# Patient Record
Sex: Female | Born: 1940 | Race: White | Hispanic: No | Smoking: Never smoker
Health system: Southern US, Community
[De-identification: ages and names within clinical notes are randomized; demographics above are authoritative.]

## PROBLEM LIST (undated history)

## (undated) ENCOUNTER — Ambulatory Visit (HOSPITAL_COMMUNITY): Admission: EM | Payer: Medicaid Other

## (undated) DIAGNOSIS — C50919 Malignant neoplasm of unspecified site of unspecified female breast: Secondary | ICD-10-CM

## (undated) DIAGNOSIS — M109 Gout, unspecified: Secondary | ICD-10-CM

## (undated) DIAGNOSIS — I6529 Occlusion and stenosis of unspecified carotid artery: Secondary | ICD-10-CM

## (undated) DIAGNOSIS — I10 Essential (primary) hypertension: Secondary | ICD-10-CM

## (undated) DIAGNOSIS — E119 Type 2 diabetes mellitus without complications: Secondary | ICD-10-CM

## (undated) DIAGNOSIS — M8580 Other specified disorders of bone density and structure, unspecified site: Secondary | ICD-10-CM

## (undated) DIAGNOSIS — E7800 Pure hypercholesterolemia, unspecified: Secondary | ICD-10-CM

## (undated) DIAGNOSIS — E538 Deficiency of other specified B group vitamins: Secondary | ICD-10-CM

## (undated) DIAGNOSIS — M4800 Spinal stenosis, site unspecified: Secondary | ICD-10-CM

## (undated) DIAGNOSIS — I5031 Acute diastolic (congestive) heart failure: Secondary | ICD-10-CM

## (undated) DIAGNOSIS — H3320 Serous retinal detachment, unspecified eye: Secondary | ICD-10-CM

## (undated) DIAGNOSIS — G459 Transient cerebral ischemic attack, unspecified: Secondary | ICD-10-CM

## (undated) DIAGNOSIS — G709 Myoneural disorder, unspecified: Secondary | ICD-10-CM

## (undated) DIAGNOSIS — N189 Chronic kidney disease, unspecified: Secondary | ICD-10-CM

## (undated) HISTORY — DX: Spinal stenosis, site unspecified: M48.00

## (undated) HISTORY — PX: BREAST LUMPECTOMY: SHX2

## (undated) HISTORY — PX: BREAST EXCISIONAL BIOPSY: SUR124

## (undated) HISTORY — DX: Malignant neoplasm of unspecified site of unspecified female breast: C50.919

## (undated) HISTORY — PX: EYE SURGERY: SHX253

## (undated) HISTORY — PX: ANKLE SURGERY: SHX546

## (undated) HISTORY — DX: Gout, unspecified: M10.9

## (undated) HISTORY — PX: SHOULDER ARTHROSCOPY: SHX128

## (undated) HISTORY — DX: Occlusion and stenosis of unspecified carotid artery: I65.29

## (undated) HISTORY — PX: WRIST ARTHROPLASTY: SHX1088

---

## 1997-10-14 ENCOUNTER — Ambulatory Visit (HOSPITAL_COMMUNITY): Admission: RE | Admit: 1997-10-14 | Discharge: 1997-10-14 | Payer: Self-pay | Admitting: *Deleted

## 2000-03-13 ENCOUNTER — Encounter: Payer: Self-pay | Admitting: *Deleted

## 2000-03-13 ENCOUNTER — Ambulatory Visit (HOSPITAL_COMMUNITY): Admission: RE | Admit: 2000-03-13 | Discharge: 2000-03-13 | Payer: Self-pay | Admitting: *Deleted

## 2000-04-02 ENCOUNTER — Encounter: Payer: Self-pay | Admitting: *Deleted

## 2000-04-02 ENCOUNTER — Encounter: Admission: RE | Admit: 2000-04-02 | Discharge: 2000-04-02 | Payer: Self-pay | Admitting: *Deleted

## 2001-04-30 ENCOUNTER — Encounter: Payer: Self-pay | Admitting: *Deleted

## 2001-04-30 ENCOUNTER — Ambulatory Visit (HOSPITAL_COMMUNITY): Admission: RE | Admit: 2001-04-30 | Discharge: 2001-04-30 | Payer: Self-pay | Admitting: *Deleted

## 2001-08-27 ENCOUNTER — Encounter: Payer: Self-pay | Admitting: *Deleted

## 2001-08-27 ENCOUNTER — Emergency Department (HOSPITAL_COMMUNITY): Admission: EM | Admit: 2001-08-27 | Discharge: 2001-08-27 | Payer: Self-pay | Admitting: *Deleted

## 2002-05-01 ENCOUNTER — Ambulatory Visit (HOSPITAL_COMMUNITY): Admission: RE | Admit: 2002-05-01 | Discharge: 2002-05-01 | Payer: Self-pay | Admitting: *Deleted

## 2002-05-01 ENCOUNTER — Encounter: Payer: Self-pay | Admitting: *Deleted

## 2003-06-23 ENCOUNTER — Ambulatory Visit (HOSPITAL_COMMUNITY): Admission: RE | Admit: 2003-06-23 | Discharge: 2003-06-23 | Payer: Self-pay | Admitting: Family Medicine

## 2003-06-23 ENCOUNTER — Ambulatory Visit: Admission: RE | Admit: 2003-06-23 | Discharge: 2003-06-23 | Payer: Self-pay | Admitting: Family Medicine

## 2003-06-24 ENCOUNTER — Ambulatory Visit (HOSPITAL_COMMUNITY): Admission: RE | Admit: 2003-06-24 | Discharge: 2003-06-24 | Payer: Self-pay | Admitting: Family Medicine

## 2004-06-27 ENCOUNTER — Ambulatory Visit (HOSPITAL_COMMUNITY): Admission: RE | Admit: 2004-06-27 | Discharge: 2004-06-27 | Payer: Self-pay | Admitting: Family Medicine

## 2004-07-19 ENCOUNTER — Encounter: Admission: RE | Admit: 2004-07-19 | Discharge: 2004-07-19 | Payer: Self-pay | Admitting: Family Medicine

## 2004-08-26 ENCOUNTER — Inpatient Hospital Stay (HOSPITAL_COMMUNITY): Admission: EM | Admit: 2004-08-26 | Discharge: 2004-09-01 | Payer: Self-pay | Admitting: *Deleted

## 2004-09-01 ENCOUNTER — Inpatient Hospital Stay
Admission: RE | Admit: 2004-09-01 | Discharge: 2004-09-12 | Payer: Self-pay | Admitting: Physical Medicine & Rehabilitation

## 2004-09-01 ENCOUNTER — Ambulatory Visit: Payer: Self-pay | Admitting: Physical Medicine & Rehabilitation

## 2004-11-28 ENCOUNTER — Encounter: Admission: RE | Admit: 2004-11-28 | Discharge: 2004-11-28 | Payer: Self-pay | Admitting: Endocrinology

## 2004-12-01 ENCOUNTER — Encounter (INDEPENDENT_AMBULATORY_CARE_PROVIDER_SITE_OTHER): Payer: Self-pay | Admitting: Specialist

## 2004-12-01 ENCOUNTER — Ambulatory Visit (HOSPITAL_COMMUNITY): Admission: RE | Admit: 2004-12-01 | Discharge: 2004-12-01 | Payer: Self-pay | Admitting: Gastroenterology

## 2005-03-14 ENCOUNTER — Encounter: Admission: RE | Admit: 2005-03-14 | Discharge: 2005-05-03 | Payer: Self-pay | Admitting: Specialist

## 2005-07-10 ENCOUNTER — Encounter: Admission: RE | Admit: 2005-07-10 | Discharge: 2005-07-10 | Payer: Self-pay | Admitting: Family Medicine

## 2005-08-28 ENCOUNTER — Ambulatory Visit (HOSPITAL_COMMUNITY): Admission: RE | Admit: 2005-08-28 | Discharge: 2005-08-28 | Payer: Self-pay | Admitting: Specialist

## 2006-07-11 ENCOUNTER — Encounter: Admission: RE | Admit: 2006-07-11 | Discharge: 2006-07-11 | Payer: Self-pay | Admitting: Family Medicine

## 2006-10-11 ENCOUNTER — Other Ambulatory Visit: Admission: RE | Admit: 2006-10-11 | Discharge: 2006-10-11 | Payer: Self-pay | Admitting: Family Medicine

## 2007-07-17 ENCOUNTER — Encounter: Admission: RE | Admit: 2007-07-17 | Discharge: 2007-07-17 | Payer: Self-pay | Admitting: Endocrinology

## 2008-03-03 ENCOUNTER — Encounter: Admission: RE | Admit: 2008-03-03 | Discharge: 2008-03-03 | Payer: Self-pay | Admitting: Family Medicine

## 2008-03-03 ENCOUNTER — Ambulatory Visit: Payer: Self-pay | Admitting: Vascular Surgery

## 2008-03-03 ENCOUNTER — Encounter (INDEPENDENT_AMBULATORY_CARE_PROVIDER_SITE_OTHER): Payer: Self-pay | Admitting: Family Medicine

## 2008-03-03 ENCOUNTER — Ambulatory Visit (HOSPITAL_COMMUNITY): Admission: RE | Admit: 2008-03-03 | Discharge: 2008-03-03 | Payer: Self-pay | Admitting: Family Medicine

## 2008-08-17 ENCOUNTER — Encounter: Admission: RE | Admit: 2008-08-17 | Discharge: 2008-08-17 | Payer: Self-pay | Admitting: Family Medicine

## 2009-09-01 ENCOUNTER — Encounter: Admission: RE | Admit: 2009-09-01 | Discharge: 2009-09-01 | Payer: Self-pay | Admitting: Family Medicine

## 2010-05-10 ENCOUNTER — Other Ambulatory Visit: Admission: RE | Admit: 2010-05-10 | Discharge: 2010-05-10 | Payer: Self-pay | Admitting: Family Medicine

## 2010-06-12 HISTORY — PX: HEEL SPUR SURGERY: SHX665

## 2010-07-02 ENCOUNTER — Other Ambulatory Visit: Payer: Self-pay | Admitting: Family Medicine

## 2010-07-02 DIAGNOSIS — Z1231 Encounter for screening mammogram for malignant neoplasm of breast: Secondary | ICD-10-CM

## 2010-09-09 ENCOUNTER — Ambulatory Visit
Admission: RE | Admit: 2010-09-09 | Discharge: 2010-09-09 | Disposition: A | Discharge status: Home / Self Care | Payer: Medicare Other | Source: Ambulatory Visit | Attending: Family Medicine | Admitting: Family Medicine

## 2010-09-09 DIAGNOSIS — Z1231 Encounter for screening mammogram for malignant neoplasm of breast: Secondary | ICD-10-CM

## 2010-09-13 ENCOUNTER — Other Ambulatory Visit: Payer: Self-pay | Admitting: Family Medicine

## 2010-09-13 DIAGNOSIS — R928 Other abnormal and inconclusive findings on diagnostic imaging of breast: Secondary | ICD-10-CM

## 2010-09-19 ENCOUNTER — Ambulatory Visit
Admission: RE | Admit: 2010-09-19 | Discharge: 2010-09-19 | Disposition: A | Discharge status: Home / Self Care | Payer: Medicare Other | Source: Ambulatory Visit | Attending: Family Medicine | Admitting: Family Medicine

## 2010-09-19 DIAGNOSIS — R928 Other abnormal and inconclusive findings on diagnostic imaging of breast: Secondary | ICD-10-CM

## 2010-10-28 NOTE — Op Note (Signed)
NAME:  Sabrina Baker, Sabrina Baker NO.:  [id number]   MEDICAL RECORD NO.:  [id number]          PATIENT TYPE:  AMB   LOCATION:  ENDO                         FACILITY:  [facility]   PHYSICIAN:  Bernette Redbird, M.D.   DATE OF BIRTH:  August 28, 1940   DATE OF PROCEDURE:  12/01/2004  DATE OF DISCHARGE:                                 OPERATIVE REPORT   PROCEDURE:  Colonoscopy.   INDICATION:  A 70 year old diabetic female with iron-deficiency anemia  despite heme-negative stool on at least two occasions. Endoscopy basically  negative.   FINDINGS:  Normal exam to the terminal ileum.   PROCEDURE:  The nature, purpose and risks of the procedure had been  discussed with the patient who provided written consent. Sedation was  fentanyl 60 mcg and Versed 7.5 mg IV without arrhythmias or desaturation for  this procedure and the upper endoscopy which preceded it. The Olympus  standard pediatric video colonoscope was easily advanced around the colon to  the cecum and the tip was nubbed into the orifice of the terminal ileum. The  mucosa looked normal. Pullback was then performed. The quality of prep was  excellent and it was felt that all areas were well seen. The appendiceal  orifice was definitely identified.   This was a normal examination. No polyps, cancer, colitis, vascular  malformations or diverticulosis were noted. Retroflexion in the rectum and  pullout through the anal canal demonstrated moderate internal hemorrhoids.  Reinspection of the rectum was unremarkable. No biopsies were obtained. The  patient tolerated the procedure well and there no apparent complications.   IMPRESSION:  Normal exam. No source of iron deficiency anemia endoscopically  evident (280.1).   PLAN:  Consider sigmoidoscopic evaluation in five years for ongoing colon  cancer screening. Concerning the iron deficiency anemia, if the hemoglobin  does not respond appropriately to iron supplementation, consider  possibly  doing a capsule endoscopy of the small bowel.       RB/MEDQ  D:  12/01/2004  T:  12/01/2004  Job:  [id number]   cc:   Stacie Acres. White, M.D.  [address]  [place]  [place] [address]  Fax: [phone]

## 2010-10-28 NOTE — Op Note (Signed)
NAME:  Sabrina Baker, Sabrina Baker NO.:  [id number]   MEDICAL RECORD NO.:  [id number]          PATIENT TYPE:  INP   LOCATION:  0480                         FACILITY:  [facility]   PHYSICIAN:  Kerrin Champagne, M.D.   DATE OF BIRTH:  1940/07/09   DATE OF PROCEDURE:  08/27/2004  DATE OF DISCHARGE:                                 OPERATIVE REPORT   PREOPERATIVE DIAGNOSES:  1.  Right comminuted humeral neck fracture with nondisplaced comminuted      greater tuberosity fracture and a displaced humeral neck fracture.  2.  Left comminuted intra-articular distal radius fracture with associated      ulnar styloid fracture.   POSTOPERATIVE DIAGNOSES:  1.  Right comminuted humeral neck fracture with nondisplaced comminuted      greater tuberosity fracture and a displaced humeral neck fracture.  2.  Left comminuted intra-articular distal radius fracture with associated      ulnar styloid fracture.   PROCEDURE:  1.  Open reduction internal fixation of right humeral neck fracture with a      Synthes short humeral locking plate with four proximal cancellus screws      and three distal cortical screws.  2.  Open reduction internal fixation left distal radius fracture with a      Synthes 8-hole volar locking plate with allograft bone graft and three      distal locking screws.   SURGEON:  Dr. Vira Browns.   ASSISTANT:  Maud Deed, PAC.   ANESTHESIA:  GOT, Dr. Shireen Quan.   ESTIMATED BLOOD LOSS:  100 mL.   DRAINS:  Foley to straight drain, TLS drain left forearm, 7-French   COMPLICATIONS:  None.   BRIEF CLINICAL HISTORY:  This is a 70 year old female with a history of  diabetes, newly recognized anemia, with a hemoglobin of 8.0.  She was seen  in the emergency room after having fallen while walking down her front four  steps, missing the last step, falling and landing on her right shoulder and  left wrist.  Deformity of the left wrist, pain, swelling in the right  shoulder, and the  inability to use her right arm.  Plain radiographs  demonstrate a comminuted right humeral neck fracture with displacement of  the humeral neck and a nondisplaced greater tuberosity fracture.  Radiographs of the left wrist demonstrate a comminuted intra-articular  distal radius fracture.  Following reduction, she had wide displacement of  the intra-articular fracture fragments greater than 3-4 mm.  She is brought  to the operating room to undergo an ORIF of the right humeral neck fracture  with the use of plates and screws to fix this area and ORIF of the left  distal radius fracture with bone grafting if necessary.   INTRAOPERATIVE FINDINGS:  As above.   DESCRIPTION OF PROCEDURE:  After adequate general anesthesia, the patient in  a Schlein shoulder frame elevated, a Foley catheter already placed.  She had  standard preoperative antibiotics of Ancef.  She was draped in the usual  manner for the right shoulder.  Iodine and Vi-Drape was used.  The  incision  for the right shoulder was a standard deltopectoral approach to the right  anterior shoulder, incision in line with the coronoid process, estimated  coracoid process proximally, then aligned with the deltoid tuberosity over  the proximal lateral arm.  Through the skin and subcutaneous layers with a  total length of about 17 cm.  The incision carried to the superficial  fascial layer overlying the deltopectoral groove.  This was then spread to  the clavipectoralis fascia.  This was then incised in line with the skin  incision and the fracture fragments of any identified.  Subperiosteal  dissection then used the patient carefully identify the fracture fragment  pattern and a periosteal elevator used to carefully free up the muscles off  of the proximal humerus to allow for retraction on the fracture fragments in  order to reduce and debride the fracture fragments of interposed soft  tissue.  Curette was used for this purpose.  The greater  tuberosity fracture  fragment was felt to require some stabilization, so then a #5 fiber wire was  passed in a horizontal mattress-type fashion through the anterior portion of  the greater tuberosity fracture fragment, fixing it using the cerclage-type  technique anteriorly.  With this then reduced, the major humeral neck  fracture region was able to be reduced by grasping the distal fracture  fragment of the humeral shaft and manipulating it laterally, reducing it  into place.  A short humeral locking plate was then carefully aligned  against the lateral anterior aspect of the humeral neck in line with the  greater tuberosity. Using the superior pin, this was pinned into place and  distally was held in place with a verbuge bone-holding forceps.   Drill holes were then placed distally, locking the plate to the distal  fracture fragments.  Drill holes were then placed in each of the four  proximal holes of the plate using the centering guide provided.  And then  the cancellus screws, 4 in total were then placed into the proximal portion  of the plate, fixing the plate to the humeral head.  C-arm fluoro was used  during the case to ascertain correct position alignment of each of the  screws placed and to ensure that now the screws were allowed to be intra-  articular in their position. When this was completed, permanent images were  obtained in the AP and lateral planes for documentation purposes.  Irrigation was then performed at the fracture site.   Following irrigation, the soft tissues allowed to fall back into place.  There was no active bleeding present at this point, so the subcu layers were  then carefully approximated after approximating the superficial fascial  layer of the deltopectoral groove.  The subcu layers approximated with  interrupted 2-0 Vicryl sutures.  The deltopectoral groove approximated without interrupted 0 Vicryl sutures, and the skin was then closed with   stainless steel staples.  The right shoulder then dressed with Adaptic 4x4s,  ABD pad, ABD pad placed into the right axillary area, the incision dressing  then affixed to the skin with Hypafix tape.  The patient then placed into a  right shoulder immobilizer, the head returned to a supine position.  After  careful maintenance of the airway, the operating room table was then rotated  to allow for the left arm to be brought out to the side using radiopaque  hand table.  Left upper extremity then had a tourniquet placed about left  upper arm  and was then prepped with DuraPrep solution from the fingertips to  the left elbow and above the elbow to the tourniquet level.  Draped in the  usual manner.   The left upper extremity was then marked out for a volar approach to the  left distal forearm, and this would be along the patient's flexor carpi  radialis longus tendon.  The incision made along this line after first  elevation, the arm exsanguination with Esmarch bandage.  Tourniquet inflated  to 120 mmHg, the incision through skin and subcu layers overlying the  patient's extensor carpi radialis longus, down through skin and to the  peritenon enveloping this tendon.  The peritenon was then incised.  Tendon  then subluxed ulnarward.  The patient's pronator quadratus then incised over  the distal portions of the radius and a cuff of tissue preserved over the  radial aspect of the incision for reapproximation later.  Pronator quadratus  then carefully elevated off the volar surface of the distal radius,  including distal radial fracture fragments and the distal radius fragment  proximal to the fracture site.  When this was completed, then retraction was  able to be carried out.  The fingers were placed into sterile Chinese finger  traps with 5 pound weight off the end of the table using a sterile  technique.  Using the mini C-arm, then fracture was reduced.   Bone graft was then applied to the  fracture site by using a freer elevator  within the fracture site to open it up.  Then portions of cortical cancellus  chips of bone graft were placed within the fracture site in order to bone  graft the area of impacted fracture dorsally.  A significant amount of bone  graft was used; almost a full teaspoon and half was placed into the fracture  site.  With this, then the fracture was manipulated, and a Steinmann pin  0.625 was then driven through the radial styloid, fixing the radial styloid  and reducing the intra-articular fracture fragment.  With the fracture  reduced, then an 8-hole volar buttress plate was then carefully placed over  the distal portion of the radius and aligned appropriately.  Drill hole then  placed into the adjustment slot, into the radius proximal to the fracture  site after adjusting the plate distally.  Screw was then used to anchor the proximal portion of the plate.  Drill holes were then introduced, using the  second from the most radial aspect of the distal locking screw holes.  Using  the appropriate drill sleeve, drilling with the 2.0 drill and then using the  appropriate size locking the screws, a screw was placed into the radial  styloid, obtaining excellent purchase here.  Two additional screws were then  placed into the mid and lateral portions of the distal radius, grasping the  lunate fossa fracture fragments and reducing them quite nicely.  This  completed, permanent images were obtained using the mini C-arm.  The two  additional cortical screws were placed in the proximal portion of the plate,  fixing it to the proximal radius fracture fragments.  This completed,  permanent images again obtained in both AP and lateral planes, demonstrating  good alignment and position here.  Bending the plate over the radial aspect,  a pair of specially designed pliers by [employer] was used to carefully bend  the plate to contour it to the distal radius, so it was not  prominent.  Following this, then tourniquet was  released and bleeders controlled using  bipolar electrocautery.  The pronator quadratus muscle was then approximated  as best as possible over the plate from ulnar to radius, radial, and  approximated with 2-0 Vicryl sutures.  A 7-French TLS drain exiting out the  volar aspect of the forearm proximally was then placed in the depth of the  incision.  Subcu layers were then approximated with interrupted 3-0 Vicryl  sutures and the skin closed with stainless steel staples.  Adaptic 4x4s held  to the volar forearm using sterile Webril.  Then a well-padded sugar-tong  splint was applied to the left forearm.  Tourniquet was released with a  total tourniquet time of 57 minutes.  The patient had release of the Chinese  finger traps before closing the incision.  The pin used for temporary  fixation of the distal radius fracture site was removed at the end of the  case.  The patient was then reactivated, extubated, and returned to the  recovery room in satisfactory condition.  All instrument and sponge counts  were correct.      JEN/MEDQ  D:  08/27/2004  T:  08/27/2004  Job:  [id number]

## 2010-10-28 NOTE — Consult Note (Signed)
NAME:  Sabrina Baker, Sabrina Baker NO.:  [id number]   MEDICAL RECORD NO.:  [id number]          PATIENT TYPE:  INP   LOCATION:  0101                         FACILITY:  [facility]   PHYSICIAN:  Deirdre Peer. Polite, M.D. DATE OF BIRTH:  1940-12-20   DATE OF CONSULTATION:  DATE OF DISCHARGE:                                   CONSULTATION   CHIEF COMPLAINT:  Fall.   HISTORY OF PRESENT ILLNESS:  A 70 year old female with a known history of  diabetes who presents to the ED after sustaining a fall on an outpatient  basis.  Patient has been seen by an orthopedist secondary to multiple  fractures, which include right proximal humerus fracture and a left  comminuted wrist fracture.  On further evaluation, the patient was found to  have significant anemia and a hemoglobin of 8.  Therefore, [facility] were called for further evaluation.  At the time of my  evaluation, the patient is alert and oriented x3.  Patient does not recall  any history of anemia.  Denies any hematemesis, hemoptysis.  No blood per  rectum.  No black stools.  Patient does admit to having a history of  menorrhagia in the past; however, has had a hysterectomy in 1973 secondary  to that problem.  Patient does remember taking any iron supplements.  Again,  patient denies any history of blood per rectum and does not know of having a  history of anemia.  Patient does admit to having some NSAIDs use, quantity  twice daily, over the last week and since then probably once a week for  ankle discomfort.  Patient is being admitted by orthopedist for management  and treatment of fractures.   PAST MEDICAL HISTORY:  As stated above.  Significant for diabetes, chronic  lower extremity edema by history; however, on exam, there is no edema.  Hypertension.   MEDICATIONS ON ADMISSION:  1.  Glucotrol XL 10 mg b.i.d.  2.  Glucophage 1 gm b.i.d.  3.  Avandia 4 mg q.d.  4.  Diovan 80 mg q.d.  5.  Lasix 80 mg q.d.  6.  Cardura 10/20  mg q.d.  7.  Aspirin 81 mg q.d.   SOCIAL HISTORY:  Negative for tobacco, alcohol, or drugs.   PAST SURGICAL HISTORY:  Significant for hysterectomy in 1973.   FAMILY HISTORY:  Noncontributory.   ALLERGIES:  No known drug allergies.   REVIEW OF SYSTEMS:  As stated in the HPI.  Patient denies any chest pain,  shortness of breath.  No nausea or vomiting.  No diarrhea.  No constipation.  No palpitations.  No weight loss.  No weight gain.   PHYSICAL EXAMINATION:  VITALS:  Temp 97.7, BP 147/64, pulse 79, respiratory  rate 18, sat of 96%.  HEENT:  Significant for pale sclerae.  No oral lesions.  NECK:  No nodes.  No JVD.  CHEST:  Clear to auscultation bilaterally.  CARDIOVASCULAR:  Regular S2.  No S3.  There is a systolic murmur in the left  sternal border, probably flow murmur.  ABDOMEN:  Soft and nontender.  No hepatosplenomegaly.  RECTAL:  Heme negative.  EXTREMITIES:  No edema.  Pale nail beds.  NEUROLOGIC:  Nonfocal.   DATA:  CBC:  White count 13.5, hemoglobin 8, hematocrit 24, MCV 74.4, RDW  17.7, platelets 551, neutrophil count 93%, INR 0.8.  Sodium 131, potassium  3.8, chloride 100, carbon dioxide 26.  BUN 16, creatinine 1.1, calcium 7.8,  total protein 5.8, albumin 2.7.  AST and ALT 24 and 14, respectively.  UA  specific gravity 1.007, LE and nitrite negative.   X-RAY:  Imaging shows mildly displaced comminuted fracture of the proximal  right humerus.  Also angulated Colles fracture, nondisplaced fracture of the  ulnar styloid of the left wrist.   Chest x-ray:  Mild basilar atelectasis.   ASSESSMENT:  1.  Proximal humeral fracture and left wrist fracture.  Treatment per ortho.  2.  Anemia with low MCV.  Patient is status post hysterectomy in 1973      secondary to menorrhagia.  Patient denies any known history of anemia.  3.  Diabetes.  4.  Lower extremity edema by history; however, none on exam.   Recommend patient be typed and crossed 2 units packed red blood cells  for  transfusion.  Check TSH. Check iron studies, B12 and folate.  Heme-check  stools.  Obtain chest x-ray, which has been done.  PAS hose and PPI.  Will  make further recommendations as deemed necessary.  Patient may also need  iron supplementation.      RDP/MEDQ  D:  08/26/2004  T:  08/26/2004  Job:  [id number]

## 2010-10-28 NOTE — Op Note (Signed)
NAME:  Sabrina, Baker NO.:  [id number]   MEDICAL RECORD NO.:  [id number]          PATIENT TYPE:  AMB   LOCATION:  ENDO                         FACILITY:  [facility]   PHYSICIAN:  Bernette Redbird, M.D.   DATE OF BIRTH:  [birth year]   DATE OF PROCEDURE:  12/01/2004  DATE OF DISCHARGE:                                 OPERATIVE REPORT   PROCEDURE:  Upper endoscopy with biopsies.   INDICATIONS:  A 70 year old diabetic patient with iron deficiency anemia,  but heme-negative stool on at least two occasions.   FINDINGS:  Minimal erythematous change in the antrum of the stomach,  possibly related to aspirin therapy. No erosions or ulcers seen.   PROCEDURE:  The nature, purpose, and risks of the procedure been discussed  with the patient, who provided written consent. Sedation was fentanyl 40 mcg  and Versed 4 milligrams IV without arrhythmias or desaturation. The Olympus  video endoscope was passed under direct vision. The vocal cords looked  completely normal. The esophagus was readily entered and had normal mucosa  without evidence of reflux esophagitis, Barrett's esophagus, varices,  infection or neoplasia and no ring stricture or hiatal hernia was  appreciated. The stomach contained no significant residual. The antrum of  the stomach had a few mucosal hemorrhages but no erosions, just a little bit  of faint erythema but no ulcers or significant gastritis, no polyps or  masses. Retroflexed viewing of the cardia was normal and the pylorus,  duodenal bulb and second duodenum looked normal. The mucosal biopsies of the  second duodenum were obtained to rule out celiac disease prior to removal of  the scope. The patient tolerated the procedure well and there no apparent  complications.   IMPRESSION:  Essentially normal endoscopy with minimal antral erythema  consistent with history of aspirin exposure, but nothing to account for the  patient's iron-deficiency anemia  (280.1).   PLAN:  Proceed to colonoscopic evaluation.       RB/MEDQ  D:  12/01/2004  T:  12/01/2004  Job:  [id number]   cc:   Stacie Acres. White, M.D.  [address]  [place]  [place] [address]  Fax: [phone]

## 2010-10-28 NOTE — Op Note (Signed)
NAME:  Sabrina Baker, Sabrina Baker NO.:  [id number]   MEDICAL RECORD NO.:  [id number]          PATIENT TYPE:  AMB   LOCATION:  SDS                          FACILITY:  [facility]   PHYSICIAN:  Kerrin Champagne, M.D.   DATE OF BIRTH:  1941/02/16   DATE OF PROCEDURE:  08/28/2005  DATE OF DISCHARGE:  08/28/2005                                 OPERATIVE REPORT   PREOPERATIVE DIAGNOSIS:  Right humeral neck fracture, comminuted, status  post open reduction and internal fixation with a Synthes locking plate with  two proximal locking screws penetrating the anterior joint surface.   POSTOPERATIVE DIAGNOSIS:  Right humeral neck fracture, comminuted, status  post open reduction and internal fixation with a Synthes locking place with  two proximal locking screws penetrating the anterior joint surface.   PROCEDURE:  Removal of two proximal locking screws, right humeral neck ORIF.   SURGEON:  Kerrin Champagne, MD.   ASSISTANT:  Wende Neighbors, PA-C.   ANESTHESIA:  GOT, Dr. Sheldon Silvan, supplemented with local infiltration with  Marcaine, half-percent, with 1:200,000 epinephrine, 10 ml.   BRIEF CLINICAL HISTORY:  This patient is a 70 year old, right hand dominant  female.  She fell nearly a year ago sustaining injuries to her left distal  radius and right proximal humerus, a three-part humeral neck fracture able  to treated with an open reduction and internal fixation with a Synthes  locking plate, and a left distal radius fracture treated with volar buttress  plate and bone grafting.  The patient did well.  She has a history of  diabetes, bilateral Charcot foot deformities, eventually placed on insulin.  She went on to heal both her right humeral neck fracture and left distal  radius fracture.  Followup radiographs, however, demonstrate that there has  been settling in the fracture site of the proximal humerus and the two  proximal locking screws used for internal fixation of the right  proximal  humeral neck are now protruding through the anterior surface of the humeral  head in a position that could impinge on the glenohumeral joint.  She was  brought to the operating room to undergo removal of these two locking screws  percutaneously.   INTRAOPERATIVE FINDINGS:  Unable to remove the screws proximally as, even  with engaging the screw heads with the screwdriver, the screws would not  allow for backing out.  Instead, they continued to spin.  Attempts at  grasping the screws through a separate stab incision were unsuccessful so  then an open approach was made using a mini incision approximately 2.5 cm  overlying the proximal portion of the plate.   DESCRIPTION OF PROCEDURE:  After adequate general anesthesia, the patient in  a supine position, a bump under the right shoulder, the right shoulder off  the side of the table, a standard prep with DuraPrep solution, the standard  preoperative antibiotics, draped in the usual manner.   The mini C-arm brought into the field.  Under the mini C-arm, a small stab  incision was made over the proximal aspect of the plate and a  hemostat used  to spread the subcu tissues down to the plate.  A screwdriver was then used  to approximate the plate and to engage the anterior screw on the plate  proximally, the locking screw felt to be prominent, and with attempts at  backing this out, the screw was felt to be clicking as it had backed out to  a point where it would no longer back out due to the threads of the screw no  longer engaging the plate and also not able to engage bone in order to allow  for the screw to be backed out.  A separate stab incision was then made  anteriorly in the old incision scar in the deltopectoral groove, and a  tonsil clamp passed over to the plate in an attempt to grasp the screw and  to back it out with pressure on the base of the screw at the plate.  This  was unsuccessful in being able to manipulate the  screw and to allow for its  removal percutaneously so that this was abandoned, a one-inch incision then  made over the previous stab incision over the proximal aspect of the plate  over the lateral shoulder.  An incision through skin and subcutaneous layers  carried directly down to the patient's deltoid fascia.  This was incised in  line with the skin incision and then spread bluntly and incised down to the  plate.  The plate had a bursa-like covering, which was incised with a #15  blade scalpel and then spread using a wooden handled periosteal elevator,  exposing the heads of the screws.  The anterior screw showing loosening was  able to be grasped with a hemostat and removed.  The posterior of the two  proximal screws was then able to be engaged with a screwdriver and removed  without difficulty.  Irrigation was then performed.  Careful inspection  demonstrated no bleeding present.  The C-arm fluoro was used to perform a  permanent image demonstrating the removed proximal screws.  After further  irrigation, then the incision was closed by approximating the deltoid fascia  with interrupted 2-0 Vicryl sutures, the deep subcu layers with interrupted  2-0 Vicryl sutures, the skin closed with a running subcu stitch of 4-0  Vicryl.  The anterior stab incision was closed with a single subcu suture of  4-0 Vicryl.  Tincture of Benzoin and Steri-Strips applied, and 4x4s affixed  to the skin with Hypafix tape.  The patient then reactivated, extubated, and  returned to the recovery room in satisfactory condition.  All instrument and  sponge counts were correct.   POSTOPERATIVE CARE:  The patient will be discharged to the Short Stay Unit,  Vicodin for discomfort, and Robaxin for spasm.  Return appointment for  followup in two weeks.      Kerrin Champagne, M.D.  Electronically Signed     JEN/MEDQ  D:  08/28/2005  T:  08/28/2005  Job:  [id number]

## 2010-10-28 NOTE — Discharge Summary (Signed)
NAME:  Sabrina Baker, CARTE NO.:  [id number]   MEDICAL RECORD NO.:  [id number]          PATIENT TYPE:  ORB   LOCATION:  4527                         FACILITY:  [facility]   PHYSICIAN:  Mariam Dollar, P.A.  DATE OF BIRTH:  1940/10/22   DATE OF ADMISSION:  09/01/2004  DATE OF DISCHARGE:  09/12/2004                                 DISCHARGE SUMMARY   DISCHARGE DIAGNOSES:  1.  Right comminuted humeral neck fracture status post open reduction      internal fixation August 27, 2004.  2.  Left comminuted intraarticular distal radius fracture status post open      reduction internal fixation August 27, 2004, pain management.  3.  Non-insulin-dependent diabetes mellitus.  4.  Anemia.  5.  Hypertension.  6.  Multiple mid foot fractures, left foot and probable Charcot-type changes      right foot.   HISTORY OF PRESENT ILLNESS:  This is a 70 year old white female with history  of diabetes mellitus admitted [facility] August 26, 2004, after  a fall with right shoulder and left wrist pain.  Sustained a right  comminuted humeral neck fracture with a nondisplaced comminuted greater  tuberosity fracture and a displaced humeral neck fracture.  Also, with a  left comminuted intraarticular distal radius fracture with associated ulnar  styloid fracture.  Underwent open reduction internal fixation of right  humeral neck fracture and open reduction internal fixation of left distal  radius fracture March 18 per Dr. Otelia Sergeant.  Advised nonweightbearing right  upper extremity, nonweightbearing left wrist.  Noted anemia 8.0 on  admission, transfused two units of packed red blood cells preoperatively  March 17, with hemoglobin improved to 9.6.  Patient monitored well on iron  supplement.  She did have some left foot swelling monitored per Orthopedic  Services, questionable early cellulitis, placed on empiric Cipro.  She was  admitted to Subacute Care Services.   PAST MEDICAL HISTORY:  See  discharge diagnoses.   ALLERGIES:  None.   SOCIAL HISTORY:  Denies alcohol or tobacco.  Lives alone, two level home,  stays on the first floor.  Daughter to assist on discharge.  Son plans to  add a ramp to the home if needed.   MEDICATIONS PRIOR TO ADMISSION:  1.  Glucotrol XL 10 mg b.i.d.  2.  Glucophage 1000 mg b.i.d.  3.  Avandia 4 mg daily.  4.  Diovan 80 mg daily.  5.  Lasix 80 mg daily.  6.  Cardura 10/20 daily.  7.  Aspirin 81 daily.   HOSPITAL COURSE:  Patient with progressive gains while on Rehab Services  with therapies initiated daily.  The following issues were followed during  the patient's rehab course.  Pertaining to Ms. Vanderloop's right comminuted  humeral neck fracture, left comminuted intraarticular distal radius  fracture, she had undergone open reduction internal fixation March 18 per  Dr. Otelia Sergeant.  Staples had been removed.  No signs of infection.  Nonweightbearing right upper extremity, as well as, left wrist.  Light  pendulum exercises were ordered for right upper extremity.  Ongoing  therapies had been advised.  Pain control ongoing with the use of Oxycodone.  Noted patient with some increased left foot pain, felt to be secondary to  early cellulitis early on.  This was afebrile.  She had been placed on  empiric Cipro.  This had been later discontinued.  Left foot three view  films per Dr. Otelia Sergeant April 1, showed multiple mid foot fractures across the  bases of the second, third, fourth and fifth mid tarsals.  She was fitted  with a 3-D foot brace to be on when out of bed.  Also, right foot films had  shown some early probable Charcot changes.  This would be addressed for  Orthopedic Services.  Her blood sugars remained acceptable, 91, 161, 110, as  she continued on her Glucophage 1000 mg b.i.d. and Glucotrol 10 mg daily.  Blood sugars seemed to normalize as her appetite improved.  Blood pressures  controlled with Tenormin, Lasix and Avapro.  Overall, for her  functional  mobility, she was contact guard assist 175 feet with a rolling walker,  minimal assistance to contact guard for bed mobility and transfers needing  some assistance for lower body dressing.  Home Health therapies had been  advised.   LABORATORY DATA:  Latest labs showed a sodium 135, potassium 4.7, BUN 27,  creatinine 1.2, hemoglobin 8.7, hematocrit 26.9.   DISCHARGE MEDICATIONS:  1.  Tenormin 50 mg daily.  2.  Ferrous sulfate 325 t.i.d.  3.  Lasix 80 mg daily.  4.  Avapro 300 mg daily.  5.  Glucophage 1000 mg b.i.d.  6.  Protonix 40 mg daily.  7.  Glucotrol 10 mg b.i.d.  8.  Multivitamin daily.  9.  Oxycodone as needed for pain.   DISCHARGE INSTRUCTIONS:  1.  Activity:  Nonweightbearing right upper extremity, nonweightbearing left      wrist, light pendulum exercises      right shoulder, 3D boot to left lower extremity when ambulating.  2.  Special Instructions:  Home Health therapies.   FOLLOWUP:  The patient should follow up with Dr. Otelia Sergeant as advised, Dr  Laurann Montana for medical management.      DA/MEDQ  D:  09/12/2004  T:  09/12/2004  Job:  [id number]   cc:   Stacie Acres. White, M.D.  [address]  [place]  [place] [address]  Fax: [phone]   Kerrin Champagne, M.D.  [address]  [place]  [place] [address]  Fax: [phone]

## 2010-10-28 NOTE — Discharge Summary (Signed)
NAME:  Sabrina, Baker NO.:  [id number]   MEDICAL RECORD NO.:  [id number]          PATIENT TYPE:  INP   LOCATION:  0480                         FACILITY:  [facility]   PHYSICIAN:  Kerrin Champagne, M.D.   DATE OF BIRTH:  1941/04/02   DATE OF ADMISSION:  08/26/2004  DATE OF DISCHARGE:  09/01/2004                                 DISCHARGE SUMMARY   ADMISSION DIAGNOSES:  1.  Left impacted interarticular fracture of the distal radius.  2.  Right humeral neck fracture.  3.  Type 2 diabetes mellitus.  4.  Hypertension.  5.  Anemia on admission, questionable new onset.  6.  A history of peripheral edema.  7.  Dyslipidemia.  8.  Gastroesophageal reflux disease.  9.  Status post abdominal hysterectomy.   DISCHARGE DIAGNOSES:  1.  Right comminuted humeral neck fracture with nondisplaced comminuted      greater tuberosity fracture and a displaced humeral neck fracture.  2.  Left comminuted interarticular distal radius fracture with associated      ulnar styloid fracture.  3.  Type 2 diabetes mellitus.  4.  Hypertension.  5.  Anemia on admission, questionable new onset.  6.  A history of peripheral edema.  7.  Dyslipidemia.  8.  Gastroesophageal reflux disease.  9.  Status post abdominal hysterectomy.  10. Iron deficiency anemia.  11. B12 deficiency anemia.  12. Urinary tract infection.   PROCEDURES:  1.  On August 27, 2004, the patient underwent open reduction internal      fixation of right humeral neck fracture.  2.  Open reduction internal fixation of left distal radius fracture.  This      was performed by Dr. Otelia Sergeant, assisted by Wende Neighbors, P.A., under      general anesthesia.   CONSULTATIONS:  Dr. Nehemiah Settle and associates for [facility] hospitalist.   BRIEF HISTORY:  The patient is a 70 year old white female with a history of  diabetes and new onset of anemia.  The patient presented to the emergency  room after falling, walking down her steps at home.  She had  immediate onset  of pain and deformity of the left wrist and the right shoulder.  She had  inability to use the right arm.  In the emergency room, radiographs  demonstrated a comminuted right humeral neck fracture with displacement of  the humeral neck and a displaced greater tuberosity fracture.  Also x-rays  demonstrated left wrist comminuted intraarticular distal radius fracture.  Reductio of the left wrist was done in the emergency room, and postreduction  films showed wide displacement of the interarticular fracture fragments  greater than 3-4 mm of the distal radius.  It was felt that she would  require surgical intervention and was admitted for the procedures as stated  above.   BRIEF HOSPITAL COURSE:  Upon admission, the patient was seen by Dr. Nehemiah Settle.  She underwent transfusion of 2 units of packed red blood cells.  Anemia  evaluation was started.  The patient was started on a proton pump inhibitor  also for her gastroesophageal reflux disease.  Following the procedure, the  patient had neurovascular and motor function intact to both upper  extremities.  The patient was placed in a long arm splint to the left upper  extremity.  She was placed in a shoulder immobilizer to the right upper  extremity.  Occupational therapy was initiated on the first postoperative  day.  The patient had significant difficulty with bed to chair transfers, as  she complained of being off balance secondary to edema in both of her feet.  She required maximum assistance for bed to chair transfers.  She had no  weightbearing restrictions on her lower extremities, but on the upper  extremities she was not allowed weightbearing.  The patient was felt to most  likely require nursing home placement versus subacute care unit prior to  returning home, as she was having significant difficulty with activity and  would be alone 100% of the time at home.  Arrangements were started in  finding the patient a suitable  facility early on in the hospital stay.  Her  anemia was evaluated by the consulting medical physicians.  She was noted to  have a B12 deficiency as well as iron deficiency and was treated with oral  iron supplementation and vitamin B12 1000 mg daily.  Dressing changes were  done daily to the right shoulder.  The wound was found to be healing well.  The left wrist splint remained intact throughout the hospital stay, and the  patient was moving her fingers well in both upper extremities.  For her  peripheral edema, Lasix was restarted.  She did have a couple of episodes of  hypertension.  Adjustments in medication were made to assist with this.  The  patient was noted to have a urinary tract infection on a urinalysis of August 31, 2004.  She was started on Cipro 250 mg p.o. b.i.d. for 3 days.  Eventually a bed was made available at subacute care unit at [facility].  She and her family agreed this would be a suitable place for her to rehab.  She was transferred on September 01, 2004 in stable condition.  It was  recommended by the medical physicians consulting that she undergo outpatient  colonoscopy in regards to her anemia.  The patient was advised of such.   PERTINENT LABORATORY VALUES:  The patient received 2 units of packed red  blood cells during the hospital stay.  On admission, CBC with WBC 13.5,  hemoglobin 8, hematocrit 24.8.  Prior to discharge on March 23, WBC 10,  hemoglobin 9.6, hematocrit 29.8.  Stools were negative for occult blood.  Coagulation studies on admission were within normal limits.  Chemistry  studies on admission were sodium 131, glucose 247, calcium 7.8, total  protein 5.8, albumin 2.7, ALP 132 with remaining values normal.  Glucose was  noted to be elevated on Bmets throughout the hospital stay, ranging from 247  on admission to 147 at discharge.  TSH was normal.  Anemia studies show iron  of 13, TIBC 373, percent sat 3, B12 137, ferritin 10, folate 12.1.   The urinalysis on admission was negative.  Repeat on August 30, 2004 had small  leukocyte esterase, few epithelial cells, 3-6 WBCs and few bacteria.   EKG on admission with normal sinus rhythm.  Chest x-ray on 08/26/2004 showed  mild basilar atelectasis.  No other acute chest findings.  The patient also  underwent a CT scan of the right shoulder, showing mildly displaced  comminuted fracture of  the proximal right humerus.  No evidence of  associated dislocation or scapular injury.  A CT of the left wrist with  angulated Colles fracture with nondisplaced fracture of the ulnar styloid.   PLAN:  The patient was transferred to subacute care unit.  There she will  continue to receive physical therapy for ambulation and gait training.  She  is allowed weightbearing as tolerated on the lower extremities and  nonweightbearing on both upper extremities.  She will receive occupational  therapy for ADLs.  The patient will continue on medications as taken during  this admission, and a list has been sent with her for continuation of her  medications.  Dressing change should be done daily to the right shoulder.  The left wrist will be changed into a short  arm cast 2 weeks from the date of her surgery.  The patient will be seen in  Dr. Barbaraann Faster office following her stay at the rehab unit.  If there are  questions or concerns regarding her care, please notify his office at [phone].   CONDITION ON DISCHARGE:  Stable.      SMV/MEDQ  D:  10/17/2004  T:  10/17/2004  Job:  [id number]

## 2010-12-29 ENCOUNTER — Other Ambulatory Visit (HOSPITAL_COMMUNITY): Payer: Medicare Other

## 2010-12-29 ENCOUNTER — Encounter (HOSPITAL_COMMUNITY)
Admission: RE | Admit: 2010-12-29 | Discharge: 2010-12-29 | Disposition: A | Discharge status: Home / Self Care | Payer: Medicare Other | Source: Ambulatory Visit | Attending: Orthopedic Surgery | Admitting: Orthopedic Surgery

## 2010-12-29 ENCOUNTER — Other Ambulatory Visit (HOSPITAL_COMMUNITY): Payer: Self-pay | Admitting: Orthopedic Surgery

## 2010-12-29 ENCOUNTER — Ambulatory Visit (HOSPITAL_COMMUNITY)
Admission: RE | Admit: 2010-12-29 | Discharge: 2010-12-29 | Disposition: A | Discharge status: Home / Self Care | Payer: Medicare Other | Source: Ambulatory Visit | Attending: Orthopedic Surgery | Admitting: Orthopedic Surgery

## 2010-12-29 DIAGNOSIS — Z01812 Encounter for preprocedural laboratory examination: Secondary | ICD-10-CM | POA: Insufficient documentation

## 2010-12-29 DIAGNOSIS — Q6689 Other  specified congenital deformities of feet: Secondary | ICD-10-CM

## 2010-12-29 DIAGNOSIS — Z01810 Encounter for preprocedural cardiovascular examination: Secondary | ICD-10-CM | POA: Insufficient documentation

## 2010-12-29 DIAGNOSIS — Z01818 Encounter for other preprocedural examination: Secondary | ICD-10-CM | POA: Insufficient documentation

## 2010-12-29 DIAGNOSIS — I10 Essential (primary) hypertension: Secondary | ICD-10-CM | POA: Insufficient documentation

## 2010-12-29 LAB — COMPREHENSIVE METABOLIC PANEL
AST: 12 U/L (ref 0–37)
CO2: 26 mEq/L (ref 19–32)
Chloride: 101 mEq/L (ref 96–112)
GFR calc non Af Amer: 29 mL/min — ABNORMAL LOW (ref >60)
Glucose, Bld: 139 mg/dL — ABNORMAL HIGH (ref 70–99)
Sodium: 140 mEq/L (ref 135–145)

## 2010-12-29 LAB — CBC
HCT: 32.4 % — ABNORMAL LOW (ref 36.0–46.0)
MCV: 94.5 fL (ref 78.0–100.0)
Platelets: 530 10*3/uL — ABNORMAL HIGH (ref 150–400)
RBC: 3.43 MIL/uL — ABNORMAL LOW (ref 3.87–5.11)

## 2010-12-29 LAB — APTT: aPTT: 28 seconds (ref 24–37)

## 2010-12-29 LAB — PROTIME-INR: INR: 0.91 (ref 0.00–1.49)

## 2010-12-29 LAB — SURGICAL PCR SCREEN: MRSA, PCR: NEGATIVE

## 2010-12-30 ENCOUNTER — Ambulatory Visit (HOSPITAL_COMMUNITY)
Admission: RE | Admit: 2010-12-30 | Discharge: 2011-01-03 | Disposition: A | Discharge status: Home / Self Care | Payer: Medicare Other | Source: Ambulatory Visit | Attending: Orthopedic Surgery | Admitting: Orthopedic Surgery

## 2010-12-30 DIAGNOSIS — I10 Essential (primary) hypertension: Secondary | ICD-10-CM | POA: Insufficient documentation

## 2010-12-30 DIAGNOSIS — K219 Gastro-esophageal reflux disease without esophagitis: Secondary | ICD-10-CM | POA: Insufficient documentation

## 2010-12-30 DIAGNOSIS — E1149 Type 2 diabetes mellitus with other diabetic neurological complication: Secondary | ICD-10-CM | POA: Insufficient documentation

## 2010-12-30 DIAGNOSIS — S92009A Unspecified fracture of unspecified calcaneus, initial encounter for closed fracture: Secondary | ICD-10-CM | POA: Insufficient documentation

## 2010-12-30 DIAGNOSIS — Z01812 Encounter for preprocedural laboratory examination: Secondary | ICD-10-CM | POA: Insufficient documentation

## 2010-12-30 LAB — GLUCOSE, CAPILLARY
Glucose-Capillary: 183 mg/dL — ABNORMAL HIGH (ref 70–99)
Glucose-Capillary: 215 mg/dL — ABNORMAL HIGH (ref 70–99)

## 2010-12-31 LAB — GLUCOSE, CAPILLARY: Glucose-Capillary: 170 mg/dL — ABNORMAL HIGH (ref 70–99)

## 2011-01-01 LAB — GLUCOSE, CAPILLARY
Glucose-Capillary: 192 mg/dL — ABNORMAL HIGH (ref 70–99)
Glucose-Capillary: 251 mg/dL — ABNORMAL HIGH (ref 70–99)
Glucose-Capillary: 185 mg/dL — ABNORMAL HIGH (ref 70–99)

## 2011-01-02 LAB — GLUCOSE, CAPILLARY
Glucose-Capillary: 186 mg/dL — ABNORMAL HIGH (ref 70–99)
Glucose-Capillary: 239 mg/dL — ABNORMAL HIGH (ref 70–99)

## 2011-01-03 LAB — POCT I-STAT 4, (NA,K, GLUC, HGB,HCT)
Glucose, Bld: 147 mg/dL — ABNORMAL HIGH (ref 70–99)
HCT: 30 % — ABNORMAL LOW (ref 36.0–46.0)
Hemoglobin: 10.2 g/dL — ABNORMAL LOW (ref 12.0–15.0)
Potassium: 5 mEq/L (ref 3.5–5.1)
Sodium: 139 mEq/L (ref 135–145)

## 2011-01-03 LAB — GLUCOSE, CAPILLARY: Glucose-Capillary: 174 mg/dL — ABNORMAL HIGH (ref 70–99)

## 2011-01-11 NOTE — Op Note (Signed)
  NAME:  Sabrina Baker, Sabrina Baker NO.:  [id number]  MEDICAL RECORD NO.:  [id number]  LOCATION:  5033                         FACILITY:  [facility]  PHYSICIAN:  Nadara Mustard, MD     DATE OF BIRTH:  01/07/1941  DATE OF PROCEDURE:  12/30/2010 DATE OF DISCHARGE:                              OPERATIVE REPORT   PREOPERATIVE DIAGNOSIS:  Charcot left calcaneus fracture.  POSTOPERATIVE DIAGNOSIS:  Charcot left calcaneus fracture.  PROCEDURE:  Open reduction internal fixation of left calcaneus.  SURGEON:  Nadara Mustard, MD  ANESTHESIA:  General.  ESTIMATED BLOOD LOSS:  Minimal.  ANTIBIOTICS:  Vancomycin 1 g.  DRAINS:  None.  COMPLICATIONS:  None.  TOURNIQUET TIME:  None.  DISPOSITION:  To PACU in stable condition.  INDICATIONS FOR PROCEDURE:  The patient is a 70 year old woman with diabetes who sustained a nondisplaced calcaneus fracture.  She underwent good initial conservative treatment, was placed in a cast, returned in 1 week and had displacement of the tongue type calcaneus fracture despite cast immobilization.  She presents at this time after displacement of the calcaneus fracture for open reduction internal fixation due to the risk of skin breakdown, due to the elevation of the os calcis.  Risks and benefits of surgery were discussed including infection, neurovascular injury, nonhealing of the skin, nonhealing of the bone, failure of the hardware, need for additional surgery.  The patient states he understands and wished to proceed at this time.  DESCRIPTION OF PROCEDURE:  The patient was brought to OR room 5 and underwent a general anesthetic, after adequate level of anesthesia was obtained, the patient's was placed in the right lateral position with the left side up and the left lower extremity was prepped using DuraPrep and draped in a sterile field.  A posterior and plantar incision was made.  The Darrick Penna was used to reduce the fracture site.  Two  K-wires were then advanced across the fracture site perpendicular to the fracture site and a 7.3 partially threaded cannulated screws were used to stabilize the fracture.  C-arm fluoroscopy verified reduction in both AP and lateral planes.  The stab incisions were irrigated with normal saline.  The skin was closed using 2-0 nylon.  The wound was covered with Adaptic orthopedic sponges, ABD dressing, Kerlix and Coban.  The patient was extubated and taken to PACU in stable condition.  Plan for overnight observation.  Prescription for Vicodin for pain.  Kneeling walker from [facility] home [facility].  Follow up in the office in 1 week     Nadara Mustard, MD     MVD/MEDQ  D:  12/30/2010  T:  12/31/2010  Job:  [id number]  Electronically Signed by Aldean Baker MD on 01/11/2011 06:27:53 AM

## 2011-01-11 NOTE — Discharge Summary (Signed)
  NAME:  DESHIA, VANDERHOOF NO.:  [id number]  MEDICAL RECORD NO.:  [id number]  LOCATION:  5033                         FACILITY:  [facility]  PHYSICIAN:  Nadara Mustard, MD     DATE OF BIRTH:  [birth year]  DATE OF ADMISSION:  12/30/2010 DATE OF DISCHARGE:  01/03/2011                              DISCHARGE SUMMARY   FINAL DIAGNOSIS:  Charcot fracture, left calcaneus.  SURGICAL PROCEDURES:  Open reduction internal fixation of left calcaneus.  Discharged to skilled nursing in stable condition.  Medications include her admission medications which are unchanged plus additional medications which include Percocet 1 p.o. q.4 h p.r.n. for pain.  The patient will continue with her aspirin daily for DVT prophylaxis.  Physical therapy, progressive ambulation, kneeling walker for ambulation with nonweightbearing on the left lower extremity for 4 weeks.  The patient is aware of the fracture boot on the left lower extremity at alltimes.  WOUND CARE:  Mepilex dressing to the heel incisions on the left, p.r.n. wound care to the right foot, DuoDERM to the pressure areas on her right foot change as needed.  Follow up with Dr. Lajoyce Corners in 2 weeks.  Discharge to skilled nursing in stable condition     Nadara Mustard, MD     MVD/MEDQ  D:  01/03/2011  T:  01/03/2011  Job:  [id number]  Electronically Signed by Aldean Baker MD on 01/11/2011 06:27:48 AM

## 2011-01-18 ENCOUNTER — Encounter (INDEPENDENT_AMBULATORY_CARE_PROVIDER_SITE_OTHER): Payer: Medicare Other | Admitting: Ophthalmology

## 2011-01-18 DIAGNOSIS — H43819 Vitreous degeneration, unspecified eye: Secondary | ICD-10-CM

## 2011-01-18 DIAGNOSIS — H33009 Unspecified retinal detachment with retinal break, unspecified eye: Secondary | ICD-10-CM

## 2011-01-24 ENCOUNTER — Ambulatory Visit (HOSPITAL_COMMUNITY): Payer: Medicare Other

## 2011-01-24 ENCOUNTER — Ambulatory Visit (HOSPITAL_COMMUNITY)
Admission: RE | Admit: 2011-01-24 | Discharge: 2011-01-25 | Disposition: A | Discharge status: Home / Self Care | Payer: Medicare Other | Source: Ambulatory Visit | Attending: Ophthalmology | Admitting: Ophthalmology

## 2011-01-24 DIAGNOSIS — H33009 Unspecified retinal detachment with retinal break, unspecified eye: Secondary | ICD-10-CM | POA: Insufficient documentation

## 2011-01-24 DIAGNOSIS — E119 Type 2 diabetes mellitus without complications: Secondary | ICD-10-CM | POA: Insufficient documentation

## 2011-01-24 DIAGNOSIS — I10 Essential (primary) hypertension: Secondary | ICD-10-CM | POA: Insufficient documentation

## 2011-01-24 DIAGNOSIS — K219 Gastro-esophageal reflux disease without esophagitis: Secondary | ICD-10-CM | POA: Insufficient documentation

## 2011-01-24 LAB — BASIC METABOLIC PANEL
BUN: 29 mg/dL — ABNORMAL HIGH (ref 6–23)
CO2: 30 mEq/L (ref 19–32)
Calcium: 10.5 mg/dL (ref 8.4–10.5)
Chloride: 98 mEq/L (ref 96–112)
Creatinine, Ser: 1.26 mg/dL — ABNORMAL HIGH (ref 0.50–1.10)
GFR calc Af Amer: 51 mL/min — ABNORMAL LOW (ref >60)
GFR calc non Af Amer: 42 mL/min — ABNORMAL LOW (ref >60)
Glucose, Bld: 158 mg/dL — ABNORMAL HIGH (ref 70–99)
Potassium: 5.7 mEq/L — ABNORMAL HIGH (ref 3.5–5.1)
Sodium: 135 mEq/L (ref 135–145)

## 2011-01-24 LAB — GLUCOSE, CAPILLARY
Glucose-Capillary: 176 mg/dL — ABNORMAL HIGH (ref 70–99)
Glucose-Capillary: 236 mg/dL — ABNORMAL HIGH (ref 70–99)
Glucose-Capillary: 293 mg/dL — ABNORMAL HIGH (ref 70–99)

## 2011-01-24 LAB — CBC
HCT: 36.5 % (ref 36.0–46.0)
Hemoglobin: 11.7 g/dL — ABNORMAL LOW (ref 12.0–15.0)
MCH: 30.2 pg (ref 26.0–34.0)
MCHC: 32.1 g/dL (ref 30.0–36.0)
MCV: 94.3 fL (ref 78.0–100.0)
Platelets: 388 10*3/uL (ref 150–400)
RBC: 3.87 MIL/uL (ref 3.87–5.11)
RDW: 13.9 % (ref 11.5–15.5)
WBC: 11.3 10*3/uL — ABNORMAL HIGH (ref 4.0–10.5)

## 2011-01-25 LAB — GLUCOSE, CAPILLARY
Glucose-Capillary: 143 mg/dL — ABNORMAL HIGH (ref 70–99)
Glucose-Capillary: 146 mg/dL — ABNORMAL HIGH (ref 70–99)

## 2011-01-26 NOTE — Op Note (Signed)
  NAME:  Sabrina Baker, Sabrina Baker NO.:  [id number]  MEDICAL RECORD NO.:  [id number]  LOCATION:  5151                         FACILITY:  [facility]  PHYSICIAN:  Beulah Gandy. Ashley Royalty, M.D. DATE OF BIRTH:  Mar 18, 1941  DATE OF PROCEDURE:  01/24/2011 DATE OF DISCHARGE:                              OPERATIVE REPORT   ADMISSION DIAGNOSIS:  Rhegmatogenous retinal detachment, right eye.  PROCEDURE:  Scleral buckle, right eye; retinal photocoagulation, right eye; gas-fluid exchange, right eye.  SURGEON:  Beulah Gandy. Ashley Royalty, MD  ASSISTANT:  Rosalie Doctor, SA  ANESTHESIA:  General.  DETAILS:  Usual prep and drape, 360-degree limbal peritomy, isolation of 4 rectus muscles on 2-0 silk.  Scleral dissection for 360 degrees.  Mini flaps were required between from 8 o'clock to 12 o'clock.  The scleral was black in this area and thought to be very thin.  Diathermy was placed in the bed.  A 279 implant was placed around the globe with the joint at 1 o'clock.  A 240 band was placed around the globe with 270 sleeve at 1 o'clock.  Perforation site was chosen at 10 o'clock.  A large amount of clear, colorless subretinal fluid came forth.  C3F8 in 25% concentration was injected into the vitreous cavity to reinflate the globe.  An additional perforation site was chosen at 5 o'clock in the posterior aspect of the of the bed.  A moderate amount of clear, colorless subretinal fluid came through this perforation as well. Additional gas was injected at this point.  The buckle was adjusted and trimmed.  The band was adjusted and trimmed.  Indirect ophthalmoscopy showed the retina to be lying nicely in place on the scleral buckle. The indirect ophthalmoscope laser was moved into place, 1119 burns were placed around the retinal periphery.  The power was between 6 and 900 milliwatts, 1000 microns each, and 0.1 seconds each.  The conjunctiva was reposited with 7-0 chromic suture.  The scleral sutures were  drawn securely and a proper indentation of the globe was created.  The sutures were knotted and the free ends removed.  Then, the conjunctiva was reposited with 7-0 chromic suture.  Polymyxin and gentamicin were irrigated into [place] space.  Atropine solution was applied.  Decadron 10 mg was injected into the lower subconjunctival space.  Closing pressure was less than 10 with Barraquer tonometer.  Complications none. Duration 2 hours.  Polysporin, a patch, and shield were placed.  The patient was awakened and taken to recovery in satisfactory condition.     Beulah Gandy. Ashley Royalty, M.D.     JDM/MEDQ  D:  01/24/2011  T:  01/25/2011  Job:  [id number]  Electronically Signed by Alan Mulder M.D. on 01/26/2011 08:52:38 PM

## 2011-01-31 ENCOUNTER — Inpatient Hospital Stay (INDEPENDENT_AMBULATORY_CARE_PROVIDER_SITE_OTHER): Payer: Medicare Other | Admitting: Ophthalmology

## 2011-01-31 DIAGNOSIS — H33009 Unspecified retinal detachment with retinal break, unspecified eye: Secondary | ICD-10-CM

## 2011-02-22 ENCOUNTER — Encounter (INDEPENDENT_AMBULATORY_CARE_PROVIDER_SITE_OTHER): Payer: Medicare Other | Admitting: Ophthalmology

## 2011-02-22 DIAGNOSIS — H33009 Unspecified retinal detachment with retinal break, unspecified eye: Secondary | ICD-10-CM

## 2011-03-07 ENCOUNTER — Encounter (INDEPENDENT_AMBULATORY_CARE_PROVIDER_SITE_OTHER): Payer: Medicare Other | Admitting: Ophthalmology

## 2011-03-07 DIAGNOSIS — H33009 Unspecified retinal detachment with retinal break, unspecified eye: Secondary | ICD-10-CM

## 2011-03-14 ENCOUNTER — Ambulatory Visit (HOSPITAL_COMMUNITY)
Admission: RE | Admit: 2011-03-14 | Discharge: 2011-03-15 | Disposition: A | Discharge status: Home / Self Care | Payer: Medicare Other | Source: Ambulatory Visit | Attending: Ophthalmology | Admitting: Ophthalmology

## 2011-03-14 DIAGNOSIS — H33009 Unspecified retinal detachment with retinal break, unspecified eye: Secondary | ICD-10-CM

## 2011-03-14 DIAGNOSIS — H3520 Other non-diabetic proliferative retinopathy, unspecified eye: Secondary | ICD-10-CM

## 2011-03-14 LAB — BASIC METABOLIC PANEL
BUN: 34 mg/dL — ABNORMAL HIGH (ref 6–23)
Chloride: 100 mEq/L (ref 96–112)
GFR calc non Af Amer: 34 mL/min — ABNORMAL LOW (ref >90)
Glucose, Bld: 292 mg/dL — ABNORMAL HIGH (ref 70–99)
Potassium: 5.2 mEq/L — ABNORMAL HIGH (ref 3.5–5.1)

## 2011-03-14 LAB — CBC
MCH: 30.4 pg (ref 26.0–34.0)
MCV: 94.6 fL (ref 78.0–100.0)
Platelets: 385 10*3/uL (ref 150–400)
RBC: 3.91 MIL/uL (ref 3.87–5.11)
RDW: 14.1 % (ref 11.5–15.5)
WBC: 13.4 10*3/uL — ABNORMAL HIGH (ref 4.0–10.5)

## 2011-03-14 LAB — GLUCOSE, CAPILLARY
Glucose-Capillary: 235 mg/dL — ABNORMAL HIGH (ref 70–99)
Glucose-Capillary: 242 mg/dL — ABNORMAL HIGH (ref 70–99)
Glucose-Capillary: 439 mg/dL — ABNORMAL HIGH (ref 70–99)
Glucose-Capillary: 461 mg/dL — ABNORMAL HIGH (ref 70–99)

## 2011-03-14 LAB — GLUCOSE, RANDOM: Glucose, Bld: 513 mg/dL — ABNORMAL HIGH (ref 70–99)

## 2011-03-15 LAB — GLUCOSE, CAPILLARY: Glucose-Capillary: 103 mg/dL — ABNORMAL HIGH (ref 70–99)

## 2011-03-21 ENCOUNTER — Inpatient Hospital Stay (INDEPENDENT_AMBULATORY_CARE_PROVIDER_SITE_OTHER): Payer: Medicare Other | Admitting: Ophthalmology

## 2011-03-21 DIAGNOSIS — H33009 Unspecified retinal detachment with retinal break, unspecified eye: Secondary | ICD-10-CM

## 2011-03-21 NOTE — Op Note (Signed)
NAMEMarland Kitchen  SHAKYRA, MATTERA NO.:  [id number]  MEDICAL RECORD NO.:  [id number]  LOCATION:  5127                         FACILITY:  [facility]  PHYSICIAN:  Beulah Gandy. Ashley Royalty, M.D. 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  PROCEDURES:  Repair of complex retinal detachment with pars plana vitrectomy, retinal photocoagulation, Perfluoron injection, Perfluoron removal, gas fluid exchange, iridectomy, membrane peel, silicone oral injection, all in the right eye.  SURGEON:  Beulah Gandy. Ashley Royalty, MD  ASSISTANT:  Rosalie Doctor, SA  ANESTHESIA:  General.  DETAILS:  Usual prep and drape, 25 gauge trocars placed at 8 and 10 o'clock with MVR incision at 2 o'clock.  Provisc placed on the corneal surface and the Biome viewing system moved into place.  Pars plana vitrectomy was begun just behind the crystalline lens where fibrous membranes were encountered, these were carefully removed under low suction and rapid cutting.  The vitrectomy was performed in a core fashion down to the macular region where macular detachment was seen. The retina was thrown into folds and the vitrectomy was carried out in the mid periphery where vitreous and scar tissue were stripped from the retinal surface.  The vitrectomy was then moved out into the far periphery with the wide field Biome viewing lens and vitrectomy was carried out to the vitreous base with all vitreous stripped from the surface of the retina.  Once this was accomplished, the Perfluoron 5 mL was injected into the vitreous cavity in a slow controlled manner to reattach the retina.  The endolaser was positioned in the eye, 1103 burns placed around the retinal periphery.  The power was 2000 milliwatts, 1000 microns each, and 0.1  seconds each.  The Perfluoron was removed and intravitreal gas was used in place of the Perfluoron liquid. A full reattachment of the retina remained.  The Perfluoron was rinsed with BSS and additional fluid was removed with a [place] ophthalmics brush.  The silicone oil was then injected into the vitreous cavity in a slow controlled manner.  A peripheral iridectomy was created at 6 o'clock in the peripheral aspect of the iris.  The vitreous was filled with silicone oil and the silicone oil device was removed, some areas of gas were relieved to obtain a closing pressure of 10 with a Baer keratometer of the trocars, 25 gauge trocars were removed.  The 2 o'clock wound was closed with interrupted 9-0 nylon suture.  The conjunctiva was cauterized with wet-field cautery.  Polymyxin and gentamicin were irrigated into tenon space.  Atropine solution was applied, Decadron 10 mg was injected to the lower subconjunctival space.  Marcaine was injected around the globe for postop pain.  Closing pressure was 10 with a Risk manager. Complications none.  Duration one hour.  The patient was awakened and taken to recovery in satisfactory condition.     Beulah Gandy. Ashley Royalty, M.D.     JDM/MEDQ  D:  03/14/2011  T:  03/15/2011  Job:  [id number]  Electronically Signed by  Alan Mulder M.D. on 03/21/2011 05:31:39 PM

## 2011-04-11 ENCOUNTER — Encounter (INDEPENDENT_AMBULATORY_CARE_PROVIDER_SITE_OTHER): Payer: Medicare Other | Admitting: Ophthalmology

## 2011-04-11 DIAGNOSIS — H33009 Unspecified retinal detachment with retinal break, unspecified eye: Secondary | ICD-10-CM

## 2011-05-03 ENCOUNTER — Encounter (INDEPENDENT_AMBULATORY_CARE_PROVIDER_SITE_OTHER): Payer: Medicare Other | Admitting: Ophthalmology

## 2011-05-15 ENCOUNTER — Encounter (INDEPENDENT_AMBULATORY_CARE_PROVIDER_SITE_OTHER): Payer: Medicare Other | Admitting: Ophthalmology

## 2011-05-15 DIAGNOSIS — H33009 Unspecified retinal detachment with retinal break, unspecified eye: Secondary | ICD-10-CM

## 2011-06-26 ENCOUNTER — Encounter (INDEPENDENT_AMBULATORY_CARE_PROVIDER_SITE_OTHER): Payer: Medicare Other | Admitting: Ophthalmology

## 2011-06-26 DIAGNOSIS — H35379 Puckering of macula, unspecified eye: Secondary | ICD-10-CM

## 2011-06-26 DIAGNOSIS — H43819 Vitreous degeneration, unspecified eye: Secondary | ICD-10-CM

## 2011-06-26 DIAGNOSIS — H33009 Unspecified retinal detachment with retinal break, unspecified eye: Secondary | ICD-10-CM

## 2011-08-15 ENCOUNTER — Other Ambulatory Visit: Payer: Self-pay | Admitting: Family Medicine

## 2011-08-15 DIAGNOSIS — Z1231 Encounter for screening mammogram for malignant neoplasm of breast: Secondary | ICD-10-CM

## 2011-09-12 ENCOUNTER — Ambulatory Visit
Admission: RE | Admit: 2011-09-12 | Discharge: 2011-09-12 | Disposition: A | Discharge status: Home / Self Care | Payer: Medicare Other | Source: Ambulatory Visit | Attending: Family Medicine | Admitting: Family Medicine

## 2011-09-12 DIAGNOSIS — Z1231 Encounter for screening mammogram for malignant neoplasm of breast: Secondary | ICD-10-CM

## 2011-09-18 ENCOUNTER — Ambulatory Visit: Payer: Medicare Other

## 2011-09-25 ENCOUNTER — Encounter (INDEPENDENT_AMBULATORY_CARE_PROVIDER_SITE_OTHER): Payer: Medicare Other | Admitting: Ophthalmology

## 2011-09-25 DIAGNOSIS — H43819 Vitreous degeneration, unspecified eye: Secondary | ICD-10-CM

## 2011-09-25 DIAGNOSIS — H33009 Unspecified retinal detachment with retinal break, unspecified eye: Secondary | ICD-10-CM

## 2011-09-25 DIAGNOSIS — H26499 Other secondary cataract, unspecified eye: Secondary | ICD-10-CM

## 2011-10-09 ENCOUNTER — Ambulatory Visit (INDEPENDENT_AMBULATORY_CARE_PROVIDER_SITE_OTHER): Payer: Medicare Other | Admitting: Ophthalmology

## 2011-10-09 DIAGNOSIS — H2700 Aphakia, unspecified eye: Secondary | ICD-10-CM

## 2011-10-31 ENCOUNTER — Other Ambulatory Visit: Payer: Self-pay | Admitting: Family Medicine

## 2011-10-31 DIAGNOSIS — N289 Disorder of kidney and ureter, unspecified: Secondary | ICD-10-CM

## 2011-10-31 DIAGNOSIS — Z780 Asymptomatic menopausal state: Secondary | ICD-10-CM

## 2011-11-03 ENCOUNTER — Ambulatory Visit
Admission: RE | Admit: 2011-11-03 | Discharge: 2011-11-03 | Disposition: A | Discharge status: Home / Self Care | Payer: Medicare Other | Source: Ambulatory Visit | Attending: Family Medicine | Admitting: Family Medicine

## 2011-11-03 DIAGNOSIS — N289 Disorder of kidney and ureter, unspecified: Secondary | ICD-10-CM

## 2011-11-03 DIAGNOSIS — Z780 Asymptomatic menopausal state: Secondary | ICD-10-CM

## 2011-11-23 ENCOUNTER — Other Ambulatory Visit: Payer: Medicare Other

## 2012-01-08 ENCOUNTER — Encounter (INDEPENDENT_AMBULATORY_CARE_PROVIDER_SITE_OTHER): Payer: Medicare Other | Admitting: Ophthalmology

## 2012-01-08 DIAGNOSIS — H33009 Unspecified retinal detachment with retinal break, unspecified eye: Secondary | ICD-10-CM

## 2012-01-08 DIAGNOSIS — H43819 Vitreous degeneration, unspecified eye: Secondary | ICD-10-CM

## 2012-04-17 ENCOUNTER — Encounter (HOSPITAL_COMMUNITY): Payer: Self-pay | Admitting: *Deleted

## 2012-04-17 ENCOUNTER — Emergency Department (HOSPITAL_COMMUNITY): Payer: Medicare Other

## 2012-04-17 ENCOUNTER — Inpatient Hospital Stay (HOSPITAL_COMMUNITY): Payer: Medicare Other

## 2012-04-17 ENCOUNTER — Inpatient Hospital Stay (HOSPITAL_COMMUNITY)
Admission: EM | Admit: 2012-04-17 | Discharge: 2012-04-21 | DRG: 178 | Disposition: A | Discharge status: Home / Self Care | Payer: Medicare Other | Attending: Internal Medicine | Admitting: Internal Medicine

## 2012-04-17 DIAGNOSIS — E1142 Type 2 diabetes mellitus with diabetic polyneuropathy: Secondary | ICD-10-CM | POA: Diagnosis present

## 2012-04-17 DIAGNOSIS — E860 Dehydration: Secondary | ICD-10-CM | POA: Diagnosis present

## 2012-04-17 DIAGNOSIS — E785 Hyperlipidemia, unspecified: Secondary | ICD-10-CM | POA: Diagnosis present

## 2012-04-17 DIAGNOSIS — Z79899 Other long term (current) drug therapy: Secondary | ICD-10-CM

## 2012-04-17 DIAGNOSIS — E1149 Type 2 diabetes mellitus with other diabetic neurological complication: Secondary | ICD-10-CM | POA: Diagnosis present

## 2012-04-17 DIAGNOSIS — J189 Pneumonia, unspecified organism: Secondary | ICD-10-CM

## 2012-04-17 DIAGNOSIS — Z823 Family history of stroke: Secondary | ICD-10-CM

## 2012-04-17 DIAGNOSIS — E875 Hyperkalemia: Secondary | ICD-10-CM | POA: Diagnosis present

## 2012-04-17 DIAGNOSIS — E119 Type 2 diabetes mellitus without complications: Secondary | ICD-10-CM | POA: Diagnosis present

## 2012-04-17 DIAGNOSIS — G458 Other transient cerebral ischemic attacks and related syndromes: Secondary | ICD-10-CM | POA: Diagnosis present

## 2012-04-17 DIAGNOSIS — K529 Noninfective gastroenteritis and colitis, unspecified: Secondary | ICD-10-CM | POA: Diagnosis present

## 2012-04-17 DIAGNOSIS — J690 Pneumonitis due to inhalation of food and vomit: Principal | ICD-10-CM | POA: Diagnosis present

## 2012-04-17 DIAGNOSIS — R739 Hyperglycemia, unspecified: Secondary | ICD-10-CM

## 2012-04-17 DIAGNOSIS — I10 Essential (primary) hypertension: Secondary | ICD-10-CM | POA: Diagnosis present

## 2012-04-17 DIAGNOSIS — G459 Transient cerebral ischemic attack, unspecified: Secondary | ICD-10-CM

## 2012-04-17 DIAGNOSIS — Z8249 Family history of ischemic heart disease and other diseases of the circulatory system: Secondary | ICD-10-CM

## 2012-04-17 DIAGNOSIS — Z833 Family history of diabetes mellitus: Secondary | ICD-10-CM

## 2012-04-17 DIAGNOSIS — Z7982 Long term (current) use of aspirin: Secondary | ICD-10-CM

## 2012-04-17 DIAGNOSIS — R112 Nausea with vomiting, unspecified: Secondary | ICD-10-CM

## 2012-04-17 DIAGNOSIS — E7800 Pure hypercholesterolemia, unspecified: Secondary | ICD-10-CM | POA: Diagnosis present

## 2012-04-17 DIAGNOSIS — K5289 Other specified noninfective gastroenteritis and colitis: Secondary | ICD-10-CM

## 2012-04-17 HISTORY — DX: Essential (primary) hypertension: I10

## 2012-04-17 HISTORY — DX: Type 2 diabetes mellitus without complications: E11.9

## 2012-04-17 HISTORY — DX: Pure hypercholesterolemia, unspecified: E78.00

## 2012-04-17 HISTORY — DX: Myoneural disorder, unspecified: G70.9

## 2012-04-17 LAB — COMPREHENSIVE METABOLIC PANEL
AST: 15 U/L (ref 0–37)
Alkaline Phosphatase: 84 U/L (ref 39–117)
BUN: 22 mg/dL (ref 6–23)
CO2: 24 mEq/L (ref 19–32)
Chloride: 99 mEq/L (ref 96–112)
Creatinine, Ser: 1.02 mg/dL (ref 0.50–1.10)
GFR calc non Af Amer: 54 mL/min — ABNORMAL LOW (ref >90)
Potassium: 4.8 mEq/L (ref 3.5–5.1)
Total Bilirubin: 0.3 mg/dL (ref 0.3–1.2)

## 2012-04-17 LAB — CBC WITH DIFFERENTIAL/PLATELET
HCT: 36.2 % (ref 36.0–46.0)
Hemoglobin: 11.3 g/dL — ABNORMAL LOW (ref 12.0–15.0)
Lymphocytes Relative: 3 % — ABNORMAL LOW (ref 12–46)
Monocytes Absolute: 0.5 10*3/uL (ref 0.1–1.0)
Monocytes Relative: 2 % — ABNORMAL LOW (ref 3–12)
Neutro Abs: 20.6 10*3/uL — ABNORMAL HIGH (ref 1.7–7.7)
RBC: 3.69 MIL/uL — ABNORMAL LOW (ref 3.87–5.11)
WBC: 21.8 10*3/uL — ABNORMAL HIGH (ref 4.0–10.5)

## 2012-04-17 LAB — LIPASE, BLOOD: Lipase: 30 U/L (ref 11–59)

## 2012-04-17 LAB — CBC
Hemoglobin: 10.4 g/dL — ABNORMAL LOW (ref 12.0–15.0)
MCH: 30.2 pg (ref 26.0–34.0)
MCHC: 31.9 g/dL (ref 30.0–36.0)
MCV: 94.8 fL (ref 78.0–100.0)
Platelets: 317 10*3/uL (ref 150–400)
RBC: 3.44 MIL/uL — ABNORMAL LOW (ref 3.87–5.11)

## 2012-04-17 LAB — CREATININE, SERUM: Creatinine, Ser: 1.11 mg/dL — ABNORMAL HIGH (ref 0.50–1.10)

## 2012-04-17 LAB — URINALYSIS, ROUTINE W REFLEX MICROSCOPIC
Hgb urine dipstick: NEGATIVE
Ketones, ur: NEGATIVE mg/dL
Protein, ur: NEGATIVE mg/dL
Urobilinogen, UA: 0.2 mg/dL (ref 0.0–1.0)

## 2012-04-17 LAB — GLUCOSE, CAPILLARY
Glucose-Capillary: 258 mg/dL — ABNORMAL HIGH (ref 70–99)
Glucose-Capillary: 152 mg/dL — ABNORMAL HIGH (ref 70–99)
Glucose-Capillary: 141 mg/dL — ABNORMAL HIGH (ref 70–99)

## 2012-04-17 LAB — URINE MICROSCOPIC-ADD ON

## 2012-04-17 MED ORDER — SODIUM CHLORIDE 0.9 % IV BOLUS (SEPSIS)
500.0000 mL | Freq: Once | INTRAVENOUS | Status: AC
  Administered 2012-04-17: 500 mL via INTRAVENOUS

## 2012-04-17 MED ORDER — IPRATROPIUM BROMIDE 0.02 % IN SOLN: 0.5000 mg | RESPIRATORY_TRACT | Status: DC | PRN

## 2012-04-17 MED ORDER — ALBUTEROL SULFATE (5 MG/ML) 0.5% IN NEBU: 2.5000 mg | INHALATION_SOLUTION | RESPIRATORY_TRACT | Status: DC | PRN

## 2012-04-17 MED ORDER — ONDANSETRON HCL 4 MG/2ML IJ SOLN
4.0000 mg | Freq: Once | INTRAMUSCULAR | Status: AC
  Administered 2012-04-17: 4 mg via INTRAVENOUS
  Filled 2012-04-17: qty 2

## 2012-04-17 MED ORDER — INSULIN ASPART 100 UNIT/ML ~~LOC~~ SOLN
0.0000 [IU] | SUBCUTANEOUS | Status: DC
  Administered 2012-04-17: 1 [IU] via SUBCUTANEOUS
  Administered 2012-04-17 – 2012-04-18 (×2): 2 [IU] via SUBCUTANEOUS
  Administered 2012-04-18: 1 [IU] via SUBCUTANEOUS

## 2012-04-17 MED ORDER — ACETAMINOPHEN 325 MG PO TABS
650.0000 mg | ORAL_TABLET | Freq: Four times a day (QID) | ORAL | Status: DC | PRN
  Administered 2012-04-17 – 2012-04-19 (×3): 650 mg via ORAL
  Filled 2012-04-17 (×3): qty 2

## 2012-04-17 MED ORDER — SODIUM CHLORIDE 0.9 % IV SOLN
INTRAVENOUS | Status: DC
  Administered 2012-04-17 – 2012-04-18 (×2): via INTRAVENOUS

## 2012-04-17 MED ORDER — PIPERACILLIN-TAZOBACTAM 3.375 G IVPB
3.3750 g | Freq: Three times a day (TID) | INTRAVENOUS | Status: DC
  Administered 2012-04-17 – 2012-04-18 (×2): 3.375 g via INTRAVENOUS
  Filled 2012-04-17 (×4): qty 50

## 2012-04-17 MED ORDER — AZITHROMYCIN 500 MG IV SOLR
500.0000 mg | Freq: Once | INTRAVENOUS | Status: AC
  Administered 2012-04-17: 500 mg via INTRAVENOUS
  Filled 2012-04-17: qty 500

## 2012-04-17 MED ORDER — HEPARIN SODIUM (PORCINE) 5000 UNIT/ML IJ SOLN
5000.0000 [IU] | Freq: Three times a day (TID) | INTRAMUSCULAR | Status: DC
  Filled 2012-04-17 (×2): qty 1

## 2012-04-17 MED ORDER — FAMOTIDINE IN NACL 20-0.9 MG/50ML-% IV SOLN
20.0000 mg | Freq: Two times a day (BID) | INTRAVENOUS | Status: DC
  Administered 2012-04-17 – 2012-04-19 (×4): 20 mg via INTRAVENOUS
  Filled 2012-04-17 (×5): qty 50

## 2012-04-17 MED ORDER — SODIUM CHLORIDE 0.9 % IV BOLUS (SEPSIS): 500.0000 mL | Freq: Once | INTRAVENOUS | Status: DC

## 2012-04-17 MED ORDER — DEXTROSE 5 % IV SOLN
1.0000 g | Freq: Once | INTRAVENOUS | Status: AC
  Administered 2012-04-17: 1 g via INTRAVENOUS
  Filled 2012-04-17: qty 10

## 2012-04-17 NOTE — ED Provider Notes (Signed)
History     CSN: [id number]  Arrival date & time 04/17/12  1478   First MD Initiated Contact with Patient 04/17/12 [phone]      Chief Complaint  Patient presents with  . Abdominal Pain    rlq pain  . Emesis  . Diarrhea    (Consider location/radiation/quality/duration/timing/severity/associated sxs/prior treatment) Patient is a 71 y.o. female presenting with abdominal pain, vomiting, and diarrhea. The history is provided by the patient.  Abdominal Pain The primary symptoms of the illness include abdominal pain, fatigue, nausea, vomiting and diarrhea. The primary symptoms of the illness do not include shortness of breath or dysuria.  Additional symptoms associated with the illness include back pain. Symptoms associated with the illness do not include chills, diaphoresis or constipation.  Emesis  Associated symptoms include abdominal pain, diarrhea and headaches. Pertinent negatives include no chills.  Diarrhea The primary symptoms include fatigue, abdominal pain, nausea, vomiting and diarrhea. Primary symptoms do not include dysuria.  The illness is also significant for back pain. The illness does not include chills or constipation.   patient presents with nausea vomiting diarrhea. It began 1 in the morning but increased at around 2 AM. She also has some lower abdominal pain it goes to her back. No fevers. No cough. She states that she was unable to roll over last night. She states she also had some chest pain in her right chest which were arm. She states this is not unusual for her. She also had neck pain. She also had a headache. No clear sick contacts. The pain is dull.  Past Medical History  Diagnosis Date  . Diabetes mellitus without complication   . Hypercholesteremia   . Hypertension     Past Surgical History  Procedure Date  . Eye surgery     detached retna  . Ankle surgery     No family history on file.  History  Substance Use Topics  . Smoking status: Never Smoker    . Smokeless tobacco: Not on file  . Alcohol Use: No    OB History    Grav Para Term Preterm Abortions TAB SAB Ect Mult Living                  Review of Systems  Constitutional: Positive for appetite change and fatigue. Negative for chills and diaphoresis.  HENT: Negative for tinnitus.   Respiratory: Negative for choking and shortness of breath.   Cardiovascular: Positive for chest pain.  Gastrointestinal: Positive for nausea, vomiting, abdominal pain and diarrhea. Negative for constipation.  Genitourinary: Negative for dysuria and dyspareunia.  Musculoskeletal: Positive for back pain.  Neurological: Positive for headaches.    Allergies  Review of patient's allergies indicates no known allergies.  Home Medications   Current Outpatient Rx  Name  Route  Sig  Dispense  Refill  . AMLODIPINE-ATORVASTATIN 10-20 MG PO TABS   Oral   Take 1 tablet by mouth at bedtime.         . DULOXETINE HCL 30 MG PO CPEP   Oral   Take 30 mg by mouth 2 (two) times daily.         Marland Kitchen EXENATIDE 10 MCG/0.04ML [place] SOLN   Subcutaneous   Inject 10 mcg into the skin 2 (two) times daily with a meal.         . IRON 325 (65 FE) MG PO TABS   Oral   Take 1 tablet by mouth 3 (three) times daily.         Marland Kitchen  FUROSEMIDE 80 MG PO TABS   Oral   Take 80 mg by mouth every morning.         Marland Kitchen HYDROCODONE-ACETAMINOPHEN PO   Oral   Take 1 tablet by mouth every 6 (six) hours as needed. For pain         . METFORMIN HCL 1000 MG PO TABS   Oral   Take 1,000 mg by mouth 2 (two) times daily with a meal.         . METHOCARBAMOL 500 MG PO TABS   Oral   Take 500 mg by mouth once as needed. For muscle relaxant         . PIOGLITAZONE HCL 30 MG PO TABS   Oral   Take 30 mg by mouth every evening.         Marland Kitchen VALSARTAN 160 MG PO TABS   Oral   Take 160 mg by mouth daily.           BP 130/64  Pulse 97  Temp 100.5 F (38.1 C) (Oral)  Resp 24  SpO2 89%  Physical Exam  Nursing note and  vitals reviewed. Constitutional: She is oriented to person, place, and time. She appears well-developed and well-nourished.  HENT:  Head: Normocephalic and atraumatic.  Eyes: EOM are normal. Pupils are equal, round, and reactive to light.  Neck: Normal range of motion. Neck supple.  Cardiovascular: Normal rate, regular rhythm and normal heart sounds.   No murmur heard. Pulmonary/Chest: Effort normal and breath sounds normal. No respiratory distress. She has no wheezes. She has no rales.  Abdominal: Soft. Bowel sounds are normal. She exhibits no distension. There is no tenderness. There is no rebound and no guarding.  Musculoskeletal: Normal range of motion.  Neurological: She is alert and oriented to person, place, and time. No cranial nerve deficit.  Skin: Skin is warm and dry.  Psychiatric: She has a normal mood and affect. Her speech is normal.    ED Course  Procedures (including critical care time)  Labs Reviewed  CBC WITH DIFFERENTIAL - Abnormal; Notable for the following:    WBC 21.8 (*)     RBC 3.69 (*)     Hemoglobin 11.3 (*)     Neutrophils Relative 94 (*)     Neutro Abs 20.6 (*)     Lymphocytes Relative 3 (*)     Lymphs Abs 0.6 (*)     Monocytes Relative 2 (*)     All other components within normal limits  COMPREHENSIVE METABOLIC PANEL - Abnormal; Notable for the following:    Glucose, Bld 399 (*)     Albumin 3.2 (*)     GFR calc non Af Amer 54 (*)     GFR calc Af Amer 63 (*)     All other components within normal limits  URINALYSIS, ROUTINE W REFLEX MICROSCOPIC - Abnormal; Notable for the following:    Glucose, UA >1000 (*)     All other components within normal limits  GLUCOSE, CAPILLARY - Abnormal; Notable for the following:    Glucose-Capillary 258 (*)     All other components within normal limits  URINE MICROSCOPIC-ADD ON   Dg Abd Acute W/chest  04/17/2012  *RADIOLOGY REPORT*  Clinical Data: Low back pain.  Abdominal pain.  Vomiting and diarrhea.  ACUTE  ABDOMEN SERIES (ABDOMEN 2 VIEW & CHEST 1 VIEW)  Comparison: 01/24/2011.  Findings: Left perihilar airspace disease is present, most compatible with pneumonia.  No effusion.  Cardiopericardial  silhouette appears within normal limits.  Partially visualized right proximal humeral fixation hardware.  No intra-abdominal free air.  Scattered loops of mildly dilated small bowel present centrally, with thickening of the valvulae.  Findings could be associated with enteritis or partial small bowel obstruction.  Gas is present in the proximal sigmoid.  IMPRESSION: 1.  Left perihilar airspace disease most compatible with pneumonia. Follow-up to ensure clearing recommended. 2.  A few loops of mildly dilated small bowel with air-fluid levels and mural thickening can be associated with small bowel obstruction or enteritis.   Original Report Authenticated By: Andreas Newport, M.D.      1. CAP (community acquired pneumonia)   2. Nausea vomiting and diarrhea     Date: 04/17/2012  Rate: 96  Rhythm: normal sinus rhythm  QRS Axis: normal  Intervals: normal  ST/T Wave abnormalities: normal  Conduction Disutrbances:none  Narrative Interpretation:   Old EKG Reviewed: unchanged     MDM  Patient with nausea vomiting and diarrhea. Also low-grade for cough. White count is elevated. She has an apparent pneumonia on x-ray. Her abdomen is nontender. Her sugar is elevated, however she does not appear to be in DKA. His comorbidities and age patient be admitted to the hospital.        Juliet Rude. Rubin Payor, MD 04/17/12 1022

## 2012-04-17 NOTE — ED Notes (Signed)
Pt to ED c/o RLQ pain, diarrhea and emesis (about every hour) since 1 am last night.

## 2012-04-17 NOTE — Progress Notes (Signed)
ANTIBIOTIC CONSULT NOTE - INITIAL  Pharmacy Consult for Zosyn Indication: Empiric aspiration PNA coverage  No Known Allergies  Patient Measurements:    Vital Signs: Temp: 100.5 F (38.1 C) (11/06 0839) Temp src: Oral (11/06 0839) BP: 130/64 mmHg (11/06 0943) Pulse Rate: 97  (11/06 0943) Intake/Output from previous day:   Intake/Output from this shift:    Labs:  [facility] 04/17/12 0652  WBC 21.8*  HGB 11.3*  PLT 338  LABCREA --  CREATININE 1.02   CrCl is unknown because there is no height on file for the current visit. No results found for this basename: VANCOTROUGH:2,VANCOPEAK:2,VANCORANDOM:2,GENTTROUGH:2,GENTPEAK:2,GENTRANDOM:2,TOBRATROUGH:2,TOBRAPEAK:2,TOBRARND:2,AMIKACINPEAK:2,AMIKACINTROU:2,AMIKACIN:2, in the last 72 hours   Microbiology: No results found for this or any previous visit (from the past 720 hour(s)).  Medical History: Past Medical History  Diagnosis Date  . Diabetes mellitus without complication   . Hypercholesteremia   . Hypertension     Assessment: 71 y.o. F who presented to the [facility] on 11/6 with vomiting, diarrhea, cough, and fever. CXR revealed left perihilar airspace disease -- possible PNA. Pharmacy was consulted to start Zosyn for empiric aspiration PNA. SCr 1.02, estimated CrCl~60 ml/min.   Goal of Therapy:  Proper antibiotics for infection/cultures adjusted for renal/hepatic function   Plan:  1. Zosyn 3.375g IV every 8 hours 2. Will continue to follow renal function, culture results, LOT, and antibiotic de-escalation plans   Georgina Pillion, PharmD, BCPS Clinical Pharmacist Pager: [phone] 04/17/2012 12:27 PM

## 2012-04-17 NOTE — ED Notes (Signed)
IV Zithromax infusing.  No distress noted with pt.  Pt is able to speak in complete sentences.  Resp symmetrical and unlabored.  Skin warm and dry.  Pt was noted to have intermittent episodes of SpO2 desaturation to 89% on RA.  Pt showed no distress and there was a good pleth wave noted.  O2 Applied at 2 L/min

## 2012-04-17 NOTE — ED Notes (Signed)
Pt is being transported to Vascular from MRI.  Sarah RN on 3W notified.

## 2012-04-17 NOTE — Progress Notes (Signed)
Utilization review completed.  

## 2012-04-17 NOTE — Progress Notes (Signed)
Bilateral:  No evidence of hemodynamically significant internal carotid artery stenosis.   Vertebral artery flow is antegrade.     

## 2012-04-17 NOTE — ED Notes (Signed)
Patient transported to X-ray 

## 2012-04-17 NOTE — ED Notes (Signed)
Pt is off the floor at the present.  Report called to Sarah RN on 3w

## 2012-04-17 NOTE — H&P (Signed)
PCP:   Cala Bradford, MD   Chief Complaint:  Vomiting, diarrhea, cough since yeasterday. Fever today.   HPI: This is a 71 year old female, with known history of HTN, diabetes mellitus with neuropathy, dyslipidemia, previous eye surgery for detached right retina 01/2011 and 03/1011, surgery for left calcaneal fracture 12/2010. Fractured right shoulder with  hardware in situ, left wrist and both feet, all in 2006. Presenting with above symptoms. Patient is a good historian, and according to her, she was quite well until 04/16/12, in the evening, she ate some cooked cabbage, corn bread, and onion rings, then later, some oatmeal. She went to bed at about midnight, and developed a headache, with discomfort down the right side of her neck and followed by weakness of her right leg, which she found very difficult to move.She denies dizziness. At about 1:00 AM on 04/17/12, she felt like going to the bathroom, but could'nt make it, and soiled her bed. Since then, she has had diarrhea, almost every hour. At some point, she started belching, coughed a a lot, then started vomiting all in all, she has vomited over 6 times. She has had persistent lower abdominal pain radiating to her back, and chills, but no fever. She called her daughter, who came over, and brought her to the ED at about 5:29m AM on 04/17/12. Right leg weakness has since resolved, and patient has no history of previous similar episodes. She denies chest pain.   Allergies:  No Known Allergies    Past Medical History  Diagnosis Date  . Diabetes mellitus without complication   . Hypercholesteremia   . Hypertension     Past Surgical History  Procedure Date  . Eye surgery     detached retna  . Ankle surgery     Prior to Admission medications   Medication Sig Start Date End Date Taking? Authorizing Provider  amlodipine-atorvastatin (CADUET) 10-20 MG per tablet Take 1 tablet by mouth at bedtime.   Yes Historical Provider, MD  DULoxetine  (CYMBALTA) 30 MG capsule Take 30 mg by mouth 2 (two) times daily.   Yes Historical Provider, MD  exenatide (BYETTA) 10 MCG/0.04ML SOLN Inject 10 mcg into the skin 2 (two) times daily with a meal.   Yes Historical Provider, MD  Ferrous Sulfate (IRON) 325 (65 FE) MG TABS Take 1 tablet by mouth 3 (three) times daily.   Yes Historical Provider, MD  furosemide (LASIX) 80 MG tablet Take 80 mg by mouth every morning.   Yes Historical Provider, MD  HYDROCODONE-ACETAMINOPHEN PO Take 1 tablet by mouth every 6 (six) hours as needed. For pain   Yes Historical Provider, MD  metFORMIN (GLUCOPHAGE) 1000 MG tablet Take 1,000 mg by mouth 2 (two) times daily with a meal.   Yes Historical Provider, MD  methocarbamol (ROBAXIN) 500 MG tablet Take 500 mg by mouth once as needed. For muscle relaxant   Yes Historical Provider, MD  pioglitazone (ACTOS) 30 MG tablet Take 30 mg by mouth every evening.   Yes Historical Provider, MD  valsartan (DIOVAN) 160 MG tablet Take 160 mg by mouth daily.   Yes Historical Provider, MD    Social History: Patient reports that she has never smoked. She does not have any smokeless tobacco history on file. She reports that she does not drink alcohol or use illicit drugs.  Family History:  Mother died at age 80 years from CHF. She was hypertensive and diabetic. Father died at age 29 years. He had a history of  recurrent TIAs.   Review of Systems:  As per HPI and chief complaint. Patent denies fatigue, diminished appetite, weight loss, blurred vision, difficulty in speaking, dysphagia, chest pain, cough, shortness of breath, orthopnea, paroxysmal nocturnal dyspnea, nausea, diaphoresis, hematemesis, melena, dysuria, nocturia, urinary frequency, hematochezia, lower extremity swelling, pain, or redness. The rest of the systems review is negative.  Physical Exam:  General:  Patient does not appear to be in obvious acute distress. Alert, communicative, fully oriented, talking in complete  sentences, not short of breath at rest.  HEENT:  Mild clinical pallor, no jaundice, no conjunctival injection or discharge. Buccal mucosa appears "dry".  NECK:  Supple, JVP not seen, no carotid bruits, no palpable lymphadenopathy, no palpable goiter. CHEST:  Clinically clear to auscultation, no wheezes, no crackles. HEART:  Sounds 1 and 2 heard, normal, regular, mildly tachycardic, no murmurs. ABDOMEN:  Full, soft, non-tender, generalized discomfort, no palpable organomegaly, no palpable masses, briskbowel sounds. GENITALIA:  Not examined. LOWER EXTREMITIES:  No pitting edema, palpable peripheral pulses. MUSCULOSKELETAL SYSTEM:  Generalized osteoarthritic changes, otherwise, normal. CENTRAL NERVOUS SYSTEM:  No focal neurologic deficit on gross examination.  Labs on Admission:  Results for orders placed during the hospital encounter of 04/17/12 (from the past 48 hour(s))  CBC WITH DIFFERENTIAL     Status: Abnormal   Collection Time   04/17/12  6:52 AM      Component Value Range Comment   WBC 21.8 (*) 4.0 - 10.5 K/uL    RBC 3.69 (*) 3.87 - 5.11 MIL/uL    Hemoglobin 11.3 (*) 12.0 - 15.0 g/dL    HCT 78.2  95.6 - 21.3 %    MCV 98.1  78.0 - 100.0 fL    MCH 30.6  26.0 - 34.0 pg    MCHC 31.2  30.0 - 36.0 g/dL    RDW 08.6  57.8 - 46.9 %    Platelets 338  150 - 400 K/uL    Neutrophils Relative 94 (*) 43 - 77 %    Neutro Abs 20.6 (*) 1.7 - 7.7 K/uL    Lymphocytes Relative 3 (*) 12 - 46 %    Lymphs Abs 0.6 (*) 0.7 - 4.0 K/uL    Monocytes Relative 2 (*) 3 - 12 %    Monocytes Absolute 0.5  0.1 - 1.0 K/uL    Eosinophils Relative 0  0 - 5 %    Eosinophils Absolute 0.1  0.0 - 0.7 K/uL    Basophils Relative 0  0 - 1 %    Basophils Absolute 0.0  0.0 - 0.1 K/uL   COMPREHENSIVE METABOLIC PANEL     Status: Abnormal   Collection Time   04/17/12  6:52 AM      Component Value Range Comment   Sodium 135  135 - 145 mEq/L    Potassium 4.8  3.5 - 5.1 mEq/L    Chloride 99  96 - 112 mEq/L    CO2 24  19 -  32 mEq/L    Glucose, Bld 399 (*) 70 - 99 mg/dL    BUN 22  6 - 23 mg/dL    Creatinine, Ser 6.29  0.50 - 1.10 mg/dL    Calcium 9.2  8.4 - 52.8 mg/dL    Total Protein 6.5  6.0 - 8.3 g/dL    Albumin 3.2 (*) 3.5 - 5.2 g/dL    AST 15  0 - 37 U/L    ALT 9  0 - 35 U/L    Alkaline Phosphatase  84  39 - 117 U/L    Total Bilirubin 0.3  0.3 - 1.2 mg/dL    GFR calc non Af Amer 54 (*) >90 mL/min    GFR calc Af Amer 63 (*) >90 mL/min   URINALYSIS, ROUTINE W REFLEX MICROSCOPIC     Status: Abnormal   Collection Time   04/17/12  8:35 AM      Component Value Range Comment   Color, Urine YELLOW  YELLOW    APPearance CLEAR  CLEAR    Specific Gravity, Urine 1.016  1.005 - 1.030    pH 5.5  5.0 - 8.0    Glucose, UA >1000 (*) NEGATIVE mg/dL    Hgb urine dipstick NEGATIVE  NEGATIVE    Bilirubin Urine NEGATIVE  NEGATIVE    Ketones, ur NEGATIVE  NEGATIVE mg/dL    Protein, ur NEGATIVE  NEGATIVE mg/dL    Urobilinogen, UA 0.2  0.0 - 1.0 mg/dL    Nitrite NEGATIVE  NEGATIVE    Leukocytes, UA NEGATIVE  NEGATIVE   URINE MICROSCOPIC-ADD ON     Status: Normal   Collection Time   04/17/12  8:35 AM      Component Value Range Comment   Squamous Epithelial / LPF RARE  RARE    WBC, UA 0-2  <3 WBC/hpf    RBC / HPF 0-2  <3 RBC/hpf    Bacteria, UA RARE  RARE   GLUCOSE, CAPILLARY     Status: Abnormal   Collection Time   04/17/12  9:02 AM      Component Value Range Comment   Glucose-Capillary 258 (*) 70 - 99 mg/dL    Comment 1 Notify RN       Radiological Exams on Admission: *RADIOLOGY REPORT*  Clinical Data: Low back pain. Abdominal pain. Vomiting and  diarrhea.  ACUTE ABDOMEN SERIES (ABDOMEN 2 VIEW & CHEST 1 VIEW)  Comparison: 01/24/2011.  Findings: Left perihilar airspace disease is present, most  compatible with pneumonia. No effusion. Cardiopericardial  silhouette appears within normal limits. Partially visualized  right proximal humeral fixation hardware. No intra-abdominal free  air. Scattered loops of  mildly dilated small bowel present  centrally, with thickening of the valvulae. Findings could be  associated with enteritis or partial small bowel obstruction. Gas  is present in the proximal sigmoid.  IMPRESSION:  1. Left perihilar airspace disease most compatible with pneumonia.  Follow-up to ensure clearing recommended.  2. A few loops of mildly dilated small bowel with air-fluid levels  and mural thickening can be associated with small bowel obstruction  or enteritis.  Original Report Authenticated By: Andreas Newport, M.D.   Assessment/Plan Active Problems:   1. Aspiration pneumonia: Patient presented with vomiting and diarrhea, from about 1:00 AM on 04/17/12, without antecedent sick contacts or cough. CXR shows left perihilar airspace disease most compatible with pneumonia. Wcc is elevated, and patient has a low grade pyrexia. The likely etiology is aspiration, for which we shall treat patient with iv Zosyn, however, will add Azithromycin, to cover atypical pathogens. Symptomatic/supportive treatment will be instituted.  2. Gastroenteritis: Diarrhea and vomiting, as well as abdominal X-Ray findings of few loops of mildly dilated small bowel with air-fluid levels  and mural thickening, are consistent with an acute gastroenteritis. Etiology is unclear, and may be viral or due to food poisoning, as there is no history of sick contacts or exotic travel. Will manage with iv fluids, H2RA, antiemetics, and bowel rest. Stool studies will be sent off, and Lipase requested for  completeness.  3. Dehydration: This is evidenced by an elevated BUN/Creatinine ratio, and is secondary to GI fluid loss. Managing with iv fluids. Lasix is on hold.  4. 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 CBGs are elevated at this time. Will mange with SSI for now.  5. HTN (hypertension): Controlled. Monitor and address as indicated.  6. Query TIA: Patient had a transient right ,lower monoparesis, overnight, but is now  asymptomatic. She does have risk factors for cerebrovascular disease. We shall place on telemetry, carry out TIA work up. Antiplatelet medication will be commenced, when able.   Further management will depend on clinical course.   Comment: Patient is FULL CODE.   Note: Main contacts: Son Analei Whinery [phone] and daughter Kari Baars [phone].    Time Spent on Admission: 1 hour.   Saul Fabiano,CHRISTOPHER 04/17/2012, 11:33 AM

## 2012-04-17 NOTE — Progress Notes (Signed)
  Echocardiogram 2D Echocardiogram has been performed.  Sabrina Baker 04/17/2012, 3:18 PM

## 2012-04-18 ENCOUNTER — Inpatient Hospital Stay (HOSPITAL_COMMUNITY): Payer: Medicare Other

## 2012-04-18 ENCOUNTER — Encounter (HOSPITAL_COMMUNITY): Payer: Self-pay | Admitting: General Practice

## 2012-04-18 DIAGNOSIS — I10 Essential (primary) hypertension: Secondary | ICD-10-CM

## 2012-04-18 DIAGNOSIS — R112 Nausea with vomiting, unspecified: Secondary | ICD-10-CM

## 2012-04-18 DIAGNOSIS — R197 Diarrhea, unspecified: Secondary | ICD-10-CM

## 2012-04-18 DIAGNOSIS — R7309 Other abnormal glucose: Secondary | ICD-10-CM

## 2012-04-18 LAB — RAPID URINE DRUG SCREEN, HOSP PERFORMED
Amphetamines: NOT DETECTED
Cocaine: NOT DETECTED
Opiates: NOT DETECTED
Tetrahydrocannabinol: NOT DETECTED

## 2012-04-18 LAB — CBC
MCV: 95.8 fL (ref 78.0–100.0)
Platelets: 296 10*3/uL (ref 150–400)
RBC: 3.55 MIL/uL — ABNORMAL LOW (ref 3.87–5.11)
RDW: 13.5 % (ref 11.5–15.5)
WBC: 25.9 10*3/uL — ABNORMAL HIGH (ref 4.0–10.5)

## 2012-04-18 LAB — LIPID PANEL
Cholesterol: 134 mg/dL (ref 0–200)
Triglycerides: 72 mg/dL (ref <150)
VLDL: 14 mg/dL (ref 0–40)

## 2012-04-18 LAB — BASIC METABOLIC PANEL
CO2: 28 mEq/L (ref 19–32)
Chloride: 104 mEq/L (ref 96–112)
GFR calc Af Amer: 54 mL/min — ABNORMAL LOW (ref >90)
Potassium: 4.7 mEq/L (ref 3.5–5.1)
Sodium: 140 mEq/L (ref 135–145)

## 2012-04-18 LAB — GLUCOSE, CAPILLARY
Glucose-Capillary: 156 mg/dL — ABNORMAL HIGH (ref 70–99)
Glucose-Capillary: 176 mg/dL — ABNORMAL HIGH (ref 70–99)

## 2012-04-18 LAB — HEMOGLOBIN A1C: Mean Plasma Glucose: 166 mg/dL — ABNORMAL HIGH (ref <117)

## 2012-04-18 LAB — LIPASE, BLOOD: Lipase: 31 U/L (ref 11–59)

## 2012-04-18 LAB — CLOSTRIDIUM DIFFICILE BY PCR: Toxigenic C. Difficile by PCR: NEGATIVE

## 2012-04-18 MED ORDER — ATORVASTATIN CALCIUM 20 MG PO TABS
20.0000 mg | ORAL_TABLET | Freq: Every day | ORAL | Status: DC
  Administered 2012-04-18 – 2012-04-20 (×3): 20 mg via ORAL
  Filled 2012-04-18 (×4): qty 1

## 2012-04-18 MED ORDER — PIOGLITAZONE HCL 30 MG PO TABS
30.0000 mg | ORAL_TABLET | Freq: Every evening | ORAL | Status: DC
  Administered 2012-04-18 – 2012-04-20 (×3): 30 mg via ORAL
  Filled 2012-04-18 (×4): qty 1

## 2012-04-18 MED ORDER — PIPERACILLIN-TAZOBACTAM 3.375 G IVPB
3.3750 g | Freq: Three times a day (TID) | INTRAVENOUS | Status: DC
  Administered 2012-04-18 – 2012-04-20 (×6): 3.375 g via INTRAVENOUS
  Filled 2012-04-18 (×8): qty 50

## 2012-04-18 MED ORDER — DULOXETINE HCL 30 MG PO CPEP
30.0000 mg | ORAL_CAPSULE | Freq: Two times a day (BID) | ORAL | Status: DC
  Administered 2012-04-18 – 2012-04-21 (×7): 30 mg via ORAL
  Filled 2012-04-18 (×8): qty 1

## 2012-04-18 MED ORDER — AMLODIPINE-ATORVASTATIN 10-20 MG PO TABS: 1.0000 | ORAL_TABLET | Freq: Every day | ORAL | Status: DC

## 2012-04-18 MED ORDER — INSULIN ASPART 100 UNIT/ML ~~LOC~~ SOLN
0.0000 [IU] | Freq: Every day | SUBCUTANEOUS | Status: DC
  Administered 2012-04-18: 2 [IU] via SUBCUTANEOUS
  Administered 2012-04-19: 5 [IU] via SUBCUTANEOUS

## 2012-04-18 MED ORDER — INSULIN ASPART 100 UNIT/ML ~~LOC~~ SOLN
0.0000 [IU] | Freq: Three times a day (TID) | SUBCUTANEOUS | Status: DC
  Administered 2012-04-18 – 2012-04-19 (×4): 2 [IU] via SUBCUTANEOUS

## 2012-04-18 MED ORDER — IRBESARTAN 150 MG PO TABS
150.0000 mg | ORAL_TABLET | Freq: Every day | ORAL | Status: DC
  Administered 2012-04-18 – 2012-04-20 (×3): 150 mg via ORAL
  Filled 2012-04-18 (×4): qty 1

## 2012-04-18 MED ORDER — FERROUS SULFATE 325 (65 FE) MG PO TABS
325.0000 mg | ORAL_TABLET | Freq: Three times a day (TID) | ORAL | Status: DC
  Administered 2012-04-18 – 2012-04-21 (×10): 325 mg via ORAL
  Filled 2012-04-18 (×12): qty 1

## 2012-04-18 MED ORDER — ASPIRIN 81 MG PO CHEW
81.0000 mg | CHEWABLE_TABLET | Freq: Every day | ORAL | Status: DC
  Administered 2012-04-18 – 2012-04-21 (×4): 81 mg via ORAL
  Filled 2012-04-18 (×3): qty 1

## 2012-04-18 MED ORDER — IRON 325 (65 FE) MG PO TABS
1.0000 | ORAL_TABLET | Freq: Three times a day (TID) | ORAL | Status: DC
  Filled 2012-04-18 (×3): qty 1

## 2012-04-18 MED ORDER — AMLODIPINE BESYLATE 10 MG PO TABS
10.0000 mg | ORAL_TABLET | Freq: Every day | ORAL | Status: DC
  Administered 2012-04-18 – 2012-04-21 (×4): 10 mg via ORAL
  Filled 2012-04-18 (×4): qty 1

## 2012-04-18 MED ORDER — PREDNISOLONE ACETATE 1 % OP SUSP
1.0000 [drp] | Freq: Two times a day (BID) | OPHTHALMIC | Status: DC
  Administered 2012-04-18 – 2012-04-21 (×6): 1 [drp] via OPHTHALMIC
  Filled 2012-04-18: qty 1

## 2012-04-18 NOTE — Progress Notes (Signed)
Patient complaining of a headache and exhibited a temperature around 8pm of 101.5 650 mg of Tylenol ordered for pain, which also lowered her temperature to 98.3. Will continue to monitor patient.

## 2012-04-18 NOTE — Progress Notes (Signed)
Patient ID: Sabrina Baker  female  AVW:[id number]    DOB: 1940/06/25    DOA: 04/17/2012  PCP: Cala Bradford, MD  Assessment/Plan: Active Problems:  Aspiration pneumonia with leukocytosis: - obtain blood cultures - Continue IV Zosyn per pharmacy   Gastroenteritis: Symptoms significantly improved, C. difficile negative - Advance diet, abdominal x-ray done today shows nonobstructive bowel gas pattern   Dehydration: - Continue gentle hydration   Diabetes mellitus - Continue Actos, sliding scale insulin   HTN (hypertension) - Restart home medications   TIA (transient ischemic attack): Symptoms resolved - Continue aspirin, statin - MRI of the brain negative for any acute CVA - 2-D echo with EF of 60-65%, no wall motion abnormalities - Carotid Dopplers with no ICA stenosis  DVT Prophylaxis:  Code Status:  Disposition: hopefully tomorrow    Subjective: Feeling better, tolerating clears. Right leg numbness resolved  Objective: Weight change:   Intake/Output Summary (Last 24 hours) at 04/18/12 1132 Last data filed at 04/18/12 0200  Gross per 24 hour  Intake 173.75 ml  Output   1000 ml  Net -826.25 ml   Blood pressure 150/78, pulse 90, temperature 99.1 F (37.3 C), temperature source Oral, resp. rate 18, height 5\' 4"  (1.626 m), weight 71.033 kg (156 lb 9.6 oz), SpO2 97.00%.  Physical Exam: General: Alert and awake, oriented x3, not in any acute distress. HEENT: anicteric sclera, pupils reactive to light and accommodation, EOMI CVS: S1-S2 clear, no murmur rubs or gallops Chest: clear to auscultation bilaterally, no wheezing, rales or rhonchi Abdomen: soft nontender, nondistended, normal bowel sounds, no organomegaly Extremities: no cyanosis, clubbing or edema noted bilaterally Neuro: Cranial nerves II-XII intact, no focal neurological deficits  Lab Results: Basic Metabolic Panel:  Lab 04/18/12 8295 04/17/12 1240 04/17/12 0652  NA 140 -- 135  K 4.7 -- 4.8  CL 104  -- 99  CO2 28 -- 24  GLUCOSE 121* -- 399*  BUN 22 -- 22  CREATININE 1.16* 1.11* --  CALCIUM 8.8 -- 9.2  MG -- -- --  PHOS -- -- --   Liver Function Tests:  Lab 04/17/12 0652  AST 15  ALT 9  ALKPHOS 84  BILITOT 0.3  PROT 6.5  ALBUMIN 3.2*    Lab 04/18/12 0540 04/17/12 1240  LIPASE 31 30  AMYLASE -- --   No results found for this basename: AMMONIA:2 in the last 168 hours CBC:  Lab 04/18/12 0540 04/17/12 1240 04/17/12 0652  WBC 25.9* 26.2* --  NEUTROABS -- -- 20.6*  HGB 10.8* 10.4* --  HCT 34.0* 32.6* --  MCV 95.8 94.8 --  PLT 296 317 --   Cardiac Enzymes: No results found for this basename: CKTOTAL:3,CKMB:3,CKMBINDEX:3,TROPONINI:3 in the last 168 hours BNP: No components found with this basename: POCBNP:2 CBG:  Lab 04/18/12 0739 04/18/12 0417 04/18/12 0016 04/17/12 2056 04/17/12 1700  GLUCAP 144* 89 156* 141* 152*     Micro Results: Recent Results (from the past 240 hour(s))  CLOSTRIDIUM DIFFICILE BY PCR     Status: Normal   Collection Time   04/18/12  6:44 AM      Component Value Range Status Comment   C difficile by pcr NEGATIVE  NEGATIVE Final     Studies/Results: Dg Abd 1 View  04/18/2012  *RADIOLOGY REPORT*  Clinical Data: Right-sided abdominal pain, evaluate for ileus  ABDOMEN - 1 VIEW  Comparison: CT abdomen pelvis - 06/24/2003  Findings:  Nonobstructive bowel gas pattern.  There is nonspecific mild gaseous distension of a  solitary short segment loop of small bowel with the left mid hemiabdomen measuring approximately 3 cm in diameter of doubtful clinical significance.  Evaluation for pneumoperitoneum is degraded secondary supine patient positioning and exclusion of the lower thorax.  No definite pneumatosis or portal venous gas.  No definite abnormal intra- abdominal calcifications.  Multilevel lumbar spine degenerative change.  IMPRESSION: Nonobstructive bowel gas pattern.   Original Report Authenticated By: Tacey Ruiz, MD    Ct Head Without  Contrast  04/17/2012  *RADIOLOGY REPORT*  Clinical Data: Headache.  CT HEAD WITHOUT CONTRAST  Technique:  Contiguous axial images were obtained from the base of the skull through the vertex without contrast.  Comparison: None.  Findings: Bony calvarium appears intact.  No mass effect or midline shift is noted.  Ventricular size is within normal limits.  There is no evidence of mass lesion, hemorrhage or acute infarction.  IMPRESSION: No gross intracranial abnormality seen.   Original Report Authenticated By: Lupita Raider.,  M.D.    Mri Brain Without Contrast  04/17/2012  *RADIOLOGY REPORT*  Clinical Data:  71 year old female with dizziness and left side numbness.  TIA.  Headache.  Comparison: Head CT without contrast 04/17/2012 at 1329 hours.  MRI HEAD WITHOUT CONTRAST  Technique: Multiplanar, multiecho pulse sequences of the brain and surrounding structures were obtained according to standard protocol without intravenous contrast.  Findings: Degenerative ligamentous hypertrophy about the odontoid narrows the ventral CSF space at the cervicomedullary junction, but there is no cervicomedullary stenosis. No restricted diffusion to suggest acute infarction.  No midline shift, mass effect, evidence of mass lesion, ventriculomegaly, extra-axial collection or acute intracranial hemorrhage.  Pituitary is within normal limits.  Major intracranial vascular flow voids are preserved.   Wallace Cullens and white matter signal is within normal limits throughout the brain.  Postoperative changes to the orbits. Visualized paranasal sinuses and mastoids are clear.  Visualized bone marrow signal is within normal limits.  Negative for age visualized cervical spine. Negative scalp soft tissues.  IMPRESSION: 1.Normal for age noncontrast MRI appearance of the brain. 2.  MRA findings below.  MRA HEAD WITHOUT CONTRAST  Technique: Angiographic images of the Circle of Willis were obtained using MRA technique without  intravenous contrast.   Findings: Study intermittently affected by artifact which may have been related to scanner malfunction at the time of imaging.  This results in a ghosting or duplication type artifact on some images.  Antegrade flow in the posterior circulation.  Mildly dominant distal left vertebral artery.  Normal right PICA.  Patent vertebrobasilar junction.  No basilar stenosis.  PCA origins are within normal limits.  Posterior communicating arteries are diminutive or absent.  Bilateral PCA branches are within normal limits.  Antegrade flow in both ICA siphons.  Cavernous and proximal supraclinoid segment irregularity greater on the right.  Mild right ICA siphon stenosis.  Carotid termini remain patent.  MCA and ACA origins are within normal limits.  There appears to be a median artery of the corpus callosum.  Visualized ACA branches are within normal limits.  MCA M1 segments are patent.  Visualized bilateral MCA branches are within normal limits.  IMPRESSION: Intracranial atherosclerosis most pronounced in the ICA siphons. Mild right ICA siphon stenosis.  Otherwise negative intracranial MRA; study intermittently affected by artifact related to scanner malfunction at the time of imaging.   Original Report Authenticated By: Erskine Speed, M.D.    Dg Abd Acute W/chest  04/17/2012  *RADIOLOGY REPORT*  Clinical Data: Low back pain.  Abdominal pain.  Vomiting and diarrhea.  ACUTE ABDOMEN SERIES (ABDOMEN 2 VIEW & CHEST 1 VIEW)  Comparison: 01/24/2011.  Findings: Left perihilar airspace disease is present, most compatible with pneumonia.  No effusion.  Cardiopericardial silhouette appears within normal limits.  Partially visualized right proximal humeral fixation hardware.  No intra-abdominal free air.  Scattered loops of mildly dilated small bowel present centrally, with thickening of the valvulae.  Findings could be associated with enteritis or partial small bowel obstruction.  Gas is present in the proximal sigmoid.  IMPRESSION: 1.   Left perihilar airspace disease most compatible with pneumonia. Follow-up to ensure clearing recommended. 2.  A few loops of mildly dilated small bowel with air-fluid levels and mural thickening can be associated with small bowel obstruction or enteritis.   Original Report Authenticated By: Andreas Newport, M.D.    Mr Mra Head/brain Wo Cm  04/17/2012  *RADIOLOGY REPORT*  Clinical Data:  71 year old female with dizziness and left side numbness.  TIA.  Headache.  Comparison: Head CT without contrast 04/17/2012 at 1329 hours.  MRI HEAD WITHOUT CONTRAST  Technique: Multiplanar, multiecho pulse sequences of the brain and surrounding structures were obtained according to standard protocol without intravenous contrast.  Findings: Degenerative ligamentous hypertrophy about the odontoid narrows the ventral CSF space at the cervicomedullary junction, but there is no cervicomedullary stenosis. No restricted diffusion to suggest acute infarction.  No midline shift, mass effect, evidence of mass lesion, ventriculomegaly, extra-axial collection or acute intracranial hemorrhage.  Pituitary is within normal limits.  Major intracranial vascular flow voids are preserved.   Wallace Cullens and white matter signal is within normal limits throughout the brain.  Postoperative changes to the orbits. Visualized paranasal sinuses and mastoids are clear.  Visualized bone marrow signal is within normal limits.  Negative for age visualized cervical spine. Negative scalp soft tissues.  IMPRESSION: 1.Normal for age noncontrast MRI appearance of the brain. 2.  MRA findings below.  MRA HEAD WITHOUT CONTRAST  Technique: Angiographic images of the Circle of Willis were obtained using MRA technique without  intravenous contrast.  Findings: Study intermittently affected by artifact which may have been related to scanner malfunction at the time of imaging.  This results in a ghosting or duplication type artifact on some images.  Antegrade flow in the posterior  circulation.  Mildly dominant distal left vertebral artery.  Normal right PICA.  Patent vertebrobasilar junction.  No basilar stenosis.  PCA origins are within normal limits.  Posterior communicating arteries are diminutive or absent.  Bilateral PCA branches are within normal limits.  Antegrade flow in both ICA siphons.  Cavernous and proximal supraclinoid segment irregularity greater on the right.  Mild right ICA siphon stenosis.  Carotid termini remain patent.  MCA and ACA origins are within normal limits.  There appears to be a median artery of the corpus callosum.  Visualized ACA branches are within normal limits.  MCA M1 segments are patent.  Visualized bilateral MCA branches are within normal limits.  IMPRESSION: Intracranial atherosclerosis most pronounced in the ICA siphons. Mild right ICA siphon stenosis.  Otherwise negative intracranial MRA; study intermittently affected by artifact related to scanner malfunction at the time of imaging.   Original Report Authenticated By: Erskine Speed, M.D.     Medications: Scheduled Meds:   . amLODipine  10 mg Oral Daily   And  . atorvastatin  20 mg Oral q1800  . aspirin  81 mg Oral Daily  . [COMPLETED] cefTRIAXone (ROCEPHIN)  IV  1 g Intravenous  Once  . DULoxetine  30 mg Oral BID  . famotidine (PEPCID) IV  20 mg Intravenous Q12H  . ferrous sulfate  325 mg Oral TID WC  . insulin aspart  0-9 Units Subcutaneous Q4H  . pioglitazone  30 mg Oral QPM  . piperacillin-tazobactam (ZOSYN)  IV  3.375 g Intravenous Q8H  . sodium chloride  500 mL Intravenous Once  . [DISCONTINUED] amlodipine-atorvastatin  1 tablet Oral QHS  . [DISCONTINUED] heparin  5,000 Units Subcutaneous Q8H  . [DISCONTINUED] Iron  1 tablet Oral TID      LOS: 1 day   Shaterica Mcclatchy M.D. [facility] 04/18/2012, 11:32 AM Pager: [phone]  If 7PM-7AM, please contact night-coverage [email or web address] Password [email or web address]

## 2012-04-18 NOTE — Progress Notes (Signed)
Occupational Therapy Discharge Patient Details Name: Sabrina Baker MRN: [id number] DOB: [birth year] Today's Date: 04/18/2012   Pt at baseline level of functioning and able to perform dressing and grooming with Mod I per PT. No OT eval indicated. Will sign off. Thank you. Glendale Chard, OTR/L Pager: [phone] 04/18/2012   [name] 04/18/2012, 2:47 PM

## 2012-04-18 NOTE — Evaluation (Signed)
Physical Therapy Evaluation Patient Details Name: Sabrina Baker MRN: [id number] DOB: [birth year] Today's Date: 04/18/2012 Time: 8295-6213 PT Time Calculation (min): 23 min  PT Assessment / Plan / Recommendation Clinical Impression  Pt admitted with vomiting, diarrhea, PNA and left sided weakness. Left weakness completely resolved without deficit. Pt very active with local school, water aerobics and calls bingo at SNF nearby. Pt able to verbalize safety precautions for preventing falls and has all necessary equipment at home. Pt maintained 95% on RA with long hall ambulation and 96% on RA at rest. Pt at baseline without further therapy needs and agreeable to no needs. Recommend daily hall ambulation. Signing off.     PT Assessment  Patent does not need any further PT services    Follow Up Recommendations  No PT follow up    Does the patient have the potential to tolerate intense rehabilitation      Barriers to Discharge        Equipment Recommendations  None recommended by PT    Recommendations for Other Services     Frequency      Precautions / Restrictions Precautions Precautions: Fall   Pertinent Vitals/Pain No pain RA 95%      Mobility  Bed Mobility Bed Mobility: Supine to Sit Supine to Sit: 6: Modified independent (Device/Increase time);HOB flat Transfers Transfers: Sit to Stand;Stand to Sit Sit to Stand: 6: Modified independent (Device/Increase time) Stand to Sit: 6: Modified independent (Device/Increase time) Ambulation/Gait Ambulation/Gait Assistance: 6: Modified independent (Device/Increase time) Ambulation Distance (Feet): 500 Feet Assistive device: None;Straight cane Ambulation/Gait Assistance Details: Pt using cane at times and other times just picks it up to carry it. No significant need for cane but always has it as a precaution Gait Pattern: Within Functional Limits Gait velocity: WFL Stairs: No Modified Rankin (Stroke Patients Only) Pre-Morbid  Rankin Score: No symptoms Modified Rankin: No symptoms    Shoulder Instructions     Exercises     PT Diagnosis:    PT Problem List:   PT Treatment Interventions:     PT Goals    Visit Information  Last PT Received On: 04/18/12 Assistance Needed: +1    Subjective Data  Subjective: I just carry my cane and don't really use it unless I'm going to a ball game Patient Stated Goal: "get back to watching all the kids play" - pt attends all sporting events for local school   Prior Functioning  Home Living Lives With: Alone Type of Home: House Home Access: Ramped entrance Home Layout: Two level;Able to live on main level with bedroom/bathroom Bathroom Shower/Tub: Engineer, manufacturing systems: Standard Home Adaptive Equipment: Bedside commode/3-in-1;Shower chair with back;Walker - standard;Straight cane Prior Function Level of Independence: Independent Able to Take Stairs?: Yes (only with a  rail) Driving: Yes Vocation: Retired Musician: No difficulties Dominant Hand: Right    Cognition  Overall Cognitive Status: Appears within functional limits for tasks assessed/performed Arousal/Alertness: Awake/alert Orientation Level: Appears intact for tasks assessed Behavior During Session: [facility] for tasks performed    Extremity/Trunk Assessment Right Upper Extremity Assessment RUE ROM/Strength/Tone: Within functional levels Left Upper Extremity Assessment LUE ROM/Strength/Tone: Within functional levels Right Lower Extremity Assessment RLE ROM/Strength/Tone: Within functional levels (pt with 5/5 strength bilaterally, sensation equal, bil charcot foot deformity and wore orthopedic shoes for gait) Left Lower Extremity Assessment LLE ROM/Strength/Tone: Within functional levels Trunk Assessment Trunk Assessment: Normal   Balance    End of Session PT - End of Session Activity Tolerance: Patient tolerated  treatment well Patient left: in chair;with call bell/phone  within reach  GP     Toney Sang [facility] 04/18/2012, 11:57 AM  [name], PT [phone]

## 2012-04-19 ENCOUNTER — Encounter (HOSPITAL_COMMUNITY): Payer: Self-pay | Admitting: Radiology

## 2012-04-19 ENCOUNTER — Inpatient Hospital Stay (HOSPITAL_COMMUNITY): Payer: Medicare Other

## 2012-04-19 LAB — CBC
HCT: 31.4 % — ABNORMAL LOW (ref 36.0–46.0)
Hemoglobin: 9.9 g/dL — ABNORMAL LOW (ref 12.0–15.0)
MCH: 29.9 pg (ref 26.0–34.0)
MCHC: 31.5 g/dL (ref 30.0–36.0)
MCV: 94.9 fL (ref 78.0–100.0)
RBC: 3.31 MIL/uL — ABNORMAL LOW (ref 3.87–5.11)

## 2012-04-19 LAB — HEMOGLOBIN A1C
Hgb A1c MFr Bld: 7.3 % — ABNORMAL HIGH (ref <5.7)
Mean Plasma Glucose: 163 mg/dL — ABNORMAL HIGH (ref <117)

## 2012-04-19 LAB — BASIC METABOLIC PANEL
BUN: 18 mg/dL (ref 6–23)
CO2: 24 mEq/L (ref 19–32)
Calcium: 8.5 mg/dL (ref 8.4–10.5)
GFR calc non Af Amer: 49 mL/min — ABNORMAL LOW (ref >90)
Glucose, Bld: 196 mg/dL — ABNORMAL HIGH (ref 70–99)

## 2012-04-19 LAB — GLUCOSE, CAPILLARY
Glucose-Capillary: 168 mg/dL — ABNORMAL HIGH (ref 70–99)
Glucose-Capillary: 385 mg/dL — ABNORMAL HIGH (ref 70–99)

## 2012-04-19 MED ORDER — INSULIN ASPART 100 UNIT/ML ~~LOC~~ SOLN
0.0000 [IU] | Freq: Three times a day (TID) | SUBCUTANEOUS | Status: DC
  Administered 2012-04-20: 3 [IU] via SUBCUTANEOUS
  Administered 2012-04-20: 5 [IU] via SUBCUTANEOUS
  Administered 2012-04-21 (×2): 3 [IU] via SUBCUTANEOUS

## 2012-04-19 MED ORDER — INSULIN ASPART 100 UNIT/ML ~~LOC~~ SOLN: 0.0000 [IU] | Freq: Every day | SUBCUTANEOUS | Status: DC

## 2012-04-19 MED ORDER — FAMOTIDINE 20 MG PO TABS
20.0000 mg | ORAL_TABLET | Freq: Two times a day (BID) | ORAL | Status: DC
  Administered 2012-04-19 – 2012-04-20 (×4): 20 mg via ORAL
  Filled 2012-04-19 (×6): qty 1

## 2012-04-19 MED ORDER — IOHEXOL 300 MG/ML  SOLN
100.0000 mL | Freq: Once | INTRAMUSCULAR | Status: AC | PRN
  Administered 2012-04-19: 100 mL via INTRAVENOUS

## 2012-04-19 NOTE — Progress Notes (Signed)
Patient ID: DEBORA STOCKDALE  female  ZOX:[id number]    DOB: [birth year]    DOA: 04/17/2012  PCP: Cala Bradford, MD  Assessment/Plan: Active Problems:  Aspiration pneumonia with leukocytosis: - blood cultures so far negative - Continue IV Zosyn per pharmacy   Gastroenteritis: Symptoms significantly improved, C. difficile negative - tolerating solids, wbc trending down, but c/o right lower quadrant abd pain persisting - CT abd and pelvis ordered, likely colitis   Dehydration:resolved   Diabetes mellitus - Continue Actos, sliding scale insulin, inc to moderate   HTN (hypertension) - Restart home medications   TIA (transient ischemic attack): Symptoms resolved - Continue aspirin, statin - MRI of the brain negative for any acute CVA - 2-D echo with EF of 60-65%, no wall motion abnormalities - Carotid Dopplers with no ICA stenosis  DVT Prophylaxis: SCD's  Code Status: Full  Disposition: hopefully tomorrow    Subjective: Feeling better, Right leg numbness resolved, still c/o right lower quadrant abd pain  Objective: Weight change:   Intake/Output Summary (Last 24 hours) at 04/19/12 1413 Last data filed at 04/19/12 0838  Gross per 24 hour  Intake    520 ml  Output      0 ml  Net    520 ml   Blood pressure 151/78, pulse 84, temperature 98.8 F (37.1 C), temperature source Oral, resp. rate 18, height 5\' 4"  (1.626 m), weight 71.033 kg (156 lb 9.6 oz), SpO2 96.00%.  Physical Exam: General: Alert and awake, oriented x3, not in any acute distress. HEENT: anicteric sclera, pupils reactive to light and accommodation, EOMI CVS: S1-S2 clear, no murmur rubs or gallops Chest: clear to auscultation bilaterally, no wheezing, rales or rhonchi Abdomen: soft mildly tender in RLQ, nondistended, normal bowel sounds, no organomegaly Extremities: no cyanosis, clubbing or edema noted bilaterally   Lab Results: Basic Metabolic Panel:  Lab 04/19/12 8119 04/18/12 0540  NA 140 140  K 4.4  4.7  CL 107 104  CO2 24 28  GLUCOSE 196* 121*  BUN 18 22  CREATININE 1.11* 1.16*  CALCIUM 8.5 8.8  MG -- --  PHOS -- --   Liver Function Tests:  Lab 04/17/12 0652  AST 15  ALT 9  ALKPHOS 84  BILITOT 0.3  PROT 6.5  ALBUMIN 3.2*    Lab 04/18/12 0540 04/17/12 1240  LIPASE 31 30  AMYLASE -- --   No results found for this basename: AMMONIA:2 in the last 168 hours CBC:  Lab 04/19/12 0536 04/18/12 0540 04/17/12 0652  WBC 17.0* 25.9* --  NEUTROABS -- -- 20.6*  HGB 9.9* 10.8* --  HCT 31.4* 34.0* --  MCV 94.9 95.8 --  PLT 278 296 --   Cardiac Enzymes: No results found for this basename: CKTOTAL:3,CKMB:3,CKMBINDEX:3,TROPONINI:3 in the last 168 hours BNP: No components found with this basename: POCBNP:2 CBG:  Lab 04/19/12 1140 04/19/12 0738 04/18/12 2238 04/18/12 1643 04/18/12 1125  GLUCAP 160* 168* 208* 176* 171*     Micro Results: Recent Results (from the past 240 hour(s))  CLOSTRIDIUM DIFFICILE BY PCR     Status: Normal   Collection Time   04/18/12  6:44 AM      Component Value Range Status Comment   C difficile by pcr NEGATIVE  NEGATIVE Final   CULTURE, BLOOD (ROUTINE X 2)     Status: Normal (Preliminary result)   Collection Time   04/18/12  9:20 AM      Component Value Range Status Comment   Specimen Description BLOOD LEFT  ARM   Final    Special Requests BOTTLES DRAWN AEROBIC AND ANAEROBIC 10CC   Final    Culture  Setup Time 04/18/2012 16:52   Final    Culture     Final    Value:        BLOOD CULTURE RECEIVED NO GROWTH TO DATE CULTURE WILL BE HELD FOR 5 DAYS BEFORE ISSUING A FINAL NEGATIVE REPORT   Report Status PENDING   Incomplete   CULTURE, BLOOD (ROUTINE X 2)     Status: Normal (Preliminary result)   Collection Time   04/18/12  9:22 AM      Component Value Range Status Comment   Specimen Description BLOOD LEFT HAND   Final    Special Requests BOTTLES DRAWN AEROBIC AND ANAEROBIC 10CC   Final    Culture  Setup Time 04/18/2012 16:52   Final    Culture      Final    Value:        BLOOD CULTURE RECEIVED NO GROWTH TO DATE CULTURE WILL BE HELD FOR 5 DAYS BEFORE ISSUING A FINAL NEGATIVE REPORT   Report Status PENDING   Incomplete     Studies/Results: Dg Abd 1 View  04/18/2012  *RADIOLOGY REPORT*  Clinical Data: Right-sided abdominal pain, evaluate for ileus  ABDOMEN - 1 VIEW  Comparison: CT abdomen pelvis - 06/24/2003  Findings:  Nonobstructive bowel gas pattern.  There is nonspecific mild gaseous distension of a solitary short segment loop of small bowel with the left mid hemiabdomen measuring approximately 3 cm in diameter of doubtful clinical significance.  Evaluation for pneumoperitoneum is degraded secondary supine patient positioning and exclusion of the lower thorax.  No definite pneumatosis or portal venous gas.  No definite abnormal intra- abdominal calcifications.  Multilevel lumbar spine degenerative change.  IMPRESSION: Nonobstructive bowel gas pattern.   Original Report Authenticated By: Tacey Ruiz, MD    Ct Head Without Contrast  04/17/2012  *RADIOLOGY REPORT*  Clinical Data: Headache.  CT HEAD WITHOUT CONTRAST  Technique:  Contiguous axial images were obtained from the base of the skull through the vertex without contrast.  Comparison: None.  Findings: Bony calvarium appears intact.  No mass effect or midline shift is noted.  Ventricular size is within normal limits.  There is no evidence of mass lesion, hemorrhage or acute infarction.  IMPRESSION: No gross intracranial abnormality seen.   Original Report Authenticated By: Lupita Raider.,  M.D.    Mri Brain Without Contrast  04/17/2012  *RADIOLOGY REPORT*  Clinical Data:  71 year old female with dizziness and left side numbness.  TIA.  Headache.  Comparison: Head CT without contrast 04/17/2012 at 1329 hours.  MRI HEAD WITHOUT CONTRAST  Technique: Multiplanar, multiecho pulse sequences of the brain and surrounding structures were obtained according to standard protocol without intravenous  contrast.  Findings: Degenerative ligamentous hypertrophy about the odontoid narrows the ventral CSF space at the cervicomedullary junction, but there is no cervicomedullary stenosis. No restricted diffusion to suggest acute infarction.  No midline shift, mass effect, evidence of mass lesion, ventriculomegaly, extra-axial collection or acute intracranial hemorrhage.  Pituitary is within normal limits.  Major intracranial vascular flow voids are preserved.   Wallace Cullens and white matter signal is within normal limits throughout the brain.  Postoperative changes to the orbits. Visualized paranasal sinuses and mastoids are clear.  Visualized bone marrow signal is within normal limits.  Negative for age visualized cervical spine. Negative scalp soft tissues.  IMPRESSION: 1.Normal for age noncontrast MRI appearance  of the brain. 2.  MRA findings below.  MRA HEAD WITHOUT CONTRAST  Technique: Angiographic images of the Circle of Willis were obtained using MRA technique without  intravenous contrast.  Findings: Study intermittently affected by artifact which may have been related to scanner malfunction at the time of imaging.  This results in a ghosting or duplication type artifact on some images.  Antegrade flow in the posterior circulation.  Mildly dominant distal left vertebral artery.  Normal right PICA.  Patent vertebrobasilar junction.  No basilar stenosis.  PCA origins are within normal limits.  Posterior communicating arteries are diminutive or absent.  Bilateral PCA branches are within normal limits.  Antegrade flow in both ICA siphons.  Cavernous and proximal supraclinoid segment irregularity greater on the right.  Mild right ICA siphon stenosis.  Carotid termini remain patent.  MCA and ACA origins are within normal limits.  There appears to be a median artery of the corpus callosum.  Visualized ACA branches are within normal limits.  MCA M1 segments are patent.  Visualized bilateral MCA branches are within normal  limits.  IMPRESSION: Intracranial atherosclerosis most pronounced in the ICA siphons. Mild right ICA siphon stenosis.  Otherwise negative intracranial MRA; study intermittently affected by artifact related to scanner malfunction at the time of imaging.   Original Report Authenticated By: Erskine Speed, M.D.    Dg Abd Acute W/chest  04/17/2012  *RADIOLOGY REPORT*  Clinical Data: Low back pain.  Abdominal pain.  Vomiting and diarrhea.  ACUTE ABDOMEN SERIES (ABDOMEN 2 VIEW & CHEST 1 VIEW)  Comparison: 01/24/2011.  Findings: Left perihilar airspace disease is present, most compatible with pneumonia.  No effusion.  Cardiopericardial silhouette appears within normal limits.  Partially visualized right proximal humeral fixation hardware.  No intra-abdominal free air.  Scattered loops of mildly dilated small bowel present centrally, with thickening of the valvulae.  Findings could be associated with enteritis or partial small bowel obstruction.  Gas is present in the proximal sigmoid.  IMPRESSION: 1.  Left perihilar airspace disease most compatible with pneumonia. Follow-up to ensure clearing recommended. 2.  A few loops of mildly dilated small bowel with air-fluid levels and mural thickening can be associated with small bowel obstruction or enteritis.   Original Report Authenticated By: Andreas Newport, M.D.    Mr Mra Head/brain Wo Cm  04/17/2012  *RADIOLOGY REPORT*  Clinical Data:  71 year old female with dizziness and left side numbness.  TIA.  Headache.  Comparison: Head CT without contrast 04/17/2012 at 1329 hours.  MRI HEAD WITHOUT CONTRAST  Technique: Multiplanar, multiecho pulse sequences of the brain and surrounding structures were obtained according to standard protocol without intravenous contrast.  Findings: Degenerative ligamentous hypertrophy about the odontoid narrows the ventral CSF space at the cervicomedullary junction, but there is no cervicomedullary stenosis. No restricted diffusion to suggest acute  infarction.  No midline shift, mass effect, evidence of mass lesion, ventriculomegaly, extra-axial collection or acute intracranial hemorrhage.  Pituitary is within normal limits.  Major intracranial vascular flow voids are preserved.   Wallace Cullens and white matter signal is within normal limits throughout the brain.  Postoperative changes to the orbits. Visualized paranasal sinuses and mastoids are clear.  Visualized bone marrow signal is within normal limits.  Negative for age visualized cervical spine. Negative scalp soft tissues.  IMPRESSION: 1.Normal for age noncontrast MRI appearance of the brain. 2.  MRA findings below.  MRA HEAD WITHOUT CONTRAST  Technique: Angiographic images of the Circle of Willis were obtained using MRA technique  without  intravenous contrast.  Findings: Study intermittently affected by artifact which may have been related to scanner malfunction at the time of imaging.  This results in a ghosting or duplication type artifact on some images.  Antegrade flow in the posterior circulation.  Mildly dominant distal left vertebral artery.  Normal right PICA.  Patent vertebrobasilar junction.  No basilar stenosis.  PCA origins are within normal limits.  Posterior communicating arteries are diminutive or absent.  Bilateral PCA branches are within normal limits.  Antegrade flow in both ICA siphons.  Cavernous and proximal supraclinoid segment irregularity greater on the right.  Mild right ICA siphon stenosis.  Carotid termini remain patent.  MCA and ACA origins are within normal limits.  There appears to be a median artery of the corpus callosum.  Visualized ACA branches are within normal limits.  MCA M1 segments are patent.  Visualized bilateral MCA branches are within normal limits.  IMPRESSION: Intracranial atherosclerosis most pronounced in the ICA siphons. Mild right ICA siphon stenosis.  Otherwise negative intracranial MRA; study intermittently affected by artifact related to scanner malfunction at  the time of imaging.   Original Report Authenticated By: Erskine Speed, M.D.     Medications: Scheduled Meds:    . amLODipine  10 mg Oral Daily   And  . atorvastatin  20 mg Oral q1800  . aspirin  81 mg Oral Daily  . DULoxetine  30 mg Oral BID  . famotidine  20 mg Oral BID  . ferrous sulfate  325 mg Oral TID WC  . insulin aspart  0-5 Units Subcutaneous QHS  . insulin aspart  0-9 Units Subcutaneous TID WC  . irbesartan  150 mg Oral Daily  . pioglitazone  30 mg Oral QPM  . piperacillin-tazobactam (ZOSYN)  IV  3.375 g Intravenous Q8H  . prednisoLONE acetate  1 drop Right Eye BID  . sodium chloride  500 mL Intravenous Once  . [DISCONTINUED] famotidine (PEPCID) IV  20 mg Intravenous Q12H      LOS: 2 days   Jeffey Janssen M.D. [facility] 04/19/2012, 2:13 PM Pager: [phone]  If 7PM-7AM, please contact night-coverage [email or web address] Password [email or web address]

## 2012-04-19 NOTE — Progress Notes (Signed)
PT Cancellation Note  Patient Details Name: Sabrina Baker MRN: [id number] DOB: [birth year]   Cancelled Treatment:    Reason Eval/Treat Not Completed:  (Please see PT evaluation  of 04/18/12 where PT signed off.)   INGOLD,[name] 04/19/2012, 10:32 AM [name] [phone] (pager)

## 2012-04-19 NOTE — Progress Notes (Signed)
ANTIBIOTIC CONSULT NOTE - Follow-Up  Pharmacy Consult for Zosyn Indication: Empiric aspiration PNA coverage  No Known Allergies  Labs:  [facility] 04/19/12 0536 04/18/12 0540 04/17/12 1240  WBC 17.0* 25.9* 26.2*  HGB 9.9* 10.8* 10.4*  PLT 278 296 317  LABCREA -- -- --  CREATININE 1.11* 1.16* 1.11*   Estimated Creatinine Clearance: 45.6 ml/min (by C-G formula based on Cr of 1.11). No results found for this basename: VANCOTROUGH:2,VANCOPEAK:2,VANCORANDOM:2,GENTTROUGH:2,GENTPEAK:2,GENTRANDOM:2,TOBRATROUGH:2,TOBRAPEAK:2,TOBRARND:2,AMIKACINPEAK:2,AMIKACINTROU:2,AMIKACIN:2, in the last 72 hours    Assessment: 71 y.o. F who [facility] on 11/[facility] with vomiting, diarrhea, cough, and fever. CXR revealed left perihilar airspace disease -- possible PNA. Pharmacy was consulted to start Zosyn for empiric aspiration PNA. SCr 1.02, estimated CrCl~60 ml/min.   Renal function is stable  Goal of Therapy:  Proper antibiotics for infection/cultures adjusted for renal/hepatic function   Plan:  1. Zosyn 3.375g IV every 8 hours 2. Pharmacy will sign off  Thank you. Okey Regal, PharmD  04/19/2012 10:08 AM

## 2012-04-20 LAB — BASIC METABOLIC PANEL
CO2: 22 mEq/L (ref 19–32)
Chloride: 104 mEq/L (ref 96–112)
Glucose, Bld: 184 mg/dL — ABNORMAL HIGH (ref 70–99)
Potassium: 5.4 mEq/L — ABNORMAL HIGH (ref 3.5–5.1)
Sodium: 137 mEq/L (ref 135–145)

## 2012-04-20 LAB — GLUCOSE, CAPILLARY
Glucose-Capillary: 166 mg/dL — ABNORMAL HIGH (ref 70–99)
Glucose-Capillary: 216 mg/dL — ABNORMAL HIGH (ref 70–99)

## 2012-04-20 LAB — CBC
Hemoglobin: 10.2 g/dL — ABNORMAL LOW (ref 12.0–15.0)
MCH: 29.9 pg (ref 26.0–34.0)
RBC: 3.41 MIL/uL — ABNORMAL LOW (ref 3.87–5.11)

## 2012-04-20 MED ORDER — LEVOFLOXACIN IN D5W 750 MG/150ML IV SOLN
750.0000 mg | INTRAVENOUS | Status: DC
  Filled 2012-04-20: qty 150

## 2012-04-20 MED ORDER — LOPERAMIDE HCL 2 MG PO CAPS: 2.0000 mg | ORAL_CAPSULE | ORAL | Status: DC | PRN

## 2012-04-20 MED ORDER — LEVOFLOXACIN IN D5W 750 MG/150ML IV SOLN
750.0000 mg | INTRAVENOUS | Status: DC
  Administered 2012-04-20: 750 mg via INTRAVENOUS
  Filled 2012-04-20: qty 150

## 2012-04-20 MED ORDER — SODIUM POLYSTYRENE SULFONATE 15 GM/60ML PO SUSP
30.0000 g | Freq: Once | ORAL | Status: AC
  Administered 2012-04-20: 30 g via ORAL
  Filled 2012-04-20: qty 120

## 2012-04-20 NOTE — Progress Notes (Signed)
Patient ID: Sabrina Baker  female  WUJ:[id number]    DOB: 1941-04-17    DOA: 04/17/2012  PCP: Cala Bradford, MD  Assessment/Plan: Active Problems:  Aspiration pneumonia with leukocytosis: - blood cultures so far negative, transition to levofloxacin   Gastroenteritis: Symptoms significantly improved, C. difficile negative - tolerating solids, wbc trending down  - CT abd and pelvis negative for colitis   Dehydration:resolved   Diabetes mellitus - Continue Actos, sliding scale insulin, inc to moderate   HTN (hypertension): stable   TIA (transient ischemic attack): Symptoms resolved - Continue aspirin, statin - MRI of the brain negative for any acute CVA - 2-D echo with EF of 60-65%, no wall motion abnormalities - Carotid Dopplers with no ICA stenosis  HyperK: kayexalate x1  DVT Prophylaxis: SCD's  Code Status: Full  Disposition: hopefully tomorrow    Subjective: Feeling better, Right leg numbness resolved,  WBC still 16K  Objective: Weight change:   Intake/Output Summary (Last 24 hours) at 04/20/12 1343 Last data filed at 04/20/12 0800  Gross per 24 hour  Intake    720 ml  Output      0 ml  Net    720 ml   Blood pressure 142/70, pulse 91, temperature 98.4 F (36.9 C), temperature source Oral, resp. rate 20, height 5\' 4"  (1.626 m), weight 71.033 kg (156 lb 9.6 oz), SpO2 100.00%.  Physical Exam: General: Alert and awake, oriented x3, not in any acute distress. HEENT: anicteric sclera, pupils reactive to light and accommodation, EOMI CVS: S1-S2 clear, no murmur rubs or gallops Chest: CTAB Abdomen: soft NT, ND, normal bowel sounds, no organomegaly Extremities: no cyanosis, clubbing or edema noted bilaterally   Lab Results: Basic Metabolic Panel:  Lab 04/20/12 9562 04/19/12 0536  NA 137 140  K 5.4* 4.4  CL 104 107  CO2 22 24  GLUCOSE 184* 196*  BUN 15 18  CREATININE 1.17* 1.11*  CALCIUM 8.8 8.5  MG -- --  PHOS -- --   Liver Function Tests:  Lab  04/17/12 0652  AST 15  ALT 9  ALKPHOS 84  BILITOT 0.3  PROT 6.5  ALBUMIN 3.2*    Lab 04/18/12 0540 04/17/12 1240  LIPASE 31 30  AMYLASE -- --   No results found for this basename: AMMONIA:2 in the last 168 hours CBC:  Lab 04/20/12 0700 04/19/12 0536 04/17/12 0652  WBC 16.0* 17.0* --  NEUTROABS -- -- 20.6*  HGB 10.2* 9.9* --  HCT 32.0* 31.4* --  MCV 93.8 94.9 --  PLT 334 278 --   Cardiac Enzymes: No results found for this basename: CKTOTAL:3,CKMB:3,CKMBINDEX:3,TROPONINI:3 in the last 168 hours BNP: No components found with this basename: POCBNP:2 CBG:  Lab 04/20/12 1143 04/20/12 0735 04/19/12 2215 04/19/12 2023 04/19/12 1702  GLUCAP 122* 166* 200* 385* 100*     Micro Results: Recent Results (from the past 240 hour(s))  CLOSTRIDIUM DIFFICILE BY PCR     Status: Normal   Collection Time   04/18/12  6:44 AM      Component Value Range Status Comment   C difficile by pcr NEGATIVE  NEGATIVE Final   CULTURE, BLOOD (ROUTINE X 2)     Status: Normal (Preliminary result)   Collection Time   04/18/12  9:20 AM      Component Value Range Status Comment   Specimen Description BLOOD LEFT ARM   Final    Special Requests BOTTLES DRAWN AEROBIC AND ANAEROBIC 10CC   Final    Culture  Setup Time 04/18/2012 16:52   Final    Culture     Final    Value:        BLOOD CULTURE RECEIVED NO GROWTH TO DATE CULTURE WILL BE HELD FOR 5 DAYS BEFORE ISSUING A FINAL NEGATIVE REPORT   Report Status PENDING   Incomplete   CULTURE, BLOOD (ROUTINE X 2)     Status: Normal (Preliminary result)   Collection Time   04/18/12  9:22 AM      Component Value Range Status Comment   Specimen Description BLOOD LEFT HAND   Final    Special Requests BOTTLES DRAWN AEROBIC AND ANAEROBIC 10CC   Final    Culture  Setup Time 04/18/2012 16:52   Final    Culture     Final    Value:        BLOOD CULTURE RECEIVED NO GROWTH TO DATE CULTURE WILL BE HELD FOR 5 DAYS BEFORE ISSUING A FINAL NEGATIVE REPORT   Report Status PENDING    Incomplete     Studies/Results: Dg Abd 1 View  04/18/2012  *RADIOLOGY REPORT*  Clinical Data: Right-sided abdominal pain, evaluate for ileus  ABDOMEN - 1 VIEW  Comparison: CT abdomen pelvis - 06/24/2003  Findings:  Nonobstructive bowel gas pattern.  There is nonspecific mild gaseous distension of a solitary short segment loop of small bowel with the left mid hemiabdomen measuring approximately 3 cm in diameter of doubtful clinical significance.  Evaluation for pneumoperitoneum is degraded secondary supine patient positioning and exclusion of the lower thorax.  No definite pneumatosis or portal venous gas.  No definite abnormal intra- abdominal calcifications.  Multilevel lumbar spine degenerative change.  IMPRESSION: Nonobstructive bowel gas pattern.   Original Report Authenticated By: Tacey Ruiz, MD    Ct Head Without Contrast  04/17/2012  *RADIOLOGY REPORT*  Clinical Data: Headache.  CT HEAD WITHOUT CONTRAST  Technique:  Contiguous axial images were obtained from the base of the skull through the vertex without contrast.  Comparison: None.  Findings: Bony calvarium appears intact.  No mass effect or midline shift is noted.  Ventricular size is within normal limits.  There is no evidence of mass lesion, hemorrhage or acute infarction.  IMPRESSION: No gross intracranial abnormality seen.   Original Report Authenticated By: Lupita Raider.,  M.D.    Mri Brain Without Contrast  04/17/2012  *RADIOLOGY REPORT*  Clinical Data:  71 year old female with dizziness and left side numbness.  TIA.  Headache.  Comparison: Head CT without contrast 04/17/2012 at 1329 hours.  MRI HEAD WITHOUT CONTRAST  Technique: Multiplanar, multiecho pulse sequences of the brain and surrounding structures were obtained according to standard protocol without intravenous contrast.  Findings: Degenerative ligamentous hypertrophy about the odontoid narrows the ventral CSF space at the cervicomedullary junction, but there is no  cervicomedullary stenosis. No restricted diffusion to suggest acute infarction.  No midline shift, mass effect, evidence of mass lesion, ventriculomegaly, extra-axial collection or acute intracranial hemorrhage.  Pituitary is within normal limits.  Major intracranial vascular flow voids are preserved.   Wallace Cullens and white matter signal is within normal limits throughout the brain.  Postoperative changes to the orbits. Visualized paranasal sinuses and mastoids are clear.  Visualized bone marrow signal is within normal limits.  Negative for age visualized cervical spine. Negative scalp soft tissues.  IMPRESSION: 1.Normal for age noncontrast MRI appearance of the brain. 2.  MRA findings below.  MRA HEAD WITHOUT CONTRAST  Technique: Angiographic images of the Circle of Willis were  obtained using MRA technique without  intravenous contrast.  Findings: Study intermittently affected by artifact which may have been related to scanner malfunction at the time of imaging.  This results in a ghosting or duplication type artifact on some images.  Antegrade flow in the posterior circulation.  Mildly dominant distal left vertebral artery.  Normal right PICA.  Patent vertebrobasilar junction.  No basilar stenosis.  PCA origins are within normal limits.  Posterior communicating arteries are diminutive or absent.  Bilateral PCA branches are within normal limits.  Antegrade flow in both ICA siphons.  Cavernous and proximal supraclinoid segment irregularity greater on the right.  Mild right ICA siphon stenosis.  Carotid termini remain patent.  MCA and ACA origins are within normal limits.  There appears to be a median artery of the corpus callosum.  Visualized ACA branches are within normal limits.  MCA M1 segments are patent.  Visualized bilateral MCA branches are within normal limits.  IMPRESSION: Intracranial atherosclerosis most pronounced in the ICA siphons. Mild right ICA siphon stenosis.  Otherwise negative intracranial MRA; study  intermittently affected by artifact related to scanner malfunction at the time of imaging.   Original Report Authenticated By: Erskine Speed, M.D.    Dg Abd Acute W/chest  04/17/2012  *RADIOLOGY REPORT*  Clinical Data: Low back pain.  Abdominal pain.  Vomiting and diarrhea.  ACUTE ABDOMEN SERIES (ABDOMEN 2 VIEW & CHEST 1 VIEW)  Comparison: 01/24/2011.  Findings: Left perihilar airspace disease is present, most compatible with pneumonia.  No effusion.  Cardiopericardial silhouette appears within normal limits.  Partially visualized right proximal humeral fixation hardware.  No intra-abdominal free air.  Scattered loops of mildly dilated small bowel present centrally, with thickening of the valvulae.  Findings could be associated with enteritis or partial small bowel obstruction.  Gas is present in the proximal sigmoid.  IMPRESSION: 1.  Left perihilar airspace disease most compatible with pneumonia. Follow-up to ensure clearing recommended. 2.  A few loops of mildly dilated small bowel with air-fluid levels and mural thickening can be associated with small bowel obstruction or enteritis.   Original Report Authenticated By: Andreas Newport, M.D.    Mr Mra Head/brain Wo Cm  04/17/2012  *RADIOLOGY REPORT*  Clinical Data:  71 year old female with dizziness and left side numbness.  TIA.  Headache.  Comparison: Head CT without contrast 04/17/2012 at 1329 hours.  MRI HEAD WITHOUT CONTRAST  Technique: Multiplanar, multiecho pulse sequences of the brain and surrounding structures were obtained according to standard protocol without intravenous contrast.  Findings: Degenerative ligamentous hypertrophy about the odontoid narrows the ventral CSF space at the cervicomedullary junction, but there is no cervicomedullary stenosis. No restricted diffusion to suggest acute infarction.  No midline shift, mass effect, evidence of mass lesion, ventriculomegaly, extra-axial collection or acute intracranial hemorrhage.  Pituitary is  within normal limits.  Major intracranial vascular flow voids are preserved.   Wallace Cullens and white matter signal is within normal limits throughout the brain.  Postoperative changes to the orbits. Visualized paranasal sinuses and mastoids are clear.  Visualized bone marrow signal is within normal limits.  Negative for age visualized cervical spine. Negative scalp soft tissues.  IMPRESSION: 1.Normal for age noncontrast MRI appearance of the brain. 2.  MRA findings below.  MRA HEAD WITHOUT CONTRAST  Technique: Angiographic images of the Circle of Willis were obtained using MRA technique without  intravenous contrast.  Findings: Study intermittently affected by artifact which may have been related to scanner malfunction at the time of  imaging.  This results in a ghosting or duplication type artifact on some images.  Antegrade flow in the posterior circulation.  Mildly dominant distal left vertebral artery.  Normal right PICA.  Patent vertebrobasilar junction.  No basilar stenosis.  PCA origins are within normal limits.  Posterior communicating arteries are diminutive or absent.  Bilateral PCA branches are within normal limits.  Antegrade flow in both ICA siphons.  Cavernous and proximal supraclinoid segment irregularity greater on the right.  Mild right ICA siphon stenosis.  Carotid termini remain patent.  MCA and ACA origins are within normal limits.  There appears to be a median artery of the corpus callosum.  Visualized ACA branches are within normal limits.  MCA M1 segments are patent.  Visualized bilateral MCA branches are within normal limits.  IMPRESSION: Intracranial atherosclerosis most pronounced in the ICA siphons. Mild right ICA siphon stenosis.  Otherwise negative intracranial MRA; study intermittently affected by artifact related to scanner malfunction at the time of imaging.   Original Report Authenticated By: Erskine Speed, M.D.     Medications: Scheduled Meds:    . amLODipine  10 mg Oral Daily   And    . atorvastatin  20 mg Oral q1800  . aspirin  81 mg Oral Daily  . DULoxetine  30 mg Oral BID  . famotidine  20 mg Oral BID  . ferrous sulfate  325 mg Oral TID WC  . insulin aspart  0-15 Units Subcutaneous TID WC  . insulin aspart  0-5 Units Subcutaneous QHS  . irbesartan  150 mg Oral Daily  . levofloxacin (LEVAQUIN) IV  750 mg Intravenous Q48H  . pioglitazone  30 mg Oral QPM  . prednisoLONE acetate  1 drop Right Eye BID  . sodium chloride  500 mL Intravenous Once  . sodium polystyrene  30 g Oral Once  . [DISCONTINUED] insulin aspart  0-5 Units Subcutaneous QHS  . [DISCONTINUED] insulin aspart  0-9 Units Subcutaneous TID WC  . [DISCONTINUED] levofloxacin (LEVAQUIN) IV  750 mg Intravenous Q24H  . [DISCONTINUED] piperacillin-tazobactam (ZOSYN)  IV  3.375 g Intravenous Q8H      LOS: 3 days   RAI,RIPUDEEP M.D. [facility] 04/20/2012, 1:43 PM Pager: [phone]  If 7PM-7AM, please contact night-coverage [email or web address] Password [email or web address]

## 2012-04-21 LAB — BASIC METABOLIC PANEL
Chloride: 104 mEq/L (ref 96–112)
GFR calc non Af Amer: 41 mL/min — ABNORMAL LOW (ref >90)
Glucose, Bld: 161 mg/dL — ABNORMAL HIGH (ref 70–99)
Potassium: 4.7 mEq/L (ref 3.5–5.1)
Sodium: 136 mEq/L (ref 135–145)

## 2012-04-21 LAB — GLUCOSE, CAPILLARY
Glucose-Capillary: 177 mg/dL — ABNORMAL HIGH (ref 70–99)
Glucose-Capillary: 151 mg/dL — ABNORMAL HIGH (ref 70–99)

## 2012-04-21 LAB — CBC
Hemoglobin: 9.8 g/dL — ABNORMAL LOW (ref 12.0–15.0)
MCHC: 31.9 g/dL (ref 30.0–36.0)
RBC: 3.27 MIL/uL — ABNORMAL LOW (ref 3.87–5.11)
WBC: 12.8 10*3/uL — ABNORMAL HIGH (ref 4.0–10.5)

## 2012-04-21 MED ORDER — LEVOFLOXACIN 750 MG PO TABS: 750.0000 mg | ORAL_TABLET | ORAL | Status: DC

## 2012-04-21 MED ORDER — PANTOPRAZOLE SODIUM 40 MG PO TBEC
40.0000 mg | DELAYED_RELEASE_TABLET | Freq: Every day | ORAL | Status: DC
  Administered 2012-04-21: 40 mg via ORAL
  Filled 2012-04-21: qty 1

## 2012-04-21 NOTE — Progress Notes (Signed)
OT Cancellation Note  Patient Details Name: KRISTYN LIAW MRN: [id number] DOB: 01/24/1941   Cancelled Treatment:     See OT note from 04/18/2012. Also pt being D/C to home today so feel this probably was just an inadvertent re-order due to no OT mentioned in MD note after our initial eval. Re-eval not completed.  Evette Georges [phone] 04/21/2012, 2:01 PM

## 2012-04-21 NOTE — Discharge Summary (Signed)
Physician Discharge Summary  Patient ID: Sabrina Baker MRN: [id number] DOB/AGE: 09-17-1940 71 y.o.  Admit date: 04/17/2012 Discharge date: 04/21/2012  Primary Care Physician:  Cala Bradford, MD  Discharge Diagnoses:    . Aspiration pneumonia . Gastroenteritis: Improved  . Dehydration: Resolved  . Diabetes mellitus . HTN (hypertension) . TIA (transient ischemic attack)  Consults: None   Discharge Medications:   Medication List     As of 04/21/2012  1:20 PM    STOP taking these medications         furosemide 80 MG tablet   Commonly known as: LASIX      valsartan 160 MG tablet   Commonly known as: DIOVAN      TAKE these medications         amlodipine-atorvastatin 10-20 MG per tablet   Commonly known as: CADUET   Take 1 tablet by mouth at bedtime.      aspirin EC 81 MG tablet   Take 81 mg by mouth daily.      calcium-vitamin D 500-200 MG-UNIT per tablet   Commonly known as: OSCAL WITH D   Take 1 tablet by mouth daily.      cholecalciferol 400 UNITS Tabs   Commonly known as: VITAMIN D   Take 400 Units by mouth daily.      DULoxetine 30 MG capsule   Commonly known as: CYMBALTA   Take 30 mg by mouth 2 (two) times daily.      exenatide 10 MCG/0.04ML Soln   Commonly known as: BYETTA   Inject 10 mcg into the skin 2 (two) times daily with a meal.      HYDROCODONE-ACETAMINOPHEN PO   Take 1 tablet by mouth every 6 (six) hours as needed. For pain      Iron 325 (65 FE) MG Tabs   Take 1 tablet by mouth 3 (three) times daily.      levofloxacin 750 MG tablet   Commonly known as: LEVAQUIN   Take 1 tablet (750 mg total) by mouth every other day. 5 doses, please take every other day      metFORMIN 1000 MG tablet   Commonly known as: GLUCOPHAGE   Take 1,000 mg by mouth 2 (two) times daily with a meal.      methocarbamol 500 MG tablet   Commonly known as: ROBAXIN   Take 500 mg by mouth once as needed. For muscle relaxant      pioglitazone 30 MG tablet   Commonly known as: ACTOS   Take 30 mg by mouth every evening.      prednisoLONE acetate 1 % ophthalmic suspension   Commonly known as: PRED FORTE   Place 1 drop into the right eye 2 (two) times daily.      vitamin B-12 1000 MCG tablet   Commonly known as: CYANOCOBALAMIN   Take 1,000 mcg by mouth daily.           Brief H and P: For complete details please refer to admission H and P, but in brief, 71 year old female, with known history of HTN, diabetes mellitus with neuropathy, dyslipidemia, presented with diarrhea vomiting and coughing with fever. Patient she was quite well until 04/16/12, in the evening, she ate some cooked cabbage, corn bread, and onion rings, then later, some oatmeal. She went to bed at about midnight, and developed a headache, with discomfort down the right side of her neck and followed by weakness of her right leg, which she found very  difficult to move.She denied dizziness. At about 1:00 AM on 04/17/12, she felt like going to the bathroom, but could'nt make it, and soiled her bed. Since then, she has had diarrhea, almost every hour. At some point, she started belching, coughed a a lot, then started vomiting. She has had persistent lower abdominal pain radiating to her back, and chills, but no fever.   Hospital Course:  Patient resented with vomiting and diarrhea and chest x-ray was compatible with pneumonia and left perihilar airspace disease. Most likely secondary to aspiration. She was admitted to telemetry floor. Aspiration pneumonia with leukocytosis: WBC is 26.2 at the time of her condition improved to 12.8 at discharge. blood cultures so far remained negative. Patient was initially placed on IV Zosyn and then transitioned to levofloxacin.    Gastroenteritis: Symptoms significantly improved, C. difficile negative. Patient is tolerating solids without any difficulty. CT abdomen and pelvis was done which was negative for any acute colitis. Abdominal x-ray at the time of  admission did show a few loops of mildly dilated small bowel with air-fluid levels and mural thickening with possible small bowel obstruction or enteritis.   Dehydration:resolved. Patient is recommended to hold Lasix and Avapro for 3 days.   HTN (hypertension): stable   TIA (transient ischemic attack): Symptoms right leg weakness resolved. Patient underwent full stroke workup and was placed on aspirin and statin. MRI of the brain was negative for any acute CVA. 2-D echo showed EF of 60-65%, no wall motion abnormalities. Carotid Dopplers with no ICA stenosis. Patient did very well with PT evaluation and ambulated without any difficulty     Day of Discharge BP 154/72  Pulse 87  Temp 98.7 F (37.1 C) (Oral)  Resp 15  Ht 5\' 4"  (1.626 m)  Wt 71.033 kg (156 lb 9.6 oz)  BMI 26.88 kg/m2  SpO2 96%  Physical Exam: General: Alert and awake oriented x3 not in any acute distress. HEENT: anicteric sclera, pupils reactive to light and accommodation CVS: S1-S2 clear no murmur rubs or gallops Chest: clear to auscultation bilaterally, no wheezing rales or rhonchi Abdomen: soft nontender, nondistended, normal bowel sounds, no organomegaly Extremities: no cyanosis, clubbing or edema noted bilaterally Neuro: Cranial nerves II-XII intact, no focal neurological deficits   The results of significant diagnostics from this hospitalization (including imaging, microbiology, ancillary and laboratory) are listed below for reference.    LAB RESULTS: Basic Metabolic Panel:  Lab 04/21/12 2130 04/20/12 0700  NA 136 137  K 4.7 5.4*  CL 104 104  CO2 21 22  GLUCOSE 161* 184*  BUN 17 15  CREATININE 1.30* 1.17*  CALCIUM 8.7 8.8  MG -- --  PHOS -- --   Liver Function Tests:  Lab 04/17/12 0652  AST 15  ALT 9  ALKPHOS 84  BILITOT 0.3  PROT 6.5  ALBUMIN 3.2*    Lab 04/18/12 0540 04/17/12 1240  LIPASE 31 30  AMYLASE -- --  CBC:  Lab 04/21/12 0451 04/20/12 0700 04/17/12 0652  WBC 12.8* 16.0* --    NEUTROABS -- -- 20.6*  HGB 9.8* 10.2* --  HCT 30.7* 32.0* --  MCV 93.9 -- --  PLT 352 334 --   Cardiac Enzymes: No results found for this basename: CKTOTAL:2,CKMB:2,CKMBINDEX:2,TROPONINI:2 in the last 168 hours BNP: No components found with this basename: POCBNP:2 CBG:  Lab 04/21/12 1144 04/21/12 0747  GLUCAP 151* 177*    Significant Diagnostic Studies:  Ct Head Without Contrast  04/17/2012  *RADIOLOGY REPORT*  Clinical Data: Headache.  CT HEAD WITHOUT CONTRAST  Technique:  Contiguous axial images were obtained from the base of the skull through the vertex without contrast.  Comparison: None.  Findings: Bony calvarium appears intact.  No mass effect or midline shift is noted.  Ventricular size is within normal limits.  There is no evidence of mass lesion, hemorrhage or acute infarction.  IMPRESSION: No gross intracranial abnormality seen.   Original Report Authenticated By: Lupita Raider.,  M.D.    Mri Brain Without Contrast  04/17/2012  *RADIOLOGY REPORT*  Clinical Data:  71 year old female with dizziness and left side numbness.  TIA.  Headache.  Comparison: Head CT without contrast 04/17/2012 at 1329 hours.  MRI HEAD WITHOUT CONTRAST  Technique: Multiplanar, multiecho pulse sequences of the brain and surrounding structures were obtained according to standard protocol without intravenous contrast.  Findings: Degenerative ligamentous hypertrophy about the odontoid narrows the ventral CSF space at the cervicomedullary junction, but there is no cervicomedullary stenosis. No restricted diffusion to suggest acute infarction.  No midline shift, mass effect, evidence of mass lesion, ventriculomegaly, extra-axial collection or acute intracranial hemorrhage.  Pituitary is within normal limits.  Major intracranial vascular flow voids are preserved.   Wallace Cullens and white matter signal is within normal limits throughout the brain.  Postoperative changes to the orbits. Visualized paranasal sinuses and  mastoids are clear.  Visualized bone marrow signal is within normal limits.  Negative for age visualized cervical spine. Negative scalp soft tissues.  IMPRESSION: 1.Normal for age noncontrast MRI appearance of the brain. 2.  MRA findings below.  MRA HEAD WITHOUT CONTRAST  Technique: Angiographic images of the Circle of Willis were obtained using MRA technique without  intravenous contrast.  Findings: Study intermittently affected by artifact which may have been related to scanner malfunction at the time of imaging.  This results in a ghosting or duplication type artifact on some images.  Antegrade flow in the posterior circulation.  Mildly dominant distal left vertebral artery.  Normal right PICA.  Patent vertebrobasilar junction.  No basilar stenosis.  PCA origins are within normal limits.  Posterior communicating arteries are diminutive or absent.  Bilateral PCA branches are within normal limits.  Antegrade flow in both ICA siphons.  Cavernous and proximal supraclinoid segment irregularity greater on the right.  Mild right ICA siphon stenosis.  Carotid termini remain patent.  MCA and ACA origins are within normal limits.  There appears to be a median artery of the corpus callosum.  Visualized ACA branches are within normal limits.  MCA M1 segments are patent.  Visualized bilateral MCA branches are within normal limits.  IMPRESSION: Intracranial atherosclerosis most pronounced in the ICA siphons. Mild right ICA siphon stenosis.  Otherwise negative intracranial MRA; study intermittently affected by artifact related to scanner malfunction at the time of imaging.   Original Report Authenticated By: Erskine Speed, M.D.    Dg Abd Acute W/chest  04/17/2012  *RADIOLOGY REPORT*  Clinical Data: Low back pain.  Abdominal pain.  Vomiting and diarrhea.  ACUTE ABDOMEN SERIES (ABDOMEN 2 VIEW & CHEST 1 VIEW)  Comparison: 01/24/2011.  Findings: Left perihilar airspace disease is present, most compatible with pneumonia.  No  effusion.  Cardiopericardial silhouette appears within normal limits.  Partially visualized right proximal humeral fixation hardware.  No intra-abdominal free air.  Scattered loops of mildly dilated small bowel present centrally, with thickening of the valvulae.  Findings could be associated with enteritis or partial small bowel obstruction.  Gas is present in the proximal sigmoid.  IMPRESSION: 1.  Left perihilar airspace disease most compatible with pneumonia. Follow-up to ensure clearing recommended. 2.  A few loops of mildly dilated small bowel with air-fluid levels and mural thickening can be associated with small bowel obstruction or enteritis.   Original Report Authenticated By: Andreas Newport, M.D.    Mr Mra Head/brain Wo Cm  04/17/2012  *RADIOLOGY REPORT*  Clinical Data:  71 year old female with dizziness and left side numbness.  TIA.  Headache.  Comparison: Head CT without contrast 04/17/2012 at 1329 hours.  MRI HEAD WITHOUT CONTRAST  Technique: Multiplanar, multiecho pulse sequences of the brain and surrounding structures were obtained according to standard protocol without intravenous contrast.  Findings: Degenerative ligamentous hypertrophy about the odontoid narrows the ventral CSF space at the cervicomedullary junction, but there is no cervicomedullary stenosis. No restricted diffusion to suggest acute infarction.  No midline shift, mass effect, evidence of mass lesion, ventriculomegaly, extra-axial collection or acute intracranial hemorrhage.  Pituitary is within normal limits.  Major intracranial vascular flow voids are preserved.   Wallace Cullens and white matter signal is within normal limits throughout the brain.  Postoperative changes to the orbits. Visualized paranasal sinuses and mastoids are clear.  Visualized bone marrow signal is within normal limits.  Negative for age visualized cervical spine. Negative scalp soft tissues.  IMPRESSION: 1.Normal for age noncontrast MRI appearance of the brain. 2.   MRA findings below.  MRA HEAD WITHOUT CONTRAST  Technique: Angiographic images of the Circle of Willis were obtained using MRA technique without  intravenous contrast.  Findings: Study intermittently affected by artifact which may have been related to scanner malfunction at the time of imaging.  This results in a ghosting or duplication type artifact on some images.  Antegrade flow in the posterior circulation.  Mildly dominant distal left vertebral artery.  Normal right PICA.  Patent vertebrobasilar junction.  No basilar stenosis.  PCA origins are within normal limits.  Posterior communicating arteries are diminutive or absent.  Bilateral PCA branches are within normal limits.  Antegrade flow in both ICA siphons.  Cavernous and proximal supraclinoid segment irregularity greater on the right.  Mild right ICA siphon stenosis.  Carotid termini remain patent.  MCA and ACA origins are within normal limits.  There appears to be a median artery of the corpus callosum.  Visualized ACA branches are within normal limits.  MCA M1 segments are patent.  Visualized bilateral MCA branches are within normal limits.  IMPRESSION: Intracranial atherosclerosis most pronounced in the ICA siphons. Mild right ICA siphon stenosis.  Otherwise negative intracranial MRA; study intermittently affected by artifact related to scanner malfunction at the time of imaging.   Original Report Authenticated By: Erskine Speed, M.D.      Disposition and Follow-up: Discharge Orders    Future Appointments: Provider: Department: Dept Phone: Center:   05/13/2012 8:45 AM Sherrie George, MD [facility] [phone] None     Future Orders Please Complete By Expires   Diet Carb Modified      Increase activity slowly      Discharge instructions      Comments:   1) Hold lasix and avapro for 3 days   2) recheck CBC and BMET labs on the follow-up appt with Dr Cliffton Asters.  3) Take levaquin antibiotic every other day x 5 doses, then  stop   (HEART FAILURE PATIENTS) Call MD:  Anytime you have any of the following symptoms: 1) 3 pound weight gain in 24 hours or 5  pounds in 1 week 2) shortness of breath, with or without a dry hacking cough 3) swelling in the hands, feet or stomach 4) if you have to sleep on extra pillows at night in order to breathe.          DISPOSITION: home DIET: carb modified ACTIVITY: as tolerated    DISCHARGE FOLLOW-UP Follow-up Information    Follow up with Cala Bradford, MD. Schedule an appointment as soon as possible for a visit in 10 days. (for hospital follow-up)    Contact information:   [address] [place] [address] [phone]          Time spent on Discharge: 40 mins  Signed:   Davinder Haff M.D. [facility] 04/21/2012, 1:20 PM Pager: [phone]

## 2012-04-24 LAB — CULTURE, BLOOD (ROUTINE X 2): Culture: NO GROWTH

## 2012-05-13 ENCOUNTER — Ambulatory Visit (INDEPENDENT_AMBULATORY_CARE_PROVIDER_SITE_OTHER): Payer: Medicare Other | Admitting: Ophthalmology

## 2012-05-13 DIAGNOSIS — I10 Essential (primary) hypertension: Secondary | ICD-10-CM

## 2012-05-13 DIAGNOSIS — H43819 Vitreous degeneration, unspecified eye: Secondary | ICD-10-CM

## 2012-05-13 DIAGNOSIS — H33009 Unspecified retinal detachment with retinal break, unspecified eye: Secondary | ICD-10-CM

## 2012-05-13 DIAGNOSIS — H35039 Hypertensive retinopathy, unspecified eye: Secondary | ICD-10-CM

## 2012-05-29 ENCOUNTER — Other Ambulatory Visit: Payer: Self-pay | Admitting: Family Medicine

## 2012-05-29 ENCOUNTER — Ambulatory Visit
Admission: RE | Admit: 2012-05-29 | Discharge: 2012-05-29 | Disposition: A | Discharge status: Home / Self Care | Payer: Medicare Other | Source: Ambulatory Visit | Attending: Family Medicine | Admitting: Family Medicine

## 2012-05-29 DIAGNOSIS — J189 Pneumonia, unspecified organism: Secondary | ICD-10-CM

## 2012-10-21 ENCOUNTER — Other Ambulatory Visit: Payer: Self-pay

## 2012-10-21 DIAGNOSIS — Z1231 Encounter for screening mammogram for malignant neoplasm of breast: Secondary | ICD-10-CM

## 2012-10-28 ENCOUNTER — Ambulatory Visit
Admission: RE | Admit: 2012-10-28 | Discharge: 2012-10-28 | Disposition: A | Discharge status: Home / Self Care | Payer: Medicare Other | Source: Ambulatory Visit

## 2012-10-28 DIAGNOSIS — Z1231 Encounter for screening mammogram for malignant neoplasm of breast: Secondary | ICD-10-CM

## 2012-11-11 ENCOUNTER — Ambulatory Visit (INDEPENDENT_AMBULATORY_CARE_PROVIDER_SITE_OTHER): Payer: Medicare Other | Admitting: Ophthalmology

## 2012-11-11 DIAGNOSIS — I10 Essential (primary) hypertension: Secondary | ICD-10-CM

## 2012-11-11 DIAGNOSIS — E1165 Type 2 diabetes mellitus with hyperglycemia: Secondary | ICD-10-CM

## 2012-11-11 DIAGNOSIS — H43819 Vitreous degeneration, unspecified eye: Secondary | ICD-10-CM

## 2012-11-11 DIAGNOSIS — H35039 Hypertensive retinopathy, unspecified eye: Secondary | ICD-10-CM

## 2012-11-11 DIAGNOSIS — H33009 Unspecified retinal detachment with retinal break, unspecified eye: Secondary | ICD-10-CM

## 2012-11-11 DIAGNOSIS — H33309 Unspecified retinal break, unspecified eye: Secondary | ICD-10-CM

## 2012-11-11 DIAGNOSIS — E11329 Type 2 diabetes mellitus with mild nonproliferative diabetic retinopathy without macular edema: Secondary | ICD-10-CM

## 2012-11-11 DIAGNOSIS — E1139 Type 2 diabetes mellitus with other diabetic ophthalmic complication: Secondary | ICD-10-CM

## 2012-11-18 ENCOUNTER — Ambulatory Visit (INDEPENDENT_AMBULATORY_CARE_PROVIDER_SITE_OTHER): Payer: Medicare Other | Admitting: Ophthalmology

## 2012-11-18 DIAGNOSIS — H33309 Unspecified retinal break, unspecified eye: Secondary | ICD-10-CM

## 2013-03-21 ENCOUNTER — Ambulatory Visit (INDEPENDENT_AMBULATORY_CARE_PROVIDER_SITE_OTHER): Payer: Medicare Other | Admitting: Ophthalmology

## 2013-03-21 DIAGNOSIS — H33009 Unspecified retinal detachment with retinal break, unspecified eye: Secondary | ICD-10-CM

## 2013-03-21 DIAGNOSIS — H35039 Hypertensive retinopathy, unspecified eye: Secondary | ICD-10-CM

## 2013-03-21 DIAGNOSIS — E11319 Type 2 diabetes mellitus with unspecified diabetic retinopathy without macular edema: Secondary | ICD-10-CM

## 2013-03-21 DIAGNOSIS — E1139 Type 2 diabetes mellitus with other diabetic ophthalmic complication: Secondary | ICD-10-CM

## 2013-03-21 DIAGNOSIS — H33309 Unspecified retinal break, unspecified eye: Secondary | ICD-10-CM

## 2013-03-21 DIAGNOSIS — I10 Essential (primary) hypertension: Secondary | ICD-10-CM

## 2013-03-21 DIAGNOSIS — H43819 Vitreous degeneration, unspecified eye: Secondary | ICD-10-CM

## 2013-04-02 NOTE — H&P (Signed)
Sabrina Baker is an 72 y.o. female.   Chief Complaint:Reduced vision right eye HPI: Previous retinal detachment treated with silicone oil.  Now oil needs to be removed  Right eye  Past Medical History  Diagnosis Date  . Diabetes mellitus without complication   . Hypercholesteremia   . Hypertension   . Neuromuscular disorder     neuropathy    Past Surgical History  Procedure Laterality Date  . Eye surgery      detached retna  . Ankle surgery      No family history on file. Social History:  reports that she has never smoked. She has never used smokeless tobacco. She reports that she does not drink alcohol or use illicit drugs.  Allergies: No Known Allergies  No prescriptions prior to admission    Review of systems otherwise negative  There were no vitals taken for this visit.  Physical exam: Mental status: oriented x3. Eyes: See eye exam associated with this date of surgery in media tab.  Scanned in by scanning center Ears, Nose, Throat: within normal limits Neck: Within Normal limits General: within normal limits Chest: Within normal limits Breast: deferred Heart: Within normal limits Abdomen: Within normal limits GU: deferred Extremities: within normal limits Skin: within normal limits  Assessment/Plan Retinal detachment with silicone oil Plan: To [facility] for Pars plana vitrectomy, removal of silicone oil, laser treatment,  .  Right eye   Sherrie George 04/02/2013, 7:35 AM2

## 2013-04-08 ENCOUNTER — Encounter (HOSPITAL_COMMUNITY): Payer: Self-pay | Admitting: Pharmacist

## 2013-04-14 ENCOUNTER — Other Ambulatory Visit (HOSPITAL_COMMUNITY): Payer: Self-pay | Admitting: *Deleted

## 2013-04-14 MED ORDER — GATIFLOXACIN 0.5 % OP SOLN: 1.0000 [drp] | OPHTHALMIC | Status: DC | PRN

## 2013-04-14 MED ORDER — CEFAZOLIN SODIUM-DEXTROSE 2-3 GM-% IV SOLR: 2.0000 g | INTRAVENOUS | Status: DC

## 2013-04-14 MED ORDER — PHENYLEPHRINE HCL 2.5 % OP SOLN: 1.0000 [drp] | OPHTHALMIC | Status: DC | PRN

## 2013-04-14 MED ORDER — TROPICAMIDE 1 % OP SOLN: 1.0000 [drp] | OPHTHALMIC | Status: DC | PRN

## 2013-04-14 MED ORDER — CYCLOPENTOLATE HCL 1 % OP SOLN: 1.0000 [drp] | OPHTHALMIC | Status: DC | PRN

## 2013-04-14 NOTE — Progress Notes (Signed)
Pt stated that she spoke with Misty Stanley from Dr. Ashley Royalty office making them aware that her blood pressure and blood glucose was elevated along with having a gout flare-up and that her PCP suggested that she reschedule surgery. Pt still on OR schedule at 1830; an unsuccessful attempt was made to contact Dr. Ashley Royalty.

## 2013-04-15 ENCOUNTER — Ambulatory Visit (HOSPITAL_COMMUNITY): Admission: RE | Admit: 2013-04-15 | Payer: Medicare Other | Source: Ambulatory Visit | Admitting: Ophthalmology

## 2013-04-15 ENCOUNTER — Encounter (HOSPITAL_COMMUNITY): Admission: RE | Payer: Self-pay | Source: Ambulatory Visit

## 2013-04-15 SURGERY — 25 GAUGE PARS PLANA VITRECTOMY WITH 20 GAUGE MVR PORT
Anesthesia: General | Laterality: Right

## 2013-04-21 ENCOUNTER — Inpatient Hospital Stay (INDEPENDENT_AMBULATORY_CARE_PROVIDER_SITE_OTHER): Payer: Medicare Other | Admitting: Ophthalmology

## 2013-08-19 ENCOUNTER — Other Ambulatory Visit: Payer: Self-pay

## 2013-08-19 DIAGNOSIS — Z1231 Encounter for screening mammogram for malignant neoplasm of breast: Secondary | ICD-10-CM

## 2013-09-22 ENCOUNTER — Other Ambulatory Visit: Payer: Self-pay | Admitting: Family Medicine

## 2013-09-22 DIAGNOSIS — N6009 Solitary cyst of unspecified breast: Secondary | ICD-10-CM

## 2013-10-03 ENCOUNTER — Ambulatory Visit
Admission: RE | Admit: 2013-10-03 | Discharge: 2013-10-03 | Disposition: A | Discharge status: Home / Self Care | Payer: Medicare Other | Source: Ambulatory Visit | Attending: Family Medicine | Admitting: Family Medicine

## 2013-10-03 ENCOUNTER — Encounter (INDEPENDENT_AMBULATORY_CARE_PROVIDER_SITE_OTHER): Payer: Self-pay

## 2013-10-03 DIAGNOSIS — N6009 Solitary cyst of unspecified breast: Secondary | ICD-10-CM

## 2013-11-05 ENCOUNTER — Ambulatory Visit
Admission: RE | Admit: 2013-11-05 | Discharge: 2013-11-05 | Disposition: A | Discharge status: Home / Self Care | Payer: Medicare Other | Source: Ambulatory Visit

## 2013-11-05 DIAGNOSIS — Z1231 Encounter for screening mammogram for malignant neoplasm of breast: Secondary | ICD-10-CM

## 2014-01-02 ENCOUNTER — Encounter (INDEPENDENT_AMBULATORY_CARE_PROVIDER_SITE_OTHER): Payer: Medicare Other | Admitting: Ophthalmology

## 2014-01-02 DIAGNOSIS — I10 Essential (primary) hypertension: Secondary | ICD-10-CM

## 2014-01-02 DIAGNOSIS — E1165 Type 2 diabetes mellitus with hyperglycemia: Secondary | ICD-10-CM

## 2014-01-02 DIAGNOSIS — E1139 Type 2 diabetes mellitus with other diabetic ophthalmic complication: Secondary | ICD-10-CM

## 2014-01-02 DIAGNOSIS — H35039 Hypertensive retinopathy, unspecified eye: Secondary | ICD-10-CM

## 2014-01-02 DIAGNOSIS — H43819 Vitreous degeneration, unspecified eye: Secondary | ICD-10-CM

## 2014-01-02 DIAGNOSIS — H33009 Unspecified retinal detachment with retinal break, unspecified eye: Secondary | ICD-10-CM

## 2014-01-02 DIAGNOSIS — E11319 Type 2 diabetes mellitus with unspecified diabetic retinopathy without macular edema: Secondary | ICD-10-CM

## 2014-03-24 ENCOUNTER — Encounter (INDEPENDENT_AMBULATORY_CARE_PROVIDER_SITE_OTHER): Payer: Medicare Other | Admitting: Ophthalmology

## 2014-03-24 DIAGNOSIS — H1789 Other corneal scars and opacities: Secondary | ICD-10-CM

## 2014-05-25 ENCOUNTER — Encounter (INDEPENDENT_AMBULATORY_CARE_PROVIDER_SITE_OTHER): Payer: Medicare Other | Admitting: Ophthalmology

## 2014-05-25 DIAGNOSIS — I10 Essential (primary) hypertension: Secondary | ICD-10-CM

## 2014-05-25 DIAGNOSIS — H33302 Unspecified retinal break, left eye: Secondary | ICD-10-CM

## 2014-05-25 DIAGNOSIS — H35033 Hypertensive retinopathy, bilateral: Secondary | ICD-10-CM

## 2014-05-25 DIAGNOSIS — H43813 Vitreous degeneration, bilateral: Secondary | ICD-10-CM

## 2014-05-25 DIAGNOSIS — H338 Other retinal detachments: Secondary | ICD-10-CM

## 2014-06-16 NOTE — Progress Notes (Signed)
Unable to contact pt; lvm with pre-op instructions according to pre-op checklist.  

## 2014-06-17 ENCOUNTER — Encounter (HOSPITAL_COMMUNITY): Payer: Self-pay | Admitting: *Deleted

## 2014-06-17 ENCOUNTER — Ambulatory Visit (HOSPITAL_COMMUNITY): Payer: Medicare Other

## 2014-06-17 ENCOUNTER — Ambulatory Visit (HOSPITAL_COMMUNITY): Payer: Medicare Other | Admitting: Anesthesiology

## 2014-06-17 ENCOUNTER — Ambulatory Visit (HOSPITAL_COMMUNITY)
Admission: RE | Admit: 2014-06-17 | Discharge: 2014-06-17 | Disposition: A | Discharge status: Home / Self Care | Payer: Medicare Other | Source: Ambulatory Visit | Attending: Orthopaedic Surgery | Admitting: Orthopaedic Surgery

## 2014-06-17 ENCOUNTER — Encounter (HOSPITAL_COMMUNITY)
Admission: RE | Disposition: A | Discharge status: Home / Self Care | Payer: Self-pay | Source: Ambulatory Visit | Attending: Orthopaedic Surgery

## 2014-06-17 ENCOUNTER — Other Ambulatory Visit (HOSPITAL_COMMUNITY): Payer: Self-pay | Admitting: Orthopaedic Surgery

## 2014-06-17 DIAGNOSIS — G629 Polyneuropathy, unspecified: Secondary | ICD-10-CM | POA: Diagnosis not present

## 2014-06-17 DIAGNOSIS — E119 Type 2 diabetes mellitus without complications: Secondary | ICD-10-CM | POA: Insufficient documentation

## 2014-06-17 DIAGNOSIS — X58XXXA Exposure to other specified factors, initial encounter: Secondary | ICD-10-CM | POA: Insufficient documentation

## 2014-06-17 DIAGNOSIS — E780 Pure hypercholesterolemia: Secondary | ICD-10-CM | POA: Diagnosis not present

## 2014-06-17 DIAGNOSIS — Z7982 Long term (current) use of aspirin: Secondary | ICD-10-CM | POA: Insufficient documentation

## 2014-06-17 DIAGNOSIS — Y929 Unspecified place or not applicable: Secondary | ICD-10-CM | POA: Diagnosis not present

## 2014-06-17 DIAGNOSIS — S82842A Displaced bimalleolar fracture of left lower leg, initial encounter for closed fracture: Secondary | ICD-10-CM | POA: Diagnosis present

## 2014-06-17 DIAGNOSIS — Y939 Activity, unspecified: Secondary | ICD-10-CM | POA: Insufficient documentation

## 2014-06-17 DIAGNOSIS — Z419 Encounter for procedure for purposes other than remedying health state, unspecified: Secondary | ICD-10-CM

## 2014-06-17 DIAGNOSIS — I10 Essential (primary) hypertension: Secondary | ICD-10-CM | POA: Diagnosis not present

## 2014-06-17 HISTORY — PX: ORIF ANKLE FRACTURE: SHX5408

## 2014-06-17 LAB — BASIC METABOLIC PANEL
Anion gap: 8 (ref 5–15)
BUN: 18 mg/dL (ref 6–23)
CALCIUM: 9 mg/dL (ref 8.4–10.5)
CO2: 31 mmol/L (ref 19–32)
Chloride: 102 mEq/L (ref 96–112)
Creatinine, Ser: 1.35 mg/dL — ABNORMAL HIGH (ref 0.50–1.10)
GFR calc Af Amer: 44 mL/min — ABNORMAL LOW (ref >90)
GFR calc non Af Amer: 38 mL/min — ABNORMAL LOW (ref >90)
Glucose, Bld: 112 mg/dL — ABNORMAL HIGH (ref 70–99)
Potassium: 5.1 mmol/L (ref 3.5–5.1)
Sodium: 141 mmol/L (ref 135–145)

## 2014-06-17 LAB — CBC
HEMATOCRIT: 34.5 % — AB (ref 36.0–46.0)
Hemoglobin: 10.4 g/dL — ABNORMAL LOW (ref 12.0–15.0)
MCH: 28 pg (ref 26.0–34.0)
MCHC: 30.1 g/dL (ref 30.0–36.0)
MCV: 92.7 fL (ref 78.0–100.0)
Platelets: 515 10*3/uL — ABNORMAL HIGH (ref 150–400)
RBC: 3.72 MIL/uL — ABNORMAL LOW (ref 3.87–5.11)
RDW: 14.6 % (ref 11.5–15.5)
WBC: 9.3 10*3/uL (ref 4.0–10.5)

## 2014-06-17 LAB — GLUCOSE, CAPILLARY
Glucose-Capillary: 96 mg/dL (ref 70–99)
Glucose-Capillary: 73 mg/dL (ref 70–99)

## 2014-06-17 LAB — SURGICAL PCR SCREEN
MRSA, PCR: NEGATIVE
Staphylococcus aureus: NEGATIVE

## 2014-06-17 SURGERY — OPEN REDUCTION INTERNAL FIXATION (ORIF) ANKLE FRACTURE
Anesthesia: Regional | Site: Ankle | Laterality: Left

## 2014-06-17 MED ORDER — ONDANSETRON HCL 4 MG/2ML IJ SOLN
INTRAMUSCULAR | Status: DC | PRN
  Administered 2014-06-17: 4 mg via INTRAVENOUS

## 2014-06-17 MED ORDER — 0.9 % SODIUM CHLORIDE (POUR BTL) OPTIME
TOPICAL | Status: DC | PRN
  Administered 2014-06-17: 1000 mL

## 2014-06-17 MED ORDER — GLYCOPYRROLATE 0.2 MG/ML IJ SOLN
INTRAMUSCULAR | Status: DC | PRN
  Administered 2014-06-17: 0.2 mg via INTRAVENOUS

## 2014-06-17 MED ORDER — HYDROCODONE-ACETAMINOPHEN 5-325 MG PO TABS: 1.0000 | ORAL_TABLET | Freq: Four times a day (QID) | ORAL | Status: DC | PRN

## 2014-06-17 MED ORDER — HYDROMORPHONE HCL 1 MG/ML IJ SOLN
0.2500 mg | INTRAMUSCULAR | Status: DC | PRN
  Administered 2014-06-17: 0.5 mg via INTRAVENOUS

## 2014-06-17 MED ORDER — CEFAZOLIN SODIUM-DEXTROSE 2-3 GM-% IV SOLR: 2.0000 g | INTRAVENOUS | Status: DC

## 2014-06-17 MED ORDER — FENTANYL CITRATE 0.05 MG/ML IJ SOLN
INTRAMUSCULAR | Status: DC | PRN
  Administered 2014-06-17 (×2): 25 ug via INTRAVENOUS

## 2014-06-17 MED ORDER — ROPIVACAINE HCL 5 MG/ML IJ SOLN
INTRAMUSCULAR | Status: DC | PRN
  Administered 2014-06-17: 40 mL via PERINEURAL

## 2014-06-17 MED ORDER — METHOCARBAMOL 500 MG PO TABS
ORAL_TABLET | ORAL | Status: DC
Start: 2014-06-17 — End: 2014-06-18
  Filled 2014-06-17: qty 2

## 2014-06-17 MED ORDER — LACTATED RINGERS IV SOLN: INTRAVENOUS | Status: DC

## 2014-06-17 MED ORDER — LACTATED RINGERS IV SOLN
INTRAVENOUS | Status: DC | PRN
  Administered 2014-06-17: 13:00:00 via INTRAVENOUS

## 2014-06-17 MED ORDER — ONDANSETRON HCL 4 MG/2ML IJ SOLN
INTRAMUSCULAR | Status: AC
  Filled 2014-06-17: qty 2

## 2014-06-17 MED ORDER — HYDROCODONE-ACETAMINOPHEN 5-325 MG PO TABS
ORAL_TABLET | ORAL | Status: AC
  Filled 2014-06-17: qty 2

## 2014-06-17 MED ORDER — METHOCARBAMOL 750 MG PO TABS: 750.0000 mg | ORAL_TABLET | Freq: Two times a day (BID) | ORAL | Status: DC | PRN

## 2014-06-17 MED ORDER — METHOCARBAMOL 500 MG PO TABS
750.0000 mg | ORAL_TABLET | Freq: Once | ORAL | Status: AC | PRN
  Administered 2014-06-17: 750 mg via ORAL

## 2014-06-17 MED ORDER — MIDAZOLAM HCL 2 MG/2ML IJ SOLN: 1.0000 mg | INTRAMUSCULAR | Status: DC | PRN

## 2014-06-17 MED ORDER — CEFAZOLIN SODIUM-DEXTROSE 2-3 GM-% IV SOLR
INTRAVENOUS | Status: AC
Start: 2014-06-17 — End: 2014-06-17
  Administered 2014-06-17: 2 g via INTRAVENOUS
  Filled 2014-06-17: qty 50

## 2014-06-17 MED ORDER — MUPIROCIN 2 % EX OINT
TOPICAL_OINTMENT | Freq: Once | CUTANEOUS | Status: AC
  Administered 2014-06-17: 1 via NASAL
  Filled 2014-06-17: qty 22

## 2014-06-17 MED ORDER — DEXAMETHASONE SODIUM PHOSPHATE 4 MG/ML IJ SOLN
INTRAMUSCULAR | Status: AC
  Filled 2014-06-17: qty 2

## 2014-06-17 MED ORDER — EPHEDRINE SULFATE 50 MG/ML IJ SOLN
INTRAMUSCULAR | Status: DC | PRN
  Administered 2014-06-17 (×2): 5 mg via INTRAVENOUS

## 2014-06-17 MED ORDER — LIDOCAINE HCL (CARDIAC) 20 MG/ML IV SOLN
INTRAVENOUS | Status: DC | PRN
  Administered 2014-06-17: 50 mg via INTRAVENOUS

## 2014-06-17 MED ORDER — ARTIFICIAL TEARS OP OINT
TOPICAL_OINTMENT | OPHTHALMIC | Status: AC
  Filled 2014-06-17: qty 3.5

## 2014-06-17 MED ORDER — FENTANYL CITRATE 0.05 MG/ML IJ SOLN
INTRAMUSCULAR | Status: AC
  Filled 2014-06-17: qty 5

## 2014-06-17 MED ORDER — PROPOFOL 10 MG/ML IV BOLUS
INTRAVENOUS | Status: AC
  Filled 2014-06-17: qty 20

## 2014-06-17 MED ORDER — HYDROCODONE-ACETAMINOPHEN 5-325 MG PO TABS
1.0000 | ORAL_TABLET | Freq: Once | ORAL | Status: AC | PRN
  Administered 2014-06-17: 2 via ORAL

## 2014-06-17 MED ORDER — FENTANYL CITRATE 0.05 MG/ML IJ SOLN
INTRAMUSCULAR | Status: AC
  Filled 2014-06-17: qty 2

## 2014-06-17 MED ORDER — ROCURONIUM BROMIDE 50 MG/5ML IV SOLN
INTRAVENOUS | Status: AC
  Filled 2014-06-17: qty 1

## 2014-06-17 MED ORDER — FENTANYL CITRATE 0.05 MG/ML IJ SOLN
50.0000 ug | INTRAMUSCULAR | Status: DC | PRN
  Administered 2014-06-17: 50 ug via INTRAVENOUS

## 2014-06-17 MED ORDER — PROPOFOL 10 MG/ML IV BOLUS
INTRAVENOUS | Status: DC | PRN
  Administered 2014-06-17: 200 mg via INTRAVENOUS

## 2014-06-17 MED ORDER — HYDROMORPHONE HCL 1 MG/ML IJ SOLN
INTRAMUSCULAR | Status: AC
  Filled 2014-06-17: qty 1

## 2014-06-17 MED ORDER — GLYCOPYRROLATE 0.2 MG/ML IJ SOLN
INTRAMUSCULAR | Status: AC
  Filled 2014-06-17: qty 2

## 2014-06-17 MED ORDER — CARVEDILOL 25 MG PO TABS
25.0000 mg | ORAL_TABLET | Freq: Once | ORAL | Status: AC
  Administered 2014-06-17: 25 mg via ORAL
  Filled 2014-06-17: qty 1

## 2014-06-17 MED ORDER — MUPIROCIN 2 % EX OINT
TOPICAL_OINTMENT | CUTANEOUS | Status: AC
  Filled 2014-06-17: qty 22

## 2014-06-17 SURGICAL SUPPLY — 67 items
BANDAGE ELASTIC 4 VELCRO ST LF (GAUZE/BANDAGES/DRESSINGS) IMPLANT
BANDAGE ELASTIC 6 VELCRO ST LF (GAUZE/BANDAGES/DRESSINGS) ×1 IMPLANT
BIT DRILL 2.5X125 (BIT) ×1 IMPLANT
BNDG COHESIVE 4X5 TAN STRL (GAUZE/BANDAGES/DRESSINGS) ×2 IMPLANT
BNDG COHESIVE 6X5 TAN STRL LF (GAUZE/BANDAGES/DRESSINGS) ×2 IMPLANT
COVER SURGICAL LIGHT HANDLE (MISCELLANEOUS) ×2 IMPLANT
CUFF TOURNIQUET SINGLE 24IN (TOURNIQUET CUFF) ×1 IMPLANT
CUFF TOURNIQUET SINGLE 34IN LL (TOURNIQUET CUFF) IMPLANT
CUFF TOURNIQUET SINGLE 44IN (TOURNIQUET CUFF) IMPLANT
DRAPE C-ARM 42X72 X-RAY (DRAPES) ×2 IMPLANT
DRAPE C-ARMOR (DRAPES) ×2 IMPLANT
DRAPE IMP U-DRAPE 54X76 (DRAPES) ×2 IMPLANT
DRAPE INCISE IOBAN 66X45 STRL (DRAPES) ×2 IMPLANT
DRAPE U-SHAPE 47X51 STRL (DRAPES) ×4 IMPLANT
DRILL 2.6X122MM WL AO SHAFT (BIT) ×1 IMPLANT
DURAPREP 26ML APPLICATOR (WOUND CARE) ×2 IMPLANT
ELECT CAUTERY BLADE 6.4 (BLADE) ×2 IMPLANT
ELECT REM PT RETURN 9FT ADLT (ELECTROSURGICAL) ×2
ELECTRODE REM PT RTRN 9FT ADLT (ELECTROSURGICAL) ×1 IMPLANT
FACESHIELD WRAPAROUND (MASK) ×2 IMPLANT
FACESHIELD WRAPAROUND OR TEAM (MASK) ×1 IMPLANT
GAUZE SPONGE 4X4 12PLY STRL (GAUZE/BANDAGES/DRESSINGS) ×1 IMPLANT
GAUZE XEROFORM 5X9 LF (GAUZE/BANDAGES/DRESSINGS) ×2 IMPLANT
GLOVE NEODERM STRL 7.5 LF PF (GLOVE) ×2 IMPLANT
GLOVE SURG NEODERM 7.5  LF PF (GLOVE) ×2
GLOVE SURG SYN 7.5  E (GLOVE) ×1
GLOVE SURG SYN 7.5 E (GLOVE) ×1 IMPLANT
GLOVE SURG SYN 7.5 PF PI (GLOVE) IMPLANT
GOWN STRL REIN XL XLG (GOWN DISPOSABLE) ×4 IMPLANT
K-WIRE ORTHOPEDIC 1.4X150L (WIRE) ×4
KIT BASIN OR (CUSTOM PROCEDURE TRAY) ×2 IMPLANT
KIT ROOM TURNOVER OR (KITS) ×2 IMPLANT
KWIRE ORTHOPEDIC 1.4X150L (WIRE) IMPLANT
NDL HYPO 25GX1X1/2 BEV (NEEDLE) IMPLANT
NEEDLE HYPO 25GX1X1/2 BEV (NEEDLE) IMPLANT
NS IRRIG 1000ML POUR BTL (IV SOLUTION) ×2 IMPLANT
PACK ORTHO EXTREMITY (CUSTOM PROCEDURE TRAY) ×2 IMPLANT
PAD ARMBOARD 7.5X6 YLW CONV (MISCELLANEOUS) ×4 IMPLANT
PAD CAST 3X4 CTTN HI CHSV (CAST SUPPLIES) ×2 IMPLANT
PAD CAST 4YDX4 CTTN HI CHSV (CAST SUPPLIES) IMPLANT
PADDING CAST COTTON 3X4 STRL (CAST SUPPLIES)
PADDING CAST COTTON 4X4 STRL (CAST SUPPLIES) ×2
PADDING CAST COTTON 6X4 STRL (CAST SUPPLIES) ×2 IMPLANT
PLATE FIBULA 4H (Plate) ×1 IMPLANT
SCREW 3.5X10MM (Screw) ×2 IMPLANT
SCREW BONE 14MMX3.5MM (Screw) ×1 IMPLANT
SCREW BONE 3.5X16MM (Screw) ×1 IMPLANT
SCREW BONE NON-LCKING 3.5X12MM (Screw) ×2 IMPLANT
SCREW CANNULATED 4.0X38MM (Screw) ×1 IMPLANT
SCREW CANNULATED 4.0X70MM (Screw) ×1 IMPLANT
SCREW LOCK 3.5X14 (Screw) ×2 IMPLANT
SCREW LOCKING 3.5X18MM (Screw) ×1 IMPLANT
SPLINT FIBERGLASS 4X30 (CAST SUPPLIES) ×1 IMPLANT
SPONGE GAUZE 4X4 12PLY STER LF (GAUZE/BANDAGES/DRESSINGS) ×1 IMPLANT
SPONGE LAP 18X18 X RAY DECT (DISPOSABLE) ×4 IMPLANT
SUCTION FRAZIER TIP 10 FR DISP (SUCTIONS) ×2 IMPLANT
SUT ETHILON 2 0 FS 18 (SUTURE) IMPLANT
SUT ETHILON 3 0 PS 1 (SUTURE) ×2 IMPLANT
SUT VIC AB 0 CT1 27 (SUTURE) ×2
SUT VIC AB 0 CT1 27XBRD ANBCTR (SUTURE) IMPLANT
SUT VIC AB 2-0 CT1 27 (SUTURE) ×2
SUT VIC AB 2-0 CT1 TAPERPNT 27 (SUTURE) IMPLANT
SYR CONTROL 10ML LL (SYRINGE) IMPLANT
TOWEL OR 17X24 6PK STRL BLUE (TOWEL DISPOSABLE) ×2 IMPLANT
TOWEL OR 17X26 10 PK STRL BLUE (TOWEL DISPOSABLE) ×4 IMPLANT
TUBE CONNECTING 12X1/4 (SUCTIONS) ×2 IMPLANT
WATER STERILE IRR 1000ML POUR (IV SOLUTION) ×2 IMPLANT

## 2014-06-17 NOTE — Anesthesia Preprocedure Evaluation (Addendum)
Anesthesia Evaluation  Patient identified by MRN, date of birth, ID band Patient awake    Reviewed: Allergy & Precautions, H&P , NPO status , Patient's Chart, lab work & pertinent test results, reviewed documented beta blocker date and time   Airway Mallampati: I  TM Distance: >3 FB Neck ROM: Full    Dental no notable dental hx. (+) Edentulous Upper, Edentulous Lower, Dental Advisory Given   Pulmonary neg pulmonary ROS,  breath sounds clear to auscultation  Pulmonary exam normal       Cardiovascular hypertension, Pt. on medications and Pt. on home beta blockers Rhythm:Regular Rate:Normal     Neuro/Psych TIAnegative psych ROS   GI/Hepatic negative GI ROS, Neg liver ROS,   Endo/Other  diabetes, Type 2, Oral Hypoglycemic Agents  Renal/GU negative Renal ROS  negative genitourinary   Musculoskeletal   Abdominal   Peds  Hematology negative hematology ROS (+)   Anesthesia Other Findings   Reproductive/Obstetrics negative OB ROS                            Anesthesia Physical Anesthesia Plan  ASA: II  Anesthesia Plan: General and Regional   Post-op Pain Management:    Induction: Intravenous  Airway Management Planned: LMA  Additional Equipment:   Intra-op Plan:   Post-operative Plan: Extubation in OR  Informed Consent: I have reviewed the patients History and Physical, chart, labs and discussed the procedure including the risks, benefits and alternatives for the proposed anesthesia with the patient or authorized representative who has indicated his/her understanding and acceptance.   Dental advisory given  Plan Discussed with: CRNA  Anesthesia Plan Comments:         Anesthesia Quick Evaluation

## 2014-06-17 NOTE — Transfer of Care (Signed)
Immediate Anesthesia Transfer of Care Note  Patient: Sabrina Baker  Procedure(s) Performed: Procedure(s): OPEN REDUCTION INTERNAL FIXATION (ORIF) LEFT BIMALLEOLAR ANKLE FRACTURE (Left)  Patient Location: PACU  Anesthesia Type:General  Level of Consciousness: awake, alert  and oriented  Airway & Oxygen Therapy: Patient Spontanous Breathing and Patient connected to nasal cannula oxygen  Post-op Assessment: Report given to PACU RN  Post vital signs: Reviewed and stable  Complications: No apparent anesthesia complications

## 2014-06-17 NOTE — H&P (Signed)
PREOPERATIVE H&P  Chief Complaint: left bimalleolar ankle fracture  HPI: Sabrina Baker is a 74 y.o. female who presents for surgical treatment of left bimalleolar ankle fracture.  She denies any changes in medical history.  Past Medical History  Diagnosis Date  . Diabetes mellitus without complication   . Hypercholesteremia   . Hypertension   . Neuromuscular disorder     neuropathy   Past Surgical History  Procedure Laterality Date  . Eye surgery      detached retna  . Ankle surgery     History   Social History  . Marital Status: Divorced    Spouse Name: N/A    Number of Children: N/A  . Years of Education: N/A   Social History Main Topics  . Smoking status: Never Smoker   . Smokeless tobacco: Never Used  . Alcohol Use: No  . Drug Use: No  . Sexual Activity: No   Other Topics Concern  . Not on file   Social History Narrative  . No narrative on file   No family history on file. Allergies  Allergen Reactions  . Altace [Ramipril] Anaphylaxis  . Diovan [Valsartan] Other (See Comments)    Hyperkalemia   . Niacin And Related Diarrhea   Prior to Admission medications   Medication Sig Start Date End Date Taking? Authorizing Provider  acetaminophen (TYLENOL) 500 MG tablet Take 500 mg by mouth 2 (two) times daily. Take 1 to 2 times a day    Historical Provider, MD  amLODipine (NORVASC) 10 MG tablet Take 10 mg by mouth daily.    Historical Provider, MD  aspirin 325 MG EC tablet Take 325 mg by mouth daily.    Historical Provider, MD  atorvastatin (LIPITOR) 10 MG tablet Take 10 mg by mouth daily.    Historical Provider, MD  bacitracin ophthalmic ointment  03/30/14   Historical Provider, MD  brimonidine (ALPHAGAN) 0.15 % ophthalmic solution  04/15/14   Historical Provider, MD  calcium-vitamin D (OSCAL WITH D) 500-200 MG-UNIT per tablet Take 1 tablet by mouth 2 (two) times daily.     Historical Provider, MD  carvedilol (COREG) 25 MG tablet  05/04/14   Historical Provider,  MD  carvedilol (COREG) 3.125 MG tablet Take 3.125 mg by mouth 2 (two) times daily with a meal.    Historical Provider, MD  colchicine 0.6 MG tablet Take 0.6 mg by mouth daily. Started 04/07/13    Historical Provider, MD  doxycycline (VIBRA-TABS) 100 MG tablet Take 100 mg by mouth 2 (two) times daily. 03/30/14   Historical Provider, MD  DULoxetine (CYMBALTA) 30 MG capsule Take 30 mg by mouth 2 (two) times daily.    Historical Provider, MD  exenatide (BYETTA) 10 MCG/0.04ML SOLN Inject 10 mcg into the skin 2 (two) times daily with a meal.    Historical Provider, MD  Ferrous Sulfate (IRON) 325 (65 FE) MG TABS Take 1 tablet by mouth 3 (three) times daily.    Historical Provider, MD  furosemide (LASIX) 20 MG tablet Take 20 mg by mouth.    Historical Provider, MD  furosemide (LASIX) 40 MG tablet  05/04/14   Historical Provider, MD  HYDROcodone-acetaminophen (NORCO/VICODIN) 5-325 MG per tablet Take by mouth. for pain 06/02/14   Historical Provider, MD  metFORMIN (GLUCOPHAGE) 1000 MG tablet Take 1,000 mg by mouth 2 (two) times daily with a meal.    Historical Provider, MD  pantoprazole (PROTONIX) 40 MG tablet Take 40 mg by mouth 2 (two) times daily.  Historical Provider, MD  pioglitazone (ACTOS) 30 MG tablet Take 30 mg by mouth every evening.    Historical Provider, MD  prednisoLONE acetate (PRED FORTE) 1 % ophthalmic suspension Place 1 drop into the right eye 2 (two) times daily.    Historical Provider, MD  timolol (TIMOPTIC) 0.5 % ophthalmic solution  05/21/14   Historical Provider, MD  tobramycin (TOBREX) 0.3 % ophthalmic solution  03/24/14   Historical Provider, MD  TOBREX 0.3 % ophthalmic ointment  03/27/14   Historical Provider, MD  trolamine salicylate (ASPERCREME) 10 % cream Apply topically as needed.    Historical Provider, MD  TRULICITY 1.5 SU/1.1SR SOPN  05/28/14   Historical Provider, MD  VIGAMOX 0.5 % ophthalmic solution  04/04/14   Historical Provider, MD  vitamin B-12 (CYANOCOBALAMIN)  1000 MCG tablet Take 1,000 mcg by mouth daily.    Historical Provider, MD     Positive ROS: All other systems have been reviewed and were otherwise negative with the exception of those mentioned in the HPI and as above.  Physical Exam: General: Alert, no acute distress Cardiovascular: No pedal edema Respiratory: No cyanosis, no use of accessory musculature GI: abdomen soft Skin: No lesions in the area of chief complaint Neurologic: Sensation intact distally Psychiatric: Patient is competent for consent with normal mood and affect Lymphatic: no lymphedema  MUSCULOSKELETAL: exam stable  Assessment: left bimalleolar ankle fracture  Plan: Plan for Procedure(s): OPEN REDUCTION INTERNAL FIXATION (ORIF) LEFT BIMALLEOLAR ANKLE FRACTURE  The risks benefits and alternatives were discussed with the patient including but not limited to the risks of nonoperative treatment, versus surgical intervention including infection, bleeding, nerve injury,  blood clots, cardiopulmonary complications, morbidity, mortality, among others, and they were willing to proceed.   Marianna Payment, MD   06/17/2014 8:06 AM

## 2014-06-17 NOTE — Anesthesia Postprocedure Evaluation (Signed)
  Anesthesia Post-op Note  Patient: Sabrina Baker  Procedure(s) Performed: Procedure(s): OPEN REDUCTION INTERNAL FIXATION (ORIF) LEFT BIMALLEOLAR ANKLE FRACTURE (Left)  Patient Location: PACU  Anesthesia Type:General and block  Level of Consciousness: awake and alert   Airway and Oxygen Therapy: Patient Spontanous Breathing  Post-op Pain: mild  Post-op Assessment: Post-op Vital signs reviewed, Patient's Cardiovascular Status Stable and Respiratory Function Stable  Post-op Vital Signs: Reviewed  Filed Vitals:   06/17/14 1700  BP: 161/68  Pulse: 73  Temp:   Resp: 16    Complications: No apparent anesthesia complications

## 2014-06-17 NOTE — Anesthesia Procedure Notes (Signed)
Anesthesia Regional Block:  Popliteal block  Pre-Anesthetic Checklist: ,, timeout performed, Correct Patient, Correct Site, Correct Laterality, Correct Procedure, Correct Position, site marked, Risks and benefits discussed, pre-op evaluation, post-op pain management  Laterality: Left  Prep: Maximum Sterile Barrier Precautions used and chloraprep       Needles:  Injection technique: Single-shot  Needle Type: Echogenic Stimulator Needle     Needle Length: 9cm 9 cm Needle Gauge: 21 and 21 G    Additional Needles:  Procedures: ultrasound guided (picture in chart) and nerve stimulator Popliteal block  Nerve Stimulator or Paresthesia:  Response: Peroneal,  Response: Tibial,   Additional Responses:   Narrative:  Start time: 06/17/2014 1:00 PM End time: 06/17/2014 1:13 PM Injection made incrementally with aspirations every 5 mL.  Performed by: Personally  Anesthesiologist: Roderic Palau E  Additional Notes: 2% Lidocaine skin wheel. Saphenous block with 10cc of 0.5% Ropivicaine plain.

## 2014-06-17 NOTE — Op Note (Signed)
Date of Surgery: 06/17/2014  INDICATIONS: Sabrina Baker is a 74 y.o.-year-old female who sustained a left ankle fracture; she was indicated for open reduction and internal fixation due to worsening displacement of her fracture and failure of closed treatment and came to the operating room today for this procedure. The patient did consent to the procedure after discussion of the risks (including infection, osteomyelitis, wound complications and need for eventual amputation) and benefits.  PREOPERATIVE DIAGNOSIS: left bimalleolar ankle fracture  POSTOPERATIVE DIAGNOSIS: Same.  PROCEDURE: Open treatment of left ankle fracture with internal fixation. Bimalleolar CPT [id number]  SURGEON: N. Eduard Roux, M.D.  ASSIST: none.  ANESTHESIA:  general  IV FLUIDS AND URINE: See anesthesia.  ESTIMATED BLOOD LOSS: minimal mL.  IMPLANTS: [employer] Variax 4 hole distal fibula plate, 4.0 mm cannulated screws x 2  COMPLICATIONS: None.  DESCRIPTION OF PROCEDURE: The patient was brought to the operating room and placed supine on the operating table.  The patient had been signed prior to the procedure and this was documented. The patient had the anesthesia placed by the anesthesiologist.  A nonsterile tourniquet was placed on the upper thigh.  The prep verification and incision time-outs were performed to confirm that this was the correct patient, site, side and location. The patient had an SCD on the opposite lower extremity. The patient did receive antibiotics prior to the incision and was re-dosed during the procedure as needed at indicated intervals.  The patient had the lower extremity prepped and draped in the standard surgical fashion.  The extremity was exsanguinated using an esmarch bandage and the tourniquet was inflated to 300 mm Hg.  A lateral incision over the fibula was used. Full-thickness flaps were created and raised off of the fibula. Care was taken not to manipulate the skin more than necessary. Immature  callus and organized hematoma was retrieved from the fracture site. There was an oblique fracture that was still mobile. Of note her bone quality was extremely poor. I was able to achieve adequate reduction using traction and rotation. The fracture was provisionally pinned with a K wire. I was not able to use a bone clamp given the poor quality of the bone. With the fracture provisionally fixed I placed a 4 hole precontoured plate on the lateral aspect of the fibula. I then placed a series of locking and nonlocking screws through the plate and into the fibula. Fluoroscopy was used to confirm adequate placement of the hardware.  I then turned my attention to fixation of the medial malleolus. A curvilinear incision based over the medial malleolus was used. Full-thickness flaps were again created and raised off of the medial now. A dental pick was used to hold the fracture provisionally. Under fluoroscopy 2 parallel K wires were advanced up the medial malleolus. I then placed 2 partially threaded 4.0 mm screws in order to compress across the fracture site. Given the poor quality of her bone I did place a long partially threaded screw to gain purchase on the far cortex. Final x-rays were taken. The wounds were thoroughly irrigated. The wounds were closed in layer fashion using 0 Vicryl for the fascia, 2-0 Vicryl for the deep skin layer, 3-0 nylon for the skin. Sterile dressings were applied. Extremity was immobilized in a short-leg splint. Patient was extubated and transferred to the PACU in stable condition.  POSTOPERATIVE PLAN: Sabrina Baker will remain nonweightbearing on this leg for approximately 6 weeks; Sabrina Baker will return for suture removal in 2 weeks.  He will  be immobilized in a short leg splint and then transitioned to a short leg cast at his first follow up appointment.  Sabrina Baker will receive DVT prophylaxis based on other medications, activity level, and risk ratio of bleeding to thrombosis.  Sabrina Cecil, MD [place] 3:45 PM

## 2014-06-17 NOTE — Discharge Instructions (Signed)
1. Do not put any weight or pressure on the left foot 2. Keep splint clean and dry 3. Need to keep blood sugars under tight control to minimize risk of infection and wound complications 4. Needs to work with PT for mobilization.

## 2014-06-22 ENCOUNTER — Encounter (HOSPITAL_COMMUNITY): Payer: Self-pay | Admitting: Orthopaedic Surgery

## 2014-07-06 ENCOUNTER — Ambulatory Visit (INDEPENDENT_AMBULATORY_CARE_PROVIDER_SITE_OTHER): Payer: Medicare Other | Admitting: Ophthalmology

## 2014-07-27 ENCOUNTER — Encounter (INDEPENDENT_AMBULATORY_CARE_PROVIDER_SITE_OTHER): Payer: Medicare Other | Admitting: Ophthalmology

## 2014-08-06 ENCOUNTER — Encounter (INDEPENDENT_AMBULATORY_CARE_PROVIDER_SITE_OTHER): Payer: Medicare Other | Admitting: Ophthalmology

## 2014-08-06 DIAGNOSIS — E11319 Type 2 diabetes mellitus with unspecified diabetic retinopathy without macular edema: Secondary | ICD-10-CM

## 2014-08-06 DIAGNOSIS — H338 Other retinal detachments: Secondary | ICD-10-CM

## 2014-08-06 DIAGNOSIS — E11329 Type 2 diabetes mellitus with mild nonproliferative diabetic retinopathy without macular edema: Secondary | ICD-10-CM

## 2014-08-06 DIAGNOSIS — H35033 Hypertensive retinopathy, bilateral: Secondary | ICD-10-CM

## 2014-08-06 DIAGNOSIS — H43813 Vitreous degeneration, bilateral: Secondary | ICD-10-CM

## 2014-10-13 ENCOUNTER — Encounter (INDEPENDENT_AMBULATORY_CARE_PROVIDER_SITE_OTHER): Payer: Medicare Other | Admitting: Ophthalmology

## 2014-10-13 DIAGNOSIS — H35372 Puckering of macula, left eye: Secondary | ICD-10-CM | POA: Diagnosis not present

## 2014-10-13 DIAGNOSIS — I10 Essential (primary) hypertension: Secondary | ICD-10-CM

## 2014-10-13 DIAGNOSIS — H35033 Hypertensive retinopathy, bilateral: Secondary | ICD-10-CM

## 2014-10-13 DIAGNOSIS — E11311 Type 2 diabetes mellitus with unspecified diabetic retinopathy with macular edema: Secondary | ICD-10-CM

## 2014-10-13 DIAGNOSIS — H338 Other retinal detachments: Secondary | ICD-10-CM | POA: Diagnosis not present

## 2014-10-13 DIAGNOSIS — E11329 Type 2 diabetes mellitus with mild nonproliferative diabetic retinopathy without macular edema: Secondary | ICD-10-CM | POA: Diagnosis not present

## 2014-10-13 DIAGNOSIS — H43813 Vitreous degeneration, bilateral: Secondary | ICD-10-CM | POA: Diagnosis not present

## 2014-11-23 ENCOUNTER — Other Ambulatory Visit: Payer: Self-pay | Admitting: Family Medicine

## 2014-11-23 DIAGNOSIS — M810 Age-related osteoporosis without current pathological fracture: Secondary | ICD-10-CM

## 2014-11-23 DIAGNOSIS — Z1231 Encounter for screening mammogram for malignant neoplasm of breast: Secondary | ICD-10-CM

## 2014-11-27 ENCOUNTER — Other Ambulatory Visit: Payer: Self-pay | Admitting: Family Medicine

## 2014-11-27 ENCOUNTER — Other Ambulatory Visit: Payer: Self-pay

## 2014-11-27 DIAGNOSIS — M810 Age-related osteoporosis without current pathological fracture: Secondary | ICD-10-CM

## 2014-11-30 ENCOUNTER — Ambulatory Visit
Admission: RE | Admit: 2014-11-30 | Discharge: 2014-11-30 | Disposition: A | Discharge status: Home / Self Care | Payer: Medicare Other | Source: Ambulatory Visit | Attending: Family Medicine | Admitting: Family Medicine

## 2014-11-30 ENCOUNTER — Other Ambulatory Visit: Payer: Self-pay | Admitting: Family Medicine

## 2014-11-30 DIAGNOSIS — M810 Age-related osteoporosis without current pathological fracture: Secondary | ICD-10-CM

## 2014-11-30 DIAGNOSIS — M8580 Other specified disorders of bone density and structure, unspecified site: Secondary | ICD-10-CM

## 2014-11-30 DIAGNOSIS — Z1231 Encounter for screening mammogram for malignant neoplasm of breast: Secondary | ICD-10-CM

## 2015-02-17 ENCOUNTER — Ambulatory Visit (INDEPENDENT_AMBULATORY_CARE_PROVIDER_SITE_OTHER): Payer: Medicare Other | Admitting: Ophthalmology

## 2015-02-17 DIAGNOSIS — I10 Essential (primary) hypertension: Secondary | ICD-10-CM | POA: Diagnosis not present

## 2015-02-17 DIAGNOSIS — H35033 Hypertensive retinopathy, bilateral: Secondary | ICD-10-CM

## 2015-02-17 DIAGNOSIS — H338 Other retinal detachments: Secondary | ICD-10-CM | POA: Diagnosis not present

## 2015-02-17 DIAGNOSIS — H43813 Vitreous degeneration, bilateral: Secondary | ICD-10-CM

## 2015-02-17 DIAGNOSIS — E11339 Type 2 diabetes mellitus with moderate nonproliferative diabetic retinopathy without macular edema: Secondary | ICD-10-CM | POA: Diagnosis not present

## 2015-02-17 DIAGNOSIS — H33302 Unspecified retinal break, left eye: Secondary | ICD-10-CM

## 2015-02-17 DIAGNOSIS — E11319 Type 2 diabetes mellitus with unspecified diabetic retinopathy without macular edema: Secondary | ICD-10-CM | POA: Diagnosis not present

## 2015-02-17 NOTE — H&P (Signed)
Sabrina Baker is an 74 y.o. female.   Chief Complaint:blurred vision right eye due to previous retinal detachment. 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  Now needs removal of oil right eye  Past Medical History  Diagnosis Date  . Diabetes mellitus without complication   . Hypercholesteremia   . Hypertension   . Neuromuscular disorder     neuropathy    Past Surgical History  Procedure Laterality Date  . Ankle surgery    . Shoulder arthroscopy Right     plates and screws  . Wrist arthroplasty Left     plates and screws and bone graft  . Eye surgery      detached retna X2  . Heel spur surgery  2012  . Orif ankle fracture Left 06/17/2014    Procedure: OPEN REDUCTION INTERNAL FIXATION (ORIF) LEFT BIMALLEOLAR ANKLE FRACTURE;  Surgeon: Marianna Payment, MD;  Location: [facility];  Service: Orthopedics;  Laterality: Left;    No family history on file. Social History:  reports that she has never smoked. She has never used smokeless tobacco. She reports that she does not drink alcohol or use illicit drugs.  Allergies:  Allergies  Allergen Reactions  . Altace [Ramipril] Anaphylaxis  . Diovan [Valsartan] Other (See Comments)    Hyperkalemia   . Niacin And Related Diarrhea    No prescriptions prior to admission    Review of systems otherwise negative  There were no vitals taken for this visit.  Physical exam: Mental status: oriented x3. Eyes: See eye exam associated with this date of surgery in media tab.  Scanned in by scanning center Ears, Nose, Throat: within normal limits Neck: Within Normal limits General: within normal limits Chest: Within normal limits Breast: deferred Heart: Within normal limits Abdomen: Within normal limits GU: deferred Extremities: within normal limits Skin: within normal limits  Assessment/Plan Silicone oil in vitreous right eye Plan: To [facility] for Pars plana vitrectomy, removal  of silicone oil, laser, gas injection right eye.  Hayden Pedro 02/17/2015, 10:49 AM

## 2015-02-19 ENCOUNTER — Encounter (HOSPITAL_COMMUNITY): Payer: Self-pay | Admitting: Ophthalmology

## 2015-02-19 DIAGNOSIS — H33002 Unspecified retinal detachment with retinal break, left eye: Secondary | ICD-10-CM | POA: Insufficient documentation

## 2015-02-19 DIAGNOSIS — H33001 Unspecified retinal detachment with retinal break, right eye: Secondary | ICD-10-CM | POA: Insufficient documentation

## 2015-03-15 MED ORDER — CEFAZOLIN SODIUM-DEXTROSE 2-3 GM-% IV SOLR
2.0000 g | INTRAVENOUS | Status: AC
  Administered 2015-03-16: 2 g via INTRAVENOUS
  Filled 2015-03-15: qty 50

## 2015-03-15 NOTE — Progress Notes (Signed)
I was unable to reach patient by phone.  I left  A message on voice mail.  I instructed the patient to arrive at [place] entrance per Dr Zigmund Daniel instructions.   Nothing to eat or drink after midnight.   I instructed the patient to take the following medications in the am with just enough water to get them down: medications per Dr Zigmund Daniel insteructions  I asked patient to not wear any lotions, powders, cologne, jewelry, piercing, make-up or nail polish.  I asked the patient to call [phone], in the am if there were any questions or problems.

## 2015-03-16 ENCOUNTER — Ambulatory Visit (HOSPITAL_COMMUNITY): Payer: Medicare Other | Admitting: Anesthesiology

## 2015-03-16 ENCOUNTER — Encounter (HOSPITAL_COMMUNITY): Payer: Self-pay | Admitting: *Deleted

## 2015-03-16 ENCOUNTER — Encounter (HOSPITAL_COMMUNITY)
Admission: RE | Disposition: A | Discharge status: Home / Self Care | Payer: Self-pay | Source: Ambulatory Visit | Attending: Ophthalmology

## 2015-03-16 ENCOUNTER — Ambulatory Visit (HOSPITAL_COMMUNITY)
Admission: RE | Admit: 2015-03-16 | Discharge: 2015-03-17 | Disposition: A | Discharge status: Home / Self Care | Payer: Medicare Other | Source: Ambulatory Visit | Attending: Ophthalmology | Admitting: Ophthalmology

## 2015-03-16 DIAGNOSIS — E119 Type 2 diabetes mellitus without complications: Secondary | ICD-10-CM | POA: Insufficient documentation

## 2015-03-16 DIAGNOSIS — Z794 Long term (current) use of insulin: Secondary | ICD-10-CM | POA: Insufficient documentation

## 2015-03-16 DIAGNOSIS — Z961 Presence of intraocular lens: Secondary | ICD-10-CM | POA: Insufficient documentation

## 2015-03-16 DIAGNOSIS — H33011 Retinal detachment with single break, right eye: Secondary | ICD-10-CM | POA: Insufficient documentation

## 2015-03-16 DIAGNOSIS — I10 Essential (primary) hypertension: Secondary | ICD-10-CM | POA: Diagnosis not present

## 2015-03-16 DIAGNOSIS — H33001 Unspecified retinal detachment with retinal break, right eye: Secondary | ICD-10-CM | POA: Diagnosis not present

## 2015-03-16 HISTORY — DX: Serous retinal detachment, unspecified eye: H33.20

## 2015-03-16 HISTORY — PX: 25 GAUGE PARS PLANA VITRECTOMY WITH 20 GAUGE MVR PORT: SHX6041

## 2015-03-16 HISTORY — PX: INJECTION OF SILICONE OIL: SHX6422

## 2015-03-16 HISTORY — PX: PHOTOCOAGULATION WITH LASER: SHX6027

## 2015-03-16 HISTORY — DX: Other specified disorders of bone density and structure, unspecified site: M85.80

## 2015-03-16 HISTORY — PX: VITRECTOMY: SHX106

## 2015-03-16 HISTORY — DX: Deficiency of other specified B group vitamins: E53.8

## 2015-03-16 HISTORY — DX: Transient cerebral ischemic attack, unspecified: G45.9

## 2015-03-16 HISTORY — DX: Chronic kidney disease, unspecified: N18.9

## 2015-03-16 LAB — CBC
HEMATOCRIT: 40.7 % (ref 36.0–46.0)
HEMOGLOBIN: 12.6 g/dL (ref 12.0–15.0)
MCH: 29.9 pg (ref 26.0–34.0)
MCHC: 31 g/dL (ref 30.0–36.0)
MCV: 96.7 fL (ref 78.0–100.0)
Platelets: 300 10*3/uL (ref 150–400)
RBC: 4.21 MIL/uL (ref 3.87–5.11)
RDW: 16.1 % — ABNORMAL HIGH (ref 11.5–15.5)
WBC: 12.6 10*3/uL — AB (ref 4.0–10.5)

## 2015-03-16 LAB — GLUCOSE, CAPILLARY
GLUCOSE-CAPILLARY: 136 mg/dL — AB (ref 65–99)
Glucose-Capillary: 135 mg/dL — ABNORMAL HIGH (ref 65–99)
Glucose-Capillary: 107 mg/dL — ABNORMAL HIGH (ref 65–99)
Glucose-Capillary: 596 mg/dL (ref 65–99)
Glucose-Capillary: 363 mg/dL — ABNORMAL HIGH (ref 65–99)

## 2015-03-16 SURGERY — 25 GAUGE PARS PLANA VITRECTOMY WITH 20 GAUGE MVR PORT
Anesthesia: General | Site: Eye | Laterality: Right

## 2015-03-16 MED ORDER — GLYCOPYRROLATE 0.2 MG/ML IJ SOLN
INTRAMUSCULAR | Status: AC
  Filled 2015-03-16: qty 2

## 2015-03-16 MED ORDER — PROPOFOL 10 MG/ML IV BOLUS
INTRAVENOUS | Status: DC | PRN
  Administered 2015-03-16: 80 mg via INTRAVENOUS

## 2015-03-16 MED ORDER — TIMOLOL MALEATE 0.5 % OP SOLN: 1.0000 [drp] | Freq: Two times a day (BID) | OPHTHALMIC | Status: DC

## 2015-03-16 MED ORDER — PREDNISOLONE ACETATE 1 % OP SUSP
1.0000 [drp] | Freq: Four times a day (QID) | OPHTHALMIC | Status: DC
  Administered 2015-03-17: 1 [drp] via OPHTHALMIC
  Filled 2015-03-16: qty 5

## 2015-03-16 MED ORDER — DEXTROSE 50 % IV SOLN: 25.0000 mL | INTRAVENOUS | Status: DC | PRN

## 2015-03-16 MED ORDER — PIOGLITAZONE HCL 15 MG PO TABS
15.0000 mg | ORAL_TABLET | Freq: Every evening | ORAL | Status: DC
  Administered 2015-03-16: 15 mg via ORAL
  Filled 2015-03-16 (×2): qty 1

## 2015-03-16 MED ORDER — ONDANSETRON HCL 4 MG/2ML IJ SOLN: 4.0000 mg | Freq: Four times a day (QID) | INTRAMUSCULAR | Status: DC | PRN

## 2015-03-16 MED ORDER — GLYCOPYRROLATE 0.2 MG/ML IJ SOLN
INTRAMUSCULAR | Status: DC | PRN
  Administered 2015-03-16: 0.4 mg via INTRAVENOUS

## 2015-03-16 MED ORDER — LACTATED RINGERS IV SOLN: INTRAVENOUS | Status: DC

## 2015-03-16 MED ORDER — HYDROCODONE-ACETAMINOPHEN 5-325 MG PO TABS
1.0000 | ORAL_TABLET | Freq: Four times a day (QID) | ORAL | Status: DC | PRN
  Administered 2015-03-17: 2 via ORAL
  Filled 2015-03-16: qty 2

## 2015-03-16 MED ORDER — ONDANSETRON HCL 4 MG/2ML IJ SOLN
INTRAMUSCULAR | Status: AC
  Filled 2015-03-16: qty 2

## 2015-03-16 MED ORDER — VITAMIN D 1000 UNITS PO TABS
1000.0000 [IU] | ORAL_TABLET | Freq: Every day | ORAL | Status: DC
  Administered 2015-03-17: 1000 [IU] via ORAL
  Filled 2015-03-16: qty 1

## 2015-03-16 MED ORDER — FERROUS SULFATE 325 (65 FE) MG PO TABS
325.0000 mg | ORAL_TABLET | Freq: Three times a day (TID) | ORAL | Status: DC
  Administered 2015-03-16 – 2015-03-17 (×2): 325 mg via ORAL
  Filled 2015-03-16 (×2): qty 1

## 2015-03-16 MED ORDER — LIDOCAINE HCL 2 % IJ SOLN
INTRAMUSCULAR | Status: AC
  Filled 2015-03-16: qty 20

## 2015-03-16 MED ORDER — INSULIN GLARGINE 300 UNIT/ML ~~LOC~~ SOPN: 20.0000 [IU] | PEN_INJECTOR | Freq: Every morning | SUBCUTANEOUS | Status: DC

## 2015-03-16 MED ORDER — ROCURONIUM BROMIDE 100 MG/10ML IV SOLN
INTRAVENOUS | Status: DC | PRN
  Administered 2015-03-16: 30 mg via INTRAVENOUS

## 2015-03-16 MED ORDER — MIDAZOLAM HCL 2 MG/2ML IJ SOLN
INTRAMUSCULAR | Status: AC
Start: 2015-03-16 — End: 2015-03-16
  Filled 2015-03-16: qty 4

## 2015-03-16 MED ORDER — SODIUM CHLORIDE 0.9 % IV SOLN
INTRAVENOUS | Status: DC | PRN
Start: 2015-03-16 — End: 2015-03-16
  Administered 2015-03-16: 12:00:00 via INTRAVENOUS

## 2015-03-16 MED ORDER — HYALURONIDASE HUMAN 150 UNIT/ML IJ SOLN
INTRAMUSCULAR | Status: AC
  Filled 2015-03-16: qty 1

## 2015-03-16 MED ORDER — BSS PLUS IO SOLN
INTRAOCULAR | Status: AC
  Filled 2015-03-16: qty 500

## 2015-03-16 MED ORDER — ARTIFICIAL TEARS OP OINT
TOPICAL_OINTMENT | Freq: Four times a day (QID) | OPHTHALMIC | Status: DC
  Filled 2015-03-16: qty 3.5

## 2015-03-16 MED ORDER — ACETAMINOPHEN 325 MG PO TABS: 325.0000 mg | ORAL_TABLET | ORAL | Status: DC | PRN

## 2015-03-16 MED ORDER — ACETAZOLAMIDE SODIUM 500 MG IJ SOLR
500.0000 mg | Freq: Once | INTRAMUSCULAR | Status: AC
  Administered 2015-03-17: 500 mg via INTRAVENOUS
  Filled 2015-03-16: qty 500

## 2015-03-16 MED ORDER — ASPIRIN EC 325 MG PO TBEC
325.0000 mg | DELAYED_RELEASE_TABLET | Freq: Every day | ORAL | Status: DC
  Administered 2015-03-17: 325 mg via ORAL
  Filled 2015-03-16: qty 1

## 2015-03-16 MED ORDER — GATIFLOXACIN 0.5 % OP SOLN
1.0000 [drp] | OPHTHALMIC | Status: AC | PRN
  Administered 2015-03-16 (×3): 1 [drp] via OPHTHALMIC
  Filled 2015-03-16: qty 2.5

## 2015-03-16 MED ORDER — EPINEPHRINE HCL 1 MG/ML IJ SOLN
INTRAMUSCULAR | Status: AC
  Filled 2015-03-16: qty 1

## 2015-03-16 MED ORDER — FENTANYL CITRATE (PF) 100 MCG/2ML IJ SOLN
INTRAMUSCULAR | Status: AC
  Filled 2015-03-16: qty 2

## 2015-03-16 MED ORDER — SODIUM CHLORIDE 0.9 % IV SOLN
INTRAVENOUS | Status: DC
  Administered 2015-03-16: 3 [IU]/h via INTRAVENOUS
  Filled 2015-03-16: qty 2.5

## 2015-03-16 MED ORDER — SODIUM HYALURONATE 10 MG/ML IO SOLN
INTRAOCULAR | Status: DC | PRN
  Administered 2015-03-16: 0.85 mL via INTRAOCULAR

## 2015-03-16 MED ORDER — BUPIVACAINE-EPINEPHRINE (PF) 0.25% -1:200000 IJ SOLN
INTRAMUSCULAR | Status: AC
  Filled 2015-03-16: qty 30

## 2015-03-16 MED ORDER — BACITRACIN-POLYMYXIN B 500-10000 UNIT/GM OP OINT
TOPICAL_OINTMENT | OPHTHALMIC | Status: AC
  Filled 2015-03-16: qty 3.5

## 2015-03-16 MED ORDER — CYCLOPENTOLATE HCL 1 % OP SOLN
1.0000 [drp] | OPHTHALMIC | Status: AC | PRN
  Administered 2015-03-16 (×3): 1 [drp] via OPHTHALMIC
  Filled 2015-03-16: qty 2

## 2015-03-16 MED ORDER — COLCHICINE 0.6 MG PO TABS: 0.6000 mg | ORAL_TABLET | Freq: Every day | ORAL | Status: DC | PRN

## 2015-03-16 MED ORDER — DEXTROSE-NACL 5-0.45 % IV SOLN
INTRAVENOUS | Status: DC
  Administered 2015-03-17: 02:00:00 via INTRAVENOUS

## 2015-03-16 MED ORDER — ATORVASTATIN CALCIUM 10 MG PO TABS
10.0000 mg | ORAL_TABLET | Freq: Every day | ORAL | Status: DC
  Administered 2015-03-17: 10 mg via ORAL
  Filled 2015-03-16: qty 1

## 2015-03-16 MED ORDER — BUPIVACAINE HCL (PF) 0.75 % IJ SOLN
INTRAMUSCULAR | Status: AC
  Filled 2015-03-16: qty 10

## 2015-03-16 MED ORDER — TRIAMCINOLONE ACETONIDE 40 MG/ML IJ SUSP
INTRAMUSCULAR | Status: AC
  Filled 2015-03-16: qty 5

## 2015-03-16 MED ORDER — SODIUM CHLORIDE 0.45 % IV SOLN
INTRAVENOUS | Status: DC
  Administered 2015-03-16: 23:00:00 via INTRAVENOUS

## 2015-03-16 MED ORDER — HYPROMELLOSE (GONIOSCOPIC) 2.5 % OP SOLN
OPHTHALMIC | Status: AC
  Filled 2015-03-16: qty 15

## 2015-03-16 MED ORDER — TIMOLOL MALEATE 0.5 % OP SOLN
1.0000 [drp] | Freq: Two times a day (BID) | OPHTHALMIC | Status: DC
  Filled 2015-03-16: qty 5

## 2015-03-16 MED ORDER — POLYMYXIN B SULFATE 500000 UNITS IJ SOLR
INTRAMUSCULAR | Status: AC
  Filled 2015-03-16: qty 1

## 2015-03-16 MED ORDER — DULOXETINE HCL 30 MG PO CPEP
30.0000 mg | ORAL_CAPSULE | Freq: Two times a day (BID) | ORAL | Status: DC
  Administered 2015-03-16 – 2015-03-17 (×2): 30 mg via ORAL
  Filled 2015-03-16 (×3): qty 1

## 2015-03-16 MED ORDER — TEMAZEPAM 15 MG PO CAPS: 15.0000 mg | ORAL_CAPSULE | Freq: Every evening | ORAL | Status: DC | PRN

## 2015-03-16 MED ORDER — LIDOCAINE HCL (CARDIAC) 20 MG/ML IV SOLN
INTRAVENOUS | Status: DC | PRN
  Administered 2015-03-16: 100 mg via INTRAVENOUS

## 2015-03-16 MED ORDER — ONDANSETRON HCL 4 MG/2ML IJ SOLN
INTRAMUSCULAR | Status: DC | PRN
  Administered 2015-03-16: 4 mg via INTRAVENOUS

## 2015-03-16 MED ORDER — DEXAMETHASONE SODIUM PHOSPHATE 10 MG/ML IJ SOLN
INTRAMUSCULAR | Status: AC
  Filled 2015-03-16: qty 1

## 2015-03-16 MED ORDER — DULAGLUTIDE 1.5 MG/0.5ML ~~LOC~~ SOAJ: 1.5000 mg | SUBCUTANEOUS | Status: DC

## 2015-03-16 MED ORDER — PREDNISOLONE ACETATE 1 % OP SUSP: 1.0000 [drp] | Freq: Two times a day (BID) | OPHTHALMIC | Status: DC

## 2015-03-16 MED ORDER — FUROSEMIDE 20 MG PO TABS
20.0000 mg | ORAL_TABLET | Freq: Every day | ORAL | Status: DC
  Administered 2015-03-17: 20 mg via ORAL
  Filled 2015-03-16: qty 1

## 2015-03-16 MED ORDER — MAGNESIUM HYDROXIDE 400 MG/5ML PO SUSP: 15.0000 mL | Freq: Four times a day (QID) | ORAL | Status: DC | PRN

## 2015-03-16 MED ORDER — FENTANYL CITRATE (PF) 100 MCG/2ML IJ SOLN
25.0000 ug | INTRAMUSCULAR | Status: DC | PRN
  Administered 2015-03-16 (×4): 25 ug via INTRAVENOUS

## 2015-03-16 MED ORDER — ACETAMINOPHEN 500 MG PO TABS
500.0000 mg | ORAL_TABLET | Freq: Two times a day (BID) | ORAL | Status: DC
  Administered 2015-03-16 – 2015-03-17 (×2): 500 mg via ORAL
  Filled 2015-03-16 (×2): qty 1

## 2015-03-16 MED ORDER — LATANOPROST 0.005 % OP SOLN
1.0000 [drp] | Freq: Every day | OPHTHALMIC | Status: DC
  Filled 2015-03-16: qty 2.5

## 2015-03-16 MED ORDER — INSULIN REGULAR BOLUS VIA INFUSION
0.0000 [IU] | Freq: Three times a day (TID) | INTRAVENOUS | Status: DC
  Filled 2015-03-16: qty 10

## 2015-03-16 MED ORDER — MORPHINE SULFATE (PF) 2 MG/ML IV SOLN: 1.0000 mg | INTRAVENOUS | Status: DC | PRN

## 2015-03-16 MED ORDER — VITAMIN B-12 1000 MCG PO TABS
1000.0000 ug | ORAL_TABLET | Freq: Every day | ORAL | Status: DC
  Administered 2015-03-17: 1000 ug via ORAL
  Filled 2015-03-16: qty 1

## 2015-03-16 MED ORDER — HYDROCODONE-ACETAMINOPHEN 5-325 MG PO TABS: 1.0000 | ORAL_TABLET | ORAL | Status: DC | PRN

## 2015-03-16 MED ORDER — SODIUM CHLORIDE 0.9 % IV SOLN
INTRAVENOUS | Status: DC
  Administered 2015-03-16: 10:00:00 via INTRAVENOUS

## 2015-03-16 MED ORDER — ALENDRONATE SODIUM 70 MG PO TABS: 70.0000 mg | ORAL_TABLET | ORAL | Status: DC

## 2015-03-16 MED ORDER — MIDAZOLAM HCL 5 MG/5ML IJ SOLN
INTRAMUSCULAR | Status: DC | PRN
  Administered 2015-03-16: 2 mg via INTRAVENOUS

## 2015-03-16 MED ORDER — CARVEDILOL 25 MG PO TABS
25.0000 mg | ORAL_TABLET | Freq: Two times a day (BID) | ORAL | Status: DC
  Administered 2015-03-16 – 2015-03-17 (×2): 25 mg via ORAL
  Filled 2015-03-16 (×2): qty 1

## 2015-03-16 MED ORDER — BRIMONIDINE TARTRATE 0.15 % OP SOLN
1.0000 [drp] | Freq: Three times a day (TID) | OPHTHALMIC | Status: DC
  Filled 2015-03-16: qty 5

## 2015-03-16 MED ORDER — PHENYLEPHRINE 40 MCG/ML (10ML) SYRINGE FOR IV PUSH (FOR BLOOD PRESSURE SUPPORT)
PREFILLED_SYRINGE | INTRAVENOUS | Status: AC
  Filled 2015-03-16: qty 10

## 2015-03-16 MED ORDER — BUPIVACAINE HCL (PF) 0.75 % IJ SOLN
INTRAMUSCULAR | Status: DC | PRN
  Administered 2015-03-16: 10 mL

## 2015-03-16 MED ORDER — BSS IO SOLN
INTRAOCULAR | Status: DC | PRN
  Administered 2015-03-16: 15 mL via INTRAOCULAR

## 2015-03-16 MED ORDER — MUPIROCIN 2 % EX OINT
1.0000 "application " | TOPICAL_OINTMENT | Freq: Two times a day (BID) | CUTANEOUS | Status: DC
  Administered 2015-03-16: 1 via NASAL

## 2015-03-16 MED ORDER — BACITRACIN-POLYMYXIN B 500-10000 UNIT/GM OP OINT
TOPICAL_OINTMENT | OPHTHALMIC | Status: DC | PRN
  Administered 2015-03-16: 1 via OPHTHALMIC

## 2015-03-16 MED ORDER — LIDOCAINE HCL 2 % IJ SOLN: INTRAMUSCULAR | Status: DC | PRN

## 2015-03-16 MED ORDER — SODIUM CHLORIDE 0.9 % IJ SOLN
INTRAMUSCULAR | Status: AC
  Filled 2015-03-16: qty 10

## 2015-03-16 MED ORDER — SODIUM CHLORIDE 0.9 % IJ SOLN
INTRAMUSCULAR | Status: DC | PRN
  Administered 2015-03-16: 12:00:00

## 2015-03-16 MED ORDER — BACITRACIN-POLYMYXIN B 500-10000 UNIT/GM OP OINT
1.0000 | TOPICAL_OINTMENT | Freq: Three times a day (TID) | OPHTHALMIC | Status: DC
Start: 2015-03-16 — End: 2015-03-17
  Administered 2015-03-17: 1 via OPHTHALMIC
  Filled 2015-03-16: qty 3.5

## 2015-03-16 MED ORDER — MAGNESIUM GLUCONATE 500 MG PO TABS
500.0000 mg | ORAL_TABLET | Freq: Two times a day (BID) | ORAL | Status: DC
  Administered 2015-03-16 – 2015-03-17 (×2): 500 mg via ORAL
  Filled 2015-03-16 (×3): qty 1

## 2015-03-16 MED ORDER — ATROPINE SULFATE 1 % OP SOLN
OPHTHALMIC | Status: AC
  Filled 2015-03-16: qty 5

## 2015-03-16 MED ORDER — AMLODIPINE BESYLATE 10 MG PO TABS
10.0000 mg | ORAL_TABLET | Freq: Every day | ORAL | Status: DC
  Administered 2015-03-17: 10 mg via ORAL
  Filled 2015-03-16: qty 1

## 2015-03-16 MED ORDER — ALLOPURINOL 300 MG PO TABS: 300.0000 mg | ORAL_TABLET | Freq: Every day | ORAL | Status: DC | PRN

## 2015-03-16 MED ORDER — SODIUM CHLORIDE 0.9 % IV SOLN
10000.0000 ug | INTRAVENOUS | Status: DC | PRN
  Administered 2015-03-16: 40 ug via INTRAVENOUS

## 2015-03-16 MED ORDER — POLYETHYL GLYCOL-PROPYL GLYCOL 0.4-0.3 % OP GEL
Freq: Four times a day (QID) | OPHTHALMIC | Status: DC
  Filled 2015-03-16: qty 10

## 2015-03-16 MED ORDER — GENTAMICIN SULFATE 40 MG/ML IJ SOLN
INTRAMUSCULAR | Status: AC
  Filled 2015-03-16: qty 2

## 2015-03-16 MED ORDER — ROCURONIUM BROMIDE 50 MG/5ML IV SOLN
INTRAVENOUS | Status: AC
  Filled 2015-03-16: qty 1

## 2015-03-16 MED ORDER — BSS PLUS IO SOLN
INTRAOCULAR | Status: DC | PRN
  Administered 2015-03-16 (×2): 1 via INTRAOCULAR

## 2015-03-16 MED ORDER — BSS IO SOLN
INTRAOCULAR | Status: AC
  Filled 2015-03-16: qty 15

## 2015-03-16 MED ORDER — GATIFLOXACIN 0.5 % OP SOLN
1.0000 [drp] | Freq: Four times a day (QID) | OPHTHALMIC | Status: DC
  Administered 2015-03-17: 1 [drp] via OPHTHALMIC
  Filled 2015-03-16: qty 2.5

## 2015-03-16 MED ORDER — EPINEPHRINE HCL 1 MG/ML IJ SOLN
INTRAMUSCULAR | Status: DC | PRN
  Administered 2015-03-16: 1 mg

## 2015-03-16 MED ORDER — PANTOPRAZOLE SODIUM 40 MG PO TBEC
40.0000 mg | DELAYED_RELEASE_TABLET | Freq: Two times a day (BID) | ORAL | Status: DC
  Administered 2015-03-16 – 2015-03-17 (×2): 40 mg via ORAL
  Filled 2015-03-16 (×2): qty 1

## 2015-03-16 MED ORDER — INSULIN GLARGINE 100 UNIT/ML ~~LOC~~ SOLN
20.0000 [IU] | Freq: Every day | SUBCUTANEOUS | Status: DC
  Administered 2015-03-17: 20 [IU] via SUBCUTANEOUS
  Filled 2015-03-16: qty 0.2

## 2015-03-16 MED ORDER — ACETAZOLAMIDE SODIUM 500 MG IJ SOLR
INTRAMUSCULAR | Status: AC
Start: 2015-03-16 — End: 2015-03-16
  Filled 2015-03-16: qty 500

## 2015-03-16 MED ORDER — SUCCINYLCHOLINE CHLORIDE 200 MG/10ML IV SOSY
PREFILLED_SYRINGE | INTRAVENOUS | Status: DC | PRN
  Administered 2015-03-16: 100 mg via INTRAVENOUS

## 2015-03-16 MED ORDER — SODIUM HYALURONATE 10 MG/ML IO SOLN
INTRAOCULAR | Status: AC
  Filled 2015-03-16: qty 0.85

## 2015-03-16 MED ORDER — IRON 325 (65 FE) MG PO TABS
1.0000 | ORAL_TABLET | Freq: Three times a day (TID) | ORAL | Status: DC
  Filled 2015-03-16: qty 1

## 2015-03-16 MED ORDER — SODIUM CHLORIDE 0.45 % IV SOLN
INTRAVENOUS | Status: DC
  Administered 2015-03-16: 15:00:00 via INTRAVENOUS

## 2015-03-16 MED ORDER — TETRACAINE HCL 0.5 % OP SOLN
2.0000 [drp] | Freq: Once | OPHTHALMIC | Status: DC
  Filled 2015-03-16: qty 2

## 2015-03-16 MED ORDER — PHENYLEPHRINE HCL 2.5 % OP SOLN
1.0000 [drp] | OPHTHALMIC | Status: AC | PRN
  Administered 2015-03-16 (×3): 1 [drp] via OPHTHALMIC
  Filled 2015-03-16: qty 2

## 2015-03-16 MED ORDER — BRIMONIDINE TARTRATE 0.2 % OP SOLN: 1.0000 [drp] | Freq: Two times a day (BID) | OPHTHALMIC | Status: DC

## 2015-03-16 MED ORDER — CALCIUM CARBONATE-VITAMIN D 500-200 MG-UNIT PO TABS
1.0000 | ORAL_TABLET | Freq: Two times a day (BID) | ORAL | Status: DC
  Administered 2015-03-16 – 2015-03-17 (×2): 1 via ORAL
  Filled 2015-03-16 (×2): qty 1

## 2015-03-16 MED ORDER — TROPICAMIDE 1 % OP SOLN
1.0000 [drp] | OPHTHALMIC | Status: AC | PRN
  Administered 2015-03-16 (×3): 1 [drp] via OPHTHALMIC
  Filled 2015-03-16: qty 3

## 2015-03-16 MED ORDER — ATROPINE SULFATE 1 % OP SOLN
OPHTHALMIC | Status: DC | PRN
  Administered 2015-03-16: 1 [drp] via OPHTHALMIC

## 2015-03-16 MED ORDER — PROMETHAZINE HCL 25 MG/ML IJ SOLN: 6.2500 mg | INTRAMUSCULAR | Status: DC | PRN

## 2015-03-16 MED ORDER — NEOSTIGMINE METHYLSULFATE 10 MG/10ML IV SOLN
INTRAVENOUS | Status: DC | PRN
  Administered 2015-03-16: 3 mg via INTRAVENOUS

## 2015-03-16 MED ORDER — DEXAMETHASONE SODIUM PHOSPHATE 10 MG/ML IJ SOLN
INTRAMUSCULAR | Status: DC | PRN
  Administered 2015-03-16: 10 mg via INTRAVENOUS

## 2015-03-16 MED ORDER — FENTANYL CITRATE (PF) 250 MCG/5ML IJ SOLN
INTRAMUSCULAR | Status: AC
  Filled 2015-03-16: qty 5

## 2015-03-16 SURGICAL SUPPLY — 80 items
APL SRG 3 HI ABS STRL LF PLS (MISCELLANEOUS)
APPLICATOR DR MATTHEWS STRL (MISCELLANEOUS) IMPLANT
BALL CTTN LRG ABS STRL LF (GAUZE/BANDAGES/DRESSINGS) ×3
BLADE EYE CATARACT 19 1.4 BEAV (BLADE) IMPLANT
BLADE MVR KNIFE 19G (BLADE) ×1 IMPLANT
BLADE MVR KNIFE 20G (BLADE) IMPLANT
CANNULA DUAL BORE 23G (CANNULA) IMPLANT
CANNULA VLV SOFT TIP 25G (OPHTHALMIC) ×1 IMPLANT
CANNULA VLV SOFT TIP 25GA (OPHTHALMIC) ×2 IMPLANT
CORDS BIPOLAR (ELECTRODE) ×2 IMPLANT
COTTONBALL LRG STERILE PKG (GAUZE/BANDAGES/DRESSINGS) ×6 IMPLANT
COVER MAYO STAND STRL (DRAPES) ×2 IMPLANT
DRAPE INCISE 51X51 W/FILM STRL (DRAPES) IMPLANT
DRAPE OPHTHALMIC 77X100 STRL (CUSTOM PROCEDURE TRAY) ×2 IMPLANT
FILTER BLUE MILLIPORE (MISCELLANEOUS) IMPLANT
FILTER STRAW FLUID ASPIR (MISCELLANEOUS) ×1 IMPLANT
FORCEPS ECKARDT ILM 25G SERR (OPHTHALMIC RELATED) IMPLANT
FORCEPS GRIESHABER ILM 25G A (INSTRUMENTS) IMPLANT
FORCEPS ILM 25G DSP TIP (MISCELLANEOUS) IMPLANT
GLOVE BIOGEL PI IND STRL 6.5 (GLOVE) IMPLANT
GLOVE BIOGEL PI INDICATOR 6.5 (GLOVE) ×2
GLOVE SS BIOGEL STRL SZ 6.5 (GLOVE) ×1 IMPLANT
GLOVE SS BIOGEL STRL SZ 7 (GLOVE) ×1 IMPLANT
GLOVE SUPERSENSE BIOGEL SZ 6.5 (GLOVE) ×1
GLOVE SUPERSENSE BIOGEL SZ 7 (GLOVE) ×1
GLOVE SURG 8.5 LATEX PF (GLOVE) ×2 IMPLANT
GLOVE SURG SS PI 6.5 STRL IVOR (GLOVE) ×1 IMPLANT
GOWN STRL REUS W/ TWL LRG LVL3 (GOWN DISPOSABLE) ×3 IMPLANT
GOWN STRL REUS W/TWL LRG LVL3 (GOWN DISPOSABLE) ×6
HANDLE PNEUMATIC FOR CONSTEL (OPHTHALMIC) IMPLANT
KIT BASIN OR (CUSTOM PROCEDURE TRAY) ×2 IMPLANT
KIT ROOM TURNOVER OR (KITS) ×2 IMPLANT
MICROPICK 25G (MISCELLANEOUS)
NDL 18GX1X1/2 (RX/OR ONLY) (NEEDLE) ×1 IMPLANT
NDL 25GX 5/8IN NON SAFETY (NEEDLE) ×1 IMPLANT
NDL FILTER BLUNT 18X1 1/2 (NEEDLE) ×1 IMPLANT
NDL HYPO 30X.5 LL (NEEDLE) ×2 IMPLANT
NEEDLE 18GX1X1/2 (RX/OR ONLY) (NEEDLE) ×2 IMPLANT
NEEDLE 25GX 5/8IN NON SAFETY (NEEDLE) ×2 IMPLANT
NEEDLE 27GAX1X1/2 (NEEDLE) IMPLANT
NEEDLE BACKFLUSH 1281 A1 (NEEDLE) ×1 IMPLANT
NEEDLE FILTER BLUNT 18X 1/2SAF (NEEDLE) ×1
NEEDLE FILTER BLUNT 18X1 1/2 (NEEDLE) ×1 IMPLANT
NEEDLE HYPO 30X.5 LL (NEEDLE) ×4 IMPLANT
NS IRRIG 1000ML POUR BTL (IV SOLUTION) ×2 IMPLANT
PACK VITRECTOMY CUSTOM (CUSTOM PROCEDURE TRAY) ×2 IMPLANT
PAD ARMBOARD 7.5X6 YLW CONV (MISCELLANEOUS) ×4 IMPLANT
PAK PIK VITRECTOMY CVS 25GA (OPHTHALMIC) ×2 IMPLANT
PENCIL BIPOLAR 25GA STR DISP (OPHTHALMIC RELATED) IMPLANT
PIC ILLUMINATED 25G (OPHTHALMIC) ×2
PICK MICROPICK 25G (MISCELLANEOUS) IMPLANT
PIK ILLUMINATED 25G (OPHTHALMIC) ×1 IMPLANT
PROBE CONSTELLATION 25 GA PLUS (OPHTHALMIC) ×1 IMPLANT
PROBE LASER ILLUM FLEX CVD 25G (OPHTHALMIC) ×2 IMPLANT
REPL STRA BRUSH NDL (NEEDLE) IMPLANT
REPL STRA BRUSH NEEDLE (NEEDLE) ×2 IMPLANT
RESERVOIR BACK FLUSH (MISCELLANEOUS) IMPLANT
ROLLS DENTAL (MISCELLANEOUS) ×4 IMPLANT
SCRAPER DIAMOND 25GA (OPHTHALMIC RELATED) ×1 IMPLANT
SCRAPER DIAMOND DUST MEMBRANE (MISCELLANEOUS) ×2 IMPLANT
SET INJECTOR OIL FLUID CONSTEL (OPHTHALMIC) ×1 IMPLANT
SPONGE SURGIFOAM ABS GEL 12-7 (HEMOSTASIS) ×2 IMPLANT
STOPCOCK 4 WAY LG BORE MALE ST (IV SETS) IMPLANT
SUT CHROMIC 7 0 TG140 8 (SUTURE) IMPLANT
SUT ETHILON 10 0 CS140 6 (SUTURE) IMPLANT
SUT ETHILON 9 0 TG140 8 (SUTURE) ×2 IMPLANT
SUT POLY NON ABSORB 10-0 8 STR (SUTURE) IMPLANT
SUT SILK 4 0 RB 1 (SUTURE) IMPLANT
SYR 20CC LL (SYRINGE) ×2 IMPLANT
SYR BULB 3OZ (MISCELLANEOUS) ×2 IMPLANT
SYR BULB IRRIGATION 50ML (SYRINGE) ×1 IMPLANT
SYR TB 1ML LUER SLIP (SYRINGE) ×2 IMPLANT
SYRINGE 10CC LL (SYRINGE) IMPLANT
TAPE SURG TRANSPORE 1 IN (GAUZE/BANDAGES/DRESSINGS) IMPLANT
TAPE SURGICAL TRANSPORE 1 IN (GAUZE/BANDAGES/DRESSINGS) ×1
TOWEL OR 17X24 6PK STRL BLUE (TOWEL DISPOSABLE) ×2 IMPLANT
TOWEL OR 17X26 10 PK STRL BLUE (TOWEL DISPOSABLE) ×2 IMPLANT
TUBING HIGH PRESS EXTEN 6IN (TUBING) IMPLANT
WATER STERILE IRR 1000ML POUR (IV SOLUTION) ×2 IMPLANT
WIPE INSTRUMENT VISIWIPE 73X73 (MISCELLANEOUS) ×2 IMPLANT

## 2015-03-16 NOTE — Anesthesia Postprocedure Evaluation (Signed)
  Anesthesia Post-op Note  Patient: Sabrina Baker  Procedure(s) Performed: Procedure(s) (LRB): 25 GAUGE PARS PLANA VITRECTOMY WITH 23 GAUGE MVR PORT (Right) PHOTOCOAGULATION WITH LASER (Right) Extraction OF SILICONE OIL (Right)  Patient Location: PACU  Anesthesia Type: General  Level of Consciousness: awake and alert   Airway and Oxygen Therapy: Patient Spontanous Breathing  Post-op Pain: mild  Post-op Assessment: Post-op Vital signs reviewed, Patient's Cardiovascular Status Stable, Respiratory Function Stable, Patent Airway and No signs of Nausea or vomiting  Last Vitals:  Filed Vitals:   03/16/15 1319  BP: 131/46  Pulse: 70  Temp:   Resp: 17    Post-op Vital Signs: stable   Complications: No apparent anesthesia complications

## 2015-03-16 NOTE — H&P (Signed)
I examined the patient today and there is no change in the medical status 

## 2015-03-16 NOTE — Op Note (Signed)
NAME:  Sabrina Baker, Sabrina Baker NO.:  [id number]  MEDICAL RECORD NO.:  [id number]  LOCATION:  [id number]                        FACILITY:  [facility]  PHYSICIAN:  Chrystie Nose. Zigmund Daniel, M.D. 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  PROCEDURES:  Pars plana vitrectomy, removal of silicone oil, laser gas fluid exchange, right eye.  SURGEON:  Chrystie Nose. Zigmund Daniel, M.D.  ASSISTANT:  Deatra Ina, SA.  ANESTHESIA:  General.  DETAILS:  After usual prep and drape, 25-gauge trocars were placed at 8 and 10 o'clock, 23-gauge trocar was placed at 2 o'clock.  Provisc placed on the corneal surface.  The pars plana vitrectomy was begun just behind the pseudophakic infusion.  BSS plus was performed as silicone oil was removed.  The 23-gauge extractor was used to remove all silicone oil from the vitreous cavity.  This required some time.  The intraocular lens was then cleaned with the vitreous cutter and silicone oil was wiped from the posterior surface of the pseudophakia.  Additional silicone oil was seen in the anterior segment behind the iris, and this was carefully removed under low suction and rapid cutting with the vitrectomy cutter.  The endolaser was positioned in the eye, 319 burns placed around the retinal periphery.  The power was 300 mW, 1000 microns each and 0.1 seconds each.  Total gas fluid exchange was carried out with the vitreous cutter and then the silicone tip suction line was used to extract remainder of fluid from the vitreous cavity.  No remaining silicone oil was seen.  The 25-gauge trocars were removed.  The wounds were tested and found to be secure.  Polymyxin and gentamicin were irrigated into tenon space.  Atropine solution was applied.  Marcaine was injected around the globe for  postoperative pain.  Closing tension was 10 with a [place] tonometer.  Polysporin ophthalmic ointment and a patch and shield were placed.  The patient was awakened and taken to Recovery in satisfactory condition.     Chrystie Nose. Zigmund Daniel, M.D.     JDM/MEDQ  D:  03/16/2015  T:  03/16/2015  Job:  [id number]

## 2015-03-16 NOTE — Anesthesia Preprocedure Evaluation (Signed)
Anesthesia Evaluation  Patient identified by MRN, date of birth, ID band Patient awake    Reviewed: Allergy & Precautions, NPO status , Patient's Chart, lab work & pertinent test results  Airway Mallampati: II  TM Distance: >3 FB Neck ROM: Full    Dental no notable dental hx.    Pulmonary neg pulmonary ROS,    Pulmonary exam normal breath sounds clear to auscultation       Cardiovascular hypertension, Pt. on medications Normal cardiovascular exam Rhythm:Regular Rate:Normal     Neuro/Psych TIAnegative psych ROS   GI/Hepatic negative GI ROS, Neg liver ROS,   Endo/Other  diabetes, Insulin Dependent  Renal/GU negative Renal ROS  negative genitourinary   Musculoskeletal negative musculoskeletal ROS (+)   Abdominal   Peds negative pediatric ROS (+)  Hematology negative hematology ROS (+)   Anesthesia Other Findings   Reproductive/Obstetrics negative OB ROS                             Anesthesia Physical Anesthesia Plan  ASA: III  Anesthesia Plan: General   Post-op Pain Management:    Induction: Intravenous  Airway Management Planned: Oral ETT  Additional Equipment:   Intra-op Plan:   Post-operative Plan: Extubation in OR  Informed Consent: I have reviewed the patients History and Physical, chart, labs and discussed the procedure including the risks, benefits and alternatives for the proposed anesthesia with the patient or authorized representative who has indicated his/her understanding and acceptance.   Dental advisory given  Plan Discussed with: CRNA and Surgeon  Anesthesia Plan Comments:         Anesthesia Quick Evaluation

## 2015-03-16 NOTE — Brief Op Note (Signed)
Brief Operative note   Preoperative diagnosis:  retinal detachment OD / removal silicone oil right eye Postoperative diagnosis  * No Diagnosis Codes entered *  Procedures: Pars plana vitrectomy, laser, gas injection, removal of silicone oil from vitreous. Right eye  Surgeon:  Hayden Pedro, MD...  Assistant:  Deatra Ina SA   Anesthesia: General  Specimen: none  Estimated blood loss:  1cc  Complications: none  Patient sent to PACU in good condition  Composed by Hayden Pedro MD  Dictation number: [id number]

## 2015-03-16 NOTE — Progress Notes (Signed)
CRITICAL VALUE ALERT  Critical value received:  BS 596  Date of notification:  03/16/2015  Time of notification:  2130  Critical value read back:No.  Nurse who received alert:  Glenna Fellows  MD notified (1st page):  Dr. Zigmund Daniel (called directly)  Time of first page:  2137  MD notified (2nd page):  Time of second page:  Responding MD:  Zigmund Daniel  Time MD responded:    Dr. Zigmund Daniel stated he was going to call house coverage to help [place] set up the [place]. Graceann Congress

## 2015-03-16 NOTE — Transfer of Care (Signed)
Immediate Anesthesia Transfer of Care Note  Patient: Sabrina Baker  Procedure(s) Performed: Procedure(s): 25 GAUGE PARS PLANA VITRECTOMY WITH 23 GAUGE MVR PORT (Right) PHOTOCOAGULATION WITH LASER (Right) Extraction OF SILICONE OIL (Right)  Patient Location: PACU  Anesthesia Type:General  Level of Consciousness: awake  Airway & Oxygen Therapy: Patient Spontanous Breathing and Patient connected to face mask oxygen  Post-op Assessment: Report given to RN and Post -op Vital signs reviewed and stable  Post vital signs: Reviewed and stable  Last Vitals:  Filed Vitals:   03/16/15 0936  BP: 147/70  Pulse: 66  Temp: 36.9 C  Resp: 20    Complications: No apparent anesthesia complications

## 2015-03-16 NOTE — Anesthesia Procedure Notes (Signed)
Procedure Name: Intubation Date/Time: 03/16/2015 11:43 AM Performed by: Manus Gunning, Codie Krogh J Pre-anesthesia Checklist: Patient identified, Timeout performed, Emergency Drugs available, Suction available and Patient being monitored Patient Re-evaluated:Patient Re-evaluated prior to inductionOxygen Delivery Method: Circle system utilized Preoxygenation: Pre-oxygenation with 100% oxygen Intubation Type: IV induction Ventilation: Mask ventilation without difficulty Laryngoscope Size: Mac and 3 Grade View: Grade I Tube type: Oral Tube size: 7.0 mm Number of attempts: 1 Placement Confirmation: ETT inserted through vocal cords under direct vision,  breath sounds checked- equal and bilateral and positive ETCO2 Secured at: 21 cm Tube secured with: Tape Dental Injury: Teeth and Oropharynx as per pre-operative assessment

## 2015-03-17 ENCOUNTER — Encounter (HOSPITAL_COMMUNITY): Payer: Self-pay | Admitting: General Practice

## 2015-03-17 DIAGNOSIS — H33011 Retinal detachment with single break, right eye: Secondary | ICD-10-CM | POA: Diagnosis not present

## 2015-03-17 LAB — GLUCOSE, CAPILLARY
GLUCOSE-CAPILLARY: 106 mg/dL — AB (ref 65–99)
GLUCOSE-CAPILLARY: 293 mg/dL — AB (ref 65–99)
GLUCOSE-CAPILLARY: 331 mg/dL — AB (ref 65–99)
Glucose-Capillary: 208 mg/dL — ABNORMAL HIGH (ref 65–99)
Glucose-Capillary: 124 mg/dL — ABNORMAL HIGH (ref 65–99)
Glucose-Capillary: 149 mg/dL — ABNORMAL HIGH (ref 65–99)
Glucose-Capillary: 171 mg/dL — ABNORMAL HIGH (ref 65–99)
Glucose-Capillary: 167 mg/dL — ABNORMAL HIGH (ref 65–99)

## 2015-03-17 MED ORDER — BRIMONIDINE TARTRATE 0.2 % OP SOLN: 1.0000 [drp] | Freq: Two times a day (BID) | OPHTHALMIC | Status: DC

## 2015-03-17 MED ORDER — INSULIN ASPART 100 UNIT/ML ~~LOC~~ SOLN: 0.0000 [IU] | Freq: Every day | SUBCUTANEOUS | Status: DC

## 2015-03-17 MED ORDER — BACITRACIN-POLYMYXIN B 500-10000 UNIT/GM OP OINT: 1.0000 "application " | TOPICAL_OINTMENT | Freq: Three times a day (TID) | OPHTHALMIC | Status: DC

## 2015-03-17 MED ORDER — INSULIN ASPART 100 UNIT/ML ~~LOC~~ SOLN
0.0000 [IU] | Freq: Three times a day (TID) | SUBCUTANEOUS | Status: DC
  Administered 2015-03-17: 3 [IU] via SUBCUTANEOUS

## 2015-03-17 MED ORDER — PREDNISOLONE ACETATE 1 % OP SUSP: 1.0000 [drp] | Freq: Four times a day (QID) | OPHTHALMIC | Status: DC

## 2015-03-17 MED ORDER — GATIFLOXACIN 0.5 % OP SOLN: 1.0000 [drp] | Freq: Four times a day (QID) | OPHTHALMIC | Status: DC

## 2015-03-17 NOTE — Discharge Summary (Signed)
Discharge summary not needed on [facility] patients per medical records. 

## 2015-03-17 NOTE — Progress Notes (Signed)
MD Zigmund Daniel called and updated on glucostablizer, ordered to give 20unit lantus early.  Crystal from house coverage also called.  She is working on putting sliding scale insulin in and instructed me to turn insulin drip off at 0700.  MD Zigmund Daniel to be here shortly. Graceann Congress

## 2015-03-17 NOTE — Progress Notes (Signed)
IV removed, AVS given to patient with understanding demonstrated, VS stable, Transportation arranged by patient and at the bedside.  Eye meds in bag given to patient with instructions, patient verbalized understanding of all instructions.

## 2015-03-17 NOTE — Progress Notes (Signed)
03/17/2015, 6:32 AM  Mental Status:  Awake, Alert, Oriented  Anterior segment: Cornea  Clear    Anterior Chamber Clear    Lens:    IOL  Intra Ocular Pressure 19 mmHg with Tonopen  Vitreous: Clear 80%gas bubble   Retina:  Attached Good laser reaction   Impression: Excellent result Retina attached   Final Diagnosis: Active Problems:   Rhegmatogenous retinal detachment of right eye   Plan: start post operative eye drops.  Discharge to home.  Give post operative instructions  Hayden Pedro 03/17/2015, 6:32 AM

## 2015-03-22 ENCOUNTER — Inpatient Hospital Stay (INDEPENDENT_AMBULATORY_CARE_PROVIDER_SITE_OTHER): Payer: Medicare Other | Admitting: Ophthalmology

## 2015-03-22 DIAGNOSIS — H338 Other retinal detachments: Secondary | ICD-10-CM

## 2015-04-12 ENCOUNTER — Encounter (INDEPENDENT_AMBULATORY_CARE_PROVIDER_SITE_OTHER): Payer: Medicare Other | Admitting: Ophthalmology

## 2015-04-12 DIAGNOSIS — H338 Other retinal detachments: Secondary | ICD-10-CM

## 2015-06-21 ENCOUNTER — Encounter (INDEPENDENT_AMBULATORY_CARE_PROVIDER_SITE_OTHER): Payer: Medicare Other | Admitting: Ophthalmology

## 2015-06-28 ENCOUNTER — Encounter (INDEPENDENT_AMBULATORY_CARE_PROVIDER_SITE_OTHER): Payer: Medicare Other | Admitting: Ophthalmology

## 2015-06-28 DIAGNOSIS — E113391 Type 2 diabetes mellitus with moderate nonproliferative diabetic retinopathy without macular edema, right eye: Secondary | ICD-10-CM

## 2015-06-28 DIAGNOSIS — E113292 Type 2 diabetes mellitus with mild nonproliferative diabetic retinopathy without macular edema, left eye: Secondary | ICD-10-CM

## 2015-06-28 DIAGNOSIS — E11319 Type 2 diabetes mellitus with unspecified diabetic retinopathy without macular edema: Secondary | ICD-10-CM | POA: Diagnosis not present

## 2015-06-28 DIAGNOSIS — H33302 Unspecified retinal break, left eye: Secondary | ICD-10-CM | POA: Diagnosis not present

## 2015-06-28 DIAGNOSIS — H43812 Vitreous degeneration, left eye: Secondary | ICD-10-CM | POA: Diagnosis not present

## 2015-06-28 DIAGNOSIS — I10 Essential (primary) hypertension: Secondary | ICD-10-CM

## 2015-06-28 DIAGNOSIS — H35033 Hypertensive retinopathy, bilateral: Secondary | ICD-10-CM

## 2015-06-28 DIAGNOSIS — H338 Other retinal detachments: Secondary | ICD-10-CM | POA: Diagnosis not present

## 2015-06-28 DIAGNOSIS — H35372 Puckering of macula, left eye: Secondary | ICD-10-CM | POA: Diagnosis not present

## 2015-10-11 ENCOUNTER — Other Ambulatory Visit: Payer: Self-pay

## 2015-10-11 DIAGNOSIS — Z1231 Encounter for screening mammogram for malignant neoplasm of breast: Secondary | ICD-10-CM

## 2015-11-26 ENCOUNTER — Other Ambulatory Visit: Payer: Self-pay | Admitting: Family Medicine

## 2015-11-26 ENCOUNTER — Other Ambulatory Visit: Payer: Self-pay

## 2015-11-26 DIAGNOSIS — N63 Unspecified lump in unspecified breast: Secondary | ICD-10-CM

## 2015-12-03 ENCOUNTER — Other Ambulatory Visit: Payer: Self-pay | Admitting: Family Medicine

## 2015-12-03 ENCOUNTER — Ambulatory Visit: Payer: Medicare Other

## 2015-12-03 ENCOUNTER — Ambulatory Visit
Admission: RE | Admit: 2015-12-03 | Discharge: 2015-12-03 | Disposition: A | Discharge status: Home / Self Care | Payer: Medicare Other | Source: Ambulatory Visit | Attending: Family Medicine | Admitting: Family Medicine

## 2015-12-03 DIAGNOSIS — N63 Unspecified lump in unspecified breast: Secondary | ICD-10-CM

## 2015-12-08 ENCOUNTER — Ambulatory Visit
Admission: RE | Admit: 2015-12-08 | Discharge: 2015-12-08 | Disposition: A | Discharge status: Home / Self Care | Payer: Medicare Other | Source: Ambulatory Visit | Attending: Family Medicine | Admitting: Family Medicine

## 2015-12-08 ENCOUNTER — Inpatient Hospital Stay: Admission: RE | Admit: 2015-12-08 | Payer: Medicare Other | Source: Ambulatory Visit

## 2015-12-08 DIAGNOSIS — N63 Unspecified lump in unspecified breast: Secondary | ICD-10-CM

## 2015-12-31 ENCOUNTER — Other Ambulatory Visit: Payer: Self-pay | Admitting: Surgery

## 2015-12-31 DIAGNOSIS — N6310 Unspecified lump in the right breast, unspecified quadrant: Secondary | ICD-10-CM

## 2016-01-10 ENCOUNTER — Other Ambulatory Visit: Payer: Self-pay | Admitting: Surgery

## 2016-01-10 DIAGNOSIS — N6310 Unspecified lump in the right breast, unspecified quadrant: Secondary | ICD-10-CM

## 2016-01-14 ENCOUNTER — Encounter (HOSPITAL_BASED_OUTPATIENT_CLINIC_OR_DEPARTMENT_OTHER): Payer: Self-pay | Admitting: *Deleted

## 2016-01-18 ENCOUNTER — Other Ambulatory Visit: Payer: Self-pay

## 2016-01-18 ENCOUNTER — Encounter (HOSPITAL_BASED_OUTPATIENT_CLINIC_OR_DEPARTMENT_OTHER)
Admission: RE | Admit: 2016-01-18 | Discharge: 2016-01-18 | Disposition: A | Discharge status: Home / Self Care | Payer: Medicare Other | Source: Ambulatory Visit | Attending: Surgery | Admitting: Surgery

## 2016-01-18 DIAGNOSIS — I10 Essential (primary) hypertension: Secondary | ICD-10-CM | POA: Diagnosis present

## 2016-01-18 DIAGNOSIS — E119 Type 2 diabetes mellitus without complications: Secondary | ICD-10-CM | POA: Insufficient documentation

## 2016-01-18 NOTE — Progress Notes (Signed)
Pt given 8oz bottle of water with written and verbal instructions (teach back) to drink 1100 morning of surgery, and no other liquids pass midnight.  Pt voiced understanding

## 2016-01-18 NOTE — Progress Notes (Signed)
ekg evaluated by dr Maxcine Ham no further treatment need

## 2016-01-20 ENCOUNTER — Ambulatory Visit
Admission: RE | Admit: 2016-01-20 | Discharge: 2016-01-20 | Disposition: A | Discharge status: Home / Self Care | Payer: Medicare Other | Source: Ambulatory Visit | Attending: Surgery | Admitting: Surgery

## 2016-01-20 DIAGNOSIS — N6310 Unspecified lump in the right breast, unspecified quadrant: Secondary | ICD-10-CM

## 2016-01-23 NOTE — H&P (Signed)
Sabrina Baker 12/31/2015 9:22 AM Location: [facility] Patient #: [id number] DOB: [birth year] Single / Language: Cleophus Molt / Race: White Female   History of Present Illness (Liyat Faulkenberry A. Ninfa Linden MD; 12/31/2015 9:42 AM) The patient is a 75 year old female who presents with a breast mass. This is a patient who is referred by Dr. Marin Olp at the breast center for evaluation of the right breast. Her primary care physician actually noticed a fullness in the right breast. Ultrasound showed a slight abnormality of prostate for centimeters in size of the 11 o'clock position of the right breast. She has started a biopsy of this area. This showed fibrocystic changes. This was strongly felt to be discordant with the radiologist regarding the mammographic findings. Surgical removal of this area is recommended for histologic evaluation to rule out malignancy. She has had no previous problems regarding her breast. She is otherwise without complaints. She does have a history of diabetes. She has had multiple orthopedic procedures as well. She has had no issues with general anesthesia. She is otherwise without complaints.   Allergies (Sonya Bynum, CMA; 12/31/2015 9:23 AM) Ramipril *ANTIHYPERTENSIVES* Valsartan *ANTIHYPERTENSIVES* Niacin *VITAMINS*  Medication History (Sonya Bynum, CMA; 12/31/2015 9:24 AM) Alendronate Sodium (70MG  Tablet, Oral) Active. Allopurinol (300MG  Tablet, Oral) Active. AmLODIPine Besylate (5MG  Tablet, Oral) Active. Atorvastatin Calcium (10MG  Tablet, Oral) Active. Carvedilol (25MG  Tablet, Oral) Active. DULoxetine HCl (30MG  Capsule DR Part, Oral) Active. Furosemide (20MG  Tablet, Oral) Active. Latanoprost (0.005% Solution, Ophthalmic) Active. Colchicine (0.6MG  Tablet, Oral) Active. Triamcinolone Acetonide (0.1% Cream, External) Active. Pantoprazole Sodium (40MG  Tablet DR, Oral) Active. Pioglitazone HCl (15MG  Tablet, Oral) Active. AcetaZOLAMIDE  (250MG  Tablet, Oral) Active. OneTouch Verio (In Vitro) Active. Trulicity (1.5MG /0.5ML Soln Pen-inj, Subcutaneous) Active. Medications Reconciled    Review of Systems Malachi Bonds CMA; 12/31/2015 9:31 AM) General Not Present- Appetite Loss, Chills, Fatigue, Fever, Night Sweats, Weight Gain and Weight Loss. Skin Present- Rash. Not Present- Change in Wart/Mole, Dryness, Hives, Jaundice, New Lesions, Non-Healing Wounds and Ulcer. HEENT Present- Visual Disturbances. Not Present- Earache, Hearing Loss, Hoarseness, Nose Bleed, Oral Ulcers, Ringing in the Ears, Seasonal Allergies, Sinus Pain, Sore Throat, Wears glasses/contact lenses and Yellow Eyes. Respiratory Not Present- Bloody sputum, Chronic Cough, Difficulty Breathing, Snoring and Wheezing. Breast Present- Breast Mass. Not Present- Breast Pain, Nipple Discharge and Skin Changes. Cardiovascular Not Present- Chest Pain, Difficulty Breathing Lying Down, Leg Cramps, Palpitations, Rapid Heart Rate, Shortness of Breath and Swelling of Extremities. Gastrointestinal Present- Abdominal Pain, Bloating and Change in Bowel Habits. Not Present- Bloody Stool, Chronic diarrhea, Constipation, Difficulty Swallowing, Excessive gas, Gets full quickly at meals, Hemorrhoids, Indigestion, Nausea, Rectal Pain and Vomiting. Female Genitourinary Not Present- Frequency, Nocturia, Painful Urination, Pelvic Pain and Urgency. Musculoskeletal Present- Joint Pain. Not Present- Back Pain, Joint Stiffness, Muscle Pain, Muscle Weakness and Swelling of Extremities. Psychiatric Present- Depression. Not Present- Anxiety, Bipolar, Change in Sleep Pattern, Fearful and Frequent crying. Endocrine Not Present- Cold Intolerance, Excessive Hunger, Hair Changes, Heat Intolerance, Hot flashes and New Diabetes. Hematology Present- Easy Bruising. Not Present- Blood Thinners, Excessive bleeding, Gland problems, HIV and Persistent Infections.  Vitals (Sonya Bynum CMA; 12/31/2015 9:22  AM) 12/31/2015 9:22 AM Weight: 147 lb Height: 63in Body Surface Area: 1.7 m Body Mass Index: 26.04 kg/m  Temp.: 98.51F(Temporal)  Pulse: 78 (Regular)  BP: 126/76 (Sitting, Left Arm, Standard)       Physical Exam (Clarion Mooneyhan A. Ninfa Linden MD; 12/31/2015 9:43 AM) General Mental Status-Alert. General Appearance-Consistent with stated age. Hydration-Well hydrated. Voice-Normal.  Head and Neck  Head-normocephalic, atraumatic with no lesions or palpable masses. Trachea-midline. Thyroid Gland Characteristics - normal size and consistency.  Eye Eyeball - Bilateral-Extraocular movements intact. Sclera/Conjunctiva - Bilateral-No scleral icterus.  Chest and Lung Exam Chest and lung exam reveals -quiet, even and easy respiratory effort with no use of accessory muscles and on auscultation, normal breath sounds, no adventitious sounds and normal vocal resonance. Inspection Chest Wall - Normal. Back - normal.  Breast Breast - Left-Symmetric, Non Tender, No Biopsy scars, no Dimpling, No Inflammation, No Lumpectomy scars, No Mastectomy scars, No Peau d' Orange. Breast - Right-Symmetric, Non Tender, No Biopsy scars, no Dimpling, No Inflammation, No Lumpectomy scars, No Mastectomy scars, No Peau d' Orange. Breast Lump-No Palpable Breast Mass. Note: There is a vague fullness in the upper outer quadrant of the right breast underneath the biopsy scar. There is no axillary adenopathy on either side.   Cardiovascular Cardiovascular examination reveals -normal heart sounds, regular rate and rhythm with no murmurs and normal pedal pulses bilaterally.  Abdomen Inspection Inspection of the abdomen reveals - No Hernias. Skin - Scar - no surgical scars. Palpation/Percussion Palpation and Percussion of the abdomen reveal - Soft, Non Tender, No Rebound tenderness, No Rigidity (guarding) and No hepatosplenomegaly. Auscultation Auscultation of the abdomen reveals -  Bowel sounds normal.  Neurologic Neurologic evaluation reveals -alert and oriented x 3 with no impairment of recent or remote memory. Mental Status-Normal.  Musculoskeletal - Did not examine.  Lymphatic Head & Neck  General Head & Neck Lymphatics: Bilateral - Description - Normal. Axillary  General Axillary Region: Bilateral - Description - Normal. Tenderness - Non Tender. Femoral & Inguinal - Did not examine.    Assessment & Plan (Belanna Manring A. Ninfa Linden MD; 12/31/2015 9:44 AM) BREAST MASS, RIGHT (N63) Impression: This is a right breast mass with discordant findings on recent stereotactic guided biopsy. A radioactive seed right breast lumpectomy is strongly recommended to rule out malignancy. I discussed the risk of surgery with her in detail. These include but are not limited to bleeding, infection, injury to surrounding structures, cardiopulmonary issues, need for further surgery malignancy is present, etc. She understands and wished to proceed with surgery will be scheduled

## 2016-01-24 ENCOUNTER — Encounter (HOSPITAL_BASED_OUTPATIENT_CLINIC_OR_DEPARTMENT_OTHER): Payer: Self-pay | Admitting: *Deleted

## 2016-01-24 ENCOUNTER — Ambulatory Visit (HOSPITAL_BASED_OUTPATIENT_CLINIC_OR_DEPARTMENT_OTHER): Payer: Medicare Other | Admitting: Anesthesiology

## 2016-01-24 ENCOUNTER — Ambulatory Visit
Admission: RE | Admit: 2016-01-24 | Discharge: 2016-01-24 | Disposition: A | Discharge status: Home / Self Care | Payer: Medicare Other | Source: Ambulatory Visit | Attending: Surgery | Admitting: Surgery

## 2016-01-24 ENCOUNTER — Encounter (HOSPITAL_BASED_OUTPATIENT_CLINIC_OR_DEPARTMENT_OTHER)
Admission: RE | Disposition: A | Discharge status: Home / Self Care | Payer: Self-pay | Source: Ambulatory Visit | Attending: Surgery

## 2016-01-24 ENCOUNTER — Ambulatory Visit (HOSPITAL_BASED_OUTPATIENT_CLINIC_OR_DEPARTMENT_OTHER)
Admission: RE | Admit: 2016-01-24 | Discharge: 2016-01-24 | Disposition: A | Discharge status: Home / Self Care | Payer: Medicare Other | Source: Ambulatory Visit | Attending: Surgery | Admitting: Surgery

## 2016-01-24 DIAGNOSIS — I10 Essential (primary) hypertension: Secondary | ICD-10-CM | POA: Diagnosis not present

## 2016-01-24 DIAGNOSIS — N63 Unspecified lump in breast: Secondary | ICD-10-CM | POA: Diagnosis present

## 2016-01-24 DIAGNOSIS — Z79899 Other long term (current) drug therapy: Secondary | ICD-10-CM | POA: Insufficient documentation

## 2016-01-24 DIAGNOSIS — E119 Type 2 diabetes mellitus without complications: Secondary | ICD-10-CM | POA: Insufficient documentation

## 2016-01-24 DIAGNOSIS — Z7984 Long term (current) use of oral hypoglycemic drugs: Secondary | ICD-10-CM | POA: Diagnosis not present

## 2016-01-24 DIAGNOSIS — N6310 Unspecified lump in the right breast, unspecified quadrant: Secondary | ICD-10-CM

## 2016-01-24 DIAGNOSIS — N6031 Fibrosclerosis of right breast: Secondary | ICD-10-CM | POA: Insufficient documentation

## 2016-01-24 HISTORY — PX: BREAST LUMPECTOMY WITH RADIOACTIVE SEED LOCALIZATION: SHX6424

## 2016-01-24 LAB — POCT I-STAT, CHEM 8
BUN: 23 mg/dL — AB (ref 6–20)
CHLORIDE: 107 mmol/L (ref 101–111)
Calcium, Ion: 1.17 mmol/L (ref 1.12–1.23)
Creatinine, Ser: 1.5 mg/dL — ABNORMAL HIGH (ref 0.44–1.00)
Glucose, Bld: 78 mg/dL (ref 65–99)
HEMATOCRIT: 33 % — AB (ref 36.0–46.0)
Hemoglobin: 11.2 g/dL — ABNORMAL LOW (ref 12.0–15.0)
Potassium: 4.6 mmol/L (ref 3.5–5.1)
SODIUM: 142 mmol/L (ref 135–145)
TCO2: 22 mmol/L (ref 0–100)

## 2016-01-24 LAB — GLUCOSE, CAPILLARY: Glucose-Capillary: 91 mg/dL (ref 65–99)

## 2016-01-24 SURGERY — BREAST LUMPECTOMY WITH RADIOACTIVE SEED LOCALIZATION
Anesthesia: General | Site: Breast | Laterality: Right

## 2016-01-24 MED ORDER — LIDOCAINE 2% (20 MG/ML) 5 ML SYRINGE
INTRAMUSCULAR | Status: AC
  Filled 2016-01-24: qty 5

## 2016-01-24 MED ORDER — CEFAZOLIN SODIUM-DEXTROSE 2-4 GM/100ML-% IV SOLN
INTRAVENOUS | Status: AC
  Filled 2016-01-24: qty 100

## 2016-01-24 MED ORDER — LIDOCAINE HCL (CARDIAC) 20 MG/ML IV SOLN
INTRAVENOUS | Status: DC | PRN
  Administered 2016-01-24: 80 mg via INTRAVENOUS

## 2016-01-24 MED ORDER — GLYCOPYRROLATE 0.2 MG/ML IJ SOLN: 0.2000 mg | Freq: Once | INTRAMUSCULAR | Status: DC | PRN

## 2016-01-24 MED ORDER — LACTATED RINGERS IV SOLN
INTRAVENOUS | Status: DC
  Administered 2016-01-24: 14:00:00 via INTRAVENOUS

## 2016-01-24 MED ORDER — FENTANYL CITRATE (PF) 100 MCG/2ML IJ SOLN: 25.0000 ug | INTRAMUSCULAR | Status: DC | PRN

## 2016-01-24 MED ORDER — FENTANYL CITRATE (PF) 100 MCG/2ML IJ SOLN
INTRAMUSCULAR | Status: AC
  Filled 2016-01-24: qty 2

## 2016-01-24 MED ORDER — CEFAZOLIN SODIUM-DEXTROSE 2-4 GM/100ML-% IV SOLN
2.0000 g | INTRAVENOUS | Status: AC
  Administered 2016-01-24: 2 g via INTRAVENOUS

## 2016-01-24 MED ORDER — BUPIVACAINE-EPINEPHRINE 0.5% -1:200000 IJ SOLN
INTRAMUSCULAR | Status: DC | PRN
  Administered 2016-01-24: 20 mL

## 2016-01-24 MED ORDER — DEXAMETHASONE SODIUM PHOSPHATE 10 MG/ML IJ SOLN
INTRAMUSCULAR | Status: AC
  Filled 2016-01-24: qty 1

## 2016-01-24 MED ORDER — ONDANSETRON HCL 4 MG/2ML IJ SOLN
INTRAMUSCULAR | Status: DC | PRN
  Administered 2016-01-24: 4 mg via INTRAVENOUS

## 2016-01-24 MED ORDER — HYDROCODONE-ACETAMINOPHEN 5-325 MG PO TABS: 1.0000 | ORAL_TABLET | Freq: Four times a day (QID) | ORAL | 0 refills | Status: DC | PRN

## 2016-01-24 MED ORDER — DEXAMETHASONE SODIUM PHOSPHATE 4 MG/ML IJ SOLN
INTRAMUSCULAR | Status: DC | PRN
  Administered 2016-01-24: 10 mg via INTRAVENOUS

## 2016-01-24 MED ORDER — MEPERIDINE HCL 25 MG/ML IJ SOLN: 6.2500 mg | INTRAMUSCULAR | Status: DC | PRN

## 2016-01-24 MED ORDER — CHLORHEXIDINE GLUCONATE CLOTH 2 % EX PADS: 6.0000 | MEDICATED_PAD | Freq: Once | CUTANEOUS | Status: DC

## 2016-01-24 MED ORDER — FENTANYL CITRATE (PF) 100 MCG/2ML IJ SOLN
50.0000 ug | INTRAMUSCULAR | Status: DC | PRN
Start: 2016-01-24 — End: 2016-01-24
  Administered 2016-01-24: 100 ug via INTRAVENOUS

## 2016-01-24 MED ORDER — ONDANSETRON HCL 4 MG/2ML IJ SOLN
INTRAMUSCULAR | Status: AC
  Filled 2016-01-24: qty 2

## 2016-01-24 MED ORDER — PROPOFOL 10 MG/ML IV BOLUS
INTRAVENOUS | Status: DC | PRN
  Administered 2016-01-24: 200 mg via INTRAVENOUS

## 2016-01-24 MED ORDER — EPHEDRINE SULFATE 50 MG/ML IJ SOLN
INTRAMUSCULAR | Status: DC | PRN
  Administered 2016-01-24: 10 mg via INTRAVENOUS
  Administered 2016-01-24: 15 mg via INTRAVENOUS
  Administered 2016-01-24: 10 mg via INTRAVENOUS

## 2016-01-24 MED ORDER — SCOPOLAMINE 1 MG/3DAYS TD PT72
1.0000 | MEDICATED_PATCH | Freq: Once | TRANSDERMAL | Status: DC | PRN
Start: 2016-01-24 — End: 2016-01-24

## 2016-01-24 MED ORDER — PROPOFOL 10 MG/ML IV BOLUS
INTRAVENOUS | Status: AC
  Filled 2016-01-24: qty 20

## 2016-01-24 MED ORDER — CHLORHEXIDINE GLUCONATE CLOTH 2 % EX PADS
6.0000 | MEDICATED_PAD | Freq: Once | CUTANEOUS | Status: DC
Start: 2016-01-24 — End: 2016-01-24

## 2016-01-24 MED ORDER — EPHEDRINE SULFATE 50 MG/ML IJ SOLN
INTRAMUSCULAR | Status: AC
  Filled 2016-01-24: qty 1

## 2016-01-24 MED ORDER — MIDAZOLAM HCL 2 MG/2ML IJ SOLN: 1.0000 mg | INTRAMUSCULAR | Status: DC | PRN

## 2016-01-24 SURGICAL SUPPLY — 47 items
APPLIER CLIP 9.375 MED OPEN (MISCELLANEOUS)
APR CLP MED 9.3 20 MLT OPN (MISCELLANEOUS)
BLADE HEX COATED 2.75 (ELECTRODE) ×2 IMPLANT
BLADE SURG 15 STRL LF DISP TIS (BLADE) ×1 IMPLANT
BLADE SURG 15 STRL SS (BLADE) ×2
CANISTER SUC SOCK COL 7IN (MISCELLANEOUS) IMPLANT
CANISTER SUCT 1200ML W/VALVE (MISCELLANEOUS) IMPLANT
CHLORAPREP W/TINT 26ML (MISCELLANEOUS) ×2 IMPLANT
CLIP APPLIE 9.375 MED OPEN (MISCELLANEOUS) IMPLANT
CLIP TI WIDE RED SMALL 6 (CLIP) ×2 IMPLANT
COVER BACK TABLE 60X90IN (DRAPES) ×2 IMPLANT
COVER MAYO STAND STRL (DRAPES) ×2 IMPLANT
COVER PROBE W GEL 5X96 (DRAPES) ×2 IMPLANT
DECANTER SPIKE VIAL GLASS SM (MISCELLANEOUS) IMPLANT
DEVICE DUBIN W/COMP PLATE 8390 (MISCELLANEOUS) ×2 IMPLANT
DRAPE LAPAROSCOPIC ABDOMINAL (DRAPES) ×2 IMPLANT
DRAPE UTILITY XL STRL (DRAPES) ×2 IMPLANT
ELECT REM PT RETURN 9FT ADLT (ELECTROSURGICAL) ×2
ELECTRODE REM PT RTRN 9FT ADLT (ELECTROSURGICAL) ×1 IMPLANT
GLOVE BIOGEL PI IND STRL 7.0 (GLOVE) IMPLANT
GLOVE BIOGEL PI INDICATOR 7.0 (GLOVE) ×1
GLOVE ECLIPSE 6.5 STRL STRAW (GLOVE) ×1 IMPLANT
GLOVE EXAM NITRILE MD LF STRL (GLOVE) ×1 IMPLANT
GLOVE SURG SIGNA 7.5 PF LTX (GLOVE) ×2 IMPLANT
GOWN STRL REUS W/ TWL LRG LVL3 (GOWN DISPOSABLE) ×1 IMPLANT
GOWN STRL REUS W/ TWL XL LVL3 (GOWN DISPOSABLE) ×1 IMPLANT
GOWN STRL REUS W/TWL LRG LVL3 (GOWN DISPOSABLE) ×2
GOWN STRL REUS W/TWL XL LVL3 (GOWN DISPOSABLE) ×2
KIT MARKER MARGIN INK (KITS) ×2 IMPLANT
LIQUID BAND (GAUZE/BANDAGES/DRESSINGS) ×2 IMPLANT
NDL HYPO 25X1 1.5 SAFETY (NEEDLE) ×1 IMPLANT
NEEDLE HYPO 25X1 1.5 SAFETY (NEEDLE) ×2 IMPLANT
NS IRRIG 1000ML POUR BTL (IV SOLUTION) IMPLANT
PACK BASIN DAY SURGERY FS (CUSTOM PROCEDURE TRAY) ×2 IMPLANT
PENCIL BUTTON HOLSTER BLD 10FT (ELECTRODE) ×2 IMPLANT
SLEEVE SCD COMPRESS KNEE MED (MISCELLANEOUS) ×2 IMPLANT
SPONGE GAUZE 4X4 12PLY STER LF (GAUZE/BANDAGES/DRESSINGS) IMPLANT
SPONGE LAP 4X18 X RAY DECT (DISPOSABLE) ×2 IMPLANT
SUT MNCRL AB 4-0 PS2 18 (SUTURE) ×2 IMPLANT
SUT SILK 2 0 SH (SUTURE) IMPLANT
SUT VIC AB 3-0 SH 27 (SUTURE) ×2
SUT VIC AB 3-0 SH 27X BRD (SUTURE) ×1 IMPLANT
SYR CONTROL 10ML LL (SYRINGE) ×2 IMPLANT
TOWEL OR 17X24 6PK STRL BLUE (TOWEL DISPOSABLE) ×2 IMPLANT
TOWEL OR NON WOVEN STRL DISP B (DISPOSABLE) IMPLANT
TUBE CONNECTING 20X1/4 (TUBING) IMPLANT
YANKAUER SUCT BULB TIP NO VENT (SUCTIONS) IMPLANT

## 2016-01-24 NOTE — Op Note (Signed)
RIGHT BREAST LUMPECTOMY WITH RADIOACTIVE SEED LOCALIZATION  Procedure Note  Sabrina Baker 01/24/2016   Pre-op Diagnosis: RIGHT BREAST MASS     Post-op Diagnosis: same  Procedure(s): RIGHT BREAST LUMPECTOMY WITH RADIOACTIVE SEED LOCALIZATION  Surgeon(s): Coralie Keens, MD  Anesthesia: General  Staff:  Circulator: Maurene Capes, RN Scrub Person: Silvio Clayman, CST  Estimated Blood Loss: Minimal               Specimens: sent to path  Indications: This is Baker 75 year old female with Baker right breast mass. There is Baker palpable fullness in the breast. 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 This was felt to be discordant therefore radioactive seed lumpectomy is been recommended.  Findings: Lumpectomy was performed and the previous biopsy clip and radioactive seed were located in the specimen on x-ray post removal  Procedure in detail: The patient was taken to the operating room and identified as the correct patient. She is placed supine on the operating room table and general anesthesia was induced. Her right breast was then prepped and draped in the usual sterile fashion. I identified an area of increased uptake with the neoprobe in the upper outer quadrant of the right breast. I anesthetized the skin over this area with Marcaine. I then made an incision with Baker scalpel.  I took this down into the breast tissue with the electrocautery. With the aid of the neoprobe, I did Baker wide lumpectomy around the radioactive seed. Once the lumpectomy specimen was removed, I confirmed with the neoprobe at the seed was in the lumpectomy specimen. I then marked all sides of the specimen with marker pain. X-ray was then performed on the lumpectomy specimen confirming that the seed and previous marker were in the specimen. The  specimen was then sent to pathology for evaluation. I then achieved hemostasis in the lumpectomy cavity with the cautery. I placed 2 small surgical clips in the cavity. I then closed the subcutaneous tissue with interrupted 3-0 Vicryl sutures and closed the skin with Baker running 4-0 Monocryl after anesthetizing the wound further with Marcaine. Skin glue and Baker binder with an applied. The patient tolerated the procedure well. All the counts were correct at the end of procedure. The patient was then extubated in the operating room and taken in Baker stable condition to the recovery room.          Sabrina Baker   Date: 01/24/2016  Time: 2:57 PM

## 2016-01-24 NOTE — Interval H&P Note (Signed)
History and Physical Interval Note:no change in H and P  01/24/2016 1:55 PM  Sabrina Baker  has presented today for surgery, with the diagnosis of RIGHT BREAST MASS  The various methods of treatment have been discussed with the patient and family. After consideration of risks, benefits and other options for treatment, the patient has consented to  Procedure(s): RIGHT BREAST LUMPECTOMY WITH RADIOACTIVE SEED LOCALIZATION (Right) as a surgical intervention .  The patient's history has been reviewed, patient examined, no change in status, stable for surgery.  I have reviewed the patient's chart and labs.  Questions were answered to the patient's satisfaction.     [name]

## 2016-01-24 NOTE — Discharge Instructions (Signed)
[facility] Office Phone Number [phone]  BREAST BIOPSY/ PARTIAL MASTECTOMY: POST OP INSTRUCTIONS  Always review your discharge instruction sheet given to you by the facility where your surgery was performed.  IF YOU HAVE DISABILITY OR FAMILY LEAVE FORMS, YOU MUST BRING THEM TO THE OFFICE FOR PROCESSING.  DO NOT GIVE THEM TO YOUR DOCTOR.  1. A prescription for pain medication may be given to you upon discharge.  Take your pain medication as prescribed, if needed.  If narcotic pain medicine is not needed, then you may take acetaminophen (Tylenol) or ibuprofen (Advil) as needed. 2. Take your usually prescribed medications unless otherwise directed 3. If you need a refill on your pain medication, please contact your pharmacy.  They will contact our office to request authorization.  Prescriptions will not be filled after 5pm or on week-ends. 4. You should eat very light the first 24 hours after surgery, such as soup, crackers, pudding, etc.  Resume your normal diet the day after surgery. 5. Most patients will experience some swelling and bruising in the breast.  Ice packs and a good support bra will help.  Swelling and bruising can take several days to resolve.  6. It is common to experience some constipation if taking pain medication after surgery.  Increasing fluid intake and taking a stool softener will usually help or prevent this problem from occurring.  A mild laxative (Milk of Magnesia or Miralax) should be taken according to package directions if there are no bowel movements after 48 hours. 7. Unless discharge instructions indicate otherwise, you may remove your bandages 24-48 hours after surgery, and you may shower at that time.  You may have steri-strips (small skin tapes) in place directly over the incision.  These strips should be left on the skin for 7-10 days.  If your surgeon used skin glue on the incision, you may shower in 24 hours.  The glue will flake off over the  next 2-3 weeks.  Any sutures or staples will be removed at the office during your follow-up visit. 8. ACTIVITIES:  You may resume regular daily activities (gradually increasing) beginning the next day.  Wearing a good support bra or sports bra minimizes pain and swelling.  You may have sexual intercourse when it is comfortable. a. You may drive when you no longer are taking prescription pain medication, you can comfortably wear a seatbelt, and you can safely maneuver your car and apply brakes. b. RETURN TO WORK:  ______________________________________________________________________________________ 9. You should see your doctor in the office for a follow-up appointment approximately two weeks after your surgery.  Your doctors nurse will typically make your follow-up appointment when she calls you with your pathology report.  Expect your pathology report 2-3 business days after your surgery.  You may call to check if you do not hear from [place] after three days. 10. OTHER INSTRUCTIONS: ____use binder as needed 11. Ice pack and ibuprofen also for pain 12. Ok to shower starting tomorrow___________________________________________________________________________________________ _____________________________________________________________________________________________________________________________________ _____________________________________________________________________________________________________________________________________ _____________________________________________________________________________________________________________________________________  [id number] TO CALL YOUR DOCTOR: 1. Fever over 101.0 2. Nausea and/or vomiting. 3. Extreme swelling or bruising. 4. Continued bleeding from incision. 5. Increased pain, redness, or drainage from the incision.  The clinic staff is available to answer your questions during regular business hours.  Please dont hesitate to call and ask to speak to  one of the nurses for clinical concerns.  If you have a medical emergency, go to the nearest emergency room or call 911.  A surgeon from [facility] is always on call at the hospital.  For further questions, please visit [email or web address]      Post Anesthesia Home Care Instructions  Activity: Get plenty of rest for the remainder of the day. A responsible adult should stay with you for 24 hours following the procedure.  For the next 24 hours, DO NOT: -Drive a car -Paediatric nurse -Drink alcoholic beverages -Take any medication unless instructed by your physician -Make any legal decisions or sign important papers.  Meals: Start with liquid foods such as gelatin or soup. Progress to regular foods as tolerated. Avoid greasy, spicy, heavy foods. If nausea and/or vomiting occur, drink only clear liquids until the nausea and/or vomiting subsides. Call your physician if vomiting continues.  Special Instructions/Symptoms: Your throat may feel dry or sore from the anesthesia or the breathing tube placed in your throat during surgery. If this causes discomfort, gargle with warm salt water. The discomfort should disappear within 24 hours.  If you had a scopolamine patch placed behind your ear for the management of post- operative nausea and/or vomiting:  1. The medication in the patch is effective for 72 hours, after which it should be removed.  Wrap patch in a tissue and discard in the trash. Wash hands thoroughly with soap and water. 2. You may remove the patch earlier than 72 hours if you experience unpleasant side effects which may include dry mouth, dizziness or visual disturbances. 3. Avoid touching the patch. Wash your hands with soap and water after contact with the patch.

## 2016-01-24 NOTE — Anesthesia Postprocedure Evaluation (Signed)
Anesthesia Post Note  Patient: Sabrina Baker  Procedure(s) Performed: Procedure(s) (LRB): RIGHT BREAST LUMPECTOMY WITH RADIOACTIVE SEED LOCALIZATION (Right)  Patient location during evaluation: PACU Anesthesia Type: General Level of consciousness: awake and alert Pain management: pain level controlled Vital Signs Assessment: post-procedure vital signs reviewed and stable Respiratory status: spontaneous breathing, nonlabored ventilation and respiratory function stable Cardiovascular status: blood pressure returned to baseline and stable Postop Assessment: no signs of nausea or vomiting Anesthetic complications: no    Last Vitals:  Vitals:   01/24/16 1515 01/24/16 1530  BP: 115/65 123/68  Pulse: 66 71  Resp: 10 17  Temp:      Last Pain:  Vitals:   01/24/16 1515  TempSrc:   PainSc: 0-No pain                 Deran Barro A

## 2016-01-24 NOTE — Anesthesia Procedure Notes (Signed)
Procedure Name: LMA Insertion Performed by: Glessie Eustice W Pre-anesthesia Checklist: Patient identified, Emergency Drugs available, Suction available and Patient being monitored Patient Re-evaluated:Patient Re-evaluated prior to inductionOxygen Delivery Method: Circle system utilized Preoxygenation: Pre-oxygenation with 100% oxygen Intubation Type: IV induction Ventilation: Mask ventilation without difficulty LMA: LMA inserted LMA Size: 4.0 Number of attempts: 1 Placement Confirmation: positive ETCO2 Tube secured with: Tape Dental Injury: Teeth and Oropharynx as per pre-operative assessment        

## 2016-01-24 NOTE — Anesthesia Preprocedure Evaluation (Signed)
Anesthesia Evaluation  Patient identified by MRN, date of birth, ID band Patient awake    Reviewed: Allergy & Precautions, NPO status , Patient's Chart, lab work & pertinent test results  Airway Mallampati: I  TM Distance: >3 FB Neck ROM: Full    Dental  (+) Lower Dentures, Teeth Intact, Dental Advisory Given   Pulmonary    breath sounds clear to auscultation       Cardiovascular hypertension,  Rhythm:Regular Rate:Normal     Neuro/Psych    GI/Hepatic   Endo/Other  diabetes, Well Controlled, Type 2, Oral Hypoglycemic Agents  Renal/GU      Musculoskeletal   Abdominal   Peds  Hematology   Anesthesia Other Findings   Reproductive/Obstetrics                             Anesthesia Physical Anesthesia Plan  ASA: III  Anesthesia Plan: General   Post-op Pain Management:    Induction: Intravenous  Airway Management Planned: LMA  Additional Equipment:   Intra-op Plan:   Post-operative Plan: Extubation in OR  Informed Consent: I have reviewed the patients History and Physical, chart, labs and discussed the procedure including the risks, benefits and alternatives for the proposed anesthesia with the patient or authorized representative who has indicated his/her understanding and acceptance.   Dental advisory given  Plan Discussed with: CRNA, Anesthesiologist and Surgeon  Anesthesia Plan Comments:         Anesthesia Quick Evaluation

## 2016-01-24 NOTE — Progress Notes (Signed)
Skin tear noted on right arm above elbow.  Patient stated it was not present before surgery.  Tegaderm drsg applied and advised patient and friend Jewel to leave on until she showers tomorrow and then leave open to air.

## 2016-01-24 NOTE — Transfer of Care (Signed)
Immediate Anesthesia Transfer of Care Note  Patient: Sabrina Baker  Procedure(s) Performed: Procedure(s): RIGHT BREAST LUMPECTOMY WITH RADIOACTIVE SEED LOCALIZATION (Right)  Patient Location: PACU  Anesthesia Type:General  Level of Consciousness: awake and sedated  Airway & Oxygen Therapy: Patient Spontanous Breathing and Patient connected to face mask oxygen  Post-op Assessment: Report given to RN and Post -op Vital signs reviewed and stable  Post vital signs: Reviewed and stable  Last Vitals:  Vitals:   01/24/16 1501 01/24/16 1502  BP: (!) 114/59   Pulse: 64 64  Resp:  15  Temp:      Last Pain:  Vitals:   01/24/16 1352  TempSrc: Oral         Complications: No apparent anesthesia complications

## 2016-01-25 ENCOUNTER — Encounter (HOSPITAL_BASED_OUTPATIENT_CLINIC_OR_DEPARTMENT_OTHER): Payer: Self-pay | Admitting: Surgery

## 2016-06-26 ENCOUNTER — Ambulatory Visit (INDEPENDENT_AMBULATORY_CARE_PROVIDER_SITE_OTHER): Payer: Medicare Other | Admitting: Ophthalmology

## 2016-07-07 ENCOUNTER — Emergency Department (HOSPITAL_COMMUNITY)
Admission: EM | Admit: 2016-07-07 | Discharge: 2016-07-07 | Disposition: A | Discharge status: Home / Self Care | Payer: Medicare Other | Attending: Emergency Medicine | Admitting: Emergency Medicine

## 2016-07-07 ENCOUNTER — Encounter (HOSPITAL_COMMUNITY): Payer: Self-pay | Admitting: Emergency Medicine

## 2016-07-07 DIAGNOSIS — Z7982 Long term (current) use of aspirin: Secondary | ICD-10-CM | POA: Diagnosis not present

## 2016-07-07 DIAGNOSIS — I129 Hypertensive chronic kidney disease with stage 1 through stage 4 chronic kidney disease, or unspecified chronic kidney disease: Secondary | ICD-10-CM | POA: Diagnosis not present

## 2016-07-07 DIAGNOSIS — Z96632 Presence of left artificial wrist joint: Secondary | ICD-10-CM | POA: Diagnosis not present

## 2016-07-07 DIAGNOSIS — N183 Chronic kidney disease, stage 3 (moderate): Secondary | ICD-10-CM | POA: Insufficient documentation

## 2016-07-07 DIAGNOSIS — R55 Syncope and collapse: Secondary | ICD-10-CM

## 2016-07-07 DIAGNOSIS — Z794 Long term (current) use of insulin: Secondary | ICD-10-CM | POA: Diagnosis not present

## 2016-07-07 DIAGNOSIS — Z8673 Personal history of transient ischemic attack (TIA), and cerebral infarction without residual deficits: Secondary | ICD-10-CM | POA: Insufficient documentation

## 2016-07-07 DIAGNOSIS — E1140 Type 2 diabetes mellitus with diabetic neuropathy, unspecified: Secondary | ICD-10-CM | POA: Diagnosis not present

## 2016-07-07 DIAGNOSIS — E1122 Type 2 diabetes mellitus with diabetic chronic kidney disease: Secondary | ICD-10-CM | POA: Insufficient documentation

## 2016-07-07 LAB — BASIC METABOLIC PANEL
Anion gap: 9 (ref 5–15)
BUN: 38 mg/dL — ABNORMAL HIGH (ref 6–20)
CO2: 25 mmol/L (ref 22–32)
Calcium: 8.7 mg/dL — ABNORMAL LOW (ref 8.9–10.3)
Chloride: 105 mmol/L (ref 101–111)
Creatinine, Ser: 1.99 mg/dL — ABNORMAL HIGH (ref 0.44–1.00)
GFR calc Af Amer: 27 mL/min — ABNORMAL LOW (ref >60)
GFR calc non Af Amer: 23 mL/min — ABNORMAL LOW (ref >60)
Glucose, Bld: 162 mg/dL — ABNORMAL HIGH (ref 65–99)
Potassium: 4.1 mmol/L (ref 3.5–5.1)
Sodium: 139 mmol/L (ref 135–145)

## 2016-07-07 LAB — CBC WITH DIFFERENTIAL/PLATELET
Basophils Absolute: 0 10*3/uL (ref 0.0–0.1)
Basophils Relative: 0 %
Eosinophils Absolute: 0 10*3/uL (ref 0.0–0.7)
Eosinophils Relative: 0 %
HCT: 37.1 % (ref 36.0–46.0)
Hemoglobin: 11.6 g/dL — ABNORMAL LOW (ref 12.0–15.0)
Lymphocytes Relative: 5 %
Lymphs Abs: 0.6 10*3/uL — ABNORMAL LOW (ref 0.7–4.0)
MCH: 31.1 pg (ref 26.0–34.0)
MCHC: 31.3 g/dL (ref 30.0–36.0)
MCV: 99.5 fL (ref 78.0–100.0)
Monocytes Absolute: 0.4 10*3/uL (ref 0.1–1.0)
Monocytes Relative: 3 %
Neutro Abs: 12.2 10*3/uL — ABNORMAL HIGH (ref 1.7–7.7)
Neutrophils Relative %: 92 %
Platelets: 308 10*3/uL (ref 150–400)
RBC: 3.73 MIL/uL — ABNORMAL LOW (ref 3.87–5.11)
RDW: 14.8 % (ref 11.5–15.5)
WBC: 13.3 10*3/uL — ABNORMAL HIGH (ref 4.0–10.5)

## 2016-07-07 MED ORDER — SODIUM CHLORIDE 0.9 % IV BOLUS (SEPSIS)
1000.0000 mL | Freq: Once | INTRAVENOUS | Status: AC
  Administered 2016-07-07: 1000 mL via INTRAVENOUS

## 2016-07-07 NOTE — ED Triage Notes (Signed)
Pt had blood drawn and was fasting for that , pt did not donate blood

## 2016-07-07 NOTE — ED Triage Notes (Signed)
Pt here from a restaurant after she passed out during breakfast , pt also gave blood this am and has had several loose stools

## 2016-07-07 NOTE — ED Provider Notes (Signed)
[facility] DEPT Provider Note   CSN: [id number] Arrival date & time: 07/07/16  1042  By signing my name below, I, Sabrina Baker, attest that this documentation has been prepared under the direction and in the presence of [name], MD. Electronically Signed: Sonum Baker, Education administrator. 07/07/16. 11:42 AM.  History   Chief Complaint Chief Complaint  Patient presents with  . Loss of Consciousness    The history is provided by the patient. No language interpreter was used.    HPI Comments: Sabrina Baker is a 76 y.o. female with past medical history of DM who presents to the Emergency Department complaining of an episode of syncope that occurred earlier this morning. She reports having 3-4 vials of routine blood work this morning and then went to have breakfast at [employer]. She states she was standing at the checkout counter when she began to feel warm just prior to losing consciousness. She does not recall exact details but states the episode was maybe a few minutes and she fell backwards striking her head on the ground. She reports having diarrhea for the past 10 hours. She denies nausea, SOB, dizziness, lightheadedness, fever, blood in stool, abdominal pain.   Past Medical History:  Diagnosis Date  . Chronic kidney disease    stage 3  . Diabetes mellitus without complication ([place])   . Hypercholesteremia   . Hypertension   . Neuromuscular disorder (HCC)    neuropathy  . Osteopenia   . Retinal detachment   . TIA (transient ischemic attack)   . Vitamin B12 deficiency     Patient Active Problem List   Diagnosis Date Noted  . 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 Aspiration pneumonia ([place]) 04/17/2012  . Gastroenteritis 04/17/2012  . Dehydration 04/17/2012  . Diabetes mellitus ([place]) 04/17/2012  . HTN (hypertension) 04/17/2012  . TIA (transient ischemic attack) 04/17/2012    Past Surgical History:  Procedure Laterality Date  . [place]  VITRECTOMY WITH 20 GAUGE MVR PORT Right 03/16/2015   Procedure: 25 GAUGE PARS PLANA VITRECTOMY WITH 23 GAUGE MVR PORT;  Surgeon: Hayden Pedro, MD;  Location: [place];  Service: Ophthalmology;  Laterality: Right;  . ANKLE SURGERY    . BREAST LUMPECTOMY WITH RADIOACTIVE SEED LOCALIZATION Right 01/24/2016   Procedure: RIGHT BREAST LUMPECTOMY WITH RADIOACTIVE SEED LOCALIZATION;  Surgeon: Coralie Keens, MD;  Location: [place];  Service: General;  Laterality: Right;  . EYE SURGERY     detached retna X2  . HEEL SPUR SURGERY  2012  . INJECTION OF SILICONE OIL Right [id number]   Procedure: Extraction OF SILICONE OIL;  Surgeon: Hayden Pedro, MD;  Location: [place];  Service: Ophthalmology;  Laterality: Right;  . ORIF ANKLE FRACTURE Left 06/17/2014   Procedure: OPEN REDUCTION INTERNAL FIXATION (ORIF) LEFT BIMALLEOLAR ANKLE FRACTURE;  Surgeon: Marianna Payment, MD;  Location: [place];  Service: Orthopedics;  Laterality: Left;  . PHOTOCOAGULATION WITH LASER Right 03/16/2015   Procedure: PHOTOCOAGULATION WITH LASER;  Surgeon: Hayden Pedro, MD;  Location: [place];  Service: Ophthalmology;  Laterality: Right;  . SHOULDER ARTHROSCOPY Right    plates and screws  . VITRECTOMY Right 03/16/2015  . WRIST ARTHROPLASTY Left    plates and screws and bone graft    OB History    No data available       Home Medications    Prior to Admission medications   Medication Sig Start Date End Date Taking? Authorizing Provider  alendronate (FOSAMAX) 70 MG tablet Take 70 mg by mouth once a week. Take with a full glass of water on an empty stomach.    Historical Provider, MD  allopurinol (ZYLOPRIM) 300 MG tablet Take 300 mg by mouth daily as needed (gout).    Historical Provider, MD  amLODipine (NORVASC) 10 MG tablet Take 10 mg by mouth daily.    Historical Provider, MD  aspirin 325 MG EC tablet Take 325 mg by mouth daily.    Historical Provider, MD  atorvastatin (LIPITOR) 10 MG tablet Take 10 mg by  mouth daily.    Historical Provider, MD  bacitracin-polymyxin b (POLYSPORIN) ophthalmic ointment Place 1 application into the right eye 3 (three) times daily. apply to eye every 12 hours while awake 03/17/15   Hayden Pedro, MD  brimonidine (ALPHAGAN) 0.15 % ophthalmic solution Place 1 drop into the right eye 3 (three) times daily.  04/15/14   Historical Provider, MD  brimonidine (ALPHAGAN) 0.2 % ophthalmic solution Place 1 drop into the right eye 2 (two) times daily. 03/17/15   Hayden Pedro, MD  calcium-vitamin D (OSCAL WITH D) 500-200 MG-UNIT per tablet Take 1 tablet by mouth 2 (two) times daily.     Historical Provider, MD  carvedilol (COREG) 25 MG tablet Take 25 mg by mouth 2 (two) times daily with a meal.  05/04/14   Historical Provider, MD  cholecalciferol (VITAMIN D) 1000 UNITS tablet Take 1,000 Units by mouth daily.    Historical Provider, MD  colchicine 0.6 MG tablet Take 0.6 mg by mouth daily as needed (gout). Started 04/07/13    Historical Provider, MD  DULoxetine (CYMBALTA) 30 MG capsule Take 30 mg by mouth 2 (two) times daily.    Historical Provider, MD  Ferrous Sulfate (IRON) 325 (65 FE) MG TABS Take 1 tablet by mouth 3 (three) times daily.    Historical Provider, MD  furosemide (LASIX) 20 MG tablet Take 20 mg by mouth daily.    Historical Provider, MD  gatifloxacin (ZYMAXID) 0.5 % SOLN Place 1 drop into the right eye 4 (four) times daily. 03/17/15   Hayden Pedro, MD  HYDROcodone-acetaminophen (NORCO) 5-325 MG tablet Take 1-2 tablets by mouth every 6 (six) hours as needed for moderate pain. 01/24/16   Coralie Keens, MD  Insulin Glargine (TOUJEO SOLOSTAR [place]) Inject 20 Units into the skin every morning.    Historical Provider, MD  magnesium gluconate (MAGONATE) 500 MG tablet Take 500 mg by mouth 2 (two) times daily.    Historical Provider, MD  mupirocin ointment (BACTROBAN) 2 % Place 1 application into the nose 2 (two) times daily.    Historical Provider, MD  pantoprazole (PROTONIX)  40 MG tablet Take 40 mg by mouth 2 (two) times daily.    Historical Provider, MD  pioglitazone (ACTOS) 30 MG tablet Take 15 mg by mouth every evening.     Historical Provider, MD  Polyethyl Glycol-Propyl Glycol (SYSTANE OP) Place 1 drop into the right eye 4 (four) times daily.    Historical Provider, MD  prednisoLONE acetate (PRED FORTE) 1 % ophthalmic suspension Place 1 drop into the right eye 2 (two) times daily.    Historical Provider, MD  timolol (TIMOPTIC) 0.5 % ophthalmic solution Place 1 drop into the right eye 2 (two) times daily.  05/21/14   Historical Provider, MD  TRULICITY 1.5 0000000 SOPN Inject 1.5 mg into the skin once a week. mondays 05/28/14   Historical Provider, MD  vitamin B-12 (CYANOCOBALAMIN) 1000 MCG  tablet Take 1,000 mcg by mouth daily.    Historical Provider, MD    Family History History reviewed. No pertinent family history.  Social History Social History  Substance Use Topics  . Smoking status: Never Smoker  . Smokeless tobacco: Never Used  . Alcohol use No     Allergies   Altace [ramipril]; Diovan [valsartan]; and Niacin and related   Review of Systems Review of Systems A complete 10 system review of systems was obtained and all systems are negative except as noted in the HPI and PMH.    Physical Exam Updated Vital Signs BP 133/74 (BP Location: Right Arm)   Pulse 96   Temp 98.6 F (37 C)   Resp 21   SpO2 98%   Physical Exam  Constitutional: She is oriented to person, place, and time. She appears well-developed and well-nourished. No distress.  HENT:  Head: Normocephalic and atraumatic.  Eyes: EOM are normal.  Mild right eye ptosis   Neck: Normal range of motion.  Cardiovascular: Normal rate, regular rhythm and normal heart sounds.   Pulmonary/Chest: Effort normal and breath sounds normal.  Abdominal: Soft. She exhibits no distension. There is no tenderness.  Hyperactive bowel sounds  Musculoskeletal: Normal range of motion.  Neurological:  She is alert and oriented to person, place, and time.  Skin: Skin is warm and dry.  Psychiatric: She has a normal mood and affect. Judgment normal.  Nursing note and vitals reviewed.    ED Treatments / Results  DIAGNOSTIC STUDIES: Oxygen Saturation is 98% on RA, nml by my interpretation.    COORDINATION OF CARE: 11:54 AM Discussed treatment plan with pt at bedside and pt agreed to plan.   Labs (all labs ordered are listed, but only abnormal results are displayed) Labs Reviewed  CBC WITH DIFFERENTIAL/PLATELET - Abnormal; Notable for the following:       Result Value   WBC 13.3 (*)    RBC 3.73 (*)    Hemoglobin 11.6 (*)    Neutro Abs 12.2 (*)    Lymphs Abs 0.6 (*)    All other components within normal limits  BASIC METABOLIC PANEL - Abnormal; Notable for the following:    Glucose, Bld 162 (*)    BUN 38 (*)    Creatinine, Ser 1.99 (*)    Calcium 8.7 (*)    GFR calc non Af Amer 23 (*)    GFR calc Af Amer 27 (*)    All other components within normal limits    EKG  EKG Interpretation  Date/Time:  Friday July 07 2016 11:01:25 EST Ventricular Rate:  91 PR Interval:    QRS Duration: 97 QT Interval:  359 QTC Calculation: 442 R Axis:   -42 Text Interpretation:  Sinus rhythm Probable left atrial enlargement Left axis deviation Confirmed by Wilson Singer  MD, Journey Ratterman [id number]) on 07/07/2016 11:55:32 AM       Radiology No results found.  Procedures Procedures (including critical care time)  Medications Ordered in ED Medications - No data to display   Initial Impression / Assessment and Plan / ED Course  I have reviewed the triage vital signs and the nursing notes.  Pertinent labs & imaging results that were available during my care of the patient were reviewed by me and considered in my medical decision making (see chart for details).      Final Clinical Impressions(s) / ED Diagnoses   Final diagnoses:  Syncope, unspecified syncope type    New Prescriptions New  Prescriptions  No medications on file    I personally preformed the services scribed in my presence. The recorded information has been reviewed is accurate. [name], MD.    [name], MD 07/19/16 [id number]

## 2016-07-07 NOTE — ED Notes (Signed)
Pt being driven home by daughter. NAD. Pt ambulatory at DC.

## 2016-07-10 DIAGNOSIS — M8580 Other specified disorders of bone density and structure, unspecified site: Secondary | ICD-10-CM | POA: Insufficient documentation

## 2016-07-10 DIAGNOSIS — E782 Mixed hyperlipidemia: Secondary | ICD-10-CM | POA: Insufficient documentation

## 2016-07-10 DIAGNOSIS — E538 Deficiency of other specified B group vitamins: Secondary | ICD-10-CM | POA: Insufficient documentation

## 2016-08-22 ENCOUNTER — Ambulatory Visit (INDEPENDENT_AMBULATORY_CARE_PROVIDER_SITE_OTHER): Payer: Medicare Other | Admitting: Ophthalmology

## 2016-09-05 ENCOUNTER — Ambulatory Visit (INDEPENDENT_AMBULATORY_CARE_PROVIDER_SITE_OTHER): Payer: Medicare Other | Admitting: Ophthalmology

## 2016-09-05 DIAGNOSIS — H35372 Puckering of macula, left eye: Secondary | ICD-10-CM

## 2016-09-05 DIAGNOSIS — I10 Essential (primary) hypertension: Secondary | ICD-10-CM

## 2016-09-05 DIAGNOSIS — H43812 Vitreous degeneration, left eye: Secondary | ICD-10-CM

## 2016-09-05 DIAGNOSIS — E113393 Type 2 diabetes mellitus with moderate nonproliferative diabetic retinopathy without macular edema, bilateral: Secondary | ICD-10-CM | POA: Diagnosis not present

## 2016-09-05 DIAGNOSIS — E11319 Type 2 diabetes mellitus with unspecified diabetic retinopathy without macular edema: Secondary | ICD-10-CM | POA: Diagnosis not present

## 2016-09-05 DIAGNOSIS — H35033 Hypertensive retinopathy, bilateral: Secondary | ICD-10-CM

## 2016-09-05 DIAGNOSIS — H338 Other retinal detachments: Secondary | ICD-10-CM

## 2017-06-15 ENCOUNTER — Ambulatory Visit: Payer: Medicare Other | Admitting: Podiatry

## 2017-06-15 ENCOUNTER — Ambulatory Visit: Payer: Medicare Other | Admitting: Orthotics

## 2017-06-21 ENCOUNTER — Ambulatory Visit: Payer: Medicare Other | Admitting: Orthotics

## 2017-06-21 ENCOUNTER — Ambulatory Visit (INDEPENDENT_AMBULATORY_CARE_PROVIDER_SITE_OTHER): Payer: Medicare Other | Admitting: Podiatry

## 2017-06-21 VITALS — BP 196/98 | HR 65 | Ht 63.5 in | Wt 155.0 lb

## 2017-06-21 DIAGNOSIS — E1151 Type 2 diabetes mellitus with diabetic peripheral angiopathy without gangrene: Secondary | ICD-10-CM

## 2017-06-21 DIAGNOSIS — M2042 Other hammer toe(s) (acquired), left foot: Secondary | ICD-10-CM

## 2017-06-21 DIAGNOSIS — M2041 Other hammer toe(s) (acquired), right foot: Secondary | ICD-10-CM

## 2017-06-21 DIAGNOSIS — M2141 Flat foot [pes planus] (acquired), right foot: Secondary | ICD-10-CM

## 2017-06-21 DIAGNOSIS — M2142 Flat foot [pes planus] (acquired), left foot: Secondary | ICD-10-CM

## 2017-06-21 NOTE — Progress Notes (Signed)
   Subjective:    Patient ID: Sabrina Baker, female    DOB: January 12, 1941, 77 y.o.   MRN: [id number]  HPI  Chief Complaint  Patient presents with  . Diabetes    NP Diabetic foot exam, interested in diabetic shoes       Review of Systems  All other systems reviewed and are negative.      Objective:   Physical Exam        Assessment & Plan:

## 2017-07-19 ENCOUNTER — Ambulatory Visit: Payer: Medicare Other | Admitting: Orthotics

## 2017-07-19 DIAGNOSIS — M2041 Other hammer toe(s) (acquired), right foot: Secondary | ICD-10-CM

## 2017-07-19 DIAGNOSIS — E1151 Type 2 diabetes mellitus with diabetic peripheral angiopathy without gangrene: Secondary | ICD-10-CM

## 2017-07-19 DIAGNOSIS — M2141 Flat foot [pes planus] (acquired), right foot: Secondary | ICD-10-CM

## 2017-07-19 DIAGNOSIS — M2142 Flat foot [pes planus] (acquired), left foot: Secondary | ICD-10-CM

## 2017-07-19 DIAGNOSIS — M2042 Other hammer toe(s) (acquired), left foot: Secondary | ICD-10-CM

## 2017-08-03 ENCOUNTER — Ambulatory Visit (INDEPENDENT_AMBULATORY_CARE_PROVIDER_SITE_OTHER): Payer: Medicare Other | Admitting: Orthotics

## 2017-08-03 DIAGNOSIS — E1151 Type 2 diabetes mellitus with diabetic peripheral angiopathy without gangrene: Secondary | ICD-10-CM

## 2017-08-03 DIAGNOSIS — M2041 Other hammer toe(s) (acquired), right foot: Secondary | ICD-10-CM

## 2017-08-03 DIAGNOSIS — M2042 Other hammer toe(s) (acquired), left foot: Secondary | ICD-10-CM | POA: Diagnosis not present

## 2017-08-03 DIAGNOSIS — M2142 Flat foot [pes planus] (acquired), left foot: Secondary | ICD-10-CM

## 2017-08-03 DIAGNOSIS — M2141 Flat foot [pes planus] (acquired), right foot: Secondary | ICD-10-CM | POA: Diagnosis not present

## 2017-08-03 NOTE — Progress Notes (Signed)

## 2017-08-05 NOTE — Progress Notes (Signed)
dbs did not fit, returned for larger size.

## 2017-08-07 ENCOUNTER — Ambulatory Visit: Payer: Medicare Other | Admitting: Orthotics

## 2017-08-07 DIAGNOSIS — M2042 Other hammer toe(s) (acquired), left foot: Secondary | ICD-10-CM

## 2017-08-07 DIAGNOSIS — E1151 Type 2 diabetes mellitus with diabetic peripheral angiopathy without gangrene: Secondary | ICD-10-CM

## 2017-08-07 DIAGNOSIS — M2041 Other hammer toe(s) (acquired), right foot: Secondary | ICD-10-CM

## 2017-08-07 NOTE — Progress Notes (Signed)
Reordered shoe in 7W

## 2017-08-27 ENCOUNTER — Ambulatory Visit: Payer: Medicare Other | Admitting: Orthotics

## 2017-08-27 DIAGNOSIS — M2142 Flat foot [pes planus] (acquired), left foot: Secondary | ICD-10-CM

## 2017-08-27 DIAGNOSIS — E1151 Type 2 diabetes mellitus with diabetic peripheral angiopathy without gangrene: Secondary | ICD-10-CM

## 2017-08-27 DIAGNOSIS — M2141 Flat foot [pes planus] (acquired), right foot: Secondary | ICD-10-CM

## 2017-08-27 DIAGNOSIS — M2041 Other hammer toe(s) (acquired), right foot: Secondary | ICD-10-CM

## 2017-08-27 DIAGNOSIS — M2042 Other hammer toe(s) (acquired), left foot: Secondary | ICD-10-CM

## 2017-08-27 NOTE — Progress Notes (Signed)

## 2017-09-10 ENCOUNTER — Ambulatory Visit (INDEPENDENT_AMBULATORY_CARE_PROVIDER_SITE_OTHER): Payer: Medicare Other | Admitting: Ophthalmology

## 2017-09-10 DIAGNOSIS — H35372 Puckering of macula, left eye: Secondary | ICD-10-CM | POA: Diagnosis not present

## 2017-09-10 DIAGNOSIS — H338 Other retinal detachments: Secondary | ICD-10-CM

## 2017-09-10 DIAGNOSIS — H35033 Hypertensive retinopathy, bilateral: Secondary | ICD-10-CM

## 2017-09-10 DIAGNOSIS — E11319 Type 2 diabetes mellitus with unspecified diabetic retinopathy without macular edema: Secondary | ICD-10-CM

## 2017-09-10 DIAGNOSIS — I10 Essential (primary) hypertension: Secondary | ICD-10-CM

## 2017-09-10 DIAGNOSIS — H43812 Vitreous degeneration, left eye: Secondary | ICD-10-CM

## 2017-09-10 DIAGNOSIS — E113392 Type 2 diabetes mellitus with moderate nonproliferative diabetic retinopathy without macular edema, left eye: Secondary | ICD-10-CM | POA: Diagnosis not present

## 2017-09-10 DIAGNOSIS — E113591 Type 2 diabetes mellitus with proliferative diabetic retinopathy without macular edema, right eye: Secondary | ICD-10-CM | POA: Diagnosis not present

## 2017-11-13 ENCOUNTER — Telehealth: Payer: Self-pay | Admitting: Podiatry

## 2017-11-13 NOTE — Telephone Encounter (Signed)
Pt called and left a message states that diabetic inserts not working for pt. Had custom from [employer] from last year and they work fine. Pt states the ones we got make her legs hurt and her foot feels like it is going to roll.   I called pt and she stated she does not feel safe in wearing the inserts that she is going to hurt herself, roll her foot. She said Liliane Channel had mentioned making inserts for her but they never followed up on that. I did schedule her to see Liliane Channel on 6.18.19.

## 2017-11-27 ENCOUNTER — Ambulatory Visit (INDEPENDENT_AMBULATORY_CARE_PROVIDER_SITE_OTHER): Payer: Medicare Other | Admitting: Orthotics

## 2017-11-27 DIAGNOSIS — M2141 Flat foot [pes planus] (acquired), right foot: Secondary | ICD-10-CM

## 2017-11-27 DIAGNOSIS — M2042 Other hammer toe(s) (acquired), left foot: Secondary | ICD-10-CM

## 2017-11-27 DIAGNOSIS — E1151 Type 2 diabetes mellitus with diabetic peripheral angiopathy without gangrene: Secondary | ICD-10-CM

## 2017-11-27 DIAGNOSIS — M2142 Flat foot [pes planus] (acquired), left foot: Secondary | ICD-10-CM

## 2017-11-27 DIAGNOSIS — M2041 Other hammer toe(s) (acquired), right foot: Secondary | ICD-10-CM

## 2017-11-27 NOTE — Progress Notes (Signed)
Patient came in today for modifications of diabetic inserts; she actually wants me to make her a pair based upon previous positive model.

## 2017-12-17 ENCOUNTER — Other Ambulatory Visit: Payer: Self-pay | Admitting: Family Medicine

## 2017-12-17 DIAGNOSIS — N6320 Unspecified lump in the left breast, unspecified quadrant: Secondary | ICD-10-CM

## 2017-12-24 ENCOUNTER — Ambulatory Visit: Payer: Medicare Other | Admitting: Orthotics

## 2017-12-24 ENCOUNTER — Ambulatory Visit
Admission: RE | Admit: 2017-12-24 | Discharge: 2017-12-24 | Disposition: A | Discharge status: Home / Self Care | Payer: Medicare Other | Source: Ambulatory Visit | Attending: Family Medicine | Admitting: Family Medicine

## 2017-12-24 DIAGNOSIS — N6320 Unspecified lump in the left breast, unspecified quadrant: Secondary | ICD-10-CM

## 2017-12-24 DIAGNOSIS — E1151 Type 2 diabetes mellitus with diabetic peripheral angiopathy without gangrene: Secondary | ICD-10-CM

## 2017-12-24 DIAGNOSIS — M2141 Flat foot [pes planus] (acquired), right foot: Secondary | ICD-10-CM

## 2017-12-24 DIAGNOSIS — M2142 Flat foot [pes planus] (acquired), left foot: Secondary | ICD-10-CM

## 2017-12-24 DIAGNOSIS — M2042 Other hammer toe(s) (acquired), left foot: Secondary | ICD-10-CM

## 2017-12-24 DIAGNOSIS — M2041 Other hammer toe(s) (acquired), right foot: Secondary | ICD-10-CM

## 2017-12-24 NOTE — Progress Notes (Signed)
Patient picked up db inserts made from form supplied by patient.  She seems well pleased.

## 2017-12-25 ENCOUNTER — Other Ambulatory Visit: Payer: Self-pay | Admitting: Family Medicine

## 2017-12-25 DIAGNOSIS — M8580 Other specified disorders of bone density and structure, unspecified site: Secondary | ICD-10-CM

## 2017-12-28 ENCOUNTER — Ambulatory Visit (INDEPENDENT_AMBULATORY_CARE_PROVIDER_SITE_OTHER): Payer: Self-pay

## 2017-12-28 ENCOUNTER — Encounter (INDEPENDENT_AMBULATORY_CARE_PROVIDER_SITE_OTHER): Payer: Self-pay | Admitting: Orthopaedic Surgery

## 2017-12-28 ENCOUNTER — Ambulatory Visit (INDEPENDENT_AMBULATORY_CARE_PROVIDER_SITE_OTHER): Payer: Medicare Other | Admitting: Orthopaedic Surgery

## 2017-12-28 DIAGNOSIS — M25572 Pain in left ankle and joints of left foot: Secondary | ICD-10-CM

## 2017-12-28 DIAGNOSIS — M25511 Pain in right shoulder: Secondary | ICD-10-CM | POA: Diagnosis not present

## 2017-12-28 DIAGNOSIS — G8929 Other chronic pain: Secondary | ICD-10-CM

## 2017-12-28 DIAGNOSIS — M25532 Pain in left wrist: Secondary | ICD-10-CM | POA: Diagnosis not present

## 2017-12-28 DIAGNOSIS — M25571 Pain in right ankle and joints of right foot: Secondary | ICD-10-CM | POA: Diagnosis not present

## 2017-12-28 MED ORDER — HYDROCODONE-ACETAMINOPHEN 5-325 MG PO TABS: 1.0000 | ORAL_TABLET | Freq: Every day | ORAL | 0 refills | Status: DC | PRN

## 2017-12-28 NOTE — Progress Notes (Signed)
Office Visit Note   Patient: Sabrina Baker           Date of Birth: 16-Apr-1941           MRN: [id number] Visit Date: 12/28/2017              Requested by: [name], MD [place], [place] [id number] PCP: [name], MD   Assessment & Plan: Visit Diagnoses:  1. Chronic pain of both ankles   2. Chronic right shoulder pain   3. 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  I do not see any acute findings.  She has had a previous ORIF of her right proximal humerus which appears to be stable.  In terms of her ankles she has degenerative disease without any acute findings.  Recommend continued conservative treatment.  I did agree to give her a small supply of Norco but she understands that this will not be refilled.  I will see her back as needed.  Follow-Up Instructions: Return if symptoms worsen or fail to improve.   Orders:  Orders Placed This Encounter  Procedures  . XR Ankle Complete Left  . XR Ankle Complete Right  . XR Shoulder Right   Meds ordered this encounter  Medications  . HYDROcodone-acetaminophen (NORCO) 5-325 MG tablet    Sig: Take 1-2 tablets by mouth daily as needed.    Dispense:  10 tablet    Refill:  0      Procedures: No procedures performed   Clinical Data: No additional findings.   Subjective: Chief Complaint  Patient presents with  . Right Ankle - Pain  . Left Ankle - Pain     Sabrina Baker is a 77 year old female comes right shoulder pain and bilateral ankle pain wants to get things checked out.  She denies any injuries.  She endorses pain across the anterior aspect of her ankle and pain with elevation of her right arm.   Review of Systems  Constitutional: Negative.   HENT: Negative.   Eyes: Negative.   Respiratory: Negative.   Cardiovascular: Negative.   Endocrine: Negative.   Musculoskeletal: Negative.   Neurological: Negative.   Hematological:  Negative.   Psychiatric/Behavioral: Negative.   All other systems reviewed and are negative.    Objective: Vital Signs: There were no vitals taken for this visit.  Physical Exam  Constitutional: She is oriented to person, place, and time. She appears well-developed and well-nourished.  Pulmonary/Chest: Effort normal.  Neurological: She is alert and oriented to person, place, and time.  Skin: Skin is warm. Capillary refill takes less than 2 seconds.  Psychiatric: She has a normal mood and affect. Her behavior is normal. Judgment and thought content normal.  Nursing note and vitals reviewed.   Ortho Exam Right shoulder exam shows a fully healed surgical scar.  She has active elevation to just past the shoulder level.  She does have some grinding and crepitus with range of motion.  Bilateral ankle exam shows fully healed surgical scars.  No signs of infection or swelling. Specialty Comments:  No specialty comments available.  Imaging: Xr Ankle Complete Left  Result Date: 12/28/2017 Stable hardware fixation.  No complications.  Degenerative disease of the ankle joint.  Xr Ankle Complete Right  Result Date: 12/28/2017 Degenerative joint disease of the right ankle.  No acute abnormalities.  Xr Shoulder Right  Result Date: 12/28/2017 Status post ORIF  right proximal humerus fracture with osteonecrosis and resorption of humeral head.    PMFS History: Patient Active Problem List   Diagnosis Date Noted  . 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 Aspiration pneumonia ([place]) 04/17/2012  . Gastroenteritis 04/17/2012  . Dehydration 04/17/2012  . Diabetes mellitus ([place]) 04/17/2012  . HTN (hypertension) 04/17/2012  . TIA (transient ischemic attack) 04/17/2012   Past Medical History:  Diagnosis Date  . Chronic kidney disease    stage 3  . Diabetes mellitus without complication ([place])   . Hypercholesteremia   . Hypertension   . Neuromuscular disorder (HCC)     neuropathy  . Osteopenia   . Retinal detachment   . TIA (transient ischemic attack)   . Vitamin B12 deficiency     History reviewed. No pertinent family history.  Past Surgical History:  Procedure Laterality Date  . [place] VITRECTOMY WITH 20 GAUGE MVR PORT Right 03/16/2015   Procedure: 25 GAUGE PARS PLANA VITRECTOMY WITH 23 GAUGE MVR PORT;  Surgeon: Hayden Pedro, MD;  Location: [facility];  Service: Ophthalmology;  Laterality: Right;  . ANKLE SURGERY    . BREAST EXCISIONAL BIOPSY    . BREAST LUMPECTOMY WITH RADIOACTIVE SEED LOCALIZATION Right 01/24/2016   Procedure: RIGHT BREAST LUMPECTOMY WITH RADIOACTIVE SEED LOCALIZATION;  Surgeon: Coralie Keens, MD;  Location: [facility];  Service: General;  Laterality: Right;  . EYE SURGERY     detached retna X2  . HEEL SPUR SURGERY  2012  . INJECTION OF SILICONE OIL Right 25/01/5276   Procedure: Extraction OF SILICONE OIL;  Surgeon: Hayden Pedro, MD;  Location: [facility];  Service: Ophthalmology;  Laterality: Right;  . ORIF ANKLE FRACTURE Left 06/17/2014   Procedure: OPEN REDUCTION INTERNAL FIXATION (ORIF) LEFT BIMALLEOLAR ANKLE FRACTURE;  Surgeon: Marianna Payment, MD;  Location: [facility];  Service: Orthopedics;  Laterality: Left;  . PHOTOCOAGULATION WITH LASER Right 03/16/2015   Procedure: PHOTOCOAGULATION WITH LASER;  Surgeon: Hayden Pedro, MD;  Location: [facility];  Service: Ophthalmology;  Laterality: Right;  . SHOULDER ARTHROSCOPY Right    plates and screws  . VITRECTOMY Right 03/16/2015  . WRIST ARTHROPLASTY Left    plates and screws and bone graft   Social History   Occupational History  . Not on file  Tobacco Use  . Smoking status: Never Smoker  . Smokeless tobacco: Never Used  Substance and Sexual Activity  . Alcohol use: No  . Drug use: No  . Sexual activity: Never    Birth control/protection: Post-menopausal

## 2018-01-28 ENCOUNTER — Ambulatory Visit: Payer: Medicare Other | Admitting: Orthotics

## 2018-01-28 DIAGNOSIS — M2142 Flat foot [pes planus] (acquired), left foot: Secondary | ICD-10-CM

## 2018-01-28 DIAGNOSIS — M2042 Other hammer toe(s) (acquired), left foot: Secondary | ICD-10-CM

## 2018-01-28 DIAGNOSIS — E1151 Type 2 diabetes mellitus with diabetic peripheral angiopathy without gangrene: Secondary | ICD-10-CM

## 2018-01-28 DIAGNOSIS — M2041 Other hammer toe(s) (acquired), right foot: Secondary | ICD-10-CM

## 2018-01-28 DIAGNOSIS — M2141 Flat foot [pes planus] (acquired), right foot: Secondary | ICD-10-CM

## 2018-01-28 NOTE — Progress Notes (Signed)
Minor adjustment to f/o:  Added navicular pad.

## 2018-02-06 ENCOUNTER — Ambulatory Visit
Admission: RE | Admit: 2018-02-06 | Discharge: 2018-02-06 | Disposition: A | Discharge status: Home / Self Care | Payer: Medicare Other | Source: Ambulatory Visit | Attending: Family Medicine | Admitting: Family Medicine

## 2018-02-06 DIAGNOSIS — M8580 Other specified disorders of bone density and structure, unspecified site: Secondary | ICD-10-CM

## 2018-02-20 NOTE — Progress Notes (Signed)
Subjective:  Patient ID: Sabrina Baker, female    DOB: 05/15/1941,  MRN: [id number]  Chief Complaint  Patient presents with  . Diabetes    NP Diabetic foot exam, interested in diabetic shoes    77 y.o. female presents  for diabetic foot care. Last AMBS was unknown. Denies numbness and tingling in their feet. Denies cramping in legs and thighs.  Review of Systems: Negative except as noted in the HPI. Denies N/V/F/Ch.  Past Medical History:  Diagnosis Date  . Chronic kidney disease    stage 3  . Diabetes mellitus without complication ([place])   . Hypercholesteremia   . Hypertension   . Neuromuscular disorder (HCC)    neuropathy  . Osteopenia   . Retinal detachment   . TIA (transient ischemic attack)   . Vitamin B12 deficiency     Current Outpatient Medications:  .  ketoconazole (NIZORAL) 2 % shampoo, Apply topically., Disp: , Rfl:  .  alendronate (FOSAMAX) 70 MG tablet, Take 70 mg by mouth once a week. Take with a full glass of water on an empty stomach., Disp: , Rfl:  .  allopurinol (ZYLOPRIM) 300 MG tablet, Take 300 mg by mouth daily as needed (gout)., Disp: , Rfl:  .  amLODipine (NORVASC) 10 MG tablet, Take 10 mg by mouth daily., Disp: , Rfl:  .  aspirin 325 MG EC tablet, Take 325 mg by mouth daily., Disp: , Rfl:  .  atorvastatin (LIPITOR) 10 MG tablet, Take 10 mg by mouth daily., Disp: , Rfl:  .  bacitracin-polymyxin b (POLYSPORIN) ophthalmic ointment, Place 1 application into the right eye 3 (three) times daily. apply to eye every 12 hours while awake, Disp: 3.5 g, Rfl: 0 .  brimonidine (ALPHAGAN) 0.15 % ophthalmic solution, Place 1 drop into the right eye 3 (three) times daily. , Disp: , Rfl: 1 .  brimonidine (ALPHAGAN) 0.2 % ophthalmic solution, Place 1 drop into the right eye 2 (two) times daily., Disp: 5 mL, Rfl: 12 .  calcium-vitamin D (OSCAL WITH D) 500-200 MG-UNIT per tablet, Take 1 tablet by mouth 2 (two) times daily. , Disp: , Rfl:  .  carvedilol (COREG) 25 MG  tablet, Take 25 mg by mouth 2 (two) times daily with a meal. , Disp: , Rfl: 1 .  cholecalciferol (VITAMIN D) 1000 UNITS tablet, Take 1,000 Units by mouth daily., Disp: , Rfl:  .  colchicine 0.6 MG tablet, Take 0.6 mg by mouth daily as needed (gout). Started 04/07/13, Disp: , Rfl:  .  DULoxetine (CYMBALTA) 30 MG capsule, Take 30 mg by mouth 2 (two) times daily., Disp: , Rfl:  .  Ferrous Sulfate (IRON) 325 (65 FE) MG TABS, Take 1 tablet by mouth 3 (three) times daily., Disp: , Rfl:  .  furosemide (LASIX) 20 MG tablet, Take 20 mg by mouth daily., Disp: , Rfl:  .  gabapentin (NEURONTIN) 100 MG capsule, TAKE 1 (ONE) CAPSULE AT BEDTIME, Disp: , Rfl: 3 .  gatifloxacin (ZYMAXID) 0.5 % SOLN, Place 1 drop into the right eye 4 (four) times daily., Disp: , Rfl:  .  HYDROcodone-acetaminophen (NORCO) 5-325 MG tablet, Take 1-2 tablets by mouth every 6 (six) hours as needed for moderate pain., Disp: 30 tablet, Rfl: 0 .  HYDROcodone-acetaminophen (NORCO) 5-325 MG tablet, Take 1-2 tablets by mouth daily as needed., Disp: 10 tablet, Rfl: 0 .  Insulin Glargine (TOUJEO SOLOSTAR [place]), Inject 20 Units into the skin every morning., Disp: , Rfl:  .  latanoprost (XALATAN)  0.005 % ophthalmic solution, INSTILL 1 DROP INTO LEFT EYE NIGHTLY., Disp: , Rfl: 4 .  magnesium gluconate (MAGONATE) 500 MG tablet, Take 500 mg by mouth 2 (two) times daily., Disp: , Rfl:  .  mupirocin ointment (BACTROBAN) 2 %, Place 1 application into the nose 2 (two) times daily., Disp: , Rfl:  .  pantoprazole (PROTONIX) 40 MG tablet, Take 40 mg by mouth 2 (two) times daily., Disp: , Rfl:  .  pioglitazone (ACTOS) 30 MG tablet, Take 15 mg by mouth every evening. , Disp: , Rfl:  .  Polyethyl Glycol-Propyl Glycol (SYSTANE OP), Place 1 drop into the right eye 4 (four) times daily., Disp: , Rfl:  .  prednisoLONE acetate (PRED FORTE) 1 % ophthalmic suspension, Place 1 drop into the right eye 2 (two) times daily., Disp: , Rfl:  .  timolol (TIMOPTIC) 0.5 %  ophthalmic solution, Place 1 drop into the right eye 2 (two) times daily. , Disp: , Rfl: 12 .  TRULICITY 1.5 YQ/8.2NO SOPN, Inject 1.5 mg into the skin once a week. mondays, Disp: , Rfl: 4 .  vitamin B-12 (CYANOCOBALAMIN) 1000 MCG tablet, Take 1,000 mcg by mouth daily., Disp: , Rfl:  .  vitamin B-12 (CYANOCOBALAMIN) 1000 MCG tablet, Take 1 tablet once a day, Disp: , Rfl:   Social History   Tobacco Use  Smoking Status Never Smoker  Smokeless Tobacco Never Used    Allergies  Allergen Reactions  . Altace [Ramipril] Anaphylaxis  . Diovan [Valsartan] Other (See Comments)    Hyperkalemia   . Niacin And Related Diarrhea  . Niacin Diarrhea    And nausea   Objective:   Vitals:   06/21/17 1511  BP: (!) 196/98  Pulse: 65   Body mass index is 27.03 kg/m. Constitutional Well developed. Well nourished.  Vascular Dorsalis pedis pulses present 1+ bilaterally  Posterior tibial pulses absent bilaterally  Pedal hair growth diminished. Capillary refill normal to all digits.  No cyanosis or clubbing noted.  Neurologic Normal speech. Oriented to person, place, and time. Epicritic sensation to light touch grossly present bilaterally. Protective sensation with 5.07 monofilament  present bilaterally. Vibratory sensation present bilaterally.  Dermatologic Nails elongated, thickened, dystrophic. No open wounds. No skin lesions.  Orthopedic: Normal joint ROM without pain or crepitus bilaterally. Planus bilateral Hammertoes bilateral No bony tenderness.   Assessment:   1. Diabetes mellitus type 2 with peripheral artery disease ([place])   2. Hammer toes of both feet   3. Pes planus of both feet    Plan:  Patient was evaluated and treated and all questions answered.  Diabetes with PAD, Onychomycosis -Educated on diabetic footcare. Diabetic risk level 1 -Will get diabetic shoes. No follow-ups on file.

## 2018-06-17 ENCOUNTER — Other Ambulatory Visit: Payer: Self-pay | Admitting: Family Medicine

## 2018-06-17 DIAGNOSIS — R1031 Right lower quadrant pain: Secondary | ICD-10-CM

## 2018-06-24 ENCOUNTER — Ambulatory Visit
Admission: RE | Admit: 2018-06-24 | Discharge: 2018-06-24 | Disposition: A | Discharge status: Home / Self Care | Payer: Medicare Other | Source: Ambulatory Visit | Attending: Family Medicine | Admitting: Family Medicine

## 2018-06-24 DIAGNOSIS — R1031 Right lower quadrant pain: Secondary | ICD-10-CM

## 2018-06-27 ENCOUNTER — Other Ambulatory Visit: Payer: Self-pay | Admitting: Family Medicine

## 2018-06-27 DIAGNOSIS — N289 Disorder of kidney and ureter, unspecified: Secondary | ICD-10-CM

## 2018-06-28 ENCOUNTER — Ambulatory Visit
Admission: RE | Admit: 2018-06-28 | Discharge: 2018-06-28 | Disposition: A | Discharge status: Home / Self Care | Payer: Medicare Other | Source: Ambulatory Visit | Attending: Family Medicine | Admitting: Family Medicine

## 2018-06-28 DIAGNOSIS — N289 Disorder of kidney and ureter, unspecified: Secondary | ICD-10-CM

## 2018-07-19 ENCOUNTER — Ambulatory Visit: Payer: Medicare Other | Admitting: Podiatry

## 2018-08-09 ENCOUNTER — Ambulatory Visit (INDEPENDENT_AMBULATORY_CARE_PROVIDER_SITE_OTHER): Payer: Medicare Other | Admitting: Podiatry

## 2018-08-09 ENCOUNTER — Encounter: Payer: Self-pay | Admitting: Podiatry

## 2018-08-09 DIAGNOSIS — M2041 Other hammer toe(s) (acquired), right foot: Secondary | ICD-10-CM | POA: Diagnosis not present

## 2018-08-09 DIAGNOSIS — E1169 Type 2 diabetes mellitus with other specified complication: Secondary | ICD-10-CM | POA: Diagnosis not present

## 2018-08-09 DIAGNOSIS — E1151 Type 2 diabetes mellitus with diabetic peripheral angiopathy without gangrene: Secondary | ICD-10-CM

## 2018-08-09 DIAGNOSIS — M2042 Other hammer toe(s) (acquired), left foot: Secondary | ICD-10-CM

## 2018-08-09 DIAGNOSIS — B351 Tinea unguium: Secondary | ICD-10-CM

## 2018-08-09 DIAGNOSIS — M14671 Charcot's joint, right ankle and foot: Secondary | ICD-10-CM | POA: Diagnosis not present

## 2018-08-09 DIAGNOSIS — E1142 Type 2 diabetes mellitus with diabetic polyneuropathy: Secondary | ICD-10-CM | POA: Diagnosis not present

## 2018-08-09 DIAGNOSIS — M14672 Charcot's joint, left ankle and foot: Secondary | ICD-10-CM

## 2018-08-09 NOTE — Progress Notes (Signed)
Subjective:  Patient ID: Sabrina Baker, female    DOB: 1940-08-17,  MRN: [id number]  Chief Complaint  Patient presents with  . Diabetes    Requesting nail care and diabetic shoes and insoles - last a1c was 7.0 and last glucose was 74    78 y.o. female presents  for diabetic foot care. Last AMBS was 116. Reports numbness and tingling in their feet. Denies cramping in legs and thighs.  Review of Systems: Negative except as noted in the HPI. Denies N/V/F/Ch.  Past Medical History:  Diagnosis Date  . Chronic kidney disease    stage 3  . Diabetes mellitus without complication ([place])   . Hypercholesteremia   . Hypertension   . Neuromuscular disorder (HCC)    neuropathy  . Osteopenia   . Retinal detachment   . TIA (transient ischemic attack)   . Vitamin B12 deficiency     Current Outpatient Medications:  .  amLODipine (NORVASC) 5 MG tablet, Take 5 mg by mouth daily., Disp: , Rfl:  .  atorvastatin (LIPITOR) 10 MG tablet, Take 10 mg by mouth daily., Disp: , Rfl:  .  calcium-vitamin D (OSCAL WITH D) 500-200 MG-UNIT per tablet, Take 1 tablet by mouth 2 (two) times daily. , Disp: , Rfl:  .  carvedilol (COREG) 25 MG tablet, Take 25 mg by mouth 2 (two) times daily with a meal. , Disp: , Rfl: 1 .  cholecalciferol (VITAMIN D) 1000 UNITS tablet, Take 1,000 Units by mouth daily., Disp: , Rfl:  .  colchicine 0.6 MG tablet, Take 0.6 mg by mouth daily as needed (gout). Started 04/07/13, Disp: , Rfl:  .  DULoxetine (CYMBALTA) 60 MG capsule, Take by mouth daily., Disp: , Rfl:  .  Ferrous Sulfate (IRON) 325 (65 FE) MG TABS, Take 1 tablet by mouth 3 (three) times daily., Disp: , Rfl:  .  gabapentin (NEURONTIN) 100 MG capsule, TAKE 1 (ONE) CAPSULE AT BEDTIME, Disp: , Rfl: 3 .  Insulin Glargine (TOUJEO SOLOSTAR [place]), Inject 20 Units into the skin every morning., Disp: , Rfl:  .  Insulin Pen Needle (FIFTY50 PEN NEEDLES) 32G X 4 MM MISC, USE TO INJECT INSULIN ONCE A DAY, Disp: , Rfl:  .  Lancets 30G  MISC, Use to test blood sugars 3 times a day (Dx: E11.65), Disp: , Rfl:  .  latanoprost (XALATAN) 0.005 % ophthalmic solution, INSTILL 1 DROP INTO LEFT EYE NIGHTLY., Disp: , Rfl: 4 .  mupirocin ointment (BACTROBAN) 2 %, Place 1 application into the nose 2 (two) times daily., Disp: , Rfl:  .  pantoprazole (PROTONIX) 40 MG tablet, Take 40 mg by mouth 2 (two) times daily., Disp: , Rfl:  .  pioglitazone (ACTOS) 30 MG tablet, Take 15 mg by mouth every evening. , Disp: , Rfl:  .  triamcinolone cream (KENALOG) 0.1 %, Apply topically., Disp: , Rfl:  .  TRULICITY 1.5 JJ/0.0XF SOPN, Inject 1.5 mg into the skin once a week. mondays, Disp: , Rfl: 4 .  vitamin B-12 (CYANOCOBALAMIN) 1000 MCG tablet, Take 1 tablet once a day, Disp: , Rfl:   Social History   Tobacco Use  Smoking Status Never Smoker  Smokeless Tobacco Never Used    Allergies  Allergen Reactions  . Altace [Ramipril] Anaphylaxis  . Diovan [Valsartan] Other (See Comments)    Hyperkalemia   . Niacin And Related Diarrhea  . Niacin Diarrhea    And nausea   Objective:  There were no vitals filed for this visit. There  is no height or weight on file to calculate BMI. Constitutional Well developed. Well nourished.  Vascular Dorsalis pedis pulses present 1+ bilaterally  Posterior tibial pulses present 1+ bilaterally  Pedal hair growth normal. Capillary refill normal to all digits.  No cyanosis or clubbing noted.  Neurologic Normal speech. Oriented to person, place, and time. Epicritic sensation to light touch grossly present bilaterally. Protective sensation with 5.07 monofilament  absent bilaterally.  Dermatologic Nails elongated, thickened, dystrophic. No open wounds. No skin lesions.  Orthopedic: Normal joint ROM without pain or crepitus bilaterally. Lesser digital contacture Charcot foot deformity with dropped cuboid bilat   Assessment:   1. Diabetes mellitus type 2 with peripheral artery disease ([place])   2. Hammer toes of  both feet   3. Charcot's joint of left foot   4. Charcot's joint of right foot   5. Onychomycosis of multiple toenails with type 2 diabetes mellitus and peripheral neuropathy ([place])    Plan:  Patient was evaluated and treated and all questions answered.  Diabetes with DPN, Charcot, Onychomycosis -Educated on diabetic footcare. Diabetic risk level 3 -Nails x10 debrided sharply and manually with large nail nipper and rotary burr.   Procedure: Nail Debridement Rationale: Patient meets criteria for routine foot care due to DPN Type of Debridement: manual, sharp debridement. Instrumentation: Nail nipper, rotary burr. Number of Nails: 10  Charcot Foot Bilat   -Would benefit from DM shoes for Charcot foot deformity, hammertoes, neuropathy  Return in about 3 months (around 11/07/2018) for Diabetic Foot Care.

## 2018-09-02 ENCOUNTER — Other Ambulatory Visit: Payer: Medicare Other | Admitting: Orthotics

## 2018-09-06 ENCOUNTER — Encounter (INDEPENDENT_AMBULATORY_CARE_PROVIDER_SITE_OTHER): Payer: Medicare Other | Admitting: Ophthalmology

## 2018-09-09 ENCOUNTER — Inpatient Hospital Stay (HOSPITAL_COMMUNITY)
Admission: EM | Admit: 2018-09-09 | Discharge: 2018-10-11 | DRG: 207 | Disposition: A | Discharge status: Skilled Nursing Facility | Payer: Medicare Other | Attending: Internal Medicine | Admitting: Internal Medicine

## 2018-09-09 ENCOUNTER — Inpatient Hospital Stay (HOSPITAL_COMMUNITY): Payer: Medicare Other

## 2018-09-09 ENCOUNTER — Emergency Department (HOSPITAL_COMMUNITY): Payer: Medicare Other

## 2018-09-09 ENCOUNTER — Other Ambulatory Visit: Payer: Self-pay

## 2018-09-09 DIAGNOSIS — R197 Diarrhea, unspecified: Secondary | ICD-10-CM | POA: Diagnosis present

## 2018-09-09 DIAGNOSIS — J80 Acute respiratory distress syndrome: Secondary | ICD-10-CM | POA: Diagnosis not present

## 2018-09-09 DIAGNOSIS — E1122 Type 2 diabetes mellitus with diabetic chronic kidney disease: Secondary | ICD-10-CM | POA: Diagnosis present

## 2018-09-09 DIAGNOSIS — N179 Acute kidney failure, unspecified: Secondary | ICD-10-CM | POA: Diagnosis present

## 2018-09-09 DIAGNOSIS — N183 Chronic kidney disease, stage 3 (moderate): Secondary | ICD-10-CM | POA: Diagnosis present

## 2018-09-09 DIAGNOSIS — Z0189 Encounter for other specified special examinations: Secondary | ICD-10-CM

## 2018-09-09 DIAGNOSIS — Z515 Encounter for palliative care: Secondary | ICD-10-CM | POA: Diagnosis not present

## 2018-09-09 DIAGNOSIS — N390 Urinary tract infection, site not specified: Secondary | ICD-10-CM | POA: Diagnosis not present

## 2018-09-09 DIAGNOSIS — B9689 Other specified bacterial agents as the cause of diseases classified elsewhere: Secondary | ICD-10-CM | POA: Diagnosis not present

## 2018-09-09 DIAGNOSIS — R579 Shock, unspecified: Secondary | ICD-10-CM | POA: Diagnosis not present

## 2018-09-09 DIAGNOSIS — E1169 Type 2 diabetes mellitus with other specified complication: Secondary | ICD-10-CM | POA: Diagnosis not present

## 2018-09-09 DIAGNOSIS — R54 Age-related physical debility: Secondary | ICD-10-CM | POA: Diagnosis present

## 2018-09-09 DIAGNOSIS — E870 Hyperosmolality and hypernatremia: Secondary | ICD-10-CM | POA: Diagnosis not present

## 2018-09-09 DIAGNOSIS — D539 Nutritional anemia, unspecified: Secondary | ICD-10-CM | POA: Diagnosis present

## 2018-09-09 DIAGNOSIS — J9601 Acute respiratory failure with hypoxia: Secondary | ICD-10-CM | POA: Diagnosis present

## 2018-09-09 DIAGNOSIS — M8580 Other specified disorders of bone density and structure, unspecified site: Secondary | ICD-10-CM | POA: Diagnosis present

## 2018-09-09 DIAGNOSIS — I129 Hypertensive chronic kidney disease with stage 1 through stage 4 chronic kidney disease, or unspecified chronic kidney disease: Secondary | ICD-10-CM | POA: Diagnosis present

## 2018-09-09 DIAGNOSIS — Z8673 Personal history of transient ischemic attack (TIA), and cerebral infarction without residual deficits: Secondary | ICD-10-CM

## 2018-09-09 DIAGNOSIS — M109 Gout, unspecified: Secondary | ICD-10-CM | POA: Diagnosis present

## 2018-09-09 DIAGNOSIS — A419 Sepsis, unspecified organism: Secondary | ICD-10-CM | POA: Diagnosis not present

## 2018-09-09 DIAGNOSIS — B952 Enterococcus as the cause of diseases classified elsewhere: Secondary | ICD-10-CM | POA: Diagnosis not present

## 2018-09-09 DIAGNOSIS — E1140 Type 2 diabetes mellitus with diabetic neuropathy, unspecified: Secondary | ICD-10-CM | POA: Diagnosis not present

## 2018-09-09 DIAGNOSIS — E538 Deficiency of other specified B group vitamins: Secondary | ICD-10-CM | POA: Diagnosis present

## 2018-09-09 DIAGNOSIS — J181 Lobar pneumonia, unspecified organism: Secondary | ICD-10-CM

## 2018-09-09 DIAGNOSIS — R34 Anuria and oliguria: Secondary | ICD-10-CM | POA: Diagnosis not present

## 2018-09-09 DIAGNOSIS — I10 Essential (primary) hypertension: Secondary | ICD-10-CM | POA: Diagnosis not present

## 2018-09-09 DIAGNOSIS — J810 Acute pulmonary edema: Secondary | ICD-10-CM | POA: Diagnosis not present

## 2018-09-09 DIAGNOSIS — J9690 Respiratory failure, unspecified, unspecified whether with hypoxia or hypercapnia: Secondary | ICD-10-CM

## 2018-09-09 DIAGNOSIS — I480 Paroxysmal atrial fibrillation: Secondary | ICD-10-CM | POA: Diagnosis not present

## 2018-09-09 DIAGNOSIS — E669 Obesity, unspecified: Secondary | ICD-10-CM | POA: Diagnosis not present

## 2018-09-09 DIAGNOSIS — Z888 Allergy status to other drugs, medicaments and biological substances status: Secondary | ICD-10-CM

## 2018-09-09 DIAGNOSIS — I9589 Other hypotension: Secondary | ICD-10-CM | POA: Diagnosis not present

## 2018-09-09 DIAGNOSIS — I959 Hypotension, unspecified: Secondary | ICD-10-CM | POA: Diagnosis not present

## 2018-09-09 DIAGNOSIS — E785 Hyperlipidemia, unspecified: Secondary | ICD-10-CM | POA: Diagnosis present

## 2018-09-09 DIAGNOSIS — J1289 Other viral pneumonia: Secondary | ICD-10-CM | POA: Diagnosis not present

## 2018-09-09 DIAGNOSIS — E11649 Type 2 diabetes mellitus with hypoglycemia without coma: Secondary | ICD-10-CM | POA: Diagnosis not present

## 2018-09-09 DIAGNOSIS — I444 Left anterior fascicular block: Secondary | ICD-10-CM | POA: Diagnosis present

## 2018-09-09 DIAGNOSIS — E7800 Pure hypercholesterolemia, unspecified: Secondary | ICD-10-CM | POA: Diagnosis present

## 2018-09-09 DIAGNOSIS — G9341 Metabolic encephalopathy: Secondary | ICD-10-CM | POA: Diagnosis not present

## 2018-09-09 DIAGNOSIS — F329 Major depressive disorder, single episode, unspecified: Secondary | ICD-10-CM | POA: Diagnosis present

## 2018-09-09 DIAGNOSIS — I951 Orthostatic hypotension: Secondary | ICD-10-CM | POA: Diagnosis not present

## 2018-09-09 DIAGNOSIS — Z79899 Other long term (current) drug therapy: Secondary | ICD-10-CM

## 2018-09-09 DIAGNOSIS — J988 Other specified respiratory disorders: Secondary | ICD-10-CM

## 2018-09-09 DIAGNOSIS — R509 Fever, unspecified: Secondary | ICD-10-CM

## 2018-09-09 DIAGNOSIS — R1310 Dysphagia, unspecified: Secondary | ICD-10-CM | POA: Diagnosis not present

## 2018-09-09 DIAGNOSIS — E119 Type 2 diabetes mellitus without complications: Secondary | ICD-10-CM | POA: Diagnosis not present

## 2018-09-09 DIAGNOSIS — Z01818 Encounter for other preprocedural examination: Secondary | ICD-10-CM

## 2018-09-09 DIAGNOSIS — J9621 Acute and chronic respiratory failure with hypoxia: Secondary | ICD-10-CM | POA: Diagnosis not present

## 2018-09-09 DIAGNOSIS — E0781 Sick-euthyroid syndrome: Secondary | ICD-10-CM | POA: Diagnosis not present

## 2018-09-09 DIAGNOSIS — R680 Hypothermia, not associated with low environmental temperature: Secondary | ICD-10-CM | POA: Diagnosis not present

## 2018-09-09 DIAGNOSIS — R6521 Severe sepsis with septic shock: Secondary | ICD-10-CM | POA: Diagnosis not present

## 2018-09-09 DIAGNOSIS — Z9911 Dependence on respirator [ventilator] status: Secondary | ICD-10-CM | POA: Diagnosis not present

## 2018-09-09 DIAGNOSIS — Z4659 Encounter for fitting and adjustment of other gastrointestinal appliance and device: Secondary | ICD-10-CM

## 2018-09-09 DIAGNOSIS — E875 Hyperkalemia: Secondary | ICD-10-CM | POA: Diagnosis not present

## 2018-09-09 DIAGNOSIS — G9340 Encephalopathy, unspecified: Secondary | ICD-10-CM | POA: Diagnosis not present

## 2018-09-09 DIAGNOSIS — E1165 Type 2 diabetes mellitus with hyperglycemia: Secondary | ICD-10-CM | POA: Diagnosis present

## 2018-09-09 DIAGNOSIS — J811 Chronic pulmonary edema: Secondary | ICD-10-CM

## 2018-09-09 DIAGNOSIS — F418 Other specified anxiety disorders: Secondary | ICD-10-CM | POA: Diagnosis not present

## 2018-09-09 DIAGNOSIS — R0602 Shortness of breath: Secondary | ICD-10-CM

## 2018-09-09 DIAGNOSIS — Z794 Long term (current) use of insulin: Secondary | ICD-10-CM

## 2018-09-09 DIAGNOSIS — R061 Stridor: Secondary | ICD-10-CM | POA: Diagnosis not present

## 2018-09-09 DIAGNOSIS — R7989 Other specified abnormal findings of blood chemistry: Secondary | ICD-10-CM | POA: Diagnosis present

## 2018-09-09 DIAGNOSIS — Z9289 Personal history of other medical treatment: Secondary | ICD-10-CM

## 2018-09-09 LAB — COMPREHENSIVE METABOLIC PANEL
ALT: 29 U/L (ref 0–44)
AST: 97 U/L — ABNORMAL HIGH (ref 15–41)
Albumin: 2.7 g/dL — ABNORMAL LOW (ref 3.5–5.0)
Alkaline Phosphatase: 35 U/L — ABNORMAL LOW (ref 38–126)
Anion gap: 16 — ABNORMAL HIGH (ref 5–15)
BUN: 53 mg/dL — ABNORMAL HIGH (ref 8–23)
CO2: 18 mmol/L — ABNORMAL LOW (ref 22–32)
Calcium: 8.4 mg/dL — ABNORMAL LOW (ref 8.9–10.3)
Chloride: 97 mmol/L — ABNORMAL LOW (ref 98–111)
Creatinine, Ser: 2.44 mg/dL — ABNORMAL HIGH (ref 0.44–1.00)
GFR calc Af Amer: 21 mL/min — ABNORMAL LOW (ref >60)
GFR calc non Af Amer: 18 mL/min — ABNORMAL LOW (ref >60)
Glucose, Bld: 356 mg/dL — ABNORMAL HIGH (ref 70–99)
Potassium: 5.5 mmol/L — ABNORMAL HIGH (ref 3.5–5.1)
Sodium: 131 mmol/L — ABNORMAL LOW (ref 135–145)
Total Bilirubin: 0.8 mg/dL (ref 0.3–1.2)
Total Protein: 7 g/dL (ref 6.5–8.1)

## 2018-09-09 LAB — STREP PNEUMONIAE URINARY ANTIGEN: Strep Pneumo Urinary Antigen: NEGATIVE

## 2018-09-09 LAB — RESPIRATORY PANEL BY PCR
Adenovirus: NOT DETECTED
Bordetella pertussis: NOT DETECTED
Chlamydophila pneumoniae: NOT DETECTED
Coronavirus 229E: NOT DETECTED
Coronavirus HKU1: NOT DETECTED
Coronavirus NL63: NOT DETECTED
Coronavirus OC43: NOT DETECTED
Influenza A: NOT DETECTED
Influenza B: NOT DETECTED
Metapneumovirus: NOT DETECTED
Mycoplasma pneumoniae: NOT DETECTED
Parainfluenza Virus 1: NOT DETECTED
Parainfluenza Virus 2: NOT DETECTED
Parainfluenza Virus 3: NOT DETECTED
Parainfluenza Virus 4: NOT DETECTED
RESPIRATORY SYNCYTIAL VIRUS-RVPPCR: NOT DETECTED
RHINOVIRUS / ENTEROVIRUS - RVPPCR: NOT DETECTED

## 2018-09-09 LAB — POCT I-STAT 7, (LYTES, BLD GAS, ICA,H+H)
Acid-base deficit: 4 mmol/L — ABNORMAL HIGH (ref 0.0–2.0)
Bicarbonate: 22.2 mmol/L (ref 20.0–28.0)
Calcium, Ion: 1.16 mmol/L (ref 1.15–1.40)
HCT: 35 % — ABNORMAL LOW (ref 36.0–46.0)
Hemoglobin: 11.9 g/dL — ABNORMAL LOW (ref 12.0–15.0)
O2 Saturation: 100 %
Patient temperature: 98.7
Potassium: 4.2 mmol/L (ref 3.5–5.1)
Sodium: 135 mmol/L (ref 135–145)
TCO2: 23 mmol/L (ref 22–32)
pCO2 arterial: 41.8 mmHg (ref 32.0–48.0)
pH, Arterial: 7.333 — ABNORMAL LOW (ref 7.350–7.450)
pO2, Arterial: 219 mmHg — ABNORMAL HIGH (ref 83.0–108.0)

## 2018-09-09 LAB — POCT I-STAT EG7
Acid-base deficit: 3 mmol/L — ABNORMAL HIGH (ref 0.0–2.0)
BICARBONATE: 21.3 mmol/L (ref 20.0–28.0)
Calcium, Ion: 1.01 mmol/L — ABNORMAL LOW (ref 1.15–1.40)
HCT: 34 % — ABNORMAL LOW (ref 36.0–46.0)
Hemoglobin: 11.6 g/dL — ABNORMAL LOW (ref 12.0–15.0)
O2 SAT: 98 %
Potassium: 4.6 mmol/L (ref 3.5–5.1)
Sodium: 132 mmol/L — ABNORMAL LOW (ref 135–145)
TCO2: 22 mmol/L (ref 22–32)
pCO2, Ven: 35 mmHg — ABNORMAL LOW (ref 44.0–60.0)
pH, Ven: 7.392 (ref 7.250–7.430)
pO2, Ven: 112 mmHg — ABNORMAL HIGH (ref 32.0–45.0)

## 2018-09-09 LAB — CBC WITH DIFFERENTIAL/PLATELET
Abs Immature Granulocytes: 0.12 10*3/uL — ABNORMAL HIGH (ref 0.00–0.07)
Basophils Absolute: 0 10*3/uL (ref 0.0–0.1)
Basophils Relative: 0 %
EOS PCT: 0 %
Eosinophils Absolute: 0 10*3/uL (ref 0.0–0.5)
HCT: 39.4 % (ref 36.0–46.0)
Hemoglobin: 12.6 g/dL (ref 12.0–15.0)
Immature Granulocytes: 1 %
Lymphocytes Relative: 7 %
Lymphs Abs: 1 10*3/uL (ref 0.7–4.0)
MCH: 30.8 pg (ref 26.0–34.0)
MCHC: 32 g/dL (ref 30.0–36.0)
MCV: 96.3 fL (ref 80.0–100.0)
MONO ABS: 0.5 10*3/uL (ref 0.1–1.0)
Monocytes Relative: 4 %
Neutro Abs: 12.7 10*3/uL — ABNORMAL HIGH (ref 1.7–7.7)
Neutrophils Relative %: 88 %
Platelets: 292 10*3/uL (ref 150–400)
RBC: 4.09 MIL/uL (ref 3.87–5.11)
RDW: 14.1 % (ref 11.5–15.5)
WBC: 14.4 10*3/uL — AB (ref 4.0–10.5)
nRBC: 0 % (ref 0.0–0.2)

## 2018-09-09 LAB — TROPONIN I
Troponin I: 0.33 ng/mL (ref <0.03)
Troponin I: 0.1 ng/mL (ref <0.03)
Troponin I: 0.06 ng/mL (ref <0.03)

## 2018-09-09 LAB — URINALYSIS, ROUTINE W REFLEX MICROSCOPIC
Bilirubin Urine: NEGATIVE
Glucose, UA: NEGATIVE mg/dL
Ketones, ur: NEGATIVE mg/dL
Leukocytes,Ua: NEGATIVE
Nitrite: NEGATIVE
Protein, ur: 30 mg/dL — AB
Specific Gravity, Urine: 1.017 (ref 1.005–1.030)
pH: 5 (ref 5.0–8.0)

## 2018-09-09 LAB — GLUCOSE, CAPILLARY
GLUCOSE-CAPILLARY: 173 mg/dL — AB (ref 70–99)
Glucose-Capillary: 129 mg/dL — ABNORMAL HIGH (ref 70–99)
Glucose-Capillary: 287 mg/dL — ABNORMAL HIGH (ref 70–99)

## 2018-09-09 LAB — MRSA PCR SCREENING: MRSA by PCR: NEGATIVE

## 2018-09-09 LAB — LACTIC ACID, PLASMA: Lactic Acid, Venous: 1.6 mmol/L (ref 0.5–1.9)

## 2018-09-09 LAB — BRAIN NATRIURETIC PEPTIDE: B Natriuretic Peptide: 295.2 pg/mL — ABNORMAL HIGH (ref 0.0–100.0)

## 2018-09-09 MED ORDER — ASPIRIN 81 MG PO CHEW
324.0000 mg | CHEWABLE_TABLET | Freq: Once | ORAL | Status: AC
  Administered 2018-09-09: 324 mg via ORAL
  Filled 2018-09-09: qty 4

## 2018-09-09 MED ORDER — SODIUM CHLORIDE 0.9 % IV SOLN
1.0000 g | Freq: Once | INTRAVENOUS | Status: AC
  Administered 2018-09-09: 1 g via INTRAVENOUS
  Filled 2018-09-09: qty 10

## 2018-09-09 MED ORDER — MIDAZOLAM BOLUS VIA INFUSION
1.0000 mg | INTRAVENOUS | Status: DC | PRN
  Administered 2018-09-10 (×3): 1 mg via INTRAVENOUS
  Filled 2018-09-09: qty 1

## 2018-09-09 MED ORDER — FENTANYL CITRATE (PF) 100 MCG/2ML IJ SOLN
50.0000 ug | Freq: Once | INTRAMUSCULAR | Status: AC
  Administered 2018-09-09: 50 ug via INTRAVENOUS

## 2018-09-09 MED ORDER — BISACODYL 10 MG RE SUPP: 10.0000 mg | Freq: Every day | RECTAL | Status: DC | PRN

## 2018-09-09 MED ORDER — HEPARIN SODIUM (PORCINE) 5000 UNIT/ML IJ SOLN
5000.0000 [IU] | Freq: Three times a day (TID) | INTRAMUSCULAR | Status: DC
  Administered 2018-09-09 – 2018-09-11 (×6): 5000 [IU] via SUBCUTANEOUS
  Filled 2018-09-09 (×6): qty 1

## 2018-09-09 MED ORDER — MIDAZOLAM 50MG/50ML (1MG/ML) PREMIX INFUSION
1.0000 mg/h | INTRAVENOUS | Status: DC
  Administered 2018-09-09 – 2018-09-10 (×2): 2 mg/h via INTRAVENOUS
  Filled 2018-09-09 (×2): qty 50

## 2018-09-09 MED ORDER — PANTOPRAZOLE SODIUM 40 MG IV SOLR
40.0000 mg | Freq: Every day | INTRAVENOUS | Status: DC
  Administered 2018-09-09 – 2018-09-10 (×2): 40 mg via INTRAVENOUS
  Filled 2018-09-09 (×2): qty 40

## 2018-09-09 MED ORDER — ORAL CARE MOUTH RINSE
15.0000 mL | OROMUCOSAL | Status: DC
  Administered 2018-09-09 – 2018-09-25 (×152): 15 mL via OROMUCOSAL

## 2018-09-09 MED ORDER — FENTANYL CITRATE (PF) 100 MCG/2ML IJ SOLN
50.0000 ug | INTRAMUSCULAR | Status: AC | PRN
  Administered 2018-09-09 – 2018-09-10 (×3): 50 ug via INTRAVENOUS
  Filled 2018-09-09 (×2): qty 2

## 2018-09-09 MED ORDER — ROCURONIUM BROMIDE 50 MG/5ML IV SOLN
10.0000 mg | Freq: Once | INTRAVENOUS | Status: AC
  Administered 2018-09-09: 10 mg via INTRAVENOUS

## 2018-09-09 MED ORDER — MIDAZOLAM HCL 2 MG/2ML IJ SOLN: 1.0000 mg | INTRAMUSCULAR | Status: DC | PRN

## 2018-09-09 MED ORDER — ALBUTEROL SULFATE HFA 108 (90 BASE) MCG/ACT IN AERS
2.0000 | INHALATION_SPRAY | RESPIRATORY_TRACT | Status: DC | PRN
  Filled 2018-09-09: qty 6.7

## 2018-09-09 MED ORDER — INSULIN ASPART 100 UNIT/ML ~~LOC~~ SOLN
0.0000 [IU] | SUBCUTANEOUS | Status: DC
  Administered 2018-09-09: 3 [IU] via SUBCUTANEOUS
  Administered 2018-09-09: 2 [IU] via SUBCUTANEOUS
  Administered 2018-09-09 – 2018-09-10 (×2): 8 [IU] via SUBCUTANEOUS
  Administered 2018-09-10: 2 [IU] via SUBCUTANEOUS
  Administered 2018-09-10: 3 [IU] via SUBCUTANEOUS
  Administered 2018-09-11 (×3): 5 [IU] via SUBCUTANEOUS
  Administered 2018-09-11: 2 [IU] via SUBCUTANEOUS
  Administered 2018-09-11: 8 [IU] via SUBCUTANEOUS
  Administered 2018-09-11: 3 [IU] via SUBCUTANEOUS
  Administered 2018-09-12: 8 [IU] via SUBCUTANEOUS
  Administered 2018-09-12: 5 [IU] via SUBCUTANEOUS
  Administered 2018-09-12: 2 [IU] via SUBCUTANEOUS
  Administered 2018-09-12 – 2018-09-13 (×4): 5 [IU] via SUBCUTANEOUS
  Administered 2018-09-13 (×2): 8 [IU] via SUBCUTANEOUS
  Administered 2018-09-13 (×2): 5 [IU] via SUBCUTANEOUS
  Administered 2018-09-13 – 2018-09-14 (×3): 8 [IU] via SUBCUTANEOUS
  Administered 2018-09-14: 20:00:00 5 [IU] via SUBCUTANEOUS
  Administered 2018-09-14: 8 [IU] via SUBCUTANEOUS
  Administered 2018-09-14 – 2018-09-15 (×4): 5 [IU] via SUBCUTANEOUS
  Administered 2018-09-15: 8 [IU] via SUBCUTANEOUS
  Administered 2018-09-15: 5 [IU] via SUBCUTANEOUS
  Administered 2018-09-15: 8 [IU] via SUBCUTANEOUS
  Administered 2018-09-15: 5 [IU] via SUBCUTANEOUS
  Administered 2018-09-15 – 2018-09-16 (×2): 11 [IU] via SUBCUTANEOUS
  Administered 2018-09-16: 8 [IU] via SUBCUTANEOUS
  Administered 2018-09-16 (×3): 5 [IU] via SUBCUTANEOUS
  Administered 2018-09-16: 04:00:00 3 [IU] via SUBCUTANEOUS
  Administered 2018-09-17 (×2): 5 [IU] via SUBCUTANEOUS

## 2018-09-09 MED ORDER — DOCUSATE SODIUM 50 MG/5ML PO LIQD
100.0000 mg | Freq: Two times a day (BID) | ORAL | Status: DC | PRN
  Filled 2018-09-09: qty 10

## 2018-09-09 MED ORDER — FENTANYL CITRATE (PF) 100 MCG/2ML IJ SOLN
50.0000 ug | INTRAMUSCULAR | Status: DC | PRN
  Administered 2018-09-10 – 2018-09-25 (×32): 50 ug via INTRAVENOUS
  Filled 2018-09-09 (×20): qty 2

## 2018-09-09 MED ORDER — SODIUM CHLORIDE 0.9 % IV SOLN
1.0000 g | INTRAVENOUS | Status: DC
  Administered 2018-09-10 – 2018-09-14 (×5): 1 g via INTRAVENOUS
  Filled 2018-09-09 (×6): qty 10

## 2018-09-09 MED ORDER — MIDAZOLAM HCL 2 MG/2ML IJ SOLN
INTRAMUSCULAR | Status: AC
  Filled 2018-09-09: qty 2

## 2018-09-09 MED ORDER — SODIUM CHLORIDE 0.9 % IV SOLN
500.0000 mg | INTRAVENOUS | Status: AC
  Administered 2018-09-10 – 2018-09-15 (×6): 500 mg via INTRAVENOUS
  Filled 2018-09-09 (×7): qty 500

## 2018-09-09 MED ORDER — SODIUM CHLORIDE 0.9 % IV SOLN
500.0000 mg | Freq: Once | INTRAVENOUS | Status: AC
  Administered 2018-09-09: 500 mg via INTRAVENOUS
  Filled 2018-09-09: qty 500

## 2018-09-09 MED ORDER — FENTANYL CITRATE (PF) 100 MCG/2ML IJ SOLN
INTRAMUSCULAR | Status: AC
  Administered 2018-09-09: 50 ug via INTRAVENOUS
  Filled 2018-09-09: qty 2

## 2018-09-09 MED ORDER — ETOMIDATE 2 MG/ML IV SOLN
20.0000 mg | Freq: Once | INTRAVENOUS | Status: AC
  Administered 2018-09-09: 20 mg via INTRAVENOUS

## 2018-09-09 MED ORDER — MIDAZOLAM HCL 2 MG/2ML IJ SOLN
1.0000 mg | INTRAMUSCULAR | Status: DC | PRN
  Administered 2018-09-09: 1 mg via INTRAVENOUS
  Filled 2018-09-09: qty 2

## 2018-09-09 MED ORDER — FENTANYL CITRATE (PF) 100 MCG/2ML IJ SOLN: 50.0000 ug | INTRAMUSCULAR | Status: DC | PRN

## 2018-09-09 MED ORDER — ALBUTEROL SULFATE HFA 108 (90 BASE) MCG/ACT IN AERS
4.0000 | INHALATION_SPRAY | Freq: Once | RESPIRATORY_TRACT | Status: AC
  Administered 2018-09-09: 4 via RESPIRATORY_TRACT
  Filled 2018-09-09: qty 6.7

## 2018-09-09 MED ORDER — CHLORHEXIDINE GLUCONATE 0.12% ORAL RINSE (MEDLINE KIT)
15.0000 mL | Freq: Two times a day (BID) | OROMUCOSAL | Status: DC
  Administered 2018-09-09 – 2018-09-25 (×32): 15 mL via OROMUCOSAL

## 2018-09-09 MED ORDER — FENTANYL CITRATE (PF) 100 MCG/2ML IJ SOLN
INTRAMUSCULAR | Status: AC
  Filled 2018-09-09: qty 2

## 2018-09-09 MED ORDER — MIDAZOLAM HCL 2 MG/2ML IJ SOLN
1.0000 mg | Freq: Once | INTRAMUSCULAR | Status: AC
  Administered 2018-09-09: 1 mg via INTRAVENOUS

## 2018-09-09 MED ORDER — FUROSEMIDE 10 MG/ML IJ SOLN
40.0000 mg | Freq: Once | INTRAMUSCULAR | Status: AC
  Administered 2018-09-09: 40 mg via INTRAVENOUS
  Filled 2018-09-09: qty 4

## 2018-09-09 MED ORDER — DEXMEDETOMIDINE HCL IN NACL 400 MCG/100ML IV SOLN
0.0000 ug/kg/h | INTRAVENOUS | Status: AC
  Administered 2018-09-09 – 2018-09-10 (×4): 0.4 ug/kg/h via INTRAVENOUS
  Administered 2018-09-10 – 2018-09-11 (×3): 0.6 ug/kg/h via INTRAVENOUS
  Administered 2018-09-12 (×2): 0.4 ug/kg/h via INTRAVENOUS
  Filled 2018-09-09 (×9): qty 100

## 2018-09-09 MED ORDER — MIDAZOLAM HCL 2 MG/2ML IJ SOLN
INTRAMUSCULAR | Status: AC
  Administered 2018-09-09: 1 mg via INTRAVENOUS
  Filled 2018-09-09: qty 2

## 2018-09-09 NOTE — ED Provider Notes (Addendum)
[facility] EMERGENCY DEPARTMENT Provider Note   CSN: [id number] Arrival date & time: 09/09/18  0940    History   Chief Complaint Chief Complaint  Patient presents with  . Abdominal Pain  . Cough    HPI RICHA SHOR is a 78 y.o. female.     HPI  78 year old female presents with shortness of breath, generalized weakness, and diarrhea.  She is a poor historian.  It seems like the symptoms started a couple weeks ago and have progressively worsened.  Originally started with some cough.  Occasionally she is had posttussive emesis.  She has not noted any fevers.  Over the last 1 week the cough seems to be worse and she seems to be short of breath.  She denies headache or chest pain.  She is been having right lower abdominal pain for multiple months and has had a CT scan and an ultrasound in January that did not reveal anything.  She notes diarrhea over this past time but she states is only once per day and no blood.  The pain in her right lower abdomen is similar, maybe a little bit worse.  Past Medical History:  Diagnosis Date  . Chronic kidney disease    stage 3  . Diabetes mellitus without complication ([place])   . Hypercholesteremia   . Hypertension   . Neuromuscular disorder (HCC)    neuropathy  . Osteopenia   . Retinal detachment   . TIA (transient ischemic attack)   . Vitamin B12 deficiency     Patient Active Problem List   Diagnosis Date Noted  . Acute respiratory failure with hypoxia ([place]) 09/09/2018  . Mixed dyslipidemia 07/10/2016  . Osteopenia 07/10/2016  . Vitamin B12 deficiency 07/10/2016  . 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 Aspiration pneumonia ([place]) 04/17/2012  . Gastroenteritis 04/17/2012  . Dehydration 04/17/2012  . Diabetes mellitus ([place]) 04/17/2012  . HTN (hypertension) 04/17/2012  . TIA (transient ischemic attack) 04/17/2012    Past Surgical History:  Procedure Laterality Date  . [place]  VITRECTOMY WITH 20 GAUGE MVR PORT Right 03/16/2015   Procedure: 25 GAUGE PARS PLANA VITRECTOMY WITH 23 GAUGE MVR PORT;  Surgeon: Hayden Pedro, MD;  Location: [facility];  Service: Ophthalmology;  Laterality: Right;  . ANKLE SURGERY    . BREAST EXCISIONAL BIOPSY    . BREAST LUMPECTOMY WITH RADIOACTIVE SEED LOCALIZATION Right 01/24/2016   Procedure: RIGHT BREAST LUMPECTOMY WITH RADIOACTIVE SEED LOCALIZATION;  Surgeon: Coralie Keens, MD;  Location: [facility];  Service: General;  Laterality: Right;  . EYE SURGERY     detached retna X2  . HEEL SPUR SURGERY  2012  . INJECTION OF SILICONE OIL Right 69/11/2950   Procedure: Extraction OF SILICONE OIL;  Surgeon: Hayden Pedro, MD;  Location: [facility];  Service: Ophthalmology;  Laterality: Right;  . ORIF ANKLE FRACTURE Left 06/17/2014   Procedure: OPEN REDUCTION INTERNAL FIXATION (ORIF) LEFT BIMALLEOLAR ANKLE FRACTURE;  Surgeon: Marianna Payment, MD;  Location: [facility];  Service: Orthopedics;  Laterality: Left;  . PHOTOCOAGULATION WITH LASER Right 03/16/2015   Procedure: PHOTOCOAGULATION WITH LASER;  Surgeon: Hayden Pedro, MD;  Location: [facility];  Service: Ophthalmology;  Laterality: Right;  . SHOULDER ARTHROSCOPY Right    plates and screws  . VITRECTOMY Right 03/16/2015  . WRIST ARTHROPLASTY Left    plates and screws and bone graft     OB History   No obstetric history on file.  Home Medications    Prior to Admission medications   Medication Sig Start Date End Date Taking? Authorizing Provider  amLODipine (NORVASC) 5 MG tablet Take 5 mg by mouth daily. 06/17/18  Yes [provider]  atorvastatin (LIPITOR) 10 MG tablet Take 10 mg by mouth daily.   Yes [provider]  carvedilol (COREG) 25 MG tablet Take 25 mg by mouth 2 (two) times daily with a meal.  05/04/14  Yes [provider]  DULoxetine (CYMBALTA) 60 MG capsule Take by mouth daily. 07/03/18  Yes [provider]  gabapentin (NEURONTIN)  100 MG capsule Take 100 mg by mouth at bedtime.  06/09/17  Yes [provider]  Insulin Glargine (TOUJEO SOLOSTAR ) Inject 20 Units into the skin every morning.   Yes [provider]  latanoprost (XALATAN) 0.005 % ophthalmic solution Place 1 drop into the left eye at bedtime.  05/20/17  Yes [provider]  mupirocin ointment (BACTROBAN) 2 % Place 1 application into the nose 2 (two) times daily.   Yes [provider]  pantoprazole (PROTONIX) 40 MG tablet Take 40 mg by mouth 2 (two) times daily.   Yes [provider]  pioglitazone (ACTOS) 30 MG tablet Take 15 mg by mouth every evening.    Yes [provider]  triamcinolone cream (KENALOG) 0.1 % Apply 1 application topically 2 (two) times daily.  01/09/17  Yes [provider]  TRULICITY 1.5 QZ/0.0PQ SOPN Inject 1.5 mg into the skin every Monday.  05/28/14  Yes [provider]  calcium-vitamin D (OSCAL WITH D) 500-200 MG-UNIT per tablet Take 1 tablet by mouth 2 (two) times daily.     [provider]  cholecalciferol (VITAMIN D) 1000 UNITS tablet Take 1,000 Units by mouth daily.    [provider]  colchicine 0.6 MG tablet Take 0.6 mg by mouth daily as needed (gout). Started 04/07/13    [provider]  Ferrous Sulfate (IRON) 325 (65 FE) MG TABS Take 1 tablet by mouth 3 (three) times daily.    [provider]  Insulin Pen Needle (FIFTY50 PEN NEEDLES) 32G X 4 MM MISC USE TO INJECT INSULIN ONCE A DAY 04/05/18   [provider]  Lancets 30G MISC Use to test blood sugars 3 times a day (Dx: E11.65)    [provider]  vitamin B-12 (CYANOCOBALAMIN) 1000 MCG tablet Take 1 tablet once a day    [provider]    Family History No family history on file.  Social History Social History   Tobacco Use  . Smoking status: Never Smoker  . Smokeless tobacco: Never Used  Substance Use Topics  . Alcohol use: No  . Drug use: No      Allergies   Altace [ramipril]; Diovan [valsartan]; Niacin and related; and Niacin   Review of Systems Review of Systems  Constitutional: Positive for unexpected weight change (20 pound weight loss during this time). Negative for fever.  Respiratory: Positive for cough and shortness of breath.   Cardiovascular: Negative for chest pain and leg swelling.  Gastrointestinal: Positive for diarrhea and vomiting (post tussive). Negative for abdominal pain and blood in stool.  Neurological: Positive for weakness.  All other systems reviewed and are negative.    Physical Exam Updated Vital Signs BP (!) 145/77   Pulse 76   Temp 98.7 F (37.1 C) (Oral)   Resp (!) 27   Ht '5\' 3"'  (1.6 m)   Wt 78 kg   SpO2  100%   BMI 30.47 kg/m   Physical Exam Vitals signs and nursing note reviewed.  Constitutional:      General: She is not in acute distress.    Appearance: She is well-developed. She is not ill-appearing or diaphoretic.  HENT:     Head: Normocephalic and atraumatic.     Right Ear: External ear normal.     Left Ear: External ear normal.     Nose: Nose normal.  Eyes:     General:        Right eye: No discharge.        Left eye: No discharge.  Cardiovascular:     Rate and Rhythm: Normal rate and regular rhythm.     Heart sounds: Normal heart sounds.  Pulmonary:     Effort: Pulmonary effort is normal. No tachypnea or accessory muscle usage.     Breath sounds: Rales (diffusely) present.  Abdominal:     Palpations: Abdomen is soft.     Tenderness: There is no abdominal tenderness.  Skin:    General: Skin is warm and dry.  Neurological:     Mental Status: She is alert.  Psychiatric:        Mood and Affect: Mood is not anxious.      ED Treatments / Results  Labs (all labs ordered are listed, but only abnormal results are displayed) Labs Reviewed  COMPREHENSIVE METABOLIC PANEL - Abnormal; Notable for the following components:      Result Value   Sodium 131 (*)     Potassium 5.5 (*)    Chloride 97 (*)    CO2 18 (*)    Glucose, Bld 356 (*)    BUN 53 (*)    Creatinine, Ser 2.44 (*)    Calcium 8.4 (*)    Albumin 2.7 (*)    AST 97 (*)    Alkaline Phosphatase 35 (*)    GFR calc non Af Amer 18 (*)    GFR calc Af Amer 21 (*)    Anion gap 16 (*)    All other components within normal limits  CBC WITH DIFFERENTIAL/PLATELET - Abnormal; Notable for the following components:   WBC 14.4 (*)    Neutro Abs 12.7 (*)    Abs Immature Granulocytes 0.12 (*)    All other components within normal limits  URINALYSIS, ROUTINE W REFLEX MICROSCOPIC - Abnormal; Notable for the following components:   Color, Urine AMBER (*)    APPearance HAZY (*)    Hgb urine dipstick MODERATE (*)    Protein, ur 30 (*)    Bacteria, UA RARE (*)    All other components within normal limits  BRAIN NATRIURETIC PEPTIDE - Abnormal; Notable for the following components:   B Natriuretic Peptide 295.2 (*)    All other components within normal limits  TROPONIN I - Abnormal; Notable for the following components:   Troponin I 0.33 (*)    All other components within normal limits  GLUCOSE, CAPILLARY - Abnormal; Notable for the following components:   Glucose-Capillary 287 (*)    All other components within normal limits  POCT I-STAT EG7 - Abnormal; Notable for the following components:   pCO2, Ven 35.0 (*)    pO2, Ven 112.0 (*)    Acid-base deficit 3.0 (*)    Sodium 132 (*)    Calcium, Ion 1.01 (*)    HCT 34.0 (*)    Hemoglobin 11.6 (*)    All other components within normal limits  RESPIRATORY PANEL  BY PCR  CULTURE, BLOOD (ROUTINE X 2)  CULTURE, BLOOD (ROUTINE X 2)  NOVEL CORONAVIRUS, NAA (HOSPITAL ORDER, SEND-OUT TO REF LAB)  CULTURE, RESPIRATORY  LACTIC ACID, PLASMA  TROPONIN I  TROPONIN I  STREP PNEUMONIAE URINARY ANTIGEN  LEGIONELLA PNEUMOPHILA SEROGP 1 UR AG    EKG EKG Interpretation  Date/Time:  Monday September 09 2018 09:55:59 EDT Ventricular Rate:  75 PR Interval:     QRS Duration: 101 QT Interval:  403 QTC Calculation: 451 R Axis:   -47 Text Interpretation:  Sinus rhythm Atrial premature complex Left anterior fascicular block Abnormal R-wave progression, late transition similar to Jan 2018 Confirmed by Sherwood Gambler [phone]) on 09/09/2018 10:40:21 AM   Radiology Dg Chest Port 1 View  Result Date: 09/09/2018 CLINICAL DATA:  Short of breath, cough.  Diarrhea. EXAM: PORTABLE CHEST 1 VIEW COMPARISON:  Chest radiograph 05/29/2012 FINDINGS: Normal cardiac silhouette. There is bilateral fine airspace disease in the upper lobes. No pleural fluid. No pneumothorax. No acute osseous abnormality. RIGHT shoulder internal fixation. IMPRESSION: Bilateral upper lobe airspace disease representing multifocal pneumonia versus pulmonary edema. Electronically Signed   By: Suzy Bouchard M.D.   On: 09/09/2018 11:27    Procedures .Critical Care Performed by: Sherwood Gambler, MD Authorized by: Sherwood Gambler, MD   Critical care provider statement:    Critical care time (minutes):  45   Critical care time was exclusive of:  Separately billable procedures and treating other patients   Critical care was necessary to treat or prevent imminent or life-threatening deterioration of the following conditions:  Respiratory failure   Critical care was time spent personally by me on the following activities:  Discussions with consultants, evaluation of patient's response to treatment, examination of patient, ordering and performing treatments and interventions, ordering and review of laboratory studies, ordering and review of radiographic studies, pulse oximetry, re-evaluation of patient's condition, obtaining history from patient or surrogate and review of old charts   (including critical care time)  Medications Ordered in ED Medications  heparin injection 5,000 Units (has no administration in time range)  pantoprazole (PROTONIX) injection 40 mg (has no administration in time  range)  chlorhexidine gluconate (MEDLINE KIT) (PERIDEX) 0.12 % solution 15 mL (has no administration in time range)  MEDLINE mouth rinse (has no administration in time range)  midazolam (VERSED) injection 1 mg (has no administration in time range)  midazolam (VERSED) injection 1 mg (has no administration in time range)  docusate (COLACE) 50 MG/5ML liquid 100 mg (has no administration in time range)  bisacodyl (DULCOLAX) suppository 10 mg (has no administration in time range)  azithromycin (ZITHROMAX) 500 mg in sodium chloride 0.9 % 250 mL IVPB (has no administration in time range)  cefTRIAXone (ROCEPHIN) 1 g in sodium chloride 0.9 % 100 mL IVPB (has no administration in time range)  fentaNYL (SUBLIMAZE) injection 50 mcg (has no administration in time range)  fentaNYL (SUBLIMAZE) injection 50 mcg (has no administration in time range)  insulin aspart (novoLOG) injection 0-15 Units (has no administration in time range)  albuterol (PROVENTIL HFA;VENTOLIN HFA) 108 (90 Base) MCG/ACT inhaler 2 puff (has no administration in time range)  furosemide (LASIX) injection 40 mg (has no administration in time range)  fentaNYL (SUBLIMAZE) 100 MCG/2ML injection (has no administration in time range)  midazolam (VERSED) 2 MG/2ML injection (has no administration in time range)  fentaNYL (SUBLIMAZE) injection 50 mcg (has no administration in time range)  midazolam (VERSED) injection 1 mg (has no administration in time range)  etomidate (AMIDATE) injection 20 mg (has no administration in time range)  rocuronium (ZEMURON) injection 10 mg (has no administration in time range)  cefTRIAXone (ROCEPHIN) 1 g in sodium chloride 0.9 % 100 mL IVPB (0 g Intravenous Stopped 09/09/18 1248)  azithromycin (ZITHROMAX) 500 mg in sodium chloride 0.9 % 250 mL IVPB ( Intravenous Stopped 09/09/18 1351)  aspirin chewable tablet 324 mg (324 mg Oral Given 09/09/18 1251)  albuterol (PROVENTIL HFA;VENTOLIN HFA) 108 (90 Base) MCG/ACT inhaler 4  puff (4 puffs Inhalation Given 09/09/18 1310)     Initial Impression / Assessment and Plan / ED Course  I have reviewed the triage vital signs and the nursing notes.  Pertinent labs & imaging results that were available during my care of the patient were reviewed by me and considered in my medical decision making (see chart for details).        Patient was found to have O2 sats in the 60s though she is not in distress.  This came up with supplemental oxygen via nasal cannula.  However while in the ED she slowly started to have increasing O2 requirements though she remained only having mild tachypnea.  However on 5 or 6 L she was at 83% and so she was placed on nonrebreather.  Originally had plan to call hospitalist but given the progressive worsening and expected course, ICU was consulted.  She is not currently in a negative pressure room and is not in distress and so while she will need to be intubated, she does not need to be emergently intubated in the next few minutes.  Thus while discussing with ICU we will transport her to the ICU to a negative pressure room and then intubate for staff safety.  Given antibiotics for pneumonia.  LILIENNE WEINS was evaluated in Emergency Department on 09/09/2018 for the symptoms described in the history of present illness. She was evaluated in the context of the global COVID-19 pandemic, which necessitated consideration that the patient might be at risk for infection with the SARS-CoV-2 virus that causes COVID-19. Institutional protocols and algorithms that pertain to the evaluation of patients at risk for COVID-19 are in a state of rapid change based on information released by regulatory bodies including the CDC and federal and state organizations. These policies and algorithms were followed during the patient's care in the ED.   Final Clinical Impressions(s) / ED Diagnoses   Final diagnoses:  Respiratory failure [facility])    ED Discharge Orders    None        Sherwood Gambler, MD 09/09/18 1539    Sherwood Gambler, MD 09/09/18 [phone]

## 2018-09-09 NOTE — Progress Notes (Signed)
FIO2 dropped to 60% per SP02 of 100%.

## 2018-09-09 NOTE — Progress Notes (Signed)
Pt having increased O2 needs at this time and plan is to intubate her when she arrives in ICU due to her not being in a negative pressure room at this point. RT called receiving RT to give her report. RT will continue to monitor

## 2018-09-09 NOTE — Procedures (Signed)
Sabrina Baker  Procedure: Sabrina Indications: Airway protection and maintenance  Procedure Details Consent: Risks of procedure as well as the alternatives and risks of each were explained to the (patient/caregiver).  Consent for procedure obtained. Time Out: Verified patient identification, verified procedure, site/side was marked, verified correct patient position, special equipment/implants available, medications/allergies/relevent history reviewed, required imaging and test results available.  Performed  Maximum sterile technique was used including antiseptics, cap, gloves, gown, hand hygiene, mask and sheet.  3    Evaluation Hemodynamic Status: BP stable throughout; O2 sats: stable throughout Patient's Current Condition: stable Complications: No apparent complications Patient did tolerate procedure well. Chest X-ray ordered to verify placement.  CXR: pending.   Sabrina Baker A Sabrina Baker 09/09/2018

## 2018-09-09 NOTE — ED Notes (Signed)
Patient placed on a non-rebreather.

## 2018-09-09 NOTE — Progress Notes (Signed)
Inpatient Diabetes Program Recommendations  AACE/ADA: New Consensus Statement on Inpatient Glycemic Control (2015)  Target Ranges:  Prepandial:   less than 140 mg/dL      Peak postprandial:   less than 180 mg/dL (1-2 hours)      Critically ill patients:  140 - 180 mg/dL   Lab Results  Component Value Date   GLUCAP 91 01/24/2016   HGBA1C 7.3 (H) 04/19/2012    Review of Glycemic Control Results for Sabrina Baker, Sabrina Baker (MRN [id number]) as of 09/09/2018 15:08  Ref. Range 09/09/2018 10:02  Glucose Latest Ref Range: 70 - 99 mg/dL 356 (H)   Diabetes history: DM 2 Outpatient Diabetes medications: Toujeo 20 units q AM, Actos 15 mg daily, Trulicity 1.5 weekly Current orders for Inpatient glycemic control:  Novolog moderate q 4 hours Inpatient Diabetes Program Recommendations:   Note patient intubated.  If appropriate, consider adding Levemir 5 units bid (this is 1/2 of home dose) in an effort to improve blood sugar control with basal insulin.    Thanks,  Adah Perl, RN, BC-ADM Inpatient Diabetes Coordinator Pager [phone] (8a-5p)

## 2018-09-09 NOTE — ED Triage Notes (Signed)
Pt arrives EMS from home with c/o abdominal pain and coughx 2 weeks with diarrhea and occasional SHOB. Pt state unable to retain fluids and increasing weakness. EMS report crackles in upper right field.

## 2018-09-09 NOTE — ED Notes (Signed)
ED TO INPATIENT HANDOFF REPORT  ED Nurse Name and Phone #: [place] Mam Name/Age/Gender Sabrina Baker 78 y.o. female Room/Bed: 022C/022C  Code Status   Code Status: Full Code  Home/SNF/Other Home Patient oriented to: self, place, time and situation Is this baseline? Yes   Triage Complete: Triage complete  Chief Complaint sick  Triage Note Pt arrives EMS from home with c/o abdominal pain and coughx 2 weeks with diarrhea and occasional SHOB. Pt state unable to retain fluids and increasing weakness. EMS report crackles in upper right field.    Allergies Allergies  Allergen Reactions  . Altace [Ramipril] Anaphylaxis  . Diovan [Valsartan] Other (See Comments)    Hyperkalemia   . Niacin And Related Diarrhea  . Niacin Diarrhea    And nausea    Level of Care/Admitting Diagnosis ED Disposition    ED Disposition Condition [facility] [facility] Area: [place] [[id number]]  Level of Care: ICU [6]  Diagnosis: Acute respiratory failure with hypoxia [facility]) [[id number]]  Admitting Physician: Laurin Coder [[id number]]  Attending Physician: Laurin Coder [[id number]]  Estimated length of stay: 3 - 4 days  Certification:: I certify this patient will need inpatient services for at least 2 midnights  Bed request comments: COVID r/o; 71M bed  PT Class (Do Not Modify): Inpatient [101]  PT Acc Code (Do Not Modify): Private [1]       B Medical/Surgery History Past Medical History:  Diagnosis Date  . Chronic kidney disease    stage 3  . Diabetes mellitus without complication ([place])   . Hypercholesteremia   . Hypertension   . Neuromuscular disorder (HCC)    neuropathy  . Osteopenia   . Retinal detachment   . TIA (transient ischemic attack)   . Vitamin B12 deficiency    Past Surgical History:  Procedure Laterality Date  . [place] VITRECTOMY WITH 20 GAUGE MVR PORT Right 03/16/2015   Procedure: 25 GAUGE PARS PLANA VITRECTOMY WITH 23 GAUGE MVR  PORT;  Surgeon: Hayden Pedro, MD;  Location: [place];  Service: Ophthalmology;  Laterality: Right;  . ANKLE SURGERY    . BREAST EXCISIONAL BIOPSY    . BREAST LUMPECTOMY WITH RADIOACTIVE SEED LOCALIZATION Right 01/24/2016   Procedure: RIGHT BREAST LUMPECTOMY WITH RADIOACTIVE SEED LOCALIZATION;  Surgeon: Coralie Keens, MD;  Location: [place];  Service: General;  Laterality: Right;  . EYE SURGERY     detached retna X2  . HEEL SPUR SURGERY  2012  . INJECTION OF SILICONE OIL Right 29/02/2425   Procedure: Extraction OF SILICONE OIL;  Surgeon: Hayden Pedro, MD;  Location: [place];  Service: Ophthalmology;  Laterality: Right;  . ORIF ANKLE FRACTURE Left 06/17/2014   Procedure: OPEN REDUCTION INTERNAL FIXATION (ORIF) LEFT BIMALLEOLAR ANKLE FRACTURE;  Surgeon: Marianna Payment, MD;  Location: [place];  Service: Orthopedics;  Laterality: Left;  . PHOTOCOAGULATION WITH LASER Right 03/16/2015   Procedure: PHOTOCOAGULATION WITH LASER;  Surgeon: Hayden Pedro, MD;  Location: [place];  Service: Ophthalmology;  Laterality: Right;  . SHOULDER ARTHROSCOPY Right    plates and screws  . VITRECTOMY Right 03/16/2015  . WRIST ARTHROPLASTY Left    plates and screws and bone graft     A IV Location/Drains/Wounds Patient Lines/Drains/Airways Status   Active Line/Drains/Airways    Name:   Placement date:   Placement time:   Site:   Days:   Peripheral IV 09/09/18 Right Antecubital  09/09/18    0959    Antecubital   less than 1   Peripheral IV 09/09/18 Left Antecubital   09/09/18    1157    Antecubital   less than 1   Incision (Closed) 06/17/14 Ankle Left   06/17/14    1525     1545   Incision (Closed) 03/16/15 Eye Right   03/16/15    1225     1273   Incision (Closed) 01/24/16 Breast Right   01/24/16    1359     959          Intake/Output Last 24 hours No intake or output data in the 24 hours ending 09/09/18 1337  Labs/Imaging Results for orders placed or performed during the hospital  encounter of 09/09/18 (from the past 48 hour(s))  Comprehensive metabolic panel     Status: Abnormal   Collection Time: 09/09/18 10:02 AM  Result Value Ref Range   Sodium 131 (L) 135 - 145 mmol/L   Potassium 5.5 (H) 3.5 - 5.1 mmol/L   Chloride 97 (L) 98 - 111 mmol/L   CO2 18 (L) 22 - 32 mmol/L   Glucose, Bld 356 (H) 70 - 99 mg/dL   BUN 53 (H) 8 - 23 mg/dL   Creatinine, Ser 2.44 (H) 0.44 - 1.00 mg/dL   Calcium 8.4 (L) 8.9 - 10.3 mg/dL   Total Protein 7.0 6.5 - 8.1 g/dL   Albumin 2.7 (L) 3.5 - 5.0 g/dL   AST 97 (H) 15 - 41 U/L   ALT 29 0 - 44 U/L   Alkaline Phosphatase 35 (L) 38 - 126 U/L   Total Bilirubin 0.8 0.3 - 1.2 mg/dL   GFR calc non Af Amer 18 (L) >60 mL/min   GFR calc Af Amer 21 (L) >60 mL/min   Anion gap 16 (H) 5 - 15    Comment: Performed at [facility], [address], [place], [place] [address]  CBC WITH DIFFERENTIAL     Status: Abnormal   Collection Time: 09/09/18 10:02 AM  Result Value Ref Range   WBC 14.4 (H) 4.0 - 10.5 K/uL   RBC 4.09 3.87 - 5.11 MIL/uL   Hemoglobin 12.6 12.0 - 15.0 g/dL   HCT 39.4 36.0 - 46.0 %   MCV 96.3 80.0 - 100.0 fL   MCH 30.8 26.0 - 34.0 pg   MCHC 32.0 30.0 - 36.0 g/dL   RDW 14.1 11.5 - 15.5 %   Platelets 292 150 - 400 K/uL   nRBC 0.0 0.0 - 0.2 %   Neutrophils Relative % 88 %   Neutro Abs 12.7 (H) 1.7 - 7.7 K/uL   Lymphocytes Relative 7 %   Lymphs Abs 1.0 0.7 - 4.0 K/uL   Monocytes Relative 4 %   Monocytes Absolute 0.5 0.1 - 1.0 K/uL   Eosinophils Relative 0 %   Eosinophils Absolute 0.0 0.0 - 0.5 K/uL   Basophils Relative 0 %   Basophils Absolute 0.0 0.0 - 0.1 K/uL   Immature Granulocytes 1 %   Abs Immature Granulocytes 0.12 (H) 0.00 - 0.07 K/uL    Comment: Performed at [facility] [address], [place], [place] [address]  Brain natriuretic peptide     Status: Abnormal   Collection Time: 09/09/18 10:02 AM  Result Value Ref Range   B Natriuretic Peptide 295.2 (H) 0.0 - 100.0 pg/mL    Comment: Performed at [facility] [address], [place], [place]  [address]  Troponin I - ONCE - STAT     Status: Abnormal   Collection Time: 09/09/18 10:02 AM  Result Value Ref Range   Troponin I 0.33 (HH) <0.03 ng/mL    Comment: CRITICAL RESULT CALLED TO, READ BACK BY AND VERIFIED WITH: Benard Halsted RN [id number] BY A BENNETT Performed at [facility], [place] [address], [place], [place] [address]   Lactic acid, plasma     Status: None   Collection Time: 09/09/18 10:05 AM  Result Value Ref Range   Lactic Acid, Venous 1.6 0.5 - 1.9 mmol/L    Comment: Performed at [facility] [address] [place], [place] [address]  Urinalysis, Routine w reflex microscopic     Status: Abnormal   Collection Time: 09/09/18 11:30 AM  Result Value Ref Range   Color, Urine AMBER (A) YELLOW    Comment: BIOCHEMICALS MAY BE AFFECTED BY COLOR   APPearance HAZY (A) CLEAR   Specific Gravity, Urine 1.017 1.005 - 1.030   pH 5.0 5.0 - 8.0   Glucose, UA NEGATIVE NEGATIVE mg/dL   Hgb urine dipstick MODERATE (A) NEGATIVE   Bilirubin Urine NEGATIVE NEGATIVE   Ketones, ur NEGATIVE NEGATIVE mg/dL   Protein, ur 30 (A) NEGATIVE mg/dL   Nitrite NEGATIVE NEGATIVE   Leukocytes,Ua NEGATIVE NEGATIVE   RBC / HPF 0-5 0 - 5 RBC/hpf   WBC, UA 0-5 0 - 5 WBC/hpf   Bacteria, UA RARE (A) NONE SEEN   Squamous Epithelial / LPF 0-5 0 - 5   Mucus PRESENT    Hyaline Casts, UA PRESENT     Comment: Performed at [facility], [address], [place],  [address]  POCT I-Stat EG7     Status: Abnormal   Collection Time: 09/09/18  1:15 PM  Result Value Ref Range   pH, Ven 7.392 7.250 - 7.430   pCO2, Ven 35.0 (L) 44.0 - 60.0 mmHg   pO2, Ven 112.0 (H) 32.0 - 45.0 mmHg   Bicarbonate 21.3 20.0 - 28.0 mmol/L   TCO2 22 22 - 32 mmol/L   O2 Saturation 98.0 %   Acid-base deficit 3.0 (H) 0.0 - 2.0 mmol/L   Sodium 132 (L) 135 - 145 mmol/L   Potassium 4.6 3.5 - 5.1 mmol/L   Calcium, Ion 1.01 (L) 1.15 - 1.40 mmol/L   HCT 34.0 (L) 36.0 -  46.0 %   Hemoglobin 11.6 (L) 12.0 - 15.0 g/dL   Patient temperature HIDE    Sample type VENOUS    Dg Chest Port 1 View  Result Date: 09/09/2018 CLINICAL DATA:  Short of breath, cough.  Diarrhea. EXAM: PORTABLE CHEST 1 VIEW COMPARISON:  Chest radiograph 05/29/2012 FINDINGS: Normal cardiac silhouette. There is bilateral fine airspace disease in the upper lobes. No pleural fluid. No pneumothorax. No acute osseous abnormality. RIGHT shoulder internal fixation. IMPRESSION: Bilateral upper lobe airspace disease representing multifocal pneumonia versus pulmonary edema. Electronically Signed   By: Suzy Bouchard M.D.   On: 09/09/2018 11:27    Pending Labs Unresulted Labs (From admission, onward)    Start     Ordered   09/09/18 1330  Culture, respiratory (tracheal aspirate)  Once,   R     09/09/18 1329   09/09/18 1329  Respiratory Panel by PCR  (Pediatric Respiratory Virus Panel w droplet and contact precautions)  Once,   R     09/09/18 1328   09/09/18 1321  Novel Coronavirus, NAA (hospital order; send-out to ref  lab)  (Novel Coronavirus, NAA [facility] Order; send-out to ref lab) with precautions panel)  ONCE - STAT,   R    Question Answer Comment  Current symptoms Fever and Cough   Excluded other viral illnesses No (testing not indicated)      09/09/18 1320   09/09/18 1002  Culture, blood (routine x 2)  BLOOD CULTURE X 2,   STAT     09/09/18 1001          Vitals/Pain Today's Vitals   09/09/18 1212 09/09/18 1230 09/09/18 1300 09/09/18 1308  BP:  124/83 138/70   Pulse:  77 79   Resp:  17 18   Temp:      TempSrc:      SpO2:  92% (!) 82% 97%  Weight:      Height:      PainSc: 0-No pain       Isolation Precautions Droplet and Contact precautions  Medications Medications  azithromycin (ZITHROMAX) 500 mg in sodium chloride 0.9 % 250 mL IVPB (500 mg Intravenous New Bag/Given 09/09/18 1251)  heparin injection 5,000 Units (has no administration in time range)  pantoprazole  (PROTONIX) injection 40 mg (has no administration in time range)  chlorhexidine gluconate (MEDLINE KIT) (PERIDEX) 0.12 % solution 15 mL (has no administration in time range)  MEDLINE mouth rinse (has no administration in time range)  midazolam (VERSED) injection 1 mg (has no administration in time range)  midazolam (VERSED) injection 1 mg (has no administration in time range)  docusate (COLACE) 50 MG/5ML liquid 100 mg (has no administration in time range)  bisacodyl (DULCOLAX) suppository 10 mg (has no administration in time range)  cefTRIAXone (ROCEPHIN) 1 g in sodium chloride 0.9 % 100 mL IVPB (0 g Intravenous Stopped 09/09/18 1248)  aspirin chewable tablet 324 mg (324 mg Oral Given 09/09/18 1251)  albuterol (PROVENTIL HFA;VENTOLIN HFA) 108 (90 Base) MCG/ACT inhaler 4 puff (4 puffs Inhalation Given 09/09/18 1310)    Mobility walks with device Moderate fall risk   Focused Assessments Pulmonary Assessment Handoff:  Lung sounds:   O2 Device: NRB O2 Flow Rate (L/min): 15 L/min      R Recommendations: See Admitting Provider Note  Report given to:   Additional Notes: Critical care ready to intubate in negative pressure room when pt on unit.

## 2018-09-09 NOTE — Plan of Care (Signed)
  Problem: Education: Goal: Knowledge of General Education information will improve Description Including pain rating scale, medication(s)/side effects and non-pharmacologic comfort measures Outcome: Progressing Pts daughter (POA) was updated on the shifts POC for pt and pts current status. All questions were answered

## 2018-09-09 NOTE — H&P (Signed)
NAME:  Sabrina Baker, MRN:  [id number], DOB:  04-Jan-1941, LOS: 0 ADMISSION DATE:  09/09/2018, CONSULTATION DATE:  09/09/18 REFERRING MD:  Regenia Skeeter - EM, CHIEF COMPLAINT:  SOB, diarrhea    Brief History   61 yoF presenting from home with c/o of cough, SOB, and abdominal pain x 2 weeks. Progressive hypoxia in ER now on NRB and CXR with bilateral infiltrates    History of present illness   78 year old female with history of HTN, HLD, DM, CKD stage 3, TIA, and neuropathy presenting with two history of generalized weakness, cough, shortness of breath (progressive over 1 week) and diarrhea.   She from home, lives alone.  Denies exposure to sick contact including COVID-19 or travel history.   She presented by EMS.  In ER, vitals noted for temp 99, normotensive and with rapidly increasing oxygen requirements in ER now on NRB.   Labs noted for Na 131, K 5.5, CO2 18, glucose 356, BUN 53, sCr 2.44 (baselien 1.99), BNP 295, Trop 0.33, WBC 14.4, Hgb 12.6,   Lactic acid 1.6, UA neg, EKG non-acute.  CXR noted for bilateral infiltrates.  Pending RVP and COVID-19 testing sent.  PCCM called for admit.   Past Medical History     CKD stage 3, DM, HLD, HTN, neuropathy, osteopenia, retinal detachment, TIA  Significant Hospital Events   3/30> admitted, intubation pending   Consults:    Procedures:  3/30 ETT >>   Significant Diagnostic Tests:  3/30 CXR >> Bilateral upper lobe airspace disease representing multifocal pneumonia versus pulmonary edema.   Micro Data:  3/30 BC x 2 >> 3/30 RVP >> 3/30 COVID-19 >> 3/30 Trach asp >>  3/30 urine strep >> 3/30 urine legionella >>  Antimicrobials:  3/30 azithro >> 3/30 ceftriaxone >>  Interim history/subjective:   Progressive SOB, increasing O2 requirement.  HDS, on non-rebreather  Objective   Blood pressure 138/70, pulse 79, temperature 99 F (37.2 C), temperature source Oral, resp. rate 18, height 5\' 3"  (1.6 m), weight 78 kg, SpO2 97 %.        No intake or output data in the 24 hours ending 09/09/18 1329 Filed Weights   09/09/18 1004  Weight: 78 kg    Examination: General: WDWN adult female, on non-rebreather in mild respiratory distress  HENT: NCAT pink mmm trachea midline. On non-rebreather.  Lungs: Symmetrical chest expansion, no accessory muscle recruitment, diminished lung sounds    Cardiovascular: RRR s1s2 no r/g/m 2+ radial pulses  Abdomen: soft, round, ndnt, bowel sounds x4, no tenderness to palpation  Extremities: Symmetrical bulk and tone. No obvious deformity.  No edema  Neuro: AAOx4, following commands, moves BUE BLE spontaneously  Skin: clean, dry, intact no rash   Resolved Hospital Problem list     Assessment & Plan:   Acute Respiratory Failure with Hypoxia -rapidly increasing oxygen requirement -bilateral infiltrates on CXR (multifocal PNA vs pulm edema) -Concern for COVID, via community spread. No significant hx of travel, sick contacts -BNP slightly elevated to 295 P Intubate patient ABG 1 hours after intubation CXR after intubation AM CXR Follow up RVP Follow up COVID-19  Lasix 40mg  x1 Azithromycin/Ceftriaxone for  ? CAP  Trend WBC, Temperaure  CKD stage III P Monitor BMP Will be receiving 1x Lasix 3/30 for possible fluid overload component of respiratory failure as above  Strict I/O  Elevated troponin Trop: 0.33 BNP 295 demand related in setting of hypoxia vs ACS P Trend troponin Get ECG  HTN Home norvasc  P Continue telemetry  Holding home medications at this time until after intubated and can assess hemodynamic status while sedated    DM2  -acute hyperglycemia  -qHS gabapentin for diabetic neuropathy  P SSI  Follow up with DM coordinator recommendations when finalized   Hx Depression Hold Cymbalta  Hx HLD Hold atorvastatin  Disposition: Patient with rapidly increasing oxygen requirement Plan to admit to ICU, intubate upon arrival to ICU  Best practice:   Diet: NPO  Pain/Anxiety/Delirium protocol (if indicated): Will be PRN fentanyl  VAP protocol (if indicated): start after intubation  DVT prophylaxis: SQ Heparin  GI prophylaxis: protonix  Glucose control: SSI  Mobility: bedrest Code Status: Full  Family Communication: none  Disposition: admit to ICU   Labs   CBC: Recent Labs  Lab 09/09/18 1002 09/09/18 1315  WBC 14.4*  --   NEUTROABS 12.7*  --   HGB 12.6 11.6*  HCT 39.4 34.0*  MCV 96.3  --   PLT 292  --     Basic Metabolic Panel: Recent Labs  Lab 09/09/18 1002 09/09/18 1315  NA 131* 132*  K 5.5* 4.6  CL 97*  --   CO2 18*  --   GLUCOSE 356*  --   BUN 53*  --   CREATININE 2.44*  --   CALCIUM 8.4*  --    GFR: Estimated Creatinine Clearance: 19.1 mL/min (A) (by C-G formula based on SCr of 2.44 mg/dL (H)). Recent Labs  Lab 09/09/18 1002 09/09/18 1005  WBC 14.4*  --   LATICACIDVEN  --  1.6    Liver Function Tests: Recent Labs  Lab 09/09/18 1002  AST 97*  ALT 29  ALKPHOS 35*  BILITOT 0.8  PROT 7.0  ALBUMIN 2.7*   No results for input(s): LIPASE, AMYLASE in the last 168 hours. No results for input(s): AMMONIA in the last 168 hours.  ABG    Component Value Date/Time   HCO3 21.3 09/09/2018 1315   TCO2 22 09/09/2018 1315   ACIDBASEDEF 3.0 (H) 09/09/2018 1315   O2SAT 98.0 09/09/2018 1315     Coagulation Profile: No results for input(s): INR, PROTIME in the last 168 hours.  Cardiac Enzymes: Recent Labs  Lab 09/09/18 1002  TROPONINI 0.33*    HbA1C: Hgb A1c MFr Bld  Date/Time Value Ref Range Status  04/19/2012 03:43 PM 7.3 (H) <5.7 % Final    Comment:    (NOTE)                                                                       According to the ADA Clinical Practice Recommendations for 2011, when HbA1c is used as a screening test:  >=6.5%   Diagnostic of Diabetes Mellitus           (if abnormal result is confirmed) 5.7-6.4%   Increased risk of developing Diabetes Mellitus  References:Diagnosis and Classification of Diabetes Mellitus,Diabetes NIOE,7035,00(XFGHW 1):S62-S69 and Standards of Medical Care in         Diabetes - 2011,Diabetes EXHB,7169,67 (Suppl 1):S11-S61.  04/18/2012 09:20 AM 7.3 (H) <5.7 % Final    Comment:    (NOTE)  According to the ADA Clinical Practice Recommendations for 2011, when HbA1c is used as a screening test:  >=6.5%   Diagnostic of Diabetes Mellitus           (if abnormal result is confirmed) 5.7-6.4%   Increased risk of developing Diabetes Mellitus References:Diagnosis and Classification of Diabetes Mellitus,Diabetes XBLT,9030,09(QZRAQ 1):S62-S69 and Standards of Medical Care in         Diabetes - 2011,Diabetes TMAU,6333,54 (Suppl 1):S11-S61.    CBG: No results for input(s): GLUCAP in the last 168 hours.  Review of Systems:   Pertinent positives as noted in HPI  Past Medical History  She,  has a past medical history of Chronic kidney disease, Diabetes mellitus without complication ([place]), Hypercholesteremia, Hypertension, Neuromuscular disorder ([place]), Osteopenia, Retinal detachment, TIA (transient ischemic attack), and Vitamin B12 deficiency.   Surgical History    Past Surgical History:  Procedure Laterality Date  . [place] VITRECTOMY WITH 20 GAUGE MVR PORT Right 03/16/2015   Procedure: 25 GAUGE PARS PLANA VITRECTOMY WITH 23 GAUGE MVR PORT;  Surgeon: Hayden Pedro, MD;  Location: [place];  Service: Ophthalmology;  Laterality: Right;  . ANKLE SURGERY    . BREAST EXCISIONAL BIOPSY    . BREAST LUMPECTOMY WITH RADIOACTIVE SEED LOCALIZATION Right 01/24/2016   Procedure: RIGHT BREAST LUMPECTOMY WITH RADIOACTIVE SEED LOCALIZATION;  Surgeon: Coralie Keens, MD;  Location: [place];  Service: General;  Laterality: Right;  . EYE SURGERY     detached retna X2  . HEEL SPUR SURGERY  2012  . INJECTION OF SILICONE OIL Right 56/07/5636    Procedure: Extraction OF SILICONE OIL;  Surgeon: Hayden Pedro, MD;  Location: [place];  Service: Ophthalmology;  Laterality: Right;  . ORIF ANKLE FRACTURE Left 06/17/2014   Procedure: OPEN REDUCTION INTERNAL FIXATION (ORIF) LEFT BIMALLEOLAR ANKLE FRACTURE;  Surgeon: Marianna Payment, MD;  Location: [place];  Service: Orthopedics;  Laterality: Left;  . PHOTOCOAGULATION WITH LASER Right 03/16/2015   Procedure: PHOTOCOAGULATION WITH LASER;  Surgeon: Hayden Pedro, MD;  Location: [place];  Service: Ophthalmology;  Laterality: Right;  . SHOULDER ARTHROSCOPY Right    plates and screws  . VITRECTOMY Right 03/16/2015  . WRIST ARTHROPLASTY Left    plates and screws and bone graft     Social History   reports that she has never smoked. She has never used smokeless tobacco. She reports that she does not drink alcohol or use drugs.   Family History   Her family history is not on file.   Allergies Allergies  Allergen Reactions  . Altace [Ramipril] Anaphylaxis  . Diovan [Valsartan] Other (See Comments)    Hyperkalemia   . Niacin And Related Diarrhea  . Niacin Diarrhea    And nausea     Home Medications  Prior to Admission medications   Medication Sig Start Date End Date Taking? Authorizing Provider  amLODipine (NORVASC) 5 MG tablet Take 5 mg by mouth daily. 06/17/18   [provider]  atorvastatin (LIPITOR) 10 MG tablet Take 10 mg by mouth daily.    [provider]  calcium-vitamin D (OSCAL WITH D) 500-200 MG-UNIT per tablet Take 1 tablet by mouth 2 (two) times daily.     [provider]  carvedilol (COREG) 25 MG tablet Take 25 mg by mouth 2 (two) times daily with a meal.  05/04/14   [provider]  cholecalciferol (VITAMIN D) 1000 UNITS tablet Take 1,000 Units by mouth daily.    [provider]  colchicine 0.6 MG tablet Take 0.6 mg by mouth daily as needed (gout). Started 04/07/13    [provider]  DULoxetine (CYMBALTA) 60 MG capsule Take  by mouth daily. 07/03/18   [provider]  Ferrous Sulfate (IRON) 325 (65 FE) MG TABS Take 1 tablet by mouth 3 (three) times daily.    [provider]  gabapentin (NEURONTIN) 100 MG capsule TAKE 1 (ONE) CAPSULE AT BEDTIME 06/09/17   [provider]  Insulin Glargine (TOUJEO SOLOSTAR [place]) Inject 20 Units into the skin every morning.    [provider]  Insulin Pen Needle (FIFTY50 PEN NEEDLES) 32G X 4 MM MISC USE TO INJECT INSULIN ONCE A DAY 04/05/18   [provider]  Lancets 30G MISC Use to test blood sugars 3 times a day (Dx: E11.65)    [provider]  latanoprost (XALATAN) 0.005 % ophthalmic solution INSTILL 1 DROP INTO LEFT EYE NIGHTLY. 05/20/17   [provider]  mupirocin ointment (BACTROBAN) 2 % Place 1 application into the nose 2 (two) times daily.    [provider]  pantoprazole (PROTONIX) 40 MG tablet Take 40 mg by mouth 2 (two) times daily.    [provider]  pioglitazone (ACTOS) 30 MG tablet Take 15 mg by mouth every evening.     [provider]  triamcinolone cream (KENALOG) 0.1 % Apply topically. 01/09/17   [provider]  TRULICITY 1.5 ZO/1.0RU SOPN Inject 1.5 mg into the skin once a week. mondays 05/28/14   [provider]  vitamin B-12 (CYANOCOBALAMIN) 1000 MCG tablet Take 1 tablet once a day    [provider]     Critical care time: 68 minutes     Eliseo Gum MSN, AGACNP-BC [place] [phone] If no answer, [phone] 09/09/2018, 3:26 PM

## 2018-09-10 ENCOUNTER — Inpatient Hospital Stay: Payer: Self-pay

## 2018-09-10 ENCOUNTER — Inpatient Hospital Stay (HOSPITAL_COMMUNITY): Payer: Medicare Other

## 2018-09-10 DIAGNOSIS — G9340 Encephalopathy, unspecified: Secondary | ICD-10-CM

## 2018-09-10 DIAGNOSIS — J80 Acute respiratory distress syndrome: Secondary | ICD-10-CM

## 2018-09-10 LAB — GLUCOSE, CAPILLARY
GLUCOSE-CAPILLARY: 97 mg/dL (ref 70–99)
Glucose-Capillary: 56 mg/dL — ABNORMAL LOW (ref 70–99)
Glucose-Capillary: 98 mg/dL (ref 70–99)
Glucose-Capillary: 189 mg/dL — ABNORMAL HIGH (ref 70–99)
Glucose-Capillary: 255 mg/dL — ABNORMAL HIGH (ref 70–99)
Glucose-Capillary: 125 mg/dL — ABNORMAL HIGH (ref 70–99)

## 2018-09-10 LAB — RENAL FUNCTION PANEL
Albumin: 2.4 g/dL — ABNORMAL LOW (ref 3.5–5.0)
Anion gap: 11 (ref 5–15)
BUN: 51 mg/dL — ABNORMAL HIGH (ref 8–23)
CO2: 25 mmol/L (ref 22–32)
Calcium: 8.5 mg/dL — ABNORMAL LOW (ref 8.9–10.3)
Chloride: 103 mmol/L (ref 98–111)
Creatinine, Ser: 1.95 mg/dL — ABNORMAL HIGH (ref 0.44–1.00)
GFR calc Af Amer: 28 mL/min — ABNORMAL LOW (ref >60)
GFR calc non Af Amer: 24 mL/min — ABNORMAL LOW (ref >60)
Glucose, Bld: 188 mg/dL — ABNORMAL HIGH (ref 70–99)
Phosphorus: 2.8 mg/dL (ref 2.5–4.6)
Potassium: 5 mmol/L (ref 3.5–5.1)
Sodium: 139 mmol/L (ref 135–145)

## 2018-09-10 LAB — CBC
HCT: 40.4 % (ref 36.0–46.0)
Hemoglobin: 12.6 g/dL (ref 12.0–15.0)
MCH: 29.6 pg (ref 26.0–34.0)
MCHC: 31.2 g/dL (ref 30.0–36.0)
MCV: 94.8 fL (ref 80.0–100.0)
Platelets: 261 10*3/uL (ref 150–400)
RBC: 4.26 MIL/uL (ref 3.87–5.11)
RDW: 13.9 % (ref 11.5–15.5)
WBC: 16 10*3/uL — AB (ref 4.0–10.5)
nRBC: 0 % (ref 0.0–0.2)

## 2018-09-10 LAB — NOVEL CORONAVIRUS, NAA (HOSP ORDER, SEND-OUT TO REF LAB; TAT 18-24 HRS): SARS-CoV-2, NAA: DETECTED — AB

## 2018-09-10 LAB — LEGIONELLA PNEUMOPHILA SEROGP 1 UR AG: L. PNEUMOPHILA SEROGP 1 UR AG: NEGATIVE

## 2018-09-10 MED ORDER — ZINC SULFATE 220 (50 ZN) MG PO CAPS
220.0000 mg | ORAL_CAPSULE | Freq: Every day | ORAL | Status: AC
  Administered 2018-09-11 – 2018-09-13 (×4): 220 mg
  Filled 2018-09-10 (×4): qty 1

## 2018-09-10 MED ORDER — DEXTROSE 10 % IV SOLN
INTRAVENOUS | Status: DC
  Administered 2018-09-10: 11:00:00 via INTRAVENOUS

## 2018-09-10 MED ORDER — HYDROXYCHLOROQUINE SULFATE 200 MG PO TABS
200.0000 mg | ORAL_TABLET | Freq: Two times a day (BID) | ORAL | Status: AC
  Administered 2018-09-11 – 2018-09-15 (×8): 200 mg
  Filled 2018-09-10 (×9): qty 1

## 2018-09-10 MED ORDER — FENTANYL CITRATE (PF) 100 MCG/2ML IJ SOLN: 50.0000 ug | Freq: Once | INTRAMUSCULAR | Status: DC

## 2018-09-10 MED ORDER — PANTOPRAZOLE SODIUM 40 MG PO PACK
40.0000 mg | PACK | Freq: Every day | ORAL | Status: DC
  Administered 2018-09-10 – 2018-09-29 (×20): 40 mg
  Filled 2018-09-10 (×24): qty 20

## 2018-09-10 MED ORDER — ZINC SULFATE 220 (50 ZN) MG PO CAPS
220.0000 mg | ORAL_CAPSULE | Freq: Every day | ORAL | Status: DC
  Filled 2018-09-10: qty 1

## 2018-09-10 MED ORDER — SODIUM CHLORIDE 0.9% FLUSH: 10.0000 mL | INTRAVENOUS | Status: DC | PRN

## 2018-09-10 MED ORDER — HYDROXYCHLOROQUINE SULFATE 200 MG PO TABS
400.0000 mg | ORAL_TABLET | Freq: Two times a day (BID) | ORAL | Status: AC
  Administered 2018-09-11 (×2): 400 mg
  Filled 2018-09-10 (×2): qty 2

## 2018-09-10 MED ORDER — VITAL HIGH PROTEIN PO LIQD
1000.0000 mL | ORAL | Status: DC
  Administered 2018-09-10 – 2018-09-12 (×3): 1000 mL

## 2018-09-10 MED ORDER — HYDROXYCHLOROQUINE SULFATE 200 MG PO TABS: 200.0000 mg | ORAL_TABLET | Freq: Two times a day (BID) | ORAL | Status: DC

## 2018-09-10 MED ORDER — HYDROXYCHLOROQUINE SULFATE 200 MG PO TABS
400.0000 mg | ORAL_TABLET | Freq: Two times a day (BID) | ORAL | Status: DC
  Filled 2018-09-10: qty 2

## 2018-09-10 MED ORDER — DEXTROSE 50 % IV SOLN
INTRAVENOUS | Status: AC
  Administered 2018-09-10: 25 mL
  Filled 2018-09-10: qty 50

## 2018-09-10 MED ORDER — FENTANYL 2500MCG IN NS 250ML (10MCG/ML) PREMIX INFUSION
25.0000 ug/h | INTRAVENOUS | Status: DC
  Administered 2018-09-10: 100 ug/h via INTRAVENOUS
  Administered 2018-09-12: 25 ug/h via INTRAVENOUS
  Administered 2018-09-14: 150 ug/h via INTRAVENOUS
  Administered 2018-09-15: 125 ug/h via INTRAVENOUS
  Administered 2018-09-16: 150 ug/h via INTRAVENOUS
  Administered 2018-09-17 – 2018-09-18 (×3): 200 ug/h via INTRAVENOUS
  Filled 2018-09-10 (×10): qty 250

## 2018-09-10 MED ORDER — CHLORHEXIDINE GLUCONATE CLOTH 2 % EX PADS
6.0000 | MEDICATED_PAD | Freq: Every day | CUTANEOUS | Status: DC
  Administered 2018-09-10 – 2018-09-12 (×3): 6 via TOPICAL

## 2018-09-10 MED ORDER — SODIUM CHLORIDE 0.9% FLUSH
10.0000 mL | Freq: Two times a day (BID) | INTRAVENOUS | Status: DC
  Administered 2018-09-10: 30 mL
  Administered 2018-09-11 – 2018-09-14 (×6): 10 mL
  Administered 2018-09-14 – 2018-09-15 (×2): 30 mL
  Administered 2018-09-15 – 2018-09-16 (×2): 10 mL
  Administered 2018-09-17: 09:00:00 20 mL
  Administered 2018-09-17 – 2018-09-18 (×3): 10 mL
  Administered 2018-09-20: 09:00:00 30 mL
  Administered 2018-09-20 – 2018-09-21 (×3): 10 mL
  Administered 2018-09-22: 40 mL
  Administered 2018-09-22 – 2018-09-24 (×3): 10 mL
  Administered 2018-09-24: 20 mL
  Administered 2018-09-25: 10 mL
  Administered 2018-09-26: 11:00:00 20 mL
  Administered 2018-09-26 – 2018-10-04 (×15): 10 mL
  Administered 2018-10-05: 20 mL
  Administered 2018-10-06 – 2018-10-11 (×9): 10 mL

## 2018-09-10 MED ORDER — FENTANYL BOLUS VIA INFUSION
50.0000 ug | INTRAVENOUS | Status: DC | PRN
  Filled 2018-09-10: qty 50

## 2018-09-10 MED ORDER — PRO-STAT SUGAR FREE PO LIQD
30.0000 mL | Freq: Every day | ORAL | Status: DC
  Administered 2018-09-10 – 2018-09-12 (×3): 30 mL
  Filled 2018-09-10 (×3): qty 30

## 2018-09-10 MED ORDER — MIDAZOLAM HCL 2 MG/2ML IJ SOLN
1.0000 mg | INTRAMUSCULAR | Status: DC | PRN
  Administered 2018-09-12 – 2018-09-18 (×16): 1 mg via INTRAVENOUS
  Filled 2018-09-10 (×16): qty 2

## 2018-09-10 NOTE — Progress Notes (Signed)
eLink Physician-Brief Progress Note Patient Name: Sabrina Baker DOB: [birth year] MRN: [id number]   Date of Service  09/10/2018  HPI/Events of Note  Called by bedside nurse and informed the patient is COVID-19 positive. Patient is already on Azithromycin.   eICU Interventions  Will order: 1. Place OGT 1. Hydroxychloroquine 400 mg per tube now and  BID X 2 doses, then 200 mg per tube BID X 8 doses.  2. Zinc Sulfate 220 mg per tube now and Q day X 5 days.      Intervention Category Major Interventions: Infection - evaluation and management  Sommer,Steven Eugene 09/10/2018, 11:09 PM

## 2018-09-10 NOTE — Progress Notes (Addendum)
NAME:  Sabrina Baker, MRN:  [id number], DOB:  1941/01/27, LOS: 1 ADMISSION DATE:  09/09/2018, CONSULTATION DATE:  09/09/18 REFERRING MD:  Regenia Skeeter - EM, CHIEF COMPLAINT:  SOB, diarrhea    Brief History   65 yoF presenting from home with c/o of cough, SOB, and abdominal pain x 2 weeks. Progressive hypoxia in ER now on NRB and CXR with bilateral infiltrates    History of present illness   78 year old female with history of HTN, HLD, DM, CKD stage 3, TIA, and neuropathy presenting with two history of generalized weakness, cough, shortness of breath (progressive over 1 week) and diarrhea.   She from home, lives alone.  Denies exposure to sick contact including COVID-19 or travel history.   She presented by EMS.  In ER, vitals noted for temp 99, normotensive and with rapidly increasing oxygen requirements in ER now on NRB.   Labs noted for Na 131, K 5.5, CO2 18, glucose 356, BUN 53, sCr 2.44 (baselien 1.99), BNP 295, Trop 0.33, WBC 14.4, Hgb 12.6,   Lactic acid 1.6, UA neg, EKG non-acute.  CXR noted for bilateral infiltrates.  Pending RVP and COVID-19 testing sent.  PCCM called for admit.   Past Medical History     CKD stage 3, DM, HLD, HTN, neuropathy, osteopenia, retinal detachment, TIA  Significant Hospital Events   3/30> admitted, intubated  Consults:    Procedures:  3/30 ETT >>   Significant Diagnostic Tests:  3/30 CXR >> Bilateral upper lobe airspace disease representing multifocal pneumonia versus pulmonary edema.   Micro Data:  3/30 BC x 2 >> 3/30 RVP >>neg 3/30 COVID-19 >> 3/30 Trach asp >>  3/30 urine strep >> 3/30 urine legionella >>  Antimicrobials:  3/30 azithro >> 3/30 ceftriaxone >>  Interim history/subjective:   Required intubation for increasing FiO2 needs  Objective   Blood pressure (!) 118/58, pulse 76, temperature (!) 103.2 F (39.6 C), temperature source Oral, resp. rate (!) 30, height 5\' 3"  (1.6 m), weight 78 kg, SpO2 96 %.    Vent Mode:  PRVC FiO2 (%):  [50 %-100 %] 60 % Set Rate:  [26 bmp] 26 bmp Vt Set:  [310 mL] 310 mL PEEP:  [8 cmH20-10 cmH20] 8 cmH20 Plateau Pressure:  [15 cmH20-17 cmH20] 15 cmH20   Intake/Output Summary (Last 24 hours) at 09/10/2018 1018 Last data filed at 09/10/2018 4888 Gross per 24 hour  Intake 561.37 ml  Output 1140 ml  Net -578.63 ml   Filed Weights   09/09/18 1004  Weight: 78 kg    Examination: General: Female sedated on full mechanical ventilatory support HEENT: No JVD lymphadenopathy is appreciated Neuro: Currently sedated CV: Sounds are distant PULM: Rhonchi bilaterally BV:QXIH, non-tender, bsx4 active  Extremities: warm/dry, 1+ edema  Skin: no rashes or lesions   Resolved Hospital Problem list     Assessment & Plan:   Acute Respiratory Failure with Hypoxia -rapidly increasing oxygen requirement -bilateral infiltrates on CXR (multifocal PNA vs pulm edema) -Concern for COVID, via community spread. No significant hx of travel, sick contacts -BNP slightly elevated to 295 P Intubated 09/09/2018 Adjust ventilator per ABG Chest x-ray serial Follow-up on viral panel including COVID-19 Diuresis per minute Continue Zithromax and ceftriaxone Monitor culture data  CKD stage III Lab Results  Component Value Date   CREATININE 1.95 (H) 09/10/2018   CREATININE 2.44 (H) 09/09/2018   CREATININE 1.99 (H) 07/07/2016    P Monitor creatinine Intermittent diuresis as needed  Elevated troponin Trop:  0.33 BNP 295 demand related in setting of hypoxia vs ACS P Troponins are gone from 0.33 to .06 Continue cardiac monitoring  HTN Home norvasc P Hold antihypertensives   DM2   P SSI  CBG (last 3)  Recent Labs    09/10/18 0404 09/10/18 0428 09/10/18 0756  GLUCAP 56* 98 97    Sliding scale insulin protocol  Hx Depression Hold Cymbalta  Hx HLD Hold statins  Disposition: 09/09/2018 transfer to intensive care unit intubated  Best practice:  Diet: NPO   Pain/Anxiety/Delirium protocol (if indicated): As needed VAP protocol (if indicated): Yes DVT prophylaxis: SQ Heparin  GI prophylaxis: protonix  Glucose control: SSI  Mobility: bedrest Code Status: Full  Family Communication: 09/10/2018 no family at bedside Disposition:ICU   Labs   CBC: Recent Labs  Lab 09/09/18 1002 09/09/18 1315 09/09/18 1646 09/10/18 0437  WBC 14.4*  --   --  16.0*  NEUTROABS 12.7*  --   --   --   HGB 12.6 11.6* 11.9* 12.6  HCT 39.4 34.0* 35.0* 40.4  MCV 96.3  --   --  94.8  PLT 292  --   --  638    Basic Metabolic Panel: Recent Labs  Lab 09/09/18 1002 09/09/18 1315 09/09/18 1646 09/10/18 0437  NA 131* 132* 135 139  K 5.5* 4.6 4.2 5.0  CL 97*  --   --  103  CO2 18*  --   --  25  GLUCOSE 356*  --   --  188*  BUN 53*  --   --  51*  CREATININE 2.44*  --   --  1.95*  CALCIUM 8.4*  --   --  8.5*  PHOS  --   --   --  2.8   GFR: Estimated Creatinine Clearance: 23.9 mL/min (A) (by C-G formula based on SCr of 1.95 mg/dL (H)). Recent Labs  Lab 09/09/18 1002 09/09/18 1005 09/10/18 0437  WBC 14.4*  --  16.0*  LATICACIDVEN  --  1.6  --     Liver Function Tests: Recent Labs  Lab 09/09/18 1002 09/10/18 0437  AST 97*  --   ALT 29  --   ALKPHOS 35*  --   BILITOT 0.8  --   PROT 7.0  --   ALBUMIN 2.7* 2.4*   No results for input(s): LIPASE, AMYLASE in the last 168 hours. No results for input(s): AMMONIA in the last 168 hours.  ABG    Component Value Date/Time   PHART 7.333 (L) 09/09/2018 1646   PCO2ART 41.8 09/09/2018 1646   PO2ART 219.0 (H) 09/09/2018 1646   HCO3 22.2 09/09/2018 1646   TCO2 23 09/09/2018 1646   ACIDBASEDEF 4.0 (H) 09/09/2018 1646   O2SAT 100.0 09/09/2018 1646     Coagulation Profile: No results for input(s): INR, PROTIME in the last 168 hours.  Cardiac Enzymes: Recent Labs  Lab 09/09/18 1002 09/09/18 1608 09/09/18 2155  TROPONINI 0.33* 0.10* 0.06*    HbA1C: Hgb A1c MFr Bld  Date/Time Value Ref Range  Status  04/19/2012 03:43 PM 7.3 (H) <5.7 % Final    Comment:    (NOTE)  According to the ADA Clinical Practice Recommendations for 2011, when HbA1c is used as a screening test:  >=6.5%   Diagnostic of Diabetes Mellitus           (if abnormal result is confirmed) 5.7-6.4%   Increased risk of developing Diabetes Mellitus References:Diagnosis and Classification of Diabetes Mellitus,Diabetes Care,2011,34(Suppl 1):S62-S69 and Standards of Medical Care in         Diabetes - 2011,Diabetes SKAJ,6811,57 (Suppl 1):S11-S61.  04/18/2012 09:20 AM 7.3 (H) <5.7 % Final    Comment:    (NOTE)                                                                       According to the ADA Clinical Practice Recommendations for 2011, when HbA1c is used as a screening test:  >=6.5%   Diagnostic of Diabetes Mellitus           (if abnormal result is confirmed) 5.7-6.4%   Increased risk of developing Diabetes Mellitus References:Diagnosis and Classification of Diabetes Mellitus,Diabetes WIOM,3559,74(BULAG 1):S62-S69 and Standards of Medical Care in         Diabetes - 2011,Diabetes Care,2011,34 (Suppl 1):S11-S61.    CBG: Recent Labs  Lab 09/09/18 2007 09/09/18 2344 09/10/18 0404 09/10/18 0428 09/10/18 0756  GLUCAP 173* 129* 56* 98 97      Critical care time: 45 minutes     Richardson Landry Minor ACNP Maryanna Shape PCCM Pager [phone] till 1 pm If no answer page [phone] 09/10/2018, 10:18 AM  Attending Note:  78 year old female with PMH of HTN and CKD who presents to PCCM with respiratory failure with COVID test pending.  On exam, coarse BS diffusely.  I reviewed CXR myself, ETT is in a good position.  Discussed with PCCM-NP.  FiO2 down to 60% and PEEP at 8.  Will increase PEEP to 10.  Will give 2 doses of lasix.  Check BMET in AM.  Patient is not ready for weaning at this time.  Abx as ordered.  COVID pending.  PCCM will continue to  manage.  The patient is critically ill with multiple organ systems failure and requires high complexity decision making for assessment and support, frequent evaluation and titration of therapies, application of advanced monitoring technologies and extensive interpretation of multiple databases.   Critical Care Time devoted to patient care services described in this note is  34  Minutes. This time reflects time of care of this signee Dr Jennet Maduro. This critical care time does not reflect procedure time, or teaching time or supervisory time of PA/NP/Med student/Med Resident etc but could involve care discussion time.  Rush Farmer, M.D. [facility] Pulmonary/Critical Care Medicine. Pager: [phone]. After hours pager: [phone].

## 2018-09-10 NOTE — Progress Notes (Signed)
120 mL of Versed wasted in black bin. Witnessed by April, RN.

## 2018-09-10 NOTE — Progress Notes (Signed)
Pharmacy is unable to confirm the medications the patient was taking at home. All options have been exhausted and a resolution to the situation is not expected.   Where possible, their outpatient pharmacy(s) have been contacted for the last time prescriptions were filled and that information has been added to each medication in an Order Note (highlighted yellow below the medication).  Please contact pharmacy if further assistance is needed.   Romeo Rabon, PharmD. Mobile: [phone]. 09/10/2018,4:47 PM.

## 2018-09-10 NOTE — Progress Notes (Signed)
Peripherally Inserted Central Catheter/Midline Placement  The IV Nurse has discussed with the patient and/or persons authorized to consent for the patient, the purpose of this procedure and the potential benefits and risks involved with this procedure.  The benefits include less needle sticks, lab draws from the catheter, and the patient may be discharged home with the catheter. Risks include, but not limited to, infection, bleeding, blood clot (thrombus formation), and puncture of an artery; nerve damage and irregular heartbeat and possibility to perform a PICC exchange if needed/ordered by physician.  Alternatives to this procedure were also discussed.  Bard Power PICC patient education guide, fact sheet on infection prevention and patient information card has been provided to patient /or left at bedside.    PICC/Midline Placement Documentation  PICC Triple Lumen 09/10/18 PICC Brachial 37 cm 0 cm (Active)  Indication for Insertion or Continuance of Line Limited venous access - need for IV therapy >5 days (PICC only) 09/10/2018 10:22 AM  Exposed Catheter (cm) 0 cm 09/10/2018 10:22 AM  Site Assessment Clean;Dry;Intact 09/10/2018 10:22 AM  Lumen #1 Status Flushed;Blood return noted 09/10/2018 10:22 AM  Lumen #2 Status Flushed;Blood return noted 09/10/2018 10:22 AM  Lumen #3 Status Flushed;Blood return noted 09/10/2018 10:22 AM  Dressing Type Transparent 09/10/2018 10:22 AM  Dressing Status Clean;Dry;Intact;Antimicrobial disc in place 09/10/2018 10:22 AM  Dressing Intervention New dressing 09/10/2018 10:22 AM  Dressing Change Due 09/17/18 09/10/2018 10:22 AM   Telephone consent signed by Daughter    Sabrina Baker 09/10/2018, 10:23 AM

## 2018-09-10 NOTE — Progress Notes (Signed)
Spoke with Presenter, broadcasting and made her aware that the patient would need a central line or PICC line for access, due to the medications ordered for the patient at this time.

## 2018-09-10 NOTE — Progress Notes (Signed)
Fi02 dropped per SpO2 of 100%.  Pt tp;eratomg well.

## 2018-09-10 NOTE — Progress Notes (Signed)
Initial Nutrition Assessment  DOCUMENTATION CODES:   Obesity unspecified  INTERVENTION:    Vital High Protein at 45 ml/h (1080 ml per day)  Pro-stat 30 ml once daily  Provides 1180 kcal, 110 gm protein, 903 ml free water daily  NUTRITION DIAGNOSIS:   Inadequate oral intake related to inability to eat as evidenced by NPO status.  GOAL:   Provide needs based on ASPEN/SCCM guidelines  MONITOR:   Vent status, TF tolerance, I & O's, Labs, Skin  REASON FOR ASSESSMENT:   Ventilator, Consult Enteral/tube feeding initiation and management  ASSESSMENT:   78 yo female with PMH of DM, HLD, HTN, CKD-3, neuromuscular disorder, B12 deficiency, osteopenia who was admitted with progressive SOB requiring intubation.  RVP was negative.   COVID-19 test results pending. Spoke with CCM NP, okay for RD to order TF. OGT in place.  Patient is currently intubated on ventilator support MV: 12 L/min Temp (24hrs), Avg:99.4 F (37.4 C), Min:97.4 F (36.3 C), Max:103.2 F (39.6 C)   Labs reviewed. BUN 51 (H), Creatinine 1.95 (H) Medications reviewed and include precedex, versed.   NUTRITION - FOCUSED PHYSICAL EXAM:  unable to complete at this time  Diet Order:   Diet Order            Diet NPO time specified  Diet effective now              EDUCATION NEEDS:   No education needs have been identified at this time  Skin:  Skin Assessment: (MASD buttocks & coccyx; blister to R foot)  Last BM:  none documented since admission  Height:   Ht Readings from Last 1 Encounters:  09/09/18 5\' 3"  (1.6 m)    Weight:   Wt Readings from Last 1 Encounters:  09/09/18 78 kg    Ideal Body Weight:  52.3 kg  BMI:  Body mass index is 30.47 kg/m.  Estimated Nutritional Needs:   Kcal:  [phone]  Protein:  105 gm  Fluid:  1.5 L    Molli Barrows, RD, LDN, CNSC Pager [phone] After Hours Pager [phone]

## 2018-09-10 NOTE — Progress Notes (Signed)
Pt spiked a temp of 103.2 orally. Pt blood glucose dropped to 56, no TFs running at this time. 41mL of D50 given per protocol. [place] called and notified. Will continue to monitor closely.  Clint Bolder, RN 09/10/18 04:30AM

## 2018-09-10 NOTE — Progress Notes (Signed)
Inpatient Diabetes Program Recommendations  AACE/ADA: New Consensus Statement on Inpatient Glycemic Control (2015)  Target Ranges:  Prepandial:   less than 140 mg/dL      Peak postprandial:   less than 180 mg/dL (1-2 hours)      Critically ill patients:  140 - 180 mg/dL   Lab Results  Component Value Date   GLUCAP 97 09/10/2018   HGBA1C 7.3 (H) 04/19/2012    Review of Glycemic Control Results for ALEATHA, TAITE (MRN [id number]) as of 09/10/2018 10:07  Ref. Range 09/09/2018 23:44 09/10/2018 04:04 09/10/2018 04:28 09/10/2018 07:56  Glucose-Capillary Latest Ref Range: 70 - 99 mg/dL 129 (H) 56 (L) 98 97   Diabetes history: T2DM Outpatient Diabetes medications: Toujeo 20 units q AM, Actos 15 mg daily, Trulicity 1.5 weekly Current orders for Inpatient glycemic control:  Novolog moderate q 4 hours  Inpatient Diabetes Program Recommendations:    Noted patient experienced hypoglycemia of 56 mg/dL this AM, consider decreasing correction to Novolog 0-9 units Q4H.   Thanks, Bronson Curb, MSN, RNC-OB Diabetes Coordinator [phone] (8a-5p)

## 2018-09-10 NOTE — Progress Notes (Signed)
Fio2 dropped to 50% and PEEP dropped to 8.  Pt tolerating well. Will continue to monitor.

## 2018-09-10 NOTE — Progress Notes (Signed)
CRITICAL VALUE ALERT  Critical Value:  Positive COVID  Date & Time Notied:  09/10/2018 @ 2004  Provider Notified: Dr Oletta Darter  Orders Received/Actions taken: MD aware; new orders

## 2018-09-10 NOTE — Progress Notes (Signed)
Spoke with patient's daughter-in-law, who got this information from patient's daughter, who informed this RN that the patient has four known associates that are COVID-19 positive. Two being a couple from her church, the husband DIED today. Another man from the church DIED from COVID-19, unknown date. The lady that take Sabrina Baker to a lot of her appointments is COVID-19 positive as well.

## 2018-09-10 NOTE — Progress Notes (Signed)
eLink Physician-Brief Progress Note Patient Name: Sabrina Baker DOB: 05-Sep-1940 MRN: [id number]   Date of Service  09/10/2018  HPI/Events of Note  Multiple issues: 1. Fever to 103.2 F. AST elevated, therefore, can't have Tylenol and 2. Blood glucose = 56.   eICU Interventions  Will order: 1. D10W IV infusion to run at 50 mL/hour. 2. Cooling blanket PRN.      Intervention Category Major Interventions: Infection - evaluation and management;Other:  Lysle Dingwall 09/10/2018, 5:51 AM

## 2018-09-10 NOTE — Progress Notes (Signed)
Sputum culture collected and sent to the lab. 

## 2018-09-11 ENCOUNTER — Inpatient Hospital Stay (HOSPITAL_COMMUNITY): Payer: Medicare Other

## 2018-09-11 DIAGNOSIS — N179 Acute kidney failure, unspecified: Secondary | ICD-10-CM

## 2018-09-11 DIAGNOSIS — J810 Acute pulmonary edema: Secondary | ICD-10-CM

## 2018-09-11 LAB — BASIC METABOLIC PANEL
Anion gap: 10 (ref 5–15)
BUN: 46 mg/dL — ABNORMAL HIGH (ref 8–23)
CHLORIDE: 106 mmol/L (ref 98–111)
CO2: 24 mmol/L (ref 22–32)
CREATININE: 1.59 mg/dL — AB (ref 0.44–1.00)
Calcium: 8.3 mg/dL — ABNORMAL LOW (ref 8.9–10.3)
GFR calc Af Amer: 36 mL/min — ABNORMAL LOW (ref >60)
GFR calc non Af Amer: 31 mL/min — ABNORMAL LOW (ref >60)
Glucose, Bld: 204 mg/dL — ABNORMAL HIGH (ref 70–99)
Potassium: 4.1 mmol/L (ref 3.5–5.1)
Sodium: 140 mmol/L (ref 135–145)

## 2018-09-11 LAB — CBC WITH DIFFERENTIAL/PLATELET
Abs Immature Granulocytes: 0.08 10*3/uL — ABNORMAL HIGH (ref 0.00–0.07)
Basophils Absolute: 0 10*3/uL (ref 0.0–0.1)
Basophils Relative: 0 %
EOS PCT: 1 %
Eosinophils Absolute: 0.1 10*3/uL (ref 0.0–0.5)
HCT: 38 % (ref 36.0–46.0)
Hemoglobin: 12.2 g/dL (ref 12.0–15.0)
Immature Granulocytes: 1 %
Lymphocytes Relative: 5 %
Lymphs Abs: 0.6 10*3/uL — ABNORMAL LOW (ref 0.7–4.0)
MCH: 30.7 pg (ref 26.0–34.0)
MCHC: 32.1 g/dL (ref 30.0–36.0)
MCV: 95.7 fL (ref 80.0–100.0)
Monocytes Absolute: 0.2 10*3/uL (ref 0.1–1.0)
Monocytes Relative: 2 %
Neutro Abs: 11.3 10*3/uL — ABNORMAL HIGH (ref 1.7–7.7)
Neutrophils Relative %: 91 %
Platelets: 248 10*3/uL (ref 150–400)
RBC: 3.97 MIL/uL (ref 3.87–5.11)
RDW: 14.3 % (ref 11.5–15.5)
WBC: 12.2 10*3/uL — ABNORMAL HIGH (ref 4.0–10.5)
nRBC: 0 % (ref 0.0–0.2)

## 2018-09-11 LAB — POCT I-STAT 7, (LYTES, BLD GAS, ICA,H+H)
Acid-base deficit: 2 mmol/L (ref 0.0–2.0)
Bicarbonate: 23.4 mmol/L (ref 20.0–28.0)
Calcium, Ion: 1.22 mmol/L (ref 1.15–1.40)
HCT: 35 % — ABNORMAL LOW (ref 36.0–46.0)
Hemoglobin: 11.9 g/dL — ABNORMAL LOW (ref 12.0–15.0)
O2 Saturation: 88 %
Potassium: 3.9 mmol/L (ref 3.5–5.1)
Sodium: 140 mmol/L (ref 135–145)
TCO2: 25 mmol/L (ref 22–32)
pCO2 arterial: 39.7 mmHg (ref 32.0–48.0)
pH, Arterial: 7.38 (ref 7.350–7.450)
pO2, Arterial: 55 mmHg — ABNORMAL LOW (ref 83.0–108.0)

## 2018-09-11 LAB — GLUCOSE, CAPILLARY
Glucose-Capillary: 132 mg/dL — ABNORMAL HIGH (ref 70–99)
Glucose-Capillary: 183 mg/dL — ABNORMAL HIGH (ref 70–99)
Glucose-Capillary: 210 mg/dL — ABNORMAL HIGH (ref 70–99)
Glucose-Capillary: 237 mg/dL — ABNORMAL HIGH (ref 70–99)
Glucose-Capillary: 243 mg/dL — ABNORMAL HIGH (ref 70–99)
Glucose-Capillary: 263 mg/dL — ABNORMAL HIGH (ref 70–99)

## 2018-09-11 LAB — MAGNESIUM: Magnesium: 1.8 mg/dL (ref 1.7–2.4)

## 2018-09-11 LAB — PHOSPHORUS: Phosphorus: 3.5 mg/dL (ref 2.5–4.6)

## 2018-09-11 MED ORDER — INSULIN GLARGINE 100 UNIT/ML ~~LOC~~ SOLN
5.0000 [IU] | Freq: Every day | SUBCUTANEOUS | Status: DC
  Administered 2018-09-11 – 2018-09-12 (×2): 5 [IU] via SUBCUTANEOUS
  Filled 2018-09-11 (×3): qty 0.05

## 2018-09-11 MED ORDER — POTASSIUM CHLORIDE 20 MEQ/15ML (10%) PO SOLN
40.0000 meq | Freq: Three times a day (TID) | ORAL | Status: AC
  Administered 2018-09-11 (×2): 40 meq
  Filled 2018-09-11 (×2): qty 30

## 2018-09-11 MED ORDER — METOLAZONE 5 MG PO TABS
5.0000 mg | ORAL_TABLET | Freq: Every day | ORAL | Status: AC
  Administered 2018-09-11: 5 mg via ORAL
  Filled 2018-09-11: qty 1

## 2018-09-11 MED ORDER — APIXABAN 5 MG PO TABS
5.0000 mg | ORAL_TABLET | Freq: Two times a day (BID) | ORAL | Status: DC
  Administered 2018-09-11 – 2018-09-17 (×12): 5 mg via ORAL
  Filled 2018-09-11 (×14): qty 1

## 2018-09-11 MED ORDER — MAGNESIUM SULFATE 2 GM/50ML IV SOLN
2.0000 g | Freq: Once | INTRAVENOUS | Status: AC
  Administered 2018-09-11: 2 g via INTRAVENOUS
  Filled 2018-09-11: qty 50

## 2018-09-11 MED ORDER — FUROSEMIDE 10 MG/ML IJ SOLN
40.0000 mg | Freq: Four times a day (QID) | INTRAMUSCULAR | Status: AC
  Administered 2018-09-11 – 2018-09-12 (×3): 40 mg via INTRAVENOUS
  Filled 2018-09-11 (×3): qty 4

## 2018-09-11 NOTE — Progress Notes (Signed)
Atrial fibrillation with controlled rate  Start on coumadin

## 2018-09-11 NOTE — Progress Notes (Signed)
ANTICOAGULATION CONSULT NOTE - Initial Consult  Pharmacy Consult for apixaban Indication: atrial fibrillation  Allergies  Allergen Reactions  . Altace [Ramipril] Anaphylaxis  . Diovan [Valsartan] Other (See Comments)    Hyperkalemia   . Niacin And Related Diarrhea  . Niacin Diarrhea    And nausea    Patient Measurements: Height: '5\' 3"'  (160 cm) Weight: 153 lb (69.4 kg) IBW/kg (Calculated) : 52.4 Heparin Dosing Weight:   Vital Signs: Temp: 96.1 F (35.6 C) (04/01 1701) BP: 150/69 (04/01 1800) Pulse Rate: 64 (04/01 1800)  Labs: Recent Labs    09/09/18 1002  09/09/18 1608  09/09/18 2155 09/10/18 0437 09/11/18 0229 09/11/18 0401  HGB 12.6   < >  --    < >  --  12.6 12.2 11.9*  HCT 39.4   < >  --    < >  --  40.4 38.0 35.0*  PLT 292  --   --   --   --  261 248  --   CREATININE 2.44*  --   --   --   --  1.95* 1.59*  --   TROPONINI 0.33*  --  0.10*  --  0.06*  --   --   --    < > = values in this interval not displayed.    Estimated Creatinine Clearance: 27.7 mL/min (A) (by C-G formula based on SCr of 1.59 mg/dL (H)).   Medical History: Past Medical History:  Diagnosis Date  . Chronic kidney disease    stage 3  . Diabetes mellitus without complication ([place])   . Hypercholesteremia   . Hypertension   . Neuromuscular disorder (HCC)    neuropathy  . Osteopenia   . Retinal detachment   . TIA (transient ischemic attack)   . Vitamin B12 deficiency     Medications:  Medications Prior to Admission  Medication Sig Dispense Refill Last Dose  . amLODipine (NORVASC) 5 MG tablet Take 5 mg by mouth daily.     Marland Kitchen atorvastatin (LIPITOR) 10 MG tablet Take 10 mg by mouth daily.   01/23/2016 at Unknown time  . calcium-vitamin D (OSCAL WITH D) 500-200 MG-UNIT per tablet Take 1 tablet by mouth 2 (two) times daily.    01/23/2016 at Unknown time  . carvedilol (COREG) 25 MG tablet Take 25 mg by mouth 2 (two) times daily with a meal.   1 01/24/2016 at 0900  . cholecalciferol (VITAMIN  D) 1000 UNITS tablet Take 1,000 Units by mouth daily.   01/23/2016 at Unknown time  . colchicine 0.6 MG tablet Take 0.6 mg by mouth daily as needed (gout). Started 04/07/13   01/23/2016 at Unknown time  . DULoxetine (CYMBALTA) 60 MG capsule Take by mouth daily.     . Ferrous Sulfate (IRON) 325 (65 FE) MG TABS Take 1 tablet by mouth 3 (three) times daily.   01/23/2016 at Unknown time  . gabapentin (NEURONTIN) 100 MG capsule Take 100 mg by mouth at bedtime.   3   . Insulin Glargine (TOUJEO SOLOSTAR [place]) Inject 20 Units into the skin every morning.   01/23/2016 at Unknown time  . Insulin Pen Needle (FIFTY50 PEN NEEDLES) 32G X 4 MM MISC USE TO INJECT INSULIN ONCE A DAY     . Lancets 30G MISC Use to test blood sugars 3 times a day (Dx: E11.65)     . latanoprost (XALATAN) 0.005 % ophthalmic solution Place 1 drop into the left eye at bedtime.   4   .  mupirocin ointment (BACTROBAN) 2 % Place 1 application into the nose 2 (two) times daily.   03/15/2015 at Unknown time  . pantoprazole (PROTONIX) 40 MG tablet Take 40 mg by mouth 2 (two) times daily.   01/24/2016 at 0900  . pioglitazone (ACTOS) 30 MG tablet Take 15 mg by mouth every evening.    01/23/2016 at Unknown time  . triamcinolone cream (KENALOG) 0.1 % Apply 1 application topically 2 (two) times daily.      . TRULICITY 1.5 BD/5.3GD SOPN Inject 1.5 mg into the skin every Monday.   4 Past Week at Unknown time  . vitamin B-12 (CYANOCOBALAMIN) 1000 MCG tablet Take 1 tablet once a day      Scheduled:  . chlorhexidine gluconate (MEDLINE KIT)  15 mL Mouth Rinse BID  . Chlorhexidine Gluconate Cloth  6 each Topical Daily  . feeding supplement (PRO-STAT SUGAR FREE 64)  30 mL Per Tube Daily  . feeding supplement (VITAL HIGH PROTEIN)  1,000 mL Per Tube Q24H  . fentaNYL (SUBLIMAZE) injection  50 mcg Intravenous Once  . furosemide  40 mg Intravenous Q6H  . heparin  5,000 Units Subcutaneous Q8H  . hydroxychloroquine  200 mg Per Tube BID  . insulin aspart  0-15 Units  Subcutaneous Q4H  . insulin glargine  5 Units Subcutaneous Daily  . mouth rinse  15 mL Mouth Rinse 10 times per day  . pantoprazole sodium  40 mg Per Tube Daily  . potassium chloride  40 mEq Per Tube TID  . sodium chloride flush  10-40 mL Intracatheter Q12H  . zinc sulfate  220 mg Per Tube Daily   Infusions:  . azithromycin 500 mg (09/11/18 1305)  . cefTRIAXone (ROCEPHIN)  IV 1 g (09/11/18 1305)  . dexmedetomidine (PRECEDEX) IV infusion 0.4 mcg/kg/hr (09/11/18 1828)  . dextrose Stopped (09/10/18 1203)  . fentaNYL infusion INTRAVENOUS 30 mcg/hr (09/11/18 1828)  . magnesium sulfate 1 - 4 g bolus IVPB 2 g (09/11/18 1806)    Assessment:  Sabrina Baker is here in the ICU for SOB and being r/o COVID-19. He has a hx TIA and was found to be in afib tonight. Coumadin was ordered for anticoag before a d/w Dr. Ander Slade to change to apixaban instead. She is <80yo, wt>60, scr>1.5. We will use standard dosing.   She is on plaquenil/azith for COVID-19.   Goal of Therapy:  Monitor platelets by anticoagulation protocol: Yes   Plan:   Apixaban 32m PO BID F/u with CBC for bleeding and bmet  MOnnie Boer PharmD, [place] AAHIVP, CPP Infectious Disease Pharmacist 09/11/2018 6:54 PM

## 2018-09-11 NOTE — Progress Notes (Signed)
Pts UOP has gradually decreased since 1900. Now only putting out 15-33mL/hour. RN notified Corliss Skains, RN. Will continue to monitor closely. Clint Bolder, RN 09/11/18 12:40 AM

## 2018-09-11 NOTE — Progress Notes (Addendum)
NAME:  Sabrina Baker, MRN:  [id number], DOB:  [birth year], LOS: 2 ADMISSION DATE:  09/09/2018, CONSULTATION DATE:  09/09/18 REFERRING MD:  Regenia Skeeter - EM, CHIEF COMPLAINT:  SOB, diarrhea    Brief History   89 yoF presenting from home with c/o of cough, SOB, and abdominal pain x 2 weeks. Progressive hypoxia in ER now on NRB and CXR with bilateral infiltrates    History of present illness   78 year old female with history of HTN, HLD, DM, CKD stage 3, TIA, and neuropathy presenting with two history of generalized weakness, cough, shortness of breath (progressive over 1 week) and diarrhea.   She from home, lives alone.  Denies exposure to sick contact including COVID-19 or travel history.   She presented by EMS.  In ER, vitals noted for temp 99, normotensive and with rapidly increasing oxygen requirements in ER now on NRB.   Labs noted for Na 131, K 5.5, CO2 18, glucose 356, BUN 53, sCr 2.44 (baselien 1.99), BNP 295, Trop 0.33, WBC 14.4, Hgb 12.6,   Lactic acid 1.6, UA neg, EKG non-acute.  CXR noted for bilateral infiltrates.  Pending RVP and COVID-19 testing sent.  PCCM called for admit.   Past Medical History     CKD stage 3, DM, HLD, HTN, neuropathy, osteopenia, retinal detachment, TIA  Significant Hospital Events   3/30> admitted, intubated  Consults:    Procedures:  3/30 ETT >>  09/10/2018 right PIC>>  Significant Diagnostic Tests:  3/30 CXR >> Bilateral upper lobe airspace disease representing multifocal pneumonia versus pulmonary edema.   Micro Data:  3/30 BC x 2 >> 3/30 RVP >>neg 3/30 COVID-19 >> positive 3/30 Trach asp >>  3/30 urine strep >> 3/30 urine legionella >>  Antimicrobials:  3/30 azithro >> 3/30 ceftriaxone >> 09/10/2018>>  Interim history/subjective:   FiO2 increased to 60%  Objective   Blood pressure (!) 158/79, pulse 71, temperature 99.5 F (37.5 C), resp. rate (!) 22, height 5\' 3"  (1.6 m), weight 69.4 kg, SpO2 93 %. CVP:  [7 mmHg-10 mmHg] 8  mmHg  Vent Mode: PRVC FiO2 (%):  [40 %-60 %] 60 % Set Rate:  [26 bmp] 26 bmp Vt Set:  [310 mL] 310 mL PEEP:  [8 cmH20] 8 cmH20 Plateau Pressure:  [11 cmH20-16 cmH20] 11 cmH20   Intake/Output Summary (Last 24 hours) at 09/11/2018 5329 Last data filed at 09/11/2018 0600 Gross per 24 hour  Intake 1625.58 ml  Output 800 ml  Net 825.58 ml   Filed Weights   09/09/18 1004 09/11/18 0400  Weight: 78 kg 69.4 kg    Examination: General: Frail elderly female HEENT: No lymphadenopathy is appreciated, endotracheal tube, gastric tube in place Neuro: Arouses to voice does not follow commands, moves her right hand very intermittently CV: s1s2 rrr, no m/r/g PULM: even/non-labored, lungs bilaterally decreased throughout mild rhonchi JM:EQAS, non-tender, bsx4 active  Extremities: warm/dry, 1+ edema  Bilateral heel guards in place     Resolved Hospital Problem list     Assessment & Plan:   Acute Respiratory Failure with Hypoxia -rapidly increasing oxygen requirement -bilateral infiltrates on CXR (multifocal PNA vs pulm edema) -Positive COVID-19 -BNP slightly elevated to 295 P Required intubation on 09/09/2018 09/10/2018 FiO2 decreased to 60% Check electrolytes  COVID-19 came back positive Diuresis as tolerated Continue Zithromax, ceftriaxone, Plaquenil, Zithromax Continue to monitor culture data  CKD stage III Lab Results  Component Value Date   CREATININE 1.59 (H) 09/11/2018   CREATININE 1.95 (H) 09/10/2018  CREATININE 2.44 (H) 09/09/2018    Intake/Output Summary (Last 24 hours) at 09/11/2018 0910 Last data filed at 09/11/2018 0600 Gross per 24 hour  Intake 1625.58 ml  Output 800 ml  Net 825.58 ml   P Continue to monitor creatinine Diuresed 09/10/2018 remains +825 cc  Elevated troponin Trop: 0.33 BNP 295 demand related in setting of hypoxia vs ACS P Troponins as noted Continue cardiac monitor  HTN Home norvasc P Continue to hold antihypertensives   DM2   P  SSI  CBG (last 3)  Recent Labs    09/11/18 0009 09/11/18 0411 09/11/18 0828  GLUCAP 132* 210* 183*    Continue sliding insulin protocol 09/11/2018 add low-dose lantus  Hx Depression Continue to hold Cymbalta while on IV sedation  Hx HLD Holding statins  Disposition: 09/11/2018 remains in intensive care unit COVID-19 was positive  Best practice:  Diet: NPO, tube feeding tolerated Pain/Anxiety/Delirium protocol (if indicated): As needed VAP protocol (if indicated): Yes DVT prophylaxis: SQ Heparin  GI prophylaxis: protonix  Glucose control: SSI  Mobility: bedrest Code Status: Full  Family Communication: 09/11/2018 no family at bedside Disposition:ICU   Labs   CBC: Recent Labs  Lab 09/09/18 1002 09/09/18 1315 09/09/18 1646 09/10/18 0437 09/11/18 0229 09/11/18 0401  WBC 14.4*  --   --  16.0* 12.2*  --   NEUTROABS 12.7*  --   --   --  11.3*  --   HGB 12.6 11.6* 11.9* 12.6 12.2 11.9*  HCT 39.4 34.0* 35.0* 40.4 38.0 35.0*  MCV 96.3  --   --  94.8 95.7  --   PLT 292  --   --  261 248  --     Basic Metabolic Panel: Recent Labs  Lab 09/09/18 1002 09/09/18 1315 09/09/18 1646 09/10/18 0437 09/11/18 0229 09/11/18 0401  NA 131* 132* 135 139 140 140  K 5.5* 4.6 4.2 5.0 4.1 3.9  CL 97*  --   --  103 106  --   CO2 18*  --   --  25 24  --   GLUCOSE 356*  --   --  188* 204*  --   BUN 53*  --   --  51* 46*  --   CREATININE 2.44*  --   --  1.95* 1.59*  --   CALCIUM 8.4*  --   --  8.5* 8.3*  --   MG  --   --   --   --  1.8  --   PHOS  --   --   --  2.8 3.5  --    GFR: Estimated Creatinine Clearance: 27.7 mL/min (A) (by C-G formula based on SCr of 1.59 mg/dL (H)). Recent Labs  Lab 09/09/18 1002 09/09/18 1005 09/10/18 0437 09/11/18 0229  WBC 14.4*  --  16.0* 12.2*  LATICACIDVEN  --  1.6  --   --     Liver Function Tests: Recent Labs  Lab 09/09/18 1002 09/10/18 0437  AST 97*  --   ALT 29  --   ALKPHOS 35*  --   BILITOT 0.8  --   PROT 7.0  --   ALBUMIN  2.7* 2.4*   No results for input(s): LIPASE, AMYLASE in the last 168 hours. No results for input(s): AMMONIA in the last 168 hours.  ABG    Component Value Date/Time   PHART 7.380 09/11/2018 0401   PCO2ART 39.7 09/11/2018 0401   PO2ART 55.0 (L) 09/11/2018 0401   HCO3 23.4  09/11/2018 0401   TCO2 25 09/11/2018 0401   ACIDBASEDEF 2.0 09/11/2018 0401   O2SAT 88.0 09/11/2018 0401     Coagulation Profile: No results for input(s): INR, PROTIME in the last 168 hours.  Cardiac Enzymes: Recent Labs  Lab 09/09/18 1002 09/09/18 1608 09/09/18 2155  TROPONINI 0.33* 0.10* 0.06*    HbA1C: Hgb A1c MFr Bld  Date/Time Value Ref Range Status  04/19/2012 03:43 PM 7.3 (H) <5.7 % Final    Comment:    (NOTE)                                                                       According to the ADA Clinical Practice Recommendations for 2011, when HbA1c is used as a screening test:  >=6.5%   Diagnostic of Diabetes Mellitus           (if abnormal result is confirmed) 5.7-6.4%   Increased risk of developing Diabetes Mellitus References:Diagnosis and Classification of Diabetes Mellitus,Diabetes YQMV,7846,96(EXBMW 1):S62-S69 and Standards of Medical Care in         Diabetes - 2011,Diabetes UXLK,4401,02 (Suppl 1):S11-S61.  04/18/2012 09:20 AM 7.3 (H) <5.7 % Final    Comment:    (NOTE)                                                                       According to the ADA Clinical Practice Recommendations for 2011, when HbA1c is used as a screening test:  >=6.5%   Diagnostic of Diabetes Mellitus           (if abnormal result is confirmed) 5.7-6.4%   Increased risk of developing Diabetes Mellitus References:Diagnosis and Classification of Diabetes Mellitus,Diabetes VOZD,6644,03(KVQQV 1):S62-S69 and Standards of Medical Care in         Diabetes - 2011,Diabetes Care,2011,34 (Suppl 1):S11-S61.    CBG: Recent Labs  Lab 09/10/18 1527 09/10/18 2003 09/11/18 0009 09/11/18 0411 09/11/18  0828  GLUCAP 255* 125* 132* 210* 183*      Critical care time: 45 minutes     Richardson Landry Minor ACNP [facility] PCCM Pager [phone] till 1 pm If no answer page [phone]- [phone] 09/11/2018, 9:05 AM  Attending Note:  78 year old female with extensive PMH who presents to PCCM with respiratory failure from COVID+ pneumonia.  Patient has done well overnight and and down to 5% and 8.  On exam, diffuse crackles noted.  Discussed with PCCM-NP.  Will begin active diureses today.  Will replace K.  AM labs ordered.  Keep patient dry to even after today's diureses.  Continue rocephin, zithromax and plaquinel.  F/U on cultures.  PCCM will continue to manage.  The patient is critically ill with multiple organ systems failure and requires high complexity decision making for assessment and support, frequent evaluation and titration of therapies, application of advanced monitoring technologies and extensive interpretation of multiple databases.   Critical Care Time devoted to patient care services described in this note is  32  Minutes. This time reflects  time of care of this signee Dr Jennet Maduro. This critical care time does not reflect procedure time, or teaching time or supervisory time of PA/NP/Med student/Med Resident etc but could involve care discussion time.  Rush Farmer, M.D. [facility] Pulmonary/Critical Care Medicine. Pager: [phone]. After hours pager: [phone].

## 2018-09-11 NOTE — Progress Notes (Signed)
eLink Physician-Brief Progress Note Patient Name: Sabrina Baker DOB: 1940/07/11 MRN: [id number]   Date of Service  09/11/2018  HPI/Events of Note  Oliguria   eICU Interventions  Will order: 1. Monitor CVP now and Q 4 hours.      Intervention Category Intermediate Interventions: Oliguria - evaluation and management  Sommer,Steven Eugene 09/11/2018, 12:52 AM

## 2018-09-11 NOTE — Progress Notes (Signed)
Patient with rectal temp of 96.1. Four blankets from the blanket warmer applied. No change in temp after 30 minutes. Bear hugger blanket placed set to 38 C. Patient also noted to have intermittent episodes of rate controlled atrial fibrillation. Dr. Ander Slade notified.  Will continue to monitor. Roselyn Reef Jc Veron,RN

## 2018-09-11 NOTE — Progress Notes (Signed)
FIO2 increased to 60% Per ABG results.

## 2018-09-12 ENCOUNTER — Inpatient Hospital Stay (HOSPITAL_COMMUNITY): Payer: Medicare Other

## 2018-09-12 DIAGNOSIS — I959 Hypotension, unspecified: Secondary | ICD-10-CM

## 2018-09-12 LAB — POCT I-STAT 7, (LYTES, BLD GAS, ICA,H+H)
Bicarbonate: 25.6 mmol/L (ref 20.0–28.0)
Calcium, Ion: 1.27 mmol/L (ref 1.15–1.40)
HCT: 32 % — ABNORMAL LOW (ref 36.0–46.0)
Hemoglobin: 10.9 g/dL — ABNORMAL LOW (ref 12.0–15.0)
O2 Saturation: 88 %
Patient temperature: 98.6
Potassium: 5.2 mmol/L — ABNORMAL HIGH (ref 3.5–5.1)
Sodium: 143 mmol/L (ref 135–145)
TCO2: 27 mmol/L (ref 22–32)
pCO2 arterial: 42.8 mmHg (ref 32.0–48.0)
pH, Arterial: 7.384 (ref 7.350–7.450)
pO2, Arterial: 56 mmHg — ABNORMAL LOW (ref 83.0–108.0)

## 2018-09-12 LAB — CULTURE, RESPIRATORY W GRAM STAIN: Culture: NORMAL

## 2018-09-12 LAB — MAGNESIUM: Magnesium: 2.3 mg/dL (ref 1.7–2.4)

## 2018-09-12 LAB — CBC
HCT: 34 % — ABNORMAL LOW (ref 36.0–46.0)
Hemoglobin: 10.6 g/dL — ABNORMAL LOW (ref 12.0–15.0)
MCH: 30.2 pg (ref 26.0–34.0)
MCHC: 31.2 g/dL (ref 30.0–36.0)
MCV: 96.9 fL (ref 80.0–100.0)
Platelets: 261 10*3/uL (ref 150–400)
RBC: 3.51 MIL/uL — ABNORMAL LOW (ref 3.87–5.11)
RDW: 14.6 % (ref 11.5–15.5)
WBC: 8.9 10*3/uL (ref 4.0–10.5)
nRBC: 0 % (ref 0.0–0.2)

## 2018-09-12 LAB — BASIC METABOLIC PANEL
Anion gap: 8 (ref 5–15)
BUN: 52 mg/dL — ABNORMAL HIGH (ref 8–23)
CO2: 24 mmol/L (ref 22–32)
Calcium: 8.7 mg/dL — ABNORMAL LOW (ref 8.9–10.3)
Chloride: 109 mmol/L (ref 98–111)
Creatinine, Ser: 1.41 mg/dL — ABNORMAL HIGH (ref 0.44–1.00)
GFR calc Af Amer: 42 mL/min — ABNORMAL LOW (ref >60)
GFR calc non Af Amer: 36 mL/min — ABNORMAL LOW (ref >60)
Glucose, Bld: 262 mg/dL — ABNORMAL HIGH (ref 70–99)
Potassium: 5.1 mmol/L (ref 3.5–5.1)
Sodium: 141 mmol/L (ref 135–145)

## 2018-09-12 LAB — PHOSPHORUS: Phosphorus: 2 mg/dL — ABNORMAL LOW (ref 2.5–4.6)

## 2018-09-12 LAB — GLUCOSE, CAPILLARY
Glucose-Capillary: 227 mg/dL — ABNORMAL HIGH (ref 70–99)
Glucose-Capillary: 249 mg/dL — ABNORMAL HIGH (ref 70–99)
Glucose-Capillary: 224 mg/dL — ABNORMAL HIGH (ref 70–99)
Glucose-Capillary: 229 mg/dL — ABNORMAL HIGH (ref 70–99)
Glucose-Capillary: 150 mg/dL — ABNORMAL HIGH (ref 70–99)
Glucose-Capillary: 277 mg/dL — ABNORMAL HIGH (ref 70–99)

## 2018-09-12 MED ORDER — FUROSEMIDE 10 MG/ML IJ SOLN
40.0000 mg | Freq: Four times a day (QID) | INTRAMUSCULAR | Status: AC
  Administered 2018-09-12 (×2): 40 mg via INTRAVENOUS
  Filled 2018-09-12 (×2): qty 4

## 2018-09-12 MED ORDER — INSULIN GLARGINE 100 UNIT/ML ~~LOC~~ SOLN
10.0000 [IU] | Freq: Every day | SUBCUTANEOUS | Status: DC
  Administered 2018-09-13: 10 [IU] via SUBCUTANEOUS
  Filled 2018-09-12 (×2): qty 0.1

## 2018-09-12 MED ORDER — DOCUSATE SODIUM 50 MG/5ML PO LIQD
100.0000 mg | Freq: Two times a day (BID) | ORAL | Status: DC
  Administered 2018-09-12 – 2018-09-16 (×7): 100 mg
  Filled 2018-09-12 (×7): qty 10

## 2018-09-12 MED ORDER — INSULIN GLARGINE 100 UNIT/ML ~~LOC~~ SOLN
5.0000 [IU] | Freq: Once | SUBCUTANEOUS | Status: AC
  Administered 2018-09-12: 12:00:00 5 [IU] via SUBCUTANEOUS
  Filled 2018-09-12: qty 0.05

## 2018-09-12 MED ORDER — VITAL AF 1.2 CAL PO LIQD
1000.0000 mL | ORAL | Status: DC
  Administered 2018-09-12 – 2018-09-23 (×12): 1000 mL

## 2018-09-12 NOTE — Plan of Care (Signed)
  Problem: Education: Goal: Knowledge of General Education information will improve Description Including pain rating scale, medication(s)/side effects and non-pharmacologic comfort measures Outcome: Progressing  Pts daughter called and was updated on patient status and POC for this shift. Ultimately no changes in patient condition. All questions/concerns were answered/addressed.

## 2018-09-12 NOTE — Progress Notes (Signed)
Inpatient Diabetes Program Recommendations  AACE/ADA: New Consensus Statement on Inpatient Glycemic Control (2015)  Target Ranges:  Prepandial:   less than 140 mg/dL      Peak postprandial:   less than 180 mg/dL (1-2 hours)      Critically ill patients:  140 - 180 mg/dL   Lab Results  Component Value Date   GLUCAP 224 (H) 09/12/2018   HGBA1C 7.3 (H) 04/19/2012    Review of Glycemic Control Results for Sabrina Baker, Sabrina Baker (MRN [id number]) as of 09/12/2018 10:57  Ref. Range 09/11/2018 17:09 09/11/2018 19:54 09/11/2018 23:58 09/12/2018 03:57 09/12/2018 08:52  Glucose-Capillary Latest Ref Range: 70 - 99 mg/dL 243 (H) 263 (H) 249 (H) 227 (H) 224 (H)   Inpatient Diabetes Program Recommendations:   Patient has received a total of Novolog 36 units correction over the past 24 hrs. Please consider: -Increase Levemir to 5 units bid -Add Novolog 3 units q 4 hrs tube feed coverage (hold if tube feeding held or stopped)  Thank you, Bethena Roys E. Earnie Bechard, RN, MSN, CDE  Diabetes Coordinator Inpatient Glycemic Control Team Team Pager [phone] (8am-5pm) 09/12/2018 10:59 AM

## 2018-09-12 NOTE — Progress Notes (Addendum)
NAME:  Sabrina Baker, MRN:  [id number], DOB:  1941/06/07, LOS: 3 ADMISSION DATE:  09/09/2018, CONSULTATION DATE:  09/09/18 REFERRING MD:  Regenia Skeeter - EM, CHIEF COMPLAINT:  SOB, diarrhea    Brief History   53 yoF presenting from home with c/o of cough, SOB, and abdominal pain x 2 weeks. Progressive hypoxia in ER now on NRB and CXR with bilateral infiltrates    History of present illness   78 year old female with history of HTN, HLD, DM, CKD stage 3, TIA, and neuropathy presenting with two history of generalized weakness, cough, shortness of breath (progressive over 1 week) and diarrhea.   She from home, lives alone.  Denies exposure to sick contact including COVID-19 or travel history.   She presented by EMS.  In ER, vitals noted for temp 99, normotensive and with rapidly increasing oxygen requirements in ER now on NRB.   Labs noted for Na 131, K 5.5, CO2 18, glucose 356, BUN 53, sCr 2.44 (baselien 1.99), BNP 295, Trop 0.33, WBC 14.4, Hgb 12.6,   Lactic acid 1.6, UA neg, EKG non-acute.  CXR noted for bilateral infiltrates.  Pending RVP and COVID-19 testing sent.  PCCM called for admit.   Past Medical History     CKD stage 3, DM, HLD, HTN, neuropathy, osteopenia, retinal detachment, TIA  Significant Hospital Events   3/30> admitted, intubated  Consults:    Procedures:  3/30 ETT >>  09/10/2018 right PIC>>  Significant Diagnostic Tests:  3/30 CXR >> Bilateral upper lobe airspace disease representing multifocal pneumonia versus pulmonary edema.   Micro Data:  3/30 BC x 2 >> 3/30 RVP >>neg 3/30 COVID-19 >> positive 3/30 Trach asp >> normal flora  3/30 urine strep >> negative 3/30 urine legionella >> negative  Antimicrobials:  3/30 azithro >> 3/30 ceftriaxone >> 09/10/2018 Plaquenil >>  Interim history/subjective:   FiO2 is now down to 50%.,  Continue diuresis noted to be -634 cc in 24 hours.  Continue to evaluate for possible extubation near future  Objective   Blood  pressure 131/65, pulse 80, temperature 97.8 F (36.6 C), temperature source Axillary, resp. rate (!) 26, height 5\' 3"  (1.6 m), weight 71.4 kg, SpO2 96 %. CVP:  [4 mmHg-7 mmHg] 6 mmHg  Vent Mode: PRVC FiO2 (%):  [50 %-60 %] 50 % Set Rate:  [26 bmp] 26 bmp Vt Set:  [310 mL] 310 mL PEEP:  [8 cmH20] 8 cmH20 Plateau Pressure:  [12 cmH20-15 cmH20] 15 cmH20   Intake/Output Summary (Last 24 hours) at 09/12/2018 1112 Last data filed at 09/12/2018 1000 Gross per 24 hour  Intake 1790.84 ml  Output 2425 ml  Net -634.16 ml   Filed Weights   09/09/18 1004 09/11/18 0400 09/12/18 0400  Weight: 78 kg 69.4 kg 71.4 kg    Examination: General: Frail elderly female who is currently on mechanical monitor respiratory HEENT: Tracheal tube and gastric tube in place. Neuro: Currently sedated CV: Sounds are distant PULM: Currently respiratory rate 26 with no overbreathing tube is currently 50%. DG:UYQI, non-tender, bsx4 active, tube feedings at goal Extremities: warm/dry, 1+ edema  Skin: no rashes or lesions bilateral heel protectors in place      Resolved Hospital Problem list     Assessment & Plan:   Acute Respiratory Failure with Hypoxia -rapidly increasing oxygen requirement -bilateral infiltrates on CXR (multifocal PNA vs pulm edema) -Positive COVID-19 -BNP slightly elevated to 295 P Intubated on 09/09/2018 09/12/2018 currently on percent FiO2 briefly requiring 60% Continue  diuresis on 09/12/1998 Continue treatment for COVID-19 viral Continue Zithromax ,Plaquenil day 3 /5,  ceftriaxone and currently day 4 on 09/12/2018 we will continue till day 7. Culture data   CKD stage III Lab Results  Component Value Date   CREATININE 1.41 (H) 09/12/2018   CREATININE 1.59 (H) 09/11/2018   CREATININE 1.95 (H) 09/10/2018    Intake/Output Summary (Last 24 hours) at 09/12/2018 1112 Last data filed at 09/12/2018 1000 Gross per 24 hour  Intake 1790.84 ml  Output 2425 ml  Net -634.16 ml   P Continue to  monitor creatinine Lasix 40 mg every 6x2 doses on 09/12/2018  Elevated troponin Trop: 0.33 BNP 295 demand related in setting of hypoxia vs ACS P Continue cardiac monitor  HTN Home norvasc P Continue to hold home antihypertensives   DM2   P SSI  CBG (last 3)  Recent Labs    09/11/18 2358 09/12/18 0357 09/12/18 0852  GLUCAP 249* 227* 224*    Continue sliding insulin protocol 09/12/2018 Lantus increased to 10 units daily no improvement in CBG  Hx Depression Continue to hold Cymbalta while on IV sedation  Hx HLD Continue to hold statins  Disposition: 09/12/2018 intensive care unit COVID-19 positive  Best practice:  Diet: NPO, tube feeding tolerated Pain/Anxiety/Delirium protocol (if indicated): As needed VAP protocol (if indicated): Yes DVT prophylaxis: SQ Heparin  GI prophylaxis: protonix  Glucose control: SSI  Mobility: bedrest Code Status: Full  Family Communication: 09/12/2018 no family bedside Disposition:ICU   Labs   CBC: Recent Labs  Lab 09/09/18 1002  09/10/18 0437 09/11/18 0229 09/11/18 0401 09/12/18 0340 09/12/18 0355  WBC 14.4*  --  16.0* 12.2*  --   --  8.9  NEUTROABS 12.7*  --   --  11.3*  --   --   --   HGB 12.6   < > 12.6 12.2 11.9* 10.9* 10.6*  HCT 39.4   < > 40.4 38.0 35.0* 32.0* 34.0*  MCV 96.3  --  94.8 95.7  --   --  96.9  PLT 292  --  261 248  --   --  261   < > = values in this interval not displayed.    Basic Metabolic Panel: Recent Labs  Lab 09/09/18 1002  09/10/18 0437 09/11/18 0229 09/11/18 0401 09/12/18 0340 09/12/18 0355  NA 131*   < > 139 140 140 143 141  K 5.5*   < > 5.0 4.1 3.9 5.2* 5.1  CL 97*  --  103 106  --   --  109  CO2 18*  --  25 24  --   --  24  GLUCOSE 356*  --  188* 204*  --   --  262*  BUN 53*  --  51* 46*  --   --  52*  CREATININE 2.44*  --  1.95* 1.59*  --   --  1.41*  CALCIUM 8.4*  --  8.5* 8.3*  --   --  8.7*  MG  --   --   --  1.8  --   --  2.3  PHOS  --   --  2.8 3.5  --   --  2.0*   < > =  values in this interval not displayed.   GFR: Estimated Creatinine Clearance: 31.6 mL/min (A) (by C-G formula based on SCr of 1.41 mg/dL (H)). Recent Labs  Lab 09/09/18 1002 09/09/18 1005 09/10/18 0437 09/11/18 0229 09/12/18 0355  WBC 14.4*  --  16.0* 12.2* 8.9  LATICACIDVEN  --  1.6  --   --   --     Liver Function Tests: Recent Labs  Lab 09/09/18 1002 09/10/18 0437  AST 97*  --   ALT 29  --   ALKPHOS 35*  --   BILITOT 0.8  --   PROT 7.0  --   ALBUMIN 2.7* 2.4*   No results for input(s): LIPASE, AMYLASE in the last 168 hours. No results for input(s): AMMONIA in the last 168 hours.  ABG    Component Value Date/Time   PHART 7.384 09/12/2018 0340   PCO2ART 42.8 09/12/2018 0340   PO2ART 56.0 (L) 09/12/2018 0340   HCO3 25.6 09/12/2018 0340   TCO2 27 09/12/2018 0340   ACIDBASEDEF 2.0 09/11/2018 0401   O2SAT 88.0 09/12/2018 0340     Coagulation Profile: No results for input(s): INR, PROTIME in the last 168 hours.  Cardiac Enzymes: Recent Labs  Lab 09/09/18 1002 09/09/18 1608 09/09/18 2155  TROPONINI 0.33* 0.10* 0.06*    HbA1C: Hgb A1c MFr Bld  Date/Time Value Ref Range Status  04/19/2012 03:43 PM 7.3 (H) <5.7 % Final    Comment:    (NOTE)                                                                       According to the ADA Clinical Practice Recommendations for 2011, when HbA1c is used as a screening test:  >=6.5%   Diagnostic of Diabetes Mellitus           (if abnormal result is confirmed) 5.7-6.4%   Increased risk of developing Diabetes Mellitus References:Diagnosis and Classification of Diabetes Mellitus,Diabetes KGUR,4270,62(BJSEG 1):S62-S69 and Standards of Medical Care in         Diabetes - 2011,Diabetes BTDV,7616,07 (Suppl 1):S11-S61.  04/18/2012 09:20 AM 7.3 (H) <5.7 % Final    Comment:    (NOTE)                                                                       According to the ADA Clinical Practice Recommendations for 2011,  when HbA1c is used as a screening test:  >=6.5%   Diagnostic of Diabetes Mellitus           (if abnormal result is confirmed) 5.7-6.4%   Increased risk of developing Diabetes Mellitus References:Diagnosis and Classification of Diabetes Mellitus,Diabetes PXTG,6269,48(NIOEV 1):S62-S69 and Standards of Medical Care in         Diabetes - 2011,Diabetes Care,2011,34 (Suppl 1):S11-S61.    CBG: Recent Labs  Lab 09/11/18 1709 09/11/18 1954 09/11/18 2358 09/12/18 0357 09/12/18 0852  GLUCAP 243* 263* 249* 227* 224*      Critical care time: 40 minutes     Richardson Landry Minor ACNP [facility] Shape PCCM Pager [phone] till 1 pm If no answer page [phone] 09/12/2018, 11:12 AM  Attending Note:  78 year old female with COVID induced respiratory failure.  No events overnight.  On exam, coarse BS diffusely.  I reviewed CXR myself, ETT is in a good position.  Discussed with PCCM-NP.  Will continue vent support.  Titrate PEEP and FiO2 for sats.  Active diureses today.  Continue rocephin, zithromax and plaquenil.  F/U on cultures.  Stop date for abx is in place.  Replace electrolytes as indicated.  BMET in AM and monitor for renal failure.  PCCM will continue to manage.  The patient is critically ill with multiple organ systems failure and requires high complexity decision making for assessment and support, frequent evaluation and titration of therapies, application of advanced monitoring technologies and extensive interpretation of multiple databases.   Critical Care Time devoted to patient care services described in this note is  33  Minutes. This time reflects time of care of this signee Dr Jennet Maduro. This critical care time does not reflect procedure time, or teaching time or supervisory time of PA/NP/Med student/Med Resident etc but could involve care discussion time.  Rush Farmer, M.D. [facility] Pulmonary/Critical Care Medicine. Pager: [phone]. After hours pager: [phone].

## 2018-09-12 NOTE — Progress Notes (Signed)
eLink Physician-Brief Progress Note Patient Name: Sabrina Baker DOB: 04/02/1941 MRN: [id number]   Date of Service  09/12/2018  HPI/Events of Note  Notified that the NGT seems malpositioned.   eICU Interventions  Tube feeds have been held.  Reinsert NGT and check CXR.        Elsie Lincoln 09/12/2018, 9:21 PM

## 2018-09-12 NOTE — Progress Notes (Signed)
Nutrition Follow-up RD working remotely.  DOCUMENTATION CODES:   Not applicable  INTERVENTION:   Change TF regimen to better meet re-estimated nutrition needs:  Vital AF 1.2 at 55 ml/h (1320 ml per day)  Provides 1584 kcal, 99 gm protein, 1071 ml free water daily  NUTRITION DIAGNOSIS:   Inadequate oral intake related to inability to eat as evidenced by NPO status.  Ongoing   GOAL:   Provide needs based on ASPEN/SCCM guidelines  Met with TF  MONITOR:   Vent status, TF tolerance, I & O's, Labs, Skin  ASSESSMENT:   78 yo female with PMH of DM, HLD, HTN, CKD-3, neuromuscular disorder, B12 deficiency, osteopenia who was admitted with progressive SOB requiring intubation.  COVID-19 positive.  Patient remains intubated on ventilator support MV: 7.4 L/min Temp (24hrs), Avg:98.3 F (36.8 C), Min:96.1 F (35.6 C), Max:102 F (38.9 C)   Labs reviewed. Phosphorus 2 (L) BUN 52 (H), creatinine 1.41 (H) CBG's: 227-224-229  Medications reviewed and include fentanyl, precedex, plaquenil.   Unable to complete NFPE at this time.  I/O -626 ml since admission, weight trending down.  Diet Order:   Diet Order            Diet NPO time specified  Diet effective now              EDUCATION NEEDS:   No education needs have been identified at this time  Skin:  Skin Assessment: (MASD buttocks & coccyx; blister to R foot)  Last BM:  none documented since admission  Height:   Ht Readings from Last 1 Encounters:  09/09/18 _0  (1.6 m)    Weight:   Wt Readings from Last 1 Encounters:  09/12/18 71.4 kg  Admission weight 78 kg  Ideal Body Weight:  52.3 kg  BMI:  Body mass index is 27.88 kg/m.  Estimated Nutritional Needs:   Kcal:  1640  Protein:  90-110 gm  Fluid:  1.6 L    Molli Barrows, RD, LDN, CNSC Pager [phone] After Hours Pager [phone]

## 2018-09-12 NOTE — Progress Notes (Signed)
OGT appears to be out of stomach. No sounds on auscultation with air. Fluid fluctuation noted in tube with respirations. TF stopped Md made aware. OGT replaced and xray ordered for confirmation.

## 2018-09-13 ENCOUNTER — Inpatient Hospital Stay (HOSPITAL_COMMUNITY): Payer: Medicare Other

## 2018-09-13 DIAGNOSIS — Z9911 Dependence on respirator [ventilator] status: Secondary | ICD-10-CM

## 2018-09-13 DIAGNOSIS — J1289 Other viral pneumonia: Secondary | ICD-10-CM

## 2018-09-13 LAB — BASIC METABOLIC PANEL
Anion gap: 11 (ref 5–15)
BUN: 70 mg/dL — ABNORMAL HIGH (ref 8–23)
CO2: 27 mmol/L (ref 22–32)
Calcium: 9 mg/dL (ref 8.9–10.3)
Chloride: 104 mmol/L (ref 98–111)
Creatinine, Ser: 1.63 mg/dL — ABNORMAL HIGH (ref 0.44–1.00)
GFR calc Af Amer: 35 mL/min — ABNORMAL LOW (ref >60)
GFR calc non Af Amer: 30 mL/min — ABNORMAL LOW (ref >60)
Glucose, Bld: 269 mg/dL — ABNORMAL HIGH (ref 70–99)
Potassium: 4.3 mmol/L (ref 3.5–5.1)
Sodium: 142 mmol/L (ref 135–145)

## 2018-09-13 LAB — GLUCOSE, CAPILLARY
Glucose-Capillary: 249 mg/dL — ABNORMAL HIGH (ref 70–99)
Glucose-Capillary: 259 mg/dL — ABNORMAL HIGH (ref 70–99)
Glucose-Capillary: 247 mg/dL — ABNORMAL HIGH (ref 70–99)
Glucose-Capillary: 227 mg/dL — ABNORMAL HIGH (ref 70–99)
Glucose-Capillary: 249 mg/dL — ABNORMAL HIGH (ref 70–99)
Glucose-Capillary: 270 mg/dL — ABNORMAL HIGH (ref 70–99)

## 2018-09-13 LAB — PHOSPHORUS: Phosphorus: 3.6 mg/dL (ref 2.5–4.6)

## 2018-09-13 LAB — MAGNESIUM: Magnesium: 2 mg/dL (ref 1.7–2.4)

## 2018-09-13 MED ORDER — CHLORHEXIDINE GLUCONATE CLOTH 2 % EX PADS
6.0000 | MEDICATED_PAD | Freq: Every day | CUTANEOUS | Status: DC
  Administered 2018-09-13 – 2018-10-11 (×28): 6 via TOPICAL

## 2018-09-13 MED ORDER — INSULIN GLARGINE 100 UNIT/ML ~~LOC~~ SOLN
5.0000 [IU] | Freq: Once | SUBCUTANEOUS | Status: AC
  Administered 2018-09-13: 5 [IU] via SUBCUTANEOUS
  Filled 2018-09-13: qty 0.05

## 2018-09-13 MED ORDER — FUROSEMIDE 10 MG/ML IJ SOLN
60.0000 mg | Freq: Four times a day (QID) | INTRAMUSCULAR | Status: DC
  Filled 2018-09-13: qty 6

## 2018-09-13 MED ORDER — FUROSEMIDE 10 MG/ML IJ SOLN
60.0000 mg | Freq: Four times a day (QID) | INTRAMUSCULAR | Status: AC
  Administered 2018-09-13 – 2018-09-14 (×3): 60 mg via INTRAVENOUS
  Filled 2018-09-13 (×2): qty 6

## 2018-09-13 MED ORDER — INSULIN GLARGINE 100 UNIT/ML ~~LOC~~ SOLN
15.0000 [IU] | Freq: Every day | SUBCUTANEOUS | Status: DC
  Administered 2018-09-14: 15 [IU] via SUBCUTANEOUS
  Filled 2018-09-13 (×2): qty 0.15

## 2018-09-13 NOTE — Plan of Care (Signed)
Pt tolerating tube feed at this time

## 2018-09-13 NOTE — Progress Notes (Signed)
PCCM:  I called and spoke with the patients daughter she was appreciative of the call.   FYI, multiple of the patients friends and church members are positive for COVID from their church in [place] area.   Garner Nash, DO [place] [facility] Critical Care 09/13/2018 2:06 PM

## 2018-09-13 NOTE — Progress Notes (Addendum)
NAME:  Sabrina Baker, MRN:  [id number], DOB:  [birth year], LOS: 4 ADMISSION DATE:  09/09/2018, CONSULTATION DATE:  09/09/18 REFERRING MD:  Regenia Skeeter - EM, CHIEF COMPLAINT:  SOB, diarrhea    Brief History   67 yoF presenting from home with c/o of cough, SOB, and abdominal pain x 2 weeks. Progressive hypoxia in ER now on NRB and CXR with bilateral infiltrates    History of present illness   78 year old female with history of HTN, HLD, DM, CKD stage 3, TIA, and neuropathy presenting with two history of generalized weakness, cough, shortness of breath (progressive over 1 week) and diarrhea.   She from home, lives alone.  Denies exposure to sick contact including COVID-19 or travel history.   She presented by EMS.  In ER, vitals noted for temp 99, normotensive and with rapidly increasing oxygen requirements in ER now on NRB.   Labs noted for Na 131, K 5.5, CO2 18, glucose 356, BUN 53, sCr 2.44 (baselien 1.99), BNP 295, Trop 0.33, WBC 14.4, Hgb 12.6,   Lactic acid 1.6, UA neg, EKG non-acute.  CXR noted for bilateral infiltrates.  Pending RVP and COVID-19 testing sent.  PCCM called for admit.   Past Medical History     CKD stage 3, DM, HLD, HTN, neuropathy, osteopenia, retinal detachment, TIA  Significant Hospital Events   3/30> admitted, intubated  Consults:    Procedures:  3/30 ETT >>  09/10/2018 right PIC>>  Significant Diagnostic Tests:  3/30 CXR >> Bilateral upper lobe airspace disease representing multifocal pneumonia versus pulmonary edema.   Micro Data:  3/30 BC x 2 >> 3/30 RVP >>neg 3/30 COVID-19 >> positive 3/30 Trach asp >> normal flora  3/30 urine strep >> negative 3/30 urine legionella >> negative  Antimicrobials:  3/30 azithro >> 3/30 ceftriaxone >> 09/10/2018 Plaquenil >>  Interim history/subjective:   FiO2 is now down to 50%.,  Continue diuresis noted to be -634 cc in 24 hours.  Continue to evaluate for possible extubation near future  Objective   Blood  pressure 124/65, pulse 91, temperature 98.4 F (36.9 C), temperature source Core (Comment), resp. rate 20, height 5\' 3"  (1.6 m), weight 69.5 kg, SpO2 96 %. CVP:  [3 mmHg-6 mmHg] 4 mmHg  Vent Mode: PRVC FiO2 (%):  [50 %] 50 % Set Rate:  [26 bmp] 26 bmp Vt Set:  [310 mL] 310 mL PEEP:  [8 cmH20] 8 cmH20 Plateau Pressure:  [15 cmH20-16 cmH20] 15 cmH20   Intake/Output Summary (Last 24 hours) at 09/13/2018 1017 Last data filed at 09/13/2018 0900 Gross per 24 hour  Intake 1693.13 ml  Output 1420 ml  Net 273.13 ml   Filed Weights   09/11/18 0400 09/12/18 0400 09/13/18 0500  Weight: 69.4 kg 71.4 kg 69.5 kg    Examination: General: Frail elderly female sedated with fentanyl HEENT: EndoTracheal tube in place gastric tube in place Neuro: Arouses to noxious stimuli, on fentanyl drip CV: Sounds are distant PULM: Rhonchi bilaterally with bibasilar crackles YF:VCBS, non-tender, bsx4 active  Extremities: warm/dry 1-2+ edema  Skin: Heel protectors in place       Resolved Hospital Problem list     Assessment & Plan:   Acute Respiratory Failure with Hypoxia Continue to wean FiO2 as tolerated Chest x-ray revealingpneumonia Positive COVID-19  P Intubated on 09/09/2018 09/13/2018 currently on FiO2 50% Repeat diuresis on 09/13/2018 Continue treatment for COVID-19 Continue Zithromax ,Plaquenil day 3 /5,  ceftriaxone and currently day 5 on 09/13/2018 we will  continue till day 7.  Note stop date was placed on antimicrobial therapy Culture data   CKD stage III Lab Results  Component Value Date   CREATININE 1.63 (H) 09/13/2018   CREATININE 1.41 (H) 09/12/2018   CREATININE 1.59 (H) 09/11/2018   Recent Labs  Lab 09/12/18 0340 09/12/18 0355 09/13/18 0516  K 5.2* 5.1 4.3     Intake/Output Summary (Last 24 hours) at 09/13/2018 1017 Last data filed at 09/13/2018 0900 Gross per 24 hour  Intake 1693.13 ml  Output 1420 ml  Net 273.13 ml   P Remains positive despite Lasix x2 doses on  09/12/2018 Increase Lasix to every 6x3 on 09/13/2018, note potassium 4.3  Elevated troponin Trop: 0.33 BNP 295 demand related in setting of hypoxia vs ACS P Continue cardiac monitor  HTN Home norvasc P Continue to hold home antihypertensives   DM2   P SSI  CBG (last 3)  Recent Labs    09/13/18 0100 09/13/18 0513 09/13/18 0756  GLUCAP 249* 259* 247*   Sliding scale insulin protocol Lantus 30 units on 09/13/2018  Hx Depression We will continue to hold Cymbalta while on IV sedation    Disposition: 09/13/2018 remains intensive care unit with COVID-19 positive  Best practice:  Diet: NPO, tube feeding tolerated Pain/Anxiety/Delirium protocol (if indicated): As needed VAP protocol (if indicated): Yes DVT prophylaxis: SQ Heparin  GI prophylaxis: protonix  Glucose control: SSI was added Lantus Mobility: bedrest Code Status: Full  Family Communication: 09/13/2018 no family at bedside Disposition:ICU   Labs   CBC: Recent Labs  Lab 09/09/18 1002  09/10/18 0437 09/11/18 0229 09/11/18 0401 09/12/18 0340 09/12/18 0355  WBC 14.4*  --  16.0* 12.2*  --   --  8.9  NEUTROABS 12.7*  --   --  11.3*  --   --   --   HGB 12.6   < > 12.6 12.2 11.9* 10.9* 10.6*  HCT 39.4   < > 40.4 38.0 35.0* 32.0* 34.0*  MCV 96.3  --  94.8 95.7  --   --  96.9  PLT 292  --  261 248  --   --  261   < > = values in this interval not displayed.    Basic Metabolic Panel: Recent Labs  Lab 09/09/18 1002  09/10/18 0437 09/11/18 0229 09/11/18 0401 09/12/18 0340 09/12/18 0355 09/13/18 0516  NA 131*   < > 139 140 140 143 141 142  K 5.5*   < > 5.0 4.1 3.9 5.2* 5.1 4.3  CL 97*  --  103 106  --   --  109 104  CO2 18*  --  25 24  --   --  24 27  GLUCOSE 356*  --  188* 204*  --   --  262* 269*  BUN 53*  --  51* 46*  --   --  52* 70*  CREATININE 2.44*  --  1.95* 1.59*  --   --  1.41* 1.63*  CALCIUM 8.4*  --  8.5* 8.3*  --   --  8.7* 9.0  MG  --   --   --  1.8  --   --  2.3 2.0  PHOS  --   --  2.8  3.5  --   --  2.0* 3.6   < > = values in this interval not displayed.   GFR: Estimated Creatinine Clearance: 27 mL/min (A) (by C-G formula based on SCr of 1.63 mg/dL (H)). Recent Labs  Lab 09/09/18 1002 09/09/18 1005 09/10/18 0437 09/11/18 0229 09/12/18 0355  WBC 14.4*  --  16.0* 12.2* 8.9  LATICACIDVEN  --  1.6  --   --   --     Liver Function Tests: Recent Labs  Lab 09/09/18 1002 09/10/18 0437  AST 97*  --   ALT 29  --   ALKPHOS 35*  --   BILITOT 0.8  --   PROT 7.0  --   ALBUMIN 2.7* 2.4*   No results for input(s): LIPASE, AMYLASE in the last 168 hours. No results for input(s): AMMONIA in the last 168 hours.  ABG    Component Value Date/Time   PHART 7.384 09/12/2018 0340   PCO2ART 42.8 09/12/2018 0340   PO2ART 56.0 (L) 09/12/2018 0340   HCO3 25.6 09/12/2018 0340   TCO2 27 09/12/2018 0340   ACIDBASEDEF 2.0 09/11/2018 0401   O2SAT 88.0 09/12/2018 0340     Coagulation Profile: No results for input(s): INR, PROTIME in the last 168 hours.  Cardiac Enzymes: Recent Labs  Lab 09/09/18 1002 09/09/18 1608 09/09/18 2155  TROPONINI 0.33* 0.10* 0.06*    HbA1C: Hgb A1c MFr Bld  Date/Time Value Ref Range Status  04/19/2012 03:43 PM 7.3 (H) <5.7 % Final    Comment:    (NOTE)                                                                       According to the ADA Clinical Practice Recommendations for 2011, when HbA1c is used as a screening test:  >=6.5%   Diagnostic of Diabetes Mellitus           (if abnormal result is confirmed) 5.7-6.4%   Increased risk of developing Diabetes Mellitus References:Diagnosis and Classification of Diabetes Mellitus,Diabetes YYTK,3546,56(CLEXN 1):S62-S69 and Standards of Medical Care in         Diabetes - 2011,Diabetes TZGY,1749,44 (Suppl 1):S11-S61.  04/18/2012 09:20 AM 7.3 (H) <5.7 % Final    Comment:    (NOTE)                                                                       According to the ADA Clinical Practice  Recommendations for 2011, when HbA1c is used as a screening test:  >=6.5%   Diagnostic of Diabetes Mellitus           (if abnormal result is confirmed) 5.7-6.4%   Increased risk of developing Diabetes Mellitus References:Diagnosis and Classification of Diabetes Mellitus,Diabetes HQPR,9163,84(YKZLD 1):S62-S69 and Standards of Medical Care in         Diabetes - 2011,Diabetes Care,2011,34 (Suppl 1):S11-S61.    CBG: Recent Labs  Lab 09/12/18 1615 09/12/18 2053 09/13/18 0100 09/13/18 0513 09/13/18 0756  GLUCAP 150* 277* 249* 259* 247*      Critical care time: 40 minutes     Richardson Landry Minor ACNP [facility] PCCM Pager [phone] till 1 pm If no answer page [phone] 09/13/2018, 10:17 AM    PCCM:  78 yo, COVID+, multilobar PNA, ARDS, on mechanical ventilation. Responding to diuresis. Weaning FiO2.  BP (!) 100/56   Pulse 90   Temp 98.8 F (37.1 C) (Core)   Resp (!) 26   Ht 5\' 3"  (1.6 m)   Wt 69.5 kg   SpO2 91%   BMI 27.14 kg/m   Gen: elderly FM, chronically ill appearing, intubated on mechanical ventilation, sedated Neuro: sedated on vent, no response to stimulus from nursing in room  Lungs: vent wave forms reviewed, BL ventilator delivered breaths Cardiac: tele reviewed, sinus rhythm   CXR: BL PNA, BL infiltrates, The patient's images have been independently reviewed by me.    A:  AHRF, intubated on MV, COVID-19 multilobar PNA, ARDS CKDIII NSTEMI, elevated troponin  HTN DM2 afib   P:  Intubated in ICU on vent  Wean fio2 as tolerated  Remains on ARDS protocol  Continue ABX and plaquenil  apixiban for afib   This patient is critically ill with multiple organ system failure; which, requires frequent high complexity decision making, assessment, support, evaluation, and titration of therapies. This was completed through the application of advanced monitoring technologies and extensive interpretation of multiple databases. During this encounter critical care time was  devoted to patient care services described in this note for 34 minutes.   Garner Nash, DO [facility] 09/13/2018 1:47 PM  Personal pager: [phone] If unanswered, please page CCM On-call: [phone]

## 2018-09-13 NOTE — Progress Notes (Signed)
Inpatient Diabetes Program Recommendations  AACE/ADA: New Consensus Statement on Inpatient Glycemic Control (2015)  Target Ranges:  Prepandial:   less than 140 mg/dL      Peak postprandial:   less than 180 mg/dL (1-2 hours)      Critically ill patients:  140 - 180 mg/dL   Lab Results  Component Value Date   GLUCAP 247 (H) 09/13/2018   HGBA1C 7.3 (H) 04/19/2012    Review of Glycemic Control Results for NOAM, FRANZEN (MRN [id number]) as of 09/13/2018 10:15  Ref. Range 09/13/2018 01:00 09/13/2018 05:13 09/13/2018 07:56  Glucose-Capillary Latest Ref Range: 70 - 99 mg/dL 249 (H) 259 (H) 247 (H)   Inpatient Diabetes Program Recommendations:    Note that blood sugars>goal. Consider adding Novolog tube feed coverage 3 units q 4 hours.    Thanks,  Adah Perl, RN, BC-ADM Inpatient Diabetes Coordinator Pager [phone] (8a-5p)

## 2018-09-14 ENCOUNTER — Inpatient Hospital Stay (HOSPITAL_COMMUNITY): Payer: Medicare Other

## 2018-09-14 LAB — PHOSPHORUS: Phosphorus: 3.8 mg/dL (ref 2.5–4.6)

## 2018-09-14 LAB — CULTURE, BLOOD (ROUTINE X 2)
Culture: NO GROWTH
Culture: NO GROWTH
Special Requests: ADEQUATE

## 2018-09-14 LAB — GLUCOSE, CAPILLARY
Glucose-Capillary: 220 mg/dL — ABNORMAL HIGH (ref 70–99)
Glucose-Capillary: 226 mg/dL — ABNORMAL HIGH (ref 70–99)
Glucose-Capillary: 237 mg/dL — ABNORMAL HIGH (ref 70–99)
Glucose-Capillary: 238 mg/dL — ABNORMAL HIGH (ref 70–99)
Glucose-Capillary: 254 mg/dL — ABNORMAL HIGH (ref 70–99)
Glucose-Capillary: 275 mg/dL — ABNORMAL HIGH (ref 70–99)
Glucose-Capillary: 284 mg/dL — ABNORMAL HIGH (ref 70–99)

## 2018-09-14 LAB — BASIC METABOLIC PANEL
Anion gap: 8 (ref 5–15)
BUN: 83 mg/dL — ABNORMAL HIGH (ref 8–23)
CO2: 30 mmol/L (ref 22–32)
Calcium: 8.8 mg/dL — ABNORMAL LOW (ref 8.9–10.3)
Chloride: 104 mmol/L (ref 98–111)
Creatinine, Ser: 1.82 mg/dL — ABNORMAL HIGH (ref 0.44–1.00)
GFR calc Af Amer: 31 mL/min — ABNORMAL LOW (ref >60)
GFR calc non Af Amer: 26 mL/min — ABNORMAL LOW (ref >60)
Glucose, Bld: 308 mg/dL — ABNORMAL HIGH (ref 70–99)
Potassium: 3.8 mmol/L (ref 3.5–5.1)
Sodium: 142 mmol/L (ref 135–145)

## 2018-09-14 LAB — MAGNESIUM: Magnesium: 1.9 mg/dL (ref 1.7–2.4)

## 2018-09-14 MED ORDER — FUROSEMIDE 10 MG/ML IJ SOLN
80.0000 mg | Freq: Four times a day (QID) | INTRAMUSCULAR | Status: AC
  Administered 2018-09-14 (×2): 80 mg via INTRAVENOUS
  Filled 2018-09-14 (×2): qty 8

## 2018-09-14 MED ORDER — INSULIN GLARGINE 100 UNIT/ML ~~LOC~~ SOLN
40.0000 [IU] | Freq: Every day | SUBCUTANEOUS | Status: DC
  Administered 2018-09-15: 40 [IU] via SUBCUTANEOUS
  Filled 2018-09-14 (×2): qty 0.4

## 2018-09-14 MED ORDER — INSULIN GLARGINE 100 UNIT/ML ~~LOC~~ SOLN
20.0000 [IU] | Freq: Every day | SUBCUTANEOUS | Status: DC
  Filled 2018-09-14: qty 0.2

## 2018-09-14 NOTE — Progress Notes (Signed)
ANTICOAGULATION CONSULT NOTE  Pharmacy Consult for apixaban Indication: atrial fibrillation   Patient Measurements: Height: 5\' 3"  (160 cm) Weight: 155 lb 13.8 oz (70.7 kg) IBW/kg (Calculated) : 52.4   Vital Signs: Temp: 99.5 F (37.5 C) (04/04 0800) Temp Source: Rectal (04/04 0800) BP: 103/57 (04/04 1200) Pulse Rate: 84 (04/04 1200)  Labs: Recent Labs    09/12/18 0340 09/12/18 0355 09/13/18 0516 09/14/18 0410  HGB 10.9* 10.6*  --   --   HCT 32.0* 34.0*  --   --   PLT  --  261  --   --   CREATININE  --  1.41* 1.63* 1.82*     Medical History: Past Medical History:  Diagnosis Date  . Chronic kidney disease    stage 3  . Diabetes mellitus without complication ([place])   . Hypercholesteremia   . Hypertension   . Neuromuscular disorder ([facility])    neuropathy  . Osteopenia   . Retinal detachment   . TIA (transient ischemic attack)   . Vitamin B12 deficiency     Assessment:  Lakysha is in the ICU for COVID-19 +. She has a hx TIA and was found to be in afib tonight. She has had a bump in her SCr 1.4 >1.8. Given her renal fx, age and weight I will continue the normal apixaban dose.  She is on plaquenil/azith for COVID-19.   Goal of Therapy:  Monitor platelets by anticoagulation protocol: Yes   Plan:  Apixaban 5mg  PO BID    Harvel Quale 09/14/2018 12:13 PM

## 2018-09-14 NOTE — Progress Notes (Signed)
NAME:  Sabrina Baker, MRN:  [id number], DOB:  [birth year], LOS: 5 ADMISSION DATE:  09/09/2018, CONSULTATION DATE:  09/09/18 REFERRING MD:  Regenia Skeeter - EM, CHIEF COMPLAINT:  SOB, diarrhea    Brief History   7 yoF presenting from home with c/o of cough, SOB, and abdominal pain x 2 weeks. Progressive hypoxia in ER now on NRB and CXR with bilateral infiltrates    History of present illness   78 year old female with history of HTN, HLD, DM, CKD stage 3, TIA, and neuropathy presenting with two history of generalized weakness, cough, shortness of breath (progressive over 1 week) and diarrhea.   She from home, lives alone.  Denies exposure to sick contact including COVID-19 or travel history.   She presented by EMS.  In ER, vitals noted for temp 99, normotensive and with rapidly increasing oxygen requirements in ER now on NRB.   Labs noted for Na 131, K 5.5, CO2 18, glucose 356, BUN 53, sCr 2.44 (baselien 1.99), BNP 295, Trop 0.33, WBC 14.4, Hgb 12.6,   Lactic acid 1.6, UA neg, EKG non-acute.  CXR noted for bilateral infiltrates.  Pending RVP and COVID-19 testing sent.  PCCM called for admit.   Past Medical History     CKD stage 3, DM, HLD, HTN, neuropathy, osteopenia, retinal detachment, TIA  Significant Hospital Events   3/30> admitted, intubated  Consults:    Procedures:  3/30 ETT >>  09/10/2018 right PIC>>  Significant Diagnostic Tests:  3/30 CXR >> Bilateral upper lobe airspace disease representing multifocal pneumonia versus pulmonary edema.   Micro Data:  3/30 BC x 2 >> 3/30 RVP >>neg 3/30 COVID-19 >> positive 3/30 Trach asp >> normal flora  3/30 urine strep >> negative 3/30 urine legionella >> negative  Antimicrobials:  3/30 azithro >> 3/30 ceftriaxone >> 09/10/2018 Plaquenil >>  Interim history/subjective:   We will continue diuresis.  Continue to wean FiO2.  Attempt  extubation when able  Objective   Blood pressure (!) 103/57, pulse 84, temperature 99.5 F  (37.5 C), temperature source Rectal, resp. rate 20, height 5\' 3"  (1.6 m), weight 70.7 kg, SpO2 96 %. CVP:  [2 mmHg-5 mmHg] 2 mmHg  Vent Mode: PSV;CPAP FiO2 (%):  [40 %-50 %] 50 % Set Rate:  [26 bmp] 26 bmp Vt Set:  [310 mL] 310 mL PEEP:  [8 cmH20] 8 cmH20 Pressure Support:  [10 cmH20] 10 cmH20 Plateau Pressure:  [15 cmH20-16 cmH20] 15 cmH20   Intake/Output Summary (Last 24 hours) at 09/14/2018 1222 Last data filed at 09/14/2018 0900 Gross per 24 hour  Intake 1682.17 ml  Output 1430 ml  Net 252.17 ml   Filed Weights   09/12/18 0400 09/13/18 0500 09/14/18 0500  Weight: 71.4 kg 69.5 kg 70.7 kg    Examination: General: Elderly female who is poorly responsive HEENT: Endotracheal tube and gastric tube are in place Neuro: Poorly responsive CV: Heart sounds are regular at this time PULM: even/non-labored, lungs bilaterally mild rhonchi FY:BOFB, non-tender, bsx4 active  Extremities: warm/dry, 1+ edema  Skin: no rashes or lesions, heel protectors are in place        Resolved Hospital Problem list     Assessment & Plan:   Acute Respiratory Failure with Hypoxia Continue to wean FiO2 as tolerated Chest x-ray revealingpneumonia Positive COVID-19  P  Intubated on 09/09/2018 09/14/2018  decreased FiO2 to 40% Assessment for extubation.  CKD stage III Lab Results  Component Value Date   CREATININE 1.82 (H) 09/14/2018  CREATININE 1.63 (H) 09/13/2018   CREATININE 1.41 (H) 09/12/2018   Recent Labs  Lab 09/12/18 0355 09/13/18 0516 09/14/18 0410  K 5.1 4.3 3.8     Intake/Output Summary (Last 24 hours) at 09/14/2018 1222 Last data filed at 09/14/2018 0900 Gross per 24 hour  Intake 1682.17 ml  Output 1430 ml  Net 252.17 ml   P Continues to remain positive despite increase diuretics. Continue diuresis as able note BUN is risen to 83  Elevated troponin Trop: 0.33 BNP 295 demand related in setting of hypoxia vs ACS P Continue to monitor cardiac  HTN Home norvasc P  Continue to monitor blood pressure hold antihypertensives at this time.   DM2   P SSI  CBG (last 3)  Recent Labs    09/14/18 0029 09/14/18 0423 09/14/18 0835  GLUCAP 254* 284* 275*   Sliding scale insulin protocol Lantus increased to 20 units on 09/14/2018  Hx Depression Holding Cymbalta at this time    Disposition: 08/2018 remains intensive care with COVID-19 positive.  We are attempting diuresis.  Daily assessment for extubation.  Best practice:  Diet: NPO, tube feeding tolerated Pain/Anxiety/Delirium protocol (if indicated): As needed VAP protocol (if indicated): Yes DVT prophylaxis: SQ Heparin  GI prophylaxis: protonix  Glucose control: SSI was creased Lantus 09/14/1998 Mobility: bedrest Code Status: Full  Family Communication: 09/14/2023 was updated 09/13/2018 by Dr. Valeta Harms Disposition:ICU   Labs   CBC: Recent Labs  Lab 09/09/18 1002  09/10/18 0437 09/11/18 0229 09/11/18 0401 09/12/18 0340 09/12/18 0355  WBC 14.4*  --  16.0* 12.2*  --   --  8.9  NEUTROABS 12.7*  --   --  11.3*  --   --   --   HGB 12.6   < > 12.6 12.2 11.9* 10.9* 10.6*  HCT 39.4   < > 40.4 38.0 35.0* 32.0* 34.0*  MCV 96.3  --  94.8 95.7  --   --  96.9  PLT 292  --  261 248  --   --  261   < > = values in this interval not displayed.    Basic Metabolic Panel: Recent Labs  Lab 09/10/18 0437 09/11/18 0229 09/11/18 0401 09/12/18 0340 09/12/18 0355 09/13/18 0516 09/14/18 0410  NA 139 140 140 143 141 142 142  K 5.0 4.1 3.9 5.2* 5.1 4.3 3.8  CL 103 106  --   --  109 104 104  CO2 25 24  --   --  24 27 30   GLUCOSE 188* 204*  --   --  262* 269* 308*  BUN 51* 46*  --   --  52* 70* 83*  CREATININE 1.95* 1.59*  --   --  1.41* 1.63* 1.82*  CALCIUM 8.5* 8.3*  --   --  8.7* 9.0 8.8*  MG  --  1.8  --   --  2.3 2.0 1.9  PHOS 2.8 3.5  --   --  2.0* 3.6 3.8   GFR: Estimated Creatinine Clearance: 24.4 mL/min (A) (by C-G formula based on SCr of 1.82 mg/dL (H)). Recent Labs  Lab 09/09/18 1002  09/09/18 1005 09/10/18 0437 09/11/18 0229 09/12/18 0355  WBC 14.4*  --  16.0* 12.2* 8.9  LATICACIDVEN  --  1.6  --   --   --     Liver Function Tests: Recent Labs  Lab 09/09/18 1002 09/10/18 0437  AST 97*  --   ALT 29  --   ALKPHOS 35*  --  BILITOT 0.8  --   PROT 7.0  --   ALBUMIN 2.7* 2.4*   No results for input(s): LIPASE, AMYLASE in the last 168 hours. No results for input(s): AMMONIA in the last 168 hours.  ABG    Component Value Date/Time   PHART 7.384 09/12/2018 0340   PCO2ART 42.8 09/12/2018 0340   PO2ART 56.0 (L) 09/12/2018 0340   HCO3 25.6 09/12/2018 0340   TCO2 27 09/12/2018 0340   ACIDBASEDEF 2.0 09/11/2018 0401   O2SAT 88.0 09/12/2018 0340     Coagulation Profile: No results for input(s): INR, PROTIME in the last 168 hours.  Cardiac Enzymes: Recent Labs  Lab 09/09/18 1002 09/09/18 1608 09/09/18 2155  TROPONINI 0.33* 0.10* 0.06*    HbA1C: Hgb A1c MFr Bld  Date/Time Value Ref Range Status  04/19/2012 03:43 PM 7.3 (H) <5.7 % Final    Comment:    (NOTE)                                                                       According to the ADA Clinical Practice Recommendations for 2011, when HbA1c is used as a screening test:  >=6.5%   Diagnostic of Diabetes Mellitus           (if abnormal result is confirmed) 5.7-6.4%   Increased risk of developing Diabetes Mellitus References:Diagnosis and Classification of Diabetes Mellitus,Diabetes UJWJ,1914,78(GNFAO 1):S62-S69 and Standards of Medical Care in         Diabetes - 2011,Diabetes ZHYQ,6578,46 (Suppl 1):S11-S61.  04/18/2012 09:20 AM 7.3 (H) <5.7 % Final    Comment:    (NOTE)                                                                       According to the ADA Clinical Practice Recommendations for 2011, when HbA1c is used as a screening test:  >=6.5%   Diagnostic of Diabetes Mellitus           (if abnormal result is confirmed) 5.7-6.4%   Increased risk of developing Diabetes Mellitus  References:Diagnosis and Classification of Diabetes Mellitus,Diabetes NGEX,5284,13(KGMWN 1):S62-S69 and Standards of Medical Care in         Diabetes - 2011,Diabetes Care,2011,34 (Suppl 1):S11-S61.    CBG: Recent Labs  Lab 09/13/18 1508 09/13/18 2034 09/14/18 0029 09/14/18 0423 09/14/18 0835  GLUCAP 249* 270* 254* 284* 275*      Critical care time: 40 minutes     Richardson Landry Minor ACNP [facility] PCCM Pager [phone] till 1 pm If no answer page [phone] 09/14/2018, 12:22 PM

## 2018-09-15 ENCOUNTER — Inpatient Hospital Stay (HOSPITAL_COMMUNITY): Payer: Medicare Other

## 2018-09-15 LAB — BASIC METABOLIC PANEL
Anion gap: 9 (ref 5–15)
BUN: 101 mg/dL — ABNORMAL HIGH (ref 8–23)
CO2: 32 mmol/L (ref 22–32)
Calcium: 9 mg/dL (ref 8.9–10.3)
Chloride: 101 mmol/L (ref 98–111)
Creatinine, Ser: 2.03 mg/dL — ABNORMAL HIGH (ref 0.44–1.00)
GFR calc Af Amer: 27 mL/min — ABNORMAL LOW (ref >60)
GFR calc non Af Amer: 23 mL/min — ABNORMAL LOW (ref >60)
Glucose, Bld: 244 mg/dL — ABNORMAL HIGH (ref 70–99)
Potassium: 4.2 mmol/L (ref 3.5–5.1)
Sodium: 142 mmol/L (ref 135–145)

## 2018-09-15 LAB — GLUCOSE, CAPILLARY
Glucose-Capillary: 224 mg/dL — ABNORMAL HIGH (ref 70–99)
Glucose-Capillary: 235 mg/dL — ABNORMAL HIGH (ref 70–99)
Glucose-Capillary: 236 mg/dL — ABNORMAL HIGH (ref 70–99)
Glucose-Capillary: 273 mg/dL — ABNORMAL HIGH (ref 70–99)
Glucose-Capillary: 295 mg/dL — ABNORMAL HIGH (ref 70–99)
Glucose-Capillary: 311 mg/dL — ABNORMAL HIGH (ref 70–99)

## 2018-09-15 LAB — MAGNESIUM: Magnesium: 2 mg/dL (ref 1.7–2.4)

## 2018-09-15 LAB — PHOSPHORUS: Phosphorus: 4.5 mg/dL (ref 2.5–4.6)

## 2018-09-15 MED ORDER — ALTEPLASE 2 MG IJ SOLR
2.0000 mg | Freq: Once | INTRAMUSCULAR | Status: AC
  Administered 2018-09-15: 2 mg
  Filled 2018-09-15 (×2): qty 2

## 2018-09-15 MED ORDER — INSULIN GLARGINE 100 UNIT/ML ~~LOC~~ SOLN
45.0000 [IU] | Freq: Every day | SUBCUTANEOUS | Status: DC
  Administered 2018-09-16: 45 [IU] via SUBCUTANEOUS
  Filled 2018-09-15 (×2): qty 0.45

## 2018-09-15 NOTE — Progress Notes (Signed)
NAME:  Sabrina Baker, MRN:  [id number], DOB:  [birth year], LOS: 6 ADMISSION DATE:  09/09/2018, CONSULTATION DATE:  09/09/18 REFERRING MD:  Regenia Skeeter - EM, CHIEF COMPLAINT:  SOB, diarrhea    Brief History   60 yoF presenting from home with c/o of cough, SOB, and abdominal pain x 2 weeks. Progressive hypoxia in ER now on NRB and CXR with bilateral infiltrates    History of present illness   78 year old female with history of HTN, HLD, DM, CKD stage 3, TIA, and neuropathy presenting with two history of generalized weakness, cough, shortness of breath (progressive over 1 week) and diarrhea.   She from home, lives alone.  Denies exposure to sick contact including COVID-19 or travel history.   She presented by EMS.  In ER, vitals noted for temp 99, normotensive and with rapidly increasing oxygen requirements in ER now on NRB.   Labs noted for Na 131, K 5.5, CO2 18, glucose 356, BUN 53, sCr 2.44 (baselien 1.99), BNP 295, Trop 0.33, WBC 14.4, Hgb 12.6,   Lactic acid 1.6, UA neg, EKG non-acute.  CXR noted for bilateral infiltrates.  Pending RVP and COVID-19 testing sent.  PCCM called for admit.   Past Medical History     CKD stage 3, DM, HLD, HTN, neuropathy, osteopenia, retinal detachment, TIA  Significant Hospital Events   3/30> admitted, intubated  Consults:  09/15/2018 palliative care is now involved in his care and recommendations will be followed.  Procedures:  3/30 ETT >>  09/10/2018 right PIC>>  Significant Diagnostic Tests:  3/30 CXR >> Bilateral upper lobe airspace disease representing multifocal pneumonia versus pulmonary edema.   Micro Data:  3/30 BC x 2 >> negative 3/30 RVP >>neg 3/30 COVID-19 >> positive 3/30 Trach asp >> normal flora  3/30 urine strep >> negative 3/30 urine legionella >> negative  Antimicrobials:  3/30 azithro >> 3/30 ceftriaxone >> completed 09/10/2018 Plaquenil >> completed  Interim history/subjective:   Not been diuresing BUN over 100   Objective   Blood pressure (!) 152/61, pulse 94, temperature 99 F (37.2 C), temperature source Oral, resp. rate (!) 25, height 5\' 3"  (1.6 m), weight 72.8 kg, SpO2 94 %. CVP:  [0 mmHg-4 mmHg] 0 mmHg  Vent Mode: PRVC FiO2 (%):  [40 %-50 %] 50 % Set Rate:  [25 bmp-26 bmp] 26 bmp Vt Set:  [310 mL] 310 mL PEEP:  [8 cmH20] 8 cmH20 Plateau Pressure:  [14 cmH20-16 cmH20] 15 cmH20   Intake/Output Summary (Last 24 hours) at 09/15/2018 1106 Last data filed at 09/15/2018 0900 Gross per 24 hour  Intake 2659.44 ml  Output 810 ml  Net 1849.44 ml   Filed Weights   09/13/18 0500 09/14/18 0500 09/15/18 0409  Weight: 69.5 kg 70.7 kg 72.8 kg    Examination: General: Frail elderly female who is poorly responsive HEENT: Endotracheal and gastric tube in place Neuro: Poorly responsive CV: Distant PULM: even/non-labored, lungs bilaterally diminished throughout TM:LYYT, non-tender, bsx4 active  Extremities: warm/dry, 1+ edema, heel protectors in place Skin: no rashes or lesions         Resolved Hospital Problem list     Assessment & Plan:   Acute Respiratory Failure with Hypoxia Continue to wean FiO2 as tolerated Chest x-ray revealingpneumonia Positive COVID-19  P  Intubated 09/09/2018 Unable to decrease FiO2 below 50%  CKD stage III Lab Results  Component Value Date   CREATININE 2.03 (H) 09/15/2018   CREATININE 1.82 (H) 09/14/2018   CREATININE 1.63 (H)  09/13/2018   Recent Labs  Lab 09/13/18 0516 09/14/18 0410 09/15/18 0406  K 4.3 3.8 4.2     Intake/Output Summary (Last 24 hours) at 09/15/2018 1106 Last data filed at 09/15/2018 0900 Gross per 24 hour  Intake 2659.44 ml  Output 810 ml  Net 1849.44 ml   P Continue to remain positive despite increases in diuresis over the last 3 days  Is now up to 100 creatinine is risen to over 2 therefore no further diuresis at this time  Elevated troponin Trop: 0.33 BNP 295 demand related in setting of hypoxia vs ACS P Cardiac  monitoring  HTN Home norvasc P Continue to monitor blood pressure no need for antihypertensives at this time   DM2   P SSI  CBG (last 3)  Recent Labs    09/14/18 2349 09/15/18 0347 09/15/18 0849  GLUCAP 220* 224* 235*   Sliding scale insulin protocol Lantus increased to 45 units on 09/15/2018  Hx Depression Holding Cymbalta    Disposition: 09/15/2018 remains in intensive care COVID-19 hours.  Despite aggressive attempts at diuresis patient weanable from ventilator.  FiO2 remains at 50%.  Poorly responsive.  Best practice:  Diet: NPO, tube feeding tolerated Pain/Anxiety/Delirium protocol (if indicated): As needed VAP protocol (if indicated): Yes DVT prophylaxis: SQ Heparin  GI prophylaxis: protonix  Glucose control: SSI was creased Lantus 09/14/1998 Mobility: bedrest Code Status: Full  Family Communication: 09/15/2018 no family at bedside Disposition:ICU, note 09/15/2018 palliative care involved in care  Labs   CBC: Recent Labs  Lab 09/09/18 1002  09/10/18 0437 09/11/18 0229 09/11/18 0401 09/12/18 0340 09/12/18 0355  WBC 14.4*  --  16.0* 12.2*  --   --  8.9  NEUTROABS 12.7*  --   --  11.3*  --   --   --   HGB 12.6   < > 12.6 12.2 11.9* 10.9* 10.6*  HCT 39.4   < > 40.4 38.0 35.0* 32.0* 34.0*  MCV 96.3  --  94.8 95.7  --   --  96.9  PLT 292  --  261 248  --   --  261   < > = values in this interval not displayed.    Basic Metabolic Panel: Recent Labs  Lab 09/11/18 0229  09/12/18 0340 09/12/18 0355 09/13/18 0516 09/14/18 0410 09/15/18 0406  NA 140   < > 143 141 142 142 142  K 4.1   < > 5.2* 5.1 4.3 3.8 4.2  CL 106  --   --  109 104 104 101  CO2 24  --   --  24 27 30  32  GLUCOSE 204*  --   --  262* 269* 308* 244*  BUN 46*  --   --  52* 70* 83* 101*  CREATININE 1.59*  --   --  1.41* 1.63* 1.82* 2.03*  CALCIUM 8.3*  --   --  8.7* 9.0 8.8* 9.0  MG 1.8  --   --  2.3 2.0 1.9 2.0  PHOS 3.5  --   --  2.0* 3.6 3.8 4.5   < > = values in this interval not  displayed.   GFR: Estimated Creatinine Clearance: 22.2 mL/min (A) (by C-G formula based on SCr of 2.03 mg/dL (H)). Recent Labs  Lab 09/09/18 1002 09/09/18 1005 09/10/18 0437 09/11/18 0229 09/12/18 0355  WBC 14.4*  --  16.0* 12.2* 8.9  LATICACIDVEN  --  1.6  --   --   --     Liver  Function Tests: Recent Labs  Lab 09/09/18 1002 09/10/18 0437  AST 97*  --   ALT 29  --   ALKPHOS 35*  --   BILITOT 0.8  --   PROT 7.0  --   ALBUMIN 2.7* 2.4*   No results for input(s): LIPASE, AMYLASE in the last 168 hours. No results for input(s): AMMONIA in the last 168 hours.  ABG    Component Value Date/Time   PHART 7.384 09/12/2018 0340   PCO2ART 42.8 09/12/2018 0340   PO2ART 56.0 (L) 09/12/2018 0340   HCO3 25.6 09/12/2018 0340   TCO2 27 09/12/2018 0340   ACIDBASEDEF 2.0 09/11/2018 0401   O2SAT 88.0 09/12/2018 0340     Coagulation Profile: No results for input(s): INR, PROTIME in the last 168 hours.  Cardiac Enzymes: Recent Labs  Lab 09/09/18 1002 09/09/18 1608 09/09/18 2155  TROPONINI 0.33* 0.10* 0.06*    HbA1C: Hgb A1c MFr Bld  Date/Time Value Ref Range Status  04/19/2012 03:43 PM 7.3 (H) <5.7 % Final    Comment:    (NOTE)                                                                       According to the ADA Clinical Practice Recommendations for 2011, when HbA1c is used as a screening test:  >=6.5%   Diagnostic of Diabetes Mellitus           (if abnormal result is confirmed) 5.7-6.4%   Increased risk of developing Diabetes Mellitus References:Diagnosis and Classification of Diabetes Mellitus,Diabetes VZDG,3875,64(PPIRJ 1):S62-S69 and Standards of Medical Care in         Diabetes - 2011,Diabetes JOAC,1660,63 (Suppl 1):S11-S61.  04/18/2012 09:20 AM 7.3 (H) <5.7 % Final    Comment:    (NOTE)                                                                       According to the ADA Clinical Practice Recommendations for 2011, when HbA1c is used as a screening  test:  >=6.5%   Diagnostic of Diabetes Mellitus           (if abnormal result is confirmed) 5.7-6.4%   Increased risk of developing Diabetes Mellitus References:Diagnosis and Classification of Diabetes Mellitus,Diabetes KZSW,1093,23(FTDDU 1):S62-S69 and Standards of Medical Care in         Diabetes - 2011,Diabetes Care,2011,34 (Suppl 1):S11-S61.    CBG: Recent Labs  Lab 09/14/18 1758 09/14/18 1955 09/14/18 2349 09/15/18 0347 09/15/18 0849  GLUCAP 238* 226* 220* 224* 235*      Critical care time: 30 minutes     Richardson Landry Enrigue Hashimi ACNP [facility] PCCM Pager [phone] till 1 pm If no answer page [phone] 09/15/2018, 11:06 AM

## 2018-09-16 ENCOUNTER — Encounter (INDEPENDENT_AMBULATORY_CARE_PROVIDER_SITE_OTHER): Payer: Medicare Other | Admitting: Ophthalmology

## 2018-09-16 ENCOUNTER — Inpatient Hospital Stay (HOSPITAL_COMMUNITY): Payer: Medicare Other

## 2018-09-16 LAB — HEPATIC FUNCTION PANEL
ALT: 9 U/L (ref 0–44)
AST: 16 U/L (ref 15–41)
Albumin: 1.3 g/dL — ABNORMAL LOW (ref 3.5–5.0)
Alkaline Phosphatase: 38 U/L (ref 38–126)
Bilirubin, Direct: <0.1 mg/dL (ref 0.0–0.2)
Total Bilirubin: 0.4 mg/dL (ref 0.3–1.2)
Total Protein: 5.1 g/dL — ABNORMAL LOW (ref 6.5–8.1)

## 2018-09-16 LAB — POCT I-STAT 7, (LYTES, BLD GAS, ICA,H+H)
Acid-Base Excess: 10 mmol/L — ABNORMAL HIGH (ref 0.0–2.0)
Bicarbonate: 35.1 mmol/L — ABNORMAL HIGH (ref 20.0–28.0)
Calcium, Ion: 1.33 mmol/L (ref 1.15–1.40)
HCT: 30 % — ABNORMAL LOW (ref 36.0–46.0)
Hemoglobin: 10.2 g/dL — ABNORMAL LOW (ref 12.0–15.0)
O2 Saturation: 95 %
Patient temperature: 100.3
Potassium: 4.4 mmol/L (ref 3.5–5.1)
Sodium: 142 mmol/L (ref 135–145)
TCO2: 37 mmol/L — ABNORMAL HIGH (ref 22–32)
pCO2 arterial: 50.2 mmHg — ABNORMAL HIGH (ref 32.0–48.0)
pH, Arterial: 7.456 — ABNORMAL HIGH (ref 7.350–7.450)
pO2, Arterial: 77 mmHg — ABNORMAL LOW (ref 83.0–108.0)

## 2018-09-16 LAB — CBC WITH DIFFERENTIAL/PLATELET
Abs Immature Granulocytes: 0.22 10*3/uL — ABNORMAL HIGH (ref 0.00–0.07)
Basophils Absolute: 0 10*3/uL (ref 0.0–0.1)
Basophils Relative: 0 %
Eosinophils Absolute: 0.1 10*3/uL (ref 0.0–0.5)
Eosinophils Relative: 1 %
HCT: 33 % — ABNORMAL LOW (ref 36.0–46.0)
Hemoglobin: 9.8 g/dL — ABNORMAL LOW (ref 12.0–15.0)
Immature Granulocytes: 2 %
Lymphocytes Relative: 5 %
Lymphs Abs: 0.6 10*3/uL — ABNORMAL LOW (ref 0.7–4.0)
MCH: 29.3 pg (ref 26.0–34.0)
MCHC: 29.7 g/dL — ABNORMAL LOW (ref 30.0–36.0)
MCV: 98.8 fL (ref 80.0–100.0)
Monocytes Absolute: 0.6 10*3/uL (ref 0.1–1.0)
Monocytes Relative: 5 %
Neutro Abs: 10.4 10*3/uL — ABNORMAL HIGH (ref 1.7–7.7)
Neutrophils Relative %: 87 %
Platelets: 312 10*3/uL (ref 150–400)
RBC: 3.34 MIL/uL — ABNORMAL LOW (ref 3.87–5.11)
RDW: 14.8 % (ref 11.5–15.5)
WBC: 11.9 10*3/uL — ABNORMAL HIGH (ref 4.0–10.5)
nRBC: 0 % (ref 0.0–0.2)

## 2018-09-16 LAB — BASIC METABOLIC PANEL
Anion gap: 10 (ref 5–15)
BUN: 113 mg/dL — ABNORMAL HIGH (ref 8–23)
CO2: 31 mmol/L (ref 22–32)
Calcium: 9.2 mg/dL (ref 8.9–10.3)
Chloride: 104 mmol/L (ref 98–111)
Creatinine, Ser: 2.1 mg/dL — ABNORMAL HIGH (ref 0.44–1.00)
GFR calc Af Amer: 26 mL/min — ABNORMAL LOW (ref >60)
GFR calc non Af Amer: 22 mL/min — ABNORMAL LOW (ref >60)
Glucose, Bld: 204 mg/dL — ABNORMAL HIGH (ref 70–99)
Potassium: 4.3 mmol/L (ref 3.5–5.1)
Sodium: 145 mmol/L (ref 135–145)

## 2018-09-16 LAB — GLUCOSE, CAPILLARY
Glucose-Capillary: 180 mg/dL — ABNORMAL HIGH (ref 70–99)
Glucose-Capillary: 240 mg/dL — ABNORMAL HIGH (ref 70–99)
Glucose-Capillary: 320 mg/dL — ABNORMAL HIGH (ref 70–99)
Glucose-Capillary: 286 mg/dL — ABNORMAL HIGH (ref 70–99)
Glucose-Capillary: 232 mg/dL — ABNORMAL HIGH (ref 70–99)
Glucose-Capillary: 247 mg/dL — ABNORMAL HIGH (ref 70–99)

## 2018-09-16 LAB — MAGNESIUM: Magnesium: 2.2 mg/dL (ref 1.7–2.4)

## 2018-09-16 LAB — PHOSPHORUS: Phosphorus: 4.2 mg/dL (ref 2.5–4.6)

## 2018-09-16 MED ORDER — INSULIN ASPART 100 UNIT/ML ~~LOC~~ SOLN
3.0000 [IU] | SUBCUTANEOUS | Status: DC
  Administered 2018-09-16 (×3): 3 [IU] via SUBCUTANEOUS

## 2018-09-16 MED ORDER — INSULIN ASPART 100 UNIT/ML ~~LOC~~ SOLN
3.0000 [IU] | SUBCUTANEOUS | Status: DC
  Administered 2018-09-16 – 2018-09-17 (×4): 3 [IU] via SUBCUTANEOUS

## 2018-09-16 MED ORDER — DOCUSATE SODIUM 50 MG/5ML PO LIQD
100.0000 mg | Freq: Two times a day (BID) | ORAL | Status: DC
  Administered 2018-09-17 – 2018-09-24 (×9): 100 mg
  Filled 2018-09-16 (×10): qty 10

## 2018-09-16 MED ORDER — INSULIN GLARGINE 100 UNIT/ML ~~LOC~~ SOLN
45.0000 [IU] | SUBCUTANEOUS | Status: DC
  Administered 2018-09-17 – 2018-09-18 (×2): 45 [IU] via SUBCUTANEOUS
  Filled 2018-09-16 (×3): qty 0.45

## 2018-09-16 MED ORDER — FENTANYL 50 MCG/HR TD PT72
1.0000 | MEDICATED_PATCH | TRANSDERMAL | Status: DC
  Administered 2018-09-16 – 2018-09-22 (×3): 1 via TRANSDERMAL
  Filled 2018-09-16 (×3): qty 1

## 2018-09-16 MED ORDER — LABETALOL HCL 5 MG/ML IV SOLN
10.0000 mg | INTRAVENOUS | Status: AC | PRN
  Administered 2018-09-16 – 2018-09-25 (×10): 10 mg via INTRAVENOUS
  Filled 2018-09-16 (×14): qty 4

## 2018-09-16 NOTE — Progress Notes (Signed)
Pt's BP is elevated at 181/67. Elink made aware. Will continue to assess.

## 2018-09-16 NOTE — Progress Notes (Addendum)
NAME:  Sabrina Baker, MRN:  [id number], DOB:  1940-10-26, LOS: 7 ADMISSION DATE:  09/09/2018, CONSULTATION DATE:  09/09/18 REFERRING MD:  Regenia Skeeter - EM, CHIEF COMPLAINT:  SOB, diarrhea    Brief History   78 yoF presenting from home with c/o of cough, SOB, and abdominal pain x 2 weeks. Progressive hypoxia in ER now on NRB and CXR with bilateral infiltrates   Significant Hospital Events   3/30> admitted, intubated  Consults:  4/5 palliative care   Procedures:  3/30 ETT >>  09/10/2018 right PICC>  Significant Diagnostic Tests:  3/30 CXR >> Bilateral upper lobe airspace disease representing multifocal pneumonia versus pulmonary edema.  Micro Data:  3/30 BC x 2 > negative 3/30 RVP > neg 3/30 COVID-19 > positive 3/30 Trach asp >> normal flora 3/30 urine strep >> negative  3/30 urine legionella >> negative  Antimicrobials:  3/30 azithro >> 3/30 ceftriaxone >> completed 09/10/2018 Plaquenil >> completed  Interim history/subjective:  Hypertensive overnight treated with PRN labetalol.   Objective   Blood pressure (!) 152/60, pulse (!) 112, temperature 99.4 F (37.4 C), temperature source Oral, resp. rate (!) 27, height 5\' 3"  (1.6 m), weight 74.4 kg, SpO2 98 %. CVP:  [2 mmHg-9 mmHg] 9 mmHg  Vent Mode: PRVC FiO2 (%):  [50 %] 50 % Set Rate:  [22 bmp-26 bmp] 22 bmp Vt Set:  [310 mL] 310 mL PEEP:  [8 cmH20] 8 cmH20 Plateau Pressure:  [15 cmH20-16 cmH20] 16 cmH20   Intake/Output Summary (Last 24 hours) at 09/16/2018 1408 Last data filed at 09/16/2018 1300 Gross per 24 hour  Intake 975.85 ml  Output 980 ml  Net -4.15 ml   Filed Weights   09/14/18 0500 09/15/18 0409 09/16/18 0401  Weight: 70.7 kg 72.8 kg 74.4 kg    Examination:  General: Elderly female on vent.  HEENT: ETT in place. PERRL. Rhythmic head movements/twitching left to right.  Neuro: Minimally responsive on sedation infuison.  CV: RRR, no MRG PULM: Clear EL:FYBO, non-tender, bsx4 active  Extremities:  warm/dry, 1+ edema, heel protectors in place Skin: Grossly intact.    Resolved Hospital Problem list     Assessment & Plan:   ARDS secondary to COVID 19 infection. Intubated 09/09/2018 P Full vent support ABG follow Intermittent CXR Wean PEEP FiO2 as tolerated.  Some dyssynchrony this morning, which improved with increased I-time to 1.  High Fentanyl demands to maintain RASS -1. Will add duragesic.   CKD stage III: Creatinine slowly increasing - hold diuresis - Follow BMP  HTN Home norvasc P Continue to monitor blood pressure no need for antihypertensives at this time  DM2  P SSI  Sliding scale insulin protocol Lantus increased to 45 units on 09/15/2018 Tube feeding coverage added 3 units every 4 hours.   Atrial fib: now sinus.  - continue eliquis.  - Tele.   Hx Depression Holding Cymbalta  Disposition: Critically ill in ICU.  Best practice:  Diet: NPO, tube feeding tolerated Pain/Anxiety/Delirium protocol (if indicated):As needed VAP protocol (if indicated): Yes DVT prophylaxis: SQ Heparin  GI prophylaxis: protonix  Glucose control: Lantus, SSI, and tube feed coverage.  Mobility: bedrest Code Status: full Family Communication: Daughter dawn updated.  Disposition: ICU, note 09/15/2018 palliative care involved in care  Labs   CBC: Recent Labs  Lab 09/10/18 0437 09/11/18 0229 09/11/18 0401 09/12/18 0340 09/12/18 0355 09/16/18 0308 09/16/18 0405  WBC 16.0* 12.2*  --   --  8.9  --  11.9*  NEUTROABS  --  11.3*  --   --   --   --  10.4*  HGB 12.6 12.2 11.9* 10.9* 10.6* 10.2* 9.8*  HCT 40.4 38.0 35.0* 32.0* 34.0* 30.0* 33.0*  MCV 94.8 95.7  --   --  96.9  --  98.8  PLT 261 248  --   --  261  --  287    Basic Metabolic Panel: Recent Labs  Lab 09/12/18 0355 09/13/18 0516 09/14/18 0410 09/15/18 0406 09/16/18 0308 09/16/18 0405  NA 141 142 142 142 142 145  K 5.1 4.3 3.8 4.2 4.4 4.3  CL 109 104 104 101  --  104  CO2 24 27 30  32  --  31  GLUCOSE  262* 269* 308* 244*  --  204*  BUN 52* 70* 83* 101*  --  113*  CREATININE 1.41* 1.63* 1.82* 2.03*  --  2.10*  CALCIUM 8.7* 9.0 8.8* 9.0  --  9.2  MG 2.3 2.0 1.9 2.0  --  2.2  PHOS 2.0* 3.6 3.8 4.5  --  4.2   GFR: Estimated Creatinine Clearance: 21.7 mL/min (A) (by C-G formula based on SCr of 2.1 mg/dL (H)). Recent Labs  Lab 09/10/18 0437 09/11/18 0229 09/12/18 0355 09/16/18 0405  WBC 16.0* 12.2* 8.9 11.9*    Liver Function Tests: Recent Labs  Lab 09/10/18 0437  ALBUMIN 2.4*   No results for input(s): LIPASE, AMYLASE in the last 168 hours. No results for input(s): AMMONIA in the last 168 hours.  ABG    Component Value Date/Time   PHART 7.456 (H) 09/16/2018 0308   PCO2ART 50.2 (H) 09/16/2018 0308   PO2ART 77.0 (L) 09/16/2018 0308   HCO3 35.1 (H) 09/16/2018 0308   TCO2 37 (H) 09/16/2018 0308   ACIDBASEDEF 2.0 09/11/2018 0401   O2SAT 95.0 09/16/2018 0308     Coagulation Profile: No results for input(s): INR, PROTIME in the last 168 hours.  Cardiac Enzymes: Recent Labs  Lab 09/09/18 1608 09/09/18 2155  TROPONINI 0.10* 0.06*    HbA1C: Hgb A1c MFr Bld  Date/Time Value Ref Range Status  04/19/2012 03:43 PM 7.3 (H) <5.7 % Final    Comment:    (NOTE)                                                                       According to the ADA Clinical Practice Recommendations for 2011, when HbA1c is used as a screening test:  >=6.5%   Diagnostic of Diabetes Mellitus           (if abnormal result is confirmed) 5.7-6.4%   Increased risk of developing Diabetes Mellitus References:Diagnosis and Classification of Diabetes Mellitus,Diabetes OMVE,7209,47(SJGGE 1):S62-S69 and Standards of Medical Care in         Diabetes - 2011,Diabetes ZMOQ,9476,54 (Suppl 1):S11-S61.  04/18/2012 09:20 AM 7.3 (H) <5.7 % Final    Comment:    (NOTE)  According to the ADA Clinical Practice Recommendations for 2011,  when HbA1c is used as a screening test:  >=6.5%   Diagnostic of Diabetes Mellitus           (if abnormal result is confirmed) 5.7-6.4%   Increased risk of developing Diabetes Mellitus References:Diagnosis and Classification of Diabetes Mellitus,Diabetes ZOXW,9604,54(UJWJX 1):S62-S69 and Standards of Medical Care in         Diabetes - 2011,Diabetes BJYN,8295,62 (Suppl 1):S11-S61.    CBG: Recent Labs  Lab 09/15/18 1650 09/15/18 1946 09/15/18 2350 09/16/18 0349 09/16/18 0725  GLUCAP 273* 311* 236* 180* 240*      Critical care time: 45 minutes      Georgann Housekeeper, AGACNP-BC [place] Pager [phone] or [phone]  09/16/2018 2:08 PM   PCCM Attending:  78 y.o., female ARDS COVID 19 PNA. Stable overnight, attempted to wean this am. Failed SBT.   BP (!) 122/58    Pulse 89    Temp 99.4 F (37.4 C) (Oral)    Resp 20    Ht 5\' 3"  (1.6 m)    Wt 74.4 kg    SpO2 98%    BMI 29.06 kg/m   GEN: elderly, frail appearing fm, intubated sedated  Lungs: vent reviewed, BL vented breaths Cardiac: regular rate, sinus rhythm on tele  A:  ARDS, COVID19  AHRF on MV  CKDII HTN DMII Atrial fibrillation   P:  ARDS protocol LTVV Weaning slowly Drop Fio2 first, she seems stuck on 10 PEEP  Agree holding additional diuresis today  Continue eliquis   This patient is critically ill with multiple organ system failure; which, requires frequent high complexity decision making, assessment, support, evaluation, and titration of therapies. This was completed through the application of advanced monitoring technologies and extensive interpretation of multiple databases. During this encounter critical care time was devoted to patient care services described in this note for 34 minutes.   Garner Nash, DO [facility] 09/16/2018 4:35 PM  Personal pager: [phone] If unanswered, please page CCM On-call: [phone]

## 2018-09-16 NOTE — Progress Notes (Signed)
Inpatient Diabetes Program Recommendations  AACE/ADA: New Consensus Statement on Inpatient Glycemic Control (2015)  Target Ranges:  Prepandial:   less than 140 mg/dL      Peak postprandial:   less than 180 mg/dL (1-2 hours)      Critically ill patients:  140 - 180 mg/dL   Lab Results  Component Value Date   GLUCAP 240 (H) 09/16/2018   HGBA1C 7.3 (H) 04/19/2012    Review of Glycemic Control Results for Sabrina Baker, Sabrina Baker (MRN [id number]) as of 09/16/2018 12:12  Ref. Range 09/15/2018 23:50 09/16/2018 03:49 09/16/2018 07:25  Glucose-Capillary Latest Ref Range: 70 - 99 mg/dL 236 (H) 180 (H) 240 (H)    Current orders for Inpatient glycemic control:  Novolog moderate q 4 hours, Lantus 45 units daily (increased today) Vital 55 cc/hr Inpatient Diabetes Program Recommendations:    If appropriate, consider adding Novolog tube feed coverage 3 units q 4 hours.   Thanks,  Adah Perl, RN, BC-ADM Inpatient Diabetes Coordinator Pager [phone] (8a-5p)

## 2018-09-16 NOTE — Progress Notes (Signed)
eLink Physician-Brief Progress Note Patient Name: Sabrina Baker DOB: [birth year] MRN: [id number]   Date of Service  09/16/2018  HPI/Events of Note  Notified that pt has been hypertensive with SBP in the 180s.  Pt is on amlodipine at home.  VS on evaluation 169/72, 96, RR 23, 98% O2 sats.  eICU Interventions  Give labetalol prn.      Intervention Category Intermediate Interventions: Hypertension - evaluation and management  Elsie Lincoln 09/16/2018, 1:50 AM

## 2018-09-16 NOTE — Progress Notes (Signed)
NAME:  Sabrina Baker, MRN:  [id number], DOB:  06-21-1940, LOS: 7 ADMISSION DATE:  09/09/2018, CONSULTATION DATE:  09/09/18 REFERRING MD:  Regenia Skeeter - EM, CHIEF COMPLAINT:  SOB, diarrhea    Brief History   89 yoF presenting from home with c/o of cough, SOB, and abdominal pain x 2 weeks. Progressive hypoxia in ER now on NRB and CXR with bilateral infiltrates   Significant Hospital Events   3/30> admitted, intubated  Consults:  4/5 palliative care   Procedures:  3/30 ETT >>  09/10/2018 right PICC>  Significant Diagnostic Tests:  3/30 CXR >> Bilateral upper lobe airspace disease representing multifocal pneumonia versus pulmonary edema.  Micro Data:  3/30 BC x 2 > negative 3/30 RVP > neg 3/30 COVID-19 > positive 3/30 Trach asp >> normal flora 3/30 urine strep >> negative  3/30 urine legionella >> negative  Antimicrobials:  3/30 azithro >> 3/30 ceftriaxone >> completed 09/10/2018 Plaquenil >> completed  Interim history/subjective:  Hypertensive overnight treated with PRN labetalol.   Objective   Blood pressure (!) 183/87, pulse 93, temperature 99.4 F (37.4 C), temperature source Oral, resp. rate (!) 32, height 5\' 3"  (1.6 m), weight 74.4 kg, SpO2 94 %. CVP:  [1 mmHg-9 mmHg] 9 mmHg  Vent Mode: PRVC FiO2 (%):  [50 %] 50 % Set Rate:  [26 bmp] 26 bmp Vt Set:  [310 mL] 310 mL PEEP:  [8 cmH20] 8 cmH20 Plateau Pressure:  [15 cmH20-16 cmH20] 16 cmH20   Intake/Output Summary (Last 24 hours) at 09/16/2018 1018 Last data filed at 09/16/2018 0900 Gross per 24 hour  Intake 1006.99 ml  Output 1115 ml  Net -108.01 ml   Filed Weights   09/14/18 0500 09/15/18 0409 09/16/18 0401  Weight: 70.7 kg 72.8 kg 74.4 kg    Examination:  General: Elderly female on vent.  HEENT: ETT in place. PERRL. Rhythmic head movements/twitching left to right.  Neuro: Minimally responsive on sedation infuison.  CV: RRR, no MRG PULM: Clear TG:YBWL, non-tender, bsx4 active  Extremities: warm/dry, 1+  edema, heel protectors in place Skin: Grossly intact.    Resolved Hospital Problem list     Assessment & Plan:   ARDS secondary to COVID 19 infection. Intubated 09/09/2018 P Full vent support ABG follow Intermittent CXR Wean PEEP FiO2 as tolerated.  Some dyssynchrony this morning, which improved with increased I-time to 1.  High Fentanyl demands to maintain RASS -1. Will add duragesic.   CKD stage III: Creatinine slowly increasing - hold diuresis - Follow BMP  HTN Home norvasc P Continue to monitor blood pressure no need for antihypertensives at this time  DM2  P SSI  Sliding scale insulin protocol Lantus increased to 45 units on 09/15/2018 Tube feeding coverage added 3 units every 4 hours.   Hx Depression Holding Cymbalta  Disposition: Critically ill in ICU.  Best practice:  Diet: NPO, tube feeding tolerated Pain/Anxiety/Delirium protocol (if indicated):As needed VAP protocol (if indicated): Yes DVT prophylaxis: SQ Heparin  GI prophylaxis: protonix  Glucose control: Lantus, SSI, and tube feed coverage.  Mobility: bedrest Code Status: full Family Communication: Daughter dawn updated.  Disposition: ICU, note 09/15/2018 palliative care involved in care  Labs   CBC: Recent Labs  Lab 09/10/18 0437 09/11/18 0229 09/11/18 0401 09/12/18 0340 09/12/18 0355 09/16/18 0308 09/16/18 0405  WBC 16.0* 12.2*  --   --  8.9  --  11.9*  NEUTROABS  --  11.3*  --   --   --   --  10.4*  HGB 12.6 12.2 11.9* 10.9* 10.6* 10.2* 9.8*  HCT 40.4 38.0 35.0* 32.0* 34.0* 30.0* 33.0*  MCV 94.8 95.7  --   --  96.9  --  98.8  PLT 261 248  --   --  261  --  161    Basic Metabolic Panel: Recent Labs  Lab 09/12/18 0355 09/13/18 0516 09/14/18 0410 09/15/18 0406 09/16/18 0308 09/16/18 0405  NA 141 142 142 142 142 145  K 5.1 4.3 3.8 4.2 4.4 4.3  CL 109 104 104 101  --  104  CO2 24 27 30  32  --  31  GLUCOSE 262* 269* 308* 244*  --  204*  BUN 52* 70* 83* 101*  --  113*   CREATININE 1.41* 1.63* 1.82* 2.03*  --  2.10*  CALCIUM 8.7* 9.0 8.8* 9.0  --  9.2  MG 2.3 2.0 1.9 2.0  --  2.2  PHOS 2.0* 3.6 3.8 4.5  --  4.2   GFR: Estimated Creatinine Clearance: 21.7 mL/min (A) (by C-G formula based on SCr of 2.1 mg/dL (H)). Recent Labs  Lab 09/10/18 0437 09/11/18 0229 09/12/18 0355 09/16/18 0405  WBC 16.0* 12.2* 8.9 11.9*    Liver Function Tests: Recent Labs  Lab 09/10/18 0437  ALBUMIN 2.4*   No results for input(s): LIPASE, AMYLASE in the last 168 hours. No results for input(s): AMMONIA in the last 168 hours.  ABG    Component Value Date/Time   PHART 7.456 (H) 09/16/2018 0308   PCO2ART 50.2 (H) 09/16/2018 0308   PO2ART 77.0 (L) 09/16/2018 0308   HCO3 35.1 (H) 09/16/2018 0308   TCO2 37 (H) 09/16/2018 0308   ACIDBASEDEF 2.0 09/11/2018 0401   O2SAT 95.0 09/16/2018 0308     Coagulation Profile: No results for input(s): INR, PROTIME in the last 168 hours.  Cardiac Enzymes: Recent Labs  Lab 09/09/18 1608 09/09/18 2155  TROPONINI 0.10* 0.06*    HbA1C: Hgb A1c MFr Bld  Date/Time Value Ref Range Status  04/19/2012 03:43 PM 7.3 (H) <5.7 % Final    Comment:    (NOTE)                                                                       According to the ADA Clinical Practice Recommendations for 2011, when HbA1c is used as a screening test:  >=6.5%   Diagnostic of Diabetes Mellitus           (if abnormal result is confirmed) 5.7-6.4%   Increased risk of developing Diabetes Mellitus References:Diagnosis and Classification of Diabetes Mellitus,Diabetes WRUE,4540,98(JXBJY 1):S62-S69 and Standards of Medical Care in         Diabetes - 2011,Diabetes NWGN,5621,30 (Suppl 1):S11-S61.  04/18/2012 09:20 AM 7.3 (H) <5.7 % Final    Comment:    (NOTE)                                                                       According to the ADA Clinical  Practice Recommendations for 2011, when HbA1c is used as a screening test:  >=6.5%   Diagnostic of  Diabetes Mellitus           (if abnormal result is confirmed) 5.7-6.4%   Increased risk of developing Diabetes Mellitus References:Diagnosis and Classification of Diabetes Mellitus,Diabetes PJPE,1624,46(XFQHK 1):S62-S69 and Standards of Medical Care in         Diabetes - 2011,Diabetes UVJD,0518,33 (Suppl 1):S11-S61.    CBG: Recent Labs  Lab 09/15/18 1650 09/15/18 1946 09/15/18 2350 09/16/18 0349 09/16/18 0725  GLUCAP 273* 311* 236* 180* 240*      Critical care time: 45 minutes      Georgann Housekeeper, AGACNP-BC [place] Pager [phone] or [phone]  09/16/2018 10:25 AM

## 2018-09-16 NOTE — Progress Notes (Signed)
Patients daughter was updated via phone by Georgann Housekeeper NP.

## 2018-09-17 LAB — CBC
HCT: 30.6 % — ABNORMAL LOW (ref 36.0–46.0)
Hemoglobin: 9 g/dL — ABNORMAL LOW (ref 12.0–15.0)
MCH: 29.6 pg (ref 26.0–34.0)
MCHC: 29.4 g/dL — ABNORMAL LOW (ref 30.0–36.0)
MCV: 100.7 fL — ABNORMAL HIGH (ref 80.0–100.0)
Platelets: 293 10*3/uL (ref 150–400)
RBC: 3.04 MIL/uL — ABNORMAL LOW (ref 3.87–5.11)
RDW: 14.7 % (ref 11.5–15.5)
WBC: 10.8 10*3/uL — ABNORMAL HIGH (ref 4.0–10.5)
nRBC: 0 % (ref 0.0–0.2)

## 2018-09-17 LAB — CBC WITH DIFFERENTIAL/PLATELET
Abs Immature Granulocytes: 0.15 10*3/uL — ABNORMAL HIGH (ref 0.00–0.07)
Basophils Absolute: 0 10*3/uL (ref 0.0–0.1)
Basophils Relative: 0 %
Eosinophils Absolute: 0.1 10*3/uL (ref 0.0–0.5)
Eosinophils Relative: 1 %
HCT: 33.1 % — ABNORMAL LOW (ref 36.0–46.0)
Hemoglobin: 10.1 g/dL — ABNORMAL LOW (ref 12.0–15.0)
Immature Granulocytes: 1 %
Lymphocytes Relative: 5 %
Lymphs Abs: 0.6 10*3/uL — ABNORMAL LOW (ref 0.7–4.0)
MCH: 30.3 pg (ref 26.0–34.0)
MCHC: 30.5 g/dL (ref 30.0–36.0)
MCV: 99.4 fL (ref 80.0–100.0)
Monocytes Absolute: 0.7 10*3/uL (ref 0.1–1.0)
Monocytes Relative: 5 %
Neutro Abs: 11 10*3/uL — ABNORMAL HIGH (ref 1.7–7.7)
Neutrophils Relative %: 88 %
Platelets: 305 10*3/uL (ref 150–400)
RBC: 3.33 MIL/uL — ABNORMAL LOW (ref 3.87–5.11)
RDW: 14.6 % (ref 11.5–15.5)
WBC: 12.6 10*3/uL — ABNORMAL HIGH (ref 4.0–10.5)
nRBC: 0 % (ref 0.0–0.2)

## 2018-09-17 LAB — BASIC METABOLIC PANEL
Anion gap: 7 (ref 5–15)
BUN: 128 mg/dL — ABNORMAL HIGH (ref 8–23)
CO2: 35 mmol/L — ABNORMAL HIGH (ref 22–32)
Calcium: 9.8 mg/dL (ref 8.9–10.3)
Chloride: 105 mmol/L (ref 98–111)
Creatinine, Ser: 1.99 mg/dL — ABNORMAL HIGH (ref 0.44–1.00)
GFR calc Af Amer: 27 mL/min — ABNORMAL LOW (ref >60)
GFR calc non Af Amer: 24 mL/min — ABNORMAL LOW (ref >60)
Glucose, Bld: 250 mg/dL — ABNORMAL HIGH (ref 70–99)
Potassium: 4.3 mmol/L (ref 3.5–5.1)
Sodium: 147 mmol/L — ABNORMAL HIGH (ref 135–145)

## 2018-09-17 LAB — BLOOD GAS, ARTERIAL
Acid-Base Excess: 9.6 mmol/L — ABNORMAL HIGH (ref 0.0–2.0)
Bicarbonate: 35.1 mmol/L — ABNORMAL HIGH (ref 20.0–28.0)
Drawn by: 41875
FIO2: 50
MECHVT: 310 mL
O2 Saturation: 93.7 %
PEEP: 8 cmH2O
Patient temperature: 98.6
RATE: 22 resp/min
pCO2 arterial: 62.6 mmHg — ABNORMAL HIGH (ref 32.0–48.0)
pH, Arterial: 7.367 (ref 7.350–7.450)
pO2, Arterial: 73.8 mmHg — ABNORMAL LOW (ref 83.0–108.0)

## 2018-09-17 LAB — GLUCOSE, CAPILLARY
Glucose-Capillary: 212 mg/dL — ABNORMAL HIGH (ref 70–99)
Glucose-Capillary: 243 mg/dL — ABNORMAL HIGH (ref 70–99)
Glucose-Capillary: 201 mg/dL — ABNORMAL HIGH (ref 70–99)
Glucose-Capillary: 200 mg/dL — ABNORMAL HIGH (ref 70–99)
Glucose-Capillary: 176 mg/dL — ABNORMAL HIGH (ref 70–99)

## 2018-09-17 LAB — MAGNESIUM: Magnesium: 2.2 mg/dL (ref 1.7–2.4)

## 2018-09-17 MED ORDER — INSULIN ASPART 100 UNIT/ML ~~LOC~~ SOLN
2.0000 [IU] | SUBCUTANEOUS | Status: DC | PRN
  Administered 2018-09-17: 7 [IU] via SUBCUTANEOUS
  Filled 2018-09-17: qty 0.07

## 2018-09-17 MED ORDER — INSULIN ASPART 100 UNIT/ML ~~LOC~~ SOLN: 2.0000 [IU] | SUBCUTANEOUS | Status: DC

## 2018-09-17 MED ORDER — APIXABAN 5 MG PO TABS
5.0000 mg | ORAL_TABLET | Freq: Two times a day (BID) | ORAL | Status: DC
  Administered 2018-09-17 – 2018-09-30 (×24): 5 mg
  Filled 2018-09-17 (×30): qty 1

## 2018-09-17 MED ORDER — INSULIN ASPART 100 UNIT/ML ~~LOC~~ SOLN
2.0000 [IU] | SUBCUTANEOUS | Status: DC
  Administered 2018-09-17 (×3): 4 [IU] via SUBCUTANEOUS
  Administered 2018-09-18 (×3): 2 [IU] via SUBCUTANEOUS
  Administered 2018-09-18: 4 [IU] via SUBCUTANEOUS
  Administered 2018-09-19 (×2): 2 [IU] via SUBCUTANEOUS
  Administered 2018-09-19: 7 [IU] via SUBCUTANEOUS
  Administered 2018-09-19 – 2018-09-20 (×2): 4 [IU] via SUBCUTANEOUS
  Administered 2018-09-20: 2 [IU] via SUBCUTANEOUS
  Administered 2018-09-20: 4 [IU] via SUBCUTANEOUS
  Administered 2018-09-21 – 2018-09-22 (×3): 2 [IU] via SUBCUTANEOUS

## 2018-09-17 MED ORDER — INSULIN ASPART 100 UNIT/ML ~~LOC~~ SOLN
5.0000 [IU] | SUBCUTANEOUS | Status: DC
  Administered 2018-09-17 – 2018-09-20 (×14): 5 [IU] via SUBCUTANEOUS

## 2018-09-17 NOTE — Progress Notes (Signed)
eLink Physician-Brief Progress Note Patient Name: Sabrina Baker DOB: June 22, 1940 MRN: [id number]   Date of Service  09/17/2018  HPI/Events of Note  Request for AM lab orders.   eICU Interventions  Will order: 1. CBC with platelets in AM.  2. BMP in AM. 3. Mg++ level in AM.     Intervention Category Major Interventions: Other:  Lysle Dingwall 09/17/2018, 3:39 AM

## 2018-09-17 NOTE — Progress Notes (Signed)
Nutrition Follow-up  DOCUMENTATION CODES:   Not applicable  INTERVENTION:    Continue Vital AF 1.2 at 55 ml/h (1320 ml per day)  Provides 1584 kcal, 99 gm protein, 1071 ml free water daily.  NUTRITION DIAGNOSIS:   Inadequate oral intake related to inability to eat as evidenced by NPO status.  Ongoing  GOAL:   Provide needs based on ASPEN/SCCM guidelines  Met with TF  MONITOR:   Vent status, TF tolerance, I & O's, Labs, Skin  ASSESSMENT:   78 yo female with PMH of DM, HLD, HTN, CKD-3, neuromuscular disorder, B12 deficiency, osteopenia who was admitted with progressive SOB requiring intubation.  Patient continues to be treated for: COVID-19 positive Bilateral multifocal PNA ARDS Ongoing fever  Patient is currently intubated on ventilator support MV: 7.7 L/min Temp (24hrs), Avg:99.5 F (37.5 C), Min:98.6 F (37 C), Max:101 F (38.3 C)   Currently receiving Vital AF 1.2 at 55 ml/h via OGT, tolerating well, meeting 100% of nutrition needs.  Labs reviewed. Sodium 147 (H), BUN 128 (H), creatinine 1.99 (H) CBG's: [phone] Medications reviewed and include Colace, Novolog, Lantus.    NUTRITION - FOCUSED PHYSICAL EXAM:  unable to complete  Diet Order:   Diet Order            Diet NPO time specified  Diet effective now              EDUCATION NEEDS:   No education needs have been identified at this time  Skin:  Skin Assessment: Reviewed RN Assessment  Last BM:  4/4  Height:   Ht Readings from Last 1 Encounters:  09/09/18 '5\' 3"'  (1.6 m)    Weight:   Wt Readings from Last 1 Encounters:  09/17/18 74.7 kg    Ideal Body Weight:  52.3 kg  BMI:  Body mass index is 29.17 kg/m.  Estimated Nutritional Needs:   Kcal:  1580  Protein:  90-110 gm  Fluid:  1.6 L    Molli Barrows, RD, LDN, [place] Pager [phone] After Hours Pager [phone]

## 2018-09-17 NOTE — Progress Notes (Signed)
O2 stats noted to have decreased to 87%. Went in to assess pt. Pt slightly restless. 1 mg prn versed given and pt readjusted in bed. Oral care performed. FiO2 increased back to 50% pt O2 stats increased to 92%. Respiratory therapist notified.

## 2018-09-17 NOTE — Progress Notes (Signed)
NAME:  Sabrina Baker, MRN:  [id number], DOB:  1941-06-10, LOS: 8 ADMISSION DATE:  09/09/2018, CONSULTATION DATE:  09/09/18 REFERRING MD:  Regenia Skeeter - EM, CHIEF COMPLAINT:  SOB, diarrhea    Brief History   57 yoF presenting from home with c/o of cough, SOB, and abdominal pain x 2 weeks. Progressive hypoxia in ER now on NRB and CXR with bilateral infiltrates   Significant Hospital Events   3/30> admitted, intubated  Consults:  4/5 palliative care   Procedures:  3/30 ETT >>  09/10/2018 right PICC>  Significant Diagnostic Tests:  3/30 CXR >> Bilateral upper lobe airspace disease representing multifocal pneumonia versus pulmonary edema. 4/6 CXR> Bilateral multifocal PNA, personally reviewed   Micro Data:  3/30 BC x 2 > negative 3/30 RVP > neg 3/30 COVID-19 > positive 3/30 Trach asp >> normal flora 3/30 urine strep >> negative  3/30 urine legionella >> negative  Antimicrobials:  3/30 azithro >> completed  3/30 ceftriaxone >> completed 09/10/2018 Plaquenil >> completed  Interim history/subjective:  PEEP weaned to 5 and FiO2 40% Not on pressors   TMax 101 with marginal increase in WBC from 11.9 to 12.6 Cr. With mild improvement from 2.1 to 1.99   Objective   Blood pressure (!) 185/98, pulse (!) 109, temperature (!) 101 F (38.3 C), temperature source Rectal, resp. rate (!) 25, height 5\' 3"  (1.6 m), weight 74.7 kg, SpO2 96 %. CVP:  [9 mmHg-10 mmHg] 10 mmHg  Vent Mode: PRVC FiO2 (%):  [50 %] 50 % Set Rate:  [22 bmp-26 bmp] 22 bmp Vt Set:  [310 mL] 310 mL PEEP:  [8 cmH20] 8 cmH20 Plateau Pressure:  [14 cmH20] 14 cmH20   Intake/Output Summary (Last 24 hours) at 09/17/2018 0831 Last data filed at 09/17/2018 0600 Gross per 24 hour  Intake 1742.8 ml  Output 1525 ml  Net 217.8 ml   Filed Weights   09/15/18 0409 09/16/18 0401 09/17/18 0500  Weight: 72.8 kg 74.4 kg 74.7 kg    Examination: General: Elderly adult female intubated sedated on vent  HEENT: NCAT, ETT secure,  trachea midline, pink mmm anicteric sclera  Neuro: Does not awaken to stimuli. Moves BUE BLE spontaneously. PEERL CV: RRR s1s2 no r/g/m PULM: diminished bibasilar breath sounds, symmetrical chest expansion, no accessory muscle recruitment    GI: soft, round, ndnt, normoactive x4  Extremities: Symmetrical bulk and tone, no obvious deformity, 1+ deformity  Skin: Clean, dry, warm, intact without rash    Resolved Hospital Problem list     Assessment & Plan:   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  Intubated 09/09/2018 P Continue mechanical ventilatory support with LTVV  SpO2 goal >92%, titrate PEEP and FiO2 for goal  Weaned to 40% FiO2 and PEEP 5 this morning  AM CXR Continue pulm hygiene  Improved vent compliance with increase in I-time yesterday and addition of fentanyl patch If continued dyssynchrony today, would favor addition of precedex gtt   CKD III:  Some interval improvement in Cr from 2.1 to 1.99  P - Continue to hold diuretic, eval daily benefit/risk of diuresis. Today as we are able to wean vent some and patient is approximately net even, am ok holding diuretics - Continue to trend BMP -Strict I/O   HTN  Home norvasc P PRN labetalol for goal SBP < 160  DM2  Past 24 hours, blood glucose persistently >160  P SSI increased to resistant scale on 4/7 Lantus increased to 45 units on 09/15/2018 EN coverage:  novolog 3u q4hrs   History of Atrial Fibrillation, currently sinus rhythm  - continue eliquis.  -Continue telemetry   Hx Depression Holding cymbalta  Disposition: Critically ill in ICU.  Fever  TMax 101 overnight, WBC 12.6 PICC placed 3/31  P If continues to be febrile would pan culture   Best practice:  Diet: EN  Pain/Anxiety/Delirium protocol (if indicated): Fentanyl gtt + duragesic patch + PRN fent + PRN versed  VAP protocol (if indicated): Yes DVT prophylaxis: SQ Heparin  GI prophylaxis: protonix  Glucose control: Lantus, SSI, and tube feed  coverage.  Mobility: bedrest Code Status: full Family Communication: Daughter dawn updated.  Disposition: ICU   Labs   CBC: Recent Labs  Lab 09/11/18 0229  09/12/18 0340 09/12/18 0355 09/16/18 0308 09/16/18 0405 09/17/18 0423  WBC 12.2*  --   --  8.9  --  11.9* 12.6*  NEUTROABS 11.3*  --   --   --   --  10.4* 11.0*  HGB 12.2   < > 10.9* 10.6* 10.2* 9.8* 10.1*  HCT 38.0   < > 32.0* 34.0* 30.0* 33.0* 33.1*  MCV 95.7  --   --  96.9  --  98.8 99.4  PLT 248  --   --  261  --  312 305   < > = values in this interval not displayed.    Basic Metabolic Panel: Recent Labs  Lab 09/12/18 0355 09/13/18 0516 09/14/18 0410 09/15/18 0406 09/16/18 0308 09/16/18 0405 09/17/18 0423  NA 141 142 142 142 142 145 147*  K 5.1 4.3 3.8 4.2 4.4 4.3 4.3  CL 109 104 104 101  --  104 105  CO2 24 27 30  32  --  31 35*  GLUCOSE 262* 269* 308* 244*  --  204* 250*  BUN 52* 70* 83* 101*  --  113* 128*  CREATININE 1.41* 1.63* 1.82* 2.03*  --  2.10* 1.99*  CALCIUM 8.7* 9.0 8.8* 9.0  --  9.2 9.8  MG 2.3 2.0 1.9 2.0  --  2.2 2.2  PHOS 2.0* 3.6 3.8 4.5  --  4.2  --    GFR: Estimated Creatinine Clearance: 22.9 mL/min (A) (by C-G formula based on SCr of 1.99 mg/dL (H)). Recent Labs  Lab 09/11/18 0229 09/12/18 0355 09/16/18 0405 09/17/18 0423  WBC 12.2* 8.9 11.9* 12.6*    Liver Function Tests: Recent Labs  Lab 09/16/18 1610  AST 16  ALT 9  ALKPHOS 38  BILITOT 0.4  PROT 5.1*  ALBUMIN 1.3*   No results for input(s): LIPASE, AMYLASE in the last 168 hours. No results for input(s): AMMONIA in the last 168 hours.  ABG    Component Value Date/Time   PHART 7.367 09/17/2018 0235   PCO2ART 62.6 (H) 09/17/2018 0235   PO2ART 73.8 (L) 09/17/2018 0235   HCO3 35.1 (H) 09/17/2018 0235   TCO2 37 (H) 09/16/2018 0308   ACIDBASEDEF 2.0 09/11/2018 0401   O2SAT 93.7 09/17/2018 0235     Coagulation Profile: No results for input(s): INR, PROTIME in the last 168 hours.  Cardiac Enzymes: No results  for input(s): CKTOTAL, CKMB, CKMBINDEX, TROPONINI in the last 168 hours.  HbA1C: Hgb A1c MFr Bld  Date/Time Value Ref Range Status  04/19/2012 03:43 PM 7.3 (H) <5.7 % Final    Comment:    (NOTE)  According to the ADA Clinical Practice Recommendations for 2011, when HbA1c is used as a screening test:  >=6.5%   Diagnostic of Diabetes Mellitus           (if abnormal result is confirmed) 5.7-6.4%   Increased risk of developing Diabetes Mellitus References:Diagnosis and Classification of Diabetes Mellitus,Diabetes Care,2011,34(Suppl 1):S62-S69 and Standards of Medical Care in         Diabetes - 2011,Diabetes VOJJ,0093,81 (Suppl 1):S11-S61.  04/18/2012 09:20 AM 7.3 (H) <5.7 % Final    Comment:    (NOTE)                                                                       According to the ADA Clinical Practice Recommendations for 2011, when HbA1c is used as a screening test:  >=6.5%   Diagnostic of Diabetes Mellitus           (if abnormal result is confirmed) 5.7-6.4%   Increased risk of developing Diabetes Mellitus References:Diagnosis and Classification of Diabetes Mellitus,Diabetes WEXH,3716,96(VELFY 1):S62-S69 and Standards of Medical Care in         Diabetes - 2011,Diabetes Care,2011,34 (Suppl 1):S11-S61.    CBG: Recent Labs  Lab 09/16/18 1556 09/16/18 2000 09/16/18 2329 09/17/18 0314 09/17/18 0800  GLUCAP 286* 232* 247* 212* 243*      Critical care time: 40 min      Eliseo Gum MSN, AGACNP-BC [place] [phone] If no answer, [phone] 09/17/2018, 8:31 AM

## 2018-09-18 ENCOUNTER — Inpatient Hospital Stay (HOSPITAL_COMMUNITY): Payer: Medicare Other

## 2018-09-18 DIAGNOSIS — J988 Other specified respiratory disorders: Secondary | ICD-10-CM

## 2018-09-18 LAB — GLUCOSE, CAPILLARY
Glucose-Capillary: 132 mg/dL — ABNORMAL HIGH (ref 70–99)
Glucose-Capillary: 135 mg/dL — ABNORMAL HIGH (ref 70–99)
Glucose-Capillary: 150 mg/dL — ABNORMAL HIGH (ref 70–99)
Glucose-Capillary: 182 mg/dL — ABNORMAL HIGH (ref 70–99)
Glucose-Capillary: 192 mg/dL — ABNORMAL HIGH (ref 70–99)
Glucose-Capillary: 99 mg/dL (ref 70–99)

## 2018-09-18 LAB — BASIC METABOLIC PANEL
Anion gap: 8 (ref 5–15)
BUN: 129 mg/dL — ABNORMAL HIGH (ref 8–23)
CO2: 34 mmol/L — ABNORMAL HIGH (ref 22–32)
Calcium: 10.6 mg/dL — ABNORMAL HIGH (ref 8.9–10.3)
Chloride: 106 mmol/L (ref 98–111)
Creatinine, Ser: 1.76 mg/dL — ABNORMAL HIGH (ref 0.44–1.00)
GFR calc Af Amer: 32 mL/min — ABNORMAL LOW (ref >60)
GFR calc non Af Amer: 27 mL/min — ABNORMAL LOW (ref >60)
Glucose, Bld: 153 mg/dL — ABNORMAL HIGH (ref 70–99)
Potassium: 4.5 mmol/L (ref 3.5–5.1)
Sodium: 148 mmol/L — ABNORMAL HIGH (ref 135–145)

## 2018-09-18 MED ORDER — FREE WATER
300.0000 mL | Freq: Three times a day (TID) | Status: DC
  Administered 2018-09-18 – 2018-09-30 (×34): 300 mL

## 2018-09-18 MED ORDER — PROPOFOL 1000 MG/100ML IV EMUL
INTRAVENOUS | Status: AC
  Filled 2018-09-18: qty 100

## 2018-09-18 MED ORDER — VALPROIC ACID 250 MG/5ML PO SOLN
250.0000 mg | Freq: Two times a day (BID) | ORAL | Status: DC
  Administered 2018-09-18 – 2018-09-24 (×14): 250 mg
  Filled 2018-09-18 (×17): qty 5

## 2018-09-18 MED ORDER — FREE WATER
200.0000 mL | Freq: Three times a day (TID) | Status: DC
  Administered 2018-09-18: 200 mL

## 2018-09-18 MED ORDER — SODIUM CHLORIDE 0.9 % IV SOLN
INTRAVENOUS | Status: DC
  Administered 2018-09-18 – 2018-10-01 (×6): via INTRAVENOUS

## 2018-09-18 MED ORDER — CLONIDINE HCL 0.1 MG PO TABS
0.1000 mg | ORAL_TABLET | Freq: Two times a day (BID) | ORAL | Status: DC
  Administered 2018-09-18: 0.1 mg via ORAL
  Filled 2018-09-18 (×2): qty 1

## 2018-09-18 MED ORDER — ALTEPLASE 2 MG IJ SOLR
2.0000 mg | Freq: Once | INTRAMUSCULAR | Status: AC
  Administered 2018-09-18: 2 mg
  Filled 2018-09-18: qty 2

## 2018-09-18 MED ORDER — MIDAZOLAM HCL 2 MG/2ML IJ SOLN
2.0000 mg | Freq: Once | INTRAMUSCULAR | Status: AC
  Administered 2018-09-18: 2 mg via INTRAVENOUS
  Filled 2018-09-18: qty 2

## 2018-09-18 MED ORDER — DEXTROSE 5 % IV SOLN
INTRAVENOUS | Status: DC
  Administered 2018-09-18: 1 mL via INTRAVENOUS

## 2018-09-18 MED ORDER — CLONIDINE ORAL SUSPENSION 10 MCG/ML
0.2000 mg | Freq: Once | ORAL | Status: DC
  Filled 2018-09-18: qty 20

## 2018-09-18 MED ORDER — INSULIN GLARGINE 100 UNIT/ML ~~LOC~~ SOLN
20.0000 [IU] | SUBCUTANEOUS | Status: DC
  Filled 2018-09-18: qty 0.2

## 2018-09-18 MED ORDER — CLONIDINE HCL 0.2 MG PO TABS
0.2000 mg | ORAL_TABLET | Freq: Two times a day (BID) | ORAL | Status: DC
  Administered 2018-09-18 – 2018-09-20 (×4): 0.2 mg
  Filled 2018-09-18 (×4): qty 1

## 2018-09-18 MED ORDER — ACETAMINOPHEN 160 MG/5ML PO SOLN
650.0000 mg | ORAL | Status: DC | PRN
  Administered 2018-09-18 – 2018-09-25 (×5): 650 mg
  Filled 2018-09-18 (×5): qty 20.3

## 2018-09-18 MED ORDER — INSULIN GLARGINE 100 UNIT/ML ~~LOC~~ SOLN
45.0000 [IU] | SUBCUTANEOUS | Status: DC
  Administered 2018-09-19: 45 [IU] via SUBCUTANEOUS
  Filled 2018-09-18 (×2): qty 0.45

## 2018-09-18 MED ORDER — CLONIDINE HCL 0.2 MG PO TABS
0.2000 mg | ORAL_TABLET | Freq: Once | ORAL | Status: AC
  Administered 2018-09-18: 0.2 mg
  Filled 2018-09-18: qty 1

## 2018-09-18 MED ORDER — CLONIDINE ORAL SUSPENSION 10 MCG/ML: 0.1000 mg | Freq: Two times a day (BID) | ORAL | Status: DC

## 2018-09-18 MED ORDER — PROPOFOL 1000 MG/100ML IV EMUL
5.0000 ug/kg/min | INTRAVENOUS | Status: DC
  Administered 2018-09-18: 15 ug/kg/min via INTRAVENOUS
  Administered 2018-09-19: 16 ug/kg/min via INTRAVENOUS
  Filled 2018-09-18: qty 100

## 2018-09-18 NOTE — Progress Notes (Signed)
NAME:  ARIAH MOWER, MRN:  [id number], DOB:  08/24/1940, LOS: 9 ADMISSION DATE:  09/09/2018, CONSULTATION DATE:  09/09/18 REFERRING MD:  Regenia Skeeter - EM, CHIEF COMPLAINT:  SOB, diarrhea    Brief History   78 yo F presenting from home with c/o of cough, SOB, and abdominal pain x 2 weeks. Progressive hypoxia in ER now on NRB and CXR with bilateral infiltrates. 4/8 SBT to extubate, failed.   Significant Hospital Events   3/30> admitted, intubated 4/8 > weaned for 4 hours failed SBT due to agitation  Consults:  4/5 palliative care   Procedures:  3/30 ETT >>  09/10/2018 right PICC>  Significant Diagnostic Tests:  3/30 CXR >> Bilateral upper lobe airspace disease representing multifocal pneumonia versus pulmonary edema. 4/6 CXR> Bilateral multifocal PNA  Micro Data:  3/30 BC x 2 > negative 3/30 RVP > neg 3/30 COVID-19 > positive 3/30 Trach asp >> normal flora 3/30 urine strep >> negative  3/30 urine legionella >> negative  Antimicrobials:  3/30 azithro >> completed  3/30 ceftriaxone >> completed 09/10/2018 Plaquenil >> completed  Interim history/subjective:  4/8 Cxry >>> improved aeration on RML, radiologist reads as improved RLL 4/8 WBC 10.8, improved from 4/7, Cr continues to improve - 1.76 SBT >>> failed, weaned for 4 hours  Objective   Blood pressure 125/62, pulse 85, temperature 98.5 F (36.9 C), temperature source Rectal, resp. rate (!) 21, height 5\' 3"  (1.6 m), weight 73.3 kg, SpO2 100 %. CVP:  [10 mmHg] 10 mmHg  Vent Mode: CPAP;PSV FiO2 (%):  [40 %-55 %] 40 % Set Rate:  [22 bmp] 22 bmp Vt Set:  [310 mL] 310 mL PEEP:  [5 cmH20-8 cmH20] 5 cmH20 Pressure Support:  [5 cmH20] 5 cmH20 Plateau Pressure:  [7 cmH20] 7 cmH20   Intake/Output Summary (Last 24 hours) at 09/18/2018 0815 Last data filed at 09/18/2018 0700 Gross per 24 hour  Intake 2017.52 ml  Output 1975 ml  Net 42.52 ml   Filed Weights   09/16/18 0401 09/17/18 0500 09/18/18 0500  Weight: 74.4 kg 74.7 kg  73.3 kg    Examination:  Physical Exam  BP (!) 198/76   Pulse 95   Temp 98.5 F (36.9 C) (Rectal)   Resp (!) 25   Ht 5\' 3"  (1.6 m)   Wt 73.3 kg   SpO2 98%   BMI 28.63 kg/m   GEN: elderly, adult female, appears stated age, agitated on vent HEENT:  Left pupil pin point slightly responsive, Right pupil irregular and non responsive, ETT and OG in place, lips and oral mucosa dry RESP  Diminished breath sounds throughout exam, no audible wheezing, symmetrical chest wall movement CARD:  RRR, s1s2, ST, trace le peripheral edema bilaterally, pulses intact: radial 2+ bilaterally, DP 2+ bilaterally  GI:   Soft & nt; nml bowel sounds; no organomegaly or masses detected.  Musco: Warm bilaterally, bilateral wrist restraints, moves extremities bilaterally  .  Neuro: agitated, not following commands  Skin: Warm, no lesions or rashes  Resolved Hospital Problem list     Assessment & Plan:   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  CXRY today shows improved aeration in RML / RLL  P 4/8 SBT with goal to extubate, did not tolerate due to agitation PRVC  Start Valproic Acid 250mg  BID Start Clonidine 0.1mg  BID  Sp02 goal of > 92% CXRY 4/9  Agitation  Required high amounts of sedation night of 4/7 Expired 4/6 Fentanyl patch right upper arm P:  Stop fentanyl gtt  Stop prn Versed Fentanyl pushes prn Start Valproic Acid 250mg  BID  Start Clonidine 0.1mg  BID  Consider Propofol infusion if sedation needs increase   CKD III:  Some interval improvement in Cr - 1.76 2L positive since admission P Continue to hold diuretic  Trend BMP  Strict I/O  Avoid nephrotoxic drugs   HTN  Home norvasc  Home Coreg 25mg  BID Continues to be hypertensive  P Continue norvasc  Added Clonidine 0.1mg  BID  PRN labetalol for goal SBP < 160  DM2  AM glucose 153 Lantus 25 units BID at home  P SSI resistant scale  Monitor cbgs q4  Continue lantus 45units   History of Atrial Fibrillation  Currently ST  P:  Continue eliquis  Continue telemetry  Prn labetelol   Hx Depression Holding home cymbalta P:  Start Valproic acid 250mg  BID  Consider home gabapentin start on 4/9  Disposition: Critically ill in ICU.  Fever  Intermittent fever persists Improved 4/8 WBC 10.8 3/31 PICC  P:  Continue to monitor clinically    Best practice:  Diet: when extubated NPO till bedside nursing swallow eval per protocol  Pain/Anxiety/Delirium protocol (if indicated): Fentanyl gtt + duragesic patch + PRN fent + PRN versed  VAP protocol (if indicated): Yes DVT prophylaxis: SQ Heparin  GI prophylaxis: protonix  Glucose control: Lantus, SSI  Mobility: bedrest Code Status: full Family Communication: Daughter dawn updated.  Disposition: ICU   Labs   CBC: Recent Labs  Lab 09/12/18 0355 09/16/18 0308 09/16/18 0405 09/17/18 0423 09/17/18 1048  WBC 8.9  --  11.9* 12.6* 10.8*  NEUTROABS  --   --  10.4* 11.0*  --   HGB 10.6* 10.2* 9.8* 10.1* 9.0*  HCT 34.0* 30.0* 33.0* 33.1* 30.6*  MCV 96.9  --  98.8 99.4 100.7*  PLT 261  --  312 305 751    Basic Metabolic Panel: Recent Labs  Lab 09/12/18 0355 09/13/18 0516 09/14/18 0410 09/15/18 0406 09/16/18 0308 09/16/18 0405 09/17/18 0423 09/18/18 0423  NA 141 142 142 142 142 145 147* 148*  K 5.1 4.3 3.8 4.2 4.4 4.3 4.3 4.5  CL 109 104 104 101  --  104 105 106  CO2 24 27 30  32  --  31 35* 34*  GLUCOSE 262* 269* 308* 244*  --  204* 250* 153*  BUN 52* 70* 83* 101*  --  113* 128* 129*  CREATININE 1.41* 1.63* 1.82* 2.03*  --  2.10* 1.99* 1.76*  CALCIUM 8.7* 9.0 8.8* 9.0  --  9.2 9.8 10.6*  MG 2.3 2.0 1.9 2.0  --  2.2 2.2  --   PHOS 2.0* 3.6 3.8 4.5  --  4.2  --   --    GFR: Estimated Creatinine Clearance: 25.7 mL/min (A) (by C-G formula based on SCr of 1.76 mg/dL (H)). Recent Labs  Lab 09/12/18 0355 09/16/18 0405 09/17/18 0423 09/17/18 1048  WBC 8.9 11.9* 12.6* 10.8*    Liver Function Tests: Recent Labs  Lab 09/16/18  1610  AST 16  ALT 9  ALKPHOS 38  BILITOT 0.4  PROT 5.1*  ALBUMIN 1.3*   No results for input(s): LIPASE, AMYLASE in the last 168 hours. No results for input(s): AMMONIA in the last 168 hours.  ABG    Component Value Date/Time   PHART 7.367 09/17/2018 0235   PCO2ART 62.6 (H) 09/17/2018 0235   PO2ART 73.8 (L) 09/17/2018 0235   HCO3 35.1 (H) 09/17/2018 0235   TCO2 37 (  H) 09/16/2018 0308   ACIDBASEDEF 2.0 09/11/2018 0401   O2SAT 93.7 09/17/2018 0235     Coagulation Profile: No results for input(s): INR, PROTIME in the last 168 hours.  Cardiac Enzymes: No results for input(s): CKTOTAL, CKMB, CKMBINDEX, TROPONINI in the last 168 hours.  HbA1C: Hgb A1c MFr Bld  Date/Time Value Ref Range Status  04/19/2012 03:43 PM 7.3 (H) <5.7 % Final    Comment:    (NOTE)                                                                       According to the ADA Clinical Practice Recommendations for 2011, when HbA1c is used as a screening test:  >=6.5%   Diagnostic of Diabetes Mellitus           (if abnormal result is confirmed) 5.7-6.4%   Increased risk of developing Diabetes Mellitus References:Diagnosis and Classification of Diabetes Mellitus,Diabetes Care,2011,34(Suppl 1):S62-S69 and Standards of Medical Care in         Diabetes - 2011,Diabetes TDHR,4163,84 (Suppl 1):S11-S61.  04/18/2012 09:20 AM 7.3 (H) <5.7 % Final    Comment:    (NOTE)                                                                       According to the ADA Clinical Practice Recommendations for 2011, when HbA1c is used as a screening test:  >=6.5%   Diagnostic of Diabetes Mellitus           (if abnormal result is confirmed) 5.7-6.4%   Increased risk of developing Diabetes Mellitus References:Diagnosis and Classification of Diabetes Mellitus,Diabetes TXMI,6803,21(YYQMG 1):S62-S69 and Standards of Medical Care in         Diabetes - 2011,Diabetes Care,2011,34 (Suppl 1):S11-S61.    CBG: Recent Labs  Lab  09/17/18 0800 09/17/18 1033 09/17/18 1554 09/17/18 2017 09/17/18 2337  GLUCAP 243* 201* 200* 176* 192*      Critical care time: 40 min      Lauraine Rinne, NP PCCM Critical Care

## 2018-09-18 NOTE — Progress Notes (Signed)
Patient sedation discontinued during day shift to facilitate weaning from ventilator. Patient unable to wean and still without continuous sedation. Patient restless in the bed and dyssynchonus with ventilator, despite attempts to give PRN sedation ( 50 fentanyl every 2 hours.) Call placed to [facility] to request sedation. Per Day shift MD note- requested Propofol. Patient much improved. Will continue to monitor, with plans to wean sedation prior to shift change to allow continued weaning attempts.   Milford Cage, RN

## 2018-09-18 NOTE — Progress Notes (Signed)
eLink Physician-Brief Progress Note Patient Name: Sabrina Baker DOB: 07-19-1940 MRN: [id number]   Date of Service  09/18/2018  HPI/Events of Note  Attempts being made for SAT/SBT. Patient continues to be agitated given Fentanyl pushes with desynchrony on vent  eICU Interventions  Start Propofol which is short acting. Turn off in AM to facilitate liberation from vent     Intervention Category Minor Interventions: Agitation / anxiety - evaluation and management  Judd Lien 09/18/2018, 11:04 PM

## 2018-09-19 LAB — CBC WITH DIFFERENTIAL/PLATELET
Abs Immature Granulocytes: 0.16 10*3/uL — ABNORMAL HIGH (ref 0.00–0.07)
Basophils Absolute: 0 10*3/uL (ref 0.0–0.1)
Basophils Relative: 0 %
Eosinophils Absolute: 0.2 10*3/uL (ref 0.0–0.5)
Eosinophils Relative: 1 %
HCT: 30.5 % — ABNORMAL LOW (ref 36.0–46.0)
Hemoglobin: 9 g/dL — ABNORMAL LOW (ref 12.0–15.0)
Immature Granulocytes: 1 %
Lymphocytes Relative: 7 %
Lymphs Abs: 1.2 10*3/uL (ref 0.7–4.0)
MCH: 29.7 pg (ref 26.0–34.0)
MCHC: 29.5 g/dL — ABNORMAL LOW (ref 30.0–36.0)
MCV: 100.7 fL — ABNORMAL HIGH (ref 80.0–100.0)
Monocytes Absolute: 1.2 10*3/uL — ABNORMAL HIGH (ref 0.1–1.0)
Monocytes Relative: 7 %
Neutro Abs: 14.7 10*3/uL — ABNORMAL HIGH (ref 1.7–7.7)
Neutrophils Relative %: 84 %
Platelets: 295 10*3/uL (ref 150–400)
RBC: 3.03 MIL/uL — ABNORMAL LOW (ref 3.87–5.11)
RDW: 14.5 % (ref 11.5–15.5)
WBC: 17.4 10*3/uL — ABNORMAL HIGH (ref 4.0–10.5)
nRBC: 0 % (ref 0.0–0.2)

## 2018-09-19 LAB — GLUCOSE, CAPILLARY
Glucose-Capillary: 103 mg/dL — ABNORMAL HIGH (ref 70–99)
Glucose-Capillary: 112 mg/dL — ABNORMAL HIGH (ref 70–99)
Glucose-Capillary: 125 mg/dL — ABNORMAL HIGH (ref 70–99)
Glucose-Capillary: 135 mg/dL — ABNORMAL HIGH (ref 70–99)
Glucose-Capillary: 143 mg/dL — ABNORMAL HIGH (ref 70–99)
Glucose-Capillary: 162 mg/dL — ABNORMAL HIGH (ref 70–99)
Glucose-Capillary: 214 mg/dL — ABNORMAL HIGH (ref 70–99)
Glucose-Capillary: 69 mg/dL — ABNORMAL LOW (ref 70–99)

## 2018-09-19 LAB — COMPREHENSIVE METABOLIC PANEL
ALT: 13 U/L (ref 0–44)
AST: 22 U/L (ref 15–41)
Albumin: 1.6 g/dL — ABNORMAL LOW (ref 3.5–5.0)
Alkaline Phosphatase: 36 U/L — ABNORMAL LOW (ref 38–126)
Anion gap: 10 (ref 5–15)
BUN: 127 mg/dL — ABNORMAL HIGH (ref 8–23)
CO2: 34 mmol/L — ABNORMAL HIGH (ref 22–32)
Calcium: 10.2 mg/dL (ref 8.9–10.3)
Chloride: 104 mmol/L (ref 98–111)
Creatinine, Ser: 1.98 mg/dL — ABNORMAL HIGH (ref 0.44–1.00)
GFR calc Af Amer: 28 mL/min — ABNORMAL LOW (ref >60)
GFR calc non Af Amer: 24 mL/min — ABNORMAL LOW (ref >60)
Glucose, Bld: 216 mg/dL — ABNORMAL HIGH (ref 70–99)
Potassium: 4.2 mmol/L (ref 3.5–5.1)
Sodium: 148 mmol/L — ABNORMAL HIGH (ref 135–145)
Total Bilirubin: 0.4 mg/dL (ref 0.3–1.2)
Total Protein: 5.5 g/dL — ABNORMAL LOW (ref 6.5–8.1)

## 2018-09-19 LAB — PROCALCITONIN: Procalcitonin: 0.35 ng/mL

## 2018-09-19 LAB — FERRITIN: Ferritin: 602 ng/mL — ABNORMAL HIGH (ref 11–307)

## 2018-09-19 LAB — C-REACTIVE PROTEIN: CRP: 2.4 mg/dL — ABNORMAL HIGH (ref <1.0)

## 2018-09-19 LAB — LACTATE DEHYDROGENASE: LDH: 174 U/L (ref 98–192)

## 2018-09-19 LAB — TRIGLYCERIDES: Triglycerides: 75 mg/dL (ref <150)

## 2018-09-19 MED ORDER — CLONAZEPAM 1 MG PO TABS
1.0000 mg | ORAL_TABLET | Freq: Two times a day (BID) | ORAL | Status: DC
  Administered 2018-09-19: 1 mg
  Filled 2018-09-19: qty 1

## 2018-09-19 MED ORDER — MIDAZOLAM HCL 2 MG/2ML IJ SOLN
1.0000 mg | INTRAMUSCULAR | Status: DC | PRN
  Administered 2018-09-19 – 2018-09-25 (×13): 1 mg via INTRAVENOUS
  Filled 2018-09-19 (×15): qty 2

## 2018-09-19 MED ORDER — DEXTROSE 50 % IV SOLN
INTRAVENOUS | Status: AC
  Administered 2018-09-19: 50 mL
  Filled 2018-09-19: qty 50

## 2018-09-19 MED ORDER — CLONAZEPAM 0.1 MG/ML ORAL SUSPENSION
1.0000 mg | Freq: Two times a day (BID) | ORAL | Status: DC
  Filled 2018-09-19: qty 10

## 2018-09-19 MED ORDER — INSULIN GLARGINE 100 UNIT/ML ~~LOC~~ SOLN
48.0000 [IU] | SUBCUTANEOUS | Status: DC
  Filled 2018-09-19 (×2): qty 0.48

## 2018-09-19 MED ORDER — PROPOFOL 1000 MG/100ML IV EMUL
5.0000 ug/kg/min | INTRAVENOUS | Status: DC
  Administered 2018-09-19: 19:00:00 20 ug/kg/min via INTRAVENOUS
  Administered 2018-09-20: 13.5 ug/kg/min via INTRAVENOUS
  Filled 2018-09-19 (×2): qty 100

## 2018-09-19 MED ORDER — GABAPENTIN 250 MG/5ML PO SOLN: 100.0000 mg | Freq: Three times a day (TID) | ORAL | Status: DC

## 2018-09-19 MED ORDER — CLONAZEPAM 0.5 MG PO TBDP
1.0000 mg | ORAL_TABLET | Freq: Two times a day (BID) | ORAL | Status: DC
  Administered 2018-09-19 – 2018-09-23 (×7): 1 mg via ORAL
  Filled 2018-09-19 (×7): qty 2

## 2018-09-19 MED ORDER — GABAPENTIN 250 MG/5ML PO SOLN
100.0000 mg | Freq: Every day | ORAL | Status: DC
  Administered 2018-09-19 – 2018-09-29 (×11): 100 mg
  Filled 2018-09-19 (×13): qty 2

## 2018-09-19 MED ORDER — LACTATED RINGERS IV BOLUS
500.0000 mL | Freq: Once | INTRAVENOUS | Status: AC
  Administered 2018-09-19: 500 mL via INTRAVENOUS

## 2018-09-19 MED ORDER — PROPOFOL 1000 MG/100ML IV EMUL
INTRAVENOUS | Status: AC
  Administered 2018-09-19: 20 ug/kg/min via INTRAVENOUS
  Filled 2018-09-19: qty 100

## 2018-09-19 NOTE — Progress Notes (Signed)
NAME:  Sabrina Baker, MRN:  [id number], DOB:  [birth year], LOS: 61 ADMISSION DATE:  09/09/2018, CONSULTATION DATE:  09/09/18 REFERRING MD:  Regenia Skeeter - EM, CHIEF COMPLAINT:  SOB, diarrhea    Brief History   78 yo F presenting from home with c/o of cough, SOB, and abdominal pain x 2 weeks. Progressive hypoxia in ER now on NRB and CXR with bilateral infiltrates. 4/8 SBT to extubate, failed.   Significant Hospital Events   3/30> admitted, intubated 4/8 > weaned for 4 hours failed SBT due to agitation  Consults:  4/5 palliative care   Procedures:  3/30 ETT >>  09/10/2018 right PICC>  Significant Diagnostic Tests:  3/30 CXR >> Bilateral upper lobe airspace disease representing multifocal pneumonia versus pulmonary edema. 4/6 CXR> Bilateral multifocal PNA  Micro Data:  3/30 BC x 2 > negative 3/30 RVP > neg 3/30 COVID-19 > positive 3/30 Trach asp >> normal flora 3/30 urine strep >> negative  3/30 urine legionella >> negative  Antimicrobials:  3/30 azithro >> completed  3/30 ceftriaxone >> completed 09/10/2018 Plaquenil >> completed  Interim history/subjective:  4/8 Cxry >>> improved aeration on RML, radiologist reads as improved RLL 4/8 WBC 10.8, improved from 4/7, Cr continues to improve - 1.76 SBT >>> failed, weaned for 4 hours 49 2020 severe agitation precludes weaning  Objective   Blood pressure (!) 97/52, pulse 71, temperature 98.3 F (36.8 C), temperature source Rectal, resp. rate 18, height 5\' 3"  (1.6 m), weight 76 kg, SpO2 94 %.    Vent Mode: PRVC FiO2 (%):  [40 %-60 %] 50 % Set Rate:  [22 bmp] 22 bmp Vt Set:  [310 mL] 310 mL PEEP:  [5 cmH20] 5 cmH20 Plateau Pressure:  [7 cmH20-14 cmH20] 14 cmH20   Intake/Output Summary (Last 24 hours) at 09/19/2018 1034 Last data filed at 09/19/2018 0900 Gross per 24 hour  Intake 2732.58 ml  Output 1020 ml  Net 1712.58 ml   Filed Weights   09/17/18 0500 09/18/18 0500 09/19/18 0415  Weight: 74.7 kg 73.3 kg 76 kg     Examination:  Physical Exam  BP (!) 97/52    Pulse 71    Temp 98.3 F (36.8 C) (Rectal)    Resp 18    Ht 5\' 3"  (1.6 m)    Wt 76 kg    SpO2 94%    BMI 29.68 kg/m   General: Sedated female HEENT: Endotracheal tube gastric tube in place Neuro: Sedated on propofol drip sedated severely agitated CV: s1s2 rrr, no m/r/g PULM: even/non-labored, lungs bilaterally coarse rhonchi JJ:HERD, non-tender, bsx4 active tube feedings at goal Extremities: warm/dry, 1+ edema  Skin: no rashes or lesions es  Resolved Hospital Problem list     Assessment & Plan:   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  09/19/2018 no chest x-ray available P 09/19/2018 severe agitation Wean per protocol Chest x-ray  Agitation  09/19/2018 severe agitation with current sedation protocols.  Changes made as noted below P:  Resume PRN Versed on 09/19/2018 due to severe agitation Fentanyl pushes Continue valproic acid Continue clonidine DC propofol use PRN Versed   CKD III:  Lab Results  Component Value Date   CREATININE 1.98 (H) 09/19/2018   CREATININE 1.76 (H) 09/18/2018   CREATININE 1.99 (H) 09/17/2018     P Continue to hold diuretics Trend BMP  Strict I&O  HTN  Home norvasc  Home Coreg 25mg  BID Continues to be hypertensive  P Continue Norvasc Continue clonidine  DM2  CBG (last 3)  Recent Labs    09/18/18 2055 09/19/18 0025 09/19/18 0344  GLUCAP 182* 162* 214*    Lantus 25 units BID at home  P Sliding scale insulin protocol resistant Continue Lantus increased to 48 units daily on 09/19/2018  History of Atrial Fibrillation Current sinus rhythm with a rate of 70 P:  Currently on Eliquis Cardiac monitoring Labetalol as needed  Hx Depression Holding home cymbalta P:  Valproic acid started 09/18/2017 started low-dose Neurontin  Disposition: Critically ill in ICU.  Fever  09/19/2018 T-max 98.3 3/31 PICC  P:  Monitor vital signs   Best practice:  Diet: when extubated NPO  till bedside nursing swallow eval per protocol  Pain/Anxiety/Delirium protocol (if indicated): Fentanyl gtt + duragesic patch + PRN fent + PRN versed  VAP protocol (if indicated): Yes DVT prophylaxis: SQ Heparin  GI prophylaxis: protonix  Glucose control: Lantus, SSI  Mobility: bedrest Code Status: full Family Communication: 09/19/2007 family need to be updated by telephone Disposition: ICU   Labs   CBC: Recent Labs  Lab 09/16/18 0308 09/16/18 0405 09/17/18 0423 09/17/18 1048 09/19/18 0400  WBC  --  11.9* 12.6* 10.8* 17.4*  NEUTROABS  --  10.4* 11.0*  --  14.7*  HGB 10.2* 9.8* 10.1* 9.0* 9.0*  HCT 30.0* 33.0* 33.1* 30.6* 30.5*  MCV  --  98.8 99.4 100.7* 100.7*  PLT  --  312 305 293 742    Basic Metabolic Panel: Recent Labs  Lab 09/13/18 0516 09/14/18 0410 09/15/18 0406 09/16/18 0308 09/16/18 0405 09/17/18 0423 09/18/18 0423 09/19/18 0400  NA 142 142 142 142 145 147* 148* 148*  K 4.3 3.8 4.2 4.4 4.3 4.3 4.5 4.2  CL 104 104 101  --  104 105 106 104  CO2 27 30 32  --  31 35* 34* 34*  GLUCOSE 269* 308* 244*  --  204* 250* 153* 216*  BUN 70* 83* 101*  --  113* 128* 129* 127*  CREATININE 1.63* 1.82* 2.03*  --  2.10* 1.99* 1.76* 1.98*  CALCIUM 9.0 8.8* 9.0  --  9.2 9.8 10.6* 10.2  MG 2.0 1.9 2.0  --  2.2 2.2  --   --   PHOS 3.6 3.8 4.5  --  4.2  --   --   --    GFR: Estimated Creatinine Clearance: 23.2 mL/min (A) (by C-G formula based on SCr of 1.98 mg/dL (H)). Recent Labs  Lab 09/16/18 0405 09/17/18 0423 09/17/18 1048 09/19/18 0400  PROCALCITON  --   --   --  0.35  WBC 11.9* 12.6* 10.8* 17.4*    Liver Function Tests: Recent Labs  Lab 09/16/18 1610 09/19/18 0400  AST 16 22  ALT 9 13  ALKPHOS 38 36*  BILITOT 0.4 0.4  PROT 5.1* 5.5*  ALBUMIN 1.3* 1.6*   No results for input(s): LIPASE, AMYLASE in the last 168 hours. No results for input(s): AMMONIA in the last 168 hours.  ABG    Component Value Date/Time   PHART 7.367 09/17/2018 0235   PCO2ART  62.6 (H) 09/17/2018 0235   PO2ART 73.8 (L) 09/17/2018 0235   HCO3 35.1 (H) 09/17/2018 0235   TCO2 37 (H) 09/16/2018 0308   ACIDBASEDEF 2.0 09/11/2018 0401   O2SAT 93.7 09/17/2018 0235     Coagulation Profile: No results for input(s): INR, PROTIME in the last 168 hours.  Cardiac Enzymes: No results for input(s): CKTOTAL, CKMB, CKMBINDEX, TROPONINI in the last 168 hours.  HbA1C: Hgb  A1c MFr Bld  Date/Time Value Ref Range Status  04/19/2012 03:43 PM 7.3 (H) <5.7 % Final    Comment:    (NOTE)                                                                       According to the ADA Clinical Practice Recommendations for 2011, when HbA1c is used as a screening test:  >=6.5%   Diagnostic of Diabetes Mellitus           (if abnormal result is confirmed) 5.7-6.4%   Increased risk of developing Diabetes Mellitus References:Diagnosis and Classification of Diabetes Mellitus,Diabetes Care,2011,34(Suppl 1):S62-S69 and Standards of Medical Care in         Diabetes - 2011,Diabetes SAYT,0160,10 (Suppl 1):S11-S61.  04/18/2012 09:20 AM 7.3 (H) <5.7 % Final    Comment:    (NOTE)                                                                       According to the ADA Clinical Practice Recommendations for 2011, when HbA1c is used as a screening test:  >=6.5%   Diagnostic of Diabetes Mellitus           (if abnormal result is confirmed) 5.7-6.4%   Increased risk of developing Diabetes Mellitus References:Diagnosis and Classification of Diabetes Mellitus,Diabetes XNAT,5573,22(GURKY 1):S62-S69 and Standards of Medical Care in         Diabetes - 2011,Diabetes Care,2011,34 (Suppl 1):S11-S61.    CBG: Recent Labs  Lab 09/18/18 1139 09/18/18 1528 09/18/18 2055 09/19/18 0025 09/19/18 0344  GLUCAP 132* 99 182* 162* 214*      Critical care time: 40 min     Richardson Landry Allante Beane ACNP [facility] Pager [phone] till 1 pm If no answer page [phone] 09/19/2018, 10:35 AM

## 2018-09-20 ENCOUNTER — Inpatient Hospital Stay (HOSPITAL_COMMUNITY): Payer: Medicare Other

## 2018-09-20 DIAGNOSIS — R579 Shock, unspecified: Secondary | ICD-10-CM

## 2018-09-20 LAB — GLUCOSE, CAPILLARY
Glucose-Capillary: 128 mg/dL — ABNORMAL HIGH (ref 70–99)
Glucose-Capillary: 118 mg/dL — ABNORMAL HIGH (ref 70–99)
Glucose-Capillary: 164 mg/dL — ABNORMAL HIGH (ref 70–99)
Glucose-Capillary: 161 mg/dL — ABNORMAL HIGH (ref 70–99)
Glucose-Capillary: 71 mg/dL (ref 70–99)

## 2018-09-20 LAB — CBC WITH DIFFERENTIAL/PLATELET
Abs Immature Granulocytes: 0.13 10*3/uL — ABNORMAL HIGH (ref 0.00–0.07)
Basophils Absolute: 0 10*3/uL (ref 0.0–0.1)
Basophils Relative: 0 %
Eosinophils Absolute: 0.3 10*3/uL (ref 0.0–0.5)
Eosinophils Relative: 2 %
HCT: 31 % — ABNORMAL LOW (ref 36.0–46.0)
Hemoglobin: 9.2 g/dL — ABNORMAL LOW (ref 12.0–15.0)
Immature Granulocytes: 1 %
Lymphocytes Relative: 9 %
Lymphs Abs: 1.2 10*3/uL (ref 0.7–4.0)
MCH: 29.7 pg (ref 26.0–34.0)
MCHC: 29.7 g/dL — ABNORMAL LOW (ref 30.0–36.0)
MCV: 100 fL (ref 80.0–100.0)
Monocytes Absolute: 1 10*3/uL (ref 0.1–1.0)
Monocytes Relative: 7 %
Neutro Abs: 10.5 10*3/uL — ABNORMAL HIGH (ref 1.7–7.7)
Neutrophils Relative %: 81 %
Platelets: 290 10*3/uL (ref 150–400)
RBC: 3.1 MIL/uL — ABNORMAL LOW (ref 3.87–5.11)
RDW: 14.5 % (ref 11.5–15.5)
WBC: 13.1 10*3/uL — ABNORMAL HIGH (ref 4.0–10.5)
nRBC: 0 % (ref 0.0–0.2)

## 2018-09-20 LAB — BASIC METABOLIC PANEL
Anion gap: 9 (ref 5–15)
BUN: 119 mg/dL — ABNORMAL HIGH (ref 8–23)
CO2: 36 mmol/L — ABNORMAL HIGH (ref 22–32)
Calcium: 10.4 mg/dL — ABNORMAL HIGH (ref 8.9–10.3)
Chloride: 101 mmol/L (ref 98–111)
Creatinine, Ser: 1.81 mg/dL — ABNORMAL HIGH (ref 0.44–1.00)
GFR calc Af Amer: 31 mL/min — ABNORMAL LOW (ref >60)
GFR calc non Af Amer: 27 mL/min — ABNORMAL LOW (ref >60)
Glucose, Bld: 134 mg/dL — ABNORMAL HIGH (ref 70–99)
Potassium: 4.5 mmol/L (ref 3.5–5.1)
Sodium: 146 mmol/L — ABNORMAL HIGH (ref 135–145)

## 2018-09-20 LAB — TROPONIN I
Troponin I: 0.03 ng/mL (ref <0.03)
Troponin I: <0.03 ng/mL (ref <0.03)

## 2018-09-20 LAB — HEPATIC FUNCTION PANEL
ALT: 12 U/L (ref 0–44)
AST: 22 U/L (ref 15–41)
Albumin: 1.7 g/dL — ABNORMAL LOW (ref 3.5–5.0)
Alkaline Phosphatase: 39 U/L (ref 38–126)
Bilirubin, Direct: <0.1 mg/dL (ref 0.0–0.2)
Total Bilirubin: 0.4 mg/dL (ref 0.3–1.2)
Total Protein: 5.4 g/dL — ABNORMAL LOW (ref 6.5–8.1)

## 2018-09-20 LAB — C-REACTIVE PROTEIN: CRP: 2.2 mg/dL — ABNORMAL HIGH (ref <1.0)

## 2018-09-20 LAB — POCT I-STAT 7, (LYTES, BLD GAS, ICA,H+H)
Acid-Base Excess: 11 mmol/L — ABNORMAL HIGH (ref 0.0–2.0)
Bicarbonate: 36.6 mmol/L — ABNORMAL HIGH (ref 20.0–28.0)
Calcium, Ion: 1.44 mmol/L — ABNORMAL HIGH (ref 1.15–1.40)
HCT: 38 % (ref 36.0–46.0)
Hemoglobin: 12.9 g/dL (ref 12.0–15.0)
O2 Saturation: 95 %
Potassium: 4.4 mmol/L (ref 3.5–5.1)
Sodium: 142 mmol/L (ref 135–145)
TCO2: 38 mmol/L — ABNORMAL HIGH (ref 22–32)
pCO2 arterial: 52.3 mmHg — ABNORMAL HIGH (ref 32.0–48.0)
pH, Arterial: 7.453 — ABNORMAL HIGH (ref 7.350–7.450)
pO2, Arterial: 74 mmHg — ABNORMAL LOW (ref 83.0–108.0)

## 2018-09-20 LAB — PHOSPHORUS: Phosphorus: 5.8 mg/dL — ABNORMAL HIGH (ref 2.5–4.6)

## 2018-09-20 LAB — LACTATE DEHYDROGENASE: LDH: 184 U/L (ref 98–192)

## 2018-09-20 LAB — TRIGLYCERIDES: Triglycerides: 115 mg/dL (ref <150)

## 2018-09-20 LAB — FERRITIN: Ferritin: 433 ng/mL — ABNORMAL HIGH (ref 11–307)

## 2018-09-20 LAB — D-DIMER, QUANTITATIVE: D-Dimer, Quant: 2.21 ug/mL-FEU — ABNORMAL HIGH (ref 0.00–0.50)

## 2018-09-20 LAB — MAGNESIUM: Magnesium: 2.3 mg/dL (ref 1.7–2.4)

## 2018-09-20 MED ORDER — INSULIN GLARGINE 100 UNIT/ML ~~LOC~~ SOLN
45.0000 [IU] | SUBCUTANEOUS | Status: DC
  Administered 2018-09-21 – 2018-09-23 (×3): 45 [IU] via SUBCUTANEOUS
  Filled 2018-09-20 (×4): qty 0.45

## 2018-09-20 MED ORDER — INSULIN ASPART 100 UNIT/ML ~~LOC~~ SOLN
4.0000 [IU] | SUBCUTANEOUS | Status: DC
  Administered 2018-09-20 – 2018-09-23 (×10): 4 [IU] via SUBCUTANEOUS

## 2018-09-20 MED ORDER — NOREPINEPHRINE 4 MG/250ML-% IV SOLN
INTRAVENOUS | Status: AC
  Administered 2018-09-20: 2 ug/min via INTRAVENOUS
  Filled 2018-09-20: qty 250

## 2018-09-20 MED ORDER — NOREPINEPHRINE 4 MG/250ML-% IV SOLN
0.0000 ug/min | INTRAVENOUS | Status: DC
  Administered 2018-09-20: 10:00:00 2 ug/min via INTRAVENOUS
  Administered 2018-09-21: 5 ug/min via INTRAVENOUS
  Administered 2018-09-25: 10 ug/min via INTRAVENOUS
  Administered 2018-09-25: 8 ug/min via INTRAVENOUS
  Filled 2018-09-20 (×2): qty 250

## 2018-09-20 MED ORDER — PROPOFOL 1000 MG/100ML IV EMUL
5.0000 ug/kg/min | INTRAVENOUS | Status: DC
  Administered 2018-09-20: 20 ug/kg/min via INTRAVENOUS
  Administered 2018-09-21: 35 ug/kg/min via INTRAVENOUS
  Administered 2018-09-21: 30 ug/kg/min via INTRAVENOUS
  Administered 2018-09-21: 35 ug/kg/min via INTRAVENOUS
  Administered 2018-09-21: 40 ug/kg/min via INTRAVENOUS
  Filled 2018-09-20 (×4): qty 100

## 2018-09-20 NOTE — Progress Notes (Signed)
Pt SBP <80, decreased propofol gtt. SBP still <80 after 15 minutes. Discussed w/ Eric Form, NP. NP will come evaluate pt at bedside. RN will continue to closely monitor. Decreased propofol gtt again. See assoc documentation in eMAR.

## 2018-09-20 NOTE — Progress Notes (Addendum)
NAME:  FINLEY DINKEL, MRN:  [id number], DOB:  Apr 21, 1941, LOS: 57 ADMISSION DATE:  09/09/2018, CONSULTATION DATE:  09/09/18 REFERRING MD:  Regenia Skeeter - EM, CHIEF COMPLAINT:  SOB, diarrhea    Brief History   78 yo F presenting from home with c/o of cough, SOB, and abdominal pain x 2 weeks. Progressive hypoxia in ER now on NRB and CXR with bilateral infiltrates. 4/8 SBT to extubate, failed.   Significant Hospital Events   3/30> admitted, intubated 4/8 > weaned for 4 hours failed SBT due to agitation  Consults:  4/5 palliative care   Procedures:  3/30 ETT >>  09/10/2018 right PICC>  Significant Diagnostic Tests:  3/30 CXR >> Bilateral upper lobe airspace disease representing multifocal pneumonia versus pulmonary edema. 4/6 CXR> Bilateral multifocal PNA 4/10 CXR > Bilateral patchy infiltrates with worsening volume loss per right base  Micro Data:  3/30 BC x 2 > negative 3/30 RVP > neg 3/30 COVID-19 > positive 3/30 Trach asp >> normal flora 3/30 urine strep >> negative  3/30 urine legionella >> negative  Antimicrobials:  3/30 azithro >> completed  3/30 ceftriaxone >> completed 09/10/2018 Plaquenil >> completed  Interim history/subjective:  4/8 Cxry >>> improved aeration on RML, radiologist reads as improved RLL 4/8 WBC 10.8, improved from 4/7, Cr continues to improve - 1.76 SBT >>> failed, weaned for 4 hours 4/ 9/ 2020:  severe agitation precludes weaning 4/10>> Hypotensive despite decreasing sedation, levophed started  Objective   Blood pressure (!) 70/39, pulse 74, temperature 98.3 F (36.8 C), temperature source Rectal, resp. rate (!) 22, height 5\' 3"  (1.6 m), weight 75.2 kg, SpO2 91 %.    Vent Mode: PRVC FiO2 (%):  [40 %-50 %] 50 % Set Rate:  [22 bmp] 22 bmp Vt Set:  [310 mL] 310 mL PEEP:  [5 cmH20] 5 cmH20 Plateau Pressure:  [11 cmH20-12 cmH20] 11 cmH20   Intake/Output Summary (Last 24 hours) at 09/20/2018 0945 Last data filed at 09/20/2018 0900 Gross per 24 hour   Intake 2385.65 ml  Output 1400 ml  Net 985.65 ml     Physical Exam  BP (!) 70/39   Pulse 74   Temp 98.3 F (36.8 C) (Rectal)   Resp (!) 22   Ht 5\' 3"  (1.6 m)   Wt 75.2 kg   SpO2 91%   BMI 29.37 kg/m   General: Sedated, intubated  Female, less agitated  HEENT: Endotracheal tube,  gastric tube secure and intact, NCAT  Neuro: Sedated on propofol drip less agitated,  CV: s1s2 rrr, no m/r/g, NSR with few MF PVC's noted PULM: Bilateral chest excursion, even/non-labored, lungs bilaterally coarse rhonchi, diminished per bases. 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  09/20/2018>>Persistent patchy bilateral pulmonary infiltrates. Slight worsening of volume loss at the right lung base.  + 4.5 L since admission P 09/20/2018 agitation improved Wean per protocol Trend Chest x-ray ABG now and prn Consider placing arterial line Consider diuresis with pressor on board Conservative fluid management  Hypotension ? 2/2 sedation needs Plan Will start Levophed ( No tachycardia / fib noted) MAP goal is > 65 mm Hg Hold home antihypertensives Labetalol prn   Agitation  09/20/2018 severe agitation with current sedation protocols.  Changes made as noted below P:  RASS  Goal - 1 to -  2 Continue  PRN Versed on 09/20/2018 due to severe agitation Fentanyl pushes Continue valproic acid Continue clonidine Propofol resumed 4/09   CKD III:  Lab Results  Component Value Date   CREATININE 1.81 (H) 09/20/2018   CREATININE 1.98 (H) 09/19/2018   CREATININE 1.76 (H) 09/18/2018    Na up trending P Continue to hold diuretics Trend BMP Trend Mag Trend Phos  Strict I&O Maintain renal perfusion Avoid nephrotoxic medications  Hx HTN New Hypotension 4/10    P Hold Home norvasc  Start Levophed  MAP goal > 65   ABG now   DM2  CBG (last 3)  Recent Labs    09/19/18 2313 09/20/18 0522 09/20/18 0757  GLUCAP 143* 128* 118*    Lantus 25 units BID at home  Hypoglycemic x 2  Last 24 hours on 4/10 P Sliding scale insulin protocol resistant Decrease  Lantus increased to 45 units daily on 09/20/2018 Decrease TF coverage to 4 units Q 4 Continue CBG's and SSI  History of Atrial Fibrillation Current sinus rhythm with a rate of 70 P: Trend QTc  Currently on Eliquis Cardiac monitoring Labetalol as needed  Hx Depression Holding home cymbalta P:  Valproic acid started 09/18/2017 started low-dose Neurontin  Disposition: Critically ill in ICU.  Fever  09/20/2018 T-max 100.3 3/31 PICC  WBC 13,000 on 4/10 3/30 azithro >> completed  3/30 ceftriaxone >> completed 09/10/2018 Plaquenil >> completed P:  Monitor off ABX for now Low threshold to re-start Trend Fever curve and WBC Re-culture as is clinically indicated   Best practice:  Diet: when extubated NPO till bedside nursing swallow eval per protocol  Pain/Anxiety/Delirium protocol (if indicated): Fentanyl gtt + duragesic patch + PRN fent + PRN versed  VAP protocol (if indicated): Yes DVT prophylaxis: SQ Heparin  GI prophylaxis: protonix  Glucose control: Lantus, SSI  Mobility: bedrest Code Status: full Family Communication: 09/19/2007 family need to be updated by telephone Disposition: ICU   Labs   CBC: Recent Labs  Lab 09/16/18 0405 09/17/18 0423 09/17/18 1048 09/19/18 0400 09/20/18 0525  WBC 11.9* 12.6* 10.8* 17.4* 13.1*  NEUTROABS 10.4* 11.0*  --  14.7* 10.5*  HGB 9.8* 10.1* 9.0* 9.0* 9.2*  HCT 33.0* 33.1* 30.6* 30.5* 31.0*  MCV 98.8 99.4 100.7* 100.7* 100.0  PLT 312 305 293 295 540    Basic Metabolic Panel: Recent Labs  Lab 09/14/18 0410 09/15/18 0406  09/16/18 0405 09/17/18 0423 09/18/18 0423 09/19/18 0400 09/20/18 0525  NA 142 142   < > 145 147* 148* 148* 146*  K 3.8 4.2   < > 4.3 4.3 4.5 4.2 4.5  CL  104 101  --  104 105 106 104 101  CO2 30 32  --  31 35* 34* 34* 36*  GLUCOSE 308* 244*  --  204* 250* 153* 216* 134*  BUN 83* 101*  --  113* 128* 129* 127* 119*  CREATININE 1.82* 2.03*  --  2.10* 1.99* 1.76* 1.98* 1.81*  CALCIUM 8.8* 9.0  --  9.2 9.8 10.6* 10.2 10.4*  MG 1.9 2.0  --  2.2 2.2  --   --  2.3  PHOS 3.8 4.5  --  4.2  --   --   --  5.8*   < > = values in this interval not displayed.   GFR: Estimated Creatinine Clearance: 25.3 mL/min (A) (by C-G formula based on SCr of 1.81 mg/dL (H)). Recent Labs  Lab 09/17/18 0423 09/17/18 1048 09/19/18 0400 09/20/18 0525  PROCALCITON  --   --  0.35  --   WBC 12.6* 10.8* 17.4* 13.1*    Liver Function Tests: Recent Labs  Lab 09/16/18 1610 09/19/18 0400  AST 16 22  ALT 9 13  ALKPHOS 38 36*  BILITOT 0.4 0.4  PROT 5.1* 5.5*  ALBUMIN 1.3* 1.6*   No results for input(s): LIPASE, AMYLASE in the last 168 hours. No results for input(s): AMMONIA in the last 168 hours.  ABG    Component Value Date/Time   PHART 7.367 09/17/2018 0235   PCO2ART 62.6 (H) 09/17/2018 0235   PO2ART 73.8 (L) 09/17/2018 0235   HCO3 35.1 (H) 09/17/2018 0235   TCO2 37 (H) 09/16/2018 0308   ACIDBASEDEF 2.0 09/11/2018 0401   O2SAT 93.7 09/17/2018 0235     Coagulation Profile: No results for input(s): INR, PROTIME in the last 168 hours.  Cardiac Enzymes: No results for input(s): CKTOTAL, CKMB, CKMBINDEX, TROPONINI in the last 168 hours.  HbA1C: Hgb A1c MFr Bld  Date/Time Value Ref Range Status  04/19/2012 03:43 PM 7.3 (H) <5.7 % Final    Comment:    (NOTE)                                                                       According to the ADA Clinical Practice Recommendations for 2011, when HbA1c is used as a screening test:  >=6.5%   Diagnostic of Diabetes Mellitus           (if abnormal result is confirmed) 5.7-6.4%   Increased risk of developing Diabetes Mellitus References:Diagnosis and Classification of Diabetes Mellitus,Diabetes  Care,2011,34(Suppl 1):S62-S69 and Standards of Medical Care in         Diabetes - 2011,Diabetes GQQP,6195,09 (Suppl 1):S11-S61.  04/18/2012 09:20 AM 7.3 (H) <5.7 % Final    Comment:    (NOTE)                                                                       According to the ADA Clinical Practice Recommendations for 2011, when HbA1c is used as a screening test:  >=6.5%   Diagnostic of Diabetes Mellitus           (if abnormal result is confirmed) 5.7-6.4%   Increased risk of developing Diabetes Mellitus References:Diagnosis and Classification of Diabetes Mellitus,Diabetes TOIZ,1245,80(DXIPJ 1):S62-S69 and Standards of Medical Care in         Diabetes - 2011,Diabetes Care,2011,34 (Suppl 1):S11-S61.    CBG: Recent Labs  Lab 09/19/18 2046 09/19/18 2108 09/19/18 2313 09/20/18 0522 09/20/18 0757  GLUCAP 69* 125* 143* 128* 118*      Critical care time: 40 min      Eric Form, AGACNP-BC [facility] Pager [phone]  If no answer page [phone] 09/20/2018, 9:45 AM  __________________________________________________________________________  PCCM Atending:  78 yo FM, ARDS, AHRF on MV 2/2 COVID PNA  BP (!) 159/84   Pulse 83   Temp 98.3 F (36.8 C) (Rectal)   Resp (!) 27   Ht 5\' 3"  (1.6  m)   Wt 75.2 kg   SpO2 100%   BMI 29.37 kg/m   Gen: elderly fm, resting in bed, no distress, sedated on propofol  Lungs: BL vented breath sounds, looks comfortable, waves forms reviewed  Cardiac: RRR, sinus on tele  A: ARDS from COVID-19 Viral PNA, P:F148 (on 40% and 5 PEEP)   CKDIII, will follow  DMII Atrial fib on apixiban   P: Started low dose pressor, maintain MAP >91mmHg Holding propofol if on pressors, can restart if off pressors  I suspect was medication induced  If patients hypoxemia worsens would consider proning, P:F<150  But I think she is fine supine on 5PEEP would increase this first and fio2 before proning  Will discuss with nursing    This patient  is critically ill with multiple organ system failure; which, requires frequent high complexity decision making, assessment, support, evaluation, and titration of therapies. This was completed through the application of advanced monitoring technologies and extensive interpretation of multiple databases. During this encounter critical care time was devoted to patient care services described in this note for 38 minutes.   Garner Nash, DO [facility] 09/20/2018 2:24 PM  Personal pager: [phone] If unanswered, please page CCM On-call: [phone]

## 2018-09-21 ENCOUNTER — Inpatient Hospital Stay (HOSPITAL_COMMUNITY): Payer: Medicare Other

## 2018-09-21 DIAGNOSIS — A419 Sepsis, unspecified organism: Secondary | ICD-10-CM

## 2018-09-21 DIAGNOSIS — R6521 Severe sepsis with septic shock: Secondary | ICD-10-CM

## 2018-09-21 LAB — CBC WITH DIFFERENTIAL/PLATELET
Abs Immature Granulocytes: 0.14 10*3/uL — ABNORMAL HIGH (ref 0.00–0.07)
Basophils Absolute: 0 10*3/uL (ref 0.0–0.1)
Basophils Relative: 0 %
Eosinophils Absolute: 0.3 10*3/uL (ref 0.0–0.5)
Eosinophils Relative: 2 %
HCT: 31.2 % — ABNORMAL LOW (ref 36.0–46.0)
Hemoglobin: 9.4 g/dL — ABNORMAL LOW (ref 12.0–15.0)
Immature Granulocytes: 1 %
Lymphocytes Relative: 9 %
Lymphs Abs: 1.1 10*3/uL (ref 0.7–4.0)
MCH: 29.8 pg (ref 26.0–34.0)
MCHC: 30.1 g/dL (ref 30.0–36.0)
MCV: 99 fL (ref 80.0–100.0)
Monocytes Absolute: 0.9 10*3/uL (ref 0.1–1.0)
Monocytes Relative: 7 %
Neutro Abs: 10.7 10*3/uL — ABNORMAL HIGH (ref 1.7–7.7)
Neutrophils Relative %: 81 %
Platelets: 348 10*3/uL (ref 150–400)
RBC: 3.15 MIL/uL — ABNORMAL LOW (ref 3.87–5.11)
RDW: 14.5 % (ref 11.5–15.5)
WBC: 13.1 10*3/uL — ABNORMAL HIGH (ref 4.0–10.5)
nRBC: 0.2 % (ref 0.0–0.2)

## 2018-09-21 LAB — GLUCOSE, CAPILLARY
Glucose-Capillary: 103 mg/dL — ABNORMAL HIGH (ref 70–99)
Glucose-Capillary: 106 mg/dL — ABNORMAL HIGH (ref 70–99)
Glucose-Capillary: 123 mg/dL — ABNORMAL HIGH (ref 70–99)
Glucose-Capillary: 135 mg/dL — ABNORMAL HIGH (ref 70–99)
Glucose-Capillary: 88 mg/dL (ref 70–99)
Glucose-Capillary: 91 mg/dL (ref 70–99)
Glucose-Capillary: 92 mg/dL (ref 70–99)

## 2018-09-21 LAB — BASIC METABOLIC PANEL
Anion gap: 12 (ref 5–15)
BUN: 111 mg/dL — ABNORMAL HIGH (ref 8–23)
CO2: 32 mmol/L (ref 22–32)
Calcium: 10.4 mg/dL — ABNORMAL HIGH (ref 8.9–10.3)
Chloride: 99 mmol/L (ref 98–111)
Creatinine, Ser: 1.62 mg/dL — ABNORMAL HIGH (ref 0.44–1.00)
GFR calc Af Amer: 35 mL/min — ABNORMAL LOW (ref >60)
GFR calc non Af Amer: 30 mL/min — ABNORMAL LOW (ref >60)
Glucose, Bld: 109 mg/dL — ABNORMAL HIGH (ref 70–99)
Potassium: 4.6 mmol/L (ref 3.5–5.1)
Sodium: 143 mmol/L (ref 135–145)

## 2018-09-21 LAB — TROPONIN I: Troponin I: 0.04 ng/mL (ref <0.03)

## 2018-09-21 LAB — MAGNESIUM: Magnesium: 2.3 mg/dL (ref 1.7–2.4)

## 2018-09-21 LAB — PHOSPHORUS: Phosphorus: 6.3 mg/dL — ABNORMAL HIGH (ref 2.5–4.6)

## 2018-09-21 MED ORDER — FUROSEMIDE 10 MG/ML IJ SOLN
40.0000 mg | Freq: Once | INTRAMUSCULAR | Status: AC
  Filled 2018-09-21: qty 4

## 2018-09-21 MED ORDER — DEXMEDETOMIDINE HCL IN NACL 400 MCG/100ML IV SOLN
0.4000 ug/kg/h | INTRAVENOUS | Status: DC
  Administered 2018-09-21: 0.4 ug/kg/h via INTRAVENOUS
  Administered 2018-09-22: 1.1 ug/kg/h via INTRAVENOUS
  Administered 2018-09-22: 0.9 ug/kg/h via INTRAVENOUS
  Administered 2018-09-23 (×2): 0.4 ug/kg/h via INTRAVENOUS
  Administered 2018-09-24 – 2018-09-25 (×3): 0.6 ug/kg/h via INTRAVENOUS
  Filled 2018-09-21 (×8): qty 100

## 2018-09-21 MED ORDER — FUROSEMIDE 10 MG/ML IJ SOLN
40.0000 mg | Freq: Once | INTRAMUSCULAR | Status: AC
  Administered 2018-09-21: 40 mg via INTRAVENOUS

## 2018-09-21 NOTE — Progress Notes (Addendum)
NAME:  Sabrina Baker, MRN:  [id number], DOB:  [birth year], LOS: 12 ADMISSION DATE:  09/09/2018, CONSULTATION DATE:  09/09/18 REFERRING MD:  Regenia Skeeter - EM, CHIEF COMPLAINT:  SOB, diarrhea    Brief History   78 yo F presenting from home with c/o of cough, SOB, and abdominal pain x 2 weeks. Progressive hypoxia in ER now on NRB and CXR with bilateral infiltrates. 4/8 SBT to extubate, failed.   Significant Hospital Events   3/30> admitted, intubated 4/8 > weaned for 4 hours failed SBT due to agitation  Consults:  4/5 palliative care   Procedures:  3/30 ETT >>  09/10/2018 right PICC> 4/10 A Line> 4/10 Trialysis cath>  Significant Diagnostic Tests:  3/30 CXR >> Bilateral upper lobe airspace disease representing multifocal pneumonia versus pulmonary edema. 4/6 CXR> Bilateral multifocal PNA 4/10 CXR > Bilateral patchy infiltrates with worsening volume loss per right base  Micro Data:  3/30 BC x 2 > negative 3/30 RVP > neg 3/30 COVID-19 > positive 3/30 Trach asp >> normal flora 3/30 urine strep >> negative  3/30 urine legionella >> negative  Antimicrobials:  3/30 azithro >> completed  3/30 ceftriaxone >> completed 09/10/2018 Plaquenil >> completed  Interim history/subjective:  4/8 Cxry >>> improved aeration on RML, radiologist reads as improved RLL 4/8 WBC 10.8, improved from 4/7, Cr continues to improve - 1.76 SBT >>> failed, weaned for 4 hours 4/ 9/ 2020:  severe agitation precludes weaning 4/10>> Hypotensive despite decreasing sedation, levophed started 4/11>> pressors over night , most likely 2/2 sedation, CXR with improvement in   bilateral airspace disease. Small left effusion Objective   Blood pressure (!) 150/58, pulse 85, temperature 98 F (36.7 C), temperature source Rectal, resp. rate (!) 24, height 5\' 3"  (1.6 m), weight 77.1 kg, SpO2 95 %.    Vent Mode: PSV;CPAP FiO2 (%):  [40 %] 40 % Set Rate:  [22 bmp] 22 bmp Vt Set:  [300 mL-310 mL] 310 mL PEEP:  [5 cmH20]  5 cmH20 Pressure Support:  [10 cmH20] 10 cmH20 Plateau Pressure:  [10 cmH20-14 cmH20] 10 cmH20   Intake/Output Summary (Last 24 hours) at 09/21/2018 1424 Last data filed at 09/21/2018 1400 Gross per 24 hour  Intake 1953.53 ml  Output 1400 ml  Net 553.53 ml     Physical Exam  BP (!) 150/58   Pulse 85   Temp 98 F (36.7 C) (Rectal)   Resp (!) 24   Ht 5\' 3"  (1.6 m)   Wt 77.1 kg   SpO2 95%   BMI 30.11 kg/m   General: Sedated, intubated  Female, less agitated  HEENT: Endotracheal tube,  gastric tube secure and intact, NCAT  Neuro: Sedated on propofol drip less agitated, follows no commands, MAE x 4 CV: s1s2 rrr, no m/r/g, NSR with few MF PVC's noted PULM: Bilateral chest excursion, even/non-labored, lungs bilaterally coarse rhonchi, diminished per bases. YO:VZCH, non-tender, bsx4 active tube feedings at goal Extremities: warm/dry, 1+ edema  Skin: no rashes or lesions   Resolved Hospital Problem list     Assessment & Plan:   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  4/11>> CXR>> improvement in bilateral airspace disease. Small left effusion + 5.5  L since admission Weaning 4 hours 4/10 Weaning again 4/11 on 10/5, 40% P 09/21/2018 :  Continues to require heavy sedation Wean per protocol Trend Chest x-ray ABG 4/12 am  and prn Consider gentle diuresis  Conservative fluid management  Hypotension ? 2/2 sedation needs, because resolves with  decrease in sedation Plan Will start Levophed ( No tachycardia / fib noted) Titrate for MAP goal is > 65 mm Hg Hold home antihypertensives Labetalol prn  RASS-1  Agitation  09/20/2018 severe agitation with current sedation protocols.  Changes made as noted below P:  RASS  Goal - 1 to -2 Continue  PRN Versed on 09/20/2018 due to severe agitation Fentanyl pushes Continue valproic acid Continue clonidine Propofol resumed 4/09   CKD III:  Lab Results  Component Value Date   CREATININE 1.62 (H) 09/21/2018   CREATININE 1.81  (H) 09/20/2018   CREATININE 1.98 (H) 09/19/2018     Creatinine slight decrease  Hyper phos P Continue to hold diuretics Trend BMP Trend Mag Trend Phos  Strict I&O Maintain renal perfusion Avoid nephrotoxic medications  Hx HTN New Hypotension 4/10  Most likely 2/2 sedation needs   P Hold Home norvasc  Start Levophed  MAP goal > 65     DM2  CBG (last 3)  Recent Labs    09/21/18 0440 09/21/18 0821 09/21/18 1136  GLUCAP 103* 91 123*    Lantus 25 units BID at home  Hypoglycemic x 2  Last 24 hours on 4/10 P Sliding scale insulin protocol resistant Decrease  Lantus  to 45 units daily on 09/20/2018 Decrease TF coverage to 4 units Q 4 Continue CBG's and SSI  History of Atrial Fibrillation Current sinus rhythm with a rate of 70 P: Trend QTc  Currently on Eliquis Cardiac monitoring Labetalol as needed  Hx Depression Holding home cymbalta P:  Valproic acid started 09/18/2017 started low-dose Neurontin  Fever  09/20/2018 T-max 99 3/31 PICC  WBC 13,000 on 4/11 3/30 azithro >> completed  3/30 ceftriaxone >> completed 09/10/2018 Plaquenil >> completed P:  Monitor off ABX for now Low threshold to re-start Trend Fever curve and WBC Re-culture as is clinically indicated  Disposition: Critically ill in ICU.  Best practice:  Diet: when extubated NPO till bedside nursing swallow eval per protocol  Pain/Anxiety/Delirium protocol (if indicated): Fentanyl gtt + duragesic patch + PRN fent + PRN versed  VAP protocol (if indicated): Yes DVT prophylaxis: SQ Heparin  GI prophylaxis: protonix  Glucose control: Lantus, SSI  Mobility: bedrest Code Status: full Family Communication: 09/19/2007 family need to be updated by telephone Disposition: ICU   Labs   CBC: Recent Labs  Lab 09/16/18 0405 09/17/18 0423 09/17/18 1048 09/19/18 0400 09/20/18 0525 09/20/18 1204 09/21/18 0433  WBC 11.9* 12.6* 10.8* 17.4* 13.1*  --  13.1*  NEUTROABS 10.4* 11.0*  --  14.7* 10.5*   --  10.7*  HGB 9.8* 10.1* 9.0* 9.0* 9.2* 12.9 9.4*  HCT 33.0* 33.1* 30.6* 30.5* 31.0* 38.0 31.2*  MCV 98.8 99.4 100.7* 100.7* 100.0  --  99.0  PLT 312 305 293 295 290  --  657    Basic Metabolic Panel: Recent Labs  Lab 09/15/18 0406  09/16/18 0405 09/17/18 0423 09/18/18 0423 09/19/18 0400 09/20/18 0525 09/20/18 1204 09/21/18 0433  NA 142   < > 145 147* 148* 148* 146* 142 143  K 4.2   < > 4.3 4.3 4.5 4.2 4.5 4.4 4.6  CL 101  --  104 105 106 104 101  --  99  CO2 32  --  31 35* 34* 34* 36*  --  32  GLUCOSE 244*  --  204* 250* 153* 216* 134*  --  109*  BUN 101*  --  113* 128* 129* 127* 119*  --  111*  CREATININE  2.03*  --  2.10* 1.99* 1.76* 1.98* 1.81*  --  1.62*  CALCIUM 9.0  --  9.2 9.8 10.6* 10.2 10.4*  --  10.4*  MG 2.0  --  2.2 2.2  --   --  2.3  --  2.3  PHOS 4.5  --  4.2  --   --   --  5.8*  --  6.3*   < > = values in this interval not displayed.   GFR: Estimated Creatinine Clearance: 28.6 mL/min (A) (by C-G formula based on SCr of 1.62 mg/dL (H)). Recent Labs  Lab 09/17/18 1048 09/19/18 0400 09/20/18 0525 09/21/18 0433  PROCALCITON  --  0.35  --   --   WBC 10.8* 17.4* 13.1* 13.1*    Liver Function Tests: Recent Labs  Lab 09/16/18 1610 09/19/18 0400 09/20/18 1041  AST 16 22 22   ALT 9 13 12   ALKPHOS 38 36* 39  BILITOT 0.4 0.4 0.4  PROT 5.1* 5.5* 5.4*  ALBUMIN 1.3* 1.6* 1.7*   No results for input(s): LIPASE, AMYLASE in the last 168 hours. No results for input(s): AMMONIA in the last 168 hours.  ABG    Component Value Date/Time   PHART 7.453 (H) 09/20/2018 1204   PCO2ART 52.3 (H) 09/20/2018 1204   PO2ART 74.0 (L) 09/20/2018 1204   HCO3 36.6 (H) 09/20/2018 1204   TCO2 38 (H) 09/20/2018 1204   ACIDBASEDEF 2.0 09/11/2018 0401   O2SAT 95.0 09/20/2018 1204     Coagulation Profile: No results for input(s): INR, PROTIME in the last 168 hours.  Cardiac Enzymes: Recent Labs  Lab 09/20/18 1041 09/20/18 2222 09/21/18 0433  TROPONINI 0.03* <0.03  0.04*    HbA1C: Hgb A1c MFr Bld  Date/Time Value Ref Range Status  04/19/2012 03:43 PM 7.3 (H) <5.7 % Final    Comment:    (NOTE)                                                                       According to the ADA Clinical Practice Recommendations for 2011, when HbA1c is used as a screening test:  >=6.5%   Diagnostic of Diabetes Mellitus           (if abnormal result is confirmed) 5.7-6.4%   Increased risk of developing Diabetes Mellitus References:Diagnosis and Classification of Diabetes Mellitus,Diabetes YFVC,9449,67(RFFMB 1):S62-S69 and Standards of Medical Care in         Diabetes - 2011,Diabetes WGYK,5993,57 (Suppl 1):S11-S61.  04/18/2012 09:20 AM 7.3 (H) <5.7 % Final    Comment:    (NOTE)                                                                       According to the ADA Clinical Practice Recommendations for 2011, when HbA1c is used as a screening test:  >=6.5%   Diagnostic of Diabetes Mellitus           (if abnormal result is confirmed) 5.7-6.4%   Increased risk of developing  Diabetes Mellitus References:Diagnosis and Classification of Diabetes Mellitus,Diabetes NATF,5732,20(URKYH 1):S62-S69 and Standards of Medical Care in         Diabetes - 2011,Diabetes CWCB,7628,31 (Suppl 1):S11-S61.    CBG: Recent Labs  Lab 09/20/18 2210 09/21/18 0003 09/21/18 0440 09/21/18 0821 09/21/18 1136  GLUCAP 71 88 103* 91 123*      Critical care time: 40 min      Eric Form, AGACNP-BC [facility] PCCM Pager [phone]  If no answer page [phone] 09/21/2018, 2:24 PM  __________________________________________________________________________   PCCM Attending:   78 yo FM, ARDS, AHRF on MV 2/2 COVID PNA   BP (!) 75/40   Pulse 79   Temp 98 F (36.7 C) (Rectal)   Resp (!) 22   Ht 5\' 3"  (1.6 m)   Wt 77.1 kg   SpO2 91%   BMI 30.11 kg/m   Gen: Elderly FM, resting, appears calm Heart: sinus on tele Lungs: BL vented breath sounds   ABG     Component Value Date/Time   PHART 7.453 (H) 09/20/2018 1204   PCO2ART 52.3 (H) 09/20/2018 1204   PO2ART 74.0 (L) 09/20/2018 1204   HCO3 36.6 (H) 09/20/2018 1204   TCO2 38 (H) 09/20/2018 1204   ACIDBASEDEF 2.0 09/11/2018 0401   O2SAT 95.0 09/20/2018 1204    A:  ARDS COVID-19, viral PNA  CKDIII DMII  Atrial fib  +CFB  Elevated Scr   P:  Maintain MAP >65 mmHg Attempt SBT again today  She tolerated for a few hours  Given additional dose of lasix today  lantus and ssi   This patient is critically ill with multiple organ system failure; which, requires frequent high complexity decision making, assessment, support, evaluation, and titration of therapies. This was completed through the application of advanced monitoring technologies and extensive interpretation of multiple databases. During this encounter critical care time was devoted to patient care services described in this note for 36 minutes.   Garner Nash, DO [facility] 09/21/2018 4:42 PM  Personal pager: [phone] If unanswered, please page CCM On-call: [phone]

## 2018-09-21 NOTE — Progress Notes (Signed)
CRITICAL VALUE ALERT  Critical Value:  Troponin 0.04  Date & Time Notied:  09/21/2018 0640  Provider Notified: Dr. Gilford Raid  Orders Received/Actions taken: No new orders, continue to monitor.  Milford Cage, RN

## 2018-09-22 LAB — CBC WITH DIFFERENTIAL/PLATELET
Abs Immature Granulocytes: 0.12 10*3/uL — ABNORMAL HIGH (ref 0.00–0.07)
Abs Immature Granulocytes: 0.17 10*3/uL — ABNORMAL HIGH (ref 0.00–0.07)
Basophils Absolute: 0 10*3/uL (ref 0.0–0.1)
Basophils Absolute: 0.1 10*3/uL (ref 0.0–0.1)
Basophils Relative: 0 %
Basophils Relative: 0 %
Eosinophils Absolute: 0.3 10*3/uL (ref 0.0–0.5)
Eosinophils Absolute: 0.4 10*3/uL (ref 0.0–0.5)
Eosinophils Relative: 2 %
Eosinophils Relative: 2 %
HCT: 30.4 % — ABNORMAL LOW (ref 36.0–46.0)
HCT: 32.7 % — ABNORMAL LOW (ref 36.0–46.0)
Hemoglobin: 9.2 g/dL — ABNORMAL LOW (ref 12.0–15.0)
Hemoglobin: 9.7 g/dL — ABNORMAL LOW (ref 12.0–15.0)
Immature Granulocytes: 1 %
Immature Granulocytes: 1 %
Lymphocytes Relative: 8 %
Lymphocytes Relative: 6 %
Lymphs Abs: 1 10*3/uL (ref 0.7–4.0)
Lymphs Abs: 1.1 10*3/uL (ref 0.7–4.0)
MCH: 29.7 pg (ref 26.0–34.0)
MCH: 29.2 pg (ref 26.0–34.0)
MCHC: 30.3 g/dL (ref 30.0–36.0)
MCHC: 29.7 g/dL — ABNORMAL LOW (ref 30.0–36.0)
MCV: 98.1 fL (ref 80.0–100.0)
MCV: 98.5 fL (ref 80.0–100.0)
Monocytes Absolute: 0.8 10*3/uL (ref 0.1–1.0)
Monocytes Absolute: 1.1 10*3/uL — ABNORMAL HIGH (ref 0.1–1.0)
Monocytes Relative: 6 %
Monocytes Relative: 6 %
Neutro Abs: 10.3 10*3/uL — ABNORMAL HIGH (ref 1.7–7.7)
Neutro Abs: 15.7 10*3/uL — ABNORMAL HIGH (ref 1.7–7.7)
Neutrophils Relative %: 83 %
Neutrophils Relative %: 85 %
Platelets: 295 10*3/uL (ref 150–400)
Platelets: 353 10*3/uL (ref 150–400)
RBC: 3.1 MIL/uL — ABNORMAL LOW (ref 3.87–5.11)
RBC: 3.32 MIL/uL — ABNORMAL LOW (ref 3.87–5.11)
RDW: 14.4 % (ref 11.5–15.5)
RDW: 14.6 % (ref 11.5–15.5)
WBC: 12.4 10*3/uL — ABNORMAL HIGH (ref 4.0–10.5)
WBC: 18.4 10*3/uL — ABNORMAL HIGH (ref 4.0–10.5)
nRBC: 0 % (ref 0.0–0.2)
nRBC: 0 % (ref 0.0–0.2)

## 2018-09-22 LAB — BASIC METABOLIC PANEL
Anion gap: 12 (ref 5–15)
BUN: 100 mg/dL — ABNORMAL HIGH (ref 8–23)
CO2: 31 mmol/L (ref 22–32)
Calcium: 10.1 mg/dL (ref 8.9–10.3)
Chloride: 98 mmol/L (ref 98–111)
Creatinine, Ser: 1.83 mg/dL — ABNORMAL HIGH (ref 0.44–1.00)
GFR calc Af Amer: 30 mL/min — ABNORMAL LOW (ref >60)
GFR calc non Af Amer: 26 mL/min — ABNORMAL LOW (ref >60)
Glucose, Bld: 187 mg/dL — ABNORMAL HIGH (ref 70–99)
Potassium: 4.9 mmol/L (ref 3.5–5.1)
Sodium: 141 mmol/L (ref 135–145)

## 2018-09-22 LAB — POCT I-STAT 7, (LYTES, BLD GAS, ICA,H+H)
Acid-Base Excess: 11 mmol/L — ABNORMAL HIGH (ref 0.0–2.0)
Bicarbonate: 35.2 mmol/L — ABNORMAL HIGH (ref 20.0–28.0)
Calcium, Ion: 1.41 mmol/L — ABNORMAL HIGH (ref 1.15–1.40)
HCT: 31 % — ABNORMAL LOW (ref 36.0–46.0)
Hemoglobin: 10.5 g/dL — ABNORMAL LOW (ref 12.0–15.0)
O2 Saturation: 90 %
Patient temperature: 99.8
Potassium: 5.2 mmol/L — ABNORMAL HIGH (ref 3.5–5.1)
Sodium: 139 mmol/L (ref 135–145)
TCO2: 37 mmol/L — ABNORMAL HIGH (ref 22–32)
pCO2 arterial: 47.7 mmHg (ref 32.0–48.0)
pH, Arterial: 7.478 — ABNORMAL HIGH (ref 7.350–7.450)
pO2, Arterial: 57 mmHg — ABNORMAL LOW (ref 83.0–108.0)

## 2018-09-22 LAB — GLUCOSE, CAPILLARY
Glucose-Capillary: 106 mg/dL — ABNORMAL HIGH (ref 70–99)
Glucose-Capillary: 149 mg/dL — ABNORMAL HIGH (ref 70–99)
Glucose-Capillary: 123 mg/dL — ABNORMAL HIGH (ref 70–99)
Glucose-Capillary: 45 mg/dL — ABNORMAL LOW (ref 70–99)
Glucose-Capillary: 53 mg/dL — ABNORMAL LOW (ref 70–99)
Glucose-Capillary: 56 mg/dL — ABNORMAL LOW (ref 70–99)
Glucose-Capillary: 89 mg/dL (ref 70–99)
Glucose-Capillary: 116 mg/dL — ABNORMAL HIGH (ref 70–99)

## 2018-09-22 LAB — FERRITIN: Ferritin: 606 ng/mL — ABNORMAL HIGH (ref 11–307)

## 2018-09-22 LAB — MAGNESIUM: Magnesium: 2.2 mg/dL (ref 1.7–2.4)

## 2018-09-22 LAB — PHOSPHORUS
Phosphorus: 6.3 mg/dL — ABNORMAL HIGH (ref 2.5–4.6)
Phosphorus: 5.8 mg/dL — ABNORMAL HIGH (ref 2.5–4.6)

## 2018-09-22 LAB — TRIGLYCERIDES: Triglycerides: 76 mg/dL (ref <150)

## 2018-09-22 LAB — D-DIMER, QUANTITATIVE: D-Dimer, Quant: 1.03 ug/mL-FEU — ABNORMAL HIGH (ref 0.00–0.50)

## 2018-09-22 LAB — C-REACTIVE PROTEIN: CRP: 7.4 mg/dL — ABNORMAL HIGH (ref <1.0)

## 2018-09-22 LAB — BRAIN NATRIURETIC PEPTIDE: B Natriuretic Peptide: 90.7 pg/mL (ref 0.0–100.0)

## 2018-09-22 LAB — CK: Total CK: 11 U/L — ABNORMAL LOW (ref 38–234)

## 2018-09-22 LAB — LACTATE DEHYDROGENASE: LDH: 167 U/L (ref 98–192)

## 2018-09-22 MED ORDER — DEXTROSE 50 % IV SOLN
INTRAVENOUS | Status: AC
  Administered 2018-09-22: 50 mL
  Filled 2018-09-22: qty 50

## 2018-09-22 MED ORDER — FUROSEMIDE 10 MG/ML IJ SOLN
40.0000 mg | Freq: Once | INTRAMUSCULAR | Status: AC
  Administered 2018-09-22: 40 mg via INTRAVENOUS
  Filled 2018-09-22: qty 4

## 2018-09-22 NOTE — Progress Notes (Signed)
RN titrated precedex to off at 0900. Pt has been weaning since 8am. Pt moves legs when spoken too,but no other reactions yet. Pt in chair position in bed and frequent verbal contact by RN. Continue to monitor. Ellamae Sia

## 2018-09-22 NOTE — Progress Notes (Addendum)
NAME:  Sabrina Baker, MRN:  [id number], DOB:  1941-05-20, LOS: 54 ADMISSION DATE:  09/09/2018, CONSULTATION DATE:  09/09/18 REFERRING MD:  Regenia Skeeter - EM, CHIEF COMPLAINT:  SOB, diarrhea    Brief History   78 yo F presenting from home with c/o of cough, SOB, and abdominal pain x 2 weeks. Progressive hypoxia in ER now on NRB and CXR with bilateral infiltrates. 4/8 SBT to extubate, failed.   Significant Hospital Events   3/30> admitted, intubated 4/8 > weaned for 4 hours failed SBT due to agitation 4/11>> Weaned 4 hours 4/12>> Weaning 10/8  Consults:  4/5 palliative care   Procedures:  3/30 ETT >>  09/10/2018 right PICC> 4/10 A Line> 4/10 Trialysis cath>  Significant Diagnostic Tests:  3/30 CXR >> Bilateral upper lobe airspace disease representing multifocal pneumonia versus pulmonary edema. 4/6 CXR> Bilateral multifocal PNA 4/10 CXR > Bilateral patchy infiltrates with worsening volume loss per right base  Micro Data:  3/30 BC x 2 > negative 3/30 RVP > neg 3/30 COVID-19 > positive 3/30 Trach asp >> normal flora 3/30 urine strep >> negative  3/30 urine legionella >> negative  Antimicrobials:  3/30 azithro >> completed  3/30 ceftriaxone >> completed 09/10/2018 Plaquenil >> completed  Interim history/subjective:  4/8 Cxry >>> improved aeration on RML, radiologist reads as improved RLL 4/8 WBC 10.8, improved from 4/7, Cr continues to improve - 1.76 SBT >>> failed, weaned for 4 hours 4/ 9/ 2020:  severe agitation precludes weaning 4/10>> Hypotensive despite decreasing sedation, levophed started 4/11>> pressors over night , most likely 2/2 sedation, CXR with improvement in   bilateral airspace disease. Small left effusion Objective   Blood pressure (!) 149/76, pulse 94, temperature 99.1 F (37.3 C), temperature source Rectal, resp. rate 18, height 5\' 3"  (1.6 m), weight 76.8 kg, SpO2 93 %.    Vent Mode: PSV;CPAP FiO2 (%):  [40 %] 40 % Set Rate:  [22 bmp] 22 bmp Vt Set:   [310 mL-450 mL] 400 mL PEEP:  [5 cmH20-8 cmH20] 5 cmH20 Pressure Support:  [10 cmH20] 10 cmH20 Plateau Pressure:  [13 cmH20-18 cmH20] 18 cmH20   Intake/Output Summary (Last 24 hours) at 09/22/2018 1430 Last data filed at 09/22/2018 1136 Gross per 24 hour  Intake 2094.16 ml  Output 2255 ml  Net -160.84 ml     Physical Exam  BP (!) 149/76   Pulse 94   Temp 99.1 F (37.3 C) (Rectal)   Resp 18   Ht 5\' 3"  (1.6 m)   Wt 76.8 kg   SpO2 93%   BMI 29.99 kg/m   General: Sedated, intubated  Female, less agitated, but remains somnolent HEENT: Endotracheal tube,  gastric tube secure and intact, NCAT  Neuro: Sedated on propofol drip less agitated, will squeeze hand to command, MAE x 4 CV: s1s2 rrr, no m/r/g, NSR with few MF PVC's noted PULM: Bilateral chest excursion, even/non-labored, lungs bilaterally coarse rhonchi, diminished per bases. ES:PQZR, non-tender,ND,  bsx4 active, tube feedings at goal, Obese Extremities: warm/dry, 1+ edema  Skin: no rashes or lesions, warm and dry   Resolved Hospital Problem list     Assessment & Plan:   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  4/11>> CXR>> improvement in bilateral airspace disease. Small left effusion + 5.3  L since admission Weaning 4 hours 4/10 Weaning again 4/11 on 10/5, 40% P 09/21/2018 :  Sedation lightened WUA and SBT Q am Trend Chest x-ray ABG 4/13 am  and prn Lasix 40  mg x 1 on 4/12,  Continue diuresis as renal function allows  Goal > euvolemia Conservative fluid management  Hypotension ? 2/2 sedation needs, because resolves with decrease in sedation Plan Levophed off at present MAP goal is > 65 mm Hg Hold home antihypertensives Labetalol prn  RASS-1 BNP  Agitation  09/20/2018 severe agitation with current sedation protocols.  Changes made as noted below P:  RASS  Goal - 1 to -2 Doing well on Precedex Fentanyl pushes prn Continue valproic acid Continue clonidine    CKD III:  Lab Results  Component  Value Date   CREATININE 1.83 (H) 09/22/2018   CREATININE 1.62 (H) 09/21/2018   CREATININE 1.81 (H) 09/20/2018     Creatinine slight increase Hyper phos Potassium 4.9 P Lasix 40 x 1 on 4/12 Trend BMP Trend Mag Trend Phos  Strict I&O Maintain renal perfusion Avoid nephrotoxic medications  Hx HTN New Hypotension 4/10  Most likely 2/2 sedation needs Resolving, better on Precedex   P Hold Home norvasc  Levophed as needed to maintain Map Goal > 65  Plan: Additional Lasix 40 CXR and ABG in am Continue weaning>> Consider extubation 4/13 am if mental status improves   DM2  CBG (last 3)  Recent Labs    09/22/18 0321 09/22/18 0817 09/22/18 1135  GLUCAP 106* 149* 123*    Lantus 25 units BID at home  Hypoglycemic x 2  Last 24 hours on 4/10 P Sliding scale insulin protocol resistant Decrease  Lantus  to 45 units daily on 09/20/2018 Decrease TF coverage to 4 units Q 4 Continue CBG's and SSI  History of Atrial Fibrillation Current sinus rhythm with a rate of 70 P: Trend QTc  Currently on Eliquis Cardiac monitoring Labetalol as needed  Hx Depression Holding home cymbalta P:  Valproic acid started 09/18/2017 started low-dose Neurontin  Fever  09/20/2018 T-max 100.2 3/31 PICC  WBC 18.4 on 4/12 3/30 azithro >> completed  3/30 ceftriaxone >> completed 09/10/2018 Plaquenil >> completed P:  Monitor off ABX for now Low threshold to re-start Trend Fever curve and WBC Re-culture as is clinically indicated  Disposition: Critically ill in ICU.  Plan Lasix 40 x 1 on 4/12 Low grade fever WBC bump to 18.4 PCT 4/13 >> Consider restarting ABX Consider re-culturing Consider extubation 4/13 if more alert Continue weaning as patient tolerates   Best practice:  Diet: when extubated NPO till bedside nursing swallow eval per protocol  Pain/Anxiety/Delirium protocol (if indicated): Fentanyl gtt + duragesic patch + PRN fent + PRN versed  VAP protocol (if indicated):  Yes DVT prophylaxis: SQ Heparin  GI prophylaxis: protonix  Glucose control: Lantus, SSI  Mobility: bedrest Code Status: full Family Communication: 09/19/2007 family need to be updated by telephone Disposition: ICU   Labs   CBC: Recent Labs  Lab 09/19/18 0400 09/20/18 0525 09/20/18 1204 09/21/18 0433 09/22/18 0254 09/22/18 0520 09/22/18 0615  WBC 17.4* 13.1*  --  13.1* 12.4*  --  18.4*  NEUTROABS 14.7* 10.5*  --  10.7* 10.3*  --  15.7*  HGB 9.0* 9.2* 12.9 9.4* 9.2* 10.5* 9.7*  HCT 30.5* 31.0* 38.0 31.2* 30.4* 31.0* 32.7*  MCV 100.7* 100.0  --  99.0 98.1  --  98.5  PLT 295 290  --  348 295  --  009    Basic Metabolic Panel: Recent Labs  Lab 09/16/18 0405 09/17/18 0423 09/18/18 0423 09/19/18 0400 09/20/18 0525 09/20/18 1204 09/21/18 0433 09/22/18 0254 09/22/18 0520 09/22/18 0615  NA 145 147*  148* 148* 146* 142 143  --  139 141  K 4.3 4.3 4.5 4.2 4.5 4.4 4.6  --  5.2* 4.9  CL 104 105 106 104 101  --  99  --   --  98  CO2 31 35* 34* 34* 36*  --  32  --   --  31  GLUCOSE 204* 250* 153* 216* 134*  --  109*  --   --  187*  BUN 113* 128* 129* 127* 119*  --  111*  --   --  100*  CREATININE 2.10* 1.99* 1.76* 1.98* 1.81*  --  1.62*  --   --  1.83*  CALCIUM 9.2 9.8 10.6* 10.2 10.4*  --  10.4*  --   --  10.1  MG 2.2 2.2  --   --  2.3  --  2.3  --   --  2.2  PHOS 4.2  --   --   --  5.8*  --  6.3* 6.3*  --  5.8*   GFR: Estimated Creatinine Clearance: 25.3 mL/min (A) (by C-G formula based on SCr of 1.83 mg/dL (H)). Recent Labs  Lab 09/19/18 0400 09/20/18 0525 09/21/18 0433 09/22/18 0254 09/22/18 0615  PROCALCITON 0.35  --   --   --   --   WBC 17.4* 13.1* 13.1* 12.4* 18.4*    Liver Function Tests: Recent Labs  Lab 09/16/18 1610 09/19/18 0400 09/20/18 1041  AST 16 22 22   ALT 9 13 12   ALKPHOS 38 36* 39  BILITOT 0.4 0.4 0.4  PROT 5.1* 5.5* 5.4*  ALBUMIN 1.3* 1.6* 1.7*   No results for input(s): LIPASE, AMYLASE in the last 168 hours. No results for input(s):  AMMONIA in the last 168 hours.  ABG    Component Value Date/Time   PHART 7.478 (H) 09/22/2018 0520   PCO2ART 47.7 09/22/2018 0520   PO2ART 57.0 (L) 09/22/2018 0520   HCO3 35.2 (H) 09/22/2018 0520   TCO2 37 (H) 09/22/2018 0520   ACIDBASEDEF 2.0 09/11/2018 0401   O2SAT 90.0 09/22/2018 0520     Coagulation Profile: No results for input(s): INR, PROTIME in the last 168 hours.  Cardiac Enzymes: Recent Labs  Lab 09/20/18 1041 09/20/18 2222 09/21/18 0433 09/22/18 0254  CKTOTAL  --   --   --  11*  TROPONINI 0.03* <0.03 0.04*  --     HbA1C: Hgb A1c MFr Bld  Date/Time Value Ref Range Status  04/19/2012 03:43 PM 7.3 (H) <5.7 % Final    Comment:    (NOTE)                                                                       According to the ADA Clinical Practice Recommendations for 2011, when HbA1c is used as a screening test:  >=6.5%   Diagnostic of Diabetes Mellitus           (if abnormal result is confirmed) 5.7-6.4%   Increased risk of developing Diabetes Mellitus References:Diagnosis and Classification of Diabetes Mellitus,Diabetes YCXK,4818,56(DJSHF 1):S62-S69 and Standards of Medical Care in         Diabetes - 2011,Diabetes WYOV,7858,85 (Suppl 1):S11-S61.  04/18/2012 09:20 AM 7.3 (H) <5.7 % Final    Comment:    (  NOTE)                                                                       According to the ADA Clinical Practice Recommendations for 2011, when HbA1c is used as a screening test:  >=6.5%   Diagnostic of Diabetes Mellitus           (if abnormal result is confirmed) 5.7-6.4%   Increased risk of developing Diabetes Mellitus References:Diagnosis and Classification of Diabetes Mellitus,Diabetes LTRV,2023,34(DHWYS 1):S62-S69 and Standards of Medical Care in         Diabetes - 2011,Diabetes HUOH,7290,21 (Suppl 1):S11-S61.    CBG: Recent Labs  Lab 09/21/18 2023 09/21/18 2339 09/22/18 0321 09/22/18 0817 09/22/18 1135  GLUCAP 135* 92 106* 149* 123*       Critical care time: 40 min      Eric Form, AGACNP-BC [facility] PCCM Pager [phone]  If no answer page [phone] 09/22/2018, 2:30 PM  _________________________________________________________________   PCCM:   78 yo FM, ARDS, AHRF 2/2 COVID19   BP (!) 145/66   Pulse 97   Temp 99.1 F (37.3 C) (Rectal)   Resp (!) 27   Ht 5\' 3"  (1.6 m)   Wt 76.8 kg   SpO2 97%   BMI 29.99 kg/m   Gen: elderly fm, intubated sedate, on vent, NAD Heart: regular, sinus on tele  Lungs: bl vented breath sounds, wave forms reviewed  A: AHRF, COVID19 PNA, ARDS CKDIII, Scr stable DMII Atrial fib  +CFB New fever   P: BCX, UCx Sputum cx today  Repeat CXR R/o secondary infection  Attempted sbt but mental status precludes  Continue diuresis  lantus and ssi   This patient is critically ill with multiple organ system failure; which, requires frequent high complexity decision making, assessment, support, evaluation, and titration of therapies. This was completed through the application of advanced monitoring technologies and extensive interpretation of multiple databases. During this encounter critical care time was devoted to patient care services described in this note for 34 minutes.   Garner Nash, DO [facility] 09/22/2018 3:08 PM  Personal pager: [phone] If unanswered, please page CCM On-call: [phone]

## 2018-09-22 NOTE — Progress Notes (Signed)
Talked Dr.Scattliffe about patient appearing air hungry with tidal volume set at 310. Patients respiratory rate 32-38 bpm. Increased tidal volumes to 450, respiratory rate decreased to 22-24 keeping plat pressure at 18-22. Patient appears a lot more comfortable at this time.

## 2018-09-22 NOTE — Progress Notes (Signed)
Hypoglycemic Event  CBG: 45  Treatment: d50  Symptoms: None  Follow-up CBG: Time:1635 CBG Result:56  Possible Reasons for Event: Inadequate meal intake  Comments/MD notified:tube feeding had been on hold anticipating extubation  d50 again and cbg up to 89 at [place], [place] Tremblay

## 2018-09-23 ENCOUNTER — Inpatient Hospital Stay (HOSPITAL_COMMUNITY): Payer: Medicare Other

## 2018-09-23 LAB — PHOSPHORUS: Phosphorus: 5.5 mg/dL — ABNORMAL HIGH (ref 2.5–4.6)

## 2018-09-23 LAB — CBC WITH DIFFERENTIAL/PLATELET
Abs Immature Granulocytes: 0.09 10*3/uL — ABNORMAL HIGH (ref 0.00–0.07)
Basophils Absolute: 0 10*3/uL (ref 0.0–0.1)
Basophils Relative: 0 %
Eosinophils Absolute: 0.3 10*3/uL (ref 0.0–0.5)
Eosinophils Relative: 2 %
HCT: 26.4 % — ABNORMAL LOW (ref 36.0–46.0)
Hemoglobin: 7.9 g/dL — ABNORMAL LOW (ref 12.0–15.0)
Immature Granulocytes: 1 %
Lymphocytes Relative: 7 %
Lymphs Abs: 0.9 10*3/uL (ref 0.7–4.0)
MCH: 29.4 pg (ref 26.0–34.0)
MCHC: 29.9 g/dL — ABNORMAL LOW (ref 30.0–36.0)
MCV: 98.1 fL (ref 80.0–100.0)
Monocytes Absolute: 1 10*3/uL (ref 0.1–1.0)
Monocytes Relative: 8 %
Neutro Abs: 10.2 10*3/uL — ABNORMAL HIGH (ref 1.7–7.7)
Neutrophils Relative %: 82 %
Platelets: 310 10*3/uL (ref 150–400)
RBC: 2.69 MIL/uL — ABNORMAL LOW (ref 3.87–5.11)
RDW: 14.9 % (ref 11.5–15.5)
WBC: 12.5 10*3/uL — ABNORMAL HIGH (ref 4.0–10.5)
nRBC: 0 % (ref 0.0–0.2)

## 2018-09-23 LAB — PROCALCITONIN: Procalcitonin: 0.55 ng/mL

## 2018-09-23 LAB — COMPREHENSIVE METABOLIC PANEL
ALT: 21 U/L (ref 0–44)
AST: 37 U/L (ref 15–41)
Albumin: 1.6 g/dL — ABNORMAL LOW (ref 3.5–5.0)
Alkaline Phosphatase: 35 U/L — ABNORMAL LOW (ref 38–126)
Anion gap: 10 (ref 5–15)
BUN: 93 mg/dL — ABNORMAL HIGH (ref 8–23)
CO2: 36 mmol/L — ABNORMAL HIGH (ref 22–32)
Calcium: 10 mg/dL (ref 8.9–10.3)
Chloride: 99 mmol/L (ref 98–111)
Creatinine, Ser: 1.67 mg/dL — ABNORMAL HIGH (ref 0.44–1.00)
GFR calc Af Amer: 34 mL/min — ABNORMAL LOW (ref >60)
GFR calc non Af Amer: 29 mL/min — ABNORMAL LOW (ref >60)
Glucose, Bld: 60 mg/dL — ABNORMAL LOW (ref 70–99)
Potassium: 4.1 mmol/L (ref 3.5–5.1)
Sodium: 145 mmol/L (ref 135–145)
Total Bilirubin: 0.5 mg/dL (ref 0.3–1.2)
Total Protein: 5.3 g/dL — ABNORMAL LOW (ref 6.5–8.1)

## 2018-09-23 LAB — GLUCOSE, CAPILLARY
Glucose-Capillary: 102 mg/dL — ABNORMAL HIGH (ref 70–99)
Glucose-Capillary: 110 mg/dL — ABNORMAL HIGH (ref 70–99)
Glucose-Capillary: 46 mg/dL — ABNORMAL LOW (ref 70–99)
Glucose-Capillary: 58 mg/dL — ABNORMAL LOW (ref 70–99)
Glucose-Capillary: 117 mg/dL — ABNORMAL HIGH (ref 70–99)
Glucose-Capillary: 106 mg/dL — ABNORMAL HIGH (ref 70–99)
Glucose-Capillary: 36 mg/dL — CL (ref 70–99)
Glucose-Capillary: 120 mg/dL — ABNORMAL HIGH (ref 70–99)
Glucose-Capillary: 115 mg/dL — ABNORMAL HIGH (ref 70–99)

## 2018-09-23 LAB — MAGNESIUM: Magnesium: 2.1 mg/dL (ref 1.7–2.4)

## 2018-09-23 LAB — C-REACTIVE PROTEIN: CRP: 12.2 mg/dL — ABNORMAL HIGH (ref <1.0)

## 2018-09-23 MED ORDER — FUROSEMIDE 10 MG/ML IJ SOLN
20.0000 mg | Freq: Once | INTRAMUSCULAR | Status: AC
  Administered 2018-09-23: 20 mg via INTRAVENOUS
  Filled 2018-09-23: qty 2

## 2018-09-23 MED ORDER — HYDRALAZINE HCL 20 MG/ML IJ SOLN
10.0000 mg | Freq: Four times a day (QID) | INTRAMUSCULAR | Status: DC | PRN
  Administered 2018-09-25 – 2018-10-05 (×9): 10 mg via INTRAVENOUS
  Filled 2018-09-23 (×11): qty 1

## 2018-09-23 MED ORDER — CLONAZEPAM 0.5 MG PO TBDP
0.5000 mg | ORAL_TABLET | Freq: Two times a day (BID) | ORAL | Status: DC
  Administered 2018-09-23: 22:00:00 0.5 mg
  Filled 2018-09-23: qty 1

## 2018-09-23 MED ORDER — CLONAZEPAM 1 MG PO TABS: 1.0000 mg | ORAL_TABLET | Freq: Two times a day (BID) | ORAL | Status: DC

## 2018-09-23 MED ORDER — DEXTROSE 50 % IV SOLN
25.0000 g | INTRAVENOUS | Status: AC
  Administered 2018-09-23: 25 g via INTRAVENOUS

## 2018-09-23 MED ORDER — DEXTROSE 50 % IV SOLN
25.0000 mL | Freq: Once | INTRAVENOUS | Status: AC
  Administered 2018-09-23: 25 mL via INTRAVENOUS

## 2018-09-23 MED ORDER — CLONAZEPAM 0.5 MG PO TABS: 0.5000 mg | ORAL_TABLET | Freq: Two times a day (BID) | ORAL | Status: DC

## 2018-09-23 MED ORDER — INSULIN GLARGINE 100 UNIT/ML ~~LOC~~ SOLN
30.0000 [IU] | SUBCUTANEOUS | Status: DC
  Filled 2018-09-23 (×2): qty 0.3

## 2018-09-23 MED ORDER — DEXTROSE 50 % IV SOLN
INTRAVENOUS | Status: AC
  Filled 2018-09-23: qty 50

## 2018-09-23 MED ORDER — INSULIN ASPART 100 UNIT/ML ~~LOC~~ SOLN: 4.0000 [IU] | SUBCUTANEOUS | Status: DC

## 2018-09-23 MED ORDER — INSULIN ASPART 100 UNIT/ML ~~LOC~~ SOLN
0.0000 [IU] | SUBCUTANEOUS | Status: DC
  Administered 2018-09-24: 3 [IU] via SUBCUTANEOUS
  Administered 2018-09-24: 1 [IU] via SUBCUTANEOUS
  Administered 2018-09-24: 2 [IU] via SUBCUTANEOUS
  Administered 2018-09-24: 1 [IU] via SUBCUTANEOUS
  Administered 2018-09-24: 2 [IU] via SUBCUTANEOUS
  Administered 2018-09-24: 1 [IU] via SUBCUTANEOUS
  Administered 2018-09-25: 2 [IU] via SUBCUTANEOUS
  Administered 2018-09-25 (×2): 3 [IU] via SUBCUTANEOUS
  Administered 2018-09-25: 2 [IU] via SUBCUTANEOUS
  Administered 2018-09-26: 5 [IU] via SUBCUTANEOUS

## 2018-09-23 NOTE — Progress Notes (Signed)
Inpatient Diabetes Program Recommendations  AACE/ADA: New Consensus Statement on Inpatient Glycemic Control (2015)  Target Ranges:  Prepandial:   less than 140 mg/dL      Peak postprandial:   less than 180 mg/dL (1-2 hours)      Critically ill patients:  140 - 180 mg/dL   Lab Results  Component Value Date   GLUCAP 106 (H) 09/23/2018   HGBA1C 7.3 (H) 04/19/2012    Review of Glycemic Control Results for RANYA, FIDDLER (MRN [id number]) as of 09/23/2018 12:55  Ref. Range 09/23/2018 04:48 09/23/2018 05:12 09/23/2018 09:00 09/23/2018 12:18  Glucose-Capillary Latest Ref Range: 70 - 99 mg/dL 58 (L) 110 (H) 117 (H) 106 (H)    Inpatient Diabetes Program Recommendations:    Consider reducing Lantus to 30 units daily, reduce Novolog correction to 0-9 q 4 hours, and hold Novolog tube feed coverage 4 units q 4 hours (if tube feeds held).  Thanks,  Adah Perl, RN, BC-ADM Inpatient Diabetes Coordinator Pager [phone] (8a-5p)

## 2018-09-23 NOTE — Progress Notes (Signed)
PROGRESS NOTE                                                                                                                                                                                                             Patient Demographics:    Sabrina Baker, is a 78 y.o. female, DOB - 24-Nov-1940, TKK:[id number]  Outpatient Primary MD for the patient is [name], MD    LOS - 51  Chief Complaint  Patient presents with   Abdominal Pain   Cough       Brief Narrative: Patient is a 78 y.o. female with PMHx of HTN, DM, CKD stage III, presented with shortness of breath and cough, found to have acute toxic respiratory failure due to ARDS in the setting of COVID-19 infection.  Significant hospital events: 3/30 Admitted, intubated 4/08 PSV 4 hours failed SBT due to agitation, CXR with improved aeration  4/09 Failed wean due to agitation 4/10 Hypotensive despite decreased sedation, levophed initiated 4/11 Weaned 4 hours 4/12 PSV 10/8 4/13 transfer to [facility]   Subjective:    Sabrina Baker is intubated and sedated.  Briefly awakens with gentle sternal rub.   Assessment  & Plan :  Acute Hypoxic Resp Failure due to Acute Covid 19 Viral Illness during the ongoing 2020 Covid 19 Pandemic: Continue full vent support, PCCM following.  Has completed a course of Rocephin/Zithromax and Plaquenil.  Sputum/blood cultures remain negative so far.  Continue SBT-but mental status needs to improve-sedative agents being decreased slowly.  Agitation/delirium: Continue Precedex-prn fentanyl and Versed (dosage adjusted earlier this morning).    AKI on CKD stage III: AKI likely hemodynamically mediated-improving with supportive care-adjust creatinine now close to usual baseline-monitor electrolytes.  Hypotension: Resolved-thought to be secondary to sedation-continue antihypertensives-failed to maintain MAP >65  PAF: Maintaining sinus  rhythm-continue Eliquis.  Anemia: Secondary to critical illness-no evidence of blood loss-follow hemoglobin, transfuse if < 7  DM-2: Hypoglycemic episode earlier this morning-Lantus reduced to 30 units, continue SSI-follow CBGs and adjust accordingly   Hypotension: Resolved-thought to be secondary to sedation-continue to maintain MAP >65  History of HTN: Continue to hold all antihypertensives given above.    Depression: Continue valproic acid and gabapentin  Vent Settings: Vent Mode: SIMV;PSV FiO2 (%):  [40 %] 40 % Set Rate:  [22 bmp] 22 bmp Vt Set:  [310 mL]  310 mL PEEP:  [5 cmH20-8 cmH20] 8 cmH20 Pressure Support:  [10 cmH20-15 cmH20] 15 cmH20 Plateau Pressure:  [16 cmH20-21 cmH20] 21 cmH20  ABG:    Component Value Date/Time   PHART 7.478 (H) 09/22/2018 0520   PCO2ART 47.7 09/22/2018 0520   PO2ART 57.0 (L) 09/22/2018 0520   HCO3 35.2 (H) 09/22/2018 0520   TCO2 37 (H) 09/22/2018 0520   ACIDBASEDEF 2.0 09/11/2018 0401   O2SAT 90.0 09/22/2018 0520    Condition - Extremely Guarded  Family Communication: Daughter/son called by PCCM earlier today  Code Status : Full code  Diet : N.p.o.-on tube feedings  Disposition Plan : Remain in the ICU  Consults : PCCM  Procedures  :   3/30>> ETT 3/21>> RUE PICC 4/10>> A line-out  GI prophylaxis: Protonix  DVT Prophylaxis  : Eliquis  Lab Results  Component Value Date   PLT 310 09/23/2018    Inpatient Medications  Scheduled Meds:  apixaban  5 mg Per Tube BID   chlorhexidine gluconate (MEDLINE KIT)  15 mL Mouth Rinse BID   Chlorhexidine Gluconate Cloth  6 each Topical Daily   clonazePAM  0.5 mg Per Tube BID   docusate  100 mg Per Tube BID   free water  300 mL Per Tube Q8H   furosemide  20 mg Intravenous Once   gabapentin  100 mg Per Tube QHS   insulin aspart  0-9 Units Subcutaneous Q4H   insulin aspart  4 Units Subcutaneous Q4H   [START ON 09/24/2018] insulin glargine  30 Units Subcutaneous Q24H    mouth rinse  15 mL Mouth Rinse 10 times per day   pantoprazole sodium  40 mg Per Tube Daily   sodium chloride flush  10-40 mL Intracatheter Q12H   valproic acid  250 mg Per Tube BID   Continuous Infusions:  sodium chloride Stopped (09/18/18 1618)   dexmedetomidine (PRECEDEX) IV infusion 0.4 mcg/kg/hr (09/23/18 1200)   feeding supplement (VITAL AF 1.2 CAL) 55 mL/hr at 09/22/18 1600   norepinephrine (LEVOPHED) Adult infusion Stopped (09/23/18 0850)   PRN Meds:.acetaminophen (TYLENOL) oral liquid 160 mg/5 mL, albuterol, bisacodyl, fentaNYL (SUBLIMAZE) injection, labetalol, midazolam, sodium chloride flush  Antibiotics  :    Anti-infectives (From admission, onward)   Start     Dose/Rate Route Frequency Ordered Stop   09/11/18 2200  hydroxychloroquine (PLAQUENIL) tablet 200 mg  Status:  Discontinued     200 mg Oral 2 times daily 09/10/18 2309 09/10/18 2319   09/11/18 2200  hydroxychloroquine (PLAQUENIL) tablet 200 mg     200 mg Per Tube 2 times daily 09/10/18 2319 09/15/18 0851   09/10/18 2330  hydroxychloroquine (PLAQUENIL) tablet 400 mg     400 mg Per Tube 2 times daily 09/10/18 2319 09/11/18 0823   09/10/18 2315  hydroxychloroquine (PLAQUENIL) tablet 400 mg  Status:  Discontinued     400 mg Oral 2 times daily 09/10/18 2309 09/10/18 2319   09/10/18 1300  azithromycin (ZITHROMAX) 500 mg in sodium chloride 0.9 % 250 mL IVPB     500 mg 250 mL/hr over 60 Minutes Intravenous Every 24 hours 09/09/18 1342 09/15/18 1531   09/10/18 1200  cefTRIAXone (ROCEPHIN) 1 g in sodium chloride 0.9 % 100 mL IVPB  Status:  Discontinued     1 g 200 mL/hr over 30 Minutes Intravenous Every 24 hours 09/09/18 1342 09/14/18 1436   09/09/18 1200  cefTRIAXone (ROCEPHIN) 1 g in sodium chloride 0.9 % 100 mL IVPB  1 g 200 mL/hr over 30 Minutes Intravenous  Once 09/09/18 1148 09/09/18 1248   09/09/18 1200  azithromycin (ZITHROMAX) 500 mg in sodium chloride 0.9 % 250 mL IVPB     500 mg 250 mL/hr over 60  Minutes Intravenous  Once 09/09/18 1148 09/09/18 1351       Time Spent in minutes  30   Oren Binet M.D on 09/23/2018 at 1:07 PM  To page go to [email or web address] - password [email or web address]  [facility] -  Office  [phone]  Total Critical Care time in examining the patient bedside, evaluating Lab work and other data, over half of the total time was spent in coordinating patient care on the floor or bedisde in talking to patient/family members, communicating with nursing Staff on the floor and sub specialists   to coordinate patients medical care and needs is  55 Minutes.   The condition which has caused critical injury/acute impairment of a vital organ system with a high probability of sudden clinically significant deterioration and can cause Potential Life threatening injury to this patient addressed today is acute hypoxic respiratory failure due to ARDS  See all Orders from today for further details  Admit date - 09/09/2018    14    Objective:   Vitals:   09/23/18 0900 09/23/18 1000 09/23/18 1100 09/23/18 1200  BP: (!) 157/63 (!) 153/76 (!) 164/71 (!) 105/55  Pulse: 92 95 90 83  Resp: 18 (!) _0 Temp:    99 F (37.2 C)  TempSrc:    Oral  SpO2: 96% 94% 99% 98%  Weight:      Height:        Wt Readings from Last 3 Encounters:  09/23/18 75.1 kg  06/21/17 70.3 kg  01/24/16 67.1 kg     Intake/Output Summary (Last 24 hours) at 09/23/2018 1307 Last data filed at 09/23/2018 1200 Gross per 24 hour  Intake 1011.94 ml  Output 1875 ml  Net -863.06 ml     Physical Exam Gen Exam:Intubated, Sedated. HEENT:atraumatic, normocephalic Chest: B/L clear to auscultation anteriorly CVS:S1S2 regular Abdomen:soft non tender, non distended Extremities:no edema Neurology: difficult to examine given sedation/intubation Skin: no rash   Data Review:    CBC Recent Labs  Lab 09/20/18 0525  09/21/18 0433 09/22/18 0254 09/22/18 0520 09/22/18 0615 09/23/18 0430  WBC 13.1*   --  13.1* 12.4*  --  18.4* 12.5*  HGB 9.2*   < > 9.4* 9.2* 10.5* 9.7* 7.9*  HCT 31.0*   < > 31.2* 30.4* 31.0* 32.7* 26.4*  PLT 290  --  348 295  --  353 310  MCV 100.0  --  99.0 98.1  --  98.5 98.1  MCH 29.7  --  29.8 29.7  --  29.2 29.4  MCHC 29.7*  --  30.1 30.3  --  29.7* 29.9*  RDW 14.5  --  14.5 14.4  --  14.6 14.9  LYMPHSABS 1.2  --  1.1 1.0  --  1.1 0.9  MONOABS 1.0  --  0.9 0.8  --  1.1* 1.0  EOSABS 0.3  --  0.3 0.3  --  0.4 0.3  BASOSABS 0.0  --  0.0 0.0  --  0.1 0.0   < > = values in this interval not displayed.    Chemistries  Recent Labs  Lab 09/16/18 1610 09/17/18 0423  09/19/18 0400 09/20/18 0525 09/20/18 1041 09/20/18 1204 09/21/18 0433 09/22/18 0520 09/22/18 0615 09/23/18 0430  NA  --  147*   < > 148* 146*  --  142 143 139 141 145  K  --  4.3   < > 4.2 4.5  --  4.4 4.6 5.2* 4.9 4.1  CL  --  105   < > 104 101  --   --  99  --  98 99  CO2  --  35*   < > 34* 36*  --   --  32  --  31 36*  GLUCOSE  --  250*   < > 216* 134*  --   --  109*  --  187* 60*  BUN  --  128*   < > 127* 119*  --   --  111*  --  100* 93*  CREATININE  --  1.99*   < > 1.98* 1.81*  --   --  1.62*  --  1.83* 1.67*  CALCIUM  --  9.8   < > 10.2 10.4*  --   --  10.4*  --  10.1 10.0  MG  --  2.2  --   --  2.3  --   --  2.3  --  2.2 2.1  AST 16  --   --  22  --  22  --   --   --   --  37  ALT 9  --   --  13  --  12  --   --   --   --  21  ALKPHOS 38  --   --  36*  --  39  --   --   --   --  35*  BILITOT 0.4  --   --  0.4  --  0.4  --   --   --   --  0.5   < > = values in this interval not displayed.   ------------------------------------------------------------------------------------------------------------------ Recent Labs    09/20/18 1514 09/22/18 0254  TRIG 115 76    Lab Results  Component Value Date   HGBA1C 7.3 (H) 04/19/2012   ------------------------------------------------------------------------------------------------------------------ No results for input(s): TSH, T4TOTAL,  T3FREE, THYROIDAB in the last 72 hours.  Invalid input(s): FREET3 ------------------------------------------------------------------------------------------------------------------ Recent Labs    09/22/18 0254  FERRITIN 606*    Coagulation profile No results for input(s): INR, PROTIME in the last 168 hours.  Recent Labs    09/22/18 0254  DDIMER 1.03*    Cardiac Enzymes Recent Labs  Lab 09/20/18 1041 09/20/18 2222 09/21/18 0433  TROPONINI 0.03* <0.03 0.04*   ------------------------------------------------------------------------------------------------------------------    Component Value Date/Time   BNP 90.7 09/22/2018 1600    Micro Results Recent Results (from the past 240 hour(s))  Culture, respiratory (non-expectorated)     Status: None (Preliminary result)   Collection Time: 09/22/18  4:00 PM  Result Value Ref Range Status   Specimen Description TRACHEAL ASPIRATE  Final   Special Requests Normal  Final   Gram Stain   Final    MODERATE WBC PRESENT,BOTH PMN AND MONONUCLEAR MODERATE GRAM POSITIVE COCCI IN PAIRS IN CLUSTERS MODERATE GRAM NEGATIVE RODS    Culture   Final    CULTURE REINCUBATED FOR BETTER GROWTH Performed at [facility], [place] [name], [place], [place] [address]    Report Status PENDING  Incomplete  Culture, blood (routine x 2)     Status: None (Preliminary result)   Collection Time: 09/22/18  4:13 PM  Result Value Ref Range Status   Specimen Description BLOOD LEFT ANTECUBITAL  Final   Special Requests   Final    BOTTLES DRAWN AEROBIC ONLY Blood Culture adequate volume   Culture   Final    NO GROWTH < 24 HOURS Performed at [facility], [address], [place], [place] [address]    Report Status PENDING  Incomplete  Culture, blood (routine x 2)     Status: None (Preliminary result)   Collection Time: 09/22/18  6:25 PM  Result Value Ref Range Status   Specimen Description BLOOD LEFT HAND  Final   Special Requests   Final     BOTTLES DRAWN AEROBIC AND ANAEROBIC Blood Culture adequate volume   Culture   Final    NO GROWTH < 24 HOURS Performed at [facility], [place] [address], [place], [place] [address]    Report Status PENDING  Incomplete    Radiology Reports Dg Chest Port 1 View  Result Date: 09/23/2018 CLINICAL DATA:  Respiratory failure. EXAM: PORTABLE CHEST 1 VIEW COMPARISON:  One-view chest x-ray 09/21/2018 FINDINGS: The heart size is normal. Endotracheal tube is stable. NG tube courses off the inferior border the film. Right-sided PICC line is stable and in satisfactory position. Bilateral interstitial and airspace disease is similar the prior exam. There is improved aeration at the left base. Increased airspace opacities are noted in the right middle and upper lobes. IMPRESSION: 1. Bilateral interstitial and airspace disease concerning for infection. There is slight increase on the right and some improvement on the left. 2. Support apparatus is stable. Electronically Signed   By: San Morelle M.D.   On: 09/23/2018 07:41   Dg Chest Port 1 View  Result Date: 09/21/2018 CLINICAL DATA:  Respiratory failure. EXAM: PORTABLE CHEST 1 VIEW COMPARISON:  Radiograph of September 21, 2018. FINDINGS: Stable cardiomediastinal silhouette. Endotracheal and nasogastric tubes are in grossly good position. Right-sided PICC line is unchanged. No pneumothorax or significant pleural effusion is noted. Stable bilateral lung opacities are noted concerning for pneumonia or possibly edema. Bony thorax is unremarkable. IMPRESSION: Stable support apparatus. Stable bilateral lung opacities as described above. Electronically Signed   By: Marijo Conception, M.D.   On: 09/21/2018 22:03   Dg Chest Port 1 View  Result Date: 09/21/2018 CLINICAL DATA:  Respiratory failure EXAM: PORTABLE CHEST 1 VIEW COMPARISON:  09/20/2018 FINDINGS: Endotracheal tube in good position. Right arm PICC tip in the proximal SVC unchanged. NG tube in the stomach.  Mild improvement in bilateral airspace disease. Small left effusion. IMPRESSION: Endotracheal tube in good position Diffuse bilateral airspace disease with mild improvement. Electronically Signed   By: Franchot Gallo M.D.   On: 09/21/2018 08:10   Dg Chest Port 1 View  Result Date: 09/20/2018 CLINICAL DATA:  Respiratory failure.  Ventilator support. EXAM: PORTABLE CHEST 1 VIEW COMPARISON:  09/18/2018 FINDINGS: Endotracheal tube tip is 4 cm above the carina. Orogastric tube enters the abdomen. Bilateral patchy pulmonary infiltrates appear quite similar, with slightly more volume loss at the right lung base. No new finding otherwise. IMPRESSION: Persistent patchy bilateral pulmonary infiltrates. Slight worsening of volume loss at the right lung base. Electronically Signed   By: Nelson Chimes M.D.   On: 09/20/2018 06:11   Dg Chest Port 1 View  Result Date: 09/18/2018 CLINICAL DATA:  ARDS, COVID+ Pt intubated on airborne and contact PPE: Gloves, gown, N95, goggles, bouffant. EXAM: PORTABLE CHEST - 1 VIEW COMPARISON:  09/16/2018 FINDINGS: Endotracheal tube, nasogastric tube, right arm PICC stable. Slight improvement in right lower lung airspace disease  with patchy hazy infiltrates in the left mid and lower lung and right upper lung as before. Heart size and mediastinal contours are within normal limits. No effusion. No pneumothorax. Fixation hardware in the proximal right humerus. IMPRESSION: 1. Patchy bilateral airspace disease, with slight improvement in right lower lung opacities since prior study. 2. Support hardware stable in position. Electronically Signed   By: Lucrezia Europe M.D.   On: 09/18/2018 08:05   Dg Chest Port 1 View  Result Date: 09/16/2018 CLINICAL DATA:  Respiratory failure EXAM: PORTABLE CHEST 1 VIEW COMPARISON:  Yesterday FINDINGS: Endotracheal tube tip between the clavicular heads and carina. The orogastric tube at least reaches the stomach. Right-sided central line with tip at the SVC. No  appreciable change in patchy bilateral interstitial and airspace opacity. Stable heart size. No evidence of air leak or pleural fluid. IMPRESSION: 1. Unchanged multifocal pneumonia. 2. Stable hardware positioning. Electronically Signed   By: Monte Fantasia M.D.   On: 09/16/2018 07:09   Dg Chest Port 1 View  Result Date: 09/15/2018 CLINICAL DATA:  Respiratory failure. Intubated patient. Follow-up exam. EXAM: PORTABLE CHEST 1 VIEW COMPARISON:  Prior studies, most recent 09/14/2018 FINDINGS: Cardiac silhouette is normal size.  No mediastinal or hilar masses. There are hazy bilateral airspace lung opacities which are without change from the previous day's exam. No new lung abnormalities. No convincing pleural effusion and no pneumothorax. Endotracheal tube, nasal/orogastric tube and right PICC are stable. IMPRESSION: 1. No change from previous day's study. 2. Persistent bilateral ground-glass lung opacities. 3. Stable well-positioned support apparatus. Electronically Signed   By: Lajean Manes M.D.   On: 09/15/2018 06:47   Dg Chest Port 1 View  Result Date: 09/14/2018 CLINICAL DATA:  Respiratory failure.  COVID-19. EXAM: PORTABLE CHEST 1 VIEW COMPARISON:  09/13/2018 and prior studies dating back to 09/09/2018. FINDINGS: Bilateral ground-glass lung opacities are without significant change from the prior exams. Atelectasis at the left lung base is stable. More confluent opacity at the right lung base is less prominent, which appears to be due to larger lung volumes on the current exam. No new lung abnormalities.  No pleural effusion or pneumothorax. Endotracheal tube, nasal/orogastric tube and right PICC are stable in well positioned. IMPRESSION: 1. No significant change from the previous day's study. 2. Persistent bilateral ground-glass lung opacities more confluent basilar opacities. 3. Stable well-positioned support apparatus. Electronically Signed   By: Lajean Manes M.D.   On: 09/14/2018 06:24   Dg Chest  Port 1 View  Result Date: 09/13/2018 CLINICAL DATA:  Respiratory failure EXAM: PORTABLE CHEST 1 VIEW COMPARISON:  Yesterday FINDINGS: Endotracheal tube tip between the clavicular heads and carina. The orogastric tube has been advanced and now at least reaches the stomach-as confirmed on interval KUB. Right PICC with tip at the SVC. Patchy bilateral airspace disease. Borderline heart size. No effusion or pneumothorax. IMPRESSION: Stable hardware positioning and bilateral pneumonia. Electronically Signed   By: Monte Fantasia M.D.   On: 09/13/2018 06:08   Dg Chest Port 1 View  Result Date: 09/12/2018 CLINICAL DATA:  History of endotracheal tube EXAM: PORTABLE CHEST 1 VIEW COMPARISON:  Yesterday FINDINGS: Endotracheal tube tip between the clavicular heads and carina. The orogastric tube tip is in the region of the GE junction. Right PICC with tip near the SVC origin. Patchy bilateral lung opacity with both interstitial and airspace appearance. No effusion or pneumothorax. Normal heart size. These results will be called to the ordering clinician or representative by the Radiologist Assistant,  and communication documented in the PACS or zVision Dashboard. IMPRESSION: 1. Shortened orogastric tube with tip now at the GE junction. 2. Other hardware positioning is stable. 3. Bilateral lung opacity with progression. Electronically Signed   By: Monte Fantasia M.D.   On: 09/12/2018 07:14   Dg Chest Port 1 View  Result Date: 09/11/2018 CLINICAL DATA:  Respiratory failure EXAM: PORTABLE CHEST 1 VIEW COMPARISON:  09/10/2018 FINDINGS: Endotracheal tube and NG tube are stable. Interval placement of right PICC line with the tip in the SVC. Patchy bilateral airspace disease similar to prior study. Heart is normal size. No visible effusions or acute bony abnormality. IMPRESSION: Right PICC line tip in the SVC. Remainder of the support devices stable. Stable patchy bilateral airspace disease. Electronically Signed   By: Rolm Baptise M.D.   On: 09/11/2018 07:36   Dg Chest Port 1 View  Result Date: 09/10/2018 CLINICAL DATA:  Respiratory failure EXAM: PORTABLE CHEST 1 VIEW COMPARISON:  09/09/2018 FINDINGS: Support devices are stable. Heart is normal size. Patchy bilateral airspace disease again noted, not significantly changed. No visible effusions or acute bony abnormality. IMPRESSION: Patchy bilateral airspace disease, not significantly changed. Electronically Signed   By: Rolm Baptise M.D.   On: 09/10/2018 07:39   Dg Chest Port 1 View  Result Date: 09/09/2018 CLINICAL DATA:  Respiratory failure. EXAM: PORTABLE CHEST 1 VIEW COMPARISON:  Chest x-ray from same day at 1 a.m. FINDINGS: Interval placement of an endotracheal tube with the tip 3.9 cm above the carina. Enteric tube entering the stomach with the tip below the field of view. The heart size and mediastinal contours are within normal limits. Patchy asymmetric, hazy opacities in both lungs again noted, slightly more consolidative appearance in the left mid lung and right lower lobe. No pleural effusion or pneumothorax. No acute osseous abnormality. IMPRESSION: 1. Appropriately positioned endotracheal tube. 2. Patchy asymmetric airspace disease in both lungs again noted, with slightly more focal consolidative appearance in the left mid lung and right lower lobe. Findings are nonspecific, but concerning for atypical infection, including potential viral pneumonia. Electronically Signed   By: Titus Dubin M.D.   On: 09/09/2018 17:38   Dg Chest Port 1 View  Result Date: 09/09/2018 CLINICAL DATA:  Short of breath, cough.  Diarrhea. EXAM: PORTABLE CHEST 1 VIEW COMPARISON:  Chest radiograph 05/29/2012 FINDINGS: Normal cardiac silhouette. There is bilateral fine airspace disease in the upper lobes. No pleural fluid. No pneumothorax. No acute osseous abnormality. RIGHT shoulder internal fixation. IMPRESSION: Bilateral upper lobe airspace disease representing multifocal pneumonia  versus pulmonary edema. Electronically Signed   By: Suzy Bouchard M.D.   On: 09/09/2018 11:27   Dg Abd Portable 1v  Result Date: 09/12/2018 CLINICAL DATA:  Orogastric tube placement. EXAM: PORTABLE ABDOMEN - 1 VIEW COMPARISON:  CT abdomen and pelvis June 24, 2018 FINDINGS: Nasogastric tube tip projects in gastric antrum. No intra-abdominal mass effect or pathologic calcifications. Included bowel gas pattern is nondilated and nonobstructive. Mild vascular calcifications. Interstitial and alveolar airspace opacities included lung bases better characterized on today's dedicated radiograph. Thoracolumbar levoscoliosis. IMPRESSION: 1. Nasogastric tube tip projects in gastric antrum. Electronically Signed   By: Elon Alas M.D.   On: 09/12/2018 22:13   [place] Ekg Site Rite  Result Date: 09/10/2018 If Site Rite image not attached, placement could not be confirmed due to current cardiac rhythm.

## 2018-09-23 NOTE — Progress Notes (Addendum)
NAME:  Sabrina Baker, MRN:  [id number], DOB:  Feb 23, 1941, LOS: 71 ADMISSION DATE:  09/09/2018, CONSULTATION DATE:  09/09/18 REFERRING MD: Regenia Skeeter, CHIEF COMPLAINT:  SOB, diarrhea    Brief History   78 y/o F admitted 3/30 from home with c/o of cough, SOB, and abdominal pain x 2 weeks. Progressive hypoxia in ER now on NRB and CXR with bilateral infiltrates. Intubated.  COVID Positive.     Significant Hospital Events   3/30 Admitted, intubated 4/08 PSV 4 hours failed SBT due to agitation, CXR with improved aeration  4/09 Failed wean due to agitation 4/10 Hypotensive despite decreased sedation, levophed initiated 4/11 Weaned 4 hours 4/12 PSV 10/8  Consults:  Palliative care 4/5  Procedures:  3/30 ETT >>  RUE PICC 3/31 >>  ALine 4/10 >> out  Significant Diagnostic Tests:      Micro Data:  3/30 BC x 2 > negative 3/30 RVP > neg 3/30 COVID-19 >> positive 3/30 Trach asp >> normal flora 3/30 urine strep >> negative  3/30 urine legionella >> negative  Antimicrobials:  3/30 azithro >> completed  3/30 ceftriaxone >> completed 09/10/2018 Plaquenil >> completed  Interim history/subjective:  Afebrile. On full support.  I/O- 1.9 L UOP in last 24 hours / net neg 967 for 24h.  Briefly on levophed this am, now off.   Objective   Blood pressure (!) 153/76, pulse 95, temperature 98.8 F (37.1 C), temperature source Oral, resp. rate (!) 24, height 5\' 3"  (1.6 m), weight 75.1 kg, SpO2 94 %.    Vent Mode: CPAP;PSV FiO2 (%):  [40 %] 40 % Set Rate:  [22 bmp] 22 bmp Vt Set:  [310 mL] 310 mL PEEP:  [5 cmH20-8 cmH20] 8 cmH20 Pressure Support:  [10 cmH20] 10 cmH20 Plateau Pressure:  [16 cmH20-21 cmH20] 21 cmH20   Intake/Output Summary (Last 24 hours) at 09/23/2018 1155 Last data filed at 09/23/2018 0800 Gross per 24 hour  Intake 904.01 ml  Output 1775 ml  Net -870.99 ml    Physical Exam General: elderly female lying in bed, critically ill appearing  HEENT: MM pink/moist, ETT Neuro:  opens eyes to voice, intermittently will nod, no follow commands  CV: s1s2 rrr, no m/r/g PULM: even/non-labored, lungs bilaterally diminished bases  TW:SFKC, non-tender, bsx4 active  Extremities: warm/dry, BUE 1+ edema, trace LE Skin: no rashes or lesions  Resolved Hospital Problem list     Assessment & Plan:   Acute Hypoxemic Respiratory Failure secondary to SARS-CoV-2 ARDS  P: Low Vt ventilation  ARDS protocol  PSV as tolerated  Follow CXR, ABG intermittently  VAP prevention measures   PRN albuterol   Agitated Delirium Sedation Needs on Mechanical Ventilation  P: Continue precedex  PRN Fentanyl Discontinue fentanyl patch Gabapentin 100 mg QHS Klonopin 0.5 mg BID  PRN versed  Valproic acid 250 BID  RASS Goal: 0 to -1 with vent synchrony   Hypotension -suspect sedation large component  Hx HTN P: Levophed for MAP > 65 Hold home antihypertensives   PRN labetalol for SBP>160  AKI on CKD III Hyperphosphatemia  P: Trend BMP / urinary output Replace electrolytes as indicated Avoid nephrotoxic agents, ensure adequate renal perfusion Lasix 20 mg IV x1    DM II  P: Reduce lantus to 30 units QD Change to Q4 sensitive SSI  Hold TF coverage if TF held   Hx Atrial Fibrillation P: Tele Monitoring  Continue eliquis PT   Depression P:  Continue valproic acid, gabapentin   Best practice:  Diet: NPO Pain/Anxiety/Delirium protocol (if indicated): in place  VAP protocol (if indicated): Yes DVT prophylaxis: Eliquis   GI prophylaxis: protonix  Glucose control: Lantus, SSI  Mobility: bedrest Code Status: full Family Communication: Daughter called for update 4/13, no answer.  Son called,  Disposition: ICU   Labs   CBC: Recent Labs  Lab 09/20/18 0525  09/21/18 0433 09/22/18 0254 09/22/18 0520 09/22/18 0615 09/23/18 0430  WBC 13.1*  --  13.1* 12.4*  --  18.4* 12.5*  NEUTROABS 10.5*  --  10.7* 10.3*  --  15.7* 10.2*  HGB 9.2*   < > 9.4* 9.2* 10.5* 9.7*  7.9*  HCT 31.0*   < > 31.2* 30.4* 31.0* 32.7* 26.4*  MCV 100.0  --  99.0 98.1  --  98.5 98.1  PLT 290  --  348 295  --  353 310   < > = values in this interval not displayed.    Basic Metabolic Panel: Recent Labs  Lab 09/17/18 0423  09/19/18 0400 09/20/18 0525 09/20/18 1204 09/21/18 0433 09/22/18 0254 09/22/18 0520 09/22/18 0615 09/23/18 0430  NA 147*   < > 148* 146* 142 143  --  139 141 145  K 4.3   < > 4.2 4.5 4.4 4.6  --  5.2* 4.9 4.1  CL 105   < > 104 101  --  99  --   --  98 99  CO2 35*   < > 34* 36*  --  32  --   --  31 36*  GLUCOSE 250*   < > 216* 134*  --  109*  --   --  187* 60*  BUN 128*   < > 127* 119*  --  111*  --   --  100* 93*  CREATININE 1.99*   < > 1.98* 1.81*  --  1.62*  --   --  1.83* 1.67*  CALCIUM 9.8   < > 10.2 10.4*  --  10.4*  --   --  10.1 10.0  MG 2.2  --   --  2.3  --  2.3  --   --  2.2 2.1  PHOS  --   --   --  5.8*  --  6.3* 6.3*  --  5.8* 5.5*   < > = values in this interval not displayed.   GFR: Estimated Creatinine Clearance: 27.4 mL/min (A) (by C-G formula based on SCr of 1.67 mg/dL (H)). Recent Labs  Lab 09/19/18 0400  09/21/18 0433 09/22/18 0254 09/22/18 0615 09/23/18 0430  PROCALCITON 0.35  --   --   --   --  0.55  WBC 17.4*   < > 13.1* 12.4* 18.4* 12.5*   < > = values in this interval not displayed.    Liver Function Tests: Recent Labs  Lab 09/16/18 1610 09/19/18 0400 09/20/18 1041 09/23/18 0430  AST 16 22 22  37  ALT 9 13 12 21   ALKPHOS 38 36* 39 35*  BILITOT 0.4 0.4 0.4 0.5  PROT 5.1* 5.5* 5.4* 5.3*  ALBUMIN 1.3* 1.6* 1.7* 1.6*   No results for input(s): LIPASE, AMYLASE in the last 168 hours. No results for input(s): AMMONIA in the last 168 hours.  ABG    Component Value Date/Time   PHART 7.478 (H) 09/22/2018 0520   PCO2ART 47.7 09/22/2018 0520   PO2ART 57.0 (L) 09/22/2018 0520   HCO3 35.2 (H) 09/22/2018 0520   TCO2 37 (H) 09/22/2018 0520   ACIDBASEDEF 2.0 09/11/2018 0401  O2SAT 90.0 09/22/2018 0520      Coagulation Profile: No results for input(s): INR, PROTIME in the last 168 hours.  Cardiac Enzymes: Recent Labs  Lab 09/20/18 1041 09/20/18 2222 09/21/18 0433 09/22/18 0254  CKTOTAL  --   --   --  11*  TROPONINI 0.03* <0.03 0.04*  --     HbA1C: Hgb A1c MFr Bld  Date/Time Value Ref Range Status  04/19/2012 03:43 PM 7.3 (H) <5.7 % Final    Comment:    (NOTE)                                                                       According to the ADA Clinical Practice Recommendations for 2011, when HbA1c is used as a screening test:  >=6.5%   Diagnostic of Diabetes Mellitus           (if abnormal result is confirmed) 5.7-6.4%   Increased risk of developing Diabetes Mellitus References:Diagnosis and Classification of Diabetes Mellitus,Diabetes IPJA,2505,39(JQBHA 1):S62-S69 and Standards of Medical Care in         Diabetes - 2011,Diabetes LPFX,9024,09 (Suppl 1):S11-S61.  04/18/2012 09:20 AM 7.3 (H) <5.7 % Final    Comment:    (NOTE)                                                                       According to the ADA Clinical Practice Recommendations for 2011, when HbA1c is used as a screening test:  >=6.5%   Diagnostic of Diabetes Mellitus           (if abnormal result is confirmed) 5.7-6.4%   Increased risk of developing Diabetes Mellitus References:Diagnosis and Classification of Diabetes Mellitus,Diabetes BDZH,2992,42(ASTMH 1):S62-S69 and Standards of Medical Care in         Diabetes - 2011,Diabetes Care,2011,34 (Suppl 1):S11-S61.    CBG: Recent Labs  Lab 09/23/18 0112 09/23/18 0445 09/23/18 0448 09/23/18 0512 09/23/18 0900  GLUCAP 102* 46* 58* 110* 117*      Critical care time: 30 minutes      Noe Gens, NP-C [facility] Pgr: [phone] or if no answer [phone] 09/23/2018, 12:49 PM   ________________________________________________________________________  PCCM Attending:  78 yo FM, ARDS improving, AHRF 2/2 COVID PNA    BP (!) 105/55 (BP Location: Left Arm)   Pulse 83   Temp 99 F (37.2 C) (Oral)   Resp 19   Ht 5\' 3"  (1.6 m)   Wt 75.1 kg   SpO2 94%   BMI 29.33 kg/m   Gen: elderly fm, intubated, sedated, comfortable on vent, continuous sedation stopped, NAD  Heart: Regular, sinus on tele  Lungs: BL vented breaths, wave forms reviewed  Cx: NGTD   A: AHRF on MV  COVID 19 PNA  CKDIII DMII Atrial Fib  Fever +CFB  P: Continue to monitor fever off abx  Cx neg, will follow  Decreasing scheduled sedation  Stopped fent patch today  Dropped clonazapam dosing  Slow weaning from PS/CPAP  Mental status precludes extubation at this time May need additional diuresis   This patient is critically ill with multiple organ system failure; which, requires frequent high complexity decision making, assessment, support, evaluation, and titration of therapies. This was completed through the application of advanced monitoring technologies and extensive interpretation of multiple databases. During this encounter critical care time was devoted to patient care services described in this note for 34 minutes.  Garner Nash, DO [facility] 09/23/2018 2:13 PM  Personal pager: [phone] If unanswered, please page CCM On-call: [phone]

## 2018-09-23 NOTE — Progress Notes (Signed)
Daughter-in-law phone number > [phone]   Noe Gens, NP-C [place] Pgr: [phone] or if no answer [phone] 09/23/2018, 1:51 PM

## 2018-09-23 NOTE — Progress Notes (Addendum)
Nutrition Follow-up RD working remotely.  DOCUMENTATION CODES:   Not applicable  INTERVENTION:   Recommend resume TF with:  Osmolite 1.2 at 50 ml/h (1200 ml per day)  Pro-stat 60 ml daily  Provides 1640 kcal, 97 gm protein, 984 ml free water daily  NUTRITION DIAGNOSIS:   Inadequate oral intake related to inability to eat as evidenced by NPO status.  Ongoing  GOAL:   Provide needs based on ASPEN/SCCM guidelines  Unmet, TF off   MONITOR:   Vent status, TF tolerance, I & O's, Labs, Skin  ASSESSMENT:   77 yo female with PMH of DM, HLD, HTN, CKD-3, neuromuscular disorder, B12 deficiency, osteopenia who was admitted with progressive SOB requiring intubation.  TF on hold since yesterday for weaning and potential extubation.     Patient remains intubated on ventilator support MV: 8.3 L/min Temp (24hrs), Avg:99.1 F (37.3 C), Min:98 F (36.7 C), Max:99.8 F (37.7 C)  Propofol: off  Labs reviewed. Phosphorus 5.5 (H), BUN 93 (H), creatinine 1.67 (H) Medications reviewed and include Lasix, Novolog, Lantus, precedex, free water flushes 300 ml every 8 hours.  NUTRITION - FOCUSED PHYSICAL EXAM:  unable to complete  Diet Order:   Diet Order    None      EDUCATION NEEDS:   No education needs have been identified at this time  Skin:  Skin Assessment: Reviewed RN Assessment  Last BM:  4/13  Height:   Ht Readings from Last 1 Encounters:  09/09/18 5\' 3"  (1.6 m)    Weight:   Wt Readings from Last 1 Encounters:  09/23/18 75.1 kg    Ideal Body Weight:  52.3 kg  BMI:  Body mass index is 29.33 kg/m.  Estimated Nutritional Needs:   Kcal:  1580  Protein:  90-110 gm  Fluid:  1.6 L    Molli Barrows, RD, LDN, [place] Pager [phone] After Hours Pager [phone]

## 2018-09-23 NOTE — Progress Notes (Signed)
[facility] attending physician  We received the patient today in transfer from [facility].  She has been weaning on the ventilator for the last several days.  She has COVID-19, ARDS.  She has been slow to wake up.  Spontaneous breathing trials have been successful over the last few days.  Urine output is within normal limits.  She is minimally responsive on my exam, has essentially clear lungs.  From a respiratory standpoint I am optimistic that we may be able to get her extubated over the next few days, we may need to start dialing back some of the sedatives that have been used to treat her agitated delirium.  Roselie Awkward, MD [facility] Pager: [phone] Cell: [phone] If no response, call [phone]

## 2018-09-24 ENCOUNTER — Inpatient Hospital Stay (HOSPITAL_COMMUNITY): Payer: Medicare Other

## 2018-09-24 LAB — BASIC METABOLIC PANEL
Anion gap: 10 (ref 5–15)
BUN: 90 mg/dL — ABNORMAL HIGH (ref 8–23)
CO2: 33 mmol/L — ABNORMAL HIGH (ref 22–32)
Calcium: 9.6 mg/dL (ref 8.9–10.3)
Chloride: 99 mmol/L (ref 98–111)
Creatinine, Ser: 1.63 mg/dL — ABNORMAL HIGH (ref 0.44–1.00)
GFR calc Af Amer: 35 mL/min — ABNORMAL LOW (ref >60)
GFR calc non Af Amer: 30 mL/min — ABNORMAL LOW (ref >60)
Glucose, Bld: 123 mg/dL — ABNORMAL HIGH (ref 70–99)
Potassium: 4.4 mmol/L (ref 3.5–5.1)
Sodium: 142 mmol/L (ref 135–145)

## 2018-09-24 LAB — CBC
HCT: 25.5 % — ABNORMAL LOW (ref 36.0–46.0)
Hemoglobin: 7.9 g/dL — ABNORMAL LOW (ref 12.0–15.0)
MCH: 31 pg (ref 26.0–34.0)
MCHC: 31 g/dL (ref 30.0–36.0)
MCV: 100 fL (ref 80.0–100.0)
Platelets: 326 10*3/uL (ref 150–400)
RBC: 2.55 MIL/uL — ABNORMAL LOW (ref 3.87–5.11)
RDW: 15.1 % (ref 11.5–15.5)
WBC: 14.5 10*3/uL — ABNORMAL HIGH (ref 4.0–10.5)
nRBC: 0 % (ref 0.0–0.2)

## 2018-09-24 LAB — GLUCOSE, CAPILLARY
Glucose-Capillary: 117 mg/dL — ABNORMAL HIGH (ref 70–99)
Glucose-Capillary: 126 mg/dL — ABNORMAL HIGH (ref 70–99)
Glucose-Capillary: 141 mg/dL — ABNORMAL HIGH (ref 70–99)
Glucose-Capillary: 125 mg/dL — ABNORMAL HIGH (ref 70–99)
Glucose-Capillary: 182 mg/dL — ABNORMAL HIGH (ref 70–99)
Glucose-Capillary: 159 mg/dL — ABNORMAL HIGH (ref 70–99)
Glucose-Capillary: 202 mg/dL — ABNORMAL HIGH (ref 70–99)

## 2018-09-24 LAB — URINE CULTURE
Culture: >=100000 — AB
Special Requests: NORMAL

## 2018-09-24 MED ORDER — PRO-STAT SUGAR FREE PO LIQD
30.0000 mL | Freq: Two times a day (BID) | ORAL | Status: DC
  Administered 2018-09-24 – 2018-09-26 (×4): 30 mL
  Filled 2018-09-24 (×5): qty 30

## 2018-09-24 MED ORDER — OSMOLITE 1.2 CAL PO LIQD
1000.0000 mL | ORAL | Status: DC
  Administered 2018-09-24 – 2018-09-25 (×4): 1000 mL
  Filled 2018-09-24 (×3): qty 1000

## 2018-09-24 MED ORDER — HALOPERIDOL LACTATE 5 MG/ML IJ SOLN
5.0000 mg | Freq: Once | INTRAMUSCULAR | Status: AC
  Administered 2018-09-24: 5 mg via INTRAMUSCULAR
  Filled 2018-09-24: qty 1

## 2018-09-24 MED ORDER — FUROSEMIDE 10 MG/ML IJ SOLN
40.0000 mg | Freq: Once | INTRAMUSCULAR | Status: AC
  Administered 2018-09-24: 40 mg via INTRAVENOUS
  Filled 2018-09-24: qty 4

## 2018-09-24 MED ORDER — FUROSEMIDE 10 MG/ML IJ SOLN
40.0000 mg | Freq: Every day | INTRAMUSCULAR | Status: DC
  Administered 2018-09-25 – 2018-09-26 (×2): 40 mg via INTRAVENOUS
  Filled 2018-09-24 (×2): qty 4

## 2018-09-24 MED ORDER — CLONAZEPAM 0.5 MG PO TBDP
1.0000 mg | ORAL_TABLET | Freq: Two times a day (BID) | ORAL | Status: DC
  Administered 2018-09-24: 10:00:00 1 mg
  Filled 2018-09-24: qty 2

## 2018-09-24 MED ORDER — INSULIN GLARGINE 100 UNIT/ML ~~LOC~~ SOLN
22.0000 [IU] | SUBCUTANEOUS | Status: DC
  Administered 2018-09-24 – 2018-09-26 (×3): 22 [IU] via SUBCUTANEOUS
  Filled 2018-09-24 (×3): qty 0.22

## 2018-09-24 MED ORDER — CLONAZEPAM 0.5 MG PO TBDP
0.5000 mg | ORAL_TABLET | Freq: Two times a day (BID) | ORAL | Status: DC
  Administered 2018-09-24: 0.5 mg
  Filled 2018-09-24 (×2): qty 1

## 2018-09-24 MED ORDER — POLYETHYLENE GLYCOL 3350 17 G PO PACK
17.0000 g | PACK | Freq: Every day | ORAL | Status: DC
  Administered 2018-09-30: 17 g
  Filled 2018-09-24 (×3): qty 1

## 2018-09-24 NOTE — Progress Notes (Signed)
PROGRESS NOTE                                                                                                                                                                                                             Patient Demographics:    Sabrina Baker, is a 78 y.o. female, DOB - 1941-03-12, JHH:[id number]  Outpatient Primary MD for the patient is [name], MD    LOS - 17  Chief Complaint  Patient presents with   Abdominal Pain   Cough       Brief Narrative: Patient is a 78 y.o. female with PMHx of HTN, DM, CKD stage III, presented with shortness of breath and cough, found to have acute toxic respiratory failure due to ARDS in the setting of COVID-19 infection.  Significant hospital events: 3/30 Admitted, intubated 4/08 PSV 4 hours failed SBT due to agitation, CXR with improved aeration  4/09 Failed wean due to agitation 4/10 Hypotensive despite decreased sedation, levophed initiated 4/11 Weaned 4 hours 4/12 PSV 10/8 4/13 transfer to [facility]   Subjective:       Assessment  & Plan :  Acute Hypoxic Resp Failure due to Acute Covid 19 Viral Illness during the ongoing 2020 Covid 19 Pandemic: Improved-tolerating spontaneous breathing trial well since this morning-however still somewhat lethargic and sedated-suspect needs another day or so supportive care before we can consider extubation.  Lasix 40 mg x 1 earlier today-reassess volume status/intake/output tomorrow.  Agitation/delirium: Seems to be somewhat better compared to yesterday-continue Precedex when back on full ventilator support-we will decrease Klonopin dose further-continue to use as needed fentanyl and Versed.   AKI on CKD stage III: AKI likely hemodynamically mediated-improving with supportive care-adjust creatinine now close to usual baseline-monitor electrolytes.  Hypotension: Resolved-thought to be secondary to sedation-continue  antihypertensives-failed to maintain MAP >65  PAF: Maintaining sinus rhythm-continue Eliquis.  Anemia: Secondary to critical illness-no evidence of blood loss-follow hemoglobin, transfuse if < 7  DM-2: Had another hypoglycemic episode yesterday afternoon-will decrease Lantus to 22 units, stop scheduled NovoLog-continue SSI-CBGs and adjust accordingly.   History of HTN: Continue to hold all antihypertensives given above.    Depression: Continue valproic acid and gabapentin  Vent Settings: Vent Mode: PRVC FiO2 (%):  [40 %] 40 % Set Rate:  [22 bmp] 22 bmp Vt Set:  [310 mL] 310 mL PEEP:  [  8 cmH20] 8 cmH20 Pressure Support:  [10 cmH20-15 cmH20] 15 cmH20 Plateau Pressure:  [7 MAU63-33 cmH20] 7 cmH20  ABG:    Component Value Date/Time   PHART 7.478 (H) 09/22/2018 0520   PCO2ART 47.7 09/22/2018 0520   PO2ART 57.0 (L) 09/22/2018 0520   HCO3 35.2 (H) 09/22/2018 0520   TCO2 37 (H) 09/22/2018 0520   ACIDBASEDEF 2.0 09/11/2018 0401   O2SAT 90.0 09/22/2018 0520    Condition - Extremely Guarded  Family Communication: Son updated on the phone on 4/14-unable to reach daughter Code Status : Full code  Diet : N.p.o.-on tube feedings  Disposition Plan : Remain in the ICU  Consults : PCCM  Procedures  :   3/30>> ETT 3/21>> RUE PICC 4/10>> A line-out  GI prophylaxis: Protonix  DVT Prophylaxis  : Eliquis  Lab Results  Component Value Date   PLT 310 09/23/2018    Inpatient Medications  Scheduled Meds:  apixaban  5 mg Per Tube BID   chlorhexidine gluconate (MEDLINE KIT)  15 mL Mouth Rinse BID   Chlorhexidine Gluconate Cloth  6 each Topical Daily   clonazepam  0.5 mg Per Tube BID   docusate  100 mg Per Tube BID   free water  300 mL Per Tube Q8H   furosemide  40 mg Intravenous Once   gabapentin  100 mg Per Tube QHS   insulin aspart  0-9 Units Subcutaneous Q4H   insulin glargine  22 Units Subcutaneous Q24H   mouth rinse  15 mL Mouth Rinse 10 times per day    pantoprazole sodium  40 mg Per Tube Daily   sodium chloride flush  10-40 mL Intracatheter Q12H   valproic acid  250 mg Per Tube BID   Continuous Infusions:  sodium chloride 10 mL/hr at 09/23/18 1400   dexmedetomidine (PRECEDEX) IV infusion 0.5 mcg/kg/hr (09/24/18 0600)   feeding supplement (VITAL AF 1.2 CAL) 1,000 mL (09/23/18 1605)   norepinephrine (LEVOPHED) Adult infusion Stopped (09/23/18 0850)   PRN Meds:.acetaminophen (TYLENOL) oral liquid 160 mg/5 mL, albuterol, bisacodyl, fentaNYL (SUBLIMAZE) injection, hydrALAZINE, labetalol, midazolam, sodium chloride flush  Antibiotics  :    Anti-infectives (From admission, onward)   Start     Dose/Rate Route Frequency Ordered Stop   09/11/18 2200  hydroxychloroquine (PLAQUENIL) tablet 200 mg  Status:  Discontinued     200 mg Oral 2 times daily 09/10/18 2309 09/10/18 2319   09/11/18 2200  hydroxychloroquine (PLAQUENIL) tablet 200 mg     200 mg Per Tube 2 times daily 09/10/18 2319 09/15/18 0851   09/10/18 2330  hydroxychloroquine (PLAQUENIL) tablet 400 mg     400 mg Per Tube 2 times daily 09/10/18 2319 09/11/18 0823   09/10/18 2315  hydroxychloroquine (PLAQUENIL) tablet 400 mg  Status:  Discontinued     400 mg Oral 2 times daily 09/10/18 2309 09/10/18 2319   09/10/18 1300  azithromycin (ZITHROMAX) 500 mg in sodium chloride 0.9 % 250 mL IVPB     500 mg 250 mL/hr over 60 Minutes Intravenous Every 24 hours 09/09/18 1342 09/15/18 1531   09/10/18 1200  cefTRIAXone (ROCEPHIN) 1 g in sodium chloride 0.9 % 100 mL IVPB  Status:  Discontinued     1 g 200 mL/hr over 30 Minutes Intravenous Every 24 hours 09/09/18 1342 09/14/18 1436   09/09/18 1200  cefTRIAXone (ROCEPHIN) 1 g in sodium chloride 0.9 % 100 mL IVPB     1 g 200 mL/hr over 30 Minutes Intravenous  Once 09/09/18 1148  09/09/18 1248   09/09/18 1200  azithromycin (ZITHROMAX) 500 mg in sodium chloride 0.9 % 250 mL IVPB     500 mg 250 mL/hr over 60 Minutes Intravenous  Once 09/09/18 1148  09/09/18 1351      Time spent 35 minutes   Oren Binet M.D on 09/24/2018 at 8:02 AM  To page go to [email or web address] - password [place]  [facility] -  Office  [phone]  See all Orders from today for further details  Admit date - 09/09/2018    15    Objective:   Vitals:   09/24/18 0300 09/24/18 0500 09/24/18 0600 09/24/18 0758  BP:  (!) 146/74 (!) 163/95 (!) 113/51  Pulse: 85 86 91 94  Resp: (!) 22 (!) 24 (!) 24 (!) 45  Temp:      TempSrc:      SpO2: 100% 95% 95% 95%  Weight:      Height:        Wt Readings from Last 3 Encounters:  09/23/18 75.1 kg  06/21/17 70.3 kg  01/24/16 67.1 kg     Intake/Output Summary (Last 24 hours) at 09/24/2018 0802 Last data filed at 09/24/2018 0600 Gross per 24 hour  Intake 1446.28 ml  Output 2250 ml  Net -803.72 ml     Physical Exam Gen Exam: Intubated-lethargic-but awakes and opens eyes.  Occasionally follows HEENT: Atraumatic normocephalic Chest: Clear to auscultation anteriorly CVS: S1-S2 regular  Abdomen: Soft-nontender nondistended Extremities: No edema Neurology: Difficult examination-but seems to move all 4 extremities while on CPAP trial  Skin: No rash   Data Review:    CBC Recent Labs  Lab 09/20/18 0525  09/21/18 0433 09/22/18 0254 09/22/18 0520 09/22/18 0615 09/23/18 0430  WBC 13.1*  --  13.1* 12.4*  --  18.4* 12.5*  HGB 9.2*   < > 9.4* 9.2* 10.5* 9.7* 7.9*  HCT 31.0*   < > 31.2* 30.4* 31.0* 32.7* 26.4*  PLT 290  --  348 295  --  353 310  MCV 100.0  --  99.0 98.1  --  98.5 98.1  MCH 29.7  --  29.8 29.7  --  29.2 29.4  MCHC 29.7*  --  30.1 30.3  --  29.7* 29.9*  RDW 14.5  --  14.5 14.4  --  14.6 14.9  LYMPHSABS 1.2  --  1.1 1.0  --  1.1 0.9  MONOABS 1.0  --  0.9 0.8  --  1.1* 1.0  EOSABS 0.3  --  0.3 0.3  --  0.4 0.3  BASOSABS 0.0  --  0.0 0.0  --  0.1 0.0   < > = values in this interval not displayed.    Chemistries  Recent Labs  Lab 09/19/18 0400 09/20/18 0525 09/20/18 1041   09/21/18 0433 09/22/18 0520 09/22/18 0615 09/23/18 0430 09/24/18 0500  NA 148* 146*  --    < > 143 139 141 145 142  K 4.2 4.5  --    < > 4.6 5.2* 4.9 4.1 4.4  CL 104 101  --   --  99  --  98 99 99  CO2 34* 36*  --   --  32  --  31 36* 33*  GLUCOSE 216* 134*  --   --  109*  --  187* 60* 123*  BUN 127* 119*  --   --  111*  --  100* 93* 90*  CREATININE 1.98* 1.81*  --   --  1.62*  --  1.83* 1.67* 1.63*  CALCIUM 10.2 10.4*  --   --  10.4*  --  10.1 10.0 9.6  MG  --  2.3  --   --  2.3  --  2.2 2.1  --   AST 22  --  22  --   --   --   --  37  --   ALT 13  --  12  --   --   --   --  21  --   ALKPHOS 36*  --  39  --   --   --   --  35*  --   BILITOT 0.4  --  0.4  --   --   --   --  0.5  --    < > = values in this interval not displayed.   ------------------------------------------------------------------------------------------------------------------ Recent Labs    09/22/18 0254  TRIG 76    Lab Results  Component Value Date   HGBA1C 7.3 (H) 04/19/2012   ------------------------------------------------------------------------------------------------------------------ No results for input(s): TSH, T4TOTAL, T3FREE, THYROIDAB in the last 72 hours.  Invalid input(s): FREET3 ------------------------------------------------------------------------------------------------------------------ Recent Labs    09/22/18 0254  FERRITIN 606*    Coagulation profile No results for input(s): INR, PROTIME in the last 168 hours.  Recent Labs    09/22/18 0254  DDIMER 1.03*    Cardiac Enzymes Recent Labs  Lab 09/20/18 1041 09/20/18 2222 09/21/18 0433  TROPONINI 0.03* <0.03 0.04*   ------------------------------------------------------------------------------------------------------------------    Component Value Date/Time   BNP 90.7 09/22/2018 1600    Micro Results Recent Results (from the past 240 hour(s))  Culture, Urine     Status: Abnormal   Collection Time: 09/22/18  4:00  PM  Result Value Ref Range Status   Specimen Description URINE, CATHETERIZED  Final   Special Requests   Final    Normal Performed at [facility], [place] [address], [place], [place] [address]    Culture (A)  Final    >=100,000 COLONIES/mL ENTEROCOCCUS FAECALIS 10,000 COLONIES/mL CITROBACTER FREUNDII    Report Status 09/24/2018 FINAL  Final   Organism ID, Bacteria ENTEROCOCCUS FAECALIS (A)  Final   Organism ID, Bacteria CITROBACTER FREUNDII (A)  Final      Susceptibility   Citrobacter freundii - MIC*    CEFAZOLIN >=64 RESISTANT Resistant     CEFTRIAXONE <=1 SENSITIVE Sensitive     CIPROFLOXACIN <=0.25 SENSITIVE Sensitive     GENTAMICIN <=1 SENSITIVE Sensitive     IMIPENEM <=0.25 SENSITIVE Sensitive     NITROFURANTOIN <=16 SENSITIVE Sensitive     TRIMETH/SULFA <=20 SENSITIVE Sensitive     PIP/TAZO <=4 SENSITIVE Sensitive     * 10,000 COLONIES/mL CITROBACTER FREUNDII   Enterococcus faecalis - MIC*    AMPICILLIN <=2 SENSITIVE Sensitive     LEVOFLOXACIN 2 SENSITIVE Sensitive     NITROFURANTOIN <=16 SENSITIVE Sensitive     VANCOMYCIN 2 SENSITIVE Sensitive     * >=100,000 COLONIES/mL ENTEROCOCCUS FAECALIS  Culture, respiratory (non-expectorated)     Status: None (Preliminary result)   Collection Time: 09/22/18  4:00 PM  Result Value Ref Range Status   Specimen Description TRACHEAL ASPIRATE  Final   Special Requests Normal  Final   Gram Stain   Final    MODERATE WBC PRESENT,BOTH PMN AND MONONUCLEAR MODERATE GRAM POSITIVE COCCI IN PAIRS IN CLUSTERS MODERATE GRAM NEGATIVE RODS    Culture   Final    CULTURE REINCUBATED FOR BETTER GROWTH Performed at [facility],  [address] [place], [place] [address]    Report Status PENDING  Incomplete  Culture, blood (routine x 2)     Status: None (Preliminary result)   Collection Time: 09/22/18  4:13 PM  Result Value Ref Range Status   Specimen Description BLOOD LEFT ANTECUBITAL  Final   Special Requests   Final    BOTTLES  DRAWN AEROBIC ONLY Blood Culture adequate volume   Culture   Final    NO GROWTH < 24 HOURS Performed at [facility], [place] [address], [place], [place] [address]    Report Status PENDING  Incomplete  Culture, blood (routine x 2)     Status: None (Preliminary result)   Collection Time: 09/22/18  6:25 PM  Result Value Ref Range Status   Specimen Description BLOOD LEFT HAND  Final   Special Requests   Final    BOTTLES DRAWN AEROBIC AND ANAEROBIC Blood Culture adequate volume   Culture   Final    NO GROWTH < 24 HOURS Performed at [facility], [place] [address], [place], [place] [address]    Report Status PENDING  Incomplete    Radiology Reports Dg Chest Port 1 View  Result Date: 09/24/2018 CLINICAL DATA:  Short of breath EXAM: PORTABLE CHEST 1 VIEW COMPARISON:  09/23/2018 FINDINGS: Normal heart size. Endotracheal tube, NG tube, and right PICC are stable. Patchy airspace opacities throughout both lungs are not significantly changed. No pneumothorax. IMPRESSION: Stable patchy airspace disease throughout both lungs. Electronically Signed   By: Marybelle Killings M.D.   On: 09/24/2018 07:41   Dg Chest Port 1 View  Result Date: 09/23/2018 CLINICAL DATA:  Respiratory failure. EXAM: PORTABLE CHEST 1 VIEW COMPARISON:  One-view chest x-ray 09/21/2018 FINDINGS: The heart size is normal. Endotracheal tube is stable. NG tube courses off the inferior border the film. Right-sided PICC line is stable and in satisfactory position. Bilateral interstitial and airspace disease is similar the prior exam. There is improved aeration at the left base. Increased airspace opacities are noted in the right middle and upper lobes. IMPRESSION: 1. Bilateral interstitial and airspace disease concerning for infection. There is slight increase on the right and some improvement on the left. 2. Support apparatus is stable. Electronically Signed   By: San Morelle M.D.   On: 09/23/2018 07:41   Dg Chest Port 1  View  Result Date: 09/21/2018 CLINICAL DATA:  Respiratory failure. EXAM: PORTABLE CHEST 1 VIEW COMPARISON:  Radiograph of September 21, 2018. FINDINGS: Stable cardiomediastinal silhouette. Endotracheal and nasogastric tubes are in grossly good position. Right-sided PICC line is unchanged. No pneumothorax or significant pleural effusion is noted. Stable bilateral lung opacities are noted concerning for pneumonia or possibly edema. Bony thorax is unremarkable. IMPRESSION: Stable support apparatus. Stable bilateral lung opacities as described above. Electronically Signed   By: Marijo Conception, M.D.   On: 09/21/2018 22:03   Dg Chest Port 1 View  Result Date: 09/21/2018 CLINICAL DATA:  Respiratory failure EXAM: PORTABLE CHEST 1 VIEW COMPARISON:  09/20/2018 FINDINGS: Endotracheal tube in good position. Right arm PICC tip in the proximal SVC unchanged. NG tube in the stomach. Mild improvement in bilateral airspace disease. Small left effusion. IMPRESSION: Endotracheal tube in good position Diffuse bilateral airspace disease with mild improvement. Electronically Signed   By: Franchot Gallo M.D.   On: 09/21/2018 08:10   Dg Chest Port 1 View  Result Date: 09/20/2018 CLINICAL DATA:  Respiratory failure.  Ventilator support. EXAM: PORTABLE CHEST 1 VIEW  COMPARISON:  09/18/2018 FINDINGS: Endotracheal tube tip is 4 cm above the carina. Orogastric tube enters the abdomen. Bilateral patchy pulmonary infiltrates appear quite similar, with slightly more volume loss at the right lung base. No new finding otherwise. IMPRESSION: Persistent patchy bilateral pulmonary infiltrates. Slight worsening of volume loss at the right lung base. Electronically Signed   By: Nelson Chimes M.D.   On: 09/20/2018 06:11   Dg Chest Port 1 View  Result Date: 09/18/2018 CLINICAL DATA:  ARDS, COVID+ Pt intubated on airborne and contact PPE: Gloves, gown, N95, goggles, bouffant. EXAM: PORTABLE CHEST - 1 VIEW COMPARISON:  09/16/2018 FINDINGS:  Endotracheal tube, nasogastric tube, right arm PICC stable. Slight improvement in right lower lung airspace disease with patchy hazy infiltrates in the left mid and lower lung and right upper lung as before. Heart size and mediastinal contours are within normal limits. No effusion. No pneumothorax. Fixation hardware in the proximal right humerus. IMPRESSION: 1. Patchy bilateral airspace disease, with slight improvement in right lower lung opacities since prior study. 2. Support hardware stable in position. Electronically Signed   By: Lucrezia Europe M.D.   On: 09/18/2018 08:05   Dg Chest Port 1 View  Result Date: 09/16/2018 CLINICAL DATA:  Respiratory failure EXAM: PORTABLE CHEST 1 VIEW COMPARISON:  Yesterday FINDINGS: Endotracheal tube tip between the clavicular heads and carina. The orogastric tube at least reaches the stomach. Right-sided central line with tip at the SVC. No appreciable change in patchy bilateral interstitial and airspace opacity. Stable heart size. No evidence of air leak or pleural fluid. IMPRESSION: 1. Unchanged multifocal pneumonia. 2. Stable hardware positioning. Electronically Signed   By: Monte Fantasia M.D.   On: 09/16/2018 07:09   Dg Chest Port 1 View  Result Date: 09/15/2018 CLINICAL DATA:  Respiratory failure. Intubated patient. Follow-up exam. EXAM: PORTABLE CHEST 1 VIEW COMPARISON:  Prior studies, most recent 09/14/2018 FINDINGS: Cardiac silhouette is normal size.  No mediastinal or hilar masses. There are hazy bilateral airspace lung opacities which are without change from the previous day's exam. No new lung abnormalities. No convincing pleural effusion and no pneumothorax. Endotracheal tube, nasal/orogastric tube and right PICC are stable. IMPRESSION: 1. No change from previous day's study. 2. Persistent bilateral ground-glass lung opacities. 3. Stable well-positioned support apparatus. Electronically Signed   By: Lajean Manes M.D.   On: 09/15/2018 06:47   Dg Chest Port 1  View  Result Date: 09/14/2018 CLINICAL DATA:  Respiratory failure.  COVID-19. EXAM: PORTABLE CHEST 1 VIEW COMPARISON:  09/13/2018 and prior studies dating back to 09/09/2018. FINDINGS: Bilateral ground-glass lung opacities are without significant change from the prior exams. Atelectasis at the left lung base is stable. More confluent opacity at the right lung base is less prominent, which appears to be due to larger lung volumes on the current exam. No new lung abnormalities.  No pleural effusion or pneumothorax. Endotracheal tube, nasal/orogastric tube and right PICC are stable in well positioned. IMPRESSION: 1. No significant change from the previous day's study. 2. Persistent bilateral ground-glass lung opacities more confluent basilar opacities. 3. Stable well-positioned support apparatus. Electronically Signed   By: Lajean Manes M.D.   On: 09/14/2018 06:24   Dg Chest Port 1 View  Result Date: 09/13/2018 CLINICAL DATA:  Respiratory failure EXAM: PORTABLE CHEST 1 VIEW COMPARISON:  Yesterday FINDINGS: Endotracheal tube tip between the clavicular heads and carina. The orogastric tube has been advanced and now at least reaches the stomach-as confirmed on interval KUB. Right PICC with tip  at the [facility]. Patchy bilateral airspace disease. Borderline heart size. No effusion or pneumothorax. IMPRESSION: Stable hardware positioning and bilateral pneumonia. Electronically Signed   By: Monte Fantasia M.D.   On: 09/13/2018 06:08   Dg Chest Port 1 View  Result Date: 09/12/2018 CLINICAL DATA:  History of endotracheal tube EXAM: PORTABLE CHEST 1 VIEW COMPARISON:  Yesterday FINDINGS: Endotracheal tube tip between the clavicular heads and carina. The orogastric tube tip is in the region of the GE junction. Right PICC with tip near the SVC origin. Patchy bilateral lung opacity with both interstitial and airspace appearance. No effusion or pneumothorax. Normal heart size. These results will be called to the ordering  clinician or representative by the Radiologist Assistant, and communication documented in the PACS or zVision Dashboard. IMPRESSION: 1. Shortened orogastric tube with tip now at the GE junction. 2. Other hardware positioning is stable. 3. Bilateral lung opacity with progression. Electronically Signed   By: Monte Fantasia M.D.   On: 09/12/2018 07:14   Dg Chest Port 1 View  Result Date: 09/11/2018 CLINICAL DATA:  Respiratory failure EXAM: PORTABLE CHEST 1 VIEW COMPARISON:  09/10/2018 FINDINGS: Endotracheal tube and NG tube are stable. Interval placement of right PICC line with the tip in the SVC. Patchy bilateral airspace disease similar to prior study. Heart is normal size. No visible effusions or acute bony abnormality. IMPRESSION: Right PICC line tip in the SVC. Remainder of the support devices stable. Stable patchy bilateral airspace disease. Electronically Signed   By: Rolm Baptise M.D.   On: 09/11/2018 07:36   Dg Chest Port 1 View  Result Date: 09/10/2018 CLINICAL DATA:  Respiratory failure EXAM: PORTABLE CHEST 1 VIEW COMPARISON:  09/09/2018 FINDINGS: Support devices are stable. Heart is normal size. Patchy bilateral airspace disease again noted, not significantly changed. No visible effusions or acute bony abnormality. IMPRESSION: Patchy bilateral airspace disease, not significantly changed. Electronically Signed   By: Rolm Baptise M.D.   On: 09/10/2018 07:39   Dg Chest Port 1 View  Result Date: 09/09/2018 CLINICAL DATA:  Respiratory failure. EXAM: PORTABLE CHEST 1 VIEW COMPARISON:  Chest x-ray from same day at 1 a.m. FINDINGS: Interval placement of an endotracheal tube with the tip 3.9 cm above the carina. Enteric tube entering the stomach with the tip below the field of view. The heart size and mediastinal contours are within normal limits. Patchy asymmetric, hazy opacities in both lungs again noted, slightly more consolidative appearance in the left mid lung and right lower lobe. No pleural  effusion or pneumothorax. No acute osseous abnormality. IMPRESSION: 1. Appropriately positioned endotracheal tube. 2. Patchy asymmetric airspace disease in both lungs again noted, with slightly more focal consolidative appearance in the left mid lung and right lower lobe. Findings are nonspecific, but concerning for atypical infection, including potential viral pneumonia. Electronically Signed   By: Titus Dubin M.D.   On: 09/09/2018 17:38   Dg Chest Port 1 View  Result Date: 09/09/2018 CLINICAL DATA:  Short of breath, cough.  Diarrhea. EXAM: PORTABLE CHEST 1 VIEW COMPARISON:  Chest radiograph 05/29/2012 FINDINGS: Normal cardiac silhouette. There is bilateral fine airspace disease in the upper lobes. No pleural fluid. No pneumothorax. No acute osseous abnormality. RIGHT shoulder internal fixation. IMPRESSION: Bilateral upper lobe airspace disease representing multifocal pneumonia versus pulmonary edema. Electronically Signed   By: Suzy Bouchard M.D.   On: 09/09/2018 11:27   Dg Abd Portable 1v  Result Date: 09/12/2018 CLINICAL DATA:  Orogastric tube placement. EXAM: PORTABLE ABDOMEN - 1  VIEW COMPARISON:  CT abdomen and pelvis June 24, 2018 FINDINGS: Nasogastric tube tip projects in gastric antrum. No intra-abdominal mass effect or pathologic calcifications. Included bowel gas pattern is nondilated and nonobstructive. Mild vascular calcifications. Interstitial and alveolar airspace opacities included lung bases better characterized on today's dedicated radiograph. Thoracolumbar levoscoliosis. IMPRESSION: 1. Nasogastric tube tip projects in gastric antrum. Electronically Signed   By: Elon Alas M.D.   On: 09/12/2018 22:13   [place] Ekg Site Rite  Result Date: 09/10/2018 If Site Rite image not attached, placement could not be confirmed due to current cardiac rhythm.

## 2018-09-24 NOTE — Progress Notes (Signed)
NAME:  Sabrina Baker, MRN:  [id number], DOB:  Dec 19, 1940, LOS: 72 ADMISSION DATE:  09/09/2018, CONSULTATION DATE:  09/09/18 REFERRING MD: Regenia Skeeter, CHIEF COMPLAINT:  SOB, diarrhea    Brief History   78 y/o F admitted 3/30 from home with c/o of cough, SOB, and abdominal pain x 2 weeks. Progressive hypoxia in ER now on NRB and CXR with bilateral infiltrates. Intubated.  COVID Positive.     Significant Hospital Events   3/30 Admitted, intubated 4/08 PSV 4 hours failed SBT due to agitation, CXR with improved aeration  4/09 Failed wean due to agitation 4/10 Hypotensive despite decreased sedation, levophed initiated 4/11 Weaned 4 hours 4/12 PSV 10/8  Consults:  Palliative care 4/5  Procedures:  3/30 ETT >>  RUE PICC 3/31 >>  ALine 4/10 >> out  Significant Diagnostic Tests:      Micro Data:  3/30 BC x 2 > negative 3/30 RVP > neg 3/30 COVID-19 >> positive 3/30 Trach asp >> normal flora 3/30 urine strep >> negative  3/30 urine legionella >> negative  Antimicrobials:  3/30 azithro >> completed  3/30 ceftriaxone >> completed 09/10/2018 Plaquenil >> completed  Interim history/subjective:  Diuresed well yesterday, this morning had hypoxemia and anxiety on spontaneous breathing trial. Weaned on ventilator some yesterday  Objective   Blood pressure (!) 113/51, pulse 94, temperature 97.8 F (36.6 C), temperature source Axillary, resp. rate (!) 45, height 5\' 3"  (1.6 m), weight 75.1 kg, SpO2 95 %.    Vent Mode: PSV;CPAP FiO2 (%):  [40 %] 40 % Set Rate:  [22 bmp] 22 bmp Vt Set:  [310 mL] 310 mL PEEP:  [5 cmH20-8 cmH20] 5 cmH20 Pressure Support:  [10 cmH20-15 cmH20] 10 cmH20 Plateau Pressure:  [7 cmH20-10 cmH20] 7 cmH20   Intake/Output Summary (Last 24 hours) at 09/24/2018 1018 Last data filed at 09/24/2018 0600 Gross per 24 hour  Intake 1446.28 ml  Output 2250 ml  Net -803.72 ml    Physical Exam  General:  In bed on vent HENT: NCAT ETT in place PULM: CTA B, vent  supported breathing CV: RRR, no mgr GI: BS+, soft, nontender MSK: normal bulk and tone Neuro: sedated on vent    Resolved Hospital Problem list     Assessment & Plan:   Acute Hypoxemic Respiratory Failure secondary to SARS-CoV-2 ARDS : Oxygenation improved, chest x-ray about the same P:  Pressure support as tolerated today Decrease PEEP to 5 Monitor O2 saturation, keep above 88% by titrating FiO2 and PEEP Ventilator associated pneumonia prevention protocol Continue diuresis  Agitated Delirium significant anxiety Sedation Needs on Mechanical Ventilation  P: RA SS goal 0 to -1 Increase clonazepam to 1 mg Continue valproic acid Consider Seroquel Continue Precedex titrated per PAD protocol     Best practice:  Diet: NPO Pain/Anxiety/Delirium protocol (if indicated): in place  VAP protocol (if indicated): Yes DVT prophylaxis: Eliquis   GI prophylaxis: protonix  Glucose control: Lantus, SSI  Mobility: bedrest Code Status: full Family Communication: Daughter called for update 4/13, no answer.  Son called,  Disposition: ICU   Labs   CBC: Recent Labs  Lab 09/20/18 0525  09/21/18 0433 09/22/18 0254 09/22/18 0520 09/22/18 0615 09/23/18 0430  WBC 13.1*  --  13.1* 12.4*  --  18.4* 12.5*  NEUTROABS 10.5*  --  10.7* 10.3*  --  15.7* 10.2*  HGB 9.2*   < > 9.4* 9.2* 10.5* 9.7* 7.9*  HCT 31.0*   < > 31.2* 30.4* 31.0* 32.7* 26.4*  MCV 100.0  --  99.0 98.1  --  98.5 98.1  PLT 290  --  348 295  --  353 310   < > = values in this interval not displayed.    Basic Metabolic Panel: Recent Labs  Lab 09/20/18 0525  09/21/18 0433 09/22/18 0254 09/22/18 0520 09/22/18 0615 09/23/18 0430 09/24/18 0500  NA 146*   < > 143  --  139 141 145 142  K 4.5   < > 4.6  --  5.2* 4.9 4.1 4.4  CL 101  --  99  --   --  98 99 99  CO2 36*  --  32  --   --  31 36* 33*  GLUCOSE 134*  --  109*  --   --  187* 60* 123*  BUN 119*  --  111*  --   --  100* 93* 90*  CREATININE 1.81*  --  1.62*   --   --  1.83* 1.67* 1.63*  CALCIUM 10.4*  --  10.4*  --   --  10.1 10.0 9.6  MG 2.3  --  2.3  --   --  2.2 2.1  --   PHOS 5.8*  --  6.3* 6.3*  --  5.8* 5.5*  --    < > = values in this interval not displayed.   GFR: Estimated Creatinine Clearance: 28.1 mL/min (A) (by C-G formula based on SCr of 1.63 mg/dL (H)). Recent Labs  Lab 09/19/18 0400  09/21/18 0433 09/22/18 0254 09/22/18 0615 09/23/18 0430  PROCALCITON 0.35  --   --   --   --  0.55  WBC 17.4*   < > 13.1* 12.4* 18.4* 12.5*   < > = values in this interval not displayed.    Liver Function Tests: Recent Labs  Lab 09/19/18 0400 09/20/18 1041 09/23/18 0430  AST 22 22 37  ALT 13 12 21   ALKPHOS 36* 39 35*  BILITOT 0.4 0.4 0.5  PROT 5.5* 5.4* 5.3*  ALBUMIN 1.6* 1.7* 1.6*   No results for input(s): LIPASE, AMYLASE in the last 168 hours. No results for input(s): AMMONIA in the last 168 hours.  ABG    Component Value Date/Time   PHART 7.478 (H) 09/22/2018 0520   PCO2ART 47.7 09/22/2018 0520   PO2ART 57.0 (L) 09/22/2018 0520   HCO3 35.2 (H) 09/22/2018 0520   TCO2 37 (H) 09/22/2018 0520   ACIDBASEDEF 2.0 09/11/2018 0401   O2SAT 90.0 09/22/2018 0520     Coagulation Profile: No results for input(s): INR, PROTIME in the last 168 hours.  Cardiac Enzymes: Recent Labs  Lab 09/20/18 1041 09/20/18 2222 09/21/18 0433 09/22/18 0254  CKTOTAL  --   --   --  11*  TROPONINI 0.03* <0.03 0.04*  --     HbA1C: Hgb A1c MFr Bld  Date/Time Value Ref Range Status  04/19/2012 03:43 PM 7.3 (H) <5.7 % Final    Comment:    (NOTE)                                                                       According to the ADA Clinical Practice Recommendations for 2011, when HbA1c is used as a screening test:  >=6.5%  Diagnostic of Diabetes Mellitus           (if abnormal result is confirmed) 5.7-6.4%   Increased risk of developing Diabetes Mellitus References:Diagnosis and Classification of Diabetes Mellitus,Diabetes  Care,2011,34(Suppl 1):S62-S69 and Standards of Medical Care in         Diabetes - 2011,Diabetes NIOE,7035,00 (Suppl 1):S11-S61.  04/18/2012 09:20 AM 7.3 (H) <5.7 % Final    Comment:    (NOTE)                                                                       According to the ADA Clinical Practice Recommendations for 2011, when HbA1c is used as a screening test:  >=6.5%   Diagnostic of Diabetes Mellitus           (if abnormal result is confirmed) 5.7-6.4%   Increased risk of developing Diabetes Mellitus References:Diagnosis and Classification of Diabetes Mellitus,Diabetes XFGH,8299,37(JIRCV 1):S62-S69 and Standards of Medical Care in         Diabetes - 2011,Diabetes Care,2011,34 (Suppl 1):S11-S61.    CBG: Recent Labs  Lab 09/23/18 1620 09/23/18 1957 09/23/18 2329 09/24/18 0311 09/24/18 0754  GLUCAP 117* 120* 115* 126* 141*      Critical care time: 30 minutes      This patient is critically ill with multiple organ system failure; which, requires frequent high complexity decision making, assessment, support, evaluation, and titration of therapies. This was completed through the application of advanced monitoring technologies and extensive interpretation of multiple databases. During this encounter critical care time was devoted to patient care services described in this note for 30 minutes.  Roselie Awkward, MD [facility] Pager: [phone] Cell: [phone] If no response, call [phone]

## 2018-09-24 NOTE — Progress Notes (Signed)
Patient alert and follows commands. BP elevated pt given metoprolol. Agitated at times precedex increased to 0.5 mcg/ hr. Patient experiencing weeping on left arm.

## 2018-09-24 NOTE — Progress Notes (Signed)
   09/24/18 1400  Clinical Encounter Type  Visited With Family  Visit Type Initial;Psychological support;Spiritual support  Referral From Nurse  Consult/Referral To Chaplain  Spiritual Encounters  Spiritual Needs Emotional;Other (Comment) (Spiritual Care Conversation/Support)  Stress Factors  Family Stress Factors Health changes;Major life changes   I spoke with the patient's daughter, Arrie Aran. The family has good support and are appreciative of all of the care from the medical team. She was appreciative of the support from [place] [facility] considering she cannot be with her mother here in the hospital.    Please, contact Spiritual Care for further assistance.   Chaplain Shanon Ace M.Div., [facility]

## 2018-09-24 NOTE — Care Management (Signed)
Case manager is following for disposition when medical condition improves. May God Bless her.    Ricki Miller, RN Case Manager [phone]

## 2018-09-25 ENCOUNTER — Inpatient Hospital Stay (HOSPITAL_COMMUNITY): Payer: Medicare Other

## 2018-09-25 LAB — BASIC METABOLIC PANEL
Anion gap: 9 (ref 5–15)
BUN: 86 mg/dL — ABNORMAL HIGH (ref 8–23)
CO2: 35 mmol/L — ABNORMAL HIGH (ref 22–32)
Calcium: 9.1 mg/dL (ref 8.9–10.3)
Chloride: 99 mmol/L (ref 98–111)
Creatinine, Ser: 1.71 mg/dL — ABNORMAL HIGH (ref 0.44–1.00)
GFR calc Af Amer: 33 mL/min — ABNORMAL LOW (ref >60)
GFR calc non Af Amer: 28 mL/min — ABNORMAL LOW (ref >60)
Glucose, Bld: 156 mg/dL — ABNORMAL HIGH (ref 70–99)
Potassium: 4.4 mmol/L (ref 3.5–5.1)
Sodium: 143 mmol/L (ref 135–145)

## 2018-09-25 LAB — POCT I-STAT 7, (LYTES, BLD GAS, ICA,H+H)
Acid-Base Excess: 14 mmol/L — ABNORMAL HIGH (ref 0.0–2.0)
Acid-Base Excess: 13 mmol/L — ABNORMAL HIGH (ref 0.0–2.0)
Bicarbonate: 38.3 mmol/L — ABNORMAL HIGH (ref 20.0–28.0)
Bicarbonate: 38.5 mmol/L — ABNORMAL HIGH (ref 20.0–28.0)
Calcium, Ion: 1.29 mmol/L (ref 1.15–1.40)
Calcium, Ion: 1.27 mmol/L (ref 1.15–1.40)
HCT: 23 % — ABNORMAL LOW (ref 36.0–46.0)
HCT: 27 % — ABNORMAL LOW (ref 36.0–46.0)
Hemoglobin: 7.8 g/dL — ABNORMAL LOW (ref 12.0–15.0)
Hemoglobin: 9.2 g/dL — ABNORMAL LOW (ref 12.0–15.0)
O2 Saturation: 93 %
O2 Saturation: 93 %
Patient temperature: 98.6
Patient temperature: 98.6
Potassium: 4.5 mmol/L (ref 3.5–5.1)
Potassium: 4.1 mmol/L (ref 3.5–5.1)
Sodium: 140 mmol/L (ref 135–145)
Sodium: 142 mmol/L (ref 135–145)
TCO2: 40 mmol/L — ABNORMAL HIGH (ref 22–32)
TCO2: 40 mmol/L — ABNORMAL HIGH (ref 22–32)
pCO2 arterial: 45.5 mmHg (ref 32.0–48.0)
pCO2 arterial: 55.7 mmHg — ABNORMAL HIGH (ref 32.0–48.0)
pH, Arterial: 7.533 — ABNORMAL HIGH (ref 7.350–7.450)
pH, Arterial: 7.447 (ref 7.350–7.450)
pO2, Arterial: 59 mmHg — ABNORMAL LOW (ref 83.0–108.0)
pO2, Arterial: 67 mmHg — ABNORMAL LOW (ref 83.0–108.0)

## 2018-09-25 LAB — URINALYSIS, ROUTINE W REFLEX MICROSCOPIC
Bilirubin Urine: NEGATIVE
Glucose, UA: NEGATIVE mg/dL
Hgb urine dipstick: NEGATIVE
Ketones, ur: NEGATIVE mg/dL
Nitrite: NEGATIVE
Protein, ur: NEGATIVE mg/dL
Specific Gravity, Urine: 1.009 (ref 1.005–1.030)
WBC, UA: >50 WBC/hpf — ABNORMAL HIGH (ref 0–5)
pH: 5 (ref 5.0–8.0)

## 2018-09-25 LAB — CULTURE, RESPIRATORY W GRAM STAIN

## 2018-09-25 LAB — BRAIN NATRIURETIC PEPTIDE: B Natriuretic Peptide: 278.8 pg/mL — ABNORMAL HIGH (ref 0.0–100.0)

## 2018-09-25 LAB — GLUCOSE, CAPILLARY
Glucose-Capillary: 157 mg/dL — ABNORMAL HIGH (ref 70–99)
Glucose-Capillary: 169 mg/dL — ABNORMAL HIGH (ref 70–99)
Glucose-Capillary: 177 mg/dL — ABNORMAL HIGH (ref 70–99)
Glucose-Capillary: 202 mg/dL — ABNORMAL HIGH (ref 70–99)
Glucose-Capillary: 206 mg/dL — ABNORMAL HIGH (ref 70–99)
Glucose-Capillary: 57 mg/dL — ABNORMAL LOW (ref 70–99)
Glucose-Capillary: 89 mg/dL (ref 70–99)
Glucose-Capillary: 93 mg/dL (ref 70–99)

## 2018-09-25 LAB — CULTURE, RESPIRATORY: Special Requests: NORMAL

## 2018-09-25 LAB — PROCALCITONIN: Procalcitonin: 0.56 ng/mL

## 2018-09-25 MED ORDER — MIDAZOLAM HCL 2 MG/2ML IJ SOLN
1.0000 mg | INTRAMUSCULAR | Status: DC | PRN
  Administered 2018-09-25: 1 mg via INTRAVENOUS

## 2018-09-25 MED ORDER — DEXMEDETOMIDINE HCL IN NACL 400 MCG/100ML IV SOLN
0.4000 ug/kg/h | INTRAVENOUS | Status: DC
  Administered 2018-09-25: 0.6 ug/kg/h via INTRAVENOUS
  Administered 2018-09-26: 1.2 ug/kg/h via INTRAVENOUS
  Administered 2018-09-26: 0.8 ug/kg/h via INTRAVENOUS
  Administered 2018-09-26: 1 ug/kg/h via INTRAVENOUS
  Administered 2018-09-26: 1.1 ug/kg/h via INTRAVENOUS
  Administered 2018-09-27: 1.198 ug/kg/h via INTRAVENOUS
  Administered 2018-09-27: 0.9 ug/kg/h via INTRAVENOUS
  Administered 2018-09-27: 1.198 ug/kg/h via INTRAVENOUS
  Administered 2018-09-27: 1.2 ug/kg/h via INTRAVENOUS
  Administered 2018-09-28: 1.198 ug/kg/h via INTRAVENOUS
  Administered 2018-09-28: 1 ug/kg/h via INTRAVENOUS
  Administered 2018-09-28: 1.198 ug/kg/h via INTRAVENOUS
  Administered 2018-09-28: 1.103 ug/kg/h via INTRAVENOUS
  Administered 2018-09-28 – 2018-09-29 (×2): 1 ug/kg/h via INTRAVENOUS
  Administered 2018-09-29: 1.2 ug/kg/h via INTRAVENOUS
  Administered 2018-09-29: 1.1 ug/kg/h via INTRAVENOUS
  Administered 2018-09-29: 1 ug/kg/h via INTRAVENOUS
  Administered 2018-09-29 – 2018-09-30 (×5): 1.198 ug/kg/h via INTRAVENOUS
  Administered 2018-09-30 (×2): 1.2 ug/kg/h via INTRAVENOUS
  Administered 2018-10-01 (×2): 1.198 ug/kg/h via INTRAVENOUS
  Filled 2018-09-25 (×28): qty 100

## 2018-09-25 MED ORDER — ETOMIDATE 2 MG/ML IV SOLN
INTRAVENOUS | Status: AC
  Filled 2018-09-25: qty 10

## 2018-09-25 MED ORDER — ROCURONIUM BROMIDE 50 MG/5ML IV SOSY
PREFILLED_SYRINGE | INTRAVENOUS | Status: AC
  Filled 2018-09-25: qty 10

## 2018-09-25 MED ORDER — FENTANYL CITRATE (PF) 100 MCG/2ML IJ SOLN
25.0000 ug | INTRAMUSCULAR | Status: DC | PRN
  Administered 2018-09-25 – 2018-09-26 (×3): 100 ug via INTRAVENOUS
  Administered 2018-09-26 (×2): 50 ug via INTRAVENOUS
  Administered 2018-09-26: 100 ug via INTRAVENOUS
  Administered 2018-09-26: 10:00:00 50 ug via INTRAVENOUS
  Administered 2018-09-27 – 2018-09-28 (×8): 100 ug via INTRAVENOUS
  Administered 2018-09-29: 50 ug via INTRAVENOUS
  Administered 2018-09-29: 100 ug via INTRAVENOUS
  Administered 2018-09-29: 75 ug via INTRAVENOUS
  Administered 2018-09-29 (×2): 50 ug via INTRAVENOUS
  Administered 2018-09-30: 100 ug via INTRAVENOUS
  Filled 2018-09-25 (×21): qty 2

## 2018-09-25 MED ORDER — ROCURONIUM BROMIDE 50 MG/5ML IV SOLN
50.0000 mg | Freq: Once | INTRAVENOUS | Status: AC
  Administered 2018-09-25: 50 mg via INTRAVENOUS

## 2018-09-25 MED ORDER — DEXAMETHASONE SODIUM PHOSPHATE 10 MG/ML IJ SOLN
4.0000 mg | Freq: Four times a day (QID) | INTRAMUSCULAR | Status: AC
  Administered 2018-09-25 – 2018-09-26 (×4): 4 mg via INTRAVENOUS
  Filled 2018-09-25 (×4): qty 1

## 2018-09-25 MED ORDER — FENTANYL CITRATE (PF) 100 MCG/2ML IJ SOLN
100.0000 ug | Freq: Once | INTRAMUSCULAR | Status: AC
  Administered 2018-09-25: 100 ug via INTRAVENOUS

## 2018-09-25 MED ORDER — MIDAZOLAM HCL 2 MG/2ML IJ SOLN
2.0000 mg | Freq: Once | INTRAMUSCULAR | Status: AC
  Administered 2018-09-25: 2 mg via INTRAVENOUS

## 2018-09-25 MED ORDER — SUCCINYLCHOLINE CHLORIDE 200 MG/10ML IV SOSY
PREFILLED_SYRINGE | INTRAVENOUS | Status: AC
  Filled 2018-09-25: qty 10

## 2018-09-25 MED ORDER — ROCURONIUM BROMIDE 50 MG/5ML IV SOSY: 60.0000 mg | PREFILLED_SYRINGE | Freq: Once | INTRAVENOUS | Status: DC

## 2018-09-25 MED ORDER — DEXTROSE 50 % IV SOLN
INTRAVENOUS | Status: AC
  Administered 2018-09-25: 25 mL
  Filled 2018-09-25: qty 50

## 2018-09-25 MED ORDER — STERILE WATER FOR INJECTION IJ SOLN
INTRAMUSCULAR | Status: AC
  Filled 2018-09-25: qty 10

## 2018-09-25 MED ORDER — MIDAZOLAM HCL 2 MG/2ML IJ SOLN
INTRAMUSCULAR | Status: AC
  Filled 2018-09-25: qty 2

## 2018-09-25 MED ORDER — ROCURONIUM BROMIDE 10 MG/ML (PF) SYRINGE
60.0000 mg | PREFILLED_SYRINGE | Freq: Once | INTRAVENOUS | Status: DC
  Filled 2018-09-25: qty 10

## 2018-09-25 MED ORDER — FENTANYL CITRATE (PF) 100 MCG/2ML IJ SOLN
INTRAMUSCULAR | Status: AC
  Filled 2018-09-25: qty 2

## 2018-09-25 MED ORDER — FENTANYL CITRATE (PF) 100 MCG/2ML IJ SOLN
25.0000 ug | INTRAMUSCULAR | Status: DC | PRN
  Administered 2018-09-28 – 2018-09-29 (×2): 25 ug via INTRAVENOUS
  Filled 2018-09-25 (×2): qty 2

## 2018-09-25 MED ORDER — MIDAZOLAM HCL 2 MG/2ML IJ SOLN
1.0000 mg | INTRAMUSCULAR | Status: DC | PRN
  Administered 2018-09-27: 1 mg via INTRAVENOUS
  Filled 2018-09-25 (×2): qty 2

## 2018-09-25 MED ORDER — ETOMIDATE 2 MG/ML IV SOLN
20.0000 mg | Freq: Once | INTRAVENOUS | Status: AC
  Administered 2018-09-25: 20 mg via INTRAVENOUS

## 2018-09-25 NOTE — Progress Notes (Signed)
PROGRESS NOTE                                                                                                                                                                                                             Patient Demographics:    Sabrina Baker, is a 78 y.o. female, DOB - [birth year], VVK:[id number]  Outpatient Primary MD for the patient is [name], MD    LOS - 70  Chief Complaint  Patient presents with   Abdominal Pain   Cough       Brief Narrative: Patient is a 78 y.o. female with PMHx of HTN, DM, CKD stage III, presented with shortness of breath and cough, found to have acute toxic respiratory failure due to ARDS in the setting of COVID-19 infection.  Significant hospital events: 3/30 Admitted, intubated 4/08 PSV 4 hours failed SBT due to agitation, CXR with improved aeration  4/09 Failed wean due to agitation 4/10 Hypotensive despite decreased sedation, levophed initiated 4/11 Weaned 4 hours 4/12 PSV 10/8 4/13 transfer to [facility] 4/15 Extubated   Subjective:      Assessment  & Plan :  Acute Hypoxic Resp Failure due to Acute Covid 19 Viral Illness during the ongoing 2020 Covid 19 Pandemic: Overall improved-if able to come off pressors-we will reattempt a spontaneous breathing trial.  PCCM following.  Fever: Febrile earlier this morning-we will panculture/obtain CXR-if persistently febrile-we will restart antimicrobial therapy.Has PICC line in place (placed 3/31)  Agitation/delirium: Per nursing staff-less agitated-slowly improving-on low-dose Precedex-Klonopin dose has also been slowly titrated down.    AKI on CKD stage III: AKI likely hemodynamically mediated-creatinine has improved-and now close to usual baseline.  Monitor electrolytes periodically.  Hypotension: Reoccurred earlier this morning-etiology uncertain-not sure if this from sedative agents or from brewing sepsis-continue low-dose  levo fed to maintain MAP> 65   PAF: Maintaining sinus rhythm-continue Eliquis.  Anemia: Secondary to critical illness-no evidence of blood loss-follow hemoglobin, transfuse if < 7  DM-2: No further episodes of hypoglycemia-continue Lantus 22 units, and SSI.  Follow CBGs.  History of HTN: Continue to hold all antihypertensives-is hypotensive and on pressors  Depression: Continue valproic acid and gabapentin  Vent Settings: Vent Mode: PSV;CPAP FiO2 (%):  [40 %] 40 % Set Rate:  [18 bmp-22 bmp] 18 bmp Vt Set:  [310 mL] 310 mL PEEP:  [5  cmH20-8 cmH20] 5 cmH20 Pressure Support:  [5 cmH20-10 cmH20] 5 cmH20 Plateau Pressure:  [18 cmH20] 18 cmH20  ABG:    Component Value Date/Time   PHART 7.533 (H) 09/25/2018 0407   PCO2ART 45.5 09/25/2018 0407   PO2ART 59.0 (L) 09/25/2018 0407   HCO3 38.3 (H) 09/25/2018 0407   TCO2 40 (H) 09/25/2018 0407   ACIDBASEDEF 2.0 09/11/2018 0401   O2SAT 93.0 09/25/2018 0407    Condition - Extremely Guarded  Family Communication: Daughter updated over the phone on 4/15  Code Status : Full code  Diet : N.p.o until she is more awake and alert  Disposition Plan : Remain in the ICU  Consults : PCCM  Procedures  :   3/30>> ETT 3/21>> RUE PICC 4/10>> A line-out  GI prophylaxis: Protonix  DVT Prophylaxis  : Eliquis  Lab Results  Component Value Date   PLT 326 09/24/2018    Inpatient Medications  Scheduled Meds:  apixaban  5 mg Per Tube BID   chlorhexidine gluconate (MEDLINE KIT)  15 mL Mouth Rinse BID   Chlorhexidine Gluconate Cloth  6 each Topical Daily   clonazepam  0.5 mg Per Tube BID   docusate  100 mg Per Tube BID   feeding supplement (PRO-STAT SUGAR FREE 64)  30 mL Per Tube BID   free water  300 mL Per Tube Q8H   furosemide  40 mg Intravenous Daily   gabapentin  100 mg Per Tube QHS   insulin aspart  0-9 Units Subcutaneous Q4H   insulin glargine  22 Units Subcutaneous Q24H   mouth rinse  15 mL Mouth Rinse 10 times per  day   pantoprazole sodium  40 mg Per Tube Daily   polyethylene glycol  17 g Per Tube Daily   sodium chloride flush  10-40 mL Intracatheter Q12H   valproic acid  250 mg Per Tube BID   Continuous Infusions:  sodium chloride 10 mL/hr at 09/25/18 0600   dexmedetomidine (PRECEDEX) IV infusion 0.6 mcg/kg/hr (09/25/18 0600)   feeding supplement (OSMOLITE 1.2 CAL) 1,000 mL (09/24/18 2346)   norepinephrine (LEVOPHED) Adult infusion 10 mcg/min (09/25/18 0613)   PRN Meds:.acetaminophen (TYLENOL) oral liquid 160 mg/5 mL, albuterol, bisacodyl, fentaNYL (SUBLIMAZE) injection, hydrALAZINE, labetalol, midazolam, sodium chloride flush  Antibiotics  :    Anti-infectives (From admission, onward)   Start     Dose/Rate Route Frequency Ordered Stop   09/11/18 2200  hydroxychloroquine (PLAQUENIL) tablet 200 mg  Status:  Discontinued     200 mg Oral 2 times daily 09/10/18 2309 09/10/18 2319   09/11/18 2200  hydroxychloroquine (PLAQUENIL) tablet 200 mg     200 mg Per Tube 2 times daily 09/10/18 2319 09/15/18 0851   09/10/18 2330  hydroxychloroquine (PLAQUENIL) tablet 400 mg     400 mg Per Tube 2 times daily 09/10/18 2319 09/11/18 0823   09/10/18 2315  hydroxychloroquine (PLAQUENIL) tablet 400 mg  Status:  Discontinued     400 mg Oral 2 times daily 09/10/18 2309 09/10/18 2319   09/10/18 1300  azithromycin (ZITHROMAX) 500 mg in sodium chloride 0.9 % 250 mL IVPB     500 mg 250 mL/hr over 60 Minutes Intravenous Every 24 hours 09/09/18 1342 09/15/18 1531   09/10/18 1200  cefTRIAXone (ROCEPHIN) 1 g in sodium chloride 0.9 % 100 mL IVPB  Status:  Discontinued     1 g 200 mL/hr over 30 Minutes Intravenous Every 24 hours 09/09/18 1342 09/14/18 1436   09/09/18 1200  cefTRIAXone (ROCEPHIN)  1 g in sodium chloride 0.9 % 100 mL IVPB     1 g 200 mL/hr over 30 Minutes Intravenous  Once 09/09/18 1148 09/09/18 1248   09/09/18 1200  azithromycin (ZITHROMAX) 500 mg in sodium chloride 0.9 % 250 mL IVPB     500 mg 250  mL/hr over 60 Minutes Intravenous  Once 09/09/18 1148 09/09/18 1351      Time spent 35 minutes   Oren Binet M.D on 09/25/2018 at 9:03 AM  To page go to [email or web address] - password [email or web address]  [facility] -  Office  [phone]  See all Orders from today for further details  Admit date - 09/09/2018    16    Objective:   Vitals:   09/25/18 0545 09/25/18 0558 09/25/18 0600 09/25/18 0735  BP:   (!) 53/33 (!) 122/48  Pulse: 91  83 79  Resp: 18  18 (!) 26  Temp:  100.2 F (37.9 C)    TempSrc:  Oral    SpO2: 97%  99% 97%  Weight:      Height:        Wt Readings from Last 3 Encounters:  09/23/18 75.1 kg  06/21/17 70.3 kg  01/24/16 67.1 kg     Intake/Output Summary (Last 24 hours) at 09/25/2018 0903 Last data filed at 09/25/2018 8889 Gross per 24 hour  Intake 1700.68 ml  Output 2075 ml  Net -374.32 ml     Physical Exam Gen Exam: He remains intubated-awakes briefly.  Occasionally follows commands.   HEENT: Atraumatic Normocephalic Chest: bibasilar rales CVS: S1 S2 regular Abdomen: Soft, nontender and not distended Extremities: No edema Neurology: difficult to exam-but seems to be moving all 4 extremities. Skin: No rash   Data Review:    CBC Recent Labs  Lab 09/20/18 0525  09/21/18 0433 09/22/18 0254 09/22/18 0520 09/22/18 0615 09/23/18 0430 09/24/18 1003 09/25/18 0407  WBC 13.1*  --  13.1* 12.4*  --  18.4* 12.5* 14.5*  --   HGB 9.2*   < > 9.4* 9.2* 10.5* 9.7* 7.9* 7.9* 7.8*  HCT 31.0*   < > 31.2* 30.4* 31.0* 32.7* 26.4* 25.5* 23.0*  PLT 290  --  348 295  --  353 310 326  --   MCV 100.0  --  99.0 98.1  --  98.5 98.1 100.0  --   MCH 29.7  --  29.8 29.7  --  29.2 29.4 31.0  --   MCHC 29.7*  --  30.1 30.3  --  29.7* 29.9* 31.0  --   RDW 14.5  --  14.5 14.4  --  14.6 14.9 15.1  --   LYMPHSABS 1.2  --  1.1 1.0  --  1.1 0.9  --   --   MONOABS 1.0  --  0.9 0.8  --  1.1* 1.0  --   --   EOSABS 0.3  --  0.3 0.3  --  0.4 0.3  --   --   BASOSABS 0.0  --   0.0 0.0  --  0.1 0.0  --   --    < > = values in this interval not displayed.    Chemistries  Recent Labs  Lab 09/19/18 0400 09/20/18 0525 09/20/18 1041  09/21/18 0433  09/22/18 0615 09/23/18 0430 09/24/18 0500 09/25/18 0350 09/25/18 0407  NA 148* 146*  --    < > 143   < > 141 145 142 143 140  K 4.2 4.5  --    < >  4.6   < > 4.9 4.1 4.4 4.4 4.5  CL 104 101  --   --  99  --  98 99 99 99  --   CO2 34* 36*  --   --  32  --  31 36* 33* 35*  --   GLUCOSE 216* 134*  --   --  109*  --  187* 60* 123* 156*  --   BUN 127* 119*  --   --  111*  --  100* 93* 90* 86*  --   CREATININE 1.98* 1.81*  --   --  1.62*  --  1.83* 1.67* 1.63* 1.71*  --   CALCIUM 10.2 10.4*  --   --  10.4*  --  10.1 10.0 9.6 9.1  --   MG  --  2.3  --   --  2.3  --  2.2 2.1  --   --   --   AST 22  --  22  --   --   --   --  37  --   --   --   ALT 13  --  12  --   --   --   --  21  --   --   --   ALKPHOS 36*  --  39  --   --   --   --  35*  --   --   --   BILITOT 0.4  --  0.4  --   --   --   --  0.5  --   --   --    < > = values in this interval not displayed.   ------------------------------------------------------------------------------------------------------------------ No results for input(s): CHOL, HDL, LDLCALC, TRIG, CHOLHDL, LDLDIRECT in the last 72 hours.  Lab Results  Component Value Date   HGBA1C 7.3 (H) 04/19/2012   ------------------------------------------------------------------------------------------------------------------ No results for input(s): TSH, T4TOTAL, T3FREE, THYROIDAB in the last 72 hours.  Invalid input(s): FREET3 ------------------------------------------------------------------------------------------------------------------ No results for input(s): VITAMINB12, FOLATE, FERRITIN, TIBC, IRON, RETICCTPCT in the last 72 hours.  Coagulation profile No results for input(s): INR, PROTIME in the last 168 hours.  No results for input(s): DDIMER in the last 72 hours.  Cardiac  Enzymes Recent Labs  Lab 09/20/18 1041 09/20/18 2222 09/21/18 0433  TROPONINI 0.03* <0.03 0.04*   ------------------------------------------------------------------------------------------------------------------    Component Value Date/Time   BNP 278.8 (H) 09/25/2018 0350    Micro Results Recent Results (from the past 240 hour(s))  Culture, Urine     Status: Abnormal   Collection Time: 09/22/18  4:00 PM  Result Value Ref Range Status   Specimen Description URINE, CATHETERIZED  Final   Special Requests   Final    Normal Performed at [facility], [place] [address], [place], [place] [address]    Culture (A)  Final    >=100,000 COLONIES/mL ENTEROCOCCUS FAECALIS 10,000 COLONIES/mL CITROBACTER FREUNDII    Report Status 09/24/2018 FINAL  Final   Organism ID, Bacteria ENTEROCOCCUS FAECALIS (A)  Final   Organism ID, Bacteria CITROBACTER FREUNDII (A)  Final      Susceptibility   Citrobacter freundii - MIC*    CEFAZOLIN >=64 RESISTANT Resistant     CEFTRIAXONE <=1 SENSITIVE Sensitive     CIPROFLOXACIN <=0.25 SENSITIVE Sensitive     GENTAMICIN <=1 SENSITIVE Sensitive     IMIPENEM <=0.25 SENSITIVE Sensitive     NITROFURANTOIN <=16 SENSITIVE Sensitive     TRIMETH/SULFA <=20 SENSITIVE Sensitive  PIP/TAZO <=4 SENSITIVE Sensitive     * 10,000 COLONIES/mL CITROBACTER FREUNDII   Enterococcus faecalis - MIC*    AMPICILLIN <=2 SENSITIVE Sensitive     LEVOFLOXACIN 2 SENSITIVE Sensitive     NITROFURANTOIN <=16 SENSITIVE Sensitive     VANCOMYCIN 2 SENSITIVE Sensitive     * >=100,000 COLONIES/mL ENTEROCOCCUS FAECALIS  Culture, respiratory (non-expectorated)     Status: None   Collection Time: 09/22/18  4:00 PM  Result Value Ref Range Status   Specimen Description TRACHEAL ASPIRATE  Final   Special Requests Normal  Final   Gram Stain   Final    MODERATE WBC PRESENT,BOTH PMN AND MONONUCLEAR MODERATE GRAM POSITIVE COCCI IN PAIRS IN CLUSTERS MODERATE GRAM NEGATIVE RODS Performed  at [facility], [place] [address], [place], [place] [address]    Culture FEW KLEBSIELLA PNEUMONIAE  Final   Report Status 09/25/2018 FINAL  Final   Organism ID, Bacteria KLEBSIELLA PNEUMONIAE  Final      Susceptibility   Klebsiella pneumoniae - MIC*    AMPICILLIN RESISTANT Resistant     CEFAZOLIN <=4 SENSITIVE Sensitive     CEFEPIME <=1 SENSITIVE Sensitive     CEFTAZIDIME <=1 SENSITIVE Sensitive     CEFTRIAXONE <=1 SENSITIVE Sensitive     CIPROFLOXACIN <=0.25 SENSITIVE Sensitive     GENTAMICIN <=1 SENSITIVE Sensitive     IMIPENEM <=0.25 SENSITIVE Sensitive     TRIMETH/SULFA <=20 SENSITIVE Sensitive     AMPICILLIN/SULBACTAM 4 SENSITIVE Sensitive     PIP/TAZO <=4 SENSITIVE Sensitive     Extended ESBL NEGATIVE Sensitive     * FEW KLEBSIELLA PNEUMONIAE  Culture, blood (routine x 2)     Status: None (Preliminary result)   Collection Time: 09/22/18  4:13 PM  Result Value Ref Range Status   Specimen Description BLOOD LEFT ANTECUBITAL  Final   Special Requests   Final    BOTTLES DRAWN AEROBIC ONLY Blood Culture adequate volume   Culture   Final    NO GROWTH 2 DAYS Performed at [facility], [address], [place], [place] [address]    Report Status PENDING  Incomplete  Culture, blood (routine x 2)     Status: None (Preliminary result)   Collection Time: 09/22/18  6:25 PM  Result Value Ref Range Status   Specimen Description BLOOD LEFT HAND  Final   Special Requests   Final    BOTTLES DRAWN AEROBIC AND ANAEROBIC Blood Culture adequate volume   Culture   Final    NO GROWTH 2 DAYS Performed at [facility], [place] [address], [place], [place] [address]    Report Status PENDING  Incomplete    Radiology Reports Dg Chest Port 1 View  Result Date: 09/24/2018 CLINICAL DATA:  Short of breath EXAM: PORTABLE CHEST 1 VIEW COMPARISON:  09/23/2018 FINDINGS: Normal heart size. Endotracheal tube, NG tube, and right PICC are stable. Patchy airspace opacities throughout both lungs  are not significantly changed. No pneumothorax. IMPRESSION: Stable patchy airspace disease throughout both lungs. Electronically Signed   By: Marybelle Killings M.D.   On: 09/24/2018 07:41   Dg Chest Port 1 View  Result Date: 09/23/2018 CLINICAL DATA:  Respiratory failure. EXAM: PORTABLE CHEST 1 VIEW COMPARISON:  One-view chest x-ray 09/21/2018 FINDINGS: The heart size is normal. Endotracheal tube is stable. NG tube courses off the inferior border the film. Right-sided PICC line is stable and in satisfactory position. Bilateral interstitial and airspace disease is similar the prior exam.  There is improved aeration at the left base. Increased airspace opacities are noted in the right middle and upper lobes. IMPRESSION: 1. Bilateral interstitial and airspace disease concerning for infection. There is slight increase on the right and some improvement on the left. 2. Support apparatus is stable. Electronically Signed   By: San Morelle M.D.   On: 09/23/2018 07:41   Dg Chest Port 1 View  Result Date: 09/21/2018 CLINICAL DATA:  Respiratory failure. EXAM: PORTABLE CHEST 1 VIEW COMPARISON:  Radiograph of September 21, 2018. FINDINGS: Stable cardiomediastinal silhouette. Endotracheal and nasogastric tubes are in grossly good position. Right-sided PICC line is unchanged. No pneumothorax or significant pleural effusion is noted. Stable bilateral lung opacities are noted concerning for pneumonia or possibly edema. Bony thorax is unremarkable. IMPRESSION: Stable support apparatus. Stable bilateral lung opacities as described above. Electronically Signed   By: Marijo Conception, M.D.   On: 09/21/2018 22:03   Dg Chest Port 1 View  Result Date: 09/21/2018 CLINICAL DATA:  Respiratory failure EXAM: PORTABLE CHEST 1 VIEW COMPARISON:  09/20/2018 FINDINGS: Endotracheal tube in good position. Right arm PICC tip in the proximal SVC unchanged. NG tube in the stomach. Mild improvement in bilateral airspace disease. Small left  effusion. IMPRESSION: Endotracheal tube in good position Diffuse bilateral airspace disease with mild improvement. Electronically Signed   By: Franchot Gallo M.D.   On: 09/21/2018 08:10   Dg Chest Port 1 View  Result Date: 09/20/2018 CLINICAL DATA:  Respiratory failure.  Ventilator support. EXAM: PORTABLE CHEST 1 VIEW COMPARISON:  09/18/2018 FINDINGS: Endotracheal tube tip is 4 cm above the carina. Orogastric tube enters the abdomen. Bilateral patchy pulmonary infiltrates appear quite similar, with slightly more volume loss at the right lung base. No new finding otherwise. IMPRESSION: Persistent patchy bilateral pulmonary infiltrates. Slight worsening of volume loss at the right lung base. Electronically Signed   By: Nelson Chimes M.D.   On: 09/20/2018 06:11   Dg Chest Port 1 View  Result Date: 09/18/2018 CLINICAL DATA:  ARDS, COVID+ Pt intubated on airborne and contact PPE: Gloves, gown, N95, goggles, bouffant. EXAM: PORTABLE CHEST - 1 VIEW COMPARISON:  09/16/2018 FINDINGS: Endotracheal tube, nasogastric tube, right arm PICC stable. Slight improvement in right lower lung airspace disease with patchy hazy infiltrates in the left mid and lower lung and right upper lung as before. Heart size and mediastinal contours are within normal limits. No effusion. No pneumothorax. Fixation hardware in the proximal right humerus. IMPRESSION: 1. Patchy bilateral airspace disease, with slight improvement in right lower lung opacities since prior study. 2. Support hardware stable in position. Electronically Signed   By: Lucrezia Europe M.D.   On: 09/18/2018 08:05   Dg Chest Port 1 View  Result Date: 09/16/2018 CLINICAL DATA:  Respiratory failure EXAM: PORTABLE CHEST 1 VIEW COMPARISON:  Yesterday FINDINGS: Endotracheal tube tip between the clavicular heads and carina. The orogastric tube at least reaches the stomach. Right-sided central line with tip at the SVC. No appreciable change in patchy bilateral interstitial and airspace  opacity. Stable heart size. No evidence of air leak or pleural fluid. IMPRESSION: 1. Unchanged multifocal pneumonia. 2. Stable hardware positioning. Electronically Signed   By: Monte Fantasia M.D.   On: 09/16/2018 07:09   Dg Chest Port 1 View  Result Date: 09/15/2018 CLINICAL DATA:  Respiratory failure. Intubated patient. Follow-up exam. EXAM: PORTABLE CHEST 1 VIEW COMPARISON:  Prior studies, most recent 09/14/2018 FINDINGS: Cardiac silhouette is normal size.  No mediastinal or hilar masses. There  are hazy bilateral airspace lung opacities which are without change from the previous day's exam. No new lung abnormalities. No convincing pleural effusion and no pneumothorax. Endotracheal tube, nasal/orogastric tube and right PICC are stable. IMPRESSION: 1. No change from previous day's study. 2. Persistent bilateral ground-glass lung opacities. 3. Stable well-positioned support apparatus. Electronically Signed   By: Lajean Manes M.D.   On: 09/15/2018 06:47   Dg Chest Port 1 View  Result Date: 09/14/2018 CLINICAL DATA:  Respiratory failure.  COVID-19. EXAM: PORTABLE CHEST 1 VIEW COMPARISON:  09/13/2018 and prior studies dating back to 09/09/2018. FINDINGS: Bilateral ground-glass lung opacities are without significant change from the prior exams. Atelectasis at the left lung base is stable. More confluent opacity at the right lung base is less prominent, which appears to be due to larger lung volumes on the current exam. No new lung abnormalities.  No pleural effusion or pneumothorax. Endotracheal tube, nasal/orogastric tube and right PICC are stable in well positioned. IMPRESSION: 1. No significant change from the previous day's study. 2. Persistent bilateral ground-glass lung opacities more confluent basilar opacities. 3. Stable well-positioned support apparatus. Electronically Signed   By: Lajean Manes M.D.   On: 09/14/2018 06:24   Dg Chest Port 1 View  Result Date: 09/13/2018 CLINICAL DATA:  Respiratory  failure EXAM: PORTABLE CHEST 1 VIEW COMPARISON:  Yesterday FINDINGS: Endotracheal tube tip between the clavicular heads and carina. The orogastric tube has been advanced and now at least reaches the stomach-as confirmed on interval KUB. Right PICC with tip at the SVC. Patchy bilateral airspace disease. Borderline heart size. No effusion or pneumothorax. IMPRESSION: Stable hardware positioning and bilateral pneumonia. Electronically Signed   By: Monte Fantasia M.D.   On: 09/13/2018 06:08   Dg Chest Port 1 View  Result Date: 09/12/2018 CLINICAL DATA:  History of endotracheal tube EXAM: PORTABLE CHEST 1 VIEW COMPARISON:  Yesterday FINDINGS: Endotracheal tube tip between the clavicular heads and carina. The orogastric tube tip is in the region of the GE junction. Right PICC with tip near the SVC origin. Patchy bilateral lung opacity with both interstitial and airspace appearance. No effusion or pneumothorax. Normal heart size. These results will be called to the ordering clinician or representative by the Radiologist Assistant, and communication documented in the PACS or zVision Dashboard. IMPRESSION: 1. Shortened orogastric tube with tip now at the GE junction. 2. Other hardware positioning is stable. 3. Bilateral lung opacity with progression. Electronically Signed   By: Monte Fantasia M.D.   On: 09/12/2018 07:14   Dg Chest Port 1 View  Result Date: 09/11/2018 CLINICAL DATA:  Respiratory failure EXAM: PORTABLE CHEST 1 VIEW COMPARISON:  09/10/2018 FINDINGS: Endotracheal tube and NG tube are stable. Interval placement of right PICC line with the tip in the SVC. Patchy bilateral airspace disease similar to prior study. Heart is normal size. No visible effusions or acute bony abnormality. IMPRESSION: Right PICC line tip in the SVC. Remainder of the support devices stable. Stable patchy bilateral airspace disease. Electronically Signed   By: Rolm Baptise M.D.   On: 09/11/2018 07:36   Dg Chest Port 1  View  Result Date: 09/10/2018 CLINICAL DATA:  Respiratory failure EXAM: PORTABLE CHEST 1 VIEW COMPARISON:  09/09/2018 FINDINGS: Support devices are stable. Heart is normal size. Patchy bilateral airspace disease again noted, not significantly changed. No visible effusions or acute bony abnormality. IMPRESSION: Patchy bilateral airspace disease, not significantly changed. Electronically Signed   By: Rolm Baptise M.D.   On: 09/10/2018  07:39   Dg Chest Port 1 View  Result Date: 09/09/2018 CLINICAL DATA:  Respiratory failure. EXAM: PORTABLE CHEST 1 VIEW COMPARISON:  Chest x-ray from same day at 1 a.m. FINDINGS: Interval placement of an endotracheal tube with the tip 3.9 cm above the carina. Enteric tube entering the stomach with the tip below the field of view. The heart size and mediastinal contours are within normal limits. Patchy asymmetric, hazy opacities in both lungs again noted, slightly more consolidative appearance in the left mid lung and right lower lobe. No pleural effusion or pneumothorax. No acute osseous abnormality. IMPRESSION: 1. Appropriately positioned endotracheal tube. 2. Patchy asymmetric airspace disease in both lungs again noted, with slightly more focal consolidative appearance in the left mid lung and right lower lobe. Findings are nonspecific, but concerning for atypical infection, including potential viral pneumonia. Electronically Signed   By: Titus Dubin M.D.   On: 09/09/2018 17:38   Dg Chest Port 1 View  Result Date: 09/09/2018 CLINICAL DATA:  Short of breath, cough.  Diarrhea. EXAM: PORTABLE CHEST 1 VIEW COMPARISON:  Chest radiograph 05/29/2012 FINDINGS: Normal cardiac silhouette. There is bilateral fine airspace disease in the upper lobes. No pleural fluid. No pneumothorax. No acute osseous abnormality. RIGHT shoulder internal fixation. IMPRESSION: Bilateral upper lobe airspace disease representing multifocal pneumonia versus pulmonary edema. Electronically Signed   By:  Suzy Bouchard M.D.   On: 09/09/2018 11:27   Dg Abd Portable 1v  Result Date: 09/12/2018 CLINICAL DATA:  Orogastric tube placement. EXAM: PORTABLE ABDOMEN - 1 VIEW COMPARISON:  CT abdomen and pelvis June 24, 2018 FINDINGS: Nasogastric tube tip projects in gastric antrum. No intra-abdominal mass effect or pathologic calcifications. Included bowel gas pattern is nondilated and nonobstructive. Mild vascular calcifications. Interstitial and alveolar airspace opacities included lung bases better characterized on today's dedicated radiograph. Thoracolumbar levoscoliosis. IMPRESSION: 1. Nasogastric tube tip projects in gastric antrum. Electronically Signed   By: Elon Alas M.D.   On: 09/12/2018 22:13   [place] Ekg Site Rite  Result Date: 09/10/2018 If Site Rite image not attached, placement could not be confirmed due to current cardiac rhythm.

## 2018-09-25 NOTE — Progress Notes (Signed)
NAME:  Sabrina Baker, MRN:  [id number], DOB:  Jan 29, 1941, LOS: 70 ADMISSION DATE:  09/09/2018, CONSULTATION DATE:  09/09/18 REFERRING MD: Regenia Skeeter, CHIEF COMPLAINT:  SOB, diarrhea    Brief History   78 y/o F admitted 3/30 from home with c/o of cough, SOB, and abdominal pain x 2 weeks. Progressive hypoxia in ER now on NRB and CXR with bilateral infiltrates. Intubated.  COVID Positive.     Significant Hospital Events   3/30 Admitted, intubated 4/08 PSV 4 hours failed SBT due to agitation, CXR with improved aeration  4/09 Failed wean due to agitation 4/10 Hypotensive despite decreased sedation, levophed initiated 4/11 Weaned 4 hours 4/12 PSV 10/8  Consults:  Palliative care 4/5  Procedures:  3/30 ETT >>  RUE PICC 3/31 >>  ALine 4/10 >> out  Significant Diagnostic Tests:      Micro Data:  3/30 BC x 2 > negative 3/30 RVP > neg 3/30 COVID-19 >> positive 3/30 Trach asp >> normal flora 3/30 urine strep >> negative  3/30 urine legionella >> negative  Antimicrobials:  3/30 azithro >> completed  3/30 ceftriaxone >> completed 09/10/2018 Plaquenil >> completed  Interim history/subjective:  Passing spontaneous breathing trial Following commands  Objective   Blood pressure 140/66, pulse 85, temperature 98.6 F (37 C), temperature source Axillary, resp. rate (!) 25, height 5\' 3"  (1.6 m), weight 75.1 kg, SpO2 100 %.    Vent Mode: PSV;CPAP FiO2 (%):  [40 %] 40 % Set Rate:  [18 bmp-22 bmp] 18 bmp Vt Set:  [310 mL] 310 mL PEEP:  [5 cmH20-8 cmH20] 5 cmH20 Pressure Support:  [5 cmH20-10 cmH20] 5 cmH20 Plateau Pressure:  [18 cmH20] 18 cmH20   Intake/Output Summary (Last 24 hours) at 09/25/2018 1102 Last data filed at 09/25/2018 1000 Gross per 24 hour  Intake 1694.22 ml  Output 1495 ml  Net 199.22 ml    Physical Exam  General:  In bed on vent HENT: NCAT ETT in place PULM: CTA B, vent supported breathing CV: RRR, no mgr GI: BS+, soft, nontender MSK: normal bulk and tone  Neuro: sleepy on vent, follows comamnds     Resolved Hospital Problem list     Assessment & Plan:   Acute Hypoxemic Respiratory Failure secondary to SARS-CoV-2 ARDS : Oxygenation improved, chest x-ray about the same P:  Extubation today Close monitoring afterwards Aspiration precautions Transfer to another pod after extubation Continue O2 to maintain O2 saturation greater than 88%  Agitated Delirium significant anxiety: Improved Sedation Needs on Mechanical Ventilation : Resolved P: Stop sedation protocol Stop Depakote Stop clonazepam Stop PRN Versed   Hypotension/shock overnight: Agree with obtaining blood cultures, chest x-ray Titrate Levophed as needed to maintain mean arterial pressure greater than 65 Low threshold to start antibiotics depending on findings from chest x-ray    Best practice:  Diet: NPO Pain/Anxiety/Delirium protocol (if indicated): in place  VAP protocol (if indicated): Yes DVT prophylaxis: Eliquis   GI prophylaxis: protonix  Glucose control: Lantus, SSI  Mobility: bedrest Code Status: full Family Communication: TRH  Disposition: ICU   Labs   CBC: Recent Labs  Lab 09/20/18 0525  09/21/18 0433 09/22/18 0254 09/22/18 0520 09/22/18 0615 09/23/18 0430 09/24/18 1003 09/25/18 0407  WBC 13.1*  --  13.1* 12.4*  --  18.4* 12.5* 14.5*  --   NEUTROABS 10.5*  --  10.7* 10.3*  --  15.7* 10.2*  --   --   HGB 9.2*   < > 9.4* 9.2* 10.5* 9.7* 7.9*  7.9* 7.8*  HCT 31.0*   < > 31.2* 30.4* 31.0* 32.7* 26.4* 25.5* 23.0*  MCV 100.0  --  99.0 98.1  --  98.5 98.1 100.0  --   PLT 290  --  348 295  --  353 310 326  --    < > = values in this interval not displayed.    Basic Metabolic Panel: Recent Labs  Lab 09/20/18 0525  09/21/18 0433 09/22/18 0254  09/22/18 0615 09/23/18 0430 09/24/18 0500 09/25/18 0350 09/25/18 0407  NA 146*   < > 143  --    < > 141 145 142 143 140  K 4.5   < > 4.6  --    < > 4.9 4.1 4.4 4.4 4.5  CL 101  --  99  --   --   98 99 99 99  --   CO2 36*  --  32  --   --  31 36* 33* 35*  --   GLUCOSE 134*  --  109*  --   --  187* 60* 123* 156*  --   BUN 119*  --  111*  --   --  100* 93* 90* 86*  --   CREATININE 1.81*  --  1.62*  --   --  1.83* 1.67* 1.63* 1.71*  --   CALCIUM 10.4*  --  10.4*  --   --  10.1 10.0 9.6 9.1  --   MG 2.3  --  2.3  --   --  2.2 2.1  --   --   --   PHOS 5.8*  --  6.3* 6.3*  --  5.8* 5.5*  --   --   --    < > = values in this interval not displayed.   GFR: Estimated Creatinine Clearance: 26.7 mL/min (A) (by C-G formula based on SCr of 1.71 mg/dL (H)). Recent Labs  Lab 09/19/18 0400  09/22/18 0254 09/22/18 0615 09/23/18 0430 09/24/18 1003  PROCALCITON 0.35  --   --   --  0.55  --   WBC 17.4*   < > 12.4* 18.4* 12.5* 14.5*   < > = values in this interval not displayed.    Liver Function Tests: Recent Labs  Lab 09/19/18 0400 09/20/18 1041 09/23/18 0430  AST 22 22 37  ALT 13 12 21   ALKPHOS 36* 39 35*  BILITOT 0.4 0.4 0.5  PROT 5.5* 5.4* 5.3*  ALBUMIN 1.6* 1.7* 1.6*   No results for input(s): LIPASE, AMYLASE in the last 168 hours. No results for input(s): AMMONIA in the last 168 hours.  ABG    Component Value Date/Time   PHART 7.533 (H) 09/25/2018 0407   PCO2ART 45.5 09/25/2018 0407   PO2ART 59.0 (L) 09/25/2018 0407   HCO3 38.3 (H) 09/25/2018 0407   TCO2 40 (H) 09/25/2018 0407   ACIDBASEDEF 2.0 09/11/2018 0401   O2SAT 93.0 09/25/2018 0407     Coagulation Profile: No results for input(s): INR, PROTIME in the last 168 hours.  Cardiac Enzymes: Recent Labs  Lab 09/20/18 1041 09/20/18 2222 09/21/18 0433 09/22/18 0254  CKTOTAL  --   --   --  11*  TROPONINI 0.03* <0.03 0.04*  --     HbA1C: Hgb A1c MFr Bld  Date/Time Value Ref Range Status  04/19/2012 03:43 PM 7.3 (H) <5.7 % Final    Comment:    (NOTE)  According to the ADA Clinical Practice Recommendations for 2011, when HbA1c is used as  a screening test:  >=6.5%   Diagnostic of Diabetes Mellitus           (if abnormal result is confirmed) 5.7-6.4%   Increased risk of developing Diabetes Mellitus References:Diagnosis and Classification of Diabetes Mellitus,Diabetes Care,2011,34(Suppl 1):S62-S69 and Standards of Medical Care in         Diabetes - 2011,Diabetes QTMA,2633,35 (Suppl 1):S11-S61.  04/18/2012 09:20 AM 7.3 (H) <5.7 % Final    Comment:    (NOTE)                                                                       According to the ADA Clinical Practice Recommendations for 2011, when HbA1c is used as a screening test:  >=6.5%   Diagnostic of Diabetes Mellitus           (if abnormal result is confirmed) 5.7-6.4%   Increased risk of developing Diabetes Mellitus References:Diagnosis and Classification of Diabetes Mellitus,Diabetes KTGY,5638,93(TDSKA 1):S62-S69 and Standards of Medical Care in         Diabetes - 2011,Diabetes Care,2011,34 (Suppl 1):S11-S61.    CBG: Recent Labs  Lab 09/24/18 1954 09/24/18 2321 09/25/18 0340 09/25/18 0403 09/25/18 0835  GLUCAP 159* 202* 157* 177* 206*      Critical care time: 45 minutes      This patient is critically ill with multiple organ system failure; which, requires frequent high complexity decision making, assessment, support, evaluation, and titration of therapies. This was completed through the application of advanced monitoring technologies and extensive interpretation of multiple databases. During this encounter critical care time was devoted to patient care services described in this note for 30 minutes.  Roselie Awkward, MD [facility] Pager: [phone] Cell: [phone] If no response, call [phone]

## 2018-09-25 NOTE — Progress Notes (Signed)
Patient intubated for increased respiratory distress, worsening stridor by Dr. Lake Bells.  Patient intubated with 7.5 ETT secured at 25 at the lip.  Placed on Servo I ventilator with settings PRVC 18/ VT 400/ FiO2 1.00, PEEP 5.  Patient tolerated procedure well.  Will continue to monitor.

## 2018-09-25 NOTE — Progress Notes (Signed)
Patient very dyschronous on the vent. RN has given all the sedation medicine patient can have. Patient still continues to breath in the 40s. RT bagged lavaged patient without any improvement. RT changed patient to [facility] and patient is tolerating well at this time. Will get a ABG in hour for the following changes.

## 2018-09-25 NOTE — Progress Notes (Signed)
   09/25/18 1300  Clinical Encounter Type  Visited With Family  Visit Type Follow-up;Psychological support;Spiritual support  Referral From Family  Consult/Referral To Chaplain  Spiritual Encounters  Spiritual Needs Emotional;Other (Comment) (Spiritual Care Conversation/Support)  Stress Factors  Family Stress Factors Other (Comment) (Left a voice message )   I called the patient's daughter per request for a follow up. She did not answer the phone, so I left a message.    Chaplain Shanon Ace M.Div., [facility]

## 2018-09-25 NOTE — Progress Notes (Signed)
Patient bp dropped into low 19'U systolic.  Levophed started.

## 2018-09-25 NOTE — Progress Notes (Signed)
[facility]  She had worsening stridor and respiratory distress  I intubated her, she had severe cord edema, some mucus on cords; narrow slit available for intubation  Back on full vent support Sedation orders again placed  Roselie Awkward, MD [place] [facility] Pager: [phone] Cell: [phone] If no response, call [phone]

## 2018-09-25 NOTE — Procedures (Signed)
Intubation Procedure Note SHAMAINE MULKERN [id number] January 29, 1941  Procedure: Intubation Indications: Respiratory insufficiency  Procedure Details Consent: Risks of procedure as well as the alternatives and risks of each were explained to the (patient/caregiver).  Consent for procedure obtained. Time Out: Verified patient identification, verified procedure, site/side was marked, verified correct patient position, special equipment/implants available, medications/allergies/relevent history reviewed, required imaging and test results available.  Performed  Drugs Etomidate 60m, versed 261m Rocuronium 5040mentanyl 88m55mv DL x 1 with MAC 4 blade Grade 1 view 7.5 ET tube passed through cords under direct visualization Placement confirmed with bilateral breath sounds, positive EtCO2 change and smoke in tube   Evaluation Hemodynamic Status: BP stable throughout; O2 sats: stable throughout Patient's Current Condition: stable Complications: No apparent complications Patient did tolerate procedure well. Chest X-ray ordered to verify placement.  CXR: pending.   DougSimonne Maffucci5/2020

## 2018-09-25 NOTE — Procedures (Signed)
Extubation Procedure Note  Patient Details:   Name: Sabrina Baker DOB: November 11, 1940 MRN: [id number]   Airway Documentation:    Vent end date: 09/25/18 Vent end time: 0930   Evaluation  O2 sats: stable throughout Complications: No apparent complications Patient did tolerate procedure well. Bilateral Breath Sounds: Diminished, Clear   Yes   Patient with positive leak prior to extubation.  Placed on NRB.  Patient able to vocalize post extubation, and patient has strong cough post extubation.  Will continue to monitor.  Sabrina Baker, [facility] 09/25/2018, 9:31 AM

## 2018-09-26 ENCOUNTER — Encounter (HOSPITAL_COMMUNITY): Payer: Self-pay

## 2018-09-26 ENCOUNTER — Inpatient Hospital Stay (HOSPITAL_COMMUNITY): Payer: Medicare Other

## 2018-09-26 DIAGNOSIS — J988 Other specified respiratory disorders: Secondary | ICD-10-CM

## 2018-09-26 DIAGNOSIS — J80 Acute respiratory distress syndrome: Secondary | ICD-10-CM

## 2018-09-26 LAB — BASIC METABOLIC PANEL
Anion gap: 10 (ref 5–15)
BUN: 93 mg/dL — ABNORMAL HIGH (ref 8–23)
CO2: 34 mmol/L — ABNORMAL HIGH (ref 22–32)
Calcium: 8.9 mg/dL (ref 8.9–10.3)
Chloride: 98 mmol/L (ref 98–111)
Creatinine, Ser: 1.9 mg/dL — ABNORMAL HIGH (ref 0.44–1.00)
GFR calc Af Amer: 29 mL/min — ABNORMAL LOW (ref >60)
GFR calc non Af Amer: 25 mL/min — ABNORMAL LOW (ref >60)
Glucose, Bld: 279 mg/dL — ABNORMAL HIGH (ref 70–99)
Potassium: 4.9 mmol/L (ref 3.5–5.1)
Sodium: 142 mmol/L (ref 135–145)

## 2018-09-26 LAB — PROTIME-INR
INR: 1.6 — ABNORMAL HIGH (ref 0.8–1.2)
Prothrombin Time: 18.5 seconds — ABNORMAL HIGH (ref 11.4–15.2)

## 2018-09-26 LAB — GLUCOSE, CAPILLARY
Glucose-Capillary: 217 mg/dL — ABNORMAL HIGH (ref 70–99)
Glucose-Capillary: 227 mg/dL — ABNORMAL HIGH (ref 70–99)
Glucose-Capillary: 273 mg/dL — ABNORMAL HIGH (ref 70–99)
Glucose-Capillary: 286 mg/dL — ABNORMAL HIGH (ref 70–99)
Glucose-Capillary: 313 mg/dL — ABNORMAL HIGH (ref 70–99)
Glucose-Capillary: 432 mg/dL — ABNORMAL HIGH (ref 70–99)

## 2018-09-26 LAB — CBC
HCT: 26.1 % — ABNORMAL LOW (ref 36.0–46.0)
Hemoglobin: 7.9 g/dL — ABNORMAL LOW (ref 12.0–15.0)
MCH: 30.7 pg (ref 26.0–34.0)
MCHC: 30.3 g/dL (ref 30.0–36.0)
MCV: 101.6 fL — ABNORMAL HIGH (ref 80.0–100.0)
Platelets: 460 10*3/uL — ABNORMAL HIGH (ref 150–400)
RBC: 2.57 MIL/uL — ABNORMAL LOW (ref 3.87–5.11)
RDW: 14.9 % (ref 11.5–15.5)
WBC: 18.7 10*3/uL — ABNORMAL HIGH (ref 4.0–10.5)
nRBC: 0 % (ref 0.0–0.2)

## 2018-09-26 LAB — PROCALCITONIN: Procalcitonin: 1.68 ng/mL

## 2018-09-26 MED ORDER — OSMOLITE 1.2 CAL PO LIQD
1000.0000 mL | ORAL | Status: DC
  Administered 2018-09-27 – 2018-09-29 (×3): 1000 mL
  Filled 2018-09-26 (×6): qty 1000

## 2018-09-26 MED ORDER — SODIUM CHLORIDE 0.9 % IV SOLN
1.0000 g | Freq: Three times a day (TID) | INTRAVENOUS | Status: AC
  Administered 2018-09-26 – 2018-10-03 (×23): 1 g via INTRAVENOUS
  Filled 2018-09-26 (×5): qty 1
  Filled 2018-09-26: qty 1000
  Filled 2018-09-26: qty 1
  Filled 2018-09-26: qty 1000
  Filled 2018-09-26 (×3): qty 1
  Filled 2018-09-26 (×3): qty 1000
  Filled 2018-09-26 (×3): qty 1
  Filled 2018-09-26: qty 1000
  Filled 2018-09-26: qty 1
  Filled 2018-09-26: qty 1000
  Filled 2018-09-26 (×6): qty 1

## 2018-09-26 MED ORDER — CHLORHEXIDINE GLUCONATE 0.12% ORAL RINSE (MEDLINE KIT)
15.0000 mL | Freq: Two times a day (BID) | OROMUCOSAL | Status: DC
  Administered 2018-09-26 – 2018-10-01 (×11): 15 mL via OROMUCOSAL

## 2018-09-26 MED ORDER — SODIUM CHLORIDE 0.9 % IV SOLN
1.0000 g | INTRAVENOUS | Status: DC
  Administered 2018-09-26: 1 g via INTRAVENOUS
  Filled 2018-09-26: qty 10
  Filled 2018-09-26: qty 1

## 2018-09-26 MED ORDER — INSULIN GLARGINE 100 UNIT/ML ~~LOC~~ SOLN
30.0000 [IU] | SUBCUTANEOUS | Status: DC
  Administered 2018-09-28: 30 [IU] via SUBCUTANEOUS
  Filled 2018-09-26 (×3): qty 0.3

## 2018-09-26 MED ORDER — INSULIN ASPART 100 UNIT/ML ~~LOC~~ SOLN
4.0000 [IU] | SUBCUTANEOUS | Status: DC
  Administered 2018-09-26 – 2018-09-30 (×24): 4 [IU] via SUBCUTANEOUS

## 2018-09-26 MED ORDER — SODIUM CHLORIDE 0.9 % IV SOLN
INTRAVENOUS | Status: DC | PRN
  Administered 2018-09-26: 13:00:00 250 mL via INTRAVENOUS
  Administered 2018-10-03 – 2018-10-07 (×2): 1000 mL via INTRAVENOUS

## 2018-09-26 MED ORDER — PRO-STAT SUGAR FREE PO LIQD
30.0000 mL | Freq: Three times a day (TID) | ORAL | Status: DC
  Administered 2018-09-26 – 2018-09-30 (×12): 30 mL
  Filled 2018-09-26 (×13): qty 30

## 2018-09-26 MED ORDER — ORAL CARE MOUTH RINSE
15.0000 mL | OROMUCOSAL | Status: DC
  Administered 2018-09-26 – 2018-10-01 (×54): 15 mL via OROMUCOSAL

## 2018-09-26 MED ORDER — INSULIN ASPART 100 UNIT/ML ~~LOC~~ SOLN
0.0000 [IU] | SUBCUTANEOUS | Status: DC
  Administered 2018-09-26: 8 [IU] via SUBCUTANEOUS
  Administered 2018-09-26: 5 [IU] via SUBCUTANEOUS
  Administered 2018-09-26: 11 [IU] via SUBCUTANEOUS
  Administered 2018-09-26: 8 [IU] via SUBCUTANEOUS
  Administered 2018-09-26: 15 [IU] via SUBCUTANEOUS
  Administered 2018-09-27 (×2): 5 [IU] via SUBCUTANEOUS
  Administered 2018-09-27: 2 [IU] via SUBCUTANEOUS
  Administered 2018-09-27: 3 [IU] via SUBCUTANEOUS
  Administered 2018-09-28 (×2): 8 [IU] via SUBCUTANEOUS
  Administered 2018-09-28: 9 [IU] via SUBCUTANEOUS
  Administered 2018-09-28: 5 [IU] via SUBCUTANEOUS
  Administered 2018-09-28 (×2): 8 [IU] via SUBCUTANEOUS
  Administered 2018-09-29: 3 [IU] via SUBCUTANEOUS
  Administered 2018-09-29: 5 [IU] via SUBCUTANEOUS
  Administered 2018-09-29: 8 [IU] via SUBCUTANEOUS
  Administered 2018-09-29 (×2): 5 [IU] via SUBCUTANEOUS
  Administered 2018-09-29: 8 [IU] via SUBCUTANEOUS
  Administered 2018-09-29 – 2018-09-30 (×3): 5 [IU] via SUBCUTANEOUS
  Administered 2018-09-30: 4 [IU] via SUBCUTANEOUS
  Administered 2018-09-30: 3 [IU] via SUBCUTANEOUS
  Administered 2018-10-01 (×2): 2 [IU] via SUBCUTANEOUS
  Administered 2018-10-02 (×3): 3 [IU] via SUBCUTANEOUS
  Administered 2018-10-03: 2 [IU] via SUBCUTANEOUS
  Administered 2018-10-03: 8 [IU] via SUBCUTANEOUS
  Administered 2018-10-03 (×2): 3 [IU] via SUBCUTANEOUS
  Administered 2018-10-03: 2 [IU] via SUBCUTANEOUS
  Administered 2018-10-03 (×2): 3 [IU] via SUBCUTANEOUS
  Administered 2018-10-04 (×2): 2 [IU] via SUBCUTANEOUS
  Administered 2018-10-04: 3 [IU] via SUBCUTANEOUS
  Administered 2018-10-04: 2 [IU] via SUBCUTANEOUS
  Administered 2018-10-04: 11 [IU] via SUBCUTANEOUS

## 2018-09-26 NOTE — Progress Notes (Signed)
Called pt daughter to update on pt; however, no answer. Will try calling again later.

## 2018-09-26 NOTE — Progress Notes (Signed)
Inpatient Diabetes Program Recommendations  AACE/ADA: New Consensus Statement on Inpatient Glycemic Control (2015)  Target Ranges:  Prepandial:   less than 140 mg/dL      Peak postprandial:   less than 180 mg/dL (1-2 hours)      Critically ill patients:  140 - 180 mg/dL   Lab Results  Component Value Date   GLUCAP 432 (H) 09/26/2018   HGBA1C 7.3 (H) 04/19/2012    Review of Glycemic Control Results for Sabrina Baker, Sabrina Baker (MRN [id number]) as of 09/26/2018 13:26  Ref. Range 09/25/2018 23:36 09/26/2018 03:24 09/26/2018 08:13 09/26/2018 11:31  Glucose-Capillary Latest Ref Range: 70 - 99 mg/dL 202 (H) 273 (H) 217 (H) 432 (H)    Inpatient Diabetes Program Recommendations:   Noted reintubated. Please consider: -Novolog 3-4 units q 4 hrs. Tube feed coverage (hold if tube feed stopped or held for any reason)  Thank you, Bethena Roys E. Jatorian Renault, RN, MSN, CDE  Diabetes Coordinator Inpatient Glycemic Control Team Team Pager [phone] (8am-5pm) 09/26/2018 1:27 PM

## 2018-09-26 NOTE — Progress Notes (Addendum)
PROGRESS NOTE                                                                                                                                                                                                             Patient Demographics:    Sabrina Baker, is a 78 y.o. female, DOB - [birth year], VZC:[id number]  Outpatient Primary MD for the patient is [name], MD    LOS - 9  Chief Complaint  Patient presents with  . Abdominal Pain  . Cough       Brief Narrative: Patient is a 78 y.o. female with PMHx of HTN, DM, CKD stage III, presented with shortness of breath and cough, found to have acute hypoxic respiratory failure due to ARDS in the setting of COVID-19 infection.  Significant hospital events: 3/30 Admitted, intubated 3/31>>4/5 Plaquenil 4/13 transfer to [facility] 4/15 Extubated 4/15 Stridor-cord edema-reintubated   Subjective:  Intubated-Sedated    Assessment  & Plan :  Acute Hypoxic Resp Failure due to Acute Covid 19 Viral Illness during the ongoing 2020 Covid 19 Pandemic: slowly improved and was extubated on 4/15-but then developed stridor 2/2 cord edema and was reintubated. Continue full vent support-and decadron. Lasix x1 today. PCCM following.   Fever on 4/15: afebrile-CXR with stable patchy opacities, blood/urine culture done on 4/15 pending.Has PICC line in place (placed 3/31). Continue to monitor off antibiotics for now.   Addendum Urine culture positive for Citrobacter and enterococcus- start Unasyn (discussed with pharmacy)-we will continue to follow final culture/sensitivity results..  Agitation/delirium: Stable continue- precedex infusion-prn Fentanyl/Versed  AKI on CKD stage III: AKI likely hemodynamically mediated-creatinine has improved-and now close to usual baseline.  Monitor electrolytes periodically.  Hypotension: continues to occur intermittently-on low dose Levophed.Titrate to MAP >65   PAF: Maintaining sinus rhythm-continue Eliquis.  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 Increase Lantus to 30 units, change SSI to moderate scale. Follow and adjust accordingly.  History of HTN: Continue to hold all antihypertensives-is hypotensive and on pressors  Depression: Resume Cymbalta when able.  Vent Settings: Vent Mode: PRVC FiO2 (%):  [40 %-100 %] 40 % Set Rate:  [14 bmp-18 bmp] 14 bmp  Vt Set:  [400 mL-420 mL] 420 mL PEEP:  [5 cmH20] 5 cmH20 Pressure Support:  [5 cmH20] 5 cmH20 Plateau Pressure:  [14 cmH20-15 cmH20] 15 cmH20  ABG:    Component Value Date/Time   PHART 7.447 09/25/2018 1624   PCO2ART 55.7 (H) 09/25/2018 1624   PO2ART 67.0 (L) 09/25/2018 1624   HCO3 38.5 (H) 09/25/2018 1624   TCO2 40 (H) 09/25/2018 1624   ACIDBASEDEF 2.0 09/11/2018 0401   O2SAT 93.0 09/25/2018 1624    Condition - Extremely Guarded  Family Communication: Left message for daughter on 4/16-subsequently spoke with son/daughter-in-law over the phone on 4/16  Code Status : Full code  Diet : N.p.o until she is more awake and alert  Disposition Plan : Remain in the ICU  Consults : PCCM  Procedures  :   3/30>> ETT 3/21>> RUE PICC 4/10>> A line-out 4/15>>extubated 4/15>>ETT (re-intubated)  GI prophylaxis: Protonix  DVT Prophylaxis  : Eliquis  Lab Results  Component Value Date   PLT 460 (H) 09/26/2018    Inpatient Medications  Scheduled Meds: . apixaban  5 mg Per Tube BID  . Chlorhexidine Gluconate Cloth  6 each Topical Daily  . dexamethasone  4 mg Intravenous Q6H  . feeding supplement (PRO-STAT SUGAR FREE 64)  30 mL Per Tube BID  . free water  300 mL Per Tube Q8H  . furosemide  40 mg Intravenous Daily  . gabapentin  100 mg Per Tube QHS  . insulin aspart  0-9 Units Subcutaneous Q4H  .  insulin glargine  22 Units Subcutaneous Q24H  . pantoprazole sodium  40 mg Per Tube Daily  . polyethylene glycol  17 g Per Tube Daily  . sodium chloride flush  10-40 mL Intracatheter Q12H   Continuous Infusions: . sodium chloride 10 mL/hr at 09/25/18 1900  . dexmedetomidine (PRECEDEX) IV infusion 1 mcg/kg/hr (09/26/18 0528)  . feeding supplement (OSMOLITE 1.2 CAL) 1,000 mL (09/25/18 2045)  . norepinephrine (LEVOPHED) Adult infusion 8 mcg/min (09/25/18 1905)   PRN Meds:.acetaminophen (TYLENOL) oral liquid 160 mg/5 mL, albuterol, fentaNYL (SUBLIMAZE) injection, fentaNYL (SUBLIMAZE) injection, hydrALAZINE, midazolam, midazolam, sodium chloride flush  Antibiotics  :    Anti-infectives (From admission, onward)   Start     Dose/Rate Route Frequency Ordered Stop   09/11/18 2200  hydroxychloroquine (PLAQUENIL) tablet 200 mg  Status:  Discontinued     200 mg Oral 2 times daily 09/10/18 2309 09/10/18 2319   09/11/18 2200  hydroxychloroquine (PLAQUENIL) tablet 200 mg     200 mg Per Tube 2 times daily 09/10/18 2319 09/15/18 0851   09/10/18 2330  hydroxychloroquine (PLAQUENIL) tablet 400 mg     400 mg Per Tube 2 times daily 09/10/18 2319 09/11/18 0823   09/10/18 2315  hydroxychloroquine (PLAQUENIL) tablet 400 mg  Status:  Discontinued     400 mg Oral 2 times daily 09/10/18 2309 09/10/18 2319   09/10/18 1300  azithromycin (ZITHROMAX) 500 mg in sodium chloride 0.9 % 250 mL IVPB     500 mg 250 mL/hr over 60 Minutes Intravenous Every 24 hours 09/09/18 1342 09/15/18 1531   09/10/18 1200  cefTRIAXone (ROCEPHIN) 1 g in sodium chloride 0.9 % 100 mL IVPB  Status:  Discontinued     1 g 200 mL/hr over 30 Minutes Intravenous Every 24 hours 09/09/18 1342 09/14/18 1436   09/09/18 1200  cefTRIAXone (ROCEPHIN) 1 g in sodium chloride 0.9 % 100 mL IVPB     1 g 200 mL/hr over 30 Minutes Intravenous  Once 09/09/18 1148 09/09/18 1248   09/09/18 1200  azithromycin (ZITHROMAX) 500 mg in sodium chloride 0.9 % 250 mL  IVPB     500 mg 250 mL/hr over 60 Minutes Intravenous  Once 09/09/18 1148 09/09/18 1351      Time spent 35 minutes   Oren Binet M.D on 09/26/2018 at 7:30 AM  To page go to [email or web address] - password [email or web address]  [facility] -  Office  [phone]  See all Orders from today for further details  Admit date - 09/09/2018    17    Objective:   Vitals:   09/26/18 0600 09/26/18 0615 09/26/18 0630 09/26/18 0700  BP: (!) 130/57 134/61 121/60 (!) 111/55  Pulse: 71 69 71 73  Resp: (!) 0 (!) 0 (!) 3 17  Temp:      TempSrc:      SpO2: 95% 95% 95% 92%  Weight:      Height:        Wt Readings from Last 3 Encounters:  09/23/18 75.1 kg  06/21/17 70.3 kg  01/24/16 67.1 kg     Intake/Output Summary (Last 24 hours) at 09/26/2018 0730 Last data filed at 09/26/2018 0700 Gross per 24 hour  Intake 3555.5 ml  Output 1545 ml  Net 2010.5 ml     Physical Exam General appearance: intubated,sedated-not in any distress HEENT: Atraumatic, normocephalic Neck: Supple Chest: Moving air bilaterally-no added sounds heard anteriorly CVS: S1-S2 regular no murmurs Abdomen: Soft nontender nondistended. Extremities: No edema Neurology: Difficult exam-as intubated and sedated-but per nursing staff-moves all 4 extremities when off sedation Skin: No rash   Data Review:    CBC Recent Labs  Lab 09/20/18 0525  09/21/18 0433 09/22/18 0254  09/22/18 0615 09/23/18 0430 09/24/18 1003 09/25/18 0407 09/25/18 1624 09/26/18 0440  WBC 13.1*  --  13.1* 12.4*  --  18.4* 12.5* 14.5*  --   --  18.7*  HGB 9.2*   < > 9.4* 9.2*   < > 9.7* 7.9* 7.9* 7.8* 9.2* 7.9*  HCT 31.0*   < > 31.2* 30.4*   < > 32.7* 26.4* 25.5* 23.0* 27.0* 26.1*  PLT 290  --  348 295  --  353 310 326  --   --  460*  MCV 100.0  --  99.0 98.1  --  98.5 98.1 100.0  --   --  101.6*  MCH 29.7  --  29.8 29.7  --  29.2 29.4 31.0  --   --  30.7  MCHC 29.7*  --  30.1 30.3  --  29.7* 29.9* 31.0  --   --  30.3  RDW 14.5  --  14.5 14.4   --  14.6 14.9 15.1  --   --  14.9  LYMPHSABS 1.2  --  1.1 1.0  --  1.1 0.9  --   --   --   --   MONOABS 1.0  --  0.9 0.8  --  1.1* 1.0  --   --   --   --   EOSABS 0.3  --  0.3 0.3  --  0.4 0.3  --   --   --   --   BASOSABS 0.0  --  0.0 0.0  --  0.1 0.0  --   --   --   --    < > = values in this interval not displayed.    [place]  Lab 09/20/18 0525 09/20/18 1041  09/21/18 0433  09/22/18 0615 09/23/18 0430 09/24/18 0500 09/25/18 0350 09/25/18 0407 09/25/18 1624 09/26/18 0440  NA 146*  --    < > 143   < > 141 145 142 143 140 142 142  K 4.5  --    < > 4.6   < > 4.9 4.1 4.4 4.4 4.5 4.1 4.9  CL 101  --   --  99  --  98 99 99 99  --   --  98  CO2 36*  --   --  32  --  31 36* 33* 35*  --   --  34*  GLUCOSE 134*  --   --  109*  --  187* 60* 123* 156*  --   --  279*  BUN 119*  --   --  111*  --  100* 93* 90* 86*  --   --  93*  CREATININE 1.81*  --   --  1.62*  --  1.83* 1.67* 1.63* 1.71*  --   --  1.90*  CALCIUM 10.4*  --   --  10.4*  --  10.1 10.0 9.6 9.1  --   --  8.9  MG 2.3  --   --  2.3  --  2.2 2.1  --   --   --   --   --   AST  --  22  --   --   --   --  37  --   --   --   --   --   ALT  --  12  --   --   --   --  21  --   --   --   --   --   ALKPHOS  --  39  --   --   --   --  35*  --   --   --   --   --   BILITOT  --  0.4  --   --   --   --  0.5  --   --   --   --   --    < > = values in this interval not displayed.   ------------------------------------------------------------------------------------------------------------------ No results for input(s): CHOL, HDL, LDLCALC, TRIG, CHOLHDL, LDLDIRECT in the last 72 hours.  Lab Results  Component Value Date   HGBA1C 7.3 (H) 04/19/2012   ------------------------------------------------------------------------------------------------------------------ No results for input(s): TSH, T4TOTAL, T3FREE, THYROIDAB in the last 72 hours.  Invalid input(s): FREET3  ------------------------------------------------------------------------------------------------------------------ No results for input(s): VITAMINB12, FOLATE, FERRITIN, TIBC, IRON, RETICCTPCT in the last 72 hours.  Coagulation profile No results for input(s): INR, PROTIME in the last 168 hours.  No results for input(s): DDIMER in the last 72 hours.  Cardiac Enzymes Recent Labs  Lab 09/20/18 1041 09/20/18 2222 09/21/18 0433  TROPONINI 0.03* <0.03 0.04*   ------------------------------------------------------------------------------------------------------------------    Component Value Date/Time   BNP 278.8 (H) 09/25/2018 0350    Micro Results Recent Results (from the past 240 hour(s))  Culture, Urine     Status: Abnormal   Collection Time: 09/22/18  4:00 PM  Result Value Ref Range Status   Specimen Description URINE, CATHETERIZED  Final   Special Requests   Final    Normal Performed at [facility], [place] [address], [place], [place] [address]    Culture (A)  Final    >=100,000 COLONIES/mL ENTEROCOCCUS FAECALIS 10,000 COLONIES/mL CITROBACTER FREUNDII    Report Status 09/24/2018 FINAL  Final  Organism ID, Bacteria ENTEROCOCCUS FAECALIS (A)  Final   Organism ID, Bacteria CITROBACTER FREUNDII (A)  Final      Susceptibility   Citrobacter freundii - MIC*    CEFAZOLIN >=64 RESISTANT Resistant     CEFTRIAXONE <=1 SENSITIVE Sensitive     CIPROFLOXACIN <=0.25 SENSITIVE Sensitive     GENTAMICIN <=1 SENSITIVE Sensitive     IMIPENEM <=0.25 SENSITIVE Sensitive     NITROFURANTOIN <=16 SENSITIVE Sensitive     TRIMETH/SULFA <=20 SENSITIVE Sensitive     PIP/TAZO <=4 SENSITIVE Sensitive     * 10,000 COLONIES/mL CITROBACTER FREUNDII   Enterococcus faecalis - MIC*    AMPICILLIN <=2 SENSITIVE Sensitive     LEVOFLOXACIN 2 SENSITIVE Sensitive     NITROFURANTOIN <=16 SENSITIVE Sensitive     VANCOMYCIN 2 SENSITIVE Sensitive     * >=100,000 COLONIES/mL ENTEROCOCCUS FAECALIS   Culture, respiratory (non-expectorated)     Status: None   Collection Time: 09/22/18  4:00 PM  Result Value Ref Range Status   Specimen Description TRACHEAL ASPIRATE  Final   Special Requests Normal  Final   Gram Stain   Final    MODERATE WBC PRESENT,BOTH PMN AND MONONUCLEAR MODERATE GRAM POSITIVE COCCI IN PAIRS IN CLUSTERS MODERATE GRAM NEGATIVE RODS Performed at [facility], [address] [place], [place] [address]    Culture FEW KLEBSIELLA PNEUMONIAE  Final   Report Status 09/25/2018 FINAL  Final   Organism ID, Bacteria KLEBSIELLA PNEUMONIAE  Final      Susceptibility   Klebsiella pneumoniae - MIC*    AMPICILLIN RESISTANT Resistant     CEFAZOLIN <=4 SENSITIVE Sensitive     CEFEPIME <=1 SENSITIVE Sensitive     CEFTAZIDIME <=1 SENSITIVE Sensitive     CEFTRIAXONE <=1 SENSITIVE Sensitive     CIPROFLOXACIN <=0.25 SENSITIVE Sensitive     GENTAMICIN <=1 SENSITIVE Sensitive     IMIPENEM <=0.25 SENSITIVE Sensitive     TRIMETH/SULFA <=20 SENSITIVE Sensitive     AMPICILLIN/SULBACTAM 4 SENSITIVE Sensitive     PIP/TAZO <=4 SENSITIVE Sensitive     Extended ESBL NEGATIVE Sensitive     * FEW KLEBSIELLA PNEUMONIAE  Culture, blood (routine x 2)     Status: None (Preliminary result)   Collection Time: 09/22/18  4:13 PM  Result Value Ref Range Status   Specimen Description BLOOD LEFT ANTECUBITAL  Final   Special Requests   Final    BOTTLES DRAWN AEROBIC ONLY Blood Culture adequate volume   Culture   Final    NO GROWTH 3 DAYS Performed at [facility], [address], [place], [place] [address]    Report Status PENDING  Incomplete  Culture, blood (routine x 2)     Status: None (Preliminary result)   Collection Time: 09/22/18  6:25 PM  Result Value Ref Range Status   Specimen Description BLOOD LEFT HAND  Final   Special Requests   Final    BOTTLES DRAWN AEROBIC AND ANAEROBIC Blood Culture adequate volume   Culture   Final    NO GROWTH 3 DAYS Performed at [facility], [place] [address] [place], [place] [address]    Report Status PENDING  Incomplete    Radiology Reports Dg Chest Port 1 View  Result Date: 09/25/2018 CLINICAL DATA:  78 year old female with a history of intubation EXAM: PORTABLE CHEST 1 VIEW COMPARISON:  09/24/2018 FINDINGS: Endotracheal tube now terminates at the carina. Tube may be withdrawn 6 cm-7 cm for better positioning. Right  upper extremity PICC. Gastric tube terminates out of the field of view. Low lung volumes with similar appearance of reticulonodular opacity throughout with increasing airspace opacity in the right mid lung. Persistently low lung volumes.  No pneumothorax or pleural effusion. IMPRESSION: Endotracheal tube terminates at the carina and may be withdrawn 6 cm-7 cm for better positioning. Low lung volumes persist with mixed interstitial and airspace opacities bilaterally, with increasing airspace opacity in the right mid lung. Unchanged right upper extremity PICC and gastric tube. Electronically Signed   By: Corrie Mckusick D.O.   On: 09/25/2018 19:11   Dg Chest Port 1 View  Result Date: 09/24/2018 CLINICAL DATA:  Short of breath EXAM: PORTABLE CHEST 1 VIEW COMPARISON:  09/23/2018 FINDINGS: Normal heart size. Endotracheal tube, NG tube, and right PICC are stable. Patchy airspace opacities throughout both lungs are not significantly changed. No pneumothorax. IMPRESSION: Stable patchy airspace disease throughout both lungs. Electronically Signed   By: Marybelle Killings M.D.   On: 09/24/2018 07:41   Dg Chest Port 1 View  Result Date: 09/23/2018 CLINICAL DATA:  Respiratory failure. EXAM: PORTABLE CHEST 1 VIEW COMPARISON:  One-view chest x-ray 09/21/2018 FINDINGS: The heart size is normal. Endotracheal tube is stable. NG tube courses off the inferior border the film. Right-sided PICC line is stable and in satisfactory position. Bilateral interstitial and airspace disease is similar the prior exam. There is improved aeration at the  left base. Increased airspace opacities are noted in the right middle and upper lobes. IMPRESSION: 1. Bilateral interstitial and airspace disease concerning for infection. There is slight increase on the right and some improvement on the left. 2. Support apparatus is stable. Electronically Signed   By: San Morelle M.D.   On: 09/23/2018 07:41   Dg Chest Port 1 View  Result Date: 09/21/2018 CLINICAL DATA:  Respiratory failure. EXAM: PORTABLE CHEST 1 VIEW COMPARISON:  Radiograph of September 21, 2018. FINDINGS: Stable cardiomediastinal silhouette. Endotracheal and nasogastric tubes are in grossly good position. Right-sided PICC line is unchanged. No pneumothorax or significant pleural effusion is noted. Stable bilateral lung opacities are noted concerning for pneumonia or possibly edema. Bony thorax is unremarkable. IMPRESSION: Stable support apparatus. Stable bilateral lung opacities as described above. Electronically Signed   By: Marijo Conception, M.D.   On: 09/21/2018 22:03   Dg Chest Port 1 View  Result Date: 09/21/2018 CLINICAL DATA:  Respiratory failure EXAM: PORTABLE CHEST 1 VIEW COMPARISON:  09/20/2018 FINDINGS: Endotracheal tube in good position. Right arm PICC tip in the proximal SVC unchanged. NG tube in the stomach. Mild improvement in bilateral airspace disease. Small left effusion. IMPRESSION: Endotracheal tube in good position Diffuse bilateral airspace disease with mild improvement. Electronically Signed   By: Franchot Gallo M.D.   On: 09/21/2018 08:10   Dg Chest Port 1 View  Result Date: 09/20/2018 CLINICAL DATA:  Respiratory failure.  Ventilator support. EXAM: PORTABLE CHEST 1 VIEW COMPARISON:  09/18/2018 FINDINGS: Endotracheal tube tip is 4 cm above the carina. Orogastric tube enters the abdomen. Bilateral patchy pulmonary infiltrates appear quite similar, with slightly more volume loss at the right lung base. No new finding otherwise. IMPRESSION: Persistent patchy bilateral pulmonary  infiltrates. Slight worsening of volume loss at the right lung base. Electronically Signed   By: Nelson Chimes M.D.   On: 09/20/2018 06:11   Dg Chest Port 1 View  Result Date: 09/18/2018 CLINICAL DATA:  ARDS, COVID+ Pt intubated on airborne and contact PPE: Gloves, gown, N95, goggles, bouffant. EXAM: PORTABLE  CHEST - 1 VIEW COMPARISON:  09/16/2018 FINDINGS: Endotracheal tube, nasogastric tube, right arm PICC stable. Slight improvement in right lower lung airspace disease with patchy hazy infiltrates in the left mid and lower lung and right upper lung as before. Heart size and mediastinal contours are within normal limits. No effusion. No pneumothorax. Fixation hardware in the proximal right humerus. IMPRESSION: 1. Patchy bilateral airspace disease, with slight improvement in right lower lung opacities since prior study. 2. Support hardware stable in position. Electronically Signed   By: Lucrezia Europe M.D.   On: 09/18/2018 08:05   Dg Chest Port 1 View  Result Date: 09/16/2018 CLINICAL DATA:  Respiratory failure EXAM: PORTABLE CHEST 1 VIEW COMPARISON:  Yesterday FINDINGS: Endotracheal tube tip between the clavicular heads and carina. The orogastric tube at least reaches the stomach. Right-sided central line with tip at the SVC. No appreciable change in patchy bilateral interstitial and airspace opacity. Stable heart size. No evidence of air leak or pleural fluid. IMPRESSION: 1. Unchanged multifocal pneumonia. 2. Stable hardware positioning. Electronically Signed   By: Monte Fantasia M.D.   On: 09/16/2018 07:09   Dg Chest Port 1 View  Result Date: 09/15/2018 CLINICAL DATA:  Respiratory failure. Intubated patient. Follow-up exam. EXAM: PORTABLE CHEST 1 VIEW COMPARISON:  Prior studies, most recent 09/14/2018 FINDINGS: Cardiac silhouette is normal size.  No mediastinal or hilar masses. There are hazy bilateral airspace lung opacities which are without change from the previous day's exam. No new lung abnormalities.  No convincing pleural effusion and no pneumothorax. Endotracheal tube, nasal/orogastric tube and right PICC are stable. IMPRESSION: 1. No change from previous day's study. 2. Persistent bilateral ground-glass lung opacities. 3. Stable well-positioned support apparatus. Electronically Signed   By: Lajean Manes M.D.   On: 09/15/2018 06:47   Dg Chest Port 1 View  Result Date: 09/14/2018 CLINICAL DATA:  Respiratory failure.  COVID-19. EXAM: PORTABLE CHEST 1 VIEW COMPARISON:  09/13/2018 and prior studies dating back to 09/09/2018. FINDINGS: Bilateral ground-glass lung opacities are without significant change from the prior exams. Atelectasis at the left lung base is stable. More confluent opacity at the right lung base is less prominent, which appears to be due to larger lung volumes on the current exam. No new lung abnormalities.  No pleural effusion or pneumothorax. Endotracheal tube, nasal/orogastric tube and right PICC are stable in well positioned. IMPRESSION: 1. No significant change from the previous day's study. 2. Persistent bilateral ground-glass lung opacities more confluent basilar opacities. 3. Stable well-positioned support apparatus. Electronically Signed   By: Lajean Manes M.D.   On: 09/14/2018 06:24   Dg Chest Port 1 View  Result Date: 09/13/2018 CLINICAL DATA:  Respiratory failure EXAM: PORTABLE CHEST 1 VIEW COMPARISON:  Yesterday FINDINGS: Endotracheal tube tip between the clavicular heads and carina. The orogastric tube has been advanced and now at least reaches the stomach-as confirmed on interval KUB. Right PICC with tip at the SVC. Patchy bilateral airspace disease. Borderline heart size. No effusion or pneumothorax. IMPRESSION: Stable hardware positioning and bilateral pneumonia. Electronically Signed   By: Monte Fantasia M.D.   On: 09/13/2018 06:08   Dg Chest Port 1 View  Result Date: 09/12/2018 CLINICAL DATA:  History of endotracheal tube EXAM: PORTABLE CHEST 1 VIEW COMPARISON:   Yesterday FINDINGS: Endotracheal tube tip between the clavicular heads and carina. The orogastric tube tip is in the region of the GE junction. Right PICC with tip near the SVC origin. Patchy bilateral lung opacity with both interstitial and  airspace appearance. No effusion or pneumothorax. Normal heart size. These results will be called to the ordering clinician or representative by the Radiologist Assistant, and communication documented in the PACS or zVision Dashboard. IMPRESSION: 1. Shortened orogastric tube with tip now at the GE junction. 2. Other hardware positioning is stable. 3. Bilateral lung opacity with progression. Electronically Signed   By: Monte Fantasia M.D.   On: 09/12/2018 07:14   Dg Chest Port 1 View  Result Date: 09/11/2018 CLINICAL DATA:  Respiratory failure EXAM: PORTABLE CHEST 1 VIEW COMPARISON:  09/10/2018 FINDINGS: Endotracheal tube and NG tube are stable. Interval placement of right PICC line with the tip in the SVC. Patchy bilateral airspace disease similar to prior study. Heart is normal size. No visible effusions or acute bony abnormality. IMPRESSION: Right PICC line tip in the SVC. Remainder of the support devices stable. Stable patchy bilateral airspace disease. Electronically Signed   By: Rolm Baptise M.D.   On: 09/11/2018 07:36   Dg Chest Port 1 View  Result Date: 09/10/2018 CLINICAL DATA:  Respiratory failure EXAM: PORTABLE CHEST 1 VIEW COMPARISON:  09/09/2018 FINDINGS: Support devices are stable. Heart is normal size. Patchy bilateral airspace disease again noted, not significantly changed. No visible effusions or acute bony abnormality. IMPRESSION: Patchy bilateral airspace disease, not significantly changed. Electronically Signed   By: Rolm Baptise M.D.   On: 09/10/2018 07:39   Dg Chest Port 1 View  Result Date: 09/09/2018 CLINICAL DATA:  Respiratory failure. EXAM: PORTABLE CHEST 1 VIEW COMPARISON:  Chest x-ray from same day at 1 a.m. FINDINGS: Interval placement of  an endotracheal tube with the tip 3.9 cm above the carina. Enteric tube entering the stomach with the tip below the field of view. The heart size and mediastinal contours are within normal limits. Patchy asymmetric, hazy opacities in both lungs again noted, slightly more consolidative appearance in the left mid lung and right lower lobe. No pleural effusion or pneumothorax. No acute osseous abnormality. IMPRESSION: 1. Appropriately positioned endotracheal tube. 2. Patchy asymmetric airspace disease in both lungs again noted, with slightly more focal consolidative appearance in the left mid lung and right lower lobe. Findings are nonspecific, but concerning for atypical infection, including potential viral pneumonia. Electronically Signed   By: Titus Dubin M.D.   On: 09/09/2018 17:38   Dg Chest Port 1 View  Result Date: 09/09/2018 CLINICAL DATA:  Short of breath, cough.  Diarrhea. EXAM: PORTABLE CHEST 1 VIEW COMPARISON:  Chest radiograph 05/29/2012 FINDINGS: Normal cardiac silhouette. There is bilateral fine airspace disease in the upper lobes. No pleural fluid. No pneumothorax. No acute osseous abnormality. RIGHT shoulder internal fixation. IMPRESSION: Bilateral upper lobe airspace disease representing multifocal pneumonia versus pulmonary edema. Electronically Signed   By: Suzy Bouchard M.D.   On: 09/09/2018 11:27   Dg Abd Portable 1v  Result Date: 09/25/2018 CLINICAL DATA:  78 y/o  F; intubation, NG placement. EXAM: PORTABLE ABDOMEN - 1 VIEW COMPARISON:  09/12/2018 abdomen radiographs. FINDINGS: Enteric tube tip projects over the gastric body. Normal bowel gas pattern. Ill-defined patchy opacities in lung bases. No acute osseous abnormality is evident. IMPRESSION: Enteric tube tip projects over the gastric body. Normal bowel gas pattern. Electronically Signed   By: Kristine Garbe M.D.   On: 09/25/2018 19:19   Dg Abd Portable 1v  Result Date: 09/12/2018 CLINICAL DATA:  Orogastric tube  placement. EXAM: PORTABLE ABDOMEN - 1 VIEW COMPARISON:  CT abdomen and pelvis June 24, 2018 FINDINGS: Nasogastric tube tip projects in  gastric antrum. No intra-abdominal mass effect or pathologic calcifications. Included bowel gas pattern is nondilated and nonobstructive. Mild vascular calcifications. Interstitial and alveolar airspace opacities included lung bases better characterized on today's dedicated radiograph. Thoracolumbar levoscoliosis. IMPRESSION: 1. Nasogastric tube tip projects in gastric antrum. Electronically Signed   By: Elon Alas M.D.   On: 09/12/2018 22:13   [place] Ekg Site Rite  Result Date: 09/10/2018 If Site Rite image not attached, placement could not be confirmed due to current cardiac rhythm.

## 2018-09-26 NOTE — Progress Notes (Signed)
Nutrition Follow-up RD working remotely.  DOCUMENTATION CODES:   Not applicable  INTERVENTION:   To better meet re-estimated needs:  Decrease Osmolite 1.2 to 40 ml/h (960 ml per day)  Increase Pro-stat to 30 ml TID  New regimen provides 1452 kcal, 98 gm protein, 787 ml free water daily  NUTRITION DIAGNOSIS:   Inadequate oral intake related to inability to eat as evidenced by NPO status.  Ongoing  GOAL:   Provide needs based on ASPEN/SCCM guidelines  Met with TF  MONITOR:   Vent status, TF tolerance, I & O's, Labs, Skin  ASSESSMENT:   78 yo female with PMH of DM, HLD, HTN, CKD-3, neuromuscular disorder, B12 deficiency, osteopenia who was admitted with progressive SOB requiring intubation.  Patient was extubated 4/15, but required re-intubation the same day. OGT in place, receiving Osmolite 1.2 at 50 ml/h with Pro-stat 30 ml BID providing 1640 kcal, 97 gm protein, 984 ml free water daily. Free water flushes 300 ml every 8 hours.   Patient is currently intubated on ventilator support MV: 6.7 L/min Temp (24hrs), Avg:98.6 F (37 C), Min:97.6 F (36.4 C), Max:99.7 F (37.6 C) MAP 75 (range >60 since 3am)  Labs reviewed. BUN 93 (H), creatinine 1.9 (H) CBG's: 273-217  Medications reviewed and include Lasix, Novolog, Lantus, Decadron, Precedex, Levophed (decreasing rate).    Diet Order:   Diet Order            Diet NPO time specified  Diet effective now              EDUCATION NEEDS:   No education needs have been identified at this time  Skin:  Skin Assessment: Reviewed RN Assessment  Last BM:  4/15 (type 7)  Height:   Ht Readings from Last 1 Encounters:  09/09/18 _0  (1.6 m)    Weight:   Wt Readings from Last 1 Encounters:  09/23/18 75.1 kg    Ideal Body Weight:  52.3 kg  BMI:  Body mass index is 29.33 kg/m.  Estimated Nutritional Needs:   Kcal:  1450  Protein:  90-110 gm  Fluid:  1.6 L    Molli Barrows, RD, LDN,  [place] Pager [phone] After Hours Pager [phone]

## 2018-09-26 NOTE — Progress Notes (Signed)
NAME:  Sabrina Baker, MRN:  [id number], DOB:  April 14, 1941, LOS: 32 ADMISSION DATE:  09/09/2018, CONSULTATION DATE:  09/09/18 REFERRING MD: Regenia Skeeter, CHIEF COMPLAINT:  SOB, diarrhea    Brief History   78 y/o F admitted 3/30 from home with c/o of cough, SOB, and abdominal pain x 2 weeks. Progressive hypoxia in ER now on NRB and CXR with bilateral infiltrates. Intubated.  COVID Positive.     Significant Hospital Events   3/30 Admitted, intubated 4/08 PSV 4 hours failed SBT due to agitation, CXR with improved aeration  4/09 Failed wean due to agitation 4/10 Hypotensive despite decreased sedation, levophed initiated 4/11 Weaned 4 hours 4/12 PSV 10/8  Consults:  Palliative care 4/5  Procedures:  3/30 ETT >>  RUE PICC 3/31 >>  ALine 4/10 >> out  Significant Diagnostic Tests:      Micro Data:  3/30 BC x 2 > negative 3/30 RVP > neg 3/30 COVID-19 >> positive 3/30 Trach asp >> normal flora 3/30 urine strep >> negative  3/30 urine legionella >> negative  Antimicrobials:  3/30 azithro >> completed  3/30 ceftriaxone >> completed 09/10/2018 Plaquenil >> completed  Interim history/subjective:  Intubated overnight  Objective   Blood pressure 122/60, pulse 70, temperature 97.6 F (36.4 C), temperature source Axillary, resp. rate 17, height 5\' 3"  (1.6 m), weight 75.1 kg, SpO2 94 %.    Vent Mode: PRVC FiO2 (%):  [40 %-100 %] 40 % Set Rate:  [14 bmp-18 bmp] 14 bmp Vt Set:  [400 mL-420 mL] 420 mL PEEP:  [5 cmH20] 5 cmH20 Pressure Support:  [12 cmH20] 12 cmH20 Plateau Pressure:  [14 cmH20-15 cmH20] 15 cmH20   Intake/Output Summary (Last 24 hours) at 09/26/2018 1415 Last data filed at 09/26/2018 1400 Gross per 24 hour  Intake 4155.42 ml  Output 1175 ml  Net 2980.42 ml    Physical Exam  General:  Acutely ill appearing female, NAD HENT: [place]/AT, PERRL, EOM-I and ETT in place PULM: CTA bilaterally CV: RRR, Nl S1/S2 and -M/R/G GI: BS+, soft, nontender MSK: normal bulk and tone  Neuro: sleepy on vent, follows comamnds     Resolved Hospital Problem list     Assessment & Plan:   Acute Hypoxemic Respiratory Failure secondary to SARS-CoV-2 ARDS : Oxygenation improved, chest x-ray about the same P: Maintain intubated for now Adjust vent for ABG CXR to PRN Will likely need a tracheostomy Aspiration precautions Titrate O2 for sat of 88-92%  Agitated Delirium significant anxiety: Improved Sedation Needs on Mechanical Ventilation : Resolved P: Sedation as ordered  Hypotension/shock overnight: Agree with obtaining blood cultures, chest x-ray Titrate Levophed as needed to maintain mean arterial pressure greater than 65 Low threshold to start antibiotics depending on findings from chest x-ray  Best practice:  Diet: NPO Pain/Anxiety/Delirium protocol (if indicated): in place  VAP protocol (if indicated): Yes DVT prophylaxis: Eliquis   GI prophylaxis: protonix  Glucose control: Lantus, SSI  Mobility: bedrest Code Status: full Family Communication: TRH  Disposition: ICU   Labs   CBC: Recent Labs  Lab 09/20/18 0525  09/21/18 0433 09/22/18 0254  09/22/18 0615 09/23/18 0430 09/24/18 1003 09/25/18 0407 09/25/18 1624 09/26/18 0440  WBC 13.1*  --  13.1* 12.4*  --  18.4* 12.5* 14.5*  --   --  18.7*  NEUTROABS 10.5*  --  10.7* 10.3*  --  15.7* 10.2*  --   --   --   --   HGB 9.2*   < > 9.4* 9.2*   < >  9.7* 7.9* 7.9* 7.8* 9.2* 7.9*  HCT 31.0*   < > 31.2* 30.4*   < > 32.7* 26.4* 25.5* 23.0* 27.0* 26.1*  MCV 100.0  --  99.0 98.1  --  98.5 98.1 100.0  --   --  101.6*  PLT 290  --  348 295  --  353 310 326  --   --  460*   < > = values in this interval not displayed.    Basic Metabolic Panel: Recent Labs  Lab 09/20/18 0525  09/21/18 0433 09/22/18 0254  09/22/18 0615 09/23/18 0430 09/24/18 0500 09/25/18 0350 09/25/18 0407 09/25/18 1624 09/26/18 0440  NA 146*   < > 143  --    < > 141 145 142 143 140 142 142  K 4.5   < > 4.6  --    < > 4.9 4.1  4.4 4.4 4.5 4.1 4.9  CL 101  --  99  --   --  98 99 99 99  --   --  98  CO2 36*  --  32  --   --  31 36* 33* 35*  --   --  34*  GLUCOSE 134*  --  109*  --   --  187* 60* 123* 156*  --   --  279*  BUN 119*  --  111*  --   --  100* 93* 90* 86*  --   --  93*  CREATININE 1.81*  --  1.62*  --   --  1.83* 1.67* 1.63* 1.71*  --   --  1.90*  CALCIUM 10.4*  --  10.4*  --   --  10.1 10.0 9.6 9.1  --   --  8.9  MG 2.3  --  2.3  --   --  2.2 2.1  --   --   --   --   --   PHOS 5.8*  --  6.3* 6.3*  --  5.8* 5.5*  --   --   --   --   --    < > = values in this interval not displayed.   GFR: Estimated Creatinine Clearance: 24.1 mL/min (A) (by C-G formula based on SCr of 1.9 mg/dL (H)). Recent Labs  Lab 09/22/18 0615 09/23/18 0430 09/24/18 1003 09/25/18 0340 09/26/18 0440  PROCALCITON  --  0.55  --  0.56 1.68  WBC 18.4* 12.5* 14.5*  --  18.7*    Liver Function Tests: Recent Labs  Lab 09/20/18 1041 09/23/18 0430  AST 22 37  ALT 12 21  ALKPHOS 39 35*  BILITOT 0.4 0.5  PROT 5.4* 5.3*  ALBUMIN 1.7* 1.6*   No results for input(s): LIPASE, AMYLASE in the last 168 hours. No results for input(s): AMMONIA in the last 168 hours.  ABG    Component Value Date/Time   PHART 7.447 09/25/2018 1624   PCO2ART 55.7 (H) 09/25/2018 1624   PO2ART 67.0 (L) 09/25/2018 1624   HCO3 38.5 (H) 09/25/2018 1624   TCO2 40 (H) 09/25/2018 1624   ACIDBASEDEF 2.0 09/11/2018 0401   O2SAT 93.0 09/25/2018 1624     Coagulation Profile: Recent Labs  Lab 09/26/18 1200  INR 1.6*    Cardiac Enzymes: Recent Labs  Lab 09/20/18 1041 09/20/18 2222 09/21/18 0433 09/22/18 0254  CKTOTAL  --   --   --  11*  TROPONINI 0.03* <0.03 0.04*  --     HbA1C: Hgb A1c MFr Bld  Date/Time Value Ref  Range Status  04/19/2012 03:43 PM 7.3 (H) <5.7 % Final    Comment:    (NOTE)                                                                       According to the ADA Clinical Practice Recommendations for 2011, when HbA1c is  used as a screening test:  >=6.5%   Diagnostic of Diabetes Mellitus           (if abnormal result is confirmed) 5.7-6.4%   Increased risk of developing Diabetes Mellitus References:Diagnosis and Classification of Diabetes Mellitus,Diabetes Care,2011,34(Suppl 1):S62-S69 and Standards of Medical Care in         Diabetes - 2011,Diabetes PHXT,0569,79 (Suppl 1):S11-S61.  04/18/2012 09:20 AM 7.3 (H) <5.7 % Final    Comment:    (NOTE)                                                                       According to the ADA Clinical Practice Recommendations for 2011, when HbA1c is used as a screening test:  >=6.5%   Diagnostic of Diabetes Mellitus           (if abnormal result is confirmed) 5.7-6.4%   Increased risk of developing Diabetes Mellitus References:Diagnosis and Classification of Diabetes Mellitus,Diabetes YIAX,6553,74(MOLMB 1):S62-S69 and Standards of Medical Care in         Diabetes - 2011,Diabetes EMLJ,4492,01 (Suppl 1):S11-S61.    CBG: Recent Labs  Lab 09/25/18 1949 09/25/18 2336 09/26/18 0324 09/26/18 0813 09/26/18 1131  GLUCAP 89 202* 273* 217* 432*    The patient is critically ill with multiple organ systems failure and requires high complexity decision making for assessment and support, frequent evaluation and titration of therapies, application of advanced monitoring technologies and extensive interpretation of multiple databases.   Critical Care Time devoted to patient care services described in this note is  32  Minutes. This time reflects time of care of this signee Dr Jennet Maduro. This critical care time does not reflect procedure time, or teaching time or supervisory time of PA/NP/Med student/Med Resident etc but could involve care discussion time.  Rush Farmer, M.D. [facility] Pulmonary/Critical Care Medicine. Pager: [phone]. After hours pager: [phone].

## 2018-09-26 NOTE — Progress Notes (Signed)
Spoke with son Simona Huh to provide update. Simona Huh provided consent for tracheostomy with this RN and Olga Millers RN. Procedure planned for am. Will update family daily.

## 2018-09-26 NOTE — Progress Notes (Signed)
Dr. Sloan Leiter aware CBG >400. See assoc orders.

## 2018-09-27 LAB — BASIC METABOLIC PANEL
Anion gap: 11 (ref 5–15)
BUN: 97 mg/dL — ABNORMAL HIGH (ref 8–23)
CO2: 33 mmol/L — ABNORMAL HIGH (ref 22–32)
Calcium: 9 mg/dL (ref 8.9–10.3)
Chloride: 99 mmol/L (ref 98–111)
Creatinine, Ser: 1.6 mg/dL — ABNORMAL HIGH (ref 0.44–1.00)
GFR calc Af Amer: 36 mL/min — ABNORMAL LOW (ref >60)
GFR calc non Af Amer: 31 mL/min — ABNORMAL LOW (ref >60)
Glucose, Bld: 191 mg/dL — ABNORMAL HIGH (ref 70–99)
Potassium: 3.6 mmol/L (ref 3.5–5.1)
Sodium: 143 mmol/L (ref 135–145)

## 2018-09-27 LAB — CBC
HCT: 25.5 % — ABNORMAL LOW (ref 36.0–46.0)
Hemoglobin: 7.9 g/dL — ABNORMAL LOW (ref 12.0–15.0)
MCH: 31 pg (ref 26.0–34.0)
MCHC: 31 g/dL (ref 30.0–36.0)
MCV: 100 fL (ref 80.0–100.0)
Platelets: 460 10*3/uL — ABNORMAL HIGH (ref 150–400)
RBC: 2.55 MIL/uL — ABNORMAL LOW (ref 3.87–5.11)
RDW: 14.4 % (ref 11.5–15.5)
WBC: 18.1 10*3/uL — ABNORMAL HIGH (ref 4.0–10.5)
nRBC: 0 % (ref 0.0–0.2)

## 2018-09-27 LAB — GLUCOSE, CAPILLARY
Glucose-Capillary: 194 mg/dL — ABNORMAL HIGH (ref 70–99)
Glucose-Capillary: 99 mg/dL (ref 70–99)
Glucose-Capillary: 142 mg/dL — ABNORMAL HIGH (ref 70–99)
Glucose-Capillary: 211 mg/dL — ABNORMAL HIGH (ref 70–99)
Glucose-Capillary: 220 mg/dL — ABNORMAL HIGH (ref 70–99)

## 2018-09-27 LAB — CULTURE, BLOOD (ROUTINE X 2)
Culture: NO GROWTH
Culture: NO GROWTH
Special Requests: ADEQUATE
Special Requests: ADEQUATE

## 2018-09-27 LAB — URINE CULTURE: Culture: >=100000 — AB

## 2018-09-27 LAB — TSH: TSH: 0.551 u[IU]/mL (ref 0.350–4.500)

## 2018-09-27 MED ORDER — POTASSIUM CHLORIDE 20 MEQ/15ML (10%) PO SOLN
40.0000 meq | Freq: Once | ORAL | Status: AC
  Administered 2018-09-27: 40 meq
  Filled 2018-09-27: qty 30

## 2018-09-27 MED ORDER — CLONAZEPAM 0.5 MG PO TBDP
1.0000 mg | ORAL_TABLET | Freq: Two times a day (BID) | ORAL | Status: DC
  Administered 2018-09-27 – 2018-09-28 (×3): 1 mg via ORAL
  Filled 2018-09-27 (×3): qty 2

## 2018-09-27 MED ORDER — FUROSEMIDE 10 MG/ML IJ SOLN
40.0000 mg | Freq: Four times a day (QID) | INTRAMUSCULAR | Status: AC
  Administered 2018-09-27 (×2): 40 mg via INTRAVENOUS
  Filled 2018-09-27 (×2): qty 4

## 2018-09-27 MED ORDER — FUROSEMIDE 10 MG/ML IJ SOLN: 40.0000 mg | Freq: Four times a day (QID) | INTRAMUSCULAR | Status: DC

## 2018-09-27 MED ORDER — POTASSIUM CHLORIDE 20 MEQ/15ML (10%) PO SOLN
40.0000 meq | Freq: Three times a day (TID) | ORAL | Status: AC
  Administered 2018-09-27: 40 meq
  Filled 2018-09-27: qty 30

## 2018-09-27 MED ORDER — DEXAMETHASONE SODIUM PHOSPHATE 10 MG/ML IJ SOLN
4.0000 mg | Freq: Three times a day (TID) | INTRAMUSCULAR | Status: AC
  Administered 2018-09-27 – 2018-10-01 (×14): 4 mg via INTRAVENOUS
  Filled 2018-09-27 (×14): qty 1

## 2018-09-27 MED ORDER — QUETIAPINE FUMARATE 50 MG PO TABS
50.0000 mg | ORAL_TABLET | Freq: Two times a day (BID) | ORAL | Status: DC
  Administered 2018-09-27 – 2018-10-04 (×14): 50 mg via ORAL
  Filled 2018-09-27 (×15): qty 1

## 2018-09-27 MED ORDER — FUROSEMIDE 10 MG/ML IJ SOLN
40.0000 mg | Freq: Two times a day (BID) | INTRAMUSCULAR | Status: DC
  Administered 2018-09-27: 40 mg via INTRAVENOUS
  Filled 2018-09-27: qty 4

## 2018-09-27 MED ORDER — SODIUM CHLORIDE 0.9 % IV SOLN
2.0000 g | INTRAVENOUS | Status: AC
  Administered 2018-09-27 – 2018-10-03 (×7): 2 g via INTRAVENOUS
  Filled 2018-09-27 (×7): qty 20

## 2018-09-27 NOTE — Progress Notes (Signed)
NAME:  Sabrina Baker, MRN:  [id number], DOB:  12-12-1940, LOS: 38 ADMISSION DATE:  09/09/2018, CONSULTATION DATE:  09/09/18 REFERRING MD: Regenia Skeeter, CHIEF COMPLAINT:  SOB, diarrhea    Brief History   78 y/o F admitted 3/30 from home with c/o of cough, SOB, and abdominal pain x 2 weeks. Progressive hypoxia in ER now on NRB and CXR with bilateral infiltrates. Intubated.  COVID Positive.     Significant Hospital Events   3/30 Admitted, intubated 4/08 PSV 4 hours failed SBT due to agitation, CXR with improved aeration  4/09 Failed wean due to agitation 4/10 Hypotensive despite decreased sedation, levophed initiated 4/11 Weaned 4 hours 4/12 PSV 10/8  Consults:  Palliative care 4/5  Procedures:  3/30 ETT >>  RUE PICC 3/31 >>  ALine 4/10 >> out  Significant Diagnostic Tests:      Micro Data:  3/30 BC x 2 > negative 3/30 RVP > neg 3/30 COVID-19 >> positive 3/30 Trach asp >> normal flora 3/30 urine strep >> negative  3/30 urine legionella >> negative  Antimicrobials:  3/30 azithro >> completed  3/30 ceftriaxone >> completed 09/10/2018 Plaquenil >> completed  Interim history/subjective:  No events overnight, weaning some this AM  Objective   Blood pressure 139/64, pulse (!) 52, temperature (!) 93.4 F (34.1 C), resp. rate 15, height 5\' 3"  (1.6 m), weight 75.1 kg, SpO2 99 %.    Vent Mode: PRVC FiO2 (%):  [40 %] 40 % Set Rate:  [14 bmp] 14 bmp Vt Set:  [420 mL] 420 mL PEEP:  [5 cmH20] 5 cmH20 Plateau Pressure:  [12 cmH20-15 cmH20] 13 cmH20   Intake/Output Summary (Last 24 hours) at 09/27/2018 1006 Last data filed at 09/27/2018 0700 Gross per 24 hour  Intake 2487.74 ml  Output 1760 ml  Net 727.74 ml    Physical Exam  General:  Ill appearing female, on a high PS wean HENT: /AT, PERRL, EOM-I and MMM PULM: CTA bilaterally CV: RRR, Nl S1/S2 and -M/R/G GI: Soft, NT, ND and +BS MSK: normal bulk and tone Neuro: Sedate but arousable  Resolved Hospital Problem list      Assessment & Plan:   Acute Hypoxemic Respiratory Failure secondary to SARS-CoV-2 ARDS : Oxygenation improved, chest x-ray about the same P: Maintain intubated for now Begin PS trials but no extubation given no cuff leak Adjust vent for ABG CXR to PRN at this point Will likely need a tracheostomy next week, hopefully supplies will be available then Titrate O2 for sat of 88-92%  Agitated Delirium significant anxiety: Improved Sedation Needs on Mechanical Ventilation : Resolved P: Sedation as ordered D/C versed PRN Give additional doses of lasix  Hypotension/shock overnight: Titrate Levophed as needed to maintain mean arterial pressure greater than 65 Low threshold to start antibiotics depending on findings from chest x-ray Hypothermic, will check TSH  Best practice:  Diet: NPO Pain/Anxiety/Delirium protocol (if indicated): in place  VAP protocol (if indicated): Yes DVT prophylaxis: Eliquis   GI prophylaxis: protonix  Glucose control: Lantus, SSI  Mobility: bedrest Code Status: full Family Communication: TRH  Disposition: ICU   Labs   CBC: Recent Labs  Lab 09/21/18 0433 09/22/18 0254  09/22/18 0615 09/23/18 0430 09/24/18 1003 09/25/18 0407 09/25/18 1624 09/26/18 0440 09/27/18 0430  WBC 13.1* 12.4*  --  18.4* 12.5* 14.5*  --   --  18.7* 18.1*  NEUTROABS 10.7* 10.3*  --  15.7* 10.2*  --   --   --   --   --  HGB 9.4* 9.2*   < > 9.7* 7.9* 7.9* 7.8* 9.2* 7.9* 7.9*  HCT 31.2* 30.4*   < > 32.7* 26.4* 25.5* 23.0* 27.0* 26.1* 25.5*  MCV 99.0 98.1  --  98.5 98.1 100.0  --   --  101.6* 100.0  PLT 348 295  --  353 310 326  --   --  460* 460*   < > = values in this interval not displayed.    Basic Metabolic Panel: Recent Labs  Lab 09/21/18 0433 09/22/18 0254  09/22/18 0615 09/23/18 0430 09/24/18 0500 09/25/18 0350 09/25/18 0407 09/25/18 1624 09/26/18 0440 09/27/18 0430  NA 143  --    < > 141 145 142 143 140 142 142 143  K 4.6  --    < > 4.9 4.1 4.4 4.4  4.5 4.1 4.9 3.6  CL 99  --   --  98 99 99 99  --   --  98 99  CO2 32  --   --  31 36* 33* 35*  --   --  34* 33*  GLUCOSE 109*  --   --  187* 60* 123* 156*  --   --  279* 191*  BUN 111*  --   --  100* 93* 90* 86*  --   --  93* 97*  CREATININE 1.62*  --   --  1.83* 1.67* 1.63* 1.71*  --   --  1.90* 1.60*  CALCIUM 10.4*  --   --  10.1 10.0 9.6 9.1  --   --  8.9 9.0  MG 2.3  --   --  2.2 2.1  --   --   --   --   --   --   PHOS 6.3* 6.3*  --  5.8* 5.5*  --   --   --   --   --   --    < > = values in this interval not displayed.   GFR: Estimated Creatinine Clearance: 28.6 mL/min (A) (by C-G formula based on SCr of 1.6 mg/dL (H)). Recent Labs  Lab 09/23/18 0430 09/24/18 1003 09/25/18 0340 09/26/18 0440 09/27/18 0430  PROCALCITON 0.55  --  0.56 1.68  --   WBC 12.5* 14.5*  --  18.7* 18.1*    Liver Function Tests: Recent Labs  Lab 09/20/18 1041 09/23/18 0430  AST 22 37  ALT 12 21  ALKPHOS 39 35*  BILITOT 0.4 0.5  PROT 5.4* 5.3*  ALBUMIN 1.7* 1.6*   No results for input(s): LIPASE, AMYLASE in the last 168 hours. No results for input(s): AMMONIA in the last 168 hours.  ABG    Component Value Date/Time   PHART 7.447 09/25/2018 1624   PCO2ART 55.7 (H) 09/25/2018 1624   PO2ART 67.0 (L) 09/25/2018 1624   HCO3 38.5 (H) 09/25/2018 1624   TCO2 40 (H) 09/25/2018 1624   ACIDBASEDEF 2.0 09/11/2018 0401   O2SAT 93.0 09/25/2018 1624     Coagulation Profile: Recent Labs  Lab 09/26/18 1200  INR 1.6*    Cardiac Enzymes: Recent Labs  Lab 09/20/18 1041 09/20/18 2222 09/21/18 0433 09/22/18 0254  CKTOTAL  --   --   --  11*  TROPONINI 0.03* <0.03 0.04*  --     HbA1C: Hgb A1c MFr Bld  Date/Time Value Ref Range Status  04/19/2012 03:43 PM 7.3 (H) <5.7 % Final    Comment:    (NOTE)  According to the ADA Clinical Practice Recommendations for 2011, when HbA1c is used as a screening test:  >=6.5%   Diagnostic  of Diabetes Mellitus           (if abnormal result is confirmed) 5.7-6.4%   Increased risk of developing Diabetes Mellitus References:Diagnosis and Classification of Diabetes Mellitus,Diabetes Care,2011,34(Suppl 1):S62-S69 and Standards of Medical Care in         Diabetes - 2011,Diabetes ZTIW,5809,98 (Suppl 1):S11-S61.  04/18/2012 09:20 AM 7.3 (H) <5.7 % Final    Comment:    (NOTE)                                                                       According to the ADA Clinical Practice Recommendations for 2011, when HbA1c is used as a screening test:  >=6.5%   Diagnostic of Diabetes Mellitus           (if abnormal result is confirmed) 5.7-6.4%   Increased risk of developing Diabetes Mellitus References:Diagnosis and Classification of Diabetes Mellitus,Diabetes PJAS,5053,97(QBHAL 1):S62-S69 and Standards of Medical Care in         Diabetes - 2011,Diabetes Care,2011,34 (Suppl 1):S11-S61.    CBG: Recent Labs  Lab 09/26/18 1538 09/26/18 2011 09/26/18 2330 09/27/18 0352 09/27/18 0830  GLUCAP 313* 286* 227* 194* 99   The patient is critically ill with multiple organ systems failure and requires high complexity decision making for assessment and support, frequent evaluation and titration of therapies, application of advanced monitoring technologies and extensive interpretation of multiple databases.   Critical Care Time devoted to patient care services described in this note is  31  Minutes. This time reflects time of care of this signee Dr Jennet Maduro. This critical care time does not reflect procedure time, or teaching time or supervisory time of PA/NP/Med student/Med Resident etc but could involve care discussion time.  Rush Farmer, M.D. [facility] Pulmonary/Critical Care Medicine. Pager: [phone]. After hours pager: [phone].

## 2018-09-27 NOTE — Progress Notes (Signed)
PROGRESS NOTE                                                                                                                                                                                                             Patient Demographics:    Sabrina Baker, is a 78 y.o. female, DOB - Oct 13, 1940, FFM:[id number]  Outpatient Primary MD for the patient is [name], MD    LOS - 33  Chief Complaint  Patient presents with   Abdominal Pain   Cough   Note-today's round was via telemedicince-PCCM MD has physically rounded on the patient.  Brief Narrative: Patient is a 78 y.o. female with PMHx of HTN, DM, CKD stage III, presented with shortness of breath and cough, found to have acute hypoxic respiratory failure due to ARDS in the setting of COVID-19 infection.  Significant hospital events: 3/30 Admitted, intubated 3/31>>4/5 Plaquenil 4/13 transfer to [facility] 4/15 Extubated 4/15 Stridor-cord edema-reintubated   Subjective:  Intubated-failed SBT this morning when off sedation    Assessment  & Plan :  Acute Hypoxic Resp Failure due to Acute Covid 19 Viral Illness during the ongoing 2020 Covid 19 Pandemic: slowly improved and was extubated on 4/15-but then developed stridor 2/2 cord edema and was reintubated the same day.Unable to tolerate SBT today due to agitation when off sedation-will start Klonopin/Seroquel and re-attempt tomorrow. Lasix 40 mg x2 today-will attempt try to keep in neg balance as 6L+ so far. Continue full vent support-hypothermic this morning-restart tapering decadron (was on for cord edema). Re-assess tomorrow for SBT. PCCM following.   Fever on 4/15: Hypothermic this morning-etiology uncertain-was on high dose steroids for cord edema-not sure if developed relative adrenal insufficiency-restart Decadron-will taper slowly over the next few days. Check TSH-but may represent sick euthyroid syndrome. Supportive  care with warming blanket.Blood culture 4/15 neg so far-urine culture + for enterococcus/citrobacter-on ampicillin/rocephin since 4/16 (plan 1 week course).Has PICC line in place (placed 3/31).   Agitation/delirium: unable to tolerate SBT due to agitation/anxiety-starting Klonopin/Seroquel  AKI on CKD stage III: AKI likely hemodynamically mediated-creatinine has improved-and now close to usual baseline.  Monitor electrolytes periodically.  Hypotension: continues to occur intermittently-not sure if this is secondary to sepsis from UTI or from sedating agents.Briefly on low dose Levophed-but has been titrated off.  PAF: Maintaining sinus rhythm-continue Eliquis.  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 Continue Lantus to 30 units, SSI  moderate scale. Follow and adjust accordingly.  History of HTN: Continue to hold all antihypertensives  Depression: Resume Cymbalta when able.  Vent Settings: Vent Mode: PSV;CPAP FiO2 (%):  [40 %] 40 % Set Rate:  [14 bmp] 14 bmp Vt Set:  [420 mL] 420 mL PEEP:  [5 cmH20] 5 cmH20 Plateau Pressure:  [12 cmH20-15 cmH20] 13 cmH20  ABG:    Component Value Date/Time   PHART 7.447 09/25/2018 1624   PCO2ART 55.7 (H) 09/25/2018 1624   PO2ART 67.0 (L) 09/25/2018 1624   HCO3 38.5 (H) 09/25/2018 1624   TCO2 40 (H) 09/25/2018 1624   ACIDBASEDEF 2.0 09/11/2018 0401   O2SAT 93.0 09/25/2018 1624    Condition - Extremely Guarded  Family Communication: Unable to reach daughter on the phone-hence spoke with son and daughter-in-law on 4/17.  Code Status : Full code-have asked family to start DNR discussion-we will reach out to them on 4/18.  Diet : N.p.o until she is more awake and alert  Disposition Plan : Remain in the ICU  Consults : PCCM  Procedures  :   3/30>> ETT 3/21>>  RUE PICC 4/10>> A line-out 4/15>>extubated 4/15>>ETT (re-intubated)  GI prophylaxis: Protonix  DVT Prophylaxis  : Eliquis  Lab Results  Component Value Date   PLT 460 (H) 09/27/2018    Inpatient Medications  Scheduled Meds:  apixaban  5 mg Per Tube BID   chlorhexidine gluconate (MEDLINE KIT)  15 mL Mouth Rinse BID   Chlorhexidine Gluconate Cloth  6 each Topical Daily   clonazepam  1 mg Oral BID   feeding supplement (PRO-STAT SUGAR FREE 64)  30 mL Per Tube TID   free water  300 mL Per Tube Q8H   furosemide  40 mg Intravenous BID   gabapentin  100 mg Per Tube QHS   insulin aspart  0-15 Units Subcutaneous Q4H   insulin aspart  4 Units Subcutaneous Q4H   insulin glargine  30 Units Subcutaneous Q24H   mouth rinse  15 mL Mouth Rinse 10 times per day   pantoprazole sodium  40 mg Per Tube Daily   polyethylene glycol  17 g Per Tube Daily   QUEtiapine  50 mg Oral BID   sodium chloride flush  10-40 mL Intracatheter Q12H   Continuous Infusions:  sodium chloride Stopped (09/26/18 2008)   sodium chloride 10 mL/hr at 09/27/18 0700   ampicillin (OMNIPEN) IV Stopped (09/27/18 0540)   cefTRIAXone (ROCEPHIN)  IV     dexmedetomidine (PRECEDEX) IV infusion 0.9 mcg/kg/hr (09/27/18 0700)   feeding supplement (OSMOLITE 1.2 CAL) Stopped (09/26/18 2330)   norepinephrine (LEVOPHED) Adult infusion 8 mcg/min (09/25/18 1905)   PRN Meds:.sodium chloride, acetaminophen (TYLENOL) oral liquid 160 mg/5 mL, albuterol, fentaNYL (SUBLIMAZE) injection, fentaNYL (SUBLIMAZE) injection, hydrALAZINE, midazolam, midazolam, sodium chloride flush  Antibiotics  :    Anti-infectives (From admission, onward)   Start     Dose/Rate Route Frequency Ordered Stop   09/27/18 1300  cefTRIAXone (ROCEPHIN) 2 g in sodium chloride 0.9 % 100 mL IVPB     2 g 200 mL/hr over 30 Minutes Intravenous Every 24 hours 09/27/18 0928     09/26/18 1400  ampicillin (OMNIPEN) 1 g in sodium chloride 0.9 % 100 mL IVPB  1 g 300 mL/hr over 20 Minutes Intravenous Every 8 hours 09/26/18 1204     09/26/18 1300  cefTRIAXone (ROCEPHIN) 1 g in sodium chloride 0.9 % 100 mL IVPB  Status:  Discontinued     1 g 200 mL/hr over 30 Minutes Intravenous Every 24 hours 09/26/18 1204 09/27/18 0928   09/11/18 2200  hydroxychloroquine (PLAQUENIL) tablet 200 mg  Status:  Discontinued     200 mg Oral 2 times daily 09/10/18 2309 09/10/18 2319   09/11/18 2200  hydroxychloroquine (PLAQUENIL) tablet 200 mg     200 mg Per Tube 2 times daily 09/10/18 2319 09/15/18 0851   09/10/18 2330  hydroxychloroquine (PLAQUENIL) tablet 400 mg     400 mg Per Tube 2 times daily 09/10/18 2319 09/11/18 0823   09/10/18 2315  hydroxychloroquine (PLAQUENIL) tablet 400 mg  Status:  Discontinued     400 mg Oral 2 times daily 09/10/18 2309 09/10/18 2319   09/10/18 1300  azithromycin (ZITHROMAX) 500 mg in sodium chloride 0.9 % 250 mL IVPB     500 mg 250 mL/hr over 60 Minutes Intravenous Every 24 hours 09/09/18 1342 09/15/18 1531   09/10/18 1200  cefTRIAXone (ROCEPHIN) 1 g in sodium chloride 0.9 % 100 mL IVPB  Status:  Discontinued     1 g 200 mL/hr over 30 Minutes Intravenous Every 24 hours 09/09/18 1342 09/14/18 1436   09/09/18 1200  cefTRIAXone (ROCEPHIN) 1 g in sodium chloride 0.9 % 100 mL IVPB     1 g 200 mL/hr over 30 Minutes Intravenous  Once 09/09/18 1148 09/09/18 1248   09/09/18 1200  azithromycin (ZITHROMAX) 500 mg in sodium chloride 0.9 % 250 mL IVPB     500 mg 250 mL/hr over 60 Minutes Intravenous  Once 09/09/18 1148 09/09/18 1351      Time spent 35 minutes   Oren Binet M.D on 09/27/2018 at 9:42 AM  To page go to [email or web address] - password [email or web address]  [facility] -  Office  [phone]  See all Orders from today for further details  Admit date - 09/09/2018    18    Objective:   Vitals:   09/27/18 0706 09/27/18 0720 09/27/18 0752 09/27/18 0925  BP: (!) 149/63 (!) 149/63  139/64  Pulse:  (!) 53  (!) 52  Resp:  14  15   Temp:    (!) 93.4 F (34.1 C)  TempSrc:      SpO2:  97% 93% 99%  Weight:      Height:        Wt Readings from Last 3 Encounters:  09/23/18 75.1 kg  06/21/17 70.3 kg  01/24/16 67.1 kg     Intake/Output Summary (Last 24 hours) at 09/27/2018 0942 Last data filed at 09/27/2018 0700 Gross per 24 hour  Intake 2487.74 ml  Output 1760 ml  Net 727.74 ml     Physical Exam Per PCCM   Data Review:    CBC Recent Labs  Lab 09/21/18 0433 09/22/18 0254  09/22/18 0615 09/23/18 0430 09/24/18 1003 09/25/18 0407 09/25/18 1624 09/26/18 0440 09/27/18 0430  WBC 13.1* 12.4*  --  18.4* 12.5* 14.5*  --   --  18.7* 18.1*  HGB 9.4* 9.2*   < > 9.7* 7.9* 7.9* 7.8* 9.2* 7.9* 7.9*  HCT 31.2* 30.4*   < > 32.7* 26.4* 25.5* 23.0* 27.0* 26.1* 25.5*  PLT 348 295  --  353 310 326  --   --  460* 460*  MCV 99.0 98.1  --  98.5 98.1 100.0  --   --  101.6* 100.0  MCH 29.8 29.7  --  29.2 29.4 31.0  --   --  30.7 31.0  MCHC 30.1 30.3  --  29.7* 29.9* 31.0  --   --  30.3 31.0  RDW 14.5 14.4  --  14.6 14.9 15.1  --   --  14.9 14.4  LYMPHSABS 1.1 1.0  --  1.1 0.9  --   --   --   --   --   MONOABS 0.9 0.8  --  1.1* 1.0  --   --   --   --   --   EOSABS 0.3 0.3  --  0.4 0.3  --   --   --   --   --   BASOSABS 0.0 0.0  --  0.1 0.0  --   --   --   --   --    < > = values in this interval not displayed.    Chemistries  Recent Labs  Lab 09/20/18 1041  09/21/18 0433  09/22/18 0615 09/23/18 0430 09/24/18 0500 09/25/18 0350 09/25/18 0407 09/25/18 1624 09/26/18 0440 09/27/18 0430  NA  --    < > 143   < > 141 145 142 143 140 142 142 143  K  --    < > 4.6   < > 4.9 4.1 4.4 4.4 4.5 4.1 4.9 3.6  CL  --    < > 99  --  98 99 99 99  --   --  98 99  CO2  --    < > 32  --  31 36* 33* 35*  --   --  34* 33*  GLUCOSE  --    < > 109*  --  187* 60* 123* 156*  --   --  279* 191*  BUN  --    < > 111*  --  100* 93* 90* 86*  --   --  93* 97*  CREATININE  --    < > 1.62*  --  1.83* 1.67* 1.63* 1.71*  --   --  1.90*  1.60*  CALCIUM  --    < > 10.4*  --  10.1 10.0 9.6 9.1  --   --  8.9 9.0  MG  --   --  2.3  --  2.2 2.1  --   --   --   --   --   --   AST 22  --   --   --   --  37  --   --   --   --   --   --   ALT 12  --   --   --   --  21  --   --   --   --   --   --   ALKPHOS 39  --   --   --   --  35*  --   --   --   --   --   --   BILITOT 0.4  --   --   --   --  0.5  --   --   --   --   --   --    < > = values in this interval not displayed.   ------------------------------------------------------------------------------------------------------------------ No results for input(s): CHOL, HDL, LDLCALC, TRIG, CHOLHDL, LDLDIRECT in the last 72 hours.  Lab Results  Component  Value Date   HGBA1C 7.3 (H) 04/19/2012   ------------------------------------------------------------------------------------------------------------------ No results for input(s): TSH, T4TOTAL, T3FREE, THYROIDAB in the last 72 hours.  Invalid input(s): FREET3 ------------------------------------------------------------------------------------------------------------------ No results for input(s): VITAMINB12, FOLATE, FERRITIN, TIBC, IRON, RETICCTPCT in the last 72 hours.  Coagulation profile Recent Labs  Lab 09/26/18 1200  INR 1.6*    No results for input(s): DDIMER in the last 72 hours.  Cardiac Enzymes Recent Labs  Lab 09/20/18 1041 09/20/18 2222 09/21/18 0433  TROPONINI 0.03* <0.03 0.04*   ------------------------------------------------------------------------------------------------------------------    Component Value Date/Time   BNP 278.8 (H) 09/25/2018 0350    Micro Results Recent Results (from the past 240 hour(s))  Culture, Urine     Status: Abnormal   Collection Time: 09/22/18  4:00 PM  Result Value Ref Range Status   Specimen Description URINE, CATHETERIZED  Final   Special Requests   Final    Normal Performed at [facility], [place] [address], [place], [place] [address]    Culture (A)   Final    >=100,000 COLONIES/mL ENTEROCOCCUS FAECALIS 10,000 COLONIES/mL CITROBACTER FREUNDII    Report Status 09/24/2018 FINAL  Final   Organism ID, Bacteria ENTEROCOCCUS FAECALIS (A)  Final   Organism ID, Bacteria CITROBACTER FREUNDII (A)  Final      Susceptibility   Citrobacter freundii - MIC*    CEFAZOLIN >=64 RESISTANT Resistant     CEFTRIAXONE <=1 SENSITIVE Sensitive     CIPROFLOXACIN <=0.25 SENSITIVE Sensitive     GENTAMICIN <=1 SENSITIVE Sensitive     IMIPENEM <=0.25 SENSITIVE Sensitive     NITROFURANTOIN <=16 SENSITIVE Sensitive     TRIMETH/SULFA <=20 SENSITIVE Sensitive     PIP/TAZO <=4 SENSITIVE Sensitive     * 10,000 COLONIES/mL CITROBACTER FREUNDII   Enterococcus faecalis - MIC*    AMPICILLIN <=2 SENSITIVE Sensitive     LEVOFLOXACIN 2 SENSITIVE Sensitive     NITROFURANTOIN <=16 SENSITIVE Sensitive     VANCOMYCIN 2 SENSITIVE Sensitive     * >=100,000 COLONIES/mL ENTEROCOCCUS FAECALIS  Culture, respiratory (non-expectorated)     Status: None   Collection Time: 09/22/18  4:00 PM  Result Value Ref Range Status   Specimen Description TRACHEAL ASPIRATE  Final   Special Requests Normal  Final   Gram Stain   Final    MODERATE WBC PRESENT,BOTH PMN AND MONONUCLEAR MODERATE GRAM POSITIVE COCCI IN PAIRS IN CLUSTERS MODERATE GRAM NEGATIVE RODS Performed at [facility], [address] [place], [place] [address]    Culture FEW KLEBSIELLA PNEUMONIAE  Final   Report Status 09/25/2018 FINAL  Final   Organism ID, Bacteria KLEBSIELLA PNEUMONIAE  Final      Susceptibility   Klebsiella pneumoniae - MIC*    AMPICILLIN RESISTANT Resistant     CEFAZOLIN <=4 SENSITIVE Sensitive     CEFEPIME <=1 SENSITIVE Sensitive     CEFTAZIDIME <=1 SENSITIVE Sensitive     CEFTRIAXONE <=1 SENSITIVE Sensitive     CIPROFLOXACIN <=0.25 SENSITIVE Sensitive     GENTAMICIN <=1 SENSITIVE Sensitive     IMIPENEM <=0.25 SENSITIVE Sensitive     TRIMETH/SULFA <=20 SENSITIVE Sensitive      AMPICILLIN/SULBACTAM 4 SENSITIVE Sensitive     PIP/TAZO <=4 SENSITIVE Sensitive     Extended ESBL NEGATIVE Sensitive     * FEW KLEBSIELLA PNEUMONIAE  Culture, blood (routine x 2)     Status: None   Collection Time: 09/22/18  4:13 PM  Result Value Ref Range Status   Specimen Description BLOOD  LEFT ANTECUBITAL  Final   Special Requests   Final    BOTTLES DRAWN AEROBIC ONLY Blood Culture adequate volume   Culture   Final    NO GROWTH 5 DAYS Performed at [facility], [address] [place], [place] [address]    Report Status 09/27/2018 FINAL  Final  Culture, blood (routine x 2)     Status: None   Collection Time: 09/22/18  6:25 PM  Result Value Ref Range Status   Specimen Description BLOOD LEFT HAND  Final   Special Requests   Final    BOTTLES DRAWN AEROBIC AND ANAEROBIC Blood Culture adequate volume   Culture   Final    NO GROWTH 5 DAYS Performed at [facility], [place] [address] [place], [place] [address]    Report Status 09/27/2018 FINAL  Final  Culture, blood (routine x 2)     Status: None (Preliminary result)   Collection Time: 09/25/18  8:05 AM  Result Value Ref Range Status   Specimen Description   Final    BLOOD LEFT ANTECUBITAL Performed at [facility] [address] [place], [place] [address]    Special Requests   Final    BOTTLES DRAWN AEROBIC AND ANAEROBIC Blood Culture adequate volume Performed at [facility] [address] [place], [place] [address]    Culture   Final    NO GROWTH 2 DAYS Performed at [facility] [address], [place], [place] [address]    Report Status PENDING  Incomplete  Culture, blood (routine x 2)     Status: None (Preliminary result)   Collection Time: 09/25/18  8:17 AM  Result Value Ref Range Status   Specimen Description   Final    BLOOD LEFT ANTECUBITAL Performed at [facility] [address], [place], [place] [address]    Special Requests   Final     BOTTLES DRAWN AEROBIC AND ANAEROBIC Blood Culture adequate volume Performed at [facility] [address], [place], [place] [address]    Culture   Final    NO GROWTH 2 DAYS Performed at [facility] [address], [place], [place] [address]    Report Status PENDING  Incomplete  Culture, Urine     Status: Abnormal   Collection Time: 09/25/18  9:03 AM  Result Value Ref Range Status   Specimen Description   Final    URINE, RANDOM Performed at [facility] [address], [place], [place] [address]    Special Requests   Final    NONE Performed at [facility], [place] [address], [place], [place] [address]    Culture (A)  Final    >=100,000 COLONIES/mL CITROBACTER FREUNDII >=100,000 COLONIES/mL ENTEROCOCCUS FAECALIS    Report Status 09/27/2018 FINAL  Final   Organism ID, Bacteria CITROBACTER FREUNDII (A)  Final   Organism ID, Bacteria ENTEROCOCCUS FAECALIS (A)  Final      Susceptibility   Citrobacter freundii - MIC*    CEFAZOLIN >=64 RESISTANT Resistant     CEFTRIAXONE <=1 SENSITIVE Sensitive     CIPROFLOXACIN <=0.25 SENSITIVE Sensitive     GENTAMICIN <=1 SENSITIVE Sensitive     IMIPENEM 0.5 SENSITIVE Sensitive     NITROFURANTOIN 32 SENSITIVE Sensitive     TRIMETH/SULFA <=20 SENSITIVE Sensitive     PIP/TAZO <=4 SENSITIVE Sensitive     * >=100,000 COLONIES/mL CITROBACTER FREUNDII   Enterococcus faecalis -  MIC*    AMPICILLIN <=2 SENSITIVE Sensitive     LEVOFLOXACIN 2 SENSITIVE Sensitive     NITROFURANTOIN <=16 SENSITIVE Sensitive     VANCOMYCIN 2 SENSITIVE Sensitive     * >=100,000 COLONIES/mL ENTEROCOCCUS FAECALIS    Radiology Reports Dg Chest Port 1 View  Result Date: 09/26/2018 CLINICAL DATA:  Shortness of breath. EXAM: PORTABLE CHEST 1 VIEW COMPARISON:  One-view chest x-ray 09/25/2018 FINDINGS: The heart size is exaggerate by low lung volumes. The endotracheal tube has been pulled back slightly, now  terminating 2.5 cm above the carina. A right-sided PICC line is stable. The NG tube courses off the inferior border of the film. Bilateral patchy interstitial and airspace disease is again noted. There is less right middle lobe consolidation. No definite effusions are present. IMPRESSION: 1. Improved aeration of the right middle lobe. 2. Persistent patchy interstitial and airspace disease bilaterally is worrisome infection or edema. 3. Repositioning of endotracheal tube, now 2.5 cm above the carina. Electronically Signed   By: San Morelle M.D.   On: 09/26/2018 07:52   Dg Chest Port 1 View  Result Date: 09/25/2018 CLINICAL DATA:  78 year old female with a history of intubation EXAM: PORTABLE CHEST 1 VIEW COMPARISON:  09/24/2018 FINDINGS: Endotracheal tube now terminates at the carina. Tube may be withdrawn 6 cm-7 cm for better positioning. Right upper extremity PICC. Gastric tube terminates out of the field of view. Low lung volumes with similar appearance of reticulonodular opacity throughout with increasing airspace opacity in the right mid lung. Persistently low lung volumes.  No pneumothorax or pleural effusion. IMPRESSION: Endotracheal tube terminates at the carina and may be withdrawn 6 cm-7 cm for better positioning. Low lung volumes persist with mixed interstitial and airspace opacities bilaterally, with increasing airspace opacity in the right mid lung. Unchanged right upper extremity PICC and gastric tube. Electronically Signed   By: Corrie Mckusick D.O.   On: 09/25/2018 19:11   Dg Chest Port 1 View  Result Date: 09/24/2018 CLINICAL DATA:  Short of breath EXAM: PORTABLE CHEST 1 VIEW COMPARISON:  09/23/2018 FINDINGS: Normal heart size. Endotracheal tube, NG tube, and right PICC are stable. Patchy airspace opacities throughout both lungs are not significantly changed. No pneumothorax. IMPRESSION: Stable patchy airspace disease throughout both lungs. Electronically Signed   By: Marybelle Killings M.D.    On: 09/24/2018 07:41   Dg Chest Port 1 View  Result Date: 09/23/2018 CLINICAL DATA:  Respiratory failure. EXAM: PORTABLE CHEST 1 VIEW COMPARISON:  One-view chest x-ray 09/21/2018 FINDINGS: The heart size is normal. Endotracheal tube is stable. NG tube courses off the inferior border the film. Right-sided PICC line is stable and in satisfactory position. Bilateral interstitial and airspace disease is similar the prior exam. There is improved aeration at the left base. Increased airspace opacities are noted in the right middle and upper lobes. IMPRESSION: 1. Bilateral interstitial and airspace disease concerning for infection. There is slight increase on the right and some improvement on the left. 2. Support apparatus is stable. Electronically Signed   By: San Morelle M.D.   On: 09/23/2018 07:41   Dg Chest Port 1 View  Result Date: 09/21/2018 CLINICAL DATA:  Respiratory failure. EXAM: PORTABLE CHEST 1 VIEW COMPARISON:  Radiograph of September 21, 2018. FINDINGS: Stable cardiomediastinal silhouette. Endotracheal and nasogastric tubes are in grossly good position. Right-sided PICC line is unchanged. No pneumothorax or significant pleural effusion is noted. Stable bilateral lung opacities are noted concerning for pneumonia or possibly edema. Bony thorax is  unremarkable. IMPRESSION: Stable support apparatus. Stable bilateral lung opacities as described above. Electronically Signed   By: Marijo Conception, M.D.   On: 09/21/2018 22:03   Dg Chest Port 1 View  Result Date: 09/21/2018 CLINICAL DATA:  Respiratory failure EXAM: PORTABLE CHEST 1 VIEW COMPARISON:  09/20/2018 FINDINGS: Endotracheal tube in good position. Right arm PICC tip in the proximal SVC unchanged. NG tube in the stomach. Mild improvement in bilateral airspace disease. Small left effusion. IMPRESSION: Endotracheal tube in good position Diffuse bilateral airspace disease with mild improvement. Electronically Signed   By: Franchot Gallo M.D.    On: 09/21/2018 08:10   Dg Chest Port 1 View  Result Date: 09/20/2018 CLINICAL DATA:  Respiratory failure.  Ventilator support. EXAM: PORTABLE CHEST 1 VIEW COMPARISON:  09/18/2018 FINDINGS: Endotracheal tube tip is 4 cm above the carina. Orogastric tube enters the abdomen. Bilateral patchy pulmonary infiltrates appear quite similar, with slightly more volume loss at the right lung base. No new finding otherwise. IMPRESSION: Persistent patchy bilateral pulmonary infiltrates. Slight worsening of volume loss at the right lung base. Electronically Signed   By: Nelson Chimes M.D.   On: 09/20/2018 06:11   Dg Chest Port 1 View  Result Date: 09/18/2018 CLINICAL DATA:  ARDS, COVID+ Pt intubated on airborne and contact PPE: Gloves, gown, N95, goggles, bouffant. EXAM: PORTABLE CHEST - 1 VIEW COMPARISON:  09/16/2018 FINDINGS: Endotracheal tube, nasogastric tube, right arm PICC stable. Slight improvement in right lower lung airspace disease with patchy hazy infiltrates in the left mid and lower lung and right upper lung as before. Heart size and mediastinal contours are within normal limits. No effusion. No pneumothorax. Fixation hardware in the proximal right humerus. IMPRESSION: 1. Patchy bilateral airspace disease, with slight improvement in right lower lung opacities since prior study. 2. Support hardware stable in position. Electronically Signed   By: Lucrezia Europe M.D.   On: 09/18/2018 08:05   Dg Chest Port 1 View  Result Date: 09/16/2018 CLINICAL DATA:  Respiratory failure EXAM: PORTABLE CHEST 1 VIEW COMPARISON:  Yesterday FINDINGS: Endotracheal tube tip between the clavicular heads and carina. The orogastric tube at least reaches the stomach. Right-sided central line with tip at the SVC. No appreciable change in patchy bilateral interstitial and airspace opacity. Stable heart size. No evidence of air leak or pleural fluid. IMPRESSION: 1. Unchanged multifocal pneumonia. 2. Stable hardware positioning. Electronically  Signed   By: Monte Fantasia M.D.   On: 09/16/2018 07:09   Dg Chest Port 1 View  Result Date: 09/15/2018 CLINICAL DATA:  Respiratory failure. Intubated patient. Follow-up exam. EXAM: PORTABLE CHEST 1 VIEW COMPARISON:  Prior studies, most recent 09/14/2018 FINDINGS: Cardiac silhouette is normal size.  No mediastinal or hilar masses. There are hazy bilateral airspace lung opacities which are without change from the previous day's exam. No new lung abnormalities. No convincing pleural effusion and no pneumothorax. Endotracheal tube, nasal/orogastric tube and right PICC are stable. IMPRESSION: 1. No change from previous day's study. 2. Persistent bilateral ground-glass lung opacities. 3. Stable well-positioned support apparatus. Electronically Signed   By: Lajean Manes M.D.   On: 09/15/2018 06:47   Dg Chest Port 1 View  Result Date: 09/14/2018 CLINICAL DATA:  Respiratory failure.  COVID-19. EXAM: PORTABLE CHEST 1 VIEW COMPARISON:  09/13/2018 and prior studies dating back to 09/09/2018. FINDINGS: Bilateral ground-glass lung opacities are without significant change from the prior exams. Atelectasis at the left lung base is stable. More confluent opacity at the right lung base is  less prominent, which appears to be due to larger lung volumes on the current exam. No new lung abnormalities.  No pleural effusion or pneumothorax. Endotracheal tube, nasal/orogastric tube and right PICC are stable in well positioned. IMPRESSION: 1. No significant change from the previous day's study. 2. Persistent bilateral ground-glass lung opacities more confluent basilar opacities. 3. Stable well-positioned support apparatus. Electronically Signed   By: Lajean Manes M.D.   On: 09/14/2018 06:24   Dg Chest Port 1 View  Result Date: 09/13/2018 CLINICAL DATA:  Respiratory failure EXAM: PORTABLE CHEST 1 VIEW COMPARISON:  Yesterday FINDINGS: Endotracheal tube tip between the clavicular heads and carina. The orogastric tube has been  advanced and now at least reaches the stomach-as confirmed on interval KUB. Right PICC with tip at the SVC. Patchy bilateral airspace disease. Borderline heart size. No effusion or pneumothorax. IMPRESSION: Stable hardware positioning and bilateral pneumonia. Electronically Signed   By: Monte Fantasia M.D.   On: 09/13/2018 06:08   Dg Chest Port 1 View  Result Date: 09/12/2018 CLINICAL DATA:  History of endotracheal tube EXAM: PORTABLE CHEST 1 VIEW COMPARISON:  Yesterday FINDINGS: Endotracheal tube tip between the clavicular heads and carina. The orogastric tube tip is in the region of the GE junction. Right PICC with tip near the SVC origin. Patchy bilateral lung opacity with both interstitial and airspace appearance. No effusion or pneumothorax. Normal heart size. These results will be called to the ordering clinician or representative by the Radiologist Assistant, and communication documented in the PACS or zVision Dashboard. IMPRESSION: 1. Shortened orogastric tube with tip now at the GE junction. 2. Other hardware positioning is stable. 3. Bilateral lung opacity with progression. Electronically Signed   By: Monte Fantasia M.D.   On: 09/12/2018 07:14   Dg Chest Port 1 View  Result Date: 09/11/2018 CLINICAL DATA:  Respiratory failure EXAM: PORTABLE CHEST 1 VIEW COMPARISON:  09/10/2018 FINDINGS: Endotracheal tube and NG tube are stable. Interval placement of right PICC line with the tip in the SVC. Patchy bilateral airspace disease similar to prior study. Heart is normal size. No visible effusions or acute bony abnormality. IMPRESSION: Right PICC line tip in the SVC. Remainder of the support devices stable. Stable patchy bilateral airspace disease. Electronically Signed   By: Rolm Baptise M.D.   On: 09/11/2018 07:36   Dg Chest Port 1 View  Result Date: 09/10/2018 CLINICAL DATA:  Respiratory failure EXAM: PORTABLE CHEST 1 VIEW COMPARISON:  09/09/2018 FINDINGS: Support devices are stable. Heart is normal  size. Patchy bilateral airspace disease again noted, not significantly changed. No visible effusions or acute bony abnormality. IMPRESSION: Patchy bilateral airspace disease, not significantly changed. Electronically Signed   By: Rolm Baptise M.D.   On: 09/10/2018 07:39   Dg Chest Port 1 View  Result Date: 09/09/2018 CLINICAL DATA:  Respiratory failure. EXAM: PORTABLE CHEST 1 VIEW COMPARISON:  Chest x-ray from same day at 1 a.m. FINDINGS: Interval placement of an endotracheal tube with the tip 3.9 cm above the carina. Enteric tube entering the stomach with the tip below the field of view. The heart size and mediastinal contours are within normal limits. Patchy asymmetric, hazy opacities in both lungs again noted, slightly more consolidative appearance in the left mid lung and right lower lobe. No pleural effusion or pneumothorax. No acute osseous abnormality. IMPRESSION: 1. Appropriately positioned endotracheal tube. 2. Patchy asymmetric airspace disease in both lungs again noted, with slightly more focal consolidative appearance in the left mid lung and right lower  lobe. Findings are nonspecific, but concerning for atypical infection, including potential viral pneumonia. Electronically Signed   By: Titus Dubin M.D.   On: 09/09/2018 17:38   Dg Chest Port 1 View  Result Date: 09/09/2018 CLINICAL DATA:  Short of breath, cough.  Diarrhea. EXAM: PORTABLE CHEST 1 VIEW COMPARISON:  Chest radiograph 05/29/2012 FINDINGS: Normal cardiac silhouette. There is bilateral fine airspace disease in the upper lobes. No pleural fluid. No pneumothorax. No acute osseous abnormality. RIGHT shoulder internal fixation. IMPRESSION: Bilateral upper lobe airspace disease representing multifocal pneumonia versus pulmonary edema. Electronically Signed   By: Suzy Bouchard M.D.   On: 09/09/2018 11:27   Dg Abd Portable 1v  Result Date: 09/25/2018 CLINICAL DATA:  78 y/o  F; intubation, NG placement. EXAM: PORTABLE ABDOMEN - 1  VIEW COMPARISON:  09/12/2018 abdomen radiographs. FINDINGS: Enteric tube tip projects over the gastric body. Normal bowel gas pattern. Ill-defined patchy opacities in lung bases. No acute osseous abnormality is evident. IMPRESSION: Enteric tube tip projects over the gastric body. Normal bowel gas pattern. Electronically Signed   By: Kristine Garbe M.D.   On: 09/25/2018 19:19   Dg Abd Portable 1v  Result Date: 09/12/2018 CLINICAL DATA:  Orogastric tube placement. EXAM: PORTABLE ABDOMEN - 1 VIEW COMPARISON:  CT abdomen and pelvis June 24, 2018 FINDINGS: Nasogastric tube tip projects in gastric antrum. No intra-abdominal mass effect or pathologic calcifications. Included bowel gas pattern is nondilated and nonobstructive. Mild vascular calcifications. Interstitial and alveolar airspace opacities included lung bases better characterized on today's dedicated radiograph. Thoracolumbar levoscoliosis. IMPRESSION: 1. Nasogastric tube tip projects in gastric antrum. Electronically Signed   By: Elon Alas M.D.   On: 09/12/2018 22:13   [place] Ekg Site Rite  Result Date: 09/10/2018 If Site Rite image not attached, placement could not be confirmed due to current cardiac rhythm.

## 2018-09-27 NOTE — Progress Notes (Signed)
Called and left message for patient's daughter, Arrie Aran, to give an update. Requested daughter to call me back when she had time.  Roselyn Reef Clinton Dragone,RN

## 2018-09-27 NOTE — TOC Progression Note (Signed)
Transition of Care [facility]) - Progression Note    Patient Details  Name: Sabrina Baker MRN: [id number] Date of Birth: 03/12/1941  [facility] CM/SW Contact  Loletha Grayer Beverely Pace, RN Phone Number: 09/27/2018, 2:55 PM  Clinical Narrative:   COVID Positive patient. Remains on vent support.          Expected Discharge Plan and Services    Case manager continuing to monitor . May God Bless her to recover.                                   Social Determinants of Health (SDOH) Interventions    Readmission Risk Interventions No flowsheet data found.

## 2018-09-27 NOTE — Progress Notes (Signed)
Patient with axillary temp of 93.4. MD notified, bear hugger warming blanket placed.  Will continue to monitor. - Soyla Dryer, RN

## 2018-09-28 DIAGNOSIS — E875 Hyperkalemia: Secondary | ICD-10-CM

## 2018-09-28 LAB — CBC
HCT: 26.9 % — ABNORMAL LOW (ref 36.0–46.0)
Hemoglobin: 8.2 g/dL — ABNORMAL LOW (ref 12.0–15.0)
MCH: 30.8 pg (ref 26.0–34.0)
MCHC: 30.5 g/dL (ref 30.0–36.0)
MCV: 101.1 fL — ABNORMAL HIGH (ref 80.0–100.0)
Platelets: 528 10*3/uL — ABNORMAL HIGH (ref 150–400)
RBC: 2.66 MIL/uL — ABNORMAL LOW (ref 3.87–5.11)
RDW: 15 % (ref 11.5–15.5)
WBC: 12.1 10*3/uL — ABNORMAL HIGH (ref 4.0–10.5)
nRBC: 0 % (ref 0.0–0.2)

## 2018-09-28 LAB — T3, FREE: T3, Free: 0.8 pg/mL — ABNORMAL LOW (ref 2.0–4.4)

## 2018-09-28 LAB — POCT I-STAT 7, (LYTES, BLD GAS, ICA,H+H)
Acid-Base Excess: 9 mmol/L — ABNORMAL HIGH (ref 0.0–2.0)
Bicarbonate: 33.9 mmol/L — ABNORMAL HIGH (ref 20.0–28.0)
Calcium, Ion: 1.19 mmol/L (ref 1.15–1.40)
HCT: 25 % — ABNORMAL LOW (ref 36.0–46.0)
Hemoglobin: 8.5 g/dL — ABNORMAL LOW (ref 12.0–15.0)
O2 Saturation: 94 %
Patient temperature: 99.3
Potassium: 5.3 mmol/L — ABNORMAL HIGH (ref 3.5–5.1)
Sodium: 140 mmol/L (ref 135–145)
TCO2: 35 mmol/L — ABNORMAL HIGH (ref 22–32)
pCO2 arterial: 47 mmHg (ref 32.0–48.0)
pH, Arterial: 7.467 — ABNORMAL HIGH (ref 7.350–7.450)
pO2, Arterial: 69 mmHg — ABNORMAL LOW (ref 83.0–108.0)

## 2018-09-28 LAB — BASIC METABOLIC PANEL
Anion gap: 10 (ref 5–15)
BUN: 112 mg/dL — ABNORMAL HIGH (ref 8–23)
CO2: 32 mmol/L (ref 22–32)
Calcium: 8.3 mg/dL — ABNORMAL LOW (ref 8.9–10.3)
Chloride: 101 mmol/L (ref 98–111)
Creatinine, Ser: 1.72 mg/dL — ABNORMAL HIGH (ref 0.44–1.00)
GFR calc Af Amer: 33 mL/min — ABNORMAL LOW (ref >60)
GFR calc non Af Amer: 28 mL/min — ABNORMAL LOW (ref >60)
Glucose, Bld: 272 mg/dL — ABNORMAL HIGH (ref 70–99)
Potassium: 5.2 mmol/L — ABNORMAL HIGH (ref 3.5–5.1)
Sodium: 143 mmol/L (ref 135–145)

## 2018-09-28 LAB — MAGNESIUM: Magnesium: 2.4 mg/dL (ref 1.7–2.4)

## 2018-09-28 LAB — GLUCOSE, CAPILLARY
Glucose-Capillary: 240 mg/dL — ABNORMAL HIGH (ref 70–99)
Glucose-Capillary: 248 mg/dL — ABNORMAL HIGH (ref 70–99)
Glucose-Capillary: 263 mg/dL — ABNORMAL HIGH (ref 70–99)
Glucose-Capillary: 273 mg/dL — ABNORMAL HIGH (ref 70–99)
Glucose-Capillary: 274 mg/dL — ABNORMAL HIGH (ref 70–99)

## 2018-09-28 LAB — SARS CORONAVIRUS 2 BY RT PCR (HOSPITAL ORDER, PERFORMED IN ~~LOC~~ HOSPITAL LAB): SARS Coronavirus 2: POSITIVE — AB

## 2018-09-28 LAB — T4: T4, Total: 4.3 ug/dL — ABNORMAL LOW (ref 4.5–12.0)

## 2018-09-28 LAB — PHOSPHORUS: Phosphorus: 5.5 mg/dL — ABNORMAL HIGH (ref 2.5–4.6)

## 2018-09-28 MED ORDER — INSULIN GLARGINE 100 UNIT/ML ~~LOC~~ SOLN
35.0000 [IU] | SUBCUTANEOUS | Status: DC
  Administered 2018-09-29 – 2018-09-30 (×2): 35 [IU] via SUBCUTANEOUS
  Filled 2018-09-28 (×3): qty 0.35

## 2018-09-28 MED ORDER — LEVOTHYROXINE SODIUM 100 MCG/5ML IV SOLN
12.5000 ug | Freq: Every day | INTRAVENOUS | Status: DC
  Administered 2018-09-29 – 2018-10-01 (×3): 12.5 ug via INTRAVENOUS
  Filled 2018-09-28 (×3): qty 5

## 2018-09-28 NOTE — Progress Notes (Signed)
**Note De-Identified  Obfuscation** Sputum collected for SARS/Coronavirus 2 and sent to lab.

## 2018-09-28 NOTE — Progress Notes (Signed)
NAME:  Sabrina Baker, MRN:  [id number], DOB:  [birth year], LOS: 5 ADMISSION DATE:  09/09/2018, CONSULTATION DATE:  09/09/18 REFERRING MD: Regenia Skeeter, CHIEF COMPLAINT:  SOB, diarrhea    Brief History   78 y/o F admitted 3/30 from home with c/o of cough, SOB, and abdominal pain x 2 weeks. Progressive hypoxia in ER now on NRB and CXR with bilateral infiltrates. Intubated.  COVID Positive.     Significant Hospital Events   3/30 Admitted, intubated 4/08 PSV 4 hours failed SBT due to agitation, CXR with improved aeration  4/09 Failed wean due to agitation 4/10 Hypotensive despite decreased sedation, levophed initiated 4/11 Weaned 4 hours 4/12 PSV 10/8 4/15 extubated, required re-intubation due to stridor and vocal cord edema  Consults:  Palliative care 4/5  Procedures:  3/30 ETT >>  RUE PICC 3/31 >>  ALine 4/10 >> out  Significant Diagnostic Tests:      Micro Data:  3/30 BC x 2 > negative 3/30 RVP > neg 3/30 COVID-19 >> positive 3/30 Trach asp >> normal flora 3/30 urine strep >> negative  3/30 urine legionella >> negative  Antimicrobials:  3/30 azithro >> completed  3/30 ceftriaxone >> completed 09/10/2018 Plaquenil >> completed  Interim history/subjective:  No acute events, fever overnight, hemodynamically stable, no cuff leak  Objective   Blood pressure 134/65, pulse 71, temperature (!) 101.7 F (38.7 C), temperature source Axillary, resp. rate 18, height 5\' 3"  (1.6 m), weight 75.1 kg, SpO2 96 %.    Vent Mode: CPAP;PSV FiO2 (%):  [40 %] 40 % Set Rate:  [14 bmp] 14 bmp Vt Set:  [420 mL] 420 mL PEEP:  [5 cmH20] 5 cmH20 Pressure Support:  [10 cmH20] 10 cmH20 Plateau Pressure:  [13 cmH20-17 cmH20] 15 cmH20   Intake/Output Summary (Last 24 hours) at 09/28/2018 0845 Last data filed at 09/28/2018 0800 Gross per 24 hour  Intake 1862.92 ml  Output 2965 ml  Net -1102.08 ml    Physical Exam  General:  In bed on vent HENT: NCAT ETT in place PULM: CTA B, vent supported  breathing CV: RRR, no mgr GI: BS+, soft, nontender MSK: normal bulk and tone Neuro: generally very weak, sleepy on vent but will wake up, follow commands    Resolved Hospital Problem list     Assessment & Plan:   Acute Hypoxemic Respiratory Failure secondary to SARS-CoV-2 ARDS : Oxygenation improved, chest x-ray about the same Stridor secondary to laryngeal edema, reintubated on 4/15 P: Continue full mechanical ventilatory support Continue Decadron to help with laryngeal edema Ventilator associated pneumonia prevention protocol Daily wake-up assessment and spontaneous breathing trial Needs tracheostomy, will discuss with family Plan to send novel coronavirus test from trach aspirate prior to tracheostomy  Agitated Delirium significant anxiety: Improved Sedation Needs on Mechanical Ventilation : Resolved P: Continue PAD protocol as ordered, minimize sedation, titrate to RASS 0 to -1   Fever, UTI P: Monitor cultures Agree with antibiotics as ordered by [facility]  Best practice:  Diet: tube feeding Pain/Anxiety/Delirium protocol (if indicated): in place  VAP protocol (if indicated): Yes DVT prophylaxis: Eliquis   GI prophylaxis: protonix  Glucose control: Lantus, SSI  Mobility: bedrest Code Status: full Family Communication: TRH  Disposition: ICU   Labs   CBC: Recent Labs  Lab 09/22/18 0254  09/22/18 0615 09/23/18 0430 09/24/18 1003  09/25/18 1624 09/26/18 0440 09/27/18 0430 09/28/18 0235 09/28/18 0435  WBC 12.4*  --  18.4* 12.5* 14.5*  --   --  18.7* 18.1*  12.1*  --   NEUTROABS 10.3*  --  15.7* 10.2*  --   --   --   --   --   --   --   HGB 9.2*   < > 9.7* 7.9* 7.9*   < > 9.2* 7.9* 7.9* 8.2* 8.5*  HCT 30.4*   < > 32.7* 26.4* 25.5*   < > 27.0* 26.1* 25.5* 26.9* 25.0*  MCV 98.1  --  98.5 98.1 100.0  --   --  101.6* 100.0 101.1*  --   PLT 295  --  353 310 326  --   --  460* 460* 528*  --    < > = values in this interval not displayed.    Basic Metabolic Panel:  Recent Labs  Lab 09/22/18 0254  09/22/18 0615 09/23/18 0430 09/24/18 0500 09/25/18 0350  09/25/18 1624 09/26/18 0440 09/27/18 0430 09/28/18 0235 09/28/18 0435  NA  --    < > 141 145 142 143   < > 142 142 143 143 140  K  --    < > 4.9 4.1 4.4 4.4   < > 4.1 4.9 3.6 5.2* 5.3*  CL  --    < > 98 99 99 99  --   --  98 99 101  --   CO2  --    < > 31 36* 33* 35*  --   --  34* 33* 32  --   GLUCOSE  --    < > 187* 60* 123* 156*  --   --  279* 191* 272*  --   BUN  --    < > 100* 93* 90* 86*  --   --  93* 97* 112*  --   CREATININE  --    < > 1.83* 1.67* 1.63* 1.71*  --   --  1.90* 1.60* 1.72*  --   CALCIUM  --    < > 10.1 10.0 9.6 9.1  --   --  8.9 9.0 8.3*  --   MG  --   --  2.2 2.1  --   --   --   --   --   --  2.4  --   PHOS 6.3*  --  5.8* 5.5*  --   --   --   --   --   --  5.5*  --    < > = values in this interval not displayed.   GFR: Estimated Creatinine Clearance: 26.6 mL/min (A) (by C-G formula based on SCr of 1.72 mg/dL (H)). Recent Labs  Lab 09/23/18 0430 09/24/18 1003 09/25/18 0340 09/26/18 0440 09/27/18 0430 09/28/18 0235  PROCALCITON 0.55  --  0.56 1.68  --   --   WBC 12.5* 14.5*  --  18.7* 18.1* 12.1*    Liver Function Tests: Recent Labs  Lab 09/23/18 0430  AST 37  ALT 21  ALKPHOS 35*  BILITOT 0.5  PROT 5.3*  ALBUMIN 1.6*   No results for input(s): LIPASE, AMYLASE in the last 168 hours. No results for input(s): AMMONIA in the last 168 hours.  ABG    Component Value Date/Time   PHART 7.467 (H) 09/28/2018 0435   PCO2ART 47.0 09/28/2018 0435   PO2ART 69.0 (L) 09/28/2018 0435   HCO3 33.9 (H) 09/28/2018 0435   TCO2 35 (H) 09/28/2018 0435   ACIDBASEDEF 2.0 09/11/2018 0401   O2SAT 94.0 09/28/2018 0435     Coagulation Profile: Recent Labs  Lab  09/26/18 1200  INR 1.6*    Cardiac Enzymes: Recent Labs  Lab 09/22/18 0254  CKTOTAL 11*    HbA1C: Hgb A1c MFr Bld  Date/Time Value Ref Range Status  04/19/2012 03:43 PM 7.3 (H) <5.7 % Final     Comment:    (NOTE)                                                                       According to the ADA Clinical Practice Recommendations for 2011, when HbA1c is used as a screening test:  >=6.5%   Diagnostic of Diabetes Mellitus           (if abnormal result is confirmed) 5.7-6.4%   Increased risk of developing Diabetes Mellitus References:Diagnosis and Classification of Diabetes Mellitus,Diabetes BHAL,9379,02(IOXBD 1):S62-S69 and Standards of Medical Care in         Diabetes - 2011,Diabetes ZHGD,9242,68 (Suppl 1):S11-S61.  04/18/2012 09:20 AM 7.3 (H) <5.7 % Final    Comment:    (NOTE)                                                                       According to the ADA Clinical Practice Recommendations for 2011, when HbA1c is used as a screening test:  >=6.5%   Diagnostic of Diabetes Mellitus           (if abnormal result is confirmed) 5.7-6.4%   Increased risk of developing Diabetes Mellitus References:Diagnosis and Classification of Diabetes Mellitus,Diabetes TMHD,6222,97(LGXQJ 1):S62-S69 and Standards of Medical Care in         Diabetes - 2011,Diabetes Care,2011,34 (Suppl 1):S11-S61.    CBG: Recent Labs  Lab 09/27/18 1549 09/27/18 2010 09/28/18 0000 09/28/18 0342 09/28/18 0750  GLUCAP 211* 220* 240* 248* 274*   My cc time 35 minutes   Roselie Awkward, MD [facility] Pager: [phone] Cell: [phone] If no response, call [phone]

## 2018-09-28 NOTE — Progress Notes (Signed)
PROGRESS NOTE                                                                                                                                                                                                             Patient Demographics:    Sabrina Baker, is a 78 y.o. female, DOB - 1941-05-07, UOR:[id number]  Outpatient Primary MD for the patient is [name], MD    LOS - 19  Brief Narrative: Patient is a 78 y.o. female with PMHx of HTN, DM, CKD stage III, presented with shortness of breath and cough, found to have acute hypoxic respiratory failure due to ARDS in the setting of COVID-19 infection.  Significant hospital events: 3/30 Admitted, intubated 3/31>>4/5 Plaquenil 4/13 transfer to [facility] 4/15 Extubated 4/15 Stridor-cord edema-reintubated   Subjective:  Patient apparently has been failing spontaneous breathing trials.  She already has been reintubated once.  Patient noted to open her eyes on voice command.  Does follow some commands.  Squeezes my fingers.  Moving her toes.    Assessment  & Plan :   Acute Hypoxic Resp Failure due to Acute Covid 19 Viral Illness during the ongoing 2020 Covid 19 Pandemic: She was extubated on 4/15-but then developed stridor 2/2 cord edema and was reintubated the same day. Unable to tolerate SBT due to agitation when off sedation.  Pulmonology is following and managing.  Plan is for tracheostomy in the near future.  Remains on mechanical ventilation.  Fever/UTI: Patient developed a fever of 101.7 F today.  She had a fever on 4/15 as well.  Her urine culture was positive for enterococcus and Citrobacter.  She is on ampicillin and ceftriaxone currently.  Continue to monitor fever trends.  This notes it appears that patient was also briefly hypothermic.  She was placed on dexamethasone.    Abnormal thyroid function tests TSH is 0.551 with free T4 of 4.3 and free T3 of 0.8.  Patient  without any known history of thyroid disease.  Free T4 and free T3 are below the normal range.  Case could be made for perhaps starting her on low-dose levothyroxine.  Agitation/delirium: unable to tolerate SBT due to agitation/anxiety-starting Klonopin/Seroquel  AKI on CKD stage III: AKI likely hemodynamically mediated-creatinine has improved-and now close to usual baseline.  Patient's potassium noted to be 5.3 this morning.  It appears  the patient was given 2 doses of potassium yesterday.  So this could be therapeutic.  We will just recheck it tomorrow.  Hypotension: Had episodes of hypotension previously but now noted to be hypertensive.  Continue to monitor.  She was on Levophed previously but has been titrated off.    Paroxysmal atrial fibrillation: Maintaining sinus rhythm-continue Eliquis.  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  Noted to be on Lantus.  CBGs noted to be elevated at greater than 250.  Will increase the dose of Lantus.  Continue SSI.    Depression: Resume Cymbalta when able.  Vent Settings: Vent Mode: PRVC FiO2 (%):  [40 %] 40 % Set Rate:  [14 bmp] 14 bmp Vt Set:  [420 mL] 420 mL PEEP:  [5 cmH20] 5 cmH20 Pressure Support:  [10 cmH20] 10 cmH20 Plateau Pressure:  [13 cmH20-15 cmH20] 15 cmH20  ABG:    Component Value Date/Time   PHART 7.467 (H) 09/28/2018 0435   PCO2ART 47.0 09/28/2018 0435   PO2ART 69.0 (L) 09/28/2018 0435   HCO3 33.9 (H) 09/28/2018 0435   TCO2 35 (H) 09/28/2018 0435   ACIDBASEDEF 2.0 09/11/2018 0401   O2SAT 94.0 09/28/2018 0435    Condition - Extremely Guarded  Family Communication: Pulmonology has discussed with family.  Code Status : Full code  Diet : Tube feedings  Disposition Plan : Remain in the ICU  Consults : PCCM  Procedures  :   3/30>>  ETT 3/21>> RUE PICC 4/10>> A line-out 4/15>>extubated 4/15>>ETT (re-intubated)  GI prophylaxis: Protonix  DVT Prophylaxis  : Eliquis  Lab Results  Component Value Date   PLT 528 (H) 09/28/2018    Inpatient Medications  Scheduled Meds:  apixaban  5 mg Per Tube BID   chlorhexidine gluconate (MEDLINE KIT)  15 mL Mouth Rinse BID   Chlorhexidine Gluconate Cloth  6 each Topical Daily   clonazepam  1 mg Oral BID   dexamethasone  4 mg Intravenous Q8H   feeding supplement (PRO-STAT SUGAR FREE 64)  30 mL Per Tube TID   free water  300 mL Per Tube Q8H   gabapentin  100 mg Per Tube QHS   insulin aspart  0-15 Units Subcutaneous Q4H   insulin aspart  4 Units Subcutaneous Q4H   insulin glargine  30 Units Subcutaneous Q24H   mouth rinse  15 mL Mouth Rinse 10 times per day   pantoprazole sodium  40 mg Per Tube Daily   polyethylene glycol  17 g Per Tube Daily   QUEtiapine  50 mg Oral BID   sodium chloride flush  10-40 mL Intracatheter Q12H   Continuous Infusions:  sodium chloride Stopped (09/26/18 2008)   sodium chloride 10 mL/hr at 09/28/18 1000   ampicillin (OMNIPEN) IV Stopped (09/28/18 0528)   cefTRIAXone (ROCEPHIN)  IV 2 g (09/28/18 1208)   dexmedetomidine (PRECEDEX) IV infusion 1 mcg/kg/hr (09/28/18 1054)   feeding supplement (OSMOLITE 1.2 CAL) 1,000 mL (09/28/18 1040)   norepinephrine (LEVOPHED) Adult infusion 8 mcg/min (09/25/18 1905)   PRN Meds:.sodium chloride, acetaminophen (TYLENOL) oral liquid 160 mg/5 mL, albuterol, fentaNYL (SUBLIMAZE) injection, fentaNYL (SUBLIMAZE) injection, hydrALAZINE, sodium chloride flush  Antibiotics  :    Anti-infectives (From admission, onward)   Start     Dose/Rate Route Frequency Ordered Stop   09/27/18 1300  cefTRIAXone (ROCEPHIN)  2 g in sodium chloride 0.9 % 100 mL IVPB     2 g 200 mL/hr over 30 Minutes Intravenous Every 24 hours 09/27/18 0928     09/26/18 1400  ampicillin (OMNIPEN) 1 g in sodium chloride 0.9 %  100 mL IVPB     1 g 300 mL/hr over 20 Minutes Intravenous Every 8 hours 09/26/18 1204     09/26/18 1300  cefTRIAXone (ROCEPHIN) 1 g in sodium chloride 0.9 % 100 mL IVPB  Status:  Discontinued     1 g 200 mL/hr over 30 Minutes Intravenous Every 24 hours 09/26/18 1204 09/27/18 0928   09/11/18 2200  hydroxychloroquine (PLAQUENIL) tablet 200 mg  Status:  Discontinued     200 mg Oral 2 times daily 09/10/18 2309 09/10/18 2319   09/11/18 2200  hydroxychloroquine (PLAQUENIL) tablet 200 mg     200 mg Per Tube 2 times daily 09/10/18 2319 09/15/18 0851   09/10/18 2330  hydroxychloroquine (PLAQUENIL) tablet 400 mg     400 mg Per Tube 2 times daily 09/10/18 2319 09/11/18 0823   09/10/18 2315  hydroxychloroquine (PLAQUENIL) tablet 400 mg  Status:  Discontinued     400 mg Oral 2 times daily 09/10/18 2309 09/10/18 2319   09/10/18 1300  azithromycin (ZITHROMAX) 500 mg in sodium chloride 0.9 % 250 mL IVPB     500 mg 250 mL/hr over 60 Minutes Intravenous Every 24 hours 09/09/18 1342 09/15/18 1531   09/10/18 1200  cefTRIAXone (ROCEPHIN) 1 g in sodium chloride 0.9 % 100 mL IVPB  Status:  Discontinued     1 g 200 mL/hr over 30 Minutes Intravenous Every 24 hours 09/09/18 1342 09/14/18 1436   09/09/18 1200  cefTRIAXone (ROCEPHIN) 1 g in sodium chloride 0.9 % 100 mL IVPB     1 g 200 mL/hr over 30 Minutes Intravenous  Once 09/09/18 1148 09/09/18 1248   09/09/18 1200  azithromycin (ZITHROMAX) 500 mg in sodium chloride 0.9 % 250 mL IVPB     500 mg 250 mL/hr over 60 Minutes Intravenous  Once 09/09/18 1148 09/09/18 1351        Objective:   Vitals:   09/28/18 0800 09/28/18 0900 09/28/18 0922 09/28/18 1000  BP: 134/65 (!) 161/83  (!) 153/74  Pulse: 71 70  69  Resp: _0 Temp: (!) 101.7 F (38.7 C)     TempSrc: Axillary     SpO2: 96% 96% 97% 98%  Weight:      Height:        Wt Readings from Last 3 Encounters:  09/23/18 75.1 kg  06/21/17 70.3 kg  01/24/16 67.1 kg     Intake/Output Summary  (Last 24 hours) at 09/28/2018 1213 Last data filed at 09/28/2018 1000 Gross per 24 hour  Intake 1992.95 ml  Output 2450 ml  Net -457.05 ml     Physical Exam General appearance: Patient on mechanical ventilation.  Intubated.  Partially sedated.  Follow certain commands. Resp: Coarse breath sounds bilaterally.  Crackles bilaterally.  No wheezing or rhonchi. Cardio: S1-S2 is normal regular.  No S3-S4.  No rubs murmurs or bruit GI: Abdomen is soft.  Nontender nondistended.  Bowel sounds are present normal.  No masses organomegaly Extremities: No edema.  Full range of motion of lower extremities. Neurologic: Does open her eyes to voice command.  Follow certain commands.    Data Review:    CBC Recent Labs  Lab 09/22/18 0254  09/22/18 0615 09/23/18 0430 09/24/18 1003  09/25/18 1624 09/26/18 0440 09/27/18 0430 09/28/18 0235 09/28/18 0435  WBC 12.4*  --  18.4* 12.5* 14.5*  --   --  18.7* 18.1* 12.1*  --   HGB 9.2*   < > 9.7* 7.9* 7.9*   < > 9.2* 7.9* 7.9* 8.2* 8.5*  HCT 30.4*   < > 32.7* 26.4* 25.5*   < > 27.0* 26.1* 25.5* 26.9* 25.0*  PLT 295  --  353 310 326  --   --  460* 460* 528*  --   MCV 98.1  --  98.5 98.1 100.0  --   --  101.6* 100.0 101.1*  --   MCH 29.7  --  29.2 29.4 31.0  --   --  30.7 31.0 30.8  --   MCHC 30.3  --  29.7* 29.9* 31.0  --   --  30.3 31.0 30.5  --   RDW 14.4  --  14.6 14.9 15.1  --   --  14.9 14.4 15.0  --   LYMPHSABS 1.0  --  1.1 0.9  --   --   --   --   --   --   --   MONOABS 0.8  --  1.1* 1.0  --   --   --   --   --   --   --   EOSABS 0.3  --  0.4 0.3  --   --   --   --   --   --   --   BASOSABS 0.0  --  0.1 0.0  --   --   --   --   --   --   --    < > = values in this interval not displayed.    Chemistries  Recent Labs  Lab 09/22/18 0615 09/23/18 0430 09/24/18 0500 09/25/18 0350  09/25/18 1624 09/26/18 0440 09/27/18 0430 09/28/18 0235 09/28/18 0435  NA 141 145 142 143   < > 142 142 143 143 140  K 4.9 4.1 4.4 4.4   < > 4.1 4.9 3.6 5.2*  5.3*  CL 98 99 99 99  --   --  98 99 101  --   CO2 31 36* 33* 35*  --   --  34* 33* 32  --   GLUCOSE 187* 60* 123* 156*  --   --  279* 191* 272*  --   BUN 100* 93* 90* 86*  --   --  93* 97* 112*  --   CREATININE 1.83* 1.67* 1.63* 1.71*  --   --  1.90* 1.60* 1.72*  --   CALCIUM 10.1 10.0 9.6 9.1  --   --  8.9 9.0 8.3*  --   MG 2.2 2.1  --   --   --   --   --   --  2.4  --   AST  --  37  --   --   --   --   --   --   --   --   ALT  --  21  --   --   --   --   --   --   --   --   ALKPHOS  --  35*  --   --   --   --   --   --   --   --   BILITOT  --  0.5  --   --   --   --   --   --   --   --    < > =  values in this interval not displayed.   ------------------------------------------------------------------------------------------------------------------ No results for input(s): CHOL, HDL, LDLCALC, TRIG, CHOLHDL, LDLDIRECT in the last 72 hours.  Lab Results  Component Value Date   HGBA1C 7.3 (H) 04/19/2012   ------------------------------------------------------------------------------------------------------------------ Recent Labs    09/27/18 1150  TSH 0.551  T4TOTAL 4.3*  T3FREE 0.8*    Coagulation profile Recent Labs  Lab 09/26/18 1200  INR 1.6*        Component Value Date/Time   BNP 278.8 (H) 09/25/2018 0350    Micro Results Recent Results (from the past 240 hour(s))  Culture, Urine     Status: Abnormal   Collection Time: 09/22/18  4:00 PM  Result Value Ref Range Status   Specimen Description URINE, CATHETERIZED  Final   Special Requests   Final    Normal Performed at [facility], [place] [address] [place], [place] [address]    Culture (A)  Final    >=100,000 COLONIES/mL ENTEROCOCCUS FAECALIS 10,000 COLONIES/mL CITROBACTER FREUNDII    Report Status 09/24/2018 FINAL  Final   Organism ID, Bacteria ENTEROCOCCUS FAECALIS (A)  Final   Organism ID, Bacteria CITROBACTER FREUNDII (A)  Final      Susceptibility   Citrobacter freundii - MIC*    CEFAZOLIN >=64  RESISTANT Resistant     CEFTRIAXONE <=1 SENSITIVE Sensitive     CIPROFLOXACIN <=0.25 SENSITIVE Sensitive     GENTAMICIN <=1 SENSITIVE Sensitive     IMIPENEM <=0.25 SENSITIVE Sensitive     NITROFURANTOIN <=16 SENSITIVE Sensitive     TRIMETH/SULFA <=20 SENSITIVE Sensitive     PIP/TAZO <=4 SENSITIVE Sensitive     * 10,000 COLONIES/mL CITROBACTER FREUNDII   Enterococcus faecalis - MIC*    AMPICILLIN <=2 SENSITIVE Sensitive     LEVOFLOXACIN 2 SENSITIVE Sensitive     NITROFURANTOIN <=16 SENSITIVE Sensitive     VANCOMYCIN 2 SENSITIVE Sensitive     * >=100,000 COLONIES/mL ENTEROCOCCUS FAECALIS  Culture, respiratory (non-expectorated)     Status: None   Collection Time: 09/22/18  4:00 PM  Result Value Ref Range Status   Specimen Description TRACHEAL ASPIRATE  Final   Special Requests Normal  Final   Gram Stain   Final    MODERATE WBC PRESENT,BOTH PMN AND MONONUCLEAR MODERATE GRAM POSITIVE COCCI IN PAIRS IN CLUSTERS MODERATE GRAM NEGATIVE RODS Performed at [facility], [address], [place], [place] [address]    Culture FEW KLEBSIELLA PNEUMONIAE  Final   Report Status 09/25/2018 FINAL  Final   Organism ID, Bacteria KLEBSIELLA PNEUMONIAE  Final      Susceptibility   Klebsiella pneumoniae - MIC*    AMPICILLIN RESISTANT Resistant     CEFAZOLIN <=4 SENSITIVE Sensitive     CEFEPIME <=1 SENSITIVE Sensitive     CEFTAZIDIME <=1 SENSITIVE Sensitive     CEFTRIAXONE <=1 SENSITIVE Sensitive     CIPROFLOXACIN <=0.25 SENSITIVE Sensitive     GENTAMICIN <=1 SENSITIVE Sensitive     IMIPENEM <=0.25 SENSITIVE Sensitive     TRIMETH/SULFA <=20 SENSITIVE Sensitive     AMPICILLIN/SULBACTAM 4 SENSITIVE Sensitive     PIP/TAZO <=4 SENSITIVE Sensitive     Extended ESBL NEGATIVE Sensitive     * FEW KLEBSIELLA PNEUMONIAE  Culture, blood (routine x 2)     Status: None   Collection Time: 09/22/18  4:13 PM  Result Value Ref Range Status   Specimen Description BLOOD LEFT ANTECUBITAL  Final   Special  Requests   Final    BOTTLES DRAWN  AEROBIC ONLY Blood Culture adequate volume   Culture   Final    NO GROWTH 5 DAYS Performed at [facility], [place] [address] [place], [place] [address]    Report Status 09/27/2018 FINAL  Final  Culture, blood (routine x 2)     Status: None   Collection Time: 09/22/18  6:25 PM  Result Value Ref Range Status   Specimen Description BLOOD LEFT HAND  Final   Special Requests   Final    BOTTLES DRAWN AEROBIC AND ANAEROBIC Blood Culture adequate volume   Culture   Final    NO GROWTH 5 DAYS Performed at [facility], [place] [address] [place], [place] [address]    Report Status 09/27/2018 FINAL  Final  Culture, blood (routine x 2)     Status: None (Preliminary result)   Collection Time: 09/25/18  8:05 AM  Result Value Ref Range Status   Specimen Description   Final    BLOOD LEFT ANTECUBITAL Performed at [place] [address], [place], [place] [address]    Special Requests   Final    BOTTLES DRAWN AEROBIC AND ANAEROBIC Blood Culture adequate volume Performed at [place] [address], [place], [place] [address]    Culture   Final    NO GROWTH 3 DAYS Performed at [facility], [place] [address], [place], [place] [address]    Report Status PENDING  Incomplete  Culture, blood (routine x 2)     Status: None (Preliminary result)   Collection Time: 09/25/18  8:17 AM  Result Value Ref Range Status   Specimen Description   Final    BLOOD LEFT ANTECUBITAL Performed at [place] [address] [place], [place] [address]    Special Requests   Final    BOTTLES DRAWN AEROBIC AND ANAEROBIC Blood Culture adequate volume Performed at [place] [address], [place], [place] [address]    Culture   Final    NO GROWTH 3 DAYS Performed at [facility], [place] [address] [place], [place] [address]    Report Status PENDING  Incomplete  Culture, Urine      Status: Abnormal   Collection Time: 09/25/18  9:03 AM  Result Value Ref Range Status   Specimen Description   Final    URINE, RANDOM Performed at [place] [address], [place], [place] [address]    Special Requests   Final    NONE Performed at [facility], [place] [address], [place],  [address]    Culture (A)  Final    >=100,000 COLONIES/mL CITROBACTER FREUNDII >=100,000 COLONIES/mL ENTEROCOCCUS FAECALIS    Report Status 09/27/2018 FINAL  Final   Organism ID, Bacteria CITROBACTER FREUNDII (A)  Final   Organism ID, Bacteria ENTEROCOCCUS FAECALIS (A)  Final      Susceptibility   Citrobacter freundii - MIC*    CEFAZOLIN >=64 RESISTANT Resistant     CEFTRIAXONE <=1 SENSITIVE Sensitive     CIPROFLOXACIN <=0.25 SENSITIVE Sensitive     GENTAMICIN <=1 SENSITIVE Sensitive     IMIPENEM 0.5 SENSITIVE Sensitive     NITROFURANTOIN 32 SENSITIVE Sensitive     TRIMETH/SULFA <=20 SENSITIVE Sensitive     PIP/TAZO <=4 SENSITIVE Sensitive     * >=100,000 COLONIES/mL CITROBACTER FREUNDII   Enterococcus faecalis - MIC*    AMPICILLIN <=2 SENSITIVE Sensitive     LEVOFLOXACIN 2 SENSITIVE Sensitive  NITROFURANTOIN <=16 SENSITIVE Sensitive     VANCOMYCIN 2 SENSITIVE Sensitive     * >=100,000 COLONIES/mL ENTEROCOCCUS FAECALIS    Radiology Reports Dg Chest Port 1 View  Result Date: 09/26/2018 CLINICAL DATA:  Shortness of breath. EXAM: PORTABLE CHEST 1 VIEW COMPARISON:  One-view chest x-ray 09/25/2018 FINDINGS: The heart size is exaggerate by low lung volumes. The endotracheal tube has been pulled back slightly, now terminating 2.5 cm above the carina. A right-sided PICC line is stable. The NG tube courses off the inferior border of the film. Bilateral patchy interstitial and airspace disease is again noted. There is less right middle lobe consolidation. No definite effusions are present. IMPRESSION: 1. Improved aeration of the right middle lobe. 2.  Persistent patchy interstitial and airspace disease bilaterally is worrisome infection or edema. 3. Repositioning of endotracheal tube, now 2.5 cm above the carina. Electronically Signed   By: San Morelle M.D.   On: 09/26/2018 07:52   Dg Chest Port 1 View  Result Date: 09/25/2018 CLINICAL DATA:  78 year old female with a history of intubation EXAM: PORTABLE CHEST 1 VIEW COMPARISON:  09/24/2018 FINDINGS: Endotracheal tube now terminates at the carina. Tube may be withdrawn 6 cm-7 cm for better positioning. Right upper extremity PICC. Gastric tube terminates out of the field of view. Low lung volumes with similar appearance of reticulonodular opacity throughout with increasing airspace opacity in the right mid lung. Persistently low lung volumes.  No pneumothorax or pleural effusion. IMPRESSION: Endotracheal tube terminates at the carina and may be withdrawn 6 cm-7 cm for better positioning. Low lung volumes persist with mixed interstitial and airspace opacities bilaterally, with increasing airspace opacity in the right mid lung. Unchanged right upper extremity PICC and gastric tube. Electronically Signed   By: Corrie Mckusick D.O.   On: 09/25/2018 19:11   Dg Chest Port 1 View  Result Date: 09/24/2018 CLINICAL DATA:  Short of breath EXAM: PORTABLE CHEST 1 VIEW COMPARISON:  09/23/2018 FINDINGS: Normal heart size. Endotracheal tube, NG tube, and right PICC are stable. Patchy airspace opacities throughout both lungs are not significantly changed. No pneumothorax. IMPRESSION: Stable patchy airspace disease throughout both lungs. Electronically Signed   By: Marybelle Killings M.D.   On: 09/24/2018 07:41   Dg Chest Port 1 View  Result Date: 09/23/2018 CLINICAL DATA:  Respiratory failure. EXAM: PORTABLE CHEST 1 VIEW COMPARISON:  One-view chest x-ray 09/21/2018 FINDINGS: The heart size is normal. Endotracheal tube is stable. NG tube courses off the inferior border the film. Right-sided PICC line is stable and in  satisfactory position. Bilateral interstitial and airspace disease is similar the prior exam. There is improved aeration at the left base. Increased airspace opacities are noted in the right middle and upper lobes. IMPRESSION: 1. Bilateral interstitial and airspace disease concerning for infection. There is slight increase on the right and some improvement on the left. 2. Support apparatus is stable. Electronically Signed   By: San Morelle M.D.   On: 09/23/2018 07:41   Dg Chest Port 1 View  Result Date: 09/21/2018 CLINICAL DATA:  Respiratory failure. EXAM: PORTABLE CHEST 1 VIEW COMPARISON:  Radiograph of September 21, 2018. FINDINGS: Stable cardiomediastinal silhouette. Endotracheal and nasogastric tubes are in grossly good position. Right-sided PICC line is unchanged. No pneumothorax or significant pleural effusion is noted. Stable bilateral lung opacities are noted concerning for pneumonia or possibly edema. Bony thorax is unremarkable. IMPRESSION: Stable support apparatus. Stable bilateral lung opacities as described above. Electronically Signed   By: Sabino Dick  Brooke Bonito, M.D.   On: 09/21/2018 22:03   Dg Chest Port 1 View  Result Date: 09/21/2018 CLINICAL DATA:  Respiratory failure EXAM: PORTABLE CHEST 1 VIEW COMPARISON:  09/20/2018 FINDINGS: Endotracheal tube in good position. Right arm PICC tip in the proximal SVC unchanged. NG tube in the stomach. Mild improvement in bilateral airspace disease. Small left effusion. IMPRESSION: Endotracheal tube in good position Diffuse bilateral airspace disease with mild improvement. Electronically Signed   By: Franchot Gallo M.D.   On: 09/21/2018 08:10   Dg Chest Port 1 View  Result Date: 09/20/2018 CLINICAL DATA:  Respiratory failure.  Ventilator support. EXAM: PORTABLE CHEST 1 VIEW COMPARISON:  09/18/2018 FINDINGS: Endotracheal tube tip is 4 cm above the carina. Orogastric tube enters the abdomen. Bilateral patchy pulmonary infiltrates appear quite similar,  with slightly more volume loss at the right lung base. No new finding otherwise. IMPRESSION: Persistent patchy bilateral pulmonary infiltrates. Slight worsening of volume loss at the right lung base. Electronically Signed   By: Nelson Chimes M.D.   On: 09/20/2018 06:11   Dg Chest Port 1 View  Result Date: 09/18/2018 CLINICAL DATA:  ARDS, COVID+ Pt intubated on airborne and contact PPE: Gloves, gown, N95, goggles, bouffant. EXAM: PORTABLE CHEST - 1 VIEW COMPARISON:  09/16/2018 FINDINGS: Endotracheal tube, nasogastric tube, right arm PICC stable. Slight improvement in right lower lung airspace disease with patchy hazy infiltrates in the left mid and lower lung and right upper lung as before. Heart size and mediastinal contours are within normal limits. No effusion. No pneumothorax. Fixation hardware in the proximal right humerus. IMPRESSION: 1. Patchy bilateral airspace disease, with slight improvement in right lower lung opacities since prior study. 2. Support hardware stable in position. Electronically Signed   By: Lucrezia Europe M.D.   On: 09/18/2018 08:05   Dg Chest Port 1 View  Result Date: 09/16/2018 CLINICAL DATA:  Respiratory failure EXAM: PORTABLE CHEST 1 VIEW COMPARISON:  Yesterday FINDINGS: Endotracheal tube tip between the clavicular heads and carina. The orogastric tube at least reaches the stomach. Right-sided central line with tip at the SVC. No appreciable change in patchy bilateral interstitial and airspace opacity. Stable heart size. No evidence of air leak or pleural fluid. IMPRESSION: 1. Unchanged multifocal pneumonia. 2. Stable hardware positioning. Electronically Signed   By: Monte Fantasia M.D.   On: 09/16/2018 07:09   Dg Chest Port 1 View  Result Date: 09/15/2018 CLINICAL DATA:  Respiratory failure. Intubated patient. Follow-up exam. EXAM: PORTABLE CHEST 1 VIEW COMPARISON:  Prior studies, most recent 09/14/2018 FINDINGS: Cardiac silhouette is normal size.  No mediastinal or hilar masses.  There are hazy bilateral airspace lung opacities which are without change from the previous day's exam. No new lung abnormalities. No convincing pleural effusion and no pneumothorax. Endotracheal tube, nasal/orogastric tube and right PICC are stable. IMPRESSION: 1. No change from previous day's study. 2. Persistent bilateral ground-glass lung opacities. 3. Stable well-positioned support apparatus. Electronically Signed   By: Lajean Manes M.D.   On: 09/15/2018 06:47   Dg Chest Port 1 View  Result Date: 09/14/2018 CLINICAL DATA:  Respiratory failure.  COVID-19. EXAM: PORTABLE CHEST 1 VIEW COMPARISON:  09/13/2018 and prior studies dating back to 09/09/2018. FINDINGS: Bilateral ground-glass lung opacities are without significant change from the prior exams. Atelectasis at the left lung base is stable. More confluent opacity at the right lung base is less prominent, which appears to be due to larger lung volumes on the current exam. No new lung abnormalities.  No pleural effusion or pneumothorax. Endotracheal tube, nasal/orogastric tube and right PICC are stable in well positioned. IMPRESSION: 1. No significant change from the previous day's study. 2. Persistent bilateral ground-glass lung opacities more confluent basilar opacities. 3. Stable well-positioned support apparatus. Electronically Signed   By: Lajean Manes M.D.   On: 09/14/2018 06:24   Dg Chest Port 1 View  Result Date: 09/13/2018 CLINICAL DATA:  Respiratory failure EXAM: PORTABLE CHEST 1 VIEW COMPARISON:  Yesterday FINDINGS: Endotracheal tube tip between the clavicular heads and carina. The orogastric tube has been advanced and now at least reaches the stomach-as confirmed on interval KUB. Right PICC with tip at the SVC. Patchy bilateral airspace disease. Borderline heart size. No effusion or pneumothorax. IMPRESSION: Stable hardware positioning and bilateral pneumonia. Electronically Signed   By: Monte Fantasia M.D.   On: 09/13/2018 06:08   Dg  Chest Port 1 View  Result Date: 09/12/2018 CLINICAL DATA:  History of endotracheal tube EXAM: PORTABLE CHEST 1 VIEW COMPARISON:  Yesterday FINDINGS: Endotracheal tube tip between the clavicular heads and carina. The orogastric tube tip is in the region of the GE junction. Right PICC with tip near the SVC origin. Patchy bilateral lung opacity with both interstitial and airspace appearance. No effusion or pneumothorax. Normal heart size. These results will be called to the ordering clinician or representative by the Radiologist Assistant, and communication documented in the PACS or zVision Dashboard. IMPRESSION: 1. Shortened orogastric tube with tip now at the GE junction. 2. Other hardware positioning is stable. 3. Bilateral lung opacity with progression. Electronically Signed   By: Monte Fantasia M.D.   On: 09/12/2018 07:14   Dg Chest Port 1 View  Result Date: 09/11/2018 CLINICAL DATA:  Respiratory failure EXAM: PORTABLE CHEST 1 VIEW COMPARISON:  09/10/2018 FINDINGS: Endotracheal tube and NG tube are stable. Interval placement of right PICC line with the tip in the SVC. Patchy bilateral airspace disease similar to prior study. Heart is normal size. No visible effusions or acute bony abnormality. IMPRESSION: Right PICC line tip in the SVC. Remainder of the support devices stable. Stable patchy bilateral airspace disease. Electronically Signed   By: Rolm Baptise M.D.   On: 09/11/2018 07:36   Dg Chest Port 1 View  Result Date: 09/10/2018 CLINICAL DATA:  Respiratory failure EXAM: PORTABLE CHEST 1 VIEW COMPARISON:  09/09/2018 FINDINGS: Support devices are stable. Heart is normal size. Patchy bilateral airspace disease again noted, not significantly changed. No visible effusions or acute bony abnormality. IMPRESSION: Patchy bilateral airspace disease, not significantly changed. Electronically Signed   By: Rolm Baptise M.D.   On: 09/10/2018 07:39   Dg Chest Port 1 View  Result Date: 09/09/2018 CLINICAL DATA:   Respiratory failure. EXAM: PORTABLE CHEST 1 VIEW COMPARISON:  Chest x-ray from same day at 1 a.m. FINDINGS: Interval placement of an endotracheal tube with the tip 3.9 cm above the carina. Enteric tube entering the stomach with the tip below the field of view. The heart size and mediastinal contours are within normal limits. Patchy asymmetric, hazy opacities in both lungs again noted, slightly more consolidative appearance in the left mid lung and right lower lobe. No pleural effusion or pneumothorax. No acute osseous abnormality. IMPRESSION: 1. Appropriately positioned endotracheal tube. 2. Patchy asymmetric airspace disease in both lungs again noted, with slightly more focal consolidative appearance in the left mid lung and right lower lobe. Findings are nonspecific, but concerning for atypical infection, including potential viral pneumonia. Electronically Signed   By: Huntley Dec  Derry M.D.   On: 09/09/2018 17:38   Dg Chest Port 1 View  Result Date: 09/09/2018 CLINICAL DATA:  Short of breath, cough.  Diarrhea. EXAM: PORTABLE CHEST 1 VIEW COMPARISON:  Chest radiograph 05/29/2012 FINDINGS: Normal cardiac silhouette. There is bilateral fine airspace disease in the upper lobes. No pleural fluid. No pneumothorax. No acute osseous abnormality. RIGHT shoulder internal fixation. IMPRESSION: Bilateral upper lobe airspace disease representing multifocal pneumonia versus pulmonary edema. Electronically Signed   By: Suzy Bouchard M.D.   On: 09/09/2018 11:27   Dg Abd Portable 1v  Result Date: 09/25/2018 CLINICAL DATA:  78 y/o  F; intubation, NG placement. EXAM: PORTABLE ABDOMEN - 1 VIEW COMPARISON:  09/12/2018 abdomen radiographs. FINDINGS: Enteric tube tip projects over the gastric body. Normal bowel gas pattern. Ill-defined patchy opacities in lung bases. No acute osseous abnormality is evident. IMPRESSION: Enteric tube tip projects over the gastric body. Normal bowel gas pattern. Electronically Signed   By: Kristine Garbe M.D.   On: 09/25/2018 19:19   Dg Abd Portable 1v  Result Date: 09/12/2018 CLINICAL DATA:  Orogastric tube placement. EXAM: PORTABLE ABDOMEN - 1 VIEW COMPARISON:  CT abdomen and pelvis June 24, 2018 FINDINGS: Nasogastric tube tip projects in gastric antrum. No intra-abdominal mass effect or pathologic calcifications. Included bowel gas pattern is nondilated and nonobstructive. Mild vascular calcifications. Interstitial and alveolar airspace opacities included lung bases better characterized on today's dedicated radiograph. Thoracolumbar levoscoliosis. IMPRESSION: 1. Nasogastric tube tip projects in gastric antrum. Electronically Signed   By: Elon Alas M.D.   On: 09/12/2018 22:13   [place] Ekg Site Rite  Result Date: 09/10/2018 If Site Rite image not attached, placement could not be confirmed due to current cardiac rhythm.   Bonnielee Haff M.D on 09/28/2018 at 12:13 PM  To page go to [email or web address] - password [facility]  [facility] -  Office  [phone]

## 2018-09-28 NOTE — Progress Notes (Signed)
**Note De-Identified  Obfuscation** Patient has negative ETT cuff leak this AM; RRT to continue to monitor.

## 2018-09-28 NOTE — Progress Notes (Signed)
[facility]   I called her daughter today to review her situation and explained that I think she needs a tracheostomy.  She voiced understanding and is agreeable to this.  Roselie Awkward, MD [place] [facility] Pager: [phone] Cell: [phone] If no response, call [phone]

## 2018-09-29 ENCOUNTER — Inpatient Hospital Stay (HOSPITAL_COMMUNITY): Payer: Medicare Other

## 2018-09-29 DIAGNOSIS — E119 Type 2 diabetes mellitus without complications: Secondary | ICD-10-CM

## 2018-09-29 DIAGNOSIS — N390 Urinary tract infection, site not specified: Secondary | ICD-10-CM

## 2018-09-29 LAB — BASIC METABOLIC PANEL
Anion gap: 10 (ref 5–15)
BUN: 101 mg/dL — ABNORMAL HIGH (ref 8–23)
CO2: 31 mmol/L (ref 22–32)
Calcium: 8.5 mg/dL — ABNORMAL LOW (ref 8.9–10.3)
Chloride: 103 mmol/L (ref 98–111)
Creatinine, Ser: 1.59 mg/dL — ABNORMAL HIGH (ref 0.44–1.00)
GFR calc Af Amer: 36 mL/min — ABNORMAL LOW (ref >60)
GFR calc non Af Amer: 31 mL/min — ABNORMAL LOW (ref >60)
Glucose, Bld: 214 mg/dL — ABNORMAL HIGH (ref 70–99)
Potassium: 4.6 mmol/L (ref 3.5–5.1)
Sodium: 144 mmol/L (ref 135–145)

## 2018-09-29 LAB — CBC
HCT: 28 % — ABNORMAL LOW (ref 36.0–46.0)
Hemoglobin: 8.3 g/dL — ABNORMAL LOW (ref 12.0–15.0)
MCH: 30 pg (ref 26.0–34.0)
MCHC: 29.6 g/dL — ABNORMAL LOW (ref 30.0–36.0)
MCV: 101.1 fL — ABNORMAL HIGH (ref 80.0–100.0)
Platelets: 526 10*3/uL — ABNORMAL HIGH (ref 150–400)
RBC: 2.77 MIL/uL — ABNORMAL LOW (ref 3.87–5.11)
RDW: 14.6 % (ref 11.5–15.5)
WBC: 11 10*3/uL — ABNORMAL HIGH (ref 4.0–10.5)
nRBC: 0 % (ref 0.0–0.2)

## 2018-09-29 LAB — GLUCOSE, CAPILLARY
Glucose-Capillary: 197 mg/dL — ABNORMAL HIGH (ref 70–99)
Glucose-Capillary: 203 mg/dL — ABNORMAL HIGH (ref 70–99)
Glucose-Capillary: 247 mg/dL — ABNORMAL HIGH (ref 70–99)
Glucose-Capillary: 267 mg/dL — ABNORMAL HIGH (ref 70–99)
Glucose-Capillary: 232 mg/dL — ABNORMAL HIGH (ref 70–99)
Glucose-Capillary: 214 mg/dL — ABNORMAL HIGH (ref 70–99)

## 2018-09-29 NOTE — Progress Notes (Signed)
NAME:  Sabrina Baker, MRN:  [id number], DOB:  [birth year], LOS: 79 ADMISSION DATE:  09/09/2018, CONSULTATION DATE:  09/09/18 REFERRING MD: Regenia Skeeter, CHIEF COMPLAINT:  SOB, diarrhea    Brief History   78 y/o F admitted 3/30 from home with c/o of cough, SOB, and abdominal pain x 2 weeks. Progressive hypoxia in ER now on NRB and CXR with bilateral infiltrates. Intubated.  COVID Positive.     Significant Hospital Events   3/30 Admitted, intubated 4/08 PSV 4 hours failed SBT due to agitation, CXR with improved aeration  4/09 Failed wean due to agitation 4/10 Hypotensive despite decreased sedation, levophed initiated 4/11 Weaned 4 hours 4/12 PSV 10/8 4/15 extubated, required re-intubation due to stridor and vocal cord edema  Consults:  Palliative care 4/5  Procedures:  3/30 ETT >>  RUE PICC 3/31 >>  ALine 4/10 >> out  Significant Diagnostic Tests:      Micro Data:  3/30 BC x 2 > negative 3/30 RVP > neg 3/30 COVID-19 >> positive 3/30 Trach asp >> normal flora 3/30 urine strep >> negative  3/30 urine legionella >> negative 4/15 Urine culture > enterococcus and citrobacter  Antimicrobials:  3/30 azithro >> completed  3/30 ceftriaxone >> completed 09/10/2018 Plaquenil >> completed  4/16 ampicillin >  4/16 ceftriaxone >   Interim history/subjective:  Intermittently agitated Did not wean this morning due to apnea but was sedated No cuff leak  Objective   Blood pressure (!) 125/59, pulse 65, temperature 98.6 F (37 C), temperature source Oral, resp. rate 16, height 5\' 3"  (1.6 m), weight 75.1 kg, SpO2 97 %.    Vent Mode: CPAP;PSV FiO2 (%):  [40 %] 40 % Set Rate:  [14 bmp] 14 bmp Vt Set:  [420 mL] 420 mL PEEP:  [5 cmH20] 5 cmH20 Pressure Support:  [14 cmH20] 14 cmH20 Plateau Pressure:  [13 cmH20-16 cmH20] 14 cmH20   Intake/Output Summary (Last 24 hours) at 09/29/2018 0831 Last data filed at 09/29/2018 5188 Gross per 24 hour  Intake 2742.21 ml  Output 2700 ml  Net  42.21 ml    Physical Exam  General:  In bed on vent HENT: NCAT ETT in place PULM: CTA B, vent supported breathing CV: RRR, no mgr GI: BS+, soft, nontender MSK: normal bulk and tone Neuro: sedated on vent    Resolved Hospital Problem list     Assessment & Plan:   Acute Hypoxemic Respiratory Failure secondary to SARS-CoV-2 ARDS : Oxygenation improved, chest x-ray about the same Stridor secondary to laryngeal edema, reintubated on 4/15 P: Will need tracheostomy Continue full mechanical ventilatory support Continue Decadron Continue daily cuff leak check Continue ventilator associated pneumonia prevention protocol Daily wake-up assessment and spontaneous breathing trial   Agitated Delirium significant anxiety: Improved Sedation Needs on Mechanical Ventilation : Resolved P: Continue PAD protocol as ordered, RASS score 0 to -1   Fever, UTI P: Continue antibiotics per [facility]  Best practice:  Diet: tube feeding Pain/Anxiety/Delirium protocol (if indicated): in place  VAP protocol (if indicated): Yes DVT prophylaxis: Eliquis   GI prophylaxis: protonix  Glucose control: Lantus, SSI  Mobility: bedrest Code Status: full Family Communication: TRH  Disposition: ICU   Labs   CBC: Recent Labs  Lab 09/23/18 0430 09/24/18 1003  09/26/18 0440 09/27/18 0430 09/28/18 0235 09/28/18 0435 09/29/18 0310  WBC 12.5* 14.5*  --  18.7* 18.1* 12.1*  --  11.0*  NEUTROABS 10.2*  --   --   --   --   --   --   --  HGB 7.9* 7.9*   < > 7.9* 7.9* 8.2* 8.5* 8.3*  HCT 26.4* 25.5*   < > 26.1* 25.5* 26.9* 25.0* 28.0*  MCV 98.1 100.0  --  101.6* 100.0 101.1*  --  101.1*  PLT 310 326  --  460* 460* 528*  --  526*   < > = values in this interval not displayed.    Basic Metabolic Panel: Recent Labs  Lab 09/23/18 0430  09/25/18 0350  09/26/18 0440 09/27/18 0430 09/28/18 0235 09/28/18 0435 09/29/18 0310  NA 145   < > 143   < > 142 143 143 140 144  K 4.1   < > 4.4   <  > 4.9 3.6 5.2* 5.3* 4.6  CL 99   < > 99  --  98 99 101  --  103  CO2 36*   < > 35*  --  34* 33* 32  --  31  GLUCOSE 60*   < > 156*  --  279* 191* 272*  --  214*  BUN 93*   < > 86*  --  93* 97* 112*  --  101*  CREATININE 1.67*   < > 1.71*  --  1.90* 1.60* 1.72*  --  1.59*  CALCIUM 10.0   < > 9.1  --  8.9 9.0 8.3*  --  8.5*  MG 2.1  --   --   --   --   --  2.4  --   --   PHOS 5.5*  --   --   --   --   --  5.5*  --   --    < > = values in this interval not displayed.   GFR: Estimated Creatinine Clearance: 28.8 mL/min (A) (by C-G formula based on SCr of 1.59 mg/dL (H)). Recent Labs  Lab 09/23/18 0430  09/25/18 0340 09/26/18 0440 09/27/18 0430 09/28/18 0235 09/29/18 0310  PROCALCITON 0.55  --  0.56 1.68  --   --   --   WBC 12.5*   < >  --  18.7* 18.1* 12.1* 11.0*   < > = values in this interval not displayed.    Liver Function Tests: Recent Labs  Lab 09/23/18 0430  AST 37  ALT 21  ALKPHOS 35*  BILITOT 0.5  PROT 5.3*  ALBUMIN 1.6*   No results for input(s): LIPASE, AMYLASE in the last 168 hours. No results for input(s): AMMONIA in the last 168 hours.  ABG    Component Value Date/Time   PHART 7.467 (H) 09/28/2018 0435   PCO2ART 47.0 09/28/2018 0435   PO2ART 69.0 (L) 09/28/2018 0435   HCO3 33.9 (H) 09/28/2018 0435   TCO2 35 (H) 09/28/2018 0435   ACIDBASEDEF 2.0 09/11/2018 0401   O2SAT 94.0 09/28/2018 0435     Coagulation Profile: Recent Labs  Lab 09/26/18 1200  INR 1.6*    Cardiac Enzymes: No results for input(s): CKTOTAL, CKMB, CKMBINDEX, TROPONINI in the last 168 hours.  HbA1C: Hgb A1c MFr Bld  Date/Time Value Ref Range Status  04/19/2012 03:43 PM 7.3 (H) <5.7 % Final    Comment:    (NOTE)  According to the ADA Clinical Practice Recommendations for 2011, when HbA1c is used as a screening test:  >=6.5%   Diagnostic of Diabetes Mellitus           (if abnormal result is confirmed)  5.7-6.4%   Increased risk of developing Diabetes Mellitus References:Diagnosis and Classification of Diabetes Mellitus,Diabetes Care,2011,34(Suppl 1):S62-S69 and Standards of Medical Care in         Diabetes - 2011,Diabetes VPXT,0626,94 (Suppl 1):S11-S61.  04/18/2012 09:20 AM 7.3 (H) <5.7 % Final    Comment:    (NOTE)                                                                       According to the ADA Clinical Practice Recommendations for 2011, when HbA1c is used as a screening test:  >=6.5%   Diagnostic of Diabetes Mellitus           (if abnormal result is confirmed) 5.7-6.4%   Increased risk of developing Diabetes Mellitus References:Diagnosis and Classification of Diabetes Mellitus,Diabetes WNIO,2703,50(KXFGH 1):S62-S69 and Standards of Medical Care in         Diabetes - 2011,Diabetes Care,2011,34 (Suppl 1):S11-S61.    CBG: Recent Labs  Lab 09/28/18 0750 09/28/18 1131 09/28/18 1951 09/29/18 0331 09/29/18 0727  GLUCAP 274* 273* 263* 197* 203*   My cc time 32 minutes   Roselie Awkward, MD [place] [facility] Pager: [phone] Cell: [phone] If no response, call [phone]

## 2018-09-29 NOTE — TOC Progression Note (Signed)
Transition of Care [facility]) - Progression Note    Patient Details  Name: Sabrina Baker MRN: [id number] Date of Birth: 22-May-1941  [facility] CM/SW Contact  Loletha Grayer Beverely Pace, RN Phone Number: 09/29/2018, 8:58 AM  Clinical Narrative:   78yr old female COVID 19 positive, remains on vent.          Expected Discharge Plan and Services    Case manager continuing to monitor for disposition when patient medically improves. May God Bless her to do so.                                  Social Determinants of Health (SDOH) Interventions    Readmission Risk Interventions No flowsheet data found.

## 2018-09-29 NOTE — Progress Notes (Signed)
Son and daughter called and update and plan of care given.

## 2018-09-29 NOTE — Progress Notes (Addendum)
PROGRESS NOTE                                                                                                                                                                                                             Patient Demographics:    Sabrina Baker, is a 78 y.o. female, DOB - 1940/10/03, REQ:[id number]  Outpatient Primary MD for the patient is [name], MD    LOS - 20  Brief Narrative: Patient is a 78 y.o. female with PMHx of HTN, DM, CKD stage III, presented with shortness of breath and cough, found to have acute hypoxic respiratory failure due to ARDS in the setting of COVID-19 infection.  Significant hospital events: 3/30 Admitted, intubated 3/31>>4/5 Plaquenil 4/13 transfer to [facility] 4/15 Extubated 4/15 Stridor-cord edema-reintubated   Subjective:  Patient noted to be less lethargic this morning.  Following commands.      Assessment  & Plan :   Acute Hypoxic Resp Failure due to Acute Covid 19 Viral Illness during the ongoing 2020 Covid 19 Pandemic/Cord edema:  She was extubated on 4/15-but then developed stridor 2/2 cord edema and was reintubated the same day. Unable to tolerate SBT due to agitation when off sedation.  Pulmonology continues to follow and manage.  Plan is for tracheostomy in the near future.  In preparations for tracheostomy she was retested and she has again tested positive for coronavirus.  Continue mechanical ventilation per pulmonology.  Urine output is noted to be about 2000 mL/day.  Chest x-ray showed improvement in the airspace disease.  Hold off on diuretics for now.  On dexamethasone for cord edema.  Fever/UTI Patient was diagnosed as having a UTI few days ago.  Her urine culture report showed enterococcus and Citrobacter.  She was started on ampicillin and ceftriaxone.  She had a fever yesterday with a temperature of 101.7 F.  Has been afebrile since then.  Continue to monitor.   According to previous notes it appears that patient was also briefly hypothermic.    Abnormal thyroid function tests TSH is 0.551 with free T4 of 4.3 and free T3 of 0.8.  Patient without any known history of thyroid disease.  Free T4 and free T3 are below the normal range.  Continue low-dose levothyroxine.  Agitation/delirium As patient was unable to tolerate SBT she was started on Klonopin and Seroquel.  Noted to be more sedated yesterday so these medication doses were adjusted.    AKI on CKD stage III AKI likely hemodynamically mediated-creatinine has improved-and now close to usual baseline.  Patient was also given potassium which caused a potassium level to go up to 5.3 yesterday.  She was just monitored.  Potassium has returned to normal levels now.   Hypotension Had episodes of hypotension previously but now noted to be hypertensive.  Continue to monitor.  She was on Levophed previously but has been titrated off.    Paroxysmal atrial fibrillation Maintaining sinus rhythm-continue Eliquis.  Macrocytic anemia Anemia could be secondary to critical illness.  No evidence for overt bleeding.  Hemoglobin is stable.  TSH as discussed above.  Check vitamin U72 and folic acid levels as well.    DM-2 Did have hypoglycemic episodes a few days back, but now CBG's on the higher side as on IV decadron for cord edema/hypothermia.  Noted to be on Lantus.  Dose of Lantus was increased yesterday for hyperglycemia.  Continue to monitor for now.  Continue SSI.  HbA1c 7.3.  Depression Resume Cymbalta when able.   Vent Settings: Vent Mode: CPAP;PSV FiO2 (%):  [40 %] 40 % Set Rate:  [14 bmp] 14 bmp Vt Set:  [420 mL] 420 mL PEEP:  [5 cmH20] 5 cmH20 Pressure Support:  [14 cmH20] 14 cmH20 Plateau Pressure:  [13 cmH20-16 cmH20] 14 cmH20  ABG:    Component Value Date/Time   PHART 7.467 (H) 09/28/2018 0435   PCO2ART 47.0 09/28/2018 0435   PO2ART 69.0 (L) 09/28/2018 0435   HCO3 33.9 (H) 09/28/2018  0435   TCO2 35 (H) 09/28/2018 0435   ACIDBASEDEF 2.0 09/11/2018 0401   O2SAT 94.0 09/28/2018 0435    Family Communication: Pulmonology has discussed with family. Code Status : Full code Diet : Tube feedings Disposition Plan : Remain in the ICU  Consults : PCCM  Procedures  :   3/30>> ETT 3/21>> RUE PICC 4/10>> A line-out 4/15>>extubated 4/15>>ETT (re-intubated)  GI prophylaxis: Protonix  DVT Prophylaxis  : Eliquis  Inpatient Medications  Scheduled Meds:  apixaban  5 mg Per Tube BID   chlorhexidine gluconate (MEDLINE KIT)  15 mL Mouth Rinse BID   Chlorhexidine Gluconate Cloth  6 each Topical Daily   dexamethasone  4 mg Intravenous Q8H   feeding supplement (PRO-STAT SUGAR FREE 64)  30 mL Per Tube TID   free water  300 mL Per Tube Q8H   gabapentin  100 mg Per Tube QHS   insulin aspart  0-15 Units Subcutaneous Q4H   insulin aspart  4 Units Subcutaneous Q4H   insulin glargine  35 Units Subcutaneous Q24H   levothyroxine  12.5 mcg Intravenous Daily   mouth rinse  15 mL Mouth Rinse 10 times per day   pantoprazole sodium  40 mg Per Tube Daily   polyethylene glycol  17 g Per Tube Daily   QUEtiapine  50 mg Oral BID   sodium chloride flush  10-40 mL Intracatheter Q12H   Continuous Infusions:  sodium chloride 10 mL/hr at 09/29/18 0621   sodium chloride 10 mL/hr at 09/29/18 0701   ampicillin (OMNIPEN) IV Stopped (09/29/18 5366)   cefTRIAXone (ROCEPHIN)  IV Stopped (09/28/18 1238)   dexmedetomidine (PRECEDEX) IV infusion 1 mcg/kg/hr (09/29/18 1106)   feeding supplement (OSMOLITE 1.2 CAL) 1,000 mL (09/29/18 1020)   norepinephrine (LEVOPHED) Adult infusion 8 mcg/min (09/25/18 1905)   PRN Meds:.sodium chloride, acetaminophen (TYLENOL) oral liquid 160 mg/5 mL, albuterol,  fentaNYL (SUBLIMAZE) injection, fentaNYL (SUBLIMAZE) injection, hydrALAZINE, sodium chloride flush  Antibiotics  :    Anti-infectives (From admission, onward)   Start     Dose/Rate Route  Frequency Ordered Stop   09/27/18 1300  cefTRIAXone (ROCEPHIN) 2 g in sodium chloride 0.9 % 100 mL IVPB     2 g 200 mL/hr over 30 Minutes Intravenous Every 24 hours 09/27/18 0928     09/26/18 1400  ampicillin (OMNIPEN) 1 g in sodium chloride 0.9 % 100 mL IVPB     1 g 300 mL/hr over 20 Minutes Intravenous Every 8 hours 09/26/18 1204     09/26/18 1300  cefTRIAXone (ROCEPHIN) 1 g in sodium chloride 0.9 % 100 mL IVPB  Status:  Discontinued     1 g 200 mL/hr over 30 Minutes Intravenous Every 24 hours 09/26/18 1204 09/27/18 0928   09/11/18 2200  hydroxychloroquine (PLAQUENIL) tablet 200 mg  Status:  Discontinued     200 mg Oral 2 times daily 09/10/18 2309 09/10/18 2319   09/11/18 2200  hydroxychloroquine (PLAQUENIL) tablet 200 mg     200 mg Per Tube 2 times daily 09/10/18 2319 09/15/18 0851   09/10/18 2330  hydroxychloroquine (PLAQUENIL) tablet 400 mg     400 mg Per Tube 2 times daily 09/10/18 2319 09/11/18 0823   09/10/18 2315  hydroxychloroquine (PLAQUENIL) tablet 400 mg  Status:  Discontinued     400 mg Oral 2 times daily 09/10/18 2309 09/10/18 2319   09/10/18 1300  azithromycin (ZITHROMAX) 500 mg in sodium chloride 0.9 % 250 mL IVPB     500 mg 250 mL/hr over 60 Minutes Intravenous Every 24 hours 09/09/18 1342 09/15/18 1531   09/10/18 1200  cefTRIAXone (ROCEPHIN) 1 g in sodium chloride 0.9 % 100 mL IVPB  Status:  Discontinued     1 g 200 mL/hr over 30 Minutes Intravenous Every 24 hours 09/09/18 1342 09/14/18 1436   09/09/18 1200  cefTRIAXone (ROCEPHIN) 1 g in sodium chloride 0.9 % 100 mL IVPB     1 g 200 mL/hr over 30 Minutes Intravenous  Once 09/09/18 1148 09/09/18 1248   09/09/18 1200  azithromycin (ZITHROMAX) 500 mg in sodium chloride 0.9 % 250 mL IVPB     500 mg 250 mL/hr over 60 Minutes Intravenous  Once 09/09/18 1148 09/09/18 1351        Objective:   Vitals:   09/29/18 0800 09/29/18 0835 09/29/18 0900 09/29/18 1000  BP: 117/61  139/62 (!) 133/59  Pulse: 64  63 61  Resp: (!)  _0 Temp:      TempSrc:      SpO2: 95% 97% 97% 98%  Weight:      Height:        Wt Readings from Last 3 Encounters:  09/23/18 75.1 kg  06/21/17 70.3 kg  01/24/16 67.1 kg     Intake/Output Summary (Last 24 hours) at 09/29/2018 1107 Last data filed at 09/29/2018 1000 Gross per 24 hour  Intake 2608.19 ml  Output 2275 ml  Net 333.19 ml     Physical Exam  General appearance: Patient remains on mechanical ventilation.  Intubated.  More awake and alert today.  Following commands.   Resp: Coarse breath sounds bilaterally.  Few crackles at the bases.  No wheezing or rhonchi.   Cardio: S1-S2 is normal regular.  No S3-S4.  No rubs murmurs or bruit GI: Abdomen is soft.  Nontender nondistended.  Bowel sounds are present normal.  No masses  organomegaly Extremities: No edema.  Full range of motion of lower extremities. Neurologic: Responds to voice commands.  Moving all her extremities.      Data Review:    CBC Recent Labs  Lab 09/23/18 0430 09/24/18 1003  09/26/18 0440 09/27/18 0430 09/28/18 0235 09/28/18 0435 09/29/18 0310  WBC 12.5* 14.5*  --  18.7* 18.1* 12.1*  --  11.0*  HGB 7.9* 7.9*   < > 7.9* 7.9* 8.2* 8.5* 8.3*  HCT 26.4* 25.5*   < > 26.1* 25.5* 26.9* 25.0* 28.0*  PLT 310 326  --  460* 460* 528*  --  526*  MCV 98.1 100.0  --  101.6* 100.0 101.1*  --  101.1*  MCH 29.4 31.0  --  30.7 31.0 30.8  --  30.0  MCHC 29.9* 31.0  --  30.3 31.0 30.5  --  29.6*  RDW 14.9 15.1  --  14.9 14.4 15.0  --  14.6  LYMPHSABS 0.9  --   --   --   --   --   --   --   MONOABS 1.0  --   --   --   --   --   --   --   EOSABS 0.3  --   --   --   --   --   --   --   BASOSABS 0.0  --   --   --   --   --   --   --    < > = values in this interval not displayed.    Chemistries  Recent Labs  Lab 09/23/18 0430  09/25/18 0350  09/26/18 0440 09/27/18 0430 09/28/18 0235 09/28/18 0435 09/29/18 0310  NA 145   < > 143   < > 142 143 143 140 144  K 4.1   < > 4.4   < > 4.9 3.6 5.2* 5.3*  4.6  CL 99   < > 99  --  98 99 101  --  103  CO2 36*   < > 35*  --  34* 33* 32  --  31  GLUCOSE 60*   < > 156*  --  279* 191* 272*  --  214*  BUN 93*   < > 86*  --  93* 97* 112*  --  101*  CREATININE 1.67*   < > 1.71*  --  1.90* 1.60* 1.72*  --  1.59*  CALCIUM 10.0   < > 9.1  --  8.9 9.0 8.3*  --  8.5*  MG 2.1  --   --   --   --   --  2.4  --   --   AST 37  --   --   --   --   --   --   --   --   ALT 21  --   --   --   --   --   --   --   --   ALKPHOS 35*  --   --   --   --   --   --   --   --   BILITOT 0.5  --   --   --   --   --   --   --   --    < > = values in this interval not displayed.     Lab Results  Component Value Date   HGBA1C 7.3 (H) 04/19/2012  Recent Labs    09/27/18 1150  TSH 0.551  T4TOTAL 4.3*  T3FREE 0.8*    Coagulation profile Recent Labs  Lab 09/26/18 1200  INR 1.6*        Component Value Date/Time   BNP 278.8 (H) 09/25/2018 0350    Micro Results Recent Results (from the past 240 hour(s))  Culture, Urine     Status: Abnormal   Collection Time: 09/22/18  4:00 PM  Result Value Ref Range Status   Specimen Description URINE, CATHETERIZED  Final   Special Requests   Final    Normal Performed at [facility], [place] [address] [place], [place] [address]    Culture (A)  Final    >=100,000 COLONIES/mL ENTEROCOCCUS FAECALIS 10,000 COLONIES/mL CITROBACTER FREUNDII    Report Status 09/24/2018 FINAL  Final   Organism ID, Bacteria ENTEROCOCCUS FAECALIS (A)  Final   Organism ID, Bacteria CITROBACTER FREUNDII (A)  Final      Susceptibility   Citrobacter freundii - MIC*    CEFAZOLIN >=64 RESISTANT Resistant     CEFTRIAXONE <=1 SENSITIVE Sensitive     CIPROFLOXACIN <=0.25 SENSITIVE Sensitive     GENTAMICIN <=1 SENSITIVE Sensitive     IMIPENEM <=0.25 SENSITIVE Sensitive     NITROFURANTOIN <=16 SENSITIVE Sensitive     TRIMETH/SULFA <=20 SENSITIVE Sensitive     PIP/TAZO <=4 SENSITIVE Sensitive     * 10,000 COLONIES/mL CITROBACTER FREUNDII    Enterococcus faecalis - MIC*    AMPICILLIN <=2 SENSITIVE Sensitive     LEVOFLOXACIN 2 SENSITIVE Sensitive     NITROFURANTOIN <=16 SENSITIVE Sensitive     VANCOMYCIN 2 SENSITIVE Sensitive     * >=100,000 COLONIES/mL ENTEROCOCCUS FAECALIS  Culture, respiratory (non-expectorated)     Status: None   Collection Time: 09/22/18  4:00 PM  Result Value Ref Range Status   Specimen Description TRACHEAL ASPIRATE  Final   Special Requests Normal  Final   Gram Stain   Final    MODERATE WBC PRESENT,BOTH PMN AND MONONUCLEAR MODERATE GRAM POSITIVE COCCI IN PAIRS IN CLUSTERS MODERATE GRAM NEGATIVE RODS Performed at [facility], [address], [place], [place] [address]    Culture FEW KLEBSIELLA PNEUMONIAE  Final   Report Status 09/25/2018 FINAL  Final   Organism ID, Bacteria KLEBSIELLA PNEUMONIAE  Final      Susceptibility   Klebsiella pneumoniae - MIC*    AMPICILLIN RESISTANT Resistant     CEFAZOLIN <=4 SENSITIVE Sensitive     CEFEPIME <=1 SENSITIVE Sensitive     CEFTAZIDIME <=1 SENSITIVE Sensitive     CEFTRIAXONE <=1 SENSITIVE Sensitive     CIPROFLOXACIN <=0.25 SENSITIVE Sensitive     GENTAMICIN <=1 SENSITIVE Sensitive     IMIPENEM <=0.25 SENSITIVE Sensitive     TRIMETH/SULFA <=20 SENSITIVE Sensitive     AMPICILLIN/SULBACTAM 4 SENSITIVE Sensitive     PIP/TAZO <=4 SENSITIVE Sensitive     Extended ESBL NEGATIVE Sensitive     * FEW KLEBSIELLA PNEUMONIAE  Culture, blood (routine x 2)     Status: None   Collection Time: 09/22/18  4:13 PM  Result Value Ref Range Status   Specimen Description BLOOD LEFT ANTECUBITAL  Final   Special Requests   Final    BOTTLES DRAWN AEROBIC ONLY Blood Culture adequate volume   Culture   Final    NO GROWTH 5 DAYS Performed at [facility], [address] [place], [place] [address]    Report Status 09/27/2018 FINAL  Final  Culture, blood (routine x 2)     Status: None   Collection Time: 09/22/18  6:25 PM  Result Value Ref Range Status   Specimen  Description BLOOD LEFT HAND  Final   Special Requests   Final    BOTTLES DRAWN AEROBIC AND ANAEROBIC Blood Culture adequate volume   Culture   Final    NO GROWTH 5 DAYS Performed at [facility], [address] [place], [place] [address]    Report Status 09/27/2018 FINAL  Final  Culture, blood (routine x 2)     Status: None (Preliminary result)   Collection Time: 09/25/18  8:05 AM  Result Value Ref Range Status   Specimen Description   Final    BLOOD LEFT ANTECUBITAL Performed at [place] [address], [place], [place] [address]    Special Requests   Final    BOTTLES DRAWN AEROBIC AND ANAEROBIC Blood Culture adequate volume Performed at [place] [address], [place], [place] [address]    Culture   Final    NO GROWTH 4 DAYS Performed at [facility], [place] [address] [place], [place] [address]    Report Status PENDING  Incomplete  Culture, blood (routine x 2)     Status: None (Preliminary result)   Collection Time: 09/25/18  8:17 AM  Result Value Ref Range Status   Specimen Description   Final    BLOOD LEFT ANTECUBITAL Performed at [place] [address], [place], [place] [address]    Special Requests   Final    BOTTLES DRAWN AEROBIC AND ANAEROBIC Blood Culture adequate volume Performed at [place] [address] [place], [place] [address]    Culture   Final    NO GROWTH 4 DAYS Performed at [facility], [place] [address], [place], [place] [address]    Report Status PENDING  Incomplete  Culture, Urine     Status: Abnormal   Collection Time: 09/25/18  9:03 AM  Result Value Ref Range Status   Specimen Description   Final    URINE, RANDOM Performed at [place] [address], [place], [place] [address]    Special Requests   Final    NONE Performed at [facility], [place] [address], [place], [place] [address]     Culture (A)  Final    >=100,000 COLONIES/mL CITROBACTER FREUNDII >=100,000 COLONIES/mL ENTEROCOCCUS FAECALIS    Report Status 09/27/2018 FINAL  Final   Organism ID, Bacteria CITROBACTER FREUNDII (A)  Final   Organism ID, Bacteria ENTEROCOCCUS FAECALIS (A)  Final      Susceptibility   Citrobacter freundii - MIC*    CEFAZOLIN >=64 RESISTANT Resistant     CEFTRIAXONE <=1 SENSITIVE Sensitive     CIPROFLOXACIN <=0.25 SENSITIVE Sensitive     GENTAMICIN <=1 SENSITIVE Sensitive     IMIPENEM 0.5 SENSITIVE Sensitive     NITROFURANTOIN 32 SENSITIVE Sensitive     TRIMETH/SULFA <=20 SENSITIVE Sensitive     PIP/TAZO <=4 SENSITIVE Sensitive     * >=100,000 COLONIES/mL CITROBACTER FREUNDII   Enterococcus faecalis - MIC*    AMPICILLIN <=2 SENSITIVE Sensitive     LEVOFLOXACIN 2 SENSITIVE Sensitive     NITROFURANTOIN <=16 SENSITIVE Sensitive     VANCOMYCIN 2 SENSITIVE Sensitive     * >=100,000 COLONIES/mL ENTEROCOCCUS FAECALIS  Culture, respiratory (non-expectorated)     Status: None (Preliminary result)   Collection Time:  09/28/18 11:06 AM  Result Value Ref Range Status   Specimen Description   Final    TRACHEAL ASPIRATE Performed at [place] [address] [place], [place] [address]    Special Requests   Final    Normal Performed at [facility], [place] [address], [place], [place] [address]    Gram Stain   Final    RARE WBC PRESENT, PREDOMINANTLY PMN RARE GRAM POSITIVE COCCI RARE GRAM POSITIVE RODS Performed at [facility], [place] [address], [place], [place] [address]    Culture PENDING  Incomplete   Report Status PENDING  Incomplete  SARS Coronavirus 2 [facility] order, Performed in [place] hospital lab)     Status: Abnormal   Collection Time: 09/28/18 11:18 AM  Result Value Ref Range Status   SARS Coronavirus 2 POSITIVE (A) NEGATIVE Corrected    Comment: RESULT CALLED TO, READ BACK BY AND VERIFIED WITH: TETREAULT, H RN _0  ON  09/28/2018 JACKSON,K Performed at [facility], [place] [address] [place], [place] [address] CORRECTED ON 04/18 AT 1954: PREVIOUSLY REPORTED AS POSITIVE CRITICAL RESULT CALLED TO, READ BACK BY AND VERIFIED WITH: Quentin Angst RN _1  ON 09/28/2018 [facility]     Radiology Reports Dg Chest Port 1 View  Result Date: 09/29/2018 CLINICAL DATA:  Acute hypoxia due to COVID 19 Fever 101.7 yesterday EXAM: PORTABLE CHEST 1 VIEW COMPARISON:  09/26/2018 FINDINGS: Endotracheal tube, NG tube, PICC line unchanged. Stable cardiac silhouette. Low lung volumes. Some improvement in pulmonary parenchymal opacities. IMPRESSION: 1. Mild improvement pulmonary parenchymal opacities. 2. Persistent low lung volumes. 3. Stable support apparatus. Electronically Signed   By: Suzy Bouchard M.D.   On: 09/29/2018 06:30   Dg Chest Port 1 View  Result Date: 09/26/2018 CLINICAL DATA:  Shortness of breath. EXAM: PORTABLE CHEST 1 VIEW COMPARISON:  One-view chest x-ray 09/25/2018 FINDINGS: The heart size is exaggerate by low lung volumes. The endotracheal tube has been pulled back slightly, now terminating 2.5 cm above the carina. A right-sided PICC line is stable. The NG tube courses off the inferior border of the film. Bilateral patchy interstitial and airspace disease is again noted. There is less right middle lobe consolidation. No definite effusions are present. IMPRESSION: 1. Improved aeration of the right middle lobe. 2. Persistent patchy interstitial and airspace disease bilaterally is worrisome infection or edema. 3. Repositioning of endotracheal tube, now 2.5 cm above the carina. Electronically Signed   By: San Morelle M.D.   On: 09/26/2018 07:52   Dg Chest Port 1 View  Result Date: 09/25/2018 CLINICAL DATA:  78 year old female with a history of intubation EXAM: PORTABLE CHEST 1 VIEW COMPARISON:  09/24/2018 FINDINGS: Endotracheal tube now terminates at the carina. Tube may be withdrawn 6 cm-7 cm for  better positioning. Right upper extremity PICC. Gastric tube terminates out of the field of view. Low lung volumes with similar appearance of reticulonodular opacity throughout with increasing airspace opacity in the right mid lung. Persistently low lung volumes.  No pneumothorax or pleural effusion. IMPRESSION: Endotracheal tube terminates at the carina and may be withdrawn 6 cm-7 cm for better positioning. Low lung volumes persist with mixed interstitial and airspace opacities bilaterally, with increasing airspace opacity in the right mid lung. Unchanged right upper extremity PICC and gastric tube. Electronically Signed   By: Corrie Mckusick D.O.   On: 09/25/2018 19:11   Dg Chest Port 1 View  Result Date: 09/24/2018 CLINICAL DATA:  Short of breath EXAM:  PORTABLE CHEST 1 VIEW COMPARISON:  09/23/2018 FINDINGS: Normal heart size. Endotracheal tube, NG tube, and right PICC are stable. Patchy airspace opacities throughout both lungs are not significantly changed. No pneumothorax. IMPRESSION: Stable patchy airspace disease throughout both lungs. Electronically Signed   By: Marybelle Killings M.D.   On: 09/24/2018 07:41   Dg Chest Port 1 View  Result Date: 09/23/2018 CLINICAL DATA:  Respiratory failure. EXAM: PORTABLE CHEST 1 VIEW COMPARISON:  One-view chest x-ray 09/21/2018 FINDINGS: The heart size is normal. Endotracheal tube is stable. NG tube courses off the inferior border the film. Right-sided PICC line is stable and in satisfactory position. Bilateral interstitial and airspace disease is similar the prior exam. There is improved aeration at the left base. Increased airspace opacities are noted in the right middle and upper lobes. IMPRESSION: 1. Bilateral interstitial and airspace disease concerning for infection. There is slight increase on the right and some improvement on the left. 2. Support apparatus is stable. Electronically Signed   By: San Morelle M.D.   On: 09/23/2018 07:41   Dg Chest Port 1  View  Result Date: 09/21/2018 CLINICAL DATA:  Respiratory failure. EXAM: PORTABLE CHEST 1 VIEW COMPARISON:  Radiograph of September 21, 2018. FINDINGS: Stable cardiomediastinal silhouette. Endotracheal and nasogastric tubes are in grossly good position. Right-sided PICC line is unchanged. No pneumothorax or significant pleural effusion is noted. Stable bilateral lung opacities are noted concerning for pneumonia or possibly edema. Bony thorax is unremarkable. IMPRESSION: Stable support apparatus. Stable bilateral lung opacities as described above. Electronically Signed   By: Marijo Conception, M.D.   On: 09/21/2018 22:03   Dg Chest Port 1 View  Result Date: 09/21/2018 CLINICAL DATA:  Respiratory failure EXAM: PORTABLE CHEST 1 VIEW COMPARISON:  09/20/2018 FINDINGS: Endotracheal tube in good position. Right arm PICC tip in the proximal SVC unchanged. NG tube in the stomach. Mild improvement in bilateral airspace disease. Small left effusion. IMPRESSION: Endotracheal tube in good position Diffuse bilateral airspace disease with mild improvement. Electronically Signed   By: Franchot Gallo M.D.   On: 09/21/2018 08:10   Dg Chest Port 1 View  Result Date: 09/20/2018 CLINICAL DATA:  Respiratory failure.  Ventilator support. EXAM: PORTABLE CHEST 1 VIEW COMPARISON:  09/18/2018 FINDINGS: Endotracheal tube tip is 4 cm above the carina. Orogastric tube enters the abdomen. Bilateral patchy pulmonary infiltrates appear quite similar, with slightly more volume loss at the right lung base. No new finding otherwise. IMPRESSION: Persistent patchy bilateral pulmonary infiltrates. Slight worsening of volume loss at the right lung base. Electronically Signed   By: Nelson Chimes M.D.   On: 09/20/2018 06:11   Dg Chest Port 1 View  Result Date: 09/18/2018 CLINICAL DATA:  ARDS, COVID+ Pt intubated on airborne and contact PPE: Gloves, gown, N95, goggles, bouffant. EXAM: PORTABLE CHEST - 1 VIEW COMPARISON:  09/16/2018 FINDINGS:  Endotracheal tube, nasogastric tube, right arm PICC stable. Slight improvement in right lower lung airspace disease with patchy hazy infiltrates in the left mid and lower lung and right upper lung as before. Heart size and mediastinal contours are within normal limits. No effusion. No pneumothorax. Fixation hardware in the proximal right humerus. IMPRESSION: 1. Patchy bilateral airspace disease, with slight improvement in right lower lung opacities since prior study. 2. Support hardware stable in position. Electronically Signed   By: Lucrezia Europe M.D.   On: 09/18/2018 08:05   Dg Chest Port 1 View  Result Date: 09/16/2018 CLINICAL DATA:  Respiratory failure EXAM: PORTABLE CHEST 1  VIEW COMPARISON:  Yesterday FINDINGS: Endotracheal tube tip between the clavicular heads and carina. The orogastric tube at least reaches the stomach. Right-sided central line with tip at the SVC. No appreciable change in patchy bilateral interstitial and airspace opacity. Stable heart size. No evidence of air leak or pleural fluid. IMPRESSION: 1. Unchanged multifocal pneumonia. 2. Stable hardware positioning. Electronically Signed   By: Monte Fantasia M.D.   On: 09/16/2018 07:09   Dg Chest Port 1 View  Result Date: 09/15/2018 CLINICAL DATA:  Respiratory failure. Intubated patient. Follow-up exam. EXAM: PORTABLE CHEST 1 VIEW COMPARISON:  Prior studies, most recent 09/14/2018 FINDINGS: Cardiac silhouette is normal size.  No mediastinal or hilar masses. There are hazy bilateral airspace lung opacities which are without change from the previous day's exam. No new lung abnormalities. No convincing pleural effusion and no pneumothorax. Endotracheal tube, nasal/orogastric tube and right PICC are stable. IMPRESSION: 1. No change from previous day's study. 2. Persistent bilateral ground-glass lung opacities. 3. Stable well-positioned support apparatus. Electronically Signed   By: Lajean Manes M.D.   On: 09/15/2018 06:47   Dg Chest Port 1  View  Result Date: 09/14/2018 CLINICAL DATA:  Respiratory failure.  COVID-19. EXAM: PORTABLE CHEST 1 VIEW COMPARISON:  09/13/2018 and prior studies dating back to 09/09/2018. FINDINGS: Bilateral ground-glass lung opacities are without significant change from the prior exams. Atelectasis at the left lung base is stable. More confluent opacity at the right lung base is less prominent, which appears to be due to larger lung volumes on the current exam. No new lung abnormalities.  No pleural effusion or pneumothorax. Endotracheal tube, nasal/orogastric tube and right PICC are stable in well positioned. IMPRESSION: 1. No significant change from the previous day's study. 2. Persistent bilateral ground-glass lung opacities more confluent basilar opacities. 3. Stable well-positioned support apparatus. Electronically Signed   By: Lajean Manes M.D.   On: 09/14/2018 06:24   Dg Chest Port 1 View  Result Date: 09/13/2018 CLINICAL DATA:  Respiratory failure EXAM: PORTABLE CHEST 1 VIEW COMPARISON:  Yesterday FINDINGS: Endotracheal tube tip between the clavicular heads and carina. The orogastric tube has been advanced and now at least reaches the stomach-as confirmed on interval KUB. Right PICC with tip at the SVC. Patchy bilateral airspace disease. Borderline heart size. No effusion or pneumothorax. IMPRESSION: Stable hardware positioning and bilateral pneumonia. Electronically Signed   By: Monte Fantasia M.D.   On: 09/13/2018 06:08   Dg Chest Port 1 View  Result Date: 09/12/2018 CLINICAL DATA:  History of endotracheal tube EXAM: PORTABLE CHEST 1 VIEW COMPARISON:  Yesterday FINDINGS: Endotracheal tube tip between the clavicular heads and carina. The orogastric tube tip is in the region of the GE junction. Right PICC with tip near the SVC origin. Patchy bilateral lung opacity with both interstitial and airspace appearance. No effusion or pneumothorax. Normal heart size. These results will be called to the ordering  clinician or representative by the Radiologist Assistant, and communication documented in the PACS or zVision Dashboard. IMPRESSION: 1. Shortened orogastric tube with tip now at the GE junction. 2. Other hardware positioning is stable. 3. Bilateral lung opacity with progression. Electronically Signed   By: Monte Fantasia M.D.   On: 09/12/2018 07:14   Dg Chest Port 1 View  Result Date: 09/11/2018 CLINICAL DATA:  Respiratory failure EXAM: PORTABLE CHEST 1 VIEW COMPARISON:  09/10/2018 FINDINGS: Endotracheal tube and NG tube are stable. Interval placement of right PICC line with the tip in the SVC. Patchy bilateral airspace  disease similar to prior study. Heart is normal size. No visible effusions or acute bony abnormality. IMPRESSION: Right PICC line tip in the SVC. Remainder of the support devices stable. Stable patchy bilateral airspace disease. Electronically Signed   By: Rolm Baptise M.D.   On: 09/11/2018 07:36   Dg Chest Port 1 View  Result Date: 09/10/2018 CLINICAL DATA:  Respiratory failure EXAM: PORTABLE CHEST 1 VIEW COMPARISON:  09/09/2018 FINDINGS: Support devices are stable. Heart is normal size. Patchy bilateral airspace disease again noted, not significantly changed. No visible effusions or acute bony abnormality. IMPRESSION: Patchy bilateral airspace disease, not significantly changed. Electronically Signed   By: Rolm Baptise M.D.   On: 09/10/2018 07:39   Dg Chest Port 1 View  Result Date: 09/09/2018 CLINICAL DATA:  Respiratory failure. EXAM: PORTABLE CHEST 1 VIEW COMPARISON:  Chest x-ray from same day at 1 a.m. FINDINGS: Interval placement of an endotracheal tube with the tip 3.9 cm above the carina. Enteric tube entering the stomach with the tip below the field of view. The heart size and mediastinal contours are within normal limits. Patchy asymmetric, hazy opacities in both lungs again noted, slightly more consolidative appearance in the left mid lung and right lower lobe. No pleural  effusion or pneumothorax. No acute osseous abnormality. IMPRESSION: 1. Appropriately positioned endotracheal tube. 2. Patchy asymmetric airspace disease in both lungs again noted, with slightly more focal consolidative appearance in the left mid lung and right lower lobe. Findings are nonspecific, but concerning for atypical infection, including potential viral pneumonia. Electronically Signed   By: Titus Dubin M.D.   On: 09/09/2018 17:38   Dg Chest Port 1 View  Result Date: 09/09/2018 CLINICAL DATA:  Short of breath, cough.  Diarrhea. EXAM: PORTABLE CHEST 1 VIEW COMPARISON:  Chest radiograph 05/29/2012 FINDINGS: Normal cardiac silhouette. There is bilateral fine airspace disease in the upper lobes. No pleural fluid. No pneumothorax. No acute osseous abnormality. RIGHT shoulder internal fixation. IMPRESSION: Bilateral upper lobe airspace disease representing multifocal pneumonia versus pulmonary edema. Electronically Signed   By: Suzy Bouchard M.D.   On: 09/09/2018 11:27   Dg Abd Portable 1v  Result Date: 09/25/2018 CLINICAL DATA:  78 y/o  F; intubation, NG placement. EXAM: PORTABLE ABDOMEN - 1 VIEW COMPARISON:  09/12/2018 abdomen radiographs. FINDINGS: Enteric tube tip projects over the gastric body. Normal bowel gas pattern. Ill-defined patchy opacities in lung bases. No acute osseous abnormality is evident. IMPRESSION: Enteric tube tip projects over the gastric body. Normal bowel gas pattern. Electronically Signed   By: Kristine Garbe M.D.   On: 09/25/2018 19:19   Dg Abd Portable 1v  Result Date: 09/12/2018 CLINICAL DATA:  Orogastric tube placement. EXAM: PORTABLE ABDOMEN - 1 VIEW COMPARISON:  CT abdomen and pelvis June 24, 2018 FINDINGS: Nasogastric tube tip projects in gastric antrum. No intra-abdominal mass effect or pathologic calcifications. Included bowel gas pattern is nondilated and nonobstructive. Mild vascular calcifications. Interstitial and alveolar airspace opacities  included lung bases better characterized on today's dedicated radiograph. Thoracolumbar levoscoliosis. IMPRESSION: 1. Nasogastric tube tip projects in gastric antrum. Electronically Signed   By: Elon Alas M.D.   On: 09/12/2018 22:13   [place] Ekg Site Rite  Result Date: 09/10/2018 If Site Rite image not attached, placement could not be confirmed due to current cardiac rhythm.   Bonnielee Haff M.D  To page go to [email or web address] - password [facility]  [facility] -  Office  [phone]

## 2018-09-30 DIAGNOSIS — R061 Stridor: Secondary | ICD-10-CM

## 2018-09-30 DIAGNOSIS — I10 Essential (primary) hypertension: Secondary | ICD-10-CM

## 2018-09-30 LAB — CBC
HCT: 29.4 % — ABNORMAL LOW (ref 36.0–46.0)
Hemoglobin: 8.9 g/dL — ABNORMAL LOW (ref 12.0–15.0)
MCH: 30.6 pg (ref 26.0–34.0)
MCHC: 30.3 g/dL (ref 30.0–36.0)
MCV: 101 fL — ABNORMAL HIGH (ref 80.0–100.0)
Platelets: 557 10*3/uL — ABNORMAL HIGH (ref 150–400)
RBC: 2.91 MIL/uL — ABNORMAL LOW (ref 3.87–5.11)
RDW: 14.4 % (ref 11.5–15.5)
WBC: 12.1 10*3/uL — ABNORMAL HIGH (ref 4.0–10.5)
nRBC: 0 % (ref 0.0–0.2)

## 2018-09-30 LAB — GLUCOSE, CAPILLARY
Glucose-Capillary: 222 mg/dL — ABNORMAL HIGH (ref 70–99)
Glucose-Capillary: 198 mg/dL — ABNORMAL HIGH (ref 70–99)
Glucose-Capillary: 258 mg/dL — ABNORMAL HIGH (ref 70–99)
Glucose-Capillary: 222 mg/dL — ABNORMAL HIGH (ref 70–99)
Glucose-Capillary: 265 mg/dL — ABNORMAL HIGH (ref 70–99)
Glucose-Capillary: 159 mg/dL — ABNORMAL HIGH (ref 70–99)
Glucose-Capillary: 48 mg/dL — ABNORMAL LOW (ref 70–99)
Glucose-Capillary: 61 mg/dL — ABNORMAL LOW (ref 70–99)
Glucose-Capillary: 84 mg/dL (ref 70–99)
Glucose-Capillary: 80 mg/dL (ref 70–99)

## 2018-09-30 LAB — CULTURE, BLOOD (ROUTINE X 2)
Culture: NO GROWTH
Culture: NO GROWTH
Special Requests: ADEQUATE
Special Requests: ADEQUATE

## 2018-09-30 LAB — BASIC METABOLIC PANEL
Anion gap: 6 (ref 5–15)
BUN: 85 mg/dL — ABNORMAL HIGH (ref 8–23)
CO2: 32 mmol/L (ref 22–32)
Calcium: 8.2 mg/dL — ABNORMAL LOW (ref 8.9–10.3)
Chloride: 107 mmol/L (ref 98–111)
Creatinine, Ser: 1.24 mg/dL — ABNORMAL HIGH (ref 0.44–1.00)
GFR calc Af Amer: 49 mL/min — ABNORMAL LOW (ref >60)
GFR calc non Af Amer: 42 mL/min — ABNORMAL LOW (ref >60)
Glucose, Bld: 203 mg/dL — ABNORMAL HIGH (ref 70–99)
Potassium: 4.2 mmol/L (ref 3.5–5.1)
Sodium: 145 mmol/L (ref 135–145)

## 2018-09-30 MED ORDER — CLONIDINE HCL 0.1 MG PO TABS
0.1000 mg | ORAL_TABLET | Freq: Once | ORAL | Status: AC
  Administered 2018-09-30: 0.1 mg via ORAL
  Filled 2018-09-30: qty 1

## 2018-09-30 MED ORDER — DEXTROSE 50 % IV SOLN
12.5000 g | INTRAVENOUS | Status: AC
  Administered 2018-09-30: 12.5 g via INTRAVENOUS

## 2018-09-30 MED ORDER — DEXTROSE 50 % IV SOLN
INTRAVENOUS | Status: AC
  Filled 2018-09-30: qty 50

## 2018-09-30 NOTE — Progress Notes (Signed)
Pt CBG 1945 was 48. Implemented hypoglycemic sidebar. Pt is asymptomatic and reporting she feels fine. Administered D50. Will continue to assess patient.

## 2018-09-30 NOTE — Progress Notes (Signed)
Called and spoke with patient's daughter, Arrie Aran, to updated her on patient's current status.

## 2018-09-30 NOTE — Progress Notes (Signed)
NAME:  Sabrina Baker, MRN:  [id number], DOB:  1940/11/01, LOS: 21 ADMISSION DATE:  09/09/2018, CONSULTATION DATE:  09/09/18 REFERRING MD: Regenia Skeeter, CHIEF COMPLAINT:  SOB, diarrhea    Brief History   78 y/o F admitted 3/30 from home with c/o of cough, SOB, and abdominal pain x 2 weeks. Progressive hypoxia in ER now on NRB and CXR with bilateral infiltrates. Intubated.  COVID Positive.     Significant Hospital Events   3/30 Admitted, intubated 4/08 PSV 4 hours failed SBT due to agitation, CXR with improved aeration  4/09 Failed wean due to agitation 4/10 Hypotensive despite decreased sedation, levophed initiated 4/11 Weaned 4 hours 4/12 PSV 10/8 4/15 extubated, required re-intubation due to stridor and vocal cord edema  Consults:  Palliative care 4/5  Procedures:  3/30 ETT >>  RUE PICC 3/31 >>  ALine 4/10 >> out  Significant Diagnostic Tests:      Micro Data:  3/30 BC x 2 > negative 3/30 RVP > neg 3/30 COVID-19 >> positive 3/30 Trach asp >> normal flora 3/30 urine strep >> negative  3/30 urine legionella >> negative 4/15 Urine culture > enterococcus and citrobacter  Antimicrobials:  3/30 azithro >> completed  3/30 ceftriaxone >> completed 09/10/2018 Plaquenil >> completed  4/16 ampicillin >  4/16 ceftriaxone >   Interim history/subjective:  Weaning this AM, cuff leak reported by RT  Objective   Blood pressure (!) 180/97, pulse 72, temperature 97.9 F (36.6 C), temperature source Oral, resp. rate (!) 25, height 5\' 3"  (1.6 m), weight 75.1 kg, SpO2 95 %.    Vent Mode: PSV;CPAP FiO2 (%):  [40 %] 40 % Set Rate:  [14 bmp] 14 bmp Vt Set:  [420 mL] 420 mL PEEP:  [5 cmH20] 5 cmH20 Pressure Support:  [5 cmH20-10 cmH20] 5 cmH20 Plateau Pressure:  [9 cmH20-17 cmH20] 14 cmH20   Intake/Output Summary (Last 24 hours) at 09/30/2018 9678 Last data filed at 09/30/2018 0500 Gross per 24 hour  Intake 1785.91 ml  Output 2330 ml  Net -544.09 ml   Physical Exam  General:   In bed, weaning, NAD, cuff leak present on my exam as well HENT: [place]/AT, PERRL, EOM-I and ETT is in place PULM: Decreased BS diffusely CV: RRR, Nl S1/S2 and -M/R/G GI: Soft, NT, ND and +BS MSK: normal bulk and tone Neuro: Alert and interactive, moving all ext to command  I reviewed CXR myself, ETT is in a good position  Resolved Hospital Problem list     Assessment & Plan:   Acute respiratory failure:  - Attempt extubation today  - If fails then will reintubate and trach  - SLP  - IS  - OOB  - Flutter valve  - Continue decadron for one more day  Hemodynamics:  - Tele monitoring  - D/C pressors  Sedation:  - D/C sedation  - PRN fentanyl only   - Monitor for signs of withdrawal  Rest per TRH-MD  Best practice:  Diet: tube feeding Pain/Anxiety/Delirium protocol (if indicated): in place  VAP protocol (if indicated): Yes DVT prophylaxis: Eliquis   GI prophylaxis: protonix  Glucose control: Lantus, SSI  Mobility: bedrest Code Status: full Family Communication: TRH  Disposition: ICU   Labs   CBC: Recent Labs  Lab 09/26/18 0440 09/27/18 0430 09/28/18 0235 09/28/18 0435 09/29/18 0310 09/30/18 0500  WBC 18.7* 18.1* 12.1*  --  11.0* 12.1*  HGB 7.9* 7.9* 8.2* 8.5* 8.3* 8.9*  HCT 26.1* 25.5* 26.9* 25.0* 28.0* 29.4*  MCV  101.6* 100.0 101.1*  --  101.1* 101.0*  PLT 460* 460* 528*  --  526* 557*    Basic Metabolic Panel: Recent Labs  Lab 09/26/18 0440 09/27/18 0430 09/28/18 0235 09/28/18 0435 09/29/18 0310 09/30/18 0500  NA 142 143 143 140 144 145  K 4.9 3.6 5.2* 5.3* 4.6 4.2  CL 98 99 101  --  103 107  CO2 34* 33* 32  --  31 32  GLUCOSE 279* 191* 272*  --  214* 203*  BUN 93* 97* 112*  --  101* 85*  CREATININE 1.90* 1.60* 1.72*  --  1.59* 1.24*  CALCIUM 8.9 9.0 8.3*  --  8.5* 8.2*  MG  --   --  2.4  --   --   --   PHOS  --   --  5.5*  --   --   --    GFR: Estimated Creatinine Clearance: 36.9 mL/min (A) (by C-G formula based on SCr of 1.24 mg/dL  (H)). Recent Labs  Lab 09/25/18 0340 09/26/18 0440 09/27/18 0430 09/28/18 0235 09/29/18 0310 09/30/18 0500  PROCALCITON 0.56 1.68  --   --   --   --   WBC  --  18.7* 18.1* 12.1* 11.0* 12.1*    Liver Function Tests: No results for input(s): AST, ALT, ALKPHOS, BILITOT, PROT, ALBUMIN in the last 168 hours. No results for input(s): LIPASE, AMYLASE in the last 168 hours. No results for input(s): AMMONIA in the last 168 hours.  ABG    Component Value Date/Time   PHART 7.467 (H) 09/28/2018 0435   PCO2ART 47.0 09/28/2018 0435   PO2ART 69.0 (L) 09/28/2018 0435   HCO3 33.9 (H) 09/28/2018 0435   TCO2 35 (H) 09/28/2018 0435   ACIDBASEDEF 2.0 09/11/2018 0401   O2SAT 94.0 09/28/2018 0435     Coagulation Profile: Recent Labs  Lab 09/26/18 1200  INR 1.6*    Cardiac Enzymes: No results for input(s): CKTOTAL, CKMB, CKMBINDEX, TROPONINI in the last 168 hours.  HbA1C: Hgb A1c MFr Bld  Date/Time Value Ref Range Status  04/19/2012 03:43 PM 7.3 (H) <5.7 % Final    Comment:    (NOTE)                                                                       According to the ADA Clinical Practice Recommendations for 2011, when HbA1c is used as a screening test:  >=6.5%   Diagnostic of Diabetes Mellitus           (if abnormal result is confirmed) 5.7-6.4%   Increased risk of developing Diabetes Mellitus References:Diagnosis and Classification of Diabetes Mellitus,Diabetes KDTO,6712,45(YKDXI 1):S62-S69 and Standards of Medical Care in         Diabetes - 2011,Diabetes PJAS,5053,97 (Suppl 1):S11-S61.  04/18/2012 09:20 AM 7.3 (H) <5.7 % Final    Comment:    (NOTE)  According to the ADA Clinical Practice Recommendations for 2011, when HbA1c is used as a screening test:  >=6.5%   Diagnostic of Diabetes Mellitus           (if abnormal result is confirmed) 5.7-6.4%   Increased risk of developing Diabetes Mellitus  References:Diagnosis and Classification of Diabetes Mellitus,Diabetes KRCV,8184,03(FVOHK 1):S62-S69 and Standards of Medical Care in         Diabetes - 2011,Diabetes GOVP,0340,35 (Suppl 1):S11-S61.    CBG: Recent Labs  Lab 09/29/18 1107 09/29/18 1540 09/29/18 1939 09/29/18 2328 09/30/18 0320  GLUCAP 247* 267* 232* 214* 222*   The patient is critically ill with multiple organ systems failure and requires high complexity decision making for assessment and support, frequent evaluation and titration of therapies, application of advanced monitoring technologies and extensive interpretation of multiple databases.   Critical Care Time devoted to patient care services described in this note is  43  Minutes. This time reflects time of care of this signee Dr Jennet Maduro. This critical care time does not reflect procedure time, or teaching time or supervisory time of PA/NP/Med student/Med Resident etc but could involve care discussion time.  Rush Farmer, M.D. [facility] Pulmonary/Critical Care Medicine. Pager: [phone]. After hours pager: [phone].

## 2018-09-30 NOTE — Progress Notes (Signed)
Pt moved around all night long. She will kick her legs to attempt to communicate and mouth words.

## 2018-09-30 NOTE — Progress Notes (Signed)
PROGRESS NOTE  Sabrina Baker UMP:[id number] DOB: 1941-01-01 DOA: 09/09/2018  PCP: [name], MD  Brief History/Interval Summary: Patient is a 78 y.o. female with PMHx of HTN, DM, CKD stage III, presented with shortness of breath and cough, found to have acute hypoxic respiratory failure due to ARDS in the setting of COVID-19 infection.  Consultants: Pulmonology  Procedures:   3/30>> ETT 3/21>> RUE PICC 4/10>> A line-out 4/15>>extubated 4/15>>ETT (re-intubated)  Significant hospital events: 3/30Admitted, intubated 3/31>>4/5 Plaquenil 4/13 transfer to [facility] 4/15 Extubated 4/15 Stridor-cord edema-reintubated   Antibiotics: Anti-infectives (From admission, onward)   Start     Dose/Rate Route Frequency Ordered Stop   09/27/18 1300  cefTRIAXone (ROCEPHIN) 2 g in sodium chloride 0.9 % 100 mL IVPB     2 g 200 mL/hr over 30 Minutes Intravenous Every 24 hours 09/27/18 0928     09/26/18 1400  ampicillin (OMNIPEN) 1 g in sodium chloride 0.9 % 100 mL IVPB     1 g 300 mL/hr over 20 Minutes Intravenous Every 8 hours 09/26/18 1204     09/26/18 1300  cefTRIAXone (ROCEPHIN) 1 g in sodium chloride 0.9 % 100 mL IVPB  Status:  Discontinued     1 g 200 mL/hr over 30 Minutes Intravenous Every 24 hours 09/26/18 1204 09/27/18 0928   09/11/18 2200  hydroxychloroquine (PLAQUENIL) tablet 200 mg  Status:  Discontinued     200 mg Oral 2 times daily 09/10/18 2309 09/10/18 2319   09/11/18 2200  hydroxychloroquine (PLAQUENIL) tablet 200 mg     200 mg Per Tube 2 times daily 09/10/18 2319 09/15/18 0851   09/10/18 2330  hydroxychloroquine (PLAQUENIL) tablet 400 mg     400 mg Per Tube 2 times daily 09/10/18 2319 09/11/18 0823   09/10/18 2315  hydroxychloroquine (PLAQUENIL) tablet 400 mg  Status:  Discontinued     400 mg Oral 2 times daily 09/10/18 2309 09/10/18 2319   09/10/18 1300  azithromycin (ZITHROMAX) 500 mg in sodium chloride 0.9 % 250 mL IVPB     500 mg 250 mL/hr over 60 Minutes Intravenous  Every 24 hours 09/09/18 1342 09/15/18 1531   09/10/18 1200  cefTRIAXone (ROCEPHIN) 1 g in sodium chloride 0.9 % 100 mL IVPB  Status:  Discontinued     1 g 200 mL/hr over 30 Minutes Intravenous Every 24 hours 09/09/18 1342 09/14/18 1436   09/09/18 1200  cefTRIAXone (ROCEPHIN) 1 g in sodium chloride 0.9 % 100 mL IVPB     1 g 200 mL/hr over 30 Minutes Intravenous  Once 09/09/18 1148 09/09/18 1248   09/09/18 1200  azithromycin (ZITHROMAX) 500 mg in sodium chloride 0.9 % 250 mL IVPB     500 mg 250 mL/hr over 60 Minutes Intravenous  Once 09/09/18 1148 09/09/18 1351      Subjective/Interval History: Patient intubated.  Noted to be awake.  Slightly agitated.  Follow certain commands.  Eyes open.   Assessment/Plan:  Acute Hypoxic Resp. Failure due to Acute Covid 19 Viral Illness during the ongoing 2020 Covid 19 Pandemic She was extubated on 4/15-but then developed stridor 2/2 cord edema and was reintubated the same day.   Plan was for tracheostomy in the near future.  However she was retested and was again noted to be positive for SARS COV2.  This morning patient was found to have cuff leak.  Suggesting perhaps the edema has improved.  Noted to be on dexamethasone for the cord edema.   Pulmonology is following and managing.  Currently  on pressure support.  Plan is to attempt extubation today.   Vent Mode: PSV;CPAP FiO2 (%):  [40 %] 40 % Set Rate:  [14 bmp] 14 bmp Vt Set:  [420 mL] 420 mL PEEP:  [5 cmH20] 5 cmH20 Pressure Support:  [5 cmH20-10 cmH20] 5 cmH20 Plateau Pressure:  [9 cmH20-14 cmH20] 14 cmH20     Component Value Date/Time   PHART 7.467 (H) 09/28/2018 0435   PCO2ART 47.0 09/28/2018 0435   PO2ART 69.0 (L) 09/28/2018 0435   HCO3 33.9 (H) 09/28/2018 0435   TCO2 35 (H) 09/28/2018 0435   ACIDBASEDEF 2.0 09/11/2018 0401   O2SAT 94.0 09/28/2018 0435    The treatment plan and use of medications and known side effects were discussed with patient/family, they were clearly explained  that there is no proven definitive treatment for COVID-19 infection, any medications used here are based on published clinical articles/anecdotal data which are not peer-reviewed or randomized control trials.  Complete risks and long-term side effects are unknown, however in the best clinical judgment they seem to be of some clinical benefit rather than medical risks.  Patient/family agree with the treatment plan and want to receive the given medications.  Patient was given hydroxychloroquine.   Fever/UTI Patient was diagnosed as having a UTI.  Her urine culture report showed enterococcus and Citrobacter.  She was started on ampicillin and ceftriaxone on 4/16.   Her last fever was on 4/18.  Continue to monitor.  WBC is minimally elevated which could be due to steroids.  According to previous notes it appears that patient was also briefly hypothermic, this appears to have resolved.  Abnormal thyroid function tests TSH is 0.551 with free T4 of 4.3 and free T3 of 0.8.  Patient without any known history of thyroid disease.  Free T4 and free T3 are below the normal range.  Continue low-dose levothyroxine.  Agitation/delirium Currently on Seroquel.  Patient noted to be elevated this morning.  May need to add back Klonopin. But Klonopin was noted to make her very drowsy and sleepy.  AKI on CKD stage III AKI likely hemodynamically mediated-creatinine has improved-and now close to usual baseline.   Monitor urine output.  Potassium is normal today.  Accelerated hypertension Had episodes of hypotension previously but now noted to be hypertensive. She was on Levophed previously but has been titrated off.    Apparently hydralazine was not effective.  One-time dose of clonidine was ordered.  Blood pressure appears to have improved.  Continue to monitor.  Paroxysmal atrial fibrillation Maintaining sinus rhythm-continue Eliquis.  Macrocytic anemia Anemia could be secondary to critical illness.  No  evidence for overt bleeding.  Hemoglobin is stable.  TSH as discussed above.  Check vitamin Y05 and folic acid levels as well.    DM-2 Did have hypoglycemic episodes a few days back, but now CBG's on the higher side as on IV decadron for cord edema/hypothermia.    Continue current dose of Lantus for now.  Continue SSI.  HbA1c 7.3.  Depression Resume Cymbalta when extubated and able to take orally.   FEN Not on IV fluids.  Electrolytes being monitored closely.  On tube feeding.   DVT Prophylaxis: On Eliquis  Lab Results  Component Value Date   PLT 557 (H) 09/30/2018     PUD Prophylaxis: On Protonix  Code Status: Full code Family Communication: PCCM to discuss with family Disposition Plan: Remain in ICU.   Medications:  Scheduled: . apixaban  5 mg Per Tube BID  .  chlorhexidine gluconate (MEDLINE KIT)  15 mL Mouth Rinse BID  . Chlorhexidine Gluconate Cloth  6 each Topical Daily  . dexamethasone  4 mg Intravenous Q8H  . feeding supplement (PRO-STAT SUGAR FREE 64)  30 mL Per Tube TID  . free water  300 mL Per Tube Q8H  . gabapentin  100 mg Per Tube QHS  . insulin aspart  0-15 Units Subcutaneous Q4H  . insulin aspart  4 Units Subcutaneous Q4H  . insulin glargine  35 Units Subcutaneous Q24H  . levothyroxine  12.5 mcg Intravenous Daily  . mouth rinse  15 mL Mouth Rinse 10 times per day  . pantoprazole sodium  40 mg Per Tube Daily  . polyethylene glycol  17 g Per Tube Daily  . QUEtiapine  50 mg Oral BID  . sodium chloride flush  10-40 mL Intracatheter Q12H   Continuous: . sodium chloride 10 mL/hr at 09/29/18 0621  . sodium chloride 10 mL/hr at 09/30/18 1100  . ampicillin (OMNIPEN) IV Stopped (09/30/18 0615)  . cefTRIAXone (ROCEPHIN)  IV Stopped (09/29/18 1251)  . dexmedetomidine (PRECEDEX) IV infusion 1.198 mcg/kg/hr (09/30/18 1100)  . feeding supplement (OSMOLITE 1.2 CAL) Stopped (09/30/18 1030)  . norepinephrine (LEVOPHED) Adult infusion 8 mcg/min (09/25/18 1905)    IDH:WYSHUO chloride, acetaminophen (TYLENOL) oral liquid 160 mg/5 mL, albuterol, fentaNYL (SUBLIMAZE) injection, fentaNYL (SUBLIMAZE) injection, hydrALAZINE, sodium chloride flush   Objective:  Vital Signs  Vitals:   09/30/18 0800 09/30/18 0900 09/30/18 0905 09/30/18 1000  BP: (!) 173/83 (!) 180/97 (!) 180/97 123/65  Pulse: 65 68 72 76  Resp: (!) 9 (!) 37 (!) 25 19  Temp: 98.2 F (36.8 C)     TempSrc: Axillary     SpO2: 98% 91% 95% 93%  Weight:      Height:        Intake/Output Summary (Last 24 hours) at 09/30/2018 1101 Last data filed at 09/30/2018 1000 Gross per 24 hour  Intake 2388.89 ml  Output 2415 ml  Net -26.11 ml   Filed Weights   09/21/18 0444 09/22/18 0500 09/23/18 0516  Weight: 77.1 kg 76.8 kg 75.1 kg    General appearance: Noted to be mildly agitated.  Intubated. Head: Normocephalic, without obvious abnormality, atraumatic Resp: Coarse breath sounds bilaterally.  Crackles at the bases bilaterally.  No wheezing or rhonchi. Cardio: regular rate and rhythm, S1, S2 normal, no murmur, click, rub or gallop GI: soft, non-tender; bowel sounds normal; no masses,  no organomegaly Extremities: extremities normal, atraumatic, no cyanosis or edema Pulses: 2+ and symmetric Neurologic: Follows commands.  Moving all her extremities.  Lab Results:  Data Reviewed: I have personally reviewed following labs and imaging studies  CBC: Recent Labs  Lab 09/26/18 0440 09/27/18 0430 09/28/18 0235 09/28/18 0435 09/29/18 0310 09/30/18 0500  WBC 18.7* 18.1* 12.1*  --  11.0* 12.1*  HGB 7.9* 7.9* 8.2* 8.5* 8.3* 8.9*  HCT 26.1* 25.5* 26.9* 25.0* 28.0* 29.4*  MCV 101.6* 100.0 101.1*  --  101.1* 101.0*  PLT 460* 460* 528*  --  526* 557*    Basic Metabolic Panel: Recent Labs  Lab 09/26/18 0440 09/27/18 0430 09/28/18 0235 09/28/18 0435 09/29/18 0310 09/30/18 0500  NA 142 143 143 140 144 145  K 4.9 3.6 5.2* 5.3* 4.6 4.2  CL 98 99 101  --  103 107  CO2 34* 33* 32  --   31 32  GLUCOSE 279* 191* 272*  --  214* 203*  BUN 93* 97* 112*  --  101* 85*  CREATININE 1.90* 1.60* 1.72*  --  1.59* 1.24*  CALCIUM 8.9 9.0 8.3*  --  8.5* 8.2*  MG  --   --  2.4  --   --   --   PHOS  --   --  5.5*  --   --   --     GFR: Estimated Creatinine Clearance: 36.9 mL/min (A) (by C-G formula based on SCr of 1.24 mg/dL (H)).  Coagulation Profile: Recent Labs  Lab 09/26/18 1200  INR 1.6*    CBG: Recent Labs  Lab 09/29/18 1540 09/29/18 1939 09/29/18 2328 09/30/18 0320 09/30/18 0739  GLUCAP 267* 232* 214* 222* 198*    Thyroid Function Tests: Recent Labs    09/27/18 1150  TSH 0.551  T4TOTAL 4.3*  T3FREE 0.8*     Recent Results (from the past 240 hour(s))  Culture, Urine     Status: Abnormal   Collection Time: 09/22/18  4:00 PM  Result Value Ref Range Status   Specimen Description URINE, CATHETERIZED  Final   Special Requests   Final    Normal Performed at [facility], [place] [address], [place], [place] [address]    Culture (A)  Final    >=100,000 COLONIES/mL ENTEROCOCCUS FAECALIS 10,000 COLONIES/mL CITROBACTER FREUNDII    Report Status 09/24/2018 FINAL  Final   Organism ID, Bacteria ENTEROCOCCUS FAECALIS (A)  Final   Organism ID, Bacteria CITROBACTER FREUNDII (A)  Final      Susceptibility   Citrobacter freundii - MIC*    CEFAZOLIN >=64 RESISTANT Resistant     CEFTRIAXONE <=1 SENSITIVE Sensitive     CIPROFLOXACIN <=0.25 SENSITIVE Sensitive     GENTAMICIN <=1 SENSITIVE Sensitive     IMIPENEM <=0.25 SENSITIVE Sensitive     NITROFURANTOIN <=16 SENSITIVE Sensitive     TRIMETH/SULFA <=20 SENSITIVE Sensitive     PIP/TAZO <=4 SENSITIVE Sensitive     * 10,000 COLONIES/mL CITROBACTER FREUNDII   Enterococcus faecalis - MIC*    AMPICILLIN <=2 SENSITIVE Sensitive     LEVOFLOXACIN 2 SENSITIVE Sensitive     NITROFURANTOIN <=16 SENSITIVE Sensitive     VANCOMYCIN 2 SENSITIVE Sensitive     * >=100,000 COLONIES/mL ENTEROCOCCUS FAECALIS  Culture,  respiratory (non-expectorated)     Status: None   Collection Time: 09/22/18  4:00 PM  Result Value Ref Range Status   Specimen Description TRACHEAL ASPIRATE  Final   Special Requests Normal  Final   Gram Stain   Final    MODERATE WBC PRESENT,BOTH PMN AND MONONUCLEAR MODERATE GRAM POSITIVE COCCI IN PAIRS IN CLUSTERS MODERATE GRAM NEGATIVE RODS Performed at [facility], [address], [place], [place] [address]    Culture FEW KLEBSIELLA PNEUMONIAE  Final   Report Status 09/25/2018 FINAL  Final   Organism ID, Bacteria KLEBSIELLA PNEUMONIAE  Final      Susceptibility   Klebsiella pneumoniae - MIC*    AMPICILLIN RESISTANT Resistant     CEFAZOLIN <=4 SENSITIVE Sensitive     CEFEPIME <=1 SENSITIVE Sensitive     CEFTAZIDIME <=1 SENSITIVE Sensitive     CEFTRIAXONE <=1 SENSITIVE Sensitive     CIPROFLOXACIN <=0.25 SENSITIVE Sensitive     GENTAMICIN <=1 SENSITIVE Sensitive     IMIPENEM <=0.25 SENSITIVE Sensitive     TRIMETH/SULFA <=20 SENSITIVE Sensitive     AMPICILLIN/SULBACTAM 4 SENSITIVE Sensitive     PIP/TAZO <=4 SENSITIVE Sensitive     Extended ESBL NEGATIVE Sensitive     * FEW KLEBSIELLA  PNEUMONIAE  Culture, blood (routine x 2)     Status: None   Collection Time: 09/22/18  4:13 PM  Result Value Ref Range Status   Specimen Description BLOOD LEFT ANTECUBITAL  Final   Special Requests   Final    BOTTLES DRAWN AEROBIC ONLY Blood Culture adequate volume   Culture   Final    NO GROWTH 5 DAYS Performed at [facility], [address], [place], [place] [address]    Report Status 09/27/2018 FINAL  Final  Culture, blood (routine x 2)     Status: None   Collection Time: 09/22/18  6:25 PM  Result Value Ref Range Status   Specimen Description BLOOD LEFT HAND  Final   Special Requests   Final    BOTTLES DRAWN AEROBIC AND ANAEROBIC Blood Culture adequate volume   Culture   Final    NO GROWTH 5 DAYS Performed at [facility], [place] [address] [place], [place] [address]     Report Status 09/27/2018 FINAL  Final  Culture, blood (routine x 2)     Status: None (Preliminary result)   Collection Time: 09/25/18  8:05 AM  Result Value Ref Range Status   Specimen Description   Final    BLOOD LEFT ANTECUBITAL Performed at [place] [address] [place], [place] [address]    Special Requests   Final    BOTTLES DRAWN AEROBIC AND ANAEROBIC Blood Culture adequate volume Performed at [place] [address] [place], [place] [address]    Culture   Final    NO GROWTH 4 DAYS Performed at [facility], [place] [address] [place], [place] [address]    Report Status PENDING  Incomplete  Culture, blood (routine x 2)     Status: None (Preliminary result)   Collection Time: 09/25/18  8:17 AM  Result Value Ref Range Status   Specimen Description   Final    BLOOD LEFT ANTECUBITAL Performed at [place] [address] [place], [place] [address]    Special Requests   Final    BOTTLES DRAWN AEROBIC AND ANAEROBIC Blood Culture adequate volume Performed at [place] [address] [place], [place] [address]    Culture   Final    NO GROWTH 4 DAYS Performed at [facility], [place] [address] [place], [place] [address]    Report Status PENDING  Incomplete  Culture, Urine     Status: Abnormal   Collection Time: 09/25/18  9:03 AM  Result Value Ref Range Status   Specimen Description   Final    URINE, RANDOM Performed at [place] [address] [place], [place] [address]    Special Requests   Final    NONE Performed at [facility], [place] [address], [place], [place] [address]    Culture (A)  Final    >=100,000 COLONIES/mL CITROBACTER FREUNDII >=100,000 COLONIES/mL ENTEROCOCCUS FAECALIS    Report Status 09/27/2018 FINAL  Final   Organism ID, Bacteria CITROBACTER FREUNDII (A)  Final   Organism ID, Bacteria ENTEROCOCCUS  FAECALIS (A)  Final      Susceptibility   Citrobacter freundii - MIC*    CEFAZOLIN >=64 RESISTANT Resistant     CEFTRIAXONE <=1 SENSITIVE Sensitive     CIPROFLOXACIN <=0.25 SENSITIVE Sensitive     GENTAMICIN <=1 SENSITIVE Sensitive     IMIPENEM 0.5 SENSITIVE Sensitive     NITROFURANTOIN  32 SENSITIVE Sensitive     TRIMETH/SULFA <=20 SENSITIVE Sensitive     PIP/TAZO <=4 SENSITIVE Sensitive     * >=100,000 COLONIES/mL CITROBACTER FREUNDII   Enterococcus faecalis - MIC*    AMPICILLIN <=2 SENSITIVE Sensitive     LEVOFLOXACIN 2 SENSITIVE Sensitive     NITROFURANTOIN <=16 SENSITIVE Sensitive     VANCOMYCIN 2 SENSITIVE Sensitive     * >=100,000 COLONIES/mL ENTEROCOCCUS FAECALIS  Culture, respiratory (non-expectorated)     Status: None (Preliminary result)   Collection Time: 09/28/18 11:06 AM  Result Value Ref Range Status   Specimen Description   Final    TRACHEAL ASPIRATE Performed at [place] [address], [place], [place] [address]    Special Requests   Final    Normal Performed at [facility], [place] [address], [place], [place] [address]    Gram Stain   Final    RARE WBC PRESENT, PREDOMINANTLY PMN RARE GRAM POSITIVE COCCI RARE GRAM POSITIVE RODS    Culture   Final    CULTURE REINCUBATED FOR BETTER GROWTH Performed at [facility], [place] [address] [place], [place] [address]    Report Status PENDING  Incomplete  SARS Coronavirus 2 [facility] order, Performed in [place] hospital lab)     Status: Abnormal   Collection Time: 09/28/18 11:18 AM  Result Value Ref Range Status   SARS Coronavirus 2 POSITIVE (A) NEGATIVE Corrected    Comment: RESULT CALLED TO, READ BACK BY AND VERIFIED WITH: TETREAULT, H RN '@[email or web address]'  ON 09/28/2018 JACKSON,K Performed at [facility], [place] [address] [place], [place] [address] CORRECTED ON 04/18 AT 1954: PREVIOUSLY REPORTED AS POSITIVE CRITICAL RESULT CALLED TO, READ BACK BY AND VERIFIED  WITH: Quentin Angst RN '@[email or web address]'  ON 09/28/2018 [place]       Radiology Studies: Dg Chest Port 1 View  Result Date: 09/29/2018 CLINICAL DATA:  Acute hypoxia due to COVID 19 Fever 101.7 yesterday EXAM: PORTABLE CHEST 1 VIEW COMPARISON:  09/26/2018 FINDINGS: Endotracheal tube, NG tube, PICC line unchanged. Stable cardiac silhouette. Low lung volumes. Some improvement in pulmonary parenchymal opacities. IMPRESSION: 1. Mild improvement pulmonary parenchymal opacities. 2. Persistent low lung volumes. 3. Stable support apparatus. Electronically Signed   By: Suzy Bouchard M.D.   On: 09/29/2018 06:30       LOS: 21 days   [employer] on [email or web address]  09/30/2018, 11:01 AM

## 2018-10-01 LAB — GLUCOSE, CAPILLARY
Glucose-Capillary: 105 mg/dL — ABNORMAL HIGH (ref 70–99)
Glucose-Capillary: 131 mg/dL — ABNORMAL HIGH (ref 70–99)
Glucose-Capillary: 133 mg/dL — ABNORMAL HIGH (ref 70–99)
Glucose-Capillary: 159 mg/dL — ABNORMAL HIGH (ref 70–99)
Glucose-Capillary: 85 mg/dL (ref 70–99)
Glucose-Capillary: 97 mg/dL (ref 70–99)

## 2018-10-01 LAB — BASIC METABOLIC PANEL
Anion gap: 8 (ref 5–15)
BUN: 71 mg/dL — ABNORMAL HIGH (ref 8–23)
CO2: 30 mmol/L (ref 22–32)
Calcium: 8.1 mg/dL — ABNORMAL LOW (ref 8.9–10.3)
Chloride: 108 mmol/L (ref 98–111)
Creatinine, Ser: 1.14 mg/dL — ABNORMAL HIGH (ref 0.44–1.00)
GFR calc Af Amer: 54 mL/min — ABNORMAL LOW (ref >60)
GFR calc non Af Amer: 46 mL/min — ABNORMAL LOW (ref >60)
Glucose, Bld: 84 mg/dL (ref 70–99)
Potassium: 4.2 mmol/L (ref 3.5–5.1)
Sodium: 146 mmol/L — ABNORMAL HIGH (ref 135–145)

## 2018-10-01 LAB — CBC
HCT: 30.7 % — ABNORMAL LOW (ref 36.0–46.0)
Hemoglobin: 9.3 g/dL — ABNORMAL LOW (ref 12.0–15.0)
MCH: 30.6 pg (ref 26.0–34.0)
MCHC: 30.3 g/dL (ref 30.0–36.0)
MCV: 101 fL — ABNORMAL HIGH (ref 80.0–100.0)
Platelets: 605 10*3/uL — ABNORMAL HIGH (ref 150–400)
RBC: 3.04 MIL/uL — ABNORMAL LOW (ref 3.87–5.11)
RDW: 14.3 % (ref 11.5–15.5)
WBC: 16 10*3/uL — ABNORMAL HIGH (ref 4.0–10.5)
nRBC: 0 % (ref 0.0–0.2)

## 2018-10-01 LAB — FOLATE: Folate: 19.4 ng/mL (ref >5.9)

## 2018-10-01 LAB — IRON AND TIBC
Iron: 39 ug/dL (ref 28–170)
Saturation Ratios: 22 % (ref 10.4–31.8)
TIBC: 175 ug/dL — ABNORMAL LOW (ref 250–450)
UIBC: 136 ug/dL

## 2018-10-01 LAB — CULTURE, RESPIRATORY: Special Requests: NORMAL

## 2018-10-01 LAB — CULTURE, RESPIRATORY W GRAM STAIN: Culture: NORMAL

## 2018-10-01 LAB — RETICULOCYTES
Immature Retic Fract: 27.6 % — ABNORMAL HIGH (ref 2.3–15.9)
RBC.: 3.04 MIL/uL — ABNORMAL LOW (ref 3.87–5.11)
Retic Count, Absolute: 61.1 10*3/uL (ref 19.0–186.0)
Retic Ct Pct: 2 % (ref 0.4–3.1)

## 2018-10-01 LAB — FERRITIN: Ferritin: 467 ng/mL — ABNORMAL HIGH (ref 11–307)

## 2018-10-01 LAB — VITAMIN B12: Vitamin B-12: 2058 pg/mL — ABNORMAL HIGH (ref 180–914)

## 2018-10-01 MED ORDER — LEVOTHYROXINE SODIUM 25 MCG PO TABS
25.0000 ug | ORAL_TABLET | Freq: Every day | ORAL | Status: DC
  Administered 2018-10-02 – 2018-10-03 (×2): 25 ug via ORAL
  Filled 2018-10-01 (×3): qty 1

## 2018-10-01 MED ORDER — GABAPENTIN 100 MG PO CAPS
100.0000 mg | ORAL_CAPSULE | Freq: Every day | ORAL | Status: DC
  Administered 2018-10-01 – 2018-10-10 (×10): 100 mg via ORAL
  Filled 2018-10-01 (×11): qty 1

## 2018-10-01 MED ORDER — ACETAMINOPHEN 160 MG/5ML PO SOLN
650.0000 mg | ORAL | Status: DC | PRN
  Administered 2018-10-03 – 2018-10-04 (×3): 650 mg via ORAL
  Filled 2018-10-01 (×4): qty 20.3

## 2018-10-01 MED ORDER — HALOPERIDOL LACTATE 5 MG/ML IJ SOLN: 1.0000 mg | Freq: Four times a day (QID) | INTRAMUSCULAR | Status: DC | PRN

## 2018-10-01 MED ORDER — SODIUM CHLORIDE 0.9 % IV BOLUS
250.0000 mL | Freq: Once | INTRAVENOUS | Status: AC
  Administered 2018-10-01: 250 mL via INTRAVENOUS

## 2018-10-01 MED ORDER — CHLORHEXIDINE GLUCONATE 0.12 % MT SOLN
15.0000 mL | Freq: Two times a day (BID) | OROMUCOSAL | Status: DC
  Administered 2018-10-01 – 2018-10-11 (×19): 15 mL via OROMUCOSAL
  Filled 2018-10-01 (×16): qty 15

## 2018-10-01 MED ORDER — APIXABAN 5 MG PO TABS
5.0000 mg | ORAL_TABLET | Freq: Two times a day (BID) | ORAL | Status: DC
  Administered 2018-10-01 – 2018-10-11 (×21): 5 mg via ORAL
  Filled 2018-10-01 (×24): qty 1

## 2018-10-01 MED ORDER — AMLODIPINE BESYLATE 5 MG PO TABS
5.0000 mg | ORAL_TABLET | Freq: Every day | ORAL | Status: DC
  Administered 2018-10-01: 5 mg via ORAL
  Filled 2018-10-01: qty 1

## 2018-10-01 MED ORDER — PANTOPRAZOLE SODIUM 40 MG PO PACK
40.0000 mg | PACK | Freq: Every day | ORAL | Status: DC
  Administered 2018-10-01: 20:00:00 40 mg via ORAL
  Filled 2018-10-01 (×3): qty 20

## 2018-10-01 MED ORDER — PRO-STAT SUGAR FREE PO LIQD
30.0000 mL | Freq: Three times a day (TID) | ORAL | Status: DC
  Administered 2018-10-01: 30 mL via ORAL
  Filled 2018-10-01: qty 30

## 2018-10-01 MED ORDER — CARVEDILOL 3.125 MG PO TABS
3.1250 mg | ORAL_TABLET | Freq: Two times a day (BID) | ORAL | Status: DC
  Administered 2018-10-01 – 2018-10-02 (×2): 3.125 mg via ORAL
  Filled 2018-10-01 (×3): qty 1

## 2018-10-01 MED ORDER — POLYETHYLENE GLYCOL 3350 17 G PO PACK
17.0000 g | PACK | Freq: Every day | ORAL | Status: DC
  Administered 2018-10-03 – 2018-10-11 (×5): 17 g via ORAL
  Filled 2018-10-01 (×7): qty 1

## 2018-10-01 MED ORDER — RESOURCE THICKENUP CLEAR PO POWD
ORAL | Status: DC | PRN
  Filled 2018-10-01 (×2): qty 125

## 2018-10-01 NOTE — Evaluation (Signed)
Occupational Therapy Evaluation Patient Details Name: Sabrina Baker MRN: [id number] DOB: 1940/11/04 Today's Date: 10/01/2018    History of Present Illness 78 y.o. female with PMHx of HTN, DM, CKD stage III, presented with shortness of breath and cough, found to have acute hypoxic respiratory failure due to ARDS in the setting of COVID-19 infection. ETT 3/30-4/15, stridor and vocal fold edema; reintubated 4/15-4/20.     Clinical Impression   PTA, pt reports she was living alone and used a RW and performed ADLs; pt poor historian due to confusion and decreased awareness. Pt currently requiring Mod A for grooming at bed level and Max A for dressing, bathing, and toileting. Pt demonstrating decreased cognition, balance, strength, and activity tolerance. SpO2 95% on 2L O2, HR elevated to 121 and RR 22 with activity, and BP 126/110. Pt perseverating on topic of washing her hair, drinking Dr. Malachi Bonds, and eating. Pt would benefit from further acute OT to facilitate safe dc. Recommend dc to SNF for further OT to optimize safety, independence with ADLs, and return to PLOF.      Follow Up Recommendations  SNF;Supervision/Assistance - 24 hour    Equipment Recommendations  Other (comment)(Defer to next venue)    Recommendations for Other Services PT consult;Speech consult     Precautions / Restrictions Precautions Precautions: Fall      Mobility Bed Mobility Overal bed mobility: Needs Assistance Bed Mobility: Supine to Sit;Sit to Supine     Supine to sit: Max assist;+2 for physical assistance;+2 for safety/equipment Sit to supine: Total assist;+2 for physical assistance   General bed mobility comments: Max A +2 for elevating trunk, bringing BLEs over EOB, and facilitate hips towards EOB with bed pad. Requiring Total A +2 for reutnring to bed  Transfers                 General transfer comment: Defered for safety    Balance Overall balance assessment: Needs  assistance Sitting-balance support: No upper extremity supported;Feet supported Sitting balance-Leahy Scale: Poor Sitting balance - Comments: Requiring physical A and fatigues quickly                                   ADL either performed or assessed with clinical judgement   ADL Overall ADL's : Needs assistance/impaired Eating/Feeding: NPO   Grooming: Bed level;Oral care;Wash/dry face;Moderate assistance Grooming Details (indicate cue type and reason): Pt washing her face and performing oral care in supine with Min A for BUE coorindation and support Upper Body Bathing: Maximal assistance;Bed level;Sitting   Lower Body Bathing: Maximal assistance;Bed level   Upper Body Dressing : Maximal assistance;Bed level   Lower Body Dressing: Maximal assistance;Bed level                 General ADL Comments: Pt presenting with decreased cognition, balance, strength, and activity tolerance.      Vision Baseline Vision/History: Wears glasses Wears Glasses: Reading only       Perception     Praxis      Pertinent Vitals/Pain Pain Assessment: Faces Faces Pain Scale: Hurts a little bit Pain Location: Generalized Pain Descriptors / Indicators: Constant;Discomfort;Grimacing Pain Intervention(s): Monitored during session;Limited activity within patient's tolerance;Repositioned     Hand Dominance Right   Extremity/Trunk Assessment Upper Extremity Assessment Upper Extremity Assessment: Generalized weakness   Lower Extremity Assessment Lower Extremity Assessment: Defer to PT evaluation   Cervical / Trunk Assessment Cervical / Trunk  Assessment: Other exceptions Cervical / Trunk Exceptions: Poor trunk control and fatigues quickly in upright position   Communication Communication Communication: No difficulties(Slow speech)   Cognition Arousal/Alertness: Awake/alert Behavior During Therapy: Restless Overall Cognitive Status: No family/caregiver present to  determine baseline cognitive functioning Area of Impairment: Following commands;Attention;Memory;Safety/judgement;Awareness;Problem solving                   Current Attention Level: Focused Memory: Decreased short-term memory Following Commands: Follows one step commands inconsistently;Follows one step commands with increased time Safety/Judgement: Decreased awareness of safety;Decreased awareness of deficits Awareness: Intellectual Problem Solving: Slow processing;Difficulty sequencing;Requires verbal cues General Comments: Pt perseverating on topics of washing her hair (which recently was washed), eating, and drinking Dr. Malachi Bonds or water.   General Comments  SpO2 95% on 2L O2. HR elevating to 121 during activity. RR 22. BP 126/110    Exercises     Shoulder Instructions      Home Living Family/patient expects to be discharged to:: Private residence Living Arrangements: Alone Available Help at Discharge: Friend(s);Available PRN/intermittently;Family(Daughter and son. Friends from church; "the church people") Type of Home: House Home Access: Stairs to enter Technical brewer of Steps: 5-6 Entrance Stairs-Rails: None       Bathroom Shower/Tub: Teacher, early years/pre: Standard     Home Equipment: Environmental consultant - 2 wheels;Bedside commode   Additional Comments: Unsure of reliability due to confusion      Prior Functioning/Environment Level of Independence: Independent with assistive device(s)        Comments: Uses RW. ADLs and IADLs. Unsure of reliability due to confusion        OT Problem List: Decreased strength;Decreased range of motion;Decreased activity tolerance;Impaired balance (sitting and/or standing);Decreased coordination;Decreased cognition;Decreased safety awareness;Decreased knowledge of use of DME or AE;Decreased knowledge of precautions;Impaired UE functional use;Pain      OT Treatment/Interventions: Self-care/ADL training;Therapeutic  exercise;Energy conservation;DME and/or AE instruction;Therapeutic activities;Patient/family education    OT Goals(Current goals can be found in the care plan section) Acute Rehab OT Goals Patient Stated Goal: "I want some Dr. Malachi Bonds" OT Goal Formulation: Patient unable to participate in goal setting Time For Goal Achievement: 10/15/18 Potential to Achieve Goals: Good  OT Frequency: Min 2X/week   Barriers to D/C:            Co-evaluation              AM-PAC OT "6 Clicks" Daily Activity     Outcome Measure Help from another person eating meals?: Total Help from another person taking care of personal grooming?: A Lot Help from another person toileting, which includes using toliet, bedpan, or urinal?: A Lot Help from another person bathing (including washing, rinsing, drying)?: A Lot Help from another person to put on and taking off regular upper body clothing?: A Lot Help from another person to put on and taking off regular lower body clothing?: Total 6 Click Score: 10   End of Session Equipment Utilized During Treatment: Oxygen Nurse Communication: Mobility status  Activity Tolerance: Patient limited by fatigue Patient left: in bed;with call bell/phone within reach;with bed alarm set;with nursing/sitter in room;with SCD's reapplied  OT Visit Diagnosis: Unsteadiness on feet (R26.81);Other abnormalities of gait and mobility (R26.89);Muscle weakness (generalized) (M62.81);Other symptoms and signs involving cognitive function;Pain Pain - part of body: (Generalized)                Time: 1325-1350 OT Time Calculation (min): 25 min Charges:  OT General Charges $OT Visit: 1 Visit  OT Evaluation $OT Eval Moderate Complexity: 1 Mod OT Treatments $Self Care/Home Management : 8-22 mins  Yeng Perz MSOT, OTR/L Acute Rehab Pager: [phone] Office: [place] 10/01/2018, 2:54 PM

## 2018-10-01 NOTE — Progress Notes (Signed)
PROGRESS NOTE  Sabrina Baker BWG:[id number] DOB: [birth year] DOA: 09/09/2018  PCP: [name], MD  Brief History/Interval Summary: Patient is a 78 y.o. female with PMHx of HTN, DM, CKD stage III, presented with shortness of breath and cough, found to have acute hypoxic respiratory failure due to ARDS in the setting of COVID-19 infection.  Consultants: Pulmonology  Procedures:   3/30>> ETT 3/21>> RUE PICC 4/10>> A line-out 4/15>>extubated 4/15>>ETT (re-intubated)  Significant hospital events: 3/30Admitted, intubated 3/31>>4/5 Plaquenil 4/13 transfer to [facility] 4/15 Extubated 4/15 Stridor-cord edema-reintubated   Antibiotics: Anti-infectives (From admission, onward)   Start     Dose/Rate Route Frequency Ordered Stop   09/27/18 1300  cefTRIAXone (ROCEPHIN) 2 g in sodium chloride 0.9 % 100 mL IVPB     2 g 200 mL/hr over 30 Minutes Intravenous Every 24 hours 09/27/18 0928     09/26/18 1400  ampicillin (OMNIPEN) 1 g in sodium chloride 0.9 % 100 mL IVPB     1 g 300 mL/hr over 20 Minutes Intravenous Every 8 hours 09/26/18 1204     09/26/18 1300  cefTRIAXone (ROCEPHIN) 1 g in sodium chloride 0.9 % 100 mL IVPB  Status:  Discontinued     1 g 200 mL/hr over 30 Minutes Intravenous Every 24 hours 09/26/18 1204 09/27/18 0928   09/11/18 2200  hydroxychloroquine (PLAQUENIL) tablet 200 mg  Status:  Discontinued     200 mg Oral 2 times daily 09/10/18 2309 09/10/18 2319   09/11/18 2200  hydroxychloroquine (PLAQUENIL) tablet 200 mg     200 mg Per Tube 2 times daily 09/10/18 2319 09/15/18 0851   09/10/18 2330  hydroxychloroquine (PLAQUENIL) tablet 400 mg     400 mg Per Tube 2 times daily 09/10/18 2319 09/11/18 0823   09/10/18 2315  hydroxychloroquine (PLAQUENIL) tablet 400 mg  Status:  Discontinued     400 mg Oral 2 times daily 09/10/18 2309 09/10/18 2319   09/10/18 1300  azithromycin (ZITHROMAX) 500 mg in sodium chloride 0.9 % 250 mL IVPB     500 mg 250 mL/hr over 60 Minutes Intravenous  Every 24 hours 09/09/18 1342 09/15/18 1531   09/10/18 1200  cefTRIAXone (ROCEPHIN) 1 g in sodium chloride 0.9 % 100 mL IVPB  Status:  Discontinued     1 g 200 mL/hr over 30 Minutes Intravenous Every 24 hours 09/09/18 1342 09/14/18 1436   09/09/18 1200  cefTRIAXone (ROCEPHIN) 1 g in sodium chloride 0.9 % 100 mL IVPB     1 g 200 mL/hr over 30 Minutes Intravenous  Once 09/09/18 1148 09/09/18 1248   09/09/18 1200  azithromycin (ZITHROMAX) 500 mg in sodium chloride 0.9 % 250 mL IVPB     500 mg 250 mL/hr over 60 Minutes Intravenous  Once 09/09/18 1148 09/09/18 1351      Subjective/Interval History: Patient was extubated yesterday.  Uneventful night.  Feels well this morning.  Mildly distracted.     Assessment/Plan:  Acute Hypoxic Resp. Failure due to Acute Covid 19 Viral Illness during the ongoing 2020 Covid 19 Pandemic She was extubated on 4/15-but then developed stridor 2/2 cord edema and was reintubated the same day.   Plan was for tracheostomy in the near future.  However she was retested and was again noted to be positive for SARS COV2.  Yesterday patient was found to have a cuff leak suggesting perhaps that the edema had improved.  Patient was successfully extubated.  She is stable from a respiratory standpoint this morning.  We will take  her off of the dexamethasone.  Doing well on oxygen by nasal cannula.  Okay for transfer to the floor today.  Speech therapy to see.  PT and OT evaluation.    The treatment plan and use of medications and known side effects were discussed with patient/family, they were clearly explained that there is no proven definitive treatment for COVID-19 infection, any medications used here are based on published clinical articles/anecdotal data which are not peer-reviewed or randomized control trials.  Complete risks and long-term side effects are unknown, however in the best clinical judgment they seem to be of some clinical benefit rather than medical risks.   Patient/family agree with the treatment plan and want to receive the given medications.  Patient was given hydroxychloroquine.   UTI Patient was diagnosed as having a UTI.  Her urine culture report showed enterococcus and Citrobacter.  She was started on ampicillin and ceftriaxone on 4/16.   Her last fever was on 4/18.  Patient seems to be stable.  WBC remains elevated which is likely due to steroids.  She is afebrile.  According to previous notes it appears that patient was also briefly hypothermic, this appears to have resolved.  Plan to give antibiotics for at least 7 days.  Abnormal thyroid function tests TSH is 0.551 with free T4 of 4.3 and free T3 of 0.8.  Patient without any known history of thyroid disease.  Free T4 and free T3 are below the normal range.  Continue low-dose levothyroxine.  Change to oral once she is cleared for oral intake by speech therapy.  Agitation/delirium Patient was noted to be quite sedated on Klonopin so this was discontinued.  She remains on Seroquel.  She is noted to be on a Precedex infusion which can be discontinued.  Use Haldol as needed.    AKI on CKD stage III/mild hypernatremia AKI likely hemodynamically mediated.  Patient has improved and back to baseline.  Monitor urine output.  Potassium is normal.  Mild hypernatremia is noted.  Accelerated hypertension Had episodes of hypotension previously but now noted to be hypertensive. She was on Levophed previously but has been titrated off.    Apparently hydralazine was not effective.  One-time dose of clonidine was ordered.  Blood pressure improved but still high values noted.  She was on amlodipine at home which can be resumed.  She was also on carvedilol which can also be resumed depending on her blood pressure trends.  Paroxysmal atrial fibrillation Maintaining sinus rhythm. Continue Eliquis.  Macrocytic anemia Anemia could be secondary to critical illness.  No evidence for overt bleeding.     Hemoglobin remained stable.  Anemia panel reviewed.  Ferritin 467.  Folate 19.4.  B12 2058.  Thyroid function tests as discussed above.   DM-2/Hypoglycemia Patient had episodes of hypoglycemia yesterday.  We will stop her Lantus.  Continue SSI.  Hemoglobin A1c 7.3.  Dexamethasone to be stopped as well over the course of the day today.    Depression Resume Cymbalta when extubated and able to take orally.  FEN Not on IV fluids.  Electrolytes being monitored closely.  Resume diet when cleared to do so by speech therapy   DVT Prophylaxis: On Eliquis  Lab Results  Component Value Date   PLT 605 (H) 10/01/2018     PUD Prophylaxis: On Protonix  Code Status: Full code Family Communication: We will discuss with family Disposition Plan: Okay for transfer to progressive unit   Medications:  Scheduled: . apixaban  5 mg Per  Tube BID  . chlorhexidine gluconate (MEDLINE KIT)  15 mL Mouth Rinse BID  . Chlorhexidine Gluconate Cloth  6 each Topical Daily  . dexamethasone  4 mg Intravenous Q8H  . feeding supplement (PRO-STAT SUGAR FREE 64)  30 mL Per Tube TID  . free water  300 mL Per Tube Q8H  . gabapentin  100 mg Per Tube QHS  . insulin aspart  0-15 Units Subcutaneous Q4H  . levothyroxine  12.5 mcg Intravenous Daily  . mouth rinse  15 mL Mouth Rinse 10 times per day  . pantoprazole sodium  40 mg Per Tube Daily  . polyethylene glycol  17 g Per Tube Daily  . QUEtiapine  50 mg Oral BID  . sodium chloride flush  10-40 mL Intracatheter Q12H   Continuous: . sodium chloride 10 mL/hr at 09/30/18 1953  . sodium chloride 10 mL/hr at 09/30/18 1900  . ampicillin (OMNIPEN) IV 1 g (10/01/18 0518)  . cefTRIAXone (ROCEPHIN)  IV Stopped (09/30/18 1239)  . dexmedetomidine (PRECEDEX) IV infusion 1.198 mcg/kg/hr (10/01/18 0647)  . feeding supplement (OSMOLITE 1.2 CAL) Stopped (09/30/18 1030)  . norepinephrine (LEVOPHED) Adult infusion 8 mcg/min (09/25/18 1905)   YBO:FBPZWC chloride, acetaminophen  (TYLENOL) oral liquid 160 mg/5 mL, albuterol, fentaNYL (SUBLIMAZE) injection, fentaNYL (SUBLIMAZE) injection, hydrALAZINE, sodium chloride flush   Objective:  Vital Signs  Vitals:   10/01/18 0437 10/01/18 0500 10/01/18 0600 10/01/18 0800  BP:  (!) 139/95 (!) 162/58 (!) 154/62  Pulse:  64 63 70  Resp:  15 11 (!) 21  Temp:      TempSrc:      SpO2: 97% 99% 91% 96%  Weight:      Height:        Intake/Output Summary (Last 24 hours) at 10/01/2018 1006 Last data filed at 10/01/2018 0518 Gross per 24 hour  Intake 815.06 ml  Output 1975 ml  Net -1159.94 ml   Filed Weights   09/21/18 0444 09/22/18 0500 09/23/18 0516  Weight: 77.1 kg 76.8 kg 75.1 kg   General appearance: Awake alert.  Mildly distracted. Resp: Coarse breath sounds bilaterally.  Crackles at the bases.  No wheezing or rhonchi.   Cardio: S1-S2 is normal regular.  No S3-S4.  No rubs murmurs or bruit GI: Abdomen is soft.  Nontender nondistended.  Bowel sounds are present normal.  No masses organomegaly Extremities: No edema.  Full range of motion of lower extremities. Neurologic: Awake alert.  Distracted.  No focal neurological deficits.      Lab Results:  Data Reviewed: I have personally reviewed following labs and imaging studies  CBC: Recent Labs  Lab 09/27/18 0430 09/28/18 0235 09/28/18 0435 09/29/18 0310 09/30/18 0500 10/01/18 0235  WBC 18.1* 12.1*  --  11.0* 12.1* 16.0*  HGB 7.9* 8.2* 8.5* 8.3* 8.9* 9.3*  HCT 25.5* 26.9* 25.0* 28.0* 29.4* 30.7*  MCV 100.0 101.1*  --  101.1* 101.0* 101.0*  PLT 460* 528*  --  526* 557* 605*    Basic Metabolic Panel: Recent Labs  Lab 09/27/18 0430 09/28/18 0235 09/28/18 0435 09/29/18 0310 09/30/18 0500 10/01/18 0235  NA 143 143 140 144 145 146*  K 3.6 5.2* 5.3* 4.6 4.2 4.2  CL 99 101  --  103 107 108  CO2 33* 32  --  31 32 30  GLUCOSE 191* 272*  --  214* 203* 84  BUN 97* 112*  --  101* 85* 71*  CREATININE 1.60* 1.72*  --  1.59* 1.24* 1.14*  CALCIUM 9.0 8.3*   --  8.5* 8.2* 8.1*  MG  --  2.4  --   --   --   --   PHOS  --  5.5*  --   --   --   --     GFR: Estimated Creatinine Clearance: 40.1 mL/min (A) (by C-G formula based on SCr of 1.14 mg/dL (H)).  Coagulation Profile: Recent Labs  Lab 09/26/18 1200  INR 1.6*    CBG: Recent Labs  Lab 09/30/18 2108 09/30/18 2206 09/30/18 2258 10/01/18 0304 10/01/18 0921  GLUCAP 61* 84 80 85 105*     Recent Results (from the past 240 hour(s))  Culture, Urine     Status: Abnormal   Collection Time: 09/22/18  4:00 PM  Result Value Ref Range Status   Specimen Description URINE, CATHETERIZED  Final   Special Requests   Final    Normal Performed at [facility], [place] [address], [place], [place] [address]    Culture (A)  Final    >=100,000 COLONIES/mL ENTEROCOCCUS FAECALIS 10,000 COLONIES/mL CITROBACTER FREUNDII    Report Status 09/24/2018 FINAL  Final   Organism ID, Bacteria ENTEROCOCCUS FAECALIS (A)  Final   Organism ID, Bacteria CITROBACTER FREUNDII (A)  Final      Susceptibility   Citrobacter freundii - MIC*    CEFAZOLIN >=64 RESISTANT Resistant     CEFTRIAXONE <=1 SENSITIVE Sensitive     CIPROFLOXACIN <=0.25 SENSITIVE Sensitive     GENTAMICIN <=1 SENSITIVE Sensitive     IMIPENEM <=0.25 SENSITIVE Sensitive     NITROFURANTOIN <=16 SENSITIVE Sensitive     TRIMETH/SULFA <=20 SENSITIVE Sensitive     PIP/TAZO <=4 SENSITIVE Sensitive     * 10,000 COLONIES/mL CITROBACTER FREUNDII   Enterococcus faecalis - MIC*    AMPICILLIN <=2 SENSITIVE Sensitive     LEVOFLOXACIN 2 SENSITIVE Sensitive     NITROFURANTOIN <=16 SENSITIVE Sensitive     VANCOMYCIN 2 SENSITIVE Sensitive     * >=100,000 COLONIES/mL ENTEROCOCCUS FAECALIS  Culture, respiratory (non-expectorated)     Status: None   Collection Time: 09/22/18  4:00 PM  Result Value Ref Range Status   Specimen Description TRACHEAL ASPIRATE  Final   Special Requests Normal  Final   Gram Stain   Final    MODERATE WBC PRESENT,BOTH PMN AND  MONONUCLEAR MODERATE GRAM POSITIVE COCCI IN PAIRS IN CLUSTERS MODERATE GRAM NEGATIVE RODS Performed at [facility], [address] [place] [address] [place], [place] [address]    Culture FEW KLEBSIELLA PNEUMONIAE  Final   Report Status 09/25/2018 FINAL  Final   Organism ID, Bacteria KLEBSIELLA PNEUMONIAE  Final      Susceptibility   Klebsiella pneumoniae - MIC*    AMPICILLIN RESISTANT Resistant     CEFAZOLIN <=4 SENSITIVE Sensitive     CEFEPIME <=1 SENSITIVE Sensitive     CEFTAZIDIME <=1 SENSITIVE Sensitive     CEFTRIAXONE <=1 SENSITIVE Sensitive     CIPROFLOXACIN <=0.25 SENSITIVE Sensitive     GENTAMICIN <=1 SENSITIVE Sensitive     IMIPENEM <=0.25 SENSITIVE Sensitive     TRIMETH/SULFA <=20 SENSITIVE Sensitive     AMPICILLIN/SULBACTAM 4 SENSITIVE Sensitive     PIP/TAZO <=4 SENSITIVE Sensitive     Extended ESBL NEGATIVE Sensitive     * FEW KLEBSIELLA PNEUMONIAE  Culture, blood (routine x 2)     Status: None   Collection Time: 09/22/18  4:13 PM  Result Value Ref Range Status   Specimen Description BLOOD LEFT ANTECUBITAL  Final   Special Requests  Final    BOTTLES DRAWN AEROBIC ONLY Blood Culture adequate volume   Culture   Final    NO GROWTH 5 DAYS Performed at [facility], [place] [address], [place], [place] [address]    Report Status 09/27/2018 FINAL  Final  Culture, blood (routine x 2)     Status: None   Collection Time: 09/22/18  6:25 PM  Result Value Ref Range Status   Specimen Description BLOOD LEFT HAND  Final   Special Requests   Final    BOTTLES DRAWN AEROBIC AND ANAEROBIC Blood Culture adequate volume   Culture   Final    NO GROWTH 5 DAYS Performed at [facility], [place] [address], [place], [place] [address]    Report Status 09/27/2018 FINAL  Final  Culture, blood (routine x 2)     Status: None   Collection Time: 09/25/18  8:05 AM  Result Value Ref Range Status   Specimen Description   Final    BLOOD LEFT ANTECUBITAL Performed at [place] [address] [place], [place] [address]    Special Requests   Final    BOTTLES DRAWN AEROBIC AND ANAEROBIC Blood Culture adequate volume Performed at [place] [facility] [address] [place], [place] [address]    Culture   Final    NO GROWTH 5 DAYS Performed at [facility], [place] [address], [place], [place] [address]    Report Status 09/30/2018 FINAL  Final  Culture, blood (routine x 2)     Status: None   Collection Time: 09/25/18  8:17 AM  Result Value Ref Range Status   Specimen Description   Final    BLOOD LEFT ANTECUBITAL Performed at [place] [address], [place], [place] [address]    Special Requests   Final    BOTTLES DRAWN AEROBIC AND ANAEROBIC Blood Culture adequate volume Performed at [place] [address], [place], [place] [address]    Culture   Final    NO GROWTH 5 DAYS Performed at [facility], [place] [address] [place], [place] [address]    Report Status 09/30/2018 FINAL  Final  Culture, Urine     Status: Abnormal   Collection Time: 09/25/18  9:03 AM  Result Value Ref Range Status   Specimen Description   Final    URINE, RANDOM Performed at [place] [address] [place], [place] [address]    Special Requests   Final    NONE Performed at [facility], [place] [address], [place], [place] [address]    Culture (A)  Final    >=100,000 COLONIES/mL CITROBACTER FREUNDII >=100,000 COLONIES/mL ENTEROCOCCUS FAECALIS    Report Status 09/27/2018 FINAL  Final   Organism ID, Bacteria CITROBACTER FREUNDII (A)  Final   Organism ID, Bacteria ENTEROCOCCUS FAECALIS (A)  Final      Susceptibility   Citrobacter freundii - MIC*    CEFAZOLIN >=64 RESISTANT Resistant     CEFTRIAXONE <=1 SENSITIVE Sensitive     CIPROFLOXACIN <=0.25 SENSITIVE Sensitive     GENTAMICIN <=1 SENSITIVE Sensitive     IMIPENEM 0.5 SENSITIVE Sensitive      NITROFURANTOIN 32 SENSITIVE Sensitive     TRIMETH/SULFA <=20 SENSITIVE Sensitive     PIP/TAZO <=4 SENSITIVE Sensitive     * >=100,000 COLONIES/mL CITROBACTER FREUNDII   Enterococcus faecalis - MIC*    AMPICILLIN <=2 SENSITIVE Sensitive  LEVOFLOXACIN 2 SENSITIVE Sensitive     NITROFURANTOIN <=16 SENSITIVE Sensitive     VANCOMYCIN 2 SENSITIVE Sensitive     * >=100,000 COLONIES/mL ENTEROCOCCUS FAECALIS  Culture, respiratory (non-expectorated)     Status: None   Collection Time: 09/28/18 11:06 AM  Result Value Ref Range Status   Specimen Description   Final    TRACHEAL ASPIRATE Performed at [facility] [address] [place], [place] [address]    Special Requests   Final    Normal Performed at [facility], [place] [address] [place], [place] [address]    Gram Stain   Final    RARE WBC PRESENT, PREDOMINANTLY PMN RARE GRAM POSITIVE COCCI RARE GRAM POSITIVE RODS    Culture   Final    FEW Consistent with normal respiratory flora. Performed at [facility], [place] [address] [place] [address], [place], [place] [address]    Report Status 10/01/2018 FINAL  Final  SARS Coronavirus 2 [facility] order, Performed in [place] hospital lab)     Status: Abnormal   Collection Time: 09/28/18 11:18 AM  Result Value Ref Range Status   SARS Coronavirus 2 POSITIVE (A) NEGATIVE Corrected    Comment: RESULT CALLED TO, READ BACK BY AND VERIFIED WITH: TETREAULT, H RN '@[email or web address]'  ON 09/28/2018 JACKSON,K Performed at [facility], [place] [address], [place], [place] [address] CORRECTED ON 04/18 AT 1954: PREVIOUSLY REPORTED AS POSITIVE CRITICAL RESULT CALLED TO, READ BACK BY AND VERIFIED WITH: Quentin Angst RN '@[email or web address]'  ON 09/28/2018 [facility]       Radiology Studies: No results found.     LOS: 22 days   Maheen Cwikla [employer] on [email or web address]  10/01/2018, 10:06 AM

## 2018-10-01 NOTE — Evaluation (Signed)
Clinical/Bedside Swallow Evaluation Patient Details  Name: Sabrina Baker MRN: [id number] Date of Birth: 11-Oct-1940  Today's Date: 10/01/2018 Time: SLP Start Time (ACUTE ONLY): 69 SLP Stop Time (ACUTE ONLY): 1100 SLP Time Calculation (min) (ACUTE ONLY): 41 min  Past Medical History:  Past Medical History:  Diagnosis Date  . Chronic kidney disease    stage 3  . Diabetes mellitus without complication ([place])   . Hypercholesteremia   . Hypertension   . Neuromuscular disorder (HCC)    neuropathy  . Osteopenia   . Retinal detachment   . TIA (transient ischemic attack)   . Vitamin B12 deficiency    Past Surgical History:  Past Surgical History:  Procedure Laterality Date  . [place] VITRECTOMY WITH 20 GAUGE MVR PORT Right 03/16/2015   Procedure: 25 GAUGE PARS PLANA VITRECTOMY WITH 23 GAUGE MVR PORT;  Surgeon: Hayden Pedro, MD;  Location: [facility];  Service: Ophthalmology;  Laterality: Right;  . ANKLE SURGERY    . BREAST EXCISIONAL BIOPSY    . BREAST LUMPECTOMY WITH RADIOACTIVE SEED LOCALIZATION Right 01/24/2016   Procedure: RIGHT BREAST LUMPECTOMY WITH RADIOACTIVE SEED LOCALIZATION;  Surgeon: Coralie Keens, MD;  Location: [facility];  Service: General;  Laterality: Right;  . EYE SURGERY     detached retna X2  . HEEL SPUR SURGERY  2012  . INJECTION OF SILICONE OIL Right 46/07/7033   Procedure: Extraction OF SILICONE OIL;  Surgeon: Hayden Pedro, MD;  Location: [facility];  Service: Ophthalmology;  Laterality: Right;  . ORIF ANKLE FRACTURE Left 06/17/2014   Procedure: OPEN REDUCTION INTERNAL FIXATION (ORIF) LEFT BIMALLEOLAR ANKLE FRACTURE;  Surgeon: Marianna Payment, MD;  Location: [facility];  Service: Orthopedics;  Laterality: Left;  . PHOTOCOAGULATION WITH LASER Right 03/16/2015   Procedure: PHOTOCOAGULATION WITH LASER;  Surgeon: Hayden Pedro, MD;  Location: [facility];  Service: Ophthalmology;  Laterality: Right;  . SHOULDER ARTHROSCOPY Right    plates and screws   . VITRECTOMY Right 03/16/2015  . WRIST ARTHROPLASTY Left    plates and screws and bone graft   HPI:  Patient is a 78 y.o. female with PMHx of HTN, DM, CKD stage III, presented with shortness of breath and cough, found to have acute hypoxic respiratory failure due to ARDS in the setting of COVID-19 infection. ETT 3/30-4/15, stridor and vocal fold edema; reintubated 4/15-4/20.    Assessment / Plan / Recommendation Clinical Impression  Pt presents with a post-extubation dysphagia marked by signs of glottal incompetence with mild hoarseness, weak cough, consistent coughing after multiple trials of tspns of water.  Did not attempt three oz water test based on impaired performance with lesser quantities.  Pt consumed her meds whole in yogurt with precautions taken to help protect her airway.  She demonstrated intermittent belching and described a hx of reflux.  SP02 was maintained in the 90s on 3 liters [place]; it dropped briefly into 80s after consuming thin liquids.  Overall, based on clinical assessment, swallowing was surprisingly functional given length of oral intubation x2.  For today, maintain NPO excluding ice chips after oral care; allow meds whole in puree (give one at a time, cue patient to swallow hard, cough, and re-swallow).  SLP will f/u next date for improvements and potential for diet progression.  (No instrumental swallow studies are available at the [facility] facility under the conditions of pandemic.) SLP Visit Diagnosis: Dysphagia, unspecified (R13.10)    Aspiration Risk  Moderate aspiration risk  Diet Recommendation     Medication Administration: Whole meds with puree    Other  Recommendations Oral Care Recommendations: Oral care prior to ice chip/H20   Follow up Recommendations Other (comment)(tba)      Frequency and Duration min 3x week  2 weeks       Prognosis Barriers to Reach Goals: Cognitive deficits      Swallow Study   General HPI: Patient is a 78 y.o.  female with PMHx of HTN, DM, CKD stage III, presented with shortness of breath and cough, found to have acute hypoxic respiratory failure due to ARDS in the setting of COVID-19 infection. ETT 3/30-4/15, stridor and vocal fold edema; reintubated 4/15-4/20.  Type of Study: Bedside Swallow Evaluation Diet Prior to this Study: NPO Temperature Spikes Noted: No Respiratory Status: Nasal cannula(2 liters) History of Recent Intubation: Yes Length of Intubations (days): 21 days(over two intubations) Date extubated: 09/30/18 Behavior/Cognition: Alert;Confused Oral Cavity Assessment: Within Functional Limits Oral Care Completed by SLP: Recent completion by staff Oral Cavity - Dentition: Edentulous Self-Feeding Abilities: Needs assist Patient Positioning: Upright in bed Baseline Vocal Quality: Hoarse Volitional Cough: Weak Volitional Swallow: Able to elicit    Oral/Motor/Sensory Function Overall Oral Motor/Sensory Function: Within functional limits   Ice Chips Ice chips: Within functional limits   Thin Liquid Thin Liquid: Impaired Presentation: Spoon Pharyngeal  Phase Impairments: Cough - Immediate    Nectar Thick Nectar Thick Liquid: Not tested   Honey Thick Honey Thick Liquid: Not tested   Puree Puree: Impaired Pharyngeal Phase Impairments: Multiple swallows   Solid     Solid: Not tested      Juan Quam Laurice 10/01/2018,11:04 AM   Estill Bamberg L. Tivis Ringer, [place] Office number [phone] Pager [phone]

## 2018-10-01 NOTE — Progress Notes (Signed)
Nutrition Follow-up  RD working remotely.  DOCUMENTATION CODES:   Not applicable  INTERVENTION:    RD to add PO supplements when diet is advanced.   If unable to begin PO diet within next 24 hours, suggest placing NGT for tube feeding. Glucerna 1.2 at 60 ml/h would provide 1728 kcal, 86 gm protein, 1159 ml free water daily.  NUTRITION DIAGNOSIS:   Inadequate oral intake related to inability to eat as evidenced by NPO status.  Ongoing  GOAL:   Patient will meet greater than or equal to 90% of their needs  Unmet  MONITOR:   Diet advancement, PO intake, Labs, Skin  ASSESSMENT:   78 yo female with PMH of DM, HLD, HTN, CKD-3, neuromuscular disorder, B12 deficiency, osteopenia who was admitted with progressive SOB requiring intubation.  Patient was extubated 4/20. SLP following. Remains NPO today, hopeful for diet advancement tomorrow.   Labs reviewed. Sodium 146 (H), BUN 71 (H) but trending down, creatinine 1.14 (H) but trending down Medications reviewed and include Decadron, Novolog.  NUTRITION - FOCUSED PHYSICAL EXAM:  unable to complete  Diet Order:   Diet Order            Diet NPO time specified  Diet effective now              EDUCATION NEEDS:   No education needs have been identified at this time  Skin:  Skin Assessment: Reviewed RN Assessment(MASD to buttocks)  Last BM:  4/19 (type 7)  Height:   Ht Readings from Last 1 Encounters:  09/09/18 5\' 3"  (1.6 m)    Weight:   Wt Readings from Last 1 Encounters:  09/23/18 75.1 kg    Ideal Body Weight:  52.3 kg  BMI:  Body mass index is 29.33 kg/m.  Estimated Nutritional Needs:   Kcal:  1600-1800  Protein:  80-100 gm  Fluid:  1.6 L    Molli Barrows, RD, LDN, [place] Pager [phone] After Hours Pager [phone]

## 2018-10-02 LAB — CBC
HCT: 34.2 % — ABNORMAL LOW (ref 36.0–46.0)
Hemoglobin: 10.1 g/dL — ABNORMAL LOW (ref 12.0–15.0)
MCH: 29.8 pg (ref 26.0–34.0)
MCHC: 29.5 g/dL — ABNORMAL LOW (ref 30.0–36.0)
MCV: 100.9 fL — ABNORMAL HIGH (ref 80.0–100.0)
Platelets: 702 10*3/uL — ABNORMAL HIGH (ref 150–400)
RBC: 3.39 MIL/uL — ABNORMAL LOW (ref 3.87–5.11)
RDW: 15.4 % (ref 11.5–15.5)
WBC: 23.7 10*3/uL — ABNORMAL HIGH (ref 4.0–10.5)
nRBC: 0 % (ref 0.0–0.2)

## 2018-10-02 LAB — BASIC METABOLIC PANEL WITH GFR
Anion gap: 10 (ref 5–15)
BUN: 85 mg/dL — ABNORMAL HIGH (ref 8–23)
CO2: 26 mmol/L (ref 22–32)
Calcium: 8.1 mg/dL — ABNORMAL LOW (ref 8.9–10.3)
Chloride: 112 mmol/L — ABNORMAL HIGH (ref 98–111)
Creatinine, Ser: 1.7 mg/dL — ABNORMAL HIGH (ref 0.44–1.00)
GFR calc Af Amer: 33 mL/min — ABNORMAL LOW
GFR calc non Af Amer: 29 mL/min — ABNORMAL LOW
Glucose, Bld: 112 mg/dL — ABNORMAL HIGH (ref 70–99)
Potassium: 3.8 mmol/L (ref 3.5–5.1)
Sodium: 148 mmol/L — ABNORMAL HIGH (ref 135–145)

## 2018-10-02 LAB — GLUCOSE, CAPILLARY
Glucose-Capillary: 82 mg/dL (ref 70–99)
Glucose-Capillary: 98 mg/dL (ref 70–99)
Glucose-Capillary: 145 mg/dL — ABNORMAL HIGH (ref 70–99)

## 2018-10-02 MED ORDER — PANTOPRAZOLE SODIUM 40 MG PO TBEC
40.0000 mg | DELAYED_RELEASE_TABLET | Freq: Every day | ORAL | Status: DC
  Administered 2018-10-03 – 2018-10-11 (×9): 40 mg via ORAL
  Filled 2018-10-02 (×9): qty 1

## 2018-10-02 MED ORDER — LIP MEDEX EX OINT
TOPICAL_OINTMENT | CUTANEOUS | Status: DC | PRN
  Administered 2018-10-03: 10:00:00 via TOPICAL
  Filled 2018-10-02: qty 7

## 2018-10-02 MED ORDER — DEXTROSE 5 % IV SOLN
INTRAVENOUS | Status: AC
  Administered 2018-10-02 – 2018-10-03 (×2): via INTRAVENOUS

## 2018-10-02 MED ORDER — CARVEDILOL 3.125 MG PO TABS
6.2500 mg | ORAL_TABLET | Freq: Two times a day (BID) | ORAL | Status: DC
  Administered 2018-10-02 – 2018-10-11 (×18): 6.25 mg via ORAL
  Filled 2018-10-02 (×2): qty 2
  Filled 2018-10-02 (×5): qty 1
  Filled 2018-10-02: qty 2
  Filled 2018-10-02 (×3): qty 1
  Filled 2018-10-02: qty 2
  Filled 2018-10-02 (×4): qty 1
  Filled 2018-10-02 (×2): qty 2
  Filled 2018-10-02 (×4): qty 1
  Filled 2018-10-02: qty 2
  Filled 2018-10-02: qty 1
  Filled 2018-10-02 (×2): qty 2
  Filled 2018-10-02: qty 1

## 2018-10-02 NOTE — Plan of Care (Signed)
Pt worked with speech therapy today to progress to a Dysphagia 1 with nectar thick liquids. Will continue to monitor  Hiram Gash RN

## 2018-10-02 NOTE — Progress Notes (Signed)
  Speech Language Pathology Treatment: Dysphagia  Patient Details Name: Sabrina Baker MRN: [id number] DOB: [birth year] Today's Date: 10/02/2018 Time: 4562-5638 SLP Time Calculation (min) (ACUTE ONLY): 30 min  Assessment / Plan / Recommendation Clinical Impression  Pt's voice remains dysphonic, although improved from previous date per evaluating SLP. She likes to keep her eyes closed, but as session continued, she became more aware of task, and had more appropriate anticipation of subsequent boluses. Although she has multiple swallows and delayed cough with thin liquids, her swallow appears to be swifter and without overt signs of aspiration when given nectar thick liquids and purees (four ounces of each were consumed in large but alternating boluses). Although risk for aspiration is present given mentation, length of intubation, and deconditioning, it appears to be less so with these consistencies. Allowing her to start swallowing would also allow her to work on regaining some of her strength and function. Recommend starting Dys 1 (puree) diet and nectar thick liquids with careful use of aspiration and reflux precautions. SLP will f/u closely for tolerance and readiness to advance.   HPI HPI: Patient is a 78 y.o. female with PMHx of HTN, DM, CKD stage III, presented with shortness of breath and cough, found to have acute hypoxic respiratory failure due to ARDS in the setting of COVID-19 infection. ETT 3/30-4/15, stridor and vocal fold edema; reintubated 4/15-4/20.       SLP Plan  Continue with current plan of care       Recommendations  Diet recommendations: Dysphagia 1 (puree);Nectar-thick liquid Liquids provided via: Straw;Teaspoon Medication Administration: Whole meds with puree Supervision: Staff to assist with self feeding;Full supervision/cueing for compensatory strategies Compensations: Minimize environmental distractions;Slow rate;Small sips/bites Postural Changes and/or Swallow  Maneuvers: Seated upright 90 degrees;Upright 30-60 min after meal                Oral Care Recommendations: Oral care BID Follow up Recommendations: Skilled Nursing facility SLP Visit Diagnosis: Dysphagia, unspecified (R13.10) Plan: Continue with current plan of care       GO                Sabrina Baker Sabrina Baker 10/02/2018, 5:05 PM  Sabrina Baker, M.A. [place] Acute Environmental education officer [phone] Office [phone]

## 2018-10-02 NOTE — Evaluation (Signed)
Physical Therapy Evaluation Patient Details Name: Sabrina Baker MRN: [id number] DOB: 1940-09-06 Today's Date: 10/02/2018   History of Present Illness  78 y.o. female with PMHx of HTN, DM, CKD stage III, presented with shortness of breath and cough, found to have acute hypoxic respiratory failure due to ARDS in the setting of COVID-19 infection. ETT 3/30-4/15, stridor and vocal fold edema; reintubated 4/15-4/20.    Clinical Impression  The patient  Is awake, oriented to self and hospital  And follows  1 step Commands with delay and multimodal cues.  Patient tolerated sitting at the bed edge with 2 assisting x ~ 8 minutes. Patient on 2 Liters [place] with VS stable. Patient will most like require post acute rehab setting  After DC. Pt admitted with above diagnosis. Pt currently with functional limitations due to the deficits listed below (see PT Problem List).  Pt will benefit from skilled PT to increase their independence and safety with mobility to allow discharge to the venue listed below.    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       Follow Up Recommendations SNF    Equipment Recommendations  None recommended by PT    Recommendations for Other Services       Precautions / Restrictions Precautions Precautions: Fall Precaution Comments: monotor VS, incontinent BM(check)      Mobility  Bed Mobility Overal bed mobility: Needs Assistance Bed Mobility: Rolling Rolling: Min assist;Mod assist   Supine to sit: Max assist;+2 for physical assistance;+2 for safety/equipment;HOB elevated Sit to supine: Mod assist   General bed mobility comments: rolls readily with multimodal cues to L/R, initiatedlegs to bed edge, required assist to complete. Assist  with trunk to up right position. Initiated legs onto bed, min assist to complete. Assist with trunk.  Transfers                 General transfer comment:  Defered for safety  Ambulation/Gait                Stairs            Wheelchair Mobility    Modified Rankin (Stroke Patients Only)       Balance Overall balance assessment: Needs assistance Sitting-balance support: Bilateral upper extremity supported;Feet supported   Sitting balance - Comments: intermittently supports self in midline. Sat bed edge x 8 minutes. tendency to posterior lean. frequent  cues to self adjust postition.                                     Pertinent Vitals/Pain Pain Assessment: Faces Faces Pain Scale: No hurt    Home Living Family/patient expects to be discharged to:: Private residence Living Arrangements: Alone Available Help at Discharge: Friend(s);Available PRN/intermittently;Family Type of Home: House Home Access: Stairs to enter Entrance Stairs-Rails: None Entrance Stairs-Number of Steps: 5-6   Home Equipment: [place] - 2 wheels;Bedside commode Additional Comments: info from previous encounter, pt. unreliable at this time    Prior Function Level of Independence: Independent with assistive device(s)         Comments: Uses RW. ADLs and IADLs. Unsure of reliability due to confusion82from previous encounter)     Hand Dominance   Dominant Hand: Right    Extremity/Trunk Assessment   Upper Extremity Assessment Upper Extremity Assessment: Defer to OT evaluation    Lower Extremity  Assessment Lower Extremity Assessment: Generalized weakness;RLE deficits/detail;LLE deficits/detail RLE Deficits / Details: patient readily raises legs from bed, moves legs to roll LLE Deficits / Details: same as right    Cervical / Trunk Assessment Cervical / Trunk Exceptions: Poor trunk control , gradually able to sit near midline, held onto bed at times.  Communication   Communication: No difficulties  Cognition Arousal/Alertness: Awake/alert Behavior During Therapy: Restless Overall Cognitive Status: No family/caregiver  present to determine baseline cognitive functioning Area of Impairment: Following commands;Attention;Memory;Safety/judgement;Awareness;Problem solving                   Current Attention Level: Selective Memory: Decreased short-term memory Following Commands: Follows one step commands inconsistently;Follows one step commands with increased time Safety/Judgement: Decreased awareness of safety;Decreased awareness of deficits Awareness: Intellectual Problem Solving: Slow processing;Requires verbal cues General Comments: pt. oriented to [place], states here due to broken bones.       General Comments      Exercises     Assessment/Plan    PT Assessment Patient needs continued PT services  PT Problem List Decreased strength;Decreased cognition;Cardiopulmonary status limiting activity;Decreased activity tolerance;Decreased knowledge of use of DME;Decreased balance;Decreased safety awareness;Decreased mobility;Decreased knowledge of precautions       PT Treatment Interventions DME instruction;Therapeutic exercise;Gait training;Functional mobility training;Therapeutic activities;Patient/family education;Cognitive remediation    PT Goals (Current goals can be found in the Care Plan section)  Acute Rehab PT Goals Patient Stated Goal: I want  some ice water. PT Goal Formulation: Patient unable to participate in goal setting Time For Goal Achievement: 10/16/18 Potential to Achieve Goals: Good    Frequency Min 4X/week   Barriers to discharge Decreased caregiver support      Co-evaluation               AM-PAC PT "6 Clicks" Mobility  Outcome Measure Help needed turning from your back to your side while in a flat bed without using bedrails?: Total Help needed moving from lying on your back to sitting on the side of a flat bed without using bedrails?: Total Help needed moving to and from a bed to a chair (including a wheelchair)?: Total Help needed standing up from a  chair using your arms (e.g., wheelchair or bedside chair)?: Total Help needed to walk in hospital room?: Total Help needed climbing 3-5 steps with a railing? : Total 6 Click Score: 6    End of Session   Activity Tolerance: Patient tolerated treatment well Patient left: in bed;with call bell/phone within reach;with nursing/sitter in room Nurse Communication: Mobility status PT Visit Diagnosis: Muscle weakness (generalized) (M62.81);Difficulty in walking, not elsewhere classified (R26.2)    Time: 1330-1400 PT Time Calculation (min) (ACUTE ONLY): 30 min   Charges:   PT Evaluation $PT Eval Moderate Complexity: 1 Mod PT Treatments $Therapeutic Activity: 8-22 mins        Tresa Endo PT Acute Rehabilitation Services Pager [phone] Office [phone]   Claretha Cooper 10/02/2018, 3:02 PM

## 2018-10-02 NOTE — Progress Notes (Signed)
PROGRESS NOTE  Sabrina Baker YSA:[id number] DOB: 1941-03-13 DOA: 09/09/2018  PCP: [name], MD  Brief History/Interval Summary: Patient is a 78 y.o. female with PMHx of HTN, DM, CKD stage III, presented with shortness of breath and cough, found to have acute hypoxic respiratory failure due to ARDS in the setting of COVID-19 infection.  Consultants: Pulmonology  Procedures:   3/30>> ETT 3/21>> RUE PICC 4/10>> A line-out 4/15>>extubated 4/15>>ETT (re-intubated)  Significant hospital events: 3/30Admitted, intubated 3/31>>4/5 Plaquenil 4/13 transfer to [facility] 4/15 Extubated 4/15 Stridor-cord edema-reintubated 4/20: Extubated    Antibiotics: Anti-infectives (From admission, onward)   Start     Dose/Rate Route Frequency Ordered Stop   09/27/18 1300  cefTRIAXone (ROCEPHIN) 2 g in sodium chloride 0.9 % 100 mL IVPB     2 g 200 mL/hr over 30 Minutes Intravenous Every 24 hours 09/27/18 0928     09/26/18 1400  ampicillin (OMNIPEN) 1 g in sodium chloride 0.9 % 100 mL IVPB     1 g 300 mL/hr over 20 Minutes Intravenous Every 8 hours 09/26/18 1204     09/26/18 1300  cefTRIAXone (ROCEPHIN) 1 g in sodium chloride 0.9 % 100 mL IVPB  Status:  Discontinued     1 g 200 mL/hr over 30 Minutes Intravenous Every 24 hours 09/26/18 1204 09/27/18 0928   09/11/18 2200  hydroxychloroquine (PLAQUENIL) tablet 200 mg  Status:  Discontinued     200 mg Oral 2 times daily 09/10/18 2309 09/10/18 2319   09/11/18 2200  hydroxychloroquine (PLAQUENIL) tablet 200 mg     200 mg Per Tube 2 times daily 09/10/18 2319 09/15/18 0851   09/10/18 2330  hydroxychloroquine (PLAQUENIL) tablet 400 mg     400 mg Per Tube 2 times daily 09/10/18 2319 09/11/18 0823   09/10/18 2315  hydroxychloroquine (PLAQUENIL) tablet 400 mg  Status:  Discontinued     400 mg Oral 2 times daily 09/10/18 2309 09/10/18 2319   09/10/18 1300  azithromycin (ZITHROMAX) 500 mg in sodium chloride 0.9 % 250 mL IVPB     500 mg 250 mL/hr over 60  Minutes Intravenous Every 24 hours 09/09/18 1342 09/15/18 1531   09/10/18 1200  cefTRIAXone (ROCEPHIN) 1 g in sodium chloride 0.9 % 100 mL IVPB  Status:  Discontinued     1 g 200 mL/hr over 30 Minutes Intravenous Every 24 hours 09/09/18 1342 09/14/18 1436   09/09/18 1200  cefTRIAXone (ROCEPHIN) 1 g in sodium chloride 0.9 % 100 mL IVPB     1 g 200 mL/hr over 30 Minutes Intravenous  Once 09/09/18 1148 09/09/18 1248   09/09/18 1200  azithromycin (ZITHROMAX) 500 mg in sodium chloride 0.9 % 250 mL IVPB     500 mg 250 mL/hr over 60 Minutes Intravenous  Once 09/09/18 1148 09/09/18 1351      Subjective/Interval History: Patient seems to be doing well from a respiratory standpoint.  Patient denies any complaints this morning.  Not as talkative today as yesterday.  Following commands.  According to nursing staff she developed fever overnight but no fever has been recorded in the vital signs.   Assessment/Plan:  Acute Hypoxic Resp. Failure due to Acute Covid 19 Viral Illness during the ongoing 2020 Covid 19 Pandemic She was extubated on 4/15-but then developed stridor 2/2 cord edema and was reintubated the same day.   Plan was for tracheostomy in the near future.  However she was retested and was again noted to be positive for SARS COV2.  Patient was successfully  extubated on 4/20.  Her respiratory status is stable.  She was taken off of dexamethasone.  PT OT and speech therapy to see.  Continue oxygen by nasal cannula.  Elevated WBC noted today.  No diarrhea per nursing staff.  No other source of infection.  She apparently did have fever overnight.  We will continue to monitor for now.  She remains on antibiotics for UTI as discussed below.  May need to consider checking for DVT.  The treatment plan and use of medications and known side effects were discussed with patient/family, they were clearly explained that there is no proven definitive treatment for COVID-19 infection, any medications used here are  based on published clinical articles/anecdotal data which are not peer-reviewed or randomized control trials.  Complete risks and long-term side effects are unknown, however in the best clinical judgment they seem to be of some clinical benefit rather than medical risks.  Patient/family agree with the treatment plan and want to receive the given medications.  Patient was given hydroxychloroquine.   UTI Patient was diagnosed as having a UTI.  Her urine culture report showed enterococcus and Citrobacter.  She was started on ampicillin and ceftriaxone on 4/16. Plan was to give her antibiotics for at least 7 days.  This will complete on April 23.  It appears that patient developed a fever overnight although no high temperatures have been recorded.  Apparently patient was given acetaminophen around 2 AM this morning.  WBC noted to be significantly elevated.  Initially thought to be due to steroids but this is a significant jump.  She does not have any diarrhea.  Continue to monitor clinically for now.  Will not change her antibiotics.  If she becomes febrile again may need to do blood cultures.     Abnormal thyroid function tests TSH is 0.551 with free T4 of 4.3 and free T3 of 0.8.  Patient without any known history of thyroid disease.  Free T4 and free T3 are below the normal range.    She was started on low-dose levothyroxine which is being continued.  Agitation/delirium Patient was noted to be quite sedated on Klonopin so this was discontinued.  She remains on Seroquel.  Haldol as needed.  Mentation appears to be appropriate though she is distracted.  AKI on CKD stage III/mild hypernatremia AKI likely hemodynamically mediated.  Patient had improved and her renal function was back to baseline.  Creatinine noted to have increased to 1.7 today BUN 85.  This could be due to poor oral intake.  Speech therapy to reevaluate patient today.  We will put her on IV fluids.  Sodium level is noted to be high.   Hopefully the D5 infusion will help.    Accelerated hypertension Had episodes of hypotension previously but now noted to be hypertensive. She was on Levophed previously but has been titrated off.    She was started back on amlodipine and carvedilol.  Monitor for now.  May need to go up on the dose of carvedilol.   Paroxysmal atrial fibrillation Maintaining sinus rhythm. Continue Eliquis.  Macrocytic anemia Anemia could be secondary to critical illness.  No evidence for overt bleeding.    Hemoglobin remained stable.  Anemia panel reviewed.  Ferritin 467.  Folate 19.4.  B12 2058.  Thyroid function tests as discussed above.   DM-2/Hypoglycemia Patient had episodes of hypoglycemia and so her Lantus was discontinued.  Continue SSI.  HbA1c 7.3.  Dexamethasone is also been discontinued.  We will place her  on a D5 infusion due to ability to take orally and rise in BUN and creatinine.    Depression Resume Cymbalta when extubated and able to take orally.  FEN Started on IV fluids.  Electrolytes being monitored closely.  Resume diet when cleared to do so by speech therapy   DVT Prophylaxis: On Eliquis  Lab Results  Component Value Date   PLT 702 (H) 10/02/2018     PUD Prophylaxis: On Protonix  Code Status: Full code Family Communication: We will discuss with family Disposition Plan: Okay for transfer to progressive unit   Medications:  Scheduled: . apixaban  5 mg Oral BID  . carvedilol  3.125 mg Oral BID WC  . chlorhexidine  15 mL Mouth/Throat BID  . Chlorhexidine Gluconate Cloth  6 each Topical Daily  . feeding supplement (PRO-STAT SUGAR FREE 64)  30 mL Oral TID  . gabapentin  100 mg Oral QHS  . insulin aspart  0-15 Units Subcutaneous Q4H  . levothyroxine  25 mcg Oral QAC breakfast  . pantoprazole sodium  40 mg Oral Daily  . polyethylene glycol  17 g Oral Daily  . QUEtiapine  50 mg Oral BID  . sodium chloride flush  10-40 mL Intracatheter Q12H   Continuous: . sodium  chloride 10 mL/hr at 10/01/18 0700  . sodium chloride 10 mL/hr at 10/01/18 2000  . ampicillin (OMNIPEN) IV 1 g (10/02/18 0659)  . cefTRIAXone (ROCEPHIN)  IV Stopped (10/01/18 2041)   TKW:IOXBDZ chloride, acetaminophen (TYLENOL) oral liquid 160 mg/5 mL, albuterol, haloperidol lactate, hydrALAZINE, Resource ThickenUp Clear, sodium chloride flush   Objective:  Vital Signs  Vitals:   10/02/18 0600 10/02/18 0605 10/02/18 0700 10/02/18 0835  BP:  138/64 (!) 151/59 (!) 147/61  Pulse:  98 97 100  Resp:  19 16   Temp:      TempSrc:      SpO2: 96% 96% 95%   Weight:      Height:        Intake/Output Summary (Last 24 hours) at 10/02/2018 1027 Last data filed at 10/02/2018 0700 Gross per 24 hour  Intake 999.55 ml  Output 457 ml  Net 542.55 ml   Filed Weights   09/22/18 0500 09/23/18 0516  Weight: 76.8 kg 75.1 kg   General appearance: Patient is awake alert.  Not as talkative today as yesterday.  Remains distracted.   Resp: Coarse breath sounds bilaterally.  Mildly tachypneic.  No wheezing or rhonchi.   Cardio: S1-S2 is normal regular.  No S3-S4.  No rubs murmurs or bruit GI: Abdomen is soft.  Nontender nondistended.  Bowel sounds are present normal.  No masses organomegaly Extremities: No edema.  Full range of motion of lower extremities. Neurologic: No focal neurological deficits.   Lab Results:  Data Reviewed: I have personally reviewed following labs and imaging studies  CBC: Recent Labs  Lab 09/28/18 0235 09/28/18 0435 09/29/18 0310 09/30/18 0500 10/01/18 0235 10/02/18 0315  WBC 12.1*  --  11.0* 12.1* 16.0* 23.7*  HGB 8.2* 8.5* 8.3* 8.9* 9.3* 10.1*  HCT 26.9* 25.0* 28.0* 29.4* 30.7* 34.2*  MCV 101.1*  --  101.1* 101.0* 101.0* 100.9*  PLT 528*  --  526* 557* 605* 702*    Basic Metabolic Panel: Recent Labs  Lab 09/28/18 0235 09/28/18 0435 09/29/18 0310 09/30/18 0500 10/01/18 0235 10/02/18 0315  NA 143 140 144 145 146* 148*  K 5.2* 5.3* 4.6 4.2 4.2 3.8  CL  101  --  103 107 108 112*  CO2 32  --  31 32 30 26  GLUCOSE 272*  --  214* 203* 84 112*  BUN 112*  --  101* 85* 71* 85*  CREATININE 1.72*  --  1.59* 1.24* 1.14* 1.70*  CALCIUM 8.3*  --  8.5* 8.2* 8.1* 8.1*  MG 2.4  --   --   --   --   --   PHOS 5.5*  --   --   --   --   --     GFR: Estimated Creatinine Clearance: 26.9 mL/min (A) (by C-G formula based on SCr of 1.7 mg/dL (H)).  Coagulation Profile: Recent Labs  Lab 09/26/18 1200  INR 1.6*    CBG: Recent Labs  Lab 10/01/18 1126 10/01/18 1547 10/01/18 2008 10/01/18 2337 10/02/18 0811  GLUCAP 131* 97 133* 159* 82     Recent Results (from the past 240 hour(s))  Culture, Urine     Status: Abnormal   Collection Time: 09/22/18  4:00 PM  Result Value Ref Range Status   Specimen Description URINE, CATHETERIZED  Final   Special Requests   Final    Normal Performed at [facility], [place] [address] [place], [place] [address]    Culture (A)  Final    >=100,000 COLONIES/mL ENTEROCOCCUS FAECALIS 10,000 COLONIES/mL CITROBACTER FREUNDII    Report Status 09/24/2018 FINAL  Final   Organism ID, Bacteria ENTEROCOCCUS FAECALIS (A)  Final   Organism ID, Bacteria CITROBACTER FREUNDII (A)  Final      Susceptibility   Citrobacter freundii - MIC*    CEFAZOLIN >=64 RESISTANT Resistant     CEFTRIAXONE <=1 SENSITIVE Sensitive     CIPROFLOXACIN <=0.25 SENSITIVE Sensitive     GENTAMICIN <=1 SENSITIVE Sensitive     IMIPENEM <=0.25 SENSITIVE Sensitive     NITROFURANTOIN <=16 SENSITIVE Sensitive     TRIMETH/SULFA <=20 SENSITIVE Sensitive     PIP/TAZO <=4 SENSITIVE Sensitive     * 10,000 COLONIES/mL CITROBACTER FREUNDII   Enterococcus faecalis - MIC*    AMPICILLIN <=2 SENSITIVE Sensitive     LEVOFLOXACIN 2 SENSITIVE Sensitive     NITROFURANTOIN <=16 SENSITIVE Sensitive     VANCOMYCIN 2 SENSITIVE Sensitive     * >=100,000 COLONIES/mL ENTEROCOCCUS FAECALIS  Culture, respiratory (non-expectorated)     Status: None   Collection Time:  09/22/18  4:00 PM  Result Value Ref Range Status   Specimen Description TRACHEAL ASPIRATE  Final   Special Requests Normal  Final   Gram Stain   Final    MODERATE WBC PRESENT,BOTH PMN AND MONONUCLEAR MODERATE GRAM POSITIVE COCCI IN PAIRS IN CLUSTERS MODERATE GRAM NEGATIVE RODS Performed at [facility], [address], [place], [place] [address]    Culture FEW KLEBSIELLA PNEUMONIAE  Final   Report Status 09/25/2018 FINAL  Final   Organism ID, Bacteria KLEBSIELLA PNEUMONIAE  Final      Susceptibility   Klebsiella pneumoniae - MIC*    AMPICILLIN RESISTANT Resistant     CEFAZOLIN <=4 SENSITIVE Sensitive     CEFEPIME <=1 SENSITIVE Sensitive     CEFTAZIDIME <=1 SENSITIVE Sensitive     CEFTRIAXONE <=1 SENSITIVE Sensitive     CIPROFLOXACIN <=0.25 SENSITIVE Sensitive     GENTAMICIN <=1 SENSITIVE Sensitive     IMIPENEM <=0.25 SENSITIVE Sensitive     TRIMETH/SULFA <=20 SENSITIVE Sensitive     AMPICILLIN/SULBACTAM 4 SENSITIVE Sensitive     PIP/TAZO <=4 SENSITIVE Sensitive     Extended ESBL NEGATIVE Sensitive     *  FEW KLEBSIELLA PNEUMONIAE  Culture, blood (routine x 2)     Status: None   Collection Time: 09/22/18  4:13 PM  Result Value Ref Range Status   Specimen Description BLOOD LEFT ANTECUBITAL  Final   Special Requests   Final    BOTTLES DRAWN AEROBIC ONLY Blood Culture adequate volume   Culture   Final    NO GROWTH 5 DAYS Performed at [facility], [address], [place], [place] [address]    Report Status 09/27/2018 FINAL  Final  Culture, blood (routine x 2)     Status: None   Collection Time: 09/22/18  6:25 PM  Result Value Ref Range Status   Specimen Description BLOOD LEFT HAND  Final   Special Requests   Final    BOTTLES DRAWN AEROBIC AND ANAEROBIC Blood Culture adequate volume   Culture   Final    NO GROWTH 5 DAYS Performed at [facility], [place] [address] [place], [place] [address]    Report Status 09/27/2018 FINAL  Final  Culture, blood (routine x  2)     Status: None   Collection Time: 09/25/18  8:05 AM  Result Value Ref Range Status   Specimen Description   Final    BLOOD LEFT ANTECUBITAL Performed at [facility] [address] [place], [place] [address]    Special Requests   Final    BOTTLES DRAWN AEROBIC AND ANAEROBIC Blood Culture adequate volume Performed at [facility] [address], [place], [place] [address]    Culture   Final    NO GROWTH 5 DAYS Performed at [facility], [place] [address] [place], [place] [address]    Report Status 09/30/2018 FINAL  Final  Culture, blood (routine x 2)     Status: None   Collection Time: 09/25/18  8:17 AM  Result Value Ref Range Status   Specimen Description   Final    BLOOD LEFT ANTECUBITAL Performed at [facility] [address], [place], [place] [address]    Special Requests   Final    BOTTLES DRAWN AEROBIC AND ANAEROBIC Blood Culture adequate volume Performed at [facility] [address] [place], [place] [address]    Culture   Final    NO GROWTH 5 DAYS Performed at [facility], [place] [address], [place], [place] [address]    Report Status 09/30/2018 FINAL  Final  Culture, Urine     Status: Abnormal   Collection Time: 09/25/18  9:03 AM  Result Value Ref Range Status   Specimen Description   Final    URINE, RANDOM Performed at [facility] [address], [place], [place] [address]    Special Requests   Final    NONE Performed at [facility], [place] [address], [place], [place] [address]    Culture (A)  Final    >=100,000 COLONIES/mL CITROBACTER FREUNDII >=100,000 COLONIES/mL ENTEROCOCCUS FAECALIS    Report Status 09/27/2018 FINAL  Final   Organism ID, Bacteria CITROBACTER FREUNDII (A)  Final   Organism ID, Bacteria ENTEROCOCCUS FAECALIS (A)  Final      Susceptibility   Citrobacter freundii - MIC*    CEFAZOLIN >=64 RESISTANT  Resistant     CEFTRIAXONE <=1 SENSITIVE Sensitive     CIPROFLOXACIN <=0.25 SENSITIVE Sensitive     GENTAMICIN <=1 SENSITIVE Sensitive     IMIPENEM 0.5 SENSITIVE Sensitive     NITROFURANTOIN  32 SENSITIVE Sensitive     TRIMETH/SULFA <=20 SENSITIVE Sensitive     PIP/TAZO <=4 SENSITIVE Sensitive     * >=100,000 COLONIES/mL CITROBACTER FREUNDII   Enterococcus faecalis - MIC*    AMPICILLIN <=2 SENSITIVE Sensitive     LEVOFLOXACIN 2 SENSITIVE Sensitive     NITROFURANTOIN <=16 SENSITIVE Sensitive     VANCOMYCIN 2 SENSITIVE Sensitive     * >=100,000 COLONIES/mL ENTEROCOCCUS FAECALIS  Culture, respiratory (non-expectorated)     Status: None   Collection Time: 09/28/18 11:06 AM  Result Value Ref Range Status   Specimen Description   Final    TRACHEAL ASPIRATE Performed at [facility] [address], [place], [place] [address]    Special Requests   Final    Normal Performed at [facility], [place] [address] [place], [place] [address]    Gram Stain   Final    RARE WBC PRESENT, PREDOMINANTLY PMN RARE GRAM POSITIVE COCCI RARE GRAM POSITIVE RODS    Culture   Final    FEW Consistent with normal respiratory flora. Performed at [facility], [place] [address], [place], [place] [address]    Report Status 10/01/2018 FINAL  Final  SARS Coronavirus 2 [facility] order, Performed in [place] hospital lab)     Status: Abnormal   Collection Time: 09/28/18 11:18 AM  Result Value Ref Range Status   SARS Coronavirus 2 POSITIVE (A) NEGATIVE Corrected    Comment: RESULT CALLED TO, READ BACK BY AND VERIFIED WITH: TETREAULT, H RN @1945  ON 09/28/2018 JACKSON,K Performed at [facility], [place] [address] [place],  [address] CORRECTED ON 04/18 AT 1954: PREVIOUSLY REPORTED AS POSITIVE CRITICAL RESULT CALLED TO, READ BACK BY AND VERIFIED WITH: Quentin Angst RN @1945  ON 09/28/2018 [facility]       Radiology Studies: No results found.      LOS: 23 days   Takhia Spoon [employer] on [email or web address]  10/02/2018, 10:27 AM

## 2018-10-03 LAB — COMPREHENSIVE METABOLIC PANEL
ALT: 22 U/L (ref 0–44)
AST: 24 U/L (ref 15–41)
Albumin: 2 g/dL — ABNORMAL LOW (ref 3.5–5.0)
Alkaline Phosphatase: 41 U/L (ref 38–126)
Anion gap: 8 (ref 5–15)
BUN: 74 mg/dL — ABNORMAL HIGH (ref 8–23)
CO2: 28 mmol/L (ref 22–32)
Calcium: 8.1 mg/dL — ABNORMAL LOW (ref 8.9–10.3)
Chloride: 109 mmol/L (ref 98–111)
Creatinine, Ser: 1.55 mg/dL — ABNORMAL HIGH (ref 0.44–1.00)
GFR calc Af Amer: 37 mL/min — ABNORMAL LOW (ref >60)
GFR calc non Af Amer: 32 mL/min — ABNORMAL LOW (ref >60)
Glucose, Bld: 163 mg/dL — ABNORMAL HIGH (ref 70–99)
Potassium: 4 mmol/L (ref 3.5–5.1)
Sodium: 145 mmol/L (ref 135–145)
Total Bilirubin: 0.4 mg/dL (ref 0.3–1.2)
Total Protein: 5.2 g/dL — ABNORMAL LOW (ref 6.5–8.1)

## 2018-10-03 LAB — CBC WITH DIFFERENTIAL/PLATELET
Abs Immature Granulocytes: 0.11 10*3/uL — ABNORMAL HIGH (ref 0.00–0.07)
Basophils Absolute: 0 10*3/uL (ref 0.0–0.1)
Basophils Relative: 0 %
Eosinophils Absolute: 0.9 10*3/uL — ABNORMAL HIGH (ref 0.0–0.5)
Eosinophils Relative: 6 %
HCT: 28.2 % — ABNORMAL LOW (ref 36.0–46.0)
Hemoglobin: 8.2 g/dL — ABNORMAL LOW (ref 12.0–15.0)
Immature Granulocytes: 1 %
Lymphocytes Relative: 11 %
Lymphs Abs: 1.7 10*3/uL (ref 0.7–4.0)
MCH: 29.9 pg (ref 26.0–34.0)
MCHC: 29.1 g/dL — ABNORMAL LOW (ref 30.0–36.0)
MCV: 102.9 fL — ABNORMAL HIGH (ref 80.0–100.0)
Monocytes Absolute: 1.5 10*3/uL — ABNORMAL HIGH (ref 0.1–1.0)
Monocytes Relative: 10 %
Neutro Abs: 11.2 10*3/uL — ABNORMAL HIGH (ref 1.7–7.7)
Neutrophils Relative %: 72 %
Platelets: 535 10*3/uL — ABNORMAL HIGH (ref 150–400)
RBC: 2.74 MIL/uL — ABNORMAL LOW (ref 3.87–5.11)
RDW: 15.9 % — ABNORMAL HIGH (ref 11.5–15.5)
WBC: 15.4 10*3/uL — ABNORMAL HIGH (ref 4.0–10.5)
nRBC: 0 % (ref 0.0–0.2)

## 2018-10-03 LAB — GLUCOSE, CAPILLARY
Glucose-Capillary: 122 mg/dL — ABNORMAL HIGH (ref 70–99)
Glucose-Capillary: 125 mg/dL — ABNORMAL HIGH (ref 70–99)
Glucose-Capillary: 155 mg/dL — ABNORMAL HIGH (ref 70–99)
Glucose-Capillary: 178 mg/dL — ABNORMAL HIGH (ref 70–99)
Glucose-Capillary: 178 mg/dL — ABNORMAL HIGH (ref 70–99)
Glucose-Capillary: 179 mg/dL — ABNORMAL HIGH (ref 70–99)
Glucose-Capillary: 252 mg/dL — ABNORMAL HIGH (ref 70–99)

## 2018-10-03 NOTE — Progress Notes (Signed)
  Speech Language Pathology Treatment: Dysphagia  Patient Details Name: IMAAN PADGETT MRN: [id number] DOB: Mar 08, 1941 Today's Date: 10/03/2018 Time: [id number] SLP Time Calculation (min) (ACUTE ONLY): 23 min  Assessment / Plan / Recommendation Clinical Impression  SLP arrived during breakfast meal. Diet orders had been edited to reflect pt's vegetarian preference, but she ended up receiving soft solids on her tray in addition to purees. Pt was spitting out egg residuals in her oral cavity upon SLP arrival, but per RN had not eaten much of the food and was requesting yogurt. With additional boluses of nectar thick liquids and purees, pt needed Mod cues for awareness and slower bolus sizes. SLP also assisted with implementing rest breaks intermittently for respirations, although VS did remain stable throughout. She coughed x2 after drinking nectar thick liquids, but consumed over 4 ounces in total. As discussed in note on previous date she does have risk factors for aspiration, but this appears overall to be an appropriate diet. Will adjust the orders in an attempt to receive the appropriate textures while still considering vegetarian request. Will continue to follow closely for tolerance. Also reached out to MD to request SLP cognitive evaluation given observed cognitive impairments during swallow therapy. Will f/u for further testing as ordered.   HPI HPI: Patient is a 78 y.o. female with PMHx of HTN, DM, CKD stage III, presented with shortness of breath and cough, found to have acute hypoxic respiratory failure due to ARDS in the setting of COVID-19 infection. ETT 3/30-4/15, stridor and vocal fold edema; reintubated 4/15-4/20.       SLP Plan  Continue with current plan of care       Recommendations  Diet recommendations: Dysphagia 1 (puree);Nectar-thick liquid Liquids provided via: Straw;Teaspoon Medication Administration: Whole meds with puree Supervision: Staff to assist with self  feeding;Full supervision/cueing for compensatory strategies Compensations: Minimize environmental distractions;Slow rate;Small sips/bites Postural Changes and/or Swallow Maneuvers: Seated upright 90 degrees;Upright 30-60 min after meal                Oral Care Recommendations: Oral care BID Follow up Recommendations: Skilled Nursing facility SLP Visit Diagnosis: Dysphagia, unspecified (R13.10) Plan: Continue with current plan of care       GO                Venita Sheffield Thaine Garriga 10/03/2018, 9:05 AM  Pollyann Glen, M.A. [place] Acute Environmental education officer [phone] Office [phone]

## 2018-10-03 NOTE — Progress Notes (Addendum)
Occupational Therapy Treatment Patient Details Name: Sabrina Baker MRN: [id number] DOB: [birth year] Today's Date: 10/03/2018    History of present illness 78 y.o. female with PMHx of HTN, DM, CKD stage III, presented with shortness of breath and cough, found to have acute hypoxic respiratory failure due to ARDS in the setting of COVID-19 infection. ETT 3/30-4/15, stridor and vocal fold edema; reintubated 4/15-4/20.     OT comments  Pt more lethargic this session, however improved postural control sitting EOB with occasional min A for sitting balance. Significant BUE weakness impacting function. VSS during session on 1L. . Will continue to follow acutely. Will attempt to progress mobility with [place] if able.  Updated daughter over the phone who confirmed that pt was completely independent PTA.   Follow Up Recommendations  SNF;Supervision/Assistance - 24 hour    Equipment Recommendations  Other (comment)    Recommendations for Other Services      Precautions / Restrictions Precautions Precautions: Fall Precaution Comments: monotor VS,        Mobility Bed Mobility Overal bed mobility: Needs Assistance Bed Mobility: Rolling;Supine to Sit;Sit to Supine Rolling: Mod assist;Max assist;+2 for physical assistance;+2 for safety/equipment   Supine to sit: Max assist;+2 for physical assistance;+2 for safety/equipment Sit to supine: Max assist;+2 for physical assistance;+2 for safety/equipment   General bed mobility comments: rorequired increased assistance for  mobility today. keeps eyes closed. Saturation 88-93%, HR in 90's  Transfers Overall transfer level: Needs assistance               General transfer comment: Defered for safety    Balance Overall balance assessment: Needs assistance Sitting-balance support: Bilateral upper extremity supported;Feet supported Sitting balance-Leahy Scale: Poor Sitting balance - Comments: intermittently supports self in midline. Sat bed edge  x 10 minutes. tendency to posterior lean. frequent  cues to self adjust postition.                                   ADL either performed or assessed with clinical judgement   ADL Overall ADL's : Needs assistance/impaired Eating/Feeding: Maximal assistance Eating/Feeding Details (indicate cue type and reason): modified diet Grooming: Maximal assistance;Bed level                               Functional mobility during ADLs: Maximal assistance;+2 for physical assistance       Vision   Vision Assessment?: Vision impaired- to be further tested in functional context Additional Comments: wears glasses. Does not have them with her   Perception     Praxis      Cognition Arousal/Alertness: Lethargic Behavior During Therapy: Restless;Flat affect Overall Cognitive Status: Impaired/Different from baseline Area of Impairment: Following commands;Attention;Memory;Safety/judgement;Awareness;Problem solving                   Current Attention Level: Selective Memory: Decreased short-term memory Following Commands: Follows one step commands inconsistently;Follows one step commands with increased time Safety/Judgement: Decreased awareness of safety;Decreased awareness of deficits Awareness: Emergent Problem Solving: Slow processing General Comments: pt. oriented to [place], patient not as alert as previous encounter.        Exercises Exercises: Other exercises;General Upper Extremity General Exercises - Upper Extremity Shoulder Flexion: Both;10 reps;Supine Shoulder ABduction: Both;10 reps;Supine Elbow Flexion: Both;15 reps;Seated Elbow Extension: Both;15 reps;Seated   Shoulder Instructions       General Comments  Pertinent Vitals/ Pain       Pain Assessment: Faces Faces Pain Scale: Hurts little more Pain Location: when moves hand. arms Pain Descriptors / Indicators: Constant;Discomfort;Grimacing Pain Intervention(s): Limited  activity within patient's tolerance  Home Living                                          Prior Functioning/Environment              Frequency  Min 3X/week        Progress Toward Goals  OT Goals(current goals can now be found in the care plan section)  Progress towards OT goals: Progressing toward goals  Acute Rehab OT Goals Patient Stated Goal: I want  some ice water. OT Goal Formulation: Patient unable to participate in goal setting Time For Goal Achievement: 10/15/18 Potential to Achieve Goals: Good ADL Goals Pt Will Perform Grooming: with set-up;with supervision;sitting Pt Will Perform Lower Body Dressing: with mod assist;sit to/from stand;sitting/lateral leans Pt Will Transfer to Toilet: with mod assist;stand pivot transfer;bedside commode Pt Will Perform Toileting - Clothing Manipulation and hygiene: with mod assist;sit to/from stand;sitting/lateral leans Additional ADL Goal #1: Pt will perform bed mobility with Min A in preparation for ADLs Additional ADL Goal #2: Pt willl sustain attention to ADL with Min cues  Plan Discharge plan remains appropriate;Frequency needs to be updated    Co-evaluation    PT/OT/SLP Co-Evaluation/Treatment: Yes Reason for Co-Treatment: Complexity of the patient's impairments (multi-system involvement);For patient/therapist safety PT goals addressed during session: Mobility/safety with mobility OT goals addressed during session: ADL's and self-care;Strengthening/ROM      AM-PAC OT "6 Clicks" Daily Activity     Outcome Measure   Help from another person eating meals?: A Lot Help from another person taking care of personal grooming?: A Lot Help from another person toileting, which includes using toliet, bedpan, or urinal?: A Lot Help from another person bathing (including washing, rinsing, drying)?: A Lot Help from another person to put on and taking off regular upper body clothing?: A Lot Help from another  person to put on and taking off regular lower body clothing?: Total 6 Click Score: 11    End of Session Equipment Utilized During Treatment: Oxygen(1L)  OT Visit Diagnosis: Unsteadiness on feet (R26.81);Other abnormalities of gait and mobility (R26.89);Muscle weakness (generalized) (M62.81);Other symptoms and signs involving cognitive function;Pain Pain - part of body: (general discomfort)   Activity Tolerance Patient limited by fatigue   Patient Left in bed;with call bell/phone within reach;with nursing/sitter in room   Nurse Communication Mobility status        Time: 1212-1240 OT Time Calculation (min): 28 min  Charges: OT General Charges $OT Visit: 1 Visit OT Treatments $Self Care/Home Management : 8-22 mins  Maurie Boettcher, OT/L   Acute OT Clinical Specialist [place] Pager [phone] Office [phone]    [facility] 10/03/2018, 5:47 PM

## 2018-10-03 NOTE — Progress Notes (Signed)
78 year old hypertensive diabetic with CKD admitted 3/30 with ARDS due to COVID-19 infection, she failed extubation on 4/15 due to stridor but then was extubated again on 4/20 and has done well since.  She is only requiring 2 L oxygen. Chest x-ray from 4/19 was reviewed which shows bilateral airspace disease. Repeat testing on 4/18 was positive. She has also been treated for Citrobacter and enterococcus UTI on Klebsiella H CAP PT is recommending SNF, she is on dysphagia 1 diet and is being followed by speech therapy. PCCM available as needed  Sabrina Baker V. [name] MD

## 2018-10-03 NOTE — Discharge Instructions (Addendum)

## 2018-10-03 NOTE — Progress Notes (Addendum)
Nutrition Follow-up RD working remotely.  DOCUMENTATION CODES:   Not applicable  INTERVENTION:    Magic cup BID with meals, each supplement provides 290 kcal and 9 grams of protein  Vital Cuisine Shake once daily, each supplement provides 520 kcal and 22 grams of protein  NUTRITION DIAGNOSIS:   Inadequate oral intake related to dysphagia, acute illness as evidenced by meal completion < 50%.  Ongoing  GOAL:   Patient will meet greater than or equal to 90% of their needs  Progressing  MONITOR:   PO intake, Supplement acceptance, Labs, Skin  ASSESSMENT:   78 yo female with PMH of DM, HLD, HTN, CKD-3, neuromuscular disorder, B12 deficiency, osteopenia who was admitted with progressive SOB requiring intubation.  Patient was extubated 4/20. Currently on nasal cannula.  SLP following for dysphagia. Diet has been advanced to dysphagia 1 with nectar thick liquids. Intake of meals has been poor. RD will add appropriate PO supplements to meal trays.  Labs and medications reviewed.  CBG's: 122-125-179  Diet Order:   Diet Order            DIET - DYS 1 Room service appropriate? Yes; Fluid consistency: Nectar Thick  Diet effective now              EDUCATION NEEDS:   No education needs have been identified at this time  Skin:  Skin Assessment: Reviewed RN Assessment(MASD to buttocks)  Last BM:  4/22 (type7)  Height:   Ht Readings from Last 1 Encounters:  09/09/18 5\' 3"  (1.6 m)    Weight:   Wt Readings from Last 1 Encounters:  06/21/17 70.3 kg    Ideal Body Weight:  52.3 kg  BMI:  Body mass index is 29.33 kg/m.  Estimated Nutritional Needs:   Kcal:  1600-1800  Protein:  80-100 gm  Fluid:  1.6 L    Molli Barrows, RD, LDN, [place] Pager [phone] After Hours Pager [phone]

## 2018-10-03 NOTE — Progress Notes (Signed)
[place] TEAM 1 - Stepdown/ICU TEAM  Sabrina Baker  NLZ:[id number] DOB: Jul 01, 1940 DOA: 09/09/2018 PCP: [name], MD    Brief Narrative:  78 y.o.femalewith PMHx of HTN, DM, CKD stage III, presented with shortness of breath and cough, found to have acute hypoxic respiratory failure due to ARDS in the setting of COVID-19 infection.  Significant Events: 3/30Admitted, intubated 3/21 R UE PICC 3/31>4/5 Plaquenil 4/13 transfer to [facility] 4/15 Extubated 4/15 Stridor-cord edema > reintubated 4/21 extubated   COVID-19 specific Treatment: Plaquenil 3/31 > 4/5  Ventilator Settings: NA  Subjective: The pt is awake but confused. She can not provide a reliable hx.   Assessment & Plan:  Acute Hypoxic Resp. Failure due to Covid 19 ARDS Extubated successfully on 4/21   Enterococcus and Citrobacter UTI started on ampicillin and ceftriaxone 4/16 - last fever was on 4/18 - stopping abx today   Abnormal thyroid function tests TSH 0.551 with free T4 4.3 and free T3 0.8 - no known history of thyroid disease - most c/w sick euthyroid state - do not feel supplementation is indicated - recheck thyroid fxn after pt is recovered from her critical illness   Agitation/delirium Minimize sedation as able and follow   AKI on CKD stage III crt steadily improving and appears to be at her baseline   Accelerated hypertension Bp variable - follow w/o change in tx plan for today   Paroxysmal atrial fibrillation NSR at this time - continue Eliquis  Macrocytic anemia no evidence of overt bleeding - Ferritin 467 - Folate 19.4 - B12 2058 - recheck Hgb in AM as there was a near 2.0G drop over last 24hrs w/ no clinical signs to suggest this is true)  DM-2/Hypoglycemia A1c 7.3 - steroids stopped 4/22 - CBG climbing off lantus - may require adjustment soon - monitor next 24hrs   DVT prophylaxis: Eliquis  Code Status: FULL CODE Family Communication: no family present at time of exam   Disposition Plan: will need SNF per PT/OT   Consultants:  PCCM  Antimicrobials:  ampicillin 4/16 > Rocephin 4/16 >   Objective: Blood pressure (!) 149/58, pulse 93, temperature 99 F (37.2 C), temperature source Oral, resp. rate 18, height 5\' 3"  (1.6 m), weight 75.1 kg, SpO2 96 %.  Intake/Output Summary (Last 24 hours) at 10/03/2018 1029 Last data filed at 10/03/2018 0700 Gross per 24 hour  Intake 2061.67 ml  Output 1225 ml  Net 836.67 ml   Filed Weights   09/22/18 0500 09/23/18 0516  Weight: 76.8 kg 75.1 kg    Examination: General: No acute respiratory distress Lungs: fine crackles scattered th/o  Cardiovascular: Regular rate and rhythm without murmur gallop or rub  Abdomen: Nontender, nondistended, soft, bowel sounds positive, no rebound, no ascites, no appreciable mass Extremities: trace edema B   CBC: Recent Labs  Lab 10/01/18 0235 10/02/18 0315 10/03/18 0205  WBC 16.0* 23.7* 15.4*  NEUTROABS  --   --  11.2*  HGB 9.3* 10.1* 8.2*  HCT 30.7* 34.2* 28.2*  MCV 101.0* 100.9* 102.9*  PLT 605* 702* 902*   Basic Metabolic Panel: Recent Labs  Lab 09/28/18 0235  10/01/18 0235 10/02/18 0315 10/03/18 0205  NA 143   < > 146* 148* 145  K 5.2*   < > 4.2 3.8 4.0  CL 101   < > 108 112* 109  CO2 32   < > 30 26 28   GLUCOSE 272*   < > 84 112* 163*  BUN 112*   < >  71* 85* 74*  CREATININE 1.72*   < > 1.14* 1.70* 1.55*  CALCIUM 8.3*   < > 8.1* 8.1* 8.1*  MG 2.4  --   --   --   --   PHOS 5.5*  --   --   --   --    < > = values in this interval not displayed.   GFR: Estimated Creatinine Clearance: 29.5 mL/min (A) (by C-G formula based on SCr of 1.55 mg/dL (H)).  Liver Function Tests: Recent Labs  Lab 10/03/18 0205  AST 24  ALT 22  ALKPHOS 41  BILITOT 0.4  PROT 5.2*  ALBUMIN 2.0*    Coagulation Profile: Recent Labs  Lab 09/26/18 1200  INR 1.6*    HbA1C: Hgb A1c MFr Bld  Date/Time Value Ref Range Status  04/19/2012 03:43 PM 7.3 (H) <5.7 % Final     Comment:    (NOTE)                                                                       According to the ADA Clinical Practice Recommendations for 2011, when HbA1c is used as a screening test:  >=6.5%   Diagnostic of Diabetes Mellitus           (if abnormal result is confirmed) 5.7-6.4%   Increased risk of developing Diabetes Mellitus References:Diagnosis and Classification of Diabetes Mellitus,Diabetes Care,2011,34(Suppl 1):S62-S69 and Standards of Medical Care in         Diabetes - 2011,Diabetes ZCHY,8502,77 (Suppl 1):S11-S61.  04/18/2012 09:20 AM 7.3 (H) <5.7 % Final    Comment:    (NOTE)                                                                       According to the ADA Clinical Practice Recommendations for 2011, when HbA1c is used as a screening test:  >=6.5%   Diagnostic of Diabetes Mellitus           (if abnormal result is confirmed) 5.7-6.4%   Increased risk of developing Diabetes Mellitus References:Diagnosis and Classification of Diabetes Mellitus,Diabetes AJOI,7867,67(MCNOB 1):S62-S69 and Standards of Medical Care in         Diabetes - 2011,Diabetes Care,2011,34 (Suppl 1):S11-S61.    CBG: Recent Labs  Lab 10/02/18 1102 10/02/18 1615 10/02/18 2355 10/03/18 0347 10/03/18 0753  GLUCAP 98 145* 178* 122* 125*    Recent Results (from the past 240 hour(s))  Culture, blood (routine x 2)     Status: None   Collection Time: 09/25/18  8:05 AM  Result Value Ref Range Status   Specimen Description   Final    BLOOD LEFT ANTECUBITAL Performed at [facility] [address], [place], [place] [address]    Special Requests   Final    BOTTLES DRAWN AEROBIC AND ANAEROBIC Blood Culture adequate volume Performed at [facility] [address] [place], [place] [address]    Culture   Final    NO GROWTH  5 DAYS Performed at [facility], [place] [address] [place], [place] [address]    Report Status 09/30/2018 FINAL  Final   Culture, blood (routine x 2)     Status: None   Collection Time: 09/25/18  8:17 AM  Result Value Ref Range Status   Specimen Description   Final    BLOOD LEFT ANTECUBITAL Performed at [facility] [address] [place], [place] [address]    Special Requests   Final    BOTTLES DRAWN AEROBIC AND ANAEROBIC Blood Culture adequate volume Performed at [facility] [address] [place], [place] [address]    Culture   Final    NO GROWTH 5 DAYS Performed at [facility], [place] [address], [place], [place] [address]    Report Status 09/30/2018 FINAL  Final  Culture, Urine     Status: Abnormal   Collection Time: 09/25/18  9:03 AM  Result Value Ref Range Status   Specimen Description   Final    URINE, RANDOM Performed at [facility] [address], [place], [place] [address]    Special Requests   Final    NONE Performed at [facility], [place] [address], [place], [place] [address]    Culture (A)  Final    >=100,000 COLONIES/mL CITROBACTER FREUNDII >=100,000 COLONIES/mL ENTEROCOCCUS FAECALIS    Report Status 09/27/2018 FINAL  Final   Organism ID, Bacteria CITROBACTER FREUNDII (A)  Final   Organism ID, Bacteria ENTEROCOCCUS FAECALIS (A)  Final      Susceptibility   Citrobacter freundii - MIC*    CEFAZOLIN >=64 RESISTANT Resistant     CEFTRIAXONE <=1 SENSITIVE Sensitive     CIPROFLOXACIN <=0.25 SENSITIVE Sensitive     GENTAMICIN <=1 SENSITIVE Sensitive     IMIPENEM 0.5 SENSITIVE Sensitive     NITROFURANTOIN 32 SENSITIVE Sensitive     TRIMETH/SULFA <=20 SENSITIVE Sensitive     PIP/TAZO <=4 SENSITIVE Sensitive     * >=100,000 COLONIES/mL CITROBACTER FREUNDII   Enterococcus faecalis - MIC*    AMPICILLIN <=2 SENSITIVE Sensitive     LEVOFLOXACIN 2 SENSITIVE Sensitive     NITROFURANTOIN <=16 SENSITIVE Sensitive     VANCOMYCIN 2 SENSITIVE Sensitive     * >=100,000 COLONIES/mL ENTEROCOCCUS FAECALIS   Culture, respiratory (non-expectorated)     Status: None   Collection Time: 09/28/18 11:06 AM  Result Value Ref Range Status   Specimen Description   Final    TRACHEAL ASPIRATE Performed at [facility] [address] [place], [place] [address]    Special Requests   Final    Normal Performed at [facility], [place] [address], [place], [place] [address]    Gram Stain   Final    RARE WBC PRESENT, PREDOMINANTLY PMN RARE GRAM POSITIVE COCCI RARE GRAM POSITIVE RODS    Culture   Final    FEW Consistent with normal respiratory flora. Performed at [facility], [place] [address] [place], [place] [address]    Report Status 10/01/2018 FINAL  Final  SARS Coronavirus 2 [facility] order, Performed in [facility] hospital lab)     Status: Abnormal   Collection Time: 09/28/18 11:18 AM  Result Value Ref Range Status   SARS Coronavirus 2 POSITIVE (A) NEGATIVE Corrected    Comment: RESULT CALLED TO, READ BACK BY AND VERIFIED WITH: TETREAULT, H RN @1945  ON 09/28/2018 JACKSON,K Performed at [facility], [place]  [address], [place], [place] [address] CORRECTED ON 04/18 AT 1954: PREVIOUSLY REPORTED AS POSITIVE CRITICAL RESULT CALLED TO, READ BACK BY AND VERIFIED WITH: TETREAULT, H RN @1945  ON 09/28/2018 JACKSON,K      Scheduled Meds: . apixaban  5 mg Oral BID  . carvedilol  6.25 mg Oral BID WC  . chlorhexidine  15 mL Mouth/Throat BID  . Chlorhexidine Gluconate Cloth  6 each Topical Daily  . gabapentin  100 mg Oral QHS  . insulin aspart  0-15 Units Subcutaneous Q4H  . levothyroxine  25 mcg Oral QAC breakfast  . pantoprazole  40 mg Oral Daily  . polyethylene glycol  17 g Oral Daily  . QUEtiapine  50 mg Oral BID  . sodium chloride flush  10-40 mL Intracatheter Q12H   Continuous Infusions: . sodium chloride Stopped (10/02/18 1119)  . ampicillin (OMNIPEN) IV 300 mL/hr at 10/03/18 0700  . cefTRIAXone (ROCEPHIN)  IV 2 g (10/03/18 0945)  .  dextrose Stopped (10/03/18 6045)     LOS: 24 days   Cherene Altes, MD [facility] Office  [phone] Pager - Text Page per Amion  If 7PM-7AM, please contact night-coverage per Amion 10/03/2018, 10:29 AM

## 2018-10-03 NOTE — Progress Notes (Signed)
Phoned Pt's daughter, Sabrina Baker x2 on her mobile number, and no answer.  Left message that I had called to update her. Will attempt to call again, if possible.

## 2018-10-03 NOTE — Progress Notes (Signed)
Physical Therapy Treatment Patient Details Name: Sabrina Baker MRN: [id number] DOB: 04-09-1941 Today's Date: 10/03/2018    History of Present Illness 78 y.o. female with PMHx of HTN, DM, CKD stage III, presented with shortness of breath and cough, found to have acute hypoxic respiratory failure due to ARDS in the setting of COVID-19 infection. ETT 3/30-4/15, stridor and vocal fold edema; reintubated 4/15-4/20.      PT Comments    The patient was not as alert initially as previous encounter. Did participate in sittin at bed edge, followed directions with increased arousal. Continue PT.   Follow Up Recommendations  SNF     Equipment Recommendations  None recommended by PT    Recommendations for Other Services       Precautions / Restrictions Precautions Precautions: Fall Precaution Comments: monotor VS, incontinent BM(check)    Mobility  Bed Mobility Overal bed mobility: Needs Assistance Bed Mobility: Rolling;Supine to Sit;Sit to Supine Rolling: Mod assist;Max assist;+2 for physical assistance;+2 for safety/equipment   Supine to sit: Max assist;+2 for physical assistance;+2 for safety/equipment Sit to supine: Max assist;+2 for physical assistance;+2 for safety/equipment   General bed mobility comments: rorequired increased assistance for  mobility today. keeps eyes closed. Saturation 88-93%, HR in 90's  Transfers                    Ambulation/Gait                 Stairs             Wheelchair Mobility    Modified Rankin (Stroke Patients Only)       Balance Overall balance assessment: Needs assistance Sitting-balance support: Bilateral upper extremity supported;Feet supported Sitting balance-Leahy Scale: Poor Sitting balance - Comments: intermittently supports self in midline. Sat bed edge x 5 minutes. tendency to posterior lean. frequent  cues to self adjust postition.                                    Cognition  Arousal/Alertness: Lethargic   Overall Cognitive Status: No family/caregiver present to determine baseline cognitive functioning Area of Impairment: Following commands;Attention;Memory;Safety/judgement;Awareness;Problem solving                   Current Attention Level: Selective   Following Commands: Follows one step commands inconsistently;Follows one step commands with increased time       General Comments: pt. oriented to [place], patient not as alert as previous encounter.      Exercises      General Comments        Pertinent Vitals/Pain Pain Assessment: Faces Faces Pain Scale: Hurts little more Pain Location: when moves hand. arms Pain Descriptors / Indicators: Constant;Discomfort;Grimacing Pain Intervention(s): Monitored during session    Home Living                      Prior Function            PT Goals (current goals can now be found in the care plan section) Progress towards PT goals: Progressing toward goals    Frequency    Min 3X/week      PT Plan Current plan remains appropriate;Frequency needs to be updated    Co-evaluation PT/OT/SLP Co-Evaluation/Treatment: Yes Reason for Co-Treatment: For patient/therapist safety PT goals addressed during session: Mobility/safety with mobility OT goals addressed during session: ADL's and self-care  AM-PAC PT "6 Clicks" Mobility   Outcome Measure  Help needed turning from your back to your side while in a flat bed without using bedrails?: Total Help needed moving from lying on your back to sitting on the side of a flat bed without using bedrails?: Total Help needed moving to and from a bed to a chair (including a wheelchair)?: Total Help needed standing up from a chair using your arms (e.g., wheelchair or bedside chair)?: Total Help needed to walk in hospital room?: Total Help needed climbing 3-5 steps with a railing? : Total 6 Click Score: 6    End of Session   Activity  Tolerance: Patient tolerated treatment well Patient left: in bed;with call bell/phone within reach;with nursing/sitter in room Nurse Communication: Mobility status PT Visit Diagnosis: Muscle weakness (generalized) (M62.81);Difficulty in walking, not elsewhere classified (R26.2)     Time: 7672-0947 PT Time Calculation (min) (ACUTE ONLY): 36 min  Charges:    Therapeutic activity [place] Pager [phone] Office [phone]    Claretha Cooper 10/03/2018, 5:20 PM

## 2018-10-04 ENCOUNTER — Inpatient Hospital Stay (HOSPITAL_COMMUNITY): Payer: Medicare Other

## 2018-10-04 DIAGNOSIS — R509 Fever, unspecified: Secondary | ICD-10-CM

## 2018-10-04 LAB — GLUCOSE, CAPILLARY
Glucose-Capillary: 199 mg/dL — ABNORMAL HIGH (ref 70–99)
Glucose-Capillary: 121 mg/dL — ABNORMAL HIGH (ref 70–99)
Glucose-Capillary: 135 mg/dL — ABNORMAL HIGH (ref 70–99)
Glucose-Capillary: 148 mg/dL — ABNORMAL HIGH (ref 70–99)
Glucose-Capillary: 321 mg/dL — ABNORMAL HIGH (ref 70–99)
Glucose-Capillary: 192 mg/dL — ABNORMAL HIGH (ref 70–99)

## 2018-10-04 LAB — URINALYSIS, ROUTINE W REFLEX MICROSCOPIC
Bilirubin Urine: NEGATIVE
Glucose, UA: NEGATIVE mg/dL
Hgb urine dipstick: NEGATIVE
Ketones, ur: NEGATIVE mg/dL
Leukocytes,Ua: NEGATIVE
Nitrite: NEGATIVE
Protein, ur: NEGATIVE mg/dL
Specific Gravity, Urine: 1.016 (ref 1.005–1.030)
pH: 5 (ref 5.0–8.0)

## 2018-10-04 LAB — BASIC METABOLIC PANEL
Anion gap: 3 — ABNORMAL LOW (ref 5–15)
BUN: 88 mg/dL — ABNORMAL HIGH (ref 8–23)
CO2: 26 mmol/L (ref 22–32)
Calcium: 9.3 mg/dL (ref 8.9–10.3)
Chloride: 117 mmol/L — ABNORMAL HIGH (ref 98–111)
Creatinine, Ser: 1.05 mg/dL — ABNORMAL HIGH (ref 0.44–1.00)
GFR calc Af Amer: 59 mL/min — ABNORMAL LOW (ref >60)
GFR calc non Af Amer: 51 mL/min — ABNORMAL LOW (ref >60)
Glucose, Bld: 196 mg/dL — ABNORMAL HIGH (ref 70–99)
Potassium: 4.3 mmol/L (ref 3.5–5.1)
Sodium: 146 mmol/L — ABNORMAL HIGH (ref 135–145)

## 2018-10-04 LAB — PROCALCITONIN: Procalcitonin: 0.27 ng/mL

## 2018-10-04 MED ORDER — GUAIFENESIN ER 600 MG PO TB12
1200.0000 mg | ORAL_TABLET | Freq: Two times a day (BID) | ORAL | Status: DC
  Administered 2018-10-04 – 2018-10-11 (×15): 1200 mg via ORAL
  Filled 2018-10-04 (×15): qty 2

## 2018-10-04 MED ORDER — COLCHICINE 0.6 MG PO TABS
0.6000 mg | ORAL_TABLET | Freq: Four times a day (QID) | ORAL | Status: DC
  Filled 2018-10-04: qty 1

## 2018-10-04 MED ORDER — QUETIAPINE FUMARATE 25 MG PO TABS
25.0000 mg | ORAL_TABLET | Freq: Two times a day (BID) | ORAL | Status: DC
  Administered 2018-10-04: 25 mg via ORAL
  Filled 2018-10-04 (×2): qty 1

## 2018-10-04 MED ORDER — DEXTROSE 50 % IV SOLN
INTRAVENOUS | Status: AC
  Administered 2018-10-04
  Filled 2018-10-04: qty 50

## 2018-10-04 MED ORDER — COLCHICINE 0.6 MG PO TABS
0.6000 mg | ORAL_TABLET | Freq: Four times a day (QID) | ORAL | Status: AC
  Administered 2018-10-04 (×2): 0.6 mg via ORAL
  Filled 2018-10-04 (×2): qty 1

## 2018-10-04 MED ORDER — COLCHICINE 0.6 MG PO TABS
0.6000 mg | ORAL_TABLET | Freq: Every day | ORAL | Status: AC
  Administered 2018-10-05 – 2018-10-09 (×5): 0.6 mg via ORAL
  Filled 2018-10-04 (×5): qty 1

## 2018-10-04 NOTE — Progress Notes (Signed)
OT Treatment Note  Attempted to transfer pt OOB with Stedy. Unable to safely complete due to weakness. Will need Maximove. Completed Strengthening at bed level.     10/04/18 1500  OT Visit Information  Last OT Received On 10/04/18  Assistance Needed +2  PT/OT/SLP Co-Evaluation/Treatment Yes  Reason for Co-Treatment Complexity of the patient's impairments (multi-system involvement);Necessary to address cognition/behavior during functional activity;For patient/therapist safety  OT goals addressed during session ADL's and self-care;Strengthening/ROM;Other (comment) (mobility)  History of Present Illness 78 y.o. female with PMHx of HTN, DM, CKD stage III, presented with shortness of breath and cough, found to have acute hypoxic respiratory failure due to ARDS in the setting of COVID-19 infection. ETT 3/30-4/15, stridor and vocal fold edema; reintubated 4/15-4/20.    Precautions  Precautions Fall  Precaution Comments monitor VS  Pain Assessment  Pain Assessment Faces  Faces Pain Scale 4  Pain Location when moves hand. arms  Pain Descriptors / Indicators Grimacing;Guarding;Discomfort  Pain Intervention(s) Limited activity within patient's tolerance  Cognition  Arousal/Alertness Lethargic  Behavior During Therapy Restless;Flat affect  Overall Cognitive Status Impaired/Different from baseline  Area of Impairment Following commands;Attention;Memory;Safety/judgement;Awareness;Problem solving  Current Attention Level Sustained  Memory Decreased short-term memory  Following Commands Follows one step commands inconsistently;Follows one step commands with increased time  Safety/Judgement Decreased awareness of safety;Decreased awareness of deficits  Awareness Emergent  Problem Solving Slow processing;Decreased initiation;Requires verbal cues;Requires tactile cues;Difficulty sequencing  General Comments pt keeping eyes closed most of session, requring near constant cues to open eyes  Upper  Extremity Assessment  Upper Extremity Assessment Generalized weakness;RUE deficits/detail;LUE deficits/detail  RUE Deficits / Details more painful than LUE; red edematous thumb - pain ful to touch; limited hand ROM; Able to make groos composte grasp fater ROM with exception of thumb; LR shoulder appears painful. Feeding self with non-doinanat L hand; completing self ROM with @ 80 degreees FF  RUE Coordination decreased fine motor;decreased gross motor  LUE Deficits / Details Able to reach mouth with built up spoon with min A; poor in-hand manipulation skills; requires A to pick up utensil; unable to load utensil  LUE Coordination decreased fine motor;decreased gross motor  Lower Extremity Assessment  Lower Extremity Assessment Defer to PT evaluation  ADL  Overall ADL's  Needs assistance/impaired  Eating/Feeding Maximal assistance;Sitting;With adaptive utensils  Eating/Feeding Details (indicate cue type and reason) modified diet  Grooming Sitting;Maximal assistance  Upper Body Bathing Maximal assistance;Bed level;Sitting  Lower Body Bathing Maximal assistance;Bed level  Upper Body Dressing  Total assistance;Sitting  Lower Body Dressing Total assistance;Bed level  Functional mobility during ADLs Maximal assistance;+2 for physical assistance  Bed Mobility  Overal bed mobility Needs Assistance  Bed Mobility Rolling;Supine to Sit;Sit to Supine  Rolling Mod assist;Max assist;+2 for physical assistance;+2 for safety/equipment  Supine to sit Mod assist;Max assist;+2 for physical assistance;+2 for safety/equipment  Sit to supine Max assist;+2 for physical assistance;+2 for safety/equipment  General bed mobility comments pt keeping eyes closed, requiring multi-modal cues for sequencing, participation, direction to task  Balance  Sitting-balance support Bilateral upper extremity supported;Feet supported  Sitting balance-Leahy Scale Poor  Sitting balance - Comments intermittently supports self in  midline.  tendency to posterior lean. frequent multi-modal  cues to self adjust postition.  Restrictions  Weight Bearing Restrictions No  Vision- Assessment  Additional Comments wears glasses "at times"; pt does "not need glasses" at this time  Transfers  Overall transfer level Needs assistance  General transfer comment attempted with stedy, deferred d/t safety issues/weakness  General Exercises - Upper Extremity  Shoulder Flexion Strengthening;Both;15 reps;Supine  Shoulder ABduction Both;10 reps;Supine  Elbow Flexion Both;15 reps;Seated  Elbow Extension Both;15 reps;Seated  Digit Composite Flexion Strengthening;15 reps;Squeeze ball  Composite Extension AROM;Both;10 reps  General Exercises - Lower Extremity  Hip ABduction/ADduction  (isometric )  Other Exercises  Other Exercises rolling R-L  OT - End of Session  Equipment Utilized During Treatment Oxygen  Activity Tolerance Patient limited by fatigue  Patient left in bed;with call bell/phone within reach;with bed alarm set  Nurse Communication Mobility status;Need for lift equipment  OT Assessment/Plan  OT Plan Discharge plan remains appropriate  OT Visit Diagnosis Unsteadiness on feet (R26.81);Other abnormalities of gait and mobility (R26.89);Muscle weakness (generalized) (M62.81);Other symptoms and signs involving cognitive function;Pain  Pain - Right/Left Right  Pain - part of body Arm;Hand  OT Frequency (ACUTE ONLY) Min 3X/week  Recommendations for Other Services PT consult;Speech consult  Follow Up Recommendations SNF;Supervision/Assistance - 24 hour  OT Equipment Other (comment)  AM-PAC OT "6 Clicks" Daily Activity Outcome Measure (Version 2)  Help from another person eating meals? 2  Help from another person taking care of personal grooming? 2  Help from another person toileting, which includes using toliet, bedpan, or urinal? 2  Help from another person bathing (including washing, rinsing, drying)? 2  Help from another  person to put on and taking off regular upper body clothing? 1  Help from another person to put on and taking off regular lower body clothing? 1  6 Click Score 10  OT Goal Progression  Progress towards OT goals Progressing toward goals  Acute Rehab OT Goals  Patient Stated Goal to get stronger  OT Goal Formulation With patient/family  Time For Goal Achievement 10/15/18  Potential to Achieve Goals Good  ADL Goals  Pt Will Perform Grooming with set-up;with supervision;sitting  Pt Will Perform Lower Body Dressing with mod assist;sit to/from stand;sitting/lateral leans  Pt Will Transfer to Toilet with mod assist;stand pivot transfer;bedside commode  Pt Will Perform Toileting - Clothing Manipulation and hygiene with mod assist;sit to/from stand;sitting/lateral leans  Additional ADL Goal #1 Pt will perform bed mobility with Min A in preparation for ADLs  Additional ADL Goal #2 Pt willl sustain attention to ADL with Min cues  OT Time Calculation  OT Start Time (ACUTE ONLY) 1119  OT Stop Time (ACUTE ONLY) 1215  OT Time Calculation (min) 56 min  OT General Charges  $OT Visit 1 Visit  OT Treatments  $Self Care/Home Management  23-37 mins  Maurie Boettcher, OT/L   Acute OT Clinical Specialist [place] Pager [phone] Office [phone]

## 2018-10-04 NOTE — Progress Notes (Signed)
  Speech Language Pathology Treatment: Dysphagia  Patient Details Name: ADILEN PAVELKO MRN: [id number] DOB: 07/24/1940 Today's Date: 10/04/2018 Time: 0272-5366 SLP Time Calculation (min) (ACUTE ONLY): 15 min  Assessment / Plan / Recommendation Clinical Impression  Note that pt was febrile earlier today. CXR report reviewed and discussed with MD, who does not believe imaging is concerning for aspiration. Instrumental testing is not available at the moment in light of current pandemic. Documented breath sounds are diminished but clear, and two nurses (her primary and another floating RN who assisted with PO intake this afternoon) both report no overt signs of difficulty. Pt overall appears to swallow swiftly given nectar thick liquids and purees, with Mod faded to Min cues for labial seal around a cup. As trials progressed, pt began to have more eructation, and that point she had two instances of a single cough. Suspect possible esophageal component, particularly in light of pt reported h/o GER. Given fevers today, no advanced trials were offered, but in light of her overall clinical picture (and mildly improved vocal quality today), would continue current diet and precautions with close monitoring.   HPI HPI: Patient is a 78 y.o. female with PMHx of HTN, DM, CKD stage III, presented with shortness of breath and cough, found to have acute hypoxic respiratory failure due to ARDS in the setting of COVID-19 infection. ETT 3/30-4/15, stridor and vocal fold edema; reintubated 4/15-4/20.       SLP Plan  Continue with current plan of care       Recommendations  Diet recommendations: Dysphagia 1 (puree);Nectar-thick liquid Liquids provided via: Straw;Teaspoon Medication Administration: Whole meds with puree Supervision: Staff to assist with self feeding;Full supervision/cueing for compensatory strategies Compensations: Minimize environmental distractions;Slow rate;Small sips/bites Postural Changes  and/or Swallow Maneuvers: Seated upright 90 degrees;Upright 30-60 min after meal                Oral Care Recommendations: Oral care BID Follow up Recommendations: Skilled Nursing facility SLP Visit Diagnosis: Dysphagia, unspecified (R13.10) Plan: Continue with current plan of care       GO                Venita Sheffield Tamario Heal 10/04/2018, 4:36 PM   Pollyann Glen, M.A. [place] Acute Environmental education officer [phone] Office [phone]

## 2018-10-04 NOTE — Progress Notes (Signed)
OT Treatment Note  Second session to focus on self feeding. Began using red tubing on utensils. Will need adapted cup. Spoke with daughter over the phone regarding progress. Will discuss apparent pain in hands/? Gout flare with MD/nsg. Will continue ot follow acutely.    10/04/18 1519  OT Visit Information  Last OT Received On 10/04/18  Assistance Needed +2  History of Present Illness 78 y.o. female with PMHx of HTN, DM, CKD stage III, presented with shortness of breath and cough, found to have acute hypoxic respiratory failure due to ARDS in the setting of COVID-19 infection. ETT 3/30-4/15, stridor and vocal fold edema; reintubated 4/15-4/20.    Precautions  Precautions Fall  Precaution Comments monitor VS  Pain Assessment  Pain Assessment Faces  Faces Pain Scale 4  Pain Location when moves hand. arms  Pain Descriptors / Indicators Grimacing;Guarding;Discomfort  Pain Intervention(s) Limited activity within patient's tolerance  Cognition  Arousal/Alertness Lethargic  Behavior During Therapy Restless;Flat affect  Overall Cognitive Status Impaired/Different from baseline  Area of Impairment Following commands;Attention;Memory;Safety/judgement;Awareness;Problem solving  Current Attention Level Sustained  Memory Decreased short-term memory  Following Commands Follows one step commands inconsistently;Follows one step commands with increased time  Safety/Judgement Decreased awareness of safety;Decreased awareness of deficits  Awareness Emergent  Problem Solving Slow processing;Decreased initiation;Requires verbal cues;Requires tactile cues;Difficulty sequencing  General Comments Pt more alert in seated position however eyes mostly closed  ADL  Overall ADL's  Needs assistance/impaired  Eating/Feeding Maximal assistance  Eating/Feeding Details (indicate cue type and reason) Began self feeding with use of red tubing for utensil; requires A to load utensil and place/correctly orient  in  handPt able to bring utensil to mouth 50% of time. A to control cup/drink and amount of sip (nectar).  Grooming Maximal assistance  Grooming Details (indicate cue type and reason) Able to wipe mouth after eating. Difficulty picking up cloth  OT - End of Session  Equipment Utilized During Treatment Oxygen  Activity Tolerance Patient tolerated treatment well  Patient left in bed;with call bell/phone within reach;Other (comment) (baby alarm set up)  Nurse Communication Need for lift equipment;Mobility status;Other (comment) (Need to assist wtih feeding)  OT Assessment/Plan  OT Plan Discharge plan remains appropriate  OT Visit Diagnosis Unsteadiness on feet (R26.81);Other abnormalities of gait and mobility (R26.89);Muscle weakness (generalized) (M62.81);Other symptoms and signs involving cognitive function;Pain  Pain - Right/Left Right  Pain - part of body Arm;Hand  OT Frequency (ACUTE ONLY) Min 3X/week  Recommendations for Other Services PT consult;Speech consult  Follow Up Recommendations SNF;Supervision/Assistance - 24 hour  OT Equipment Other (comment)  AM-PAC OT "6 Clicks" Daily Activity Outcome Measure (Version 2)  Help from another person eating meals? 2  Help from another person taking care of personal grooming? 2  Help from another person toileting, which includes using toliet, bedpan, or urinal? 2  Help from another person bathing (including washing, rinsing, drying)? 2  Help from another person to put on and taking off regular upper body clothing? 1  Help from another person to put on and taking off regular lower body clothing? 1  6 Click Score 10  OT Goal Progression  Progress towards OT goals Progressing toward goals  Acute Rehab OT Goals  Patient Stated Goal to get stronger  OT Goal Formulation With patient/family  Time For Goal Achievement 10/15/18  Potential to Achieve Goals Good  ADL Goals  Pt Will Perform Grooming with set-up;with supervision;sitting  Pt Will Perform  Lower Body Dressing with mod assist;sit to/from  stand;sitting/lateral leans  Pt Will Transfer to Toilet with mod assist;stand pivot transfer;bedside commode  Pt Will Perform Toileting - Clothing Manipulation and hygiene with mod assist;sit to/from stand;sitting/lateral leans  Additional ADL Goal #1 Pt will perform bed mobility with Min A in preparation for ADLs  Additional ADL Goal #2 Pt willl sustain attention to ADL with Min cues  OT Time Calculation  OT Start Time (ACUTE ONLY) 1230  OT Stop Time (ACUTE ONLY) 1255  OT Time Calculation (min) 25 min  OT General Charges  $OT Visit 1 Visit  OT Treatments  $Self Care/Home Management  23-37 mins  Maurie Boettcher, OT/L   Acute OT Clinical Specialist [place] Pager [phone] Office [phone]

## 2018-10-04 NOTE — Progress Notes (Signed)
PROGRESS NOTE                                                                                                                                                                                                             Patient Demographics:    Sabrina Baker, is a 78 y.o. female, DOB - [birth year], WIO:[id number]  Admit date - 09/09/2018   Admitting Physician Laurin Coder, MD  Outpatient Primary MD for the patient is [name], MD  LOS - 25   Chief Complaint  Patient presents with   Abdominal Pain   Cough       Brief Narrative    78 y.o.femalewith PMHx of HTN, DM, CKD stage III, presented with shortness of breath and cough, found to have acute hypoxic respiratory failure due to ARDS in the setting of COVID-19 infection.  3/30Admitted, intubated 3/21 R UE PICC 3/31>4/5 Plaquenil 4/13 transfer to [facility] 4/15 Extubated 4/15 Stridor-cord edema > reintubated 4/21 extubated    Subjective:    Sabrina Baker today reports some cough, generalized weakness, and poor appetite , right thumb swelling and erythema, resemble gout, fever 101.8 this morning .  Complains of right wrist/thumb pain.   Assessment  & Plan :    Active Problems:   Acute respiratory failure with hypoxia (HCC)   ARDS (adult respiratory distress syndrome) (HCC)   COVID-19   Acute Hypoxic Resp. Failure due to Covid 19 ARDS -It was initially extubated on 4/14, then she developed stridor  secondary to cord edema, requiring reintubation the same day, she was successfully extubated on 4/20. Remains on 2 L nasal cannula, encouraged to use incentive spirometry today, discussed with staff, will try out of bed to chair today -Remains with poor oral intake, continue to hold IV diuresis.   UTI -Urine culture growing both Citrobacter and enterococcus, she received total of 7 days of IV ampicillin and Rocephin.  Abnormal thyroid function tests TSH 0.551 with free T4 4.3 and free  T3 0.8 - no known history of thyroid disease - most c/w sick euthyroid state - do not feel supplementation is indicated - recheck thyroid fxn after pt is recovered from her critical illness   Fever -Patient is febrile 101.7 today, will repeat chest x-ray, obtain UA, and blood cultures. . Agitation/delirium Minimize sedation as able and follow   AKI on  CKD stage III crt steadily improving and appears to be at her baseline   Accelerated hypertension Bp variable - follow w/o change in tx plan for today   Paroxysmal atrial fibrillation NSR at this time - continue Eliquis  Macrocytic anemia no evidence of overt bleeding - Ferritin 467 - Folate 19.4 - B12 2058 - recheck Hgb in AM as there was a near 2.0G drop over last 24hrs w/ no clinical signs to suggest this is true)  DM-2/Hypoglycemia A1c 7.3 - steroids stopped 4/22 - CBG climbing off lantus - may require adjustment soon - monitor next 24hrs   Gout -Patient with right thumb swelling and redness, patient reports this resembles her gout flare, she will be started on colchicine  Hypernatremia -She was encouraged to increase oral intake today   Code Status : Full code  Family Communication  : D/W daughter via phone today.  Disposition Plan  : will need SNF when stable  Barriers For Discharge : remains hypoxic  Consults  :  PCCM  Procedures  :  3/30>> ETT 3/21>> RUE PICC 4/10>> A line-out 4/15>>extubated 4/15>>ETT (re-intubated) 4/20>> Extubated DVT Prophylaxis  :  On Eliquis  Lab Results  Component Value Date   PLT 535 (H) 10/03/2018    Antibiotics  :    Anti-infectives (From admission, onward)   Start     Dose/Rate Route Frequency Ordered Stop   09/27/18 1300  cefTRIAXone (ROCEPHIN) 2 g in sodium chloride 0.9 % 100 mL IVPB     2 g 200 mL/hr over 30 Minutes Intravenous Every 24 hours 09/27/18 0928 10/03/18 1015   09/26/18 1400  ampicillin (OMNIPEN) 1 g in sodium chloride 0.9 % 100 mL IVPB     1 g 300  mL/hr over 20 Minutes Intravenous Every 8 hours 09/26/18 1204 10/03/18 2128   09/26/18 1300  cefTRIAXone (ROCEPHIN) 1 g in sodium chloride 0.9 % 100 mL IVPB  Status:  Discontinued     1 g 200 mL/hr over 30 Minutes Intravenous Every 24 hours 09/26/18 1204 09/27/18 0928   09/11/18 2200  hydroxychloroquine (PLAQUENIL) tablet 200 mg  Status:  Discontinued     200 mg Oral 2 times daily 09/10/18 2309 09/10/18 2319   09/11/18 2200  hydroxychloroquine (PLAQUENIL) tablet 200 mg     200 mg Per Tube 2 times daily 09/10/18 2319 09/15/18 0851   09/10/18 2330  hydroxychloroquine (PLAQUENIL) tablet 400 mg     400 mg Per Tube 2 times daily 09/10/18 2319 09/11/18 0823   09/10/18 2315  hydroxychloroquine (PLAQUENIL) tablet 400 mg  Status:  Discontinued     400 mg Oral 2 times daily 09/10/18 2309 09/10/18 2319   09/10/18 1300  azithromycin (ZITHROMAX) 500 mg in sodium chloride 0.9 % 250 mL IVPB     500 mg 250 mL/hr over 60 Minutes Intravenous Every 24 hours 09/09/18 1342 09/15/18 1531   09/10/18 1200  cefTRIAXone (ROCEPHIN) 1 g in sodium chloride 0.9 % 100 mL IVPB  Status:  Discontinued     1 g 200 mL/hr over 30 Minutes Intravenous Every 24 hours 09/09/18 1342 09/14/18 1436   09/09/18 1200  cefTRIAXone (ROCEPHIN) 1 g in sodium chloride 0.9 % 100 mL IVPB     1 g 200 mL/hr over 30 Minutes Intravenous  Once 09/09/18 1148 09/09/18 1248   09/09/18 1200  azithromycin (ZITHROMAX) 500 mg in sodium chloride 0.9 % 250 mL IVPB     500 mg 250 mL/hr over 60 Minutes Intravenous  Once  09/09/18 1148 09/09/18 1351        Objective:   Vitals:   10/04/18 0800 10/04/18 0900 10/04/18 0905 10/04/18 1000  BP: (!) 170/99  (!) 170/81 (!) 185/158  Pulse: (!) 109 100 (!) 101 (!) 106  Resp: (!) 29 (!) 25 (!) 23 (!) 24  Temp:   (!) 102.3 F (39.1 C) (!) 101.8 F (38.8 C)  TempSrc:   Oral Oral  SpO2: (!) 87% 91% 91% 91%  Weight:      Height:        Wt Readings from Last 3 Encounters:  06/21/17 70.3 kg  01/24/16 67.1 kg   03/16/15 76.2 kg     Intake/Output Summary (Last 24 hours) at 10/04/2018 1044 Last data filed at 10/03/2018 2300 Gross per 24 hour  Intake 433.71 ml  Output 725 ml  Net -291.29 ml     Physical Exam  Awake Alert, in bed, laying in bed, extremely frail Symmetrical Chest wall movement, air entry bilaterally, no wheezing  RRR,No Gallops,Rubs or new Murmurs, No Parasternal Heave +ve B.Sounds, Abd Soft, No tenderness, No rebound - guarding or rigidity. No Cyanosis, Clubbing or edema, No new Rash or bruise      Data Review:    CBC Recent Labs  Lab 09/29/18 0310 09/30/18 0500 10/01/18 0235 10/02/18 0315 10/03/18 0205  WBC 11.0* 12.1* 16.0* 23.7* 15.4*  HGB 8.3* 8.9* 9.3* 10.1* 8.2*  HCT 28.0* 29.4* 30.7* 34.2* 28.2*  PLT 526* 557* 605* 702* 535*  MCV 101.1* 101.0* 101.0* 100.9* 102.9*  MCH 30.0 30.6 30.6 29.8 29.9  MCHC 29.6* 30.3 30.3 29.5* 29.1*  RDW 14.6 14.4 14.3 15.4 15.9*  LYMPHSABS  --   --   --   --  1.7  MONOABS  --   --   --   --  1.5*  EOSABS  --   --   --   --  0.9*  BASOSABS  --   --   --   --  0.0    Chemistries  Recent Labs  Lab 09/28/18 0235  09/30/18 0500 10/01/18 0235 10/02/18 0315 10/03/18 0205 10/04/18 0140  NA 143   < > 145 146* 148* 145 146*  K 5.2*   < > 4.2 4.2 3.8 4.0 4.3  CL 101   < > 107 108 112* 109 117*  CO2 32   < > 32 30 26 28 26   GLUCOSE 272*   < > 203* 84 112* 163* 196*  BUN 112*   < > 85* 71* 85* 74* 88*  CREATININE 1.72*   < > 1.24* 1.14* 1.70* 1.55* 1.05*  CALCIUM 8.3*   < > 8.2* 8.1* 8.1* 8.1* 9.3  MG 2.4  --   --   --   --   --   --   AST  --   --   --   --   --  24  --   ALT  --   --   --   --   --  22  --   ALKPHOS  --   --   --   --   --  41  --   BILITOT  --   --   --   --   --  0.4  --    < > = values in this interval not displayed.   ------------------------------------------------------------------------------------------------------------------ No results for input(s): CHOL, HDL, LDLCALC, TRIG, CHOLHDL,  LDLDIRECT in the last 72 hours.  Lab  Results  Component Value Date   HGBA1C 7.3 (H) 04/19/2012   ------------------------------------------------------------------------------------------------------------------ No results for input(s): TSH, T4TOTAL, T3FREE, THYROIDAB in the last 72 hours.  Invalid input(s): FREET3 ------------------------------------------------------------------------------------------------------------------ No results for input(s): VITAMINB12, FOLATE, FERRITIN, TIBC, IRON, RETICCTPCT in the last 72 hours.  Coagulation profile No results for input(s): INR, PROTIME in the last 168 hours.  No results for input(s): DDIMER in the last 72 hours.  Cardiac Enzymes No results for input(s): CKMB, TROPONINI, MYOGLOBIN in the last 168 hours.  Invalid input(s): CK ------------------------------------------------------------------------------------------------------------------    Component Value Date/Time   BNP 278.8 (H) 09/25/2018 0350    Inpatient Medications  Scheduled Meds:  apixaban  5 mg Oral BID   carvedilol  6.25 mg Oral BID WC   chlorhexidine  15 mL Mouth/Throat BID   Chlorhexidine Gluconate Cloth  6 each Topical Daily   gabapentin  100 mg Oral QHS   guaiFENesin  1,200 mg Oral BID   insulin aspart  0-15 Units Subcutaneous Q4H   pantoprazole  40 mg Oral Daily   polyethylene glycol  17 g Oral Daily   QUEtiapine  50 mg Oral BID   sodium chloride flush  10-40 mL Intracatheter Q12H   Continuous Infusions:  sodium chloride 10 mL/hr at 10/03/18 2300   PRN Meds:.sodium chloride, acetaminophen (TYLENOL) oral liquid 160 mg/5 mL, albuterol, haloperidol lactate, hydrALAZINE, lip balm, Resource ThickenUp Clear, sodium chloride flush  Micro Results Recent Results (from the past 240 hour(s))  Culture, blood (routine x 2)     Status: None   Collection Time: 09/25/18  8:05 AM  Result Value Ref Range Status   Specimen Description   Final    BLOOD LEFT  ANTECUBITAL Performed at [facility], [place] [address] [place], [place] [address]    Special Requests   Final    BOTTLES DRAWN AEROBIC AND ANAEROBIC Blood Culture adequate volume Performed at [facility] [address], [place], [place] [address]    Culture   Final    NO GROWTH 5 DAYS Performed at [facility], [place] [address] [place], [place] [address]    Report Status 09/30/2018 FINAL  Final  Culture, blood (routine x 2)     Status: None   Collection Time: 09/25/18  8:17 AM  Result Value Ref Range Status   Specimen Description   Final    BLOOD LEFT ANTECUBITAL Performed at [facility] [address] [place], [place] [address]    Special Requests   Final    BOTTLES DRAWN AEROBIC AND ANAEROBIC Blood Culture adequate volume Performed at [facility] [address], [place], [place] [address]    Culture   Final    NO GROWTH 5 DAYS Performed at [facility], [place] [address], [place], [place] [address]    Report Status 09/30/2018 FINAL  Final  Culture, Urine     Status: Abnormal   Collection Time: 09/25/18  9:03 AM  Result Value Ref Range Status   Specimen Description   Final    URINE, RANDOM Performed at [facility] [address], [place], [place] [address]    Special Requests   Final    NONE Performed at [facility], [place] [address], [place],  [address]    Culture (A)  Final    >=100,000 COLONIES/mL CITROBACTER FREUNDII >=100,000 COLONIES/mL ENTEROCOCCUS FAECALIS    Report Status 09/27/2018 FINAL  Final   Organism ID,  Bacteria CITROBACTER FREUNDII (A)  Final   Organism ID, Bacteria ENTEROCOCCUS FAECALIS (A)  Final      Susceptibility   Citrobacter freundii - MIC*    CEFAZOLIN >=64 RESISTANT Resistant     CEFTRIAXONE <=1 SENSITIVE Sensitive     CIPROFLOXACIN <=0.25 SENSITIVE Sensitive     GENTAMICIN <=1 SENSITIVE Sensitive      IMIPENEM 0.5 SENSITIVE Sensitive     NITROFURANTOIN 32 SENSITIVE Sensitive     TRIMETH/SULFA <=20 SENSITIVE Sensitive     PIP/TAZO <=4 SENSITIVE Sensitive     * >=100,000 COLONIES/mL CITROBACTER FREUNDII   Enterococcus faecalis - MIC*    AMPICILLIN <=2 SENSITIVE Sensitive     LEVOFLOXACIN 2 SENSITIVE Sensitive     NITROFURANTOIN <=16 SENSITIVE Sensitive     VANCOMYCIN 2 SENSITIVE Sensitive     * >=100,000 COLONIES/mL ENTEROCOCCUS FAECALIS  Culture, respiratory (non-expectorated)     Status: None   Collection Time: 09/28/18 11:06 AM  Result Value Ref Range Status   Specimen Description   Final    TRACHEAL ASPIRATE Performed at [facility] [address] [place], [place] [address]    Special Requests   Final    Normal Performed at [facility], [place] [address] [place], [place] [address]    Gram Stain   Final    RARE WBC PRESENT, PREDOMINANTLY PMN RARE GRAM POSITIVE COCCI RARE GRAM POSITIVE RODS    Culture   Final    FEW Consistent with normal respiratory flora. Performed at [facility], [place] [address] [place], [place] [address]    Report Status 10/01/2018 FINAL  Final  SARS Coronavirus 2 [facility] order, Performed in [place] hospital lab)     Status: Abnormal   Collection Time: 09/28/18 11:18 AM  Result Value Ref Range Status   SARS Coronavirus 2 POSITIVE (A) NEGATIVE Corrected    Comment: RESULT CALLED TO, READ BACK BY AND VERIFIED WITH: TETREAULT, H RN @1945  ON 09/28/2018 JACKSON,K Performed at [facility], [place] [address], [place],  [address] CORRECTED ON 04/18 AT 1954: PREVIOUSLY REPORTED AS POSITIVE CRITICAL RESULT CALLED TO, READ BACK BY AND VERIFIED WITH: Quentin Angst RN @1945  ON 09/28/2018 [facility]     Radiology Reports Dg Chest Port 1 View  Result Date: 09/29/2018 CLINICAL DATA:  Acute hypoxia due to COVID 19 Fever 101.7 yesterday EXAM: PORTABLE CHEST 1 VIEW COMPARISON:  09/26/2018  FINDINGS: Endotracheal tube, NG tube, PICC line unchanged. Stable cardiac silhouette. Low lung volumes. Some improvement in pulmonary parenchymal opacities. IMPRESSION: 1. Mild improvement pulmonary parenchymal opacities. 2. Persistent low lung volumes. 3. Stable support apparatus. Electronically Signed   By: Suzy Bouchard M.D.   On: 09/29/2018 06:30   Dg Chest Port 1 View  Result Date: 09/26/2018 CLINICAL DATA:  Shortness of breath. EXAM: PORTABLE CHEST 1 VIEW COMPARISON:  One-view chest x-ray 09/25/2018 FINDINGS: The heart size is exaggerate by low lung volumes. The endotracheal tube has been pulled back slightly, now terminating 2.5 cm above the carina. A right-sided PICC line is stable. The NG tube courses off the inferior border of the film. Bilateral patchy interstitial and airspace disease is again noted. There is less right middle lobe consolidation. No definite effusions are present. IMPRESSION: 1. Improved aeration of the right middle lobe. 2. Persistent patchy interstitial and airspace disease bilaterally is worrisome infection or edema. 3. Repositioning of endotracheal tube, now 2.5 cm above the carina. Electronically Signed   By: Wynetta Fines.D.  On: 09/26/2018 07:52   Dg Chest Port 1 View  Result Date: 09/25/2018 CLINICAL DATA:  78 year old female with a history of intubation EXAM: PORTABLE CHEST 1 VIEW COMPARISON:  09/24/2018 FINDINGS: Endotracheal tube now terminates at the carina. Tube may be withdrawn 6 cm-7 cm for better positioning. Right upper extremity PICC. Gastric tube terminates out of the field of view. Low lung volumes with similar appearance of reticulonodular opacity throughout with increasing airspace opacity in the right mid lung. Persistently low lung volumes.  No pneumothorax or pleural effusion. IMPRESSION: Endotracheal tube terminates at the carina and may be withdrawn 6 cm-7 cm for better positioning. Low lung volumes persist with mixed interstitial and  airspace opacities bilaterally, with increasing airspace opacity in the right mid lung. Unchanged right upper extremity PICC and gastric tube. Electronically Signed   By: Corrie Mckusick D.O.   On: 09/25/2018 19:11   Dg Chest Port 1 View  Result Date: 09/24/2018 CLINICAL DATA:  Short of breath EXAM: PORTABLE CHEST 1 VIEW COMPARISON:  09/23/2018 FINDINGS: Normal heart size. Endotracheal tube, NG tube, and right PICC are stable. Patchy airspace opacities throughout both lungs are not significantly changed. No pneumothorax. IMPRESSION: Stable patchy airspace disease throughout both lungs. Electronically Signed   By: Marybelle Killings M.D.   On: 09/24/2018 07:41   Dg Chest Port 1 View  Result Date: 09/23/2018 CLINICAL DATA:  Respiratory failure. EXAM: PORTABLE CHEST 1 VIEW COMPARISON:  One-view chest x-ray 09/21/2018 FINDINGS: The heart size is normal. Endotracheal tube is stable. NG tube courses off the inferior border the film. Right-sided PICC line is stable and in satisfactory position. Bilateral interstitial and airspace disease is similar the prior exam. There is improved aeration at the left base. Increased airspace opacities are noted in the right middle and upper lobes. IMPRESSION: 1. Bilateral interstitial and airspace disease concerning for infection. There is slight increase on the right and some improvement on the left. 2. Support apparatus is stable. Electronically Signed   By: San Morelle M.D.   On: 09/23/2018 07:41   Dg Chest Port 1 View  Result Date: 09/21/2018 CLINICAL DATA:  Respiratory failure. EXAM: PORTABLE CHEST 1 VIEW COMPARISON:  Radiograph of September 21, 2018. FINDINGS: Stable cardiomediastinal silhouette. Endotracheal and nasogastric tubes are in grossly good position. Right-sided PICC line is unchanged. No pneumothorax or significant pleural effusion is noted. Stable bilateral lung opacities are noted concerning for pneumonia or possibly edema. Bony thorax is unremarkable.  IMPRESSION: Stable support apparatus. Stable bilateral lung opacities as described above. Electronically Signed   By: Marijo Conception, M.D.   On: 09/21/2018 22:03   Dg Chest Port 1 View  Result Date: 09/21/2018 CLINICAL DATA:  Respiratory failure EXAM: PORTABLE CHEST 1 VIEW COMPARISON:  09/20/2018 FINDINGS: Endotracheal tube in good position. Right arm PICC tip in the proximal SVC unchanged. NG tube in the stomach. Mild improvement in bilateral airspace disease. Small left effusion. IMPRESSION: Endotracheal tube in good position Diffuse bilateral airspace disease with mild improvement. Electronically Signed   By: Franchot Gallo M.D.   On: 09/21/2018 08:10   Dg Chest Port 1 View  Result Date: 09/20/2018 CLINICAL DATA:  Respiratory failure.  Ventilator support. EXAM: PORTABLE CHEST 1 VIEW COMPARISON:  09/18/2018 FINDINGS: Endotracheal tube tip is 4 cm above the carina. Orogastric tube enters the abdomen. Bilateral patchy pulmonary infiltrates appear quite similar, with slightly more volume loss at the right lung base. No new finding otherwise. IMPRESSION: Persistent patchy bilateral pulmonary infiltrates. Slight worsening of  volume loss at the right lung base. Electronically Signed   By: Nelson Chimes M.D.   On: 09/20/2018 06:11   Dg Chest Port 1 View  Result Date: 09/18/2018 CLINICAL DATA:  ARDS, COVID+ Pt intubated on airborne and contact PPE: Gloves, gown, N95, goggles, bouffant. EXAM: PORTABLE CHEST - 1 VIEW COMPARISON:  09/16/2018 FINDINGS: Endotracheal tube, nasogastric tube, right arm PICC stable. Slight improvement in right lower lung airspace disease with patchy hazy infiltrates in the left mid and lower lung and right upper lung as before. Heart size and mediastinal contours are within normal limits. No effusion. No pneumothorax. Fixation hardware in the proximal right humerus. IMPRESSION: 1. Patchy bilateral airspace disease, with slight improvement in right lower lung opacities since prior study.  2. Support hardware stable in position. Electronically Signed   By: Lucrezia Europe M.D.   On: 09/18/2018 08:05   Dg Chest Port 1 View  Result Date: 09/16/2018 CLINICAL DATA:  Respiratory failure EXAM: PORTABLE CHEST 1 VIEW COMPARISON:  Yesterday FINDINGS: Endotracheal tube tip between the clavicular heads and carina. The orogastric tube at least reaches the stomach. Right-sided central line with tip at the SVC. No appreciable change in patchy bilateral interstitial and airspace opacity. Stable heart size. No evidence of air leak or pleural fluid. IMPRESSION: 1. Unchanged multifocal pneumonia. 2. Stable hardware positioning. Electronically Signed   By: Monte Fantasia M.D.   On: 09/16/2018 07:09   Dg Chest Port 1 View  Result Date: 09/15/2018 CLINICAL DATA:  Respiratory failure. Intubated patient. Follow-up exam. EXAM: PORTABLE CHEST 1 VIEW COMPARISON:  Prior studies, most recent 09/14/2018 FINDINGS: Cardiac silhouette is normal size.  No mediastinal or hilar masses. There are hazy bilateral airspace lung opacities which are without change from the previous day's exam. No new lung abnormalities. No convincing pleural effusion and no pneumothorax. Endotracheal tube, nasal/orogastric tube and right PICC are stable. IMPRESSION: 1. No change from previous day's study. 2. Persistent bilateral ground-glass lung opacities. 3. Stable well-positioned support apparatus. Electronically Signed   By: Lajean Manes M.D.   On: 09/15/2018 06:47   Dg Chest Port 1 View  Result Date: 09/14/2018 CLINICAL DATA:  Respiratory failure.  COVID-19. EXAM: PORTABLE CHEST 1 VIEW COMPARISON:  09/13/2018 and prior studies dating back to 09/09/2018. FINDINGS: Bilateral ground-glass lung opacities are without significant change from the prior exams. Atelectasis at the left lung base is stable. More confluent opacity at the right lung base is less prominent, which appears to be due to larger lung volumes on the current exam. No new lung  abnormalities.  No pleural effusion or pneumothorax. Endotracheal tube, nasal/orogastric tube and right PICC are stable in well positioned. IMPRESSION: 1. No significant change from the previous day's study. 2. Persistent bilateral ground-glass lung opacities more confluent basilar opacities. 3. Stable well-positioned support apparatus. Electronically Signed   By: Lajean Manes M.D.   On: 09/14/2018 06:24   Dg Chest Port 1 View  Result Date: 09/13/2018 CLINICAL DATA:  Respiratory failure EXAM: PORTABLE CHEST 1 VIEW COMPARISON:  Yesterday FINDINGS: Endotracheal tube tip between the clavicular heads and carina. The orogastric tube has been advanced and now at least reaches the stomach-as confirmed on interval KUB. Right PICC with tip at the SVC. Patchy bilateral airspace disease. Borderline heart size. No effusion or pneumothorax. IMPRESSION: Stable hardware positioning and bilateral pneumonia. Electronically Signed   By: Monte Fantasia M.D.   On: 09/13/2018 06:08   Dg Chest Port 1 View  Result Date: 09/12/2018 CLINICAL DATA:  History of endotracheal tube EXAM: PORTABLE CHEST 1 VIEW COMPARISON:  Yesterday FINDINGS: Endotracheal tube tip between the clavicular heads and carina. The orogastric tube tip is in the region of the GE junction. Right PICC with tip near the SVC origin. Patchy bilateral lung opacity with both interstitial and airspace appearance. No effusion or pneumothorax. Normal heart size. These results will be called to the ordering clinician or representative by the Radiologist Assistant, and communication documented in the PACS or zVision Dashboard. IMPRESSION: 1. Shortened orogastric tube with tip now at the GE junction. 2. Other hardware positioning is stable. 3. Bilateral lung opacity with progression. Electronically Signed   By: Monte Fantasia M.D.   On: 09/12/2018 07:14   Dg Chest Port 1 View  Result Date: 09/11/2018 CLINICAL DATA:  Respiratory failure EXAM: PORTABLE CHEST 1 VIEW  COMPARISON:  09/10/2018 FINDINGS: Endotracheal tube and NG tube are stable. Interval placement of right PICC line with the tip in the SVC. Patchy bilateral airspace disease similar to prior study. Heart is normal size. No visible effusions or acute bony abnormality. IMPRESSION: Right PICC line tip in the SVC. Remainder of the support devices stable. Stable patchy bilateral airspace disease. Electronically Signed   By: Rolm Baptise M.D.   On: 09/11/2018 07:36   Dg Chest Port 1 View  Result Date: 09/10/2018 CLINICAL DATA:  Respiratory failure EXAM: PORTABLE CHEST 1 VIEW COMPARISON:  09/09/2018 FINDINGS: Support devices are stable. Heart is normal size. Patchy bilateral airspace disease again noted, not significantly changed. No visible effusions or acute bony abnormality. IMPRESSION: Patchy bilateral airspace disease, not significantly changed. Electronically Signed   By: Rolm Baptise M.D.   On: 09/10/2018 07:39   Dg Chest Port 1 View  Result Date: 09/09/2018 CLINICAL DATA:  Respiratory failure. EXAM: PORTABLE CHEST 1 VIEW COMPARISON:  Chest x-ray from same day at 1 a.m. FINDINGS: Interval placement of an endotracheal tube with the tip 3.9 cm above the carina. Enteric tube entering the stomach with the tip below the field of view. The heart size and mediastinal contours are within normal limits. Patchy asymmetric, hazy opacities in both lungs again noted, slightly more consolidative appearance in the left mid lung and right lower lobe. No pleural effusion or pneumothorax. No acute osseous abnormality. IMPRESSION: 1. Appropriately positioned endotracheal tube. 2. Patchy asymmetric airspace disease in both lungs again noted, with slightly more focal consolidative appearance in the left mid lung and right lower lobe. Findings are nonspecific, but concerning for atypical infection, including potential viral pneumonia. Electronically Signed   By: Titus Dubin M.D.   On: 09/09/2018 17:38   Dg Chest Port 1  View  Result Date: 09/09/2018 CLINICAL DATA:  Short of breath, cough.  Diarrhea. EXAM: PORTABLE CHEST 1 VIEW COMPARISON:  Chest radiograph 05/29/2012 FINDINGS: Normal cardiac silhouette. There is bilateral fine airspace disease in the upper lobes. No pleural fluid. No pneumothorax. No acute osseous abnormality. RIGHT shoulder internal fixation. IMPRESSION: Bilateral upper lobe airspace disease representing multifocal pneumonia versus pulmonary edema. Electronically Signed   By: Suzy Bouchard M.D.   On: 09/09/2018 11:27   Dg Abd Portable 1v  Result Date: 09/25/2018 CLINICAL DATA:  78 y/o  F; intubation, NG placement. EXAM: PORTABLE ABDOMEN - 1 VIEW COMPARISON:  09/12/2018 abdomen radiographs. FINDINGS: Enteric tube tip projects over the gastric body. Normal bowel gas pattern. Ill-defined patchy opacities in lung bases. No acute osseous abnormality is evident. IMPRESSION: Enteric tube tip projects over the gastric body. Normal bowel gas pattern. Electronically  Signed   By: Kristine Garbe M.D.   On: 09/25/2018 19:19   Dg Abd Portable 1v  Result Date: 09/12/2018 CLINICAL DATA:  Orogastric tube placement. EXAM: PORTABLE ABDOMEN - 1 VIEW COMPARISON:  CT abdomen and pelvis June 24, 2018 FINDINGS: Nasogastric tube tip projects in gastric antrum. No intra-abdominal mass effect or pathologic calcifications. Included bowel gas pattern is nondilated and nonobstructive. Mild vascular calcifications. Interstitial and alveolar airspace opacities included lung bases better characterized on today's dedicated radiograph. Thoracolumbar levoscoliosis. IMPRESSION: 1. Nasogastric tube tip projects in gastric antrum. Electronically Signed   By: Elon Alas M.D.   On: 09/12/2018 22:13   [place] Ekg Site Rite  Result Date: 09/10/2018 If Site Rite image not attached, placement could not be confirmed due to current cardiac rhythm.   Phillips Climes M.D on 10/04/2018 at 10:44 AM  Between 7am to 7pm - Pager -  [phone]  After 7pm go to [email or web address] - password [facility]  [facility] -  Office  [phone]

## 2018-10-04 NOTE — Progress Notes (Signed)
Attempted to turn patient to prone position. Patient tolerated transition well but within 5 minutes began panicking and tossing saying "I can't lay like this!" Attempted to educate patient importance and purpose of positioning and attempted to position pillows and body for comfort but patient became extremely anxious. Turned patient into left sided Sim's position, patient immediately calmed, said "This is better, I can lay like this." but in less than 10 minutes patient was pushing against rails and kicking out trying to lay flat again. Patient positioned onto back with pillow under hip. Assisted patient to eat dinner, good po intake. Will cont to monitor.

## 2018-10-04 NOTE — Progress Notes (Signed)
Physical Therapy Treatment Patient Details Name: Sabrina Baker MRN: [id number] DOB: 02-05-1941 Today's Date: 10/04/2018    History of Present Illness 78 y.o. female with PMHx of HTN, DM, CKD stage III, presented with shortness of breath and cough, found to have acute hypoxic respiratory failure due to ARDS in the setting of COVID-19 infection. ETT 3/30-4/15, stridor and vocal fold edema; reintubated 4/15-4/20.      PT Comments     Pt lethargic/keeping eyes closed most of session, requiring constant cues  For participation; will continue to follow acutely; will attempt OOB next visist, likely will need to use maximive (no battery today for maximove)  Follow Up Recommendations  SNF     Equipment Recommendations  None recommended by PT    Recommendations for Other Services       Precautions / Restrictions Precautions Precautions: Fall Precaution Comments: monitor VS Restrictions Weight Bearing Restrictions: No    Mobility  Bed Mobility Overal bed mobility: Needs Assistance Bed Mobility: Rolling;Supine to Sit;Sit to Supine Rolling: Mod assist;Max assist;+2 for physical assistance;+2 for safety/equipment   Supine to sit: Mod assist;Max assist;+2 for physical assistance;+2 for safety/equipment Sit to supine: Max assist;+2 for physical assistance;+2 for safety/equipment   General bed mobility comments: pt keeping eyes closed, requiring multi-modal cues for sequencing, participation, direction to task  Transfers                 General transfer comment: attempted with stedy, deferred d/t safety issues/weakness  Ambulation/Gait                 Stairs             Wheelchair Mobility    Modified Rankin (Stroke Patients Only)       Balance   Sitting-balance support: Bilateral upper extremity supported;Feet supported Sitting balance-Leahy Scale: Poor Sitting balance - Comments: intermittently supports self in midline.  tendency to posterior lean.  frequent multi-modal  cues to self adjust postition.                                    Cognition Arousal/Alertness: Lethargic Behavior During Therapy: Restless;Flat affect Overall Cognitive Status: Impaired/Different from baseline Area of Impairment: Following commands;Attention;Memory;Safety/judgement;Awareness;Problem solving                     Memory: Decreased short-term memory Following Commands: Follows one step commands inconsistently;Follows one step commands with increased time Safety/Judgement: Decreased awareness of safety;Decreased awareness of deficits   Problem Solving: Slow processing;Decreased initiation;Requires verbal cues;Requires tactile cues;Difficulty sequencing General Comments: pt keeping eyes closed most of session, requring near constant cues to open eyes      Exercises General Exercises - Lower Extremity Heel Slides: AAROM;Both;10 reps Hip ABduction/ADduction: 10 reps;Both(isometric )    General Comments        Pertinent Vitals/Pain Pain Assessment: Faces Faces Pain Scale: Hurts little more Pain Location: when moves hand. arms Pain Descriptors / Indicators: Grimacing;Guarding;Discomfort Pain Intervention(s): Limited activity within patient's tolerance;Monitored during session;Repositioned    Home Living                      Prior Function            PT Goals (current goals can now be found in the care plan section) Acute Rehab PT Goals PT Goal Formulation: Patient unable to participate in goal setting Time For Goal Achievement: 10/16/18  Potential to Achieve Goals: Good Progress towards PT goals: Progressing toward goals    Frequency    Min 3X/week      PT Plan Current plan remains appropriate;Frequency needs to be updated    Co-evaluation PT/OT/SLP Co-Evaluation/Treatment: Yes Reason for Co-Treatment: Complexity of the patient's impairments (multi-system involvement);Necessary to address  cognition/behavior during functional activity;For patient/therapist safety PT goals addressed during session: Strengthening/ROM        AM-PAC PT "6 Clicks" Mobility   Outcome Measure  Help needed turning from your back to your side while in a flat bed without using bedrails?: Total Help needed moving from lying on your back to sitting on the side of a flat bed without using bedrails?: Total Help needed moving to and from a bed to a chair (including a wheelchair)?: Total Help needed standing up from a chair using your arms (e.g., wheelchair or bedside chair)?: Total Help needed to walk in hospital room?: Total Help needed climbing 3-5 steps with a railing? : Total 6 Click Score: 6    End of Session   Activity Tolerance: Patient limited by fatigue;Patient limited by lethargy Patient left: in bed;with call bell/phone within reach   PT Visit Diagnosis: Muscle weakness (generalized) (M62.81);Difficulty in walking, not elsewhere classified (R26.2)     Time: 5208-0223 PT Time Calculation (min) (ACUTE ONLY): 49 min  Charges:  $Therapeutic Activity: 23-37 mins                     Kenyon Ana, PT  Pager: [phone] Acute Rehab Dept [facility]): [phone]   10/04/2018     [facility] 10/04/2018, 2:36 PM

## 2018-10-04 NOTE — Evaluation (Signed)
Speech Language Pathology Evaluation Patient Details Name: Sabrina Baker MRN: [id number] DOB: 12/25/1940 Today's Date: 10/04/2018 Time: 6767-2094 SLP Time Calculation (min) (ACUTE ONLY): 20 min  Problem List:  Patient Active Problem List   Diagnosis Date Noted  . ARDS (adult respiratory distress syndrome) ([place])   . COVID-19   . Acute respiratory failure with hypoxia ([place]) 09/09/2018  . Mixed dyslipidemia 07/10/2016  . Osteopenia 07/10/2016  . Vitamin B12 deficiency 07/10/2016  . 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 Aspiration pneumonia ([place]) 04/17/2012  . Gastroenteritis 04/17/2012  . Dehydration 04/17/2012  . Diabetes mellitus ([place]) 04/17/2012  . HTN (hypertension) 04/17/2012  . TIA (transient ischemic attack) 04/17/2012   Past Medical History:  Past Medical History:  Diagnosis Date  . Chronic kidney disease    stage 3  . Diabetes mellitus without complication ([place])   . Hypercholesteremia   . Hypertension   . Neuromuscular disorder (HCC)    neuropathy  . Osteopenia   . Retinal detachment   . TIA (transient ischemic attack)   . Vitamin B12 deficiency    Past Surgical History:  Past Surgical History:  Procedure Laterality Date  . [place] VITRECTOMY WITH 20 GAUGE MVR PORT Right 03/16/2015   Procedure: 25 GAUGE PARS PLANA VITRECTOMY WITH 23 GAUGE MVR PORT;  Surgeon: Hayden Pedro, MD;  Location: [place];  Service: Ophthalmology;  Laterality: Right;  . ANKLE SURGERY    . BREAST EXCISIONAL BIOPSY    . BREAST LUMPECTOMY WITH RADIOACTIVE SEED LOCALIZATION Right 01/24/2016   Procedure: RIGHT BREAST LUMPECTOMY WITH RADIOACTIVE SEED LOCALIZATION;  Surgeon: Coralie Keens, MD;  Location: [place];  Service: General;  Laterality: Right;  . EYE SURGERY     detached retna X2  . HEEL SPUR SURGERY  2012  . INJECTION OF SILICONE OIL Right 70/02/6282   Procedure: Extraction OF SILICONE OIL;  Surgeon: Hayden Pedro, MD;  Location:  [place];  Service: Ophthalmology;  Laterality: Right;  . ORIF ANKLE FRACTURE Left 06/17/2014   Procedure: OPEN REDUCTION INTERNAL FIXATION (ORIF) LEFT BIMALLEOLAR ANKLE FRACTURE;  Surgeon: Marianna Payment, MD;  Location: [facility];  Service: Orthopedics;  Laterality: Left;  . PHOTOCOAGULATION WITH LASER Right 03/16/2015   Procedure: PHOTOCOAGULATION WITH LASER;  Surgeon: Hayden Pedro, MD;  Location: [place];  Service: Ophthalmology;  Laterality: Right;  . SHOULDER ARTHROSCOPY Right    plates and screws  . VITRECTOMY Right 03/16/2015  . WRIST ARTHROPLASTY Left    plates and screws and bone graft   HPI:  Patient is a 78 y.o. female with PMHx of HTN, DM, CKD stage III, presented with shortness of breath and cough, found to have acute hypoxic respiratory failure due to ARDS in the setting of COVID-19 infection. ETT 3/30-4/15, stridor and vocal fold edema; reintubated 4/15-4/20.    Assessment / Plan / Recommendation Clinical Impression  Pt seen for cognitive evaluation. MOCA was initially attempted, but pt had significant difficulty completing any of the subtests, so more informal assessment was utilized. She has slowed processing, needing additional time and Min cues to follow simple, one-step commands within a functional context. She has superficial understanding of her current situation ("I got the virus"), but does not know where she is. When asked what month or year it was, she consistently responded with a day of the week despite cueing and rephrasing from SLP. Comprehension is impaired, but suspect that largely some of her impairments also come from significantly reduced  storage and retrieval of new information. Per OT note on previous date, it appears as though she was completely independent PTA, making this a significant change. SLP will f/u to facilitate cognitive recovery.    SLP Assessment  SLP Recommendation/Assessment: Patient needs continued Speech Lanaguage Pathology Services SLP Visit  Diagnosis: Cognitive communication deficit (R41.841)    Follow Up Recommendations  Skilled Nursing facility    Frequency and Duration min 2x/week  2 weeks      SLP Evaluation Cognition  Overall Cognitive Status: Impaired/Different from baseline Arousal/Alertness: Awake/alert Orientation Level: Oriented to person;Disoriented to place;Disoriented to time;Oriented to situation Attention: Sustained Sustained Attention: Impaired Sustained Attention Impairment: Verbal basic Memory: Impaired Memory Impairment: Storage deficit;Retrieval deficit;Decreased recall of new information Awareness: Impaired Awareness Impairment: Intellectual impairment;Emergent impairment;Anticipatory impairment Problem Solving: Impaired Problem Solving Impairment: Functional basic Safety/Judgment: Impaired       Comprehension  Auditory Comprehension Overall Auditory Comprehension: Impaired Commands: Impaired One Step Basic Commands: 75-100% accurate Conversation: Simple    Expression Expression Primary Mode of Expression: Verbal Verbal Expression Overall Verbal Expression: Appears within functional limits for tasks assessed   Oral / Motor  Oral Motor/Sensory Function Overall Oral Motor/Sensory Function: Within functional limits Motor Speech Overall Motor Speech: Impaired Respiration: Within functional limits Phonation: Low vocal intensity(mild, post-extubation) Resonance: Within functional limits Articulation: Impaired Level of Impairment: Sentence Intelligibility: Intelligibility reduced Sentence: 75-100% accurate Interfering Components: Inadequate dentition(dentures not present)   GO                    Venita Sheffield Mariaelena Cade 10/04/2018, 4:54 PM  Pollyann Glen, M.A. [place] Acute Environmental education officer [phone] Office [phone]

## 2018-10-05 LAB — BASIC METABOLIC PANEL
Anion gap: 6 (ref 5–15)
BUN: 30 mg/dL — ABNORMAL HIGH (ref 8–23)
CO2: 27 mmol/L (ref 22–32)
Calcium: 7.6 mg/dL — ABNORMAL LOW (ref 8.9–10.3)
Chloride: 107 mmol/L (ref 98–111)
Creatinine, Ser: 1.2 mg/dL — ABNORMAL HIGH (ref 0.44–1.00)
GFR calc Af Amer: 50 mL/min — ABNORMAL LOW (ref >60)
GFR calc non Af Amer: 44 mL/min — ABNORMAL LOW (ref >60)
Glucose, Bld: 110 mg/dL — ABNORMAL HIGH (ref 70–99)
Potassium: 3.7 mmol/L (ref 3.5–5.1)
Sodium: 140 mmol/L (ref 135–145)

## 2018-10-05 LAB — CBC
HCT: 26 % — ABNORMAL LOW (ref 36.0–46.0)
Hemoglobin: 7.8 g/dL — ABNORMAL LOW (ref 12.0–15.0)
MCH: 31.1 pg (ref 26.0–34.0)
MCHC: 30 g/dL (ref 30.0–36.0)
MCV: 103.6 fL — ABNORMAL HIGH (ref 80.0–100.0)
Platelets: 332 10*3/uL (ref 150–400)
RBC: 2.51 MIL/uL — ABNORMAL LOW (ref 3.87–5.11)
RDW: 15.6 % — ABNORMAL HIGH (ref 11.5–15.5)
WBC: 16.6 10*3/uL — ABNORMAL HIGH (ref 4.0–10.5)
nRBC: 0 % (ref 0.0–0.2)

## 2018-10-05 LAB — GLUCOSE, CAPILLARY
Glucose-Capillary: 117 mg/dL — ABNORMAL HIGH (ref 70–99)
Glucose-Capillary: 129 mg/dL — ABNORMAL HIGH (ref 70–99)
Glucose-Capillary: 180 mg/dL — ABNORMAL HIGH (ref 70–99)
Glucose-Capillary: 362 mg/dL — ABNORMAL HIGH (ref 70–99)
Glucose-Capillary: 409 mg/dL — ABNORMAL HIGH (ref 70–99)
Glucose-Capillary: 55 mg/dL — ABNORMAL LOW (ref 70–99)
Glucose-Capillary: 92 mg/dL (ref 70–99)

## 2018-10-05 LAB — PROCALCITONIN: Procalcitonin: 0.38 ng/mL

## 2018-10-05 MED ORDER — QUETIAPINE FUMARATE 25 MG PO TABS
25.0000 mg | ORAL_TABLET | Freq: Every day | ORAL | Status: DC
  Administered 2018-10-06 – 2018-10-10 (×5): 25 mg via ORAL
  Filled 2018-10-05 (×6): qty 1

## 2018-10-05 MED ORDER — COLCHICINE 0.6 MG PO TABS
0.6000 mg | ORAL_TABLET | Freq: Once | ORAL | Status: AC
  Administered 2018-10-05: 0.6 mg via ORAL
  Filled 2018-10-05: qty 1

## 2018-10-05 MED ORDER — FUROSEMIDE 10 MG/ML IJ SOLN
40.0000 mg | Freq: Every day | INTRAMUSCULAR | Status: DC
  Administered 2018-10-05 – 2018-10-07 (×3): 40 mg via INTRAVENOUS
  Filled 2018-10-05 (×3): qty 4

## 2018-10-05 MED ORDER — INSULIN ASPART 100 UNIT/ML ~~LOC~~ SOLN
0.0000 [IU] | Freq: Three times a day (TID) | SUBCUTANEOUS | Status: DC
  Administered 2018-10-05: 9 [IU] via SUBCUTANEOUS
  Administered 2018-10-05: 2 [IU] via SUBCUTANEOUS

## 2018-10-05 MED ORDER — METHYLPREDNISOLONE SODIUM SUCC 125 MG IJ SOLR
80.0000 mg | Freq: Once | INTRAMUSCULAR | Status: AC
  Administered 2018-10-05: 80 mg via INTRAVENOUS
  Filled 2018-10-05: qty 2

## 2018-10-05 MED ORDER — POTASSIUM CHLORIDE CRYS ER 20 MEQ PO TBCR
30.0000 meq | EXTENDED_RELEASE_TABLET | ORAL | Status: AC
  Administered 2018-10-05 (×2): 30 meq via ORAL
  Filled 2018-10-05: qty 2
  Filled 2018-10-05: qty 1.5

## 2018-10-05 NOTE — Progress Notes (Signed)
  Speech Language Pathology Treatment: Dysphagia;Cognitive-Linquistic  Patient Details Name: Sabrina Baker MRN: [id number] DOB: [birth year] Today's Date: 10/05/2018 Time: 5790-3833 SLP Time Calculation (min) (ACUTE ONLY): 28 min  Assessment / Plan / Recommendation Clinical Impression  RN reports pt has been eager to drink and has tolerated well throughout the day. SLP allowed pt to drink nectar thick liquids consecutively. Slight wet quality noted to voice, but no coughing, Increasingly pt belched after sips and reports that she is actively trying to do so to relieve a sensation pressure. She would like to have some Dr. Malachi Bonds to help her burp. Pt noted to have telegraphic language, deleting pronouns and prepositions Ex "Cold, want blanket." "live [place], [place]." When cued, pt was able to produce adequate conversational language and denied speaking that way due to fatigue. She said she "needs time to think" though word finding otherwise appears adequate. Will continue efforts, pt to continue modified diet a this time.   HPI        SLP Plan  Continue with current plan of care       Recommendations  Diet recommendations: Dysphagia 1 (puree);Nectar-thick liquid Liquids provided via: Straw Medication Administration: Whole meds with puree Supervision: Staff to assist with self feeding;Full supervision/cueing for compensatory strategies Compensations: Minimize environmental distractions;Slow rate;Small sips/bites Postural Changes and/or Swallow Maneuvers: Seated upright 90 degrees;Upright 30-60 min after meal                Oral Care Recommendations: Oral care BID Follow up Recommendations: Skilled Nursing facility SLP Visit Diagnosis: Cognitive communication deficit (X83.291) Plan: Continue with current plan of care       GO               Herbie Baltimore, MA [place] Pager [phone] Office [phone]  Lynann Beaver 10/05/2018, 4:58 PM

## 2018-10-05 NOTE — Progress Notes (Signed)
PROGRESS NOTE                                                                                                                                                                                                             Patient Demographics:    Sabrina Baker, is a 78 y.o. female, DOB - 08-Oct-1940, TFT:[id number]  Admit date - 09/09/2018   Admitting Physician Laurin Coder, MD  Outpatient Primary MD for the patient is [name], MD  LOS - 26   Chief Complaint  Patient presents with   Abdominal Pain   Cough       Brief Narrative    78 y.o.femalewith PMHx of HTN, DM, CKD stage III, presented with shortness of breath and cough, found to have acute hypoxic respiratory failure due to ARDS in the setting of COVID-19 infection.  3/30Admitted, intubated 3/21 R UE PICC 3/31>4/5 Plaquenil 4/13 transfer to [facility] 4/15 Extubated 4/15 Stridor-cord edema > reintubated 4/21 extubated    Subjective:    Sabrina Baker today reports some cough, generalized weakness, and poor appetite , right thumb swelling and erythema, resemble gout, fever 101.8 this morning .  Complains of right wrist/thumb pain.   Assessment  & Plan :    Active Problems:   Acute respiratory failure with hypoxia (HCC)   ARDS (adult respiratory distress syndrome) (HCC)   COVID-19   Acute Hypoxic Resp. Failure due to Covid 19 ARDS -She  was initially extubated on 4/15, then she developed stridor  secondary to cord edema, requiring reintubation the same day, she was successfully extubated on 4/21. -She remains on 2 L nasal cannula this morning, she was encouraged again to use incentive spirometry, was out of bed to chair yesterday, encouraged to continue again today, unable to tolerate awake prone, instructed to lay in the left side of able to tolerate. -He is having some edema this morning, will start on IV Lasix 40 mg daily -See if Plaquenil    UTI -Urine culture growing  both Citrobacter and enterococcus, she received total of 7 days of IV ampicillin and Rocephin.  Abnormal thyroid function tests TSH 0.551 with free T4 4.3 and free T3 0.8 - no known history of thyroid disease - most c/w sick euthyroid state - do not feel supplementation is indicated - recheck thyroid fxn after pt is recovered from  her critical illness   Fever -Patient had fever 101.7 on 10/05/2018, blood cultures remain with no growth to date, nontoxic-appearing, UA is negative, chest x-ray with no acute findings, will hold on initiating antibiotics, continue to monitor procalcitonin .  Agitation/delirium Minimize sedation as able and follow   AKI on CKD stage III crt steadily improving and appears to be at her baseline   Accelerated hypertension Bp variable - follow w/o change in tx plan for today   Paroxysmal atrial fibrillation NSR at this time - continue Eliquis  Macrocytic anemia no evidence of overt bleeding - Ferritin 467 - Folate 19.4 - B12 2058 -hemoglobin 7.8, no indication to transfuse  DM-2/Hypoglycemia A1c 7.3 - steroids stopped 4/22, she had hypoglycemia this morning, I will hold on starting Lantus, will change her sliding scale to before meals.  Gout attack -Right thumb, and second right digit, used to be less swollen and tender today, continue with colchicine, will give extra dose today, will give 1 dose of IV Solu-Medrol as well  Hypernatremia -resolved   Code Status : Full code  Family Communication  : D/W daughter via phone 2/24.  Disposition Plan  : will need SNF when stable  Barriers For Discharge : remains hypoxic  Consults  :  PCCM  Procedures  :  3/30>> ETT 3/21>> RUE PICC 4/10>> A line-out 4/15>>extubated 4/15>>ETT (re-intubated) 4/20>> Extubated DVT Prophylaxis  :  On Eliquis  Lab Results  Component Value Date   PLT 332 10/05/2018    Antibiotics  :    Anti-infectives (From admission, onward)   Start     Dose/Rate Route  Frequency Ordered Stop   09/27/18 1300  cefTRIAXone (ROCEPHIN) 2 g in sodium chloride 0.9 % 100 mL IVPB     2 g 200 mL/hr over 30 Minutes Intravenous Every 24 hours 09/27/18 0928 10/03/18 1015   09/26/18 1400  ampicillin (OMNIPEN) 1 g in sodium chloride 0.9 % 100 mL IVPB     1 g 300 mL/hr over 20 Minutes Intravenous Every 8 hours 09/26/18 1204 10/03/18 2128   09/26/18 1300  cefTRIAXone (ROCEPHIN) 1 g in sodium chloride 0.9 % 100 mL IVPB  Status:  Discontinued     1 g 200 mL/hr over 30 Minutes Intravenous Every 24 hours 09/26/18 1204 09/27/18 0928   09/11/18 2200  hydroxychloroquine (PLAQUENIL) tablet 200 mg  Status:  Discontinued     200 mg Oral 2 times daily 09/10/18 2309 09/10/18 2319   09/11/18 2200  hydroxychloroquine (PLAQUENIL) tablet 200 mg     200 mg Per Tube 2 times daily 09/10/18 2319 09/15/18 0851   09/10/18 2330  hydroxychloroquine (PLAQUENIL) tablet 400 mg     400 mg Per Tube 2 times daily 09/10/18 2319 09/11/18 0823   09/10/18 2315  hydroxychloroquine (PLAQUENIL) tablet 400 mg  Status:  Discontinued     400 mg Oral 2 times daily 09/10/18 2309 09/10/18 2319   09/10/18 1300  azithromycin (ZITHROMAX) 500 mg in sodium chloride 0.9 % 250 mL IVPB     500 mg 250 mL/hr over 60 Minutes Intravenous Every 24 hours 09/09/18 1342 09/15/18 1531   09/10/18 1200  cefTRIAXone (ROCEPHIN) 1 g in sodium chloride 0.9 % 100 mL IVPB  Status:  Discontinued     1 g 200 mL/hr over 30 Minutes Intravenous Every 24 hours 09/09/18 1342 09/14/18 1436   09/09/18 1200  cefTRIAXone (ROCEPHIN) 1 g in sodium chloride 0.9 % 100 mL IVPB     1 g 200  mL/hr over 30 Minutes Intravenous  Once 09/09/18 1148 09/09/18 1248   09/09/18 1200  azithromycin (ZITHROMAX) 500 mg in sodium chloride 0.9 % 250 mL IVPB     500 mg 250 mL/hr over 60 Minutes Intravenous  Once 09/09/18 1148 09/09/18 1351        Objective:   Vitals:   10/05/18 0400 10/05/18 0500 10/05/18 0600 10/05/18 0910  BP: (!) 149/98  140/62 (!) 156/74   Pulse: 90 90 86 97  Resp: (!) 27 20 18    Temp:    98.6 F (37 C)  TempSrc:    Axillary  SpO2: 95% 93% 95%   Weight:      Height:        Wt Readings from Last 3 Encounters:  06/21/17 70.3 kg  01/24/16 67.1 kg  03/16/15 76.2 kg     Intake/Output Summary (Last 24 hours) at 10/05/2018 1114 Last data filed at 10/05/2018 0900 Gross per 24 hour  Intake 1290.91 ml  Output 625 ml  Net 665.91 ml     Physical Exam  Awake Alert, more conversant and appropriate today, but still looks extremely frail  symmetrical Chest wall movement, diminished air entry at the bases, with no wheezing RRR,No Gallops,Rubs or new Murmurs, No Parasternal Heave +ve B.Sounds, Abd Soft, No tenderness, No rebound - guarding or rigidity. No Cyanosis, Clubbing ,No new Rash or bruise , she started to develop some mild edema, she has swelling with erythema in her right thumb, and in the right hand second digit secondary to gout    Data Review:    CBC Recent Labs  Lab 09/30/18 0500 10/01/18 0235 10/02/18 0315 10/03/18 0205 10/05/18 0550  WBC 12.1* 16.0* 23.7* 15.4* 16.6*  HGB 8.9* 9.3* 10.1* 8.2* 7.8*  HCT 29.4* 30.7* 34.2* 28.2* 26.0*  PLT 557* 605* 702* 535* 332  MCV 101.0* 101.0* 100.9* 102.9* 103.6*  MCH 30.6 30.6 29.8 29.9 31.1  MCHC 30.3 30.3 29.5* 29.1* 30.0  RDW 14.4 14.3 15.4 15.9* 15.6*  LYMPHSABS  --   --   --  1.7  --   MONOABS  --   --   --  1.5*  --   EOSABS  --   --   --  0.9*  --   BASOSABS  --   --   --  0.0  --     Chemistries  Recent Labs  Lab 10/01/18 0235 10/02/18 0315 10/03/18 0205 10/04/18 0140 10/05/18 0550  NA 146* 148* 145 146* 140  K 4.2 3.8 4.0 4.3 3.7  CL 108 112* 109 117* 107  CO2 30 26 28 26 27   GLUCOSE 84 112* 163* 196* 110*  BUN 71* 85* 74* 88* 30*  CREATININE 1.14* 1.70* 1.55* 1.05* 1.20*  CALCIUM 8.1* 8.1* 8.1* 9.3 7.6*  AST  --   --  24  --   --   ALT  --   --  22  --   --   ALKPHOS  --   --  41  --   --   BILITOT  --   --  0.4  --   --     ------------------------------------------------------------------------------------------------------------------ No results for input(s): CHOL, HDL, LDLCALC, TRIG, CHOLHDL, LDLDIRECT in the last 72 hours.  Lab Results  Component Value Date   HGBA1C 7.3 (H) 04/19/2012   ------------------------------------------------------------------------------------------------------------------ No results for input(s): TSH, T4TOTAL, T3FREE, THYROIDAB in the last 72 hours.  Invalid input(s): FREET3 ------------------------------------------------------------------------------------------------------------------ No results for input(s): VITAMINB12, FOLATE, FERRITIN, TIBC,  IRON, RETICCTPCT in the last 72 hours.  Coagulation profile No results for input(s): INR, PROTIME in the last 168 hours.  No results for input(s): DDIMER in the last 72 hours.  Cardiac Enzymes No results for input(s): CKMB, TROPONINI, MYOGLOBIN in the last 168 hours.  Invalid input(s): CK ------------------------------------------------------------------------------------------------------------------    Component Value Date/Time   BNP 278.8 (H) 09/25/2018 0350    Inpatient Medications  Scheduled Meds:  apixaban  5 mg Oral BID   carvedilol  6.25 mg Oral BID WC   chlorhexidine  15 mL Mouth/Throat BID   Chlorhexidine Gluconate Cloth  6 each Topical Daily   colchicine  0.6 mg Oral Daily   colchicine  0.6 mg Oral Once   furosemide  40 mg Intravenous Daily   gabapentin  100 mg Oral QHS   guaiFENesin  1,200 mg Oral BID   insulin aspart  0-9 Units Subcutaneous TID WC   methylPREDNISolone (SOLU-MEDROL) injection  80 mg Intravenous Once   pantoprazole  40 mg Oral Daily   polyethylene glycol  17 g Oral Daily   potassium chloride  30 mEq Oral Q4H   [START ON 10/06/2018] QUEtiapine  25 mg Oral QHS   sodium chloride flush  10-40 mL Intracatheter Q12H   Continuous Infusions:  sodium chloride 10 mL/hr at  10/05/18 0600   PRN Meds:.sodium chloride, acetaminophen (TYLENOL) oral liquid 160 mg/5 mL, albuterol, haloperidol lactate, hydrALAZINE, lip balm, Resource ThickenUp Clear, sodium chloride flush  Micro Results Recent Results (from the past 240 hour(s))  Culture, respiratory (non-expectorated)     Status: None   Collection Time: 09/28/18 11:06 AM  Result Value Ref Range Status   Specimen Description   Final    TRACHEAL ASPIRATE Performed at [facility] [address], [place], [place] [address]    Special Requests   Final    Normal Performed at [facility], [place] [address], [place], [place] [address]    Gram Stain   Final    RARE WBC PRESENT, PREDOMINANTLY PMN RARE GRAM POSITIVE COCCI RARE GRAM POSITIVE RODS    Culture   Final    FEW Consistent with normal respiratory flora. Performed at [facility], [place] [address] [place], [place] [address]    Report Status 10/01/2018 FINAL  Final  SARS Coronavirus 2 [facility] order, Performed in [place] hospital lab)     Status: Abnormal   Collection Time: 09/28/18 11:18 AM  Result Value Ref Range Status   SARS Coronavirus 2 POSITIVE (A) NEGATIVE Corrected    Comment: RESULT CALLED TO, READ BACK BY AND VERIFIED WITH: Quentin Angst RN @1945  ON 09/28/2018 JACKSON,K Performed at [facility], [place] [address], [place], [place] [address] CORRECTED ON 04/18 AT 1954: PREVIOUSLY REPORTED AS POSITIVE CRITICAL RESULT CALLED TO, READ BACK BY AND VERIFIED WITH: TETREAULT, H RN @1945  ON 09/28/2018 JACKSON,K   Culture, blood (Routine X 2) w Reflex to ID Panel     Status: None (Preliminary result)   Collection Time: 10/04/18  1:00 PM  Result Value Ref Range Status   Specimen Description BLOOD LEFT HAND  Final   Special Requests   Final    BOTTLES DRAWN AEROBIC AND ANAEROBIC Blood Culture adequate volume Performed at [facility], [place] [address], [place], [place] [address]     Culture PENDING  Incomplete   Report Status PENDING  Incomplete    Radiology Reports Dg Chest Port 1 View  Result Date: 10/04/2018 CLINICAL  DATA:  Fever. EXAM: PORTABLE CHEST 1 VIEW COMPARISON:  Chest x-ray dated September 29, 2018. FINDINGS: Interval removal of the endotracheal and enteric tubes. Unchanged right upper extremity PICC line. Stable cardiomediastinal silhouette. Persistent low lung volumes. Patchy bilateral airspace disease appears improved at the lung bases, but slightly worsened in the right upper lobe. No pneumothorax or large pleural effusion. No acute osseous abnormality. IMPRESSION: 1. Patchy bilateral airspace disease appears improved at the lung bases, but slightly worsened in the right upper lobe. Electronically Signed   By: Titus Dubin M.D.   On: 10/04/2018 12:05   Dg Chest Port 1 View  Result Date: 09/29/2018 CLINICAL DATA:  Acute hypoxia due to COVID 19 Fever 101.7 yesterday EXAM: PORTABLE CHEST 1 VIEW COMPARISON:  09/26/2018 FINDINGS: Endotracheal tube, NG tube, PICC line unchanged. Stable cardiac silhouette. Low lung volumes. Some improvement in pulmonary parenchymal opacities. IMPRESSION: 1. Mild improvement pulmonary parenchymal opacities. 2. Persistent low lung volumes. 3. Stable support apparatus. Electronically Signed   By: Suzy Bouchard M.D.   On: 09/29/2018 06:30   Dg Chest Port 1 View  Result Date: 09/26/2018 CLINICAL DATA:  Shortness of breath. EXAM: PORTABLE CHEST 1 VIEW COMPARISON:  One-view chest x-ray 09/25/2018 FINDINGS: The heart size is exaggerate by low lung volumes. The endotracheal tube has been pulled back slightly, now terminating 2.5 cm above the carina. A right-sided PICC line is stable. The NG tube courses off the inferior border of the film. Bilateral patchy interstitial and airspace disease is again noted. There is less right middle lobe consolidation. No definite effusions are present. IMPRESSION: 1. Improved aeration of the right middle lobe.  2. Persistent patchy interstitial and airspace disease bilaterally is worrisome infection or edema. 3. Repositioning of endotracheal tube, now 2.5 cm above the carina. Electronically Signed   By: San Morelle M.D.   On: 09/26/2018 07:52   Dg Chest Port 1 View  Result Date: 09/25/2018 CLINICAL DATA:  78 year old female with a history of intubation EXAM: PORTABLE CHEST 1 VIEW COMPARISON:  09/24/2018 FINDINGS: Endotracheal tube now terminates at the carina. Tube may be withdrawn 6 cm-7 cm for better positioning. Right upper extremity PICC. Gastric tube terminates out of the field of view. Low lung volumes with similar appearance of reticulonodular opacity throughout with increasing airspace opacity in the right mid lung. Persistently low lung volumes.  No pneumothorax or pleural effusion. IMPRESSION: Endotracheal tube terminates at the carina and may be withdrawn 6 cm-7 cm for better positioning. Low lung volumes persist with mixed interstitial and airspace opacities bilaterally, with increasing airspace opacity in the right mid lung. Unchanged right upper extremity PICC and gastric tube. Electronically Signed   By: Corrie Mckusick D.O.   On: 09/25/2018 19:11   Dg Chest Port 1 View  Result Date: 09/24/2018 CLINICAL DATA:  Short of breath EXAM: PORTABLE CHEST 1 VIEW COMPARISON:  09/23/2018 FINDINGS: Normal heart size. Endotracheal tube, NG tube, and right PICC are stable. Patchy airspace opacities throughout both lungs are not significantly changed. No pneumothorax. IMPRESSION: Stable patchy airspace disease throughout both lungs. Electronically Signed   By: Marybelle Killings M.D.   On: 09/24/2018 07:41   Dg Chest Port 1 View  Result Date: 09/23/2018 CLINICAL DATA:  Respiratory failure. EXAM: PORTABLE CHEST 1 VIEW COMPARISON:  One-view chest x-ray 09/21/2018 FINDINGS: The heart size is normal. Endotracheal tube is stable. NG tube courses off the inferior border the film. Right-sided PICC line is stable and  in satisfactory position. Bilateral interstitial and airspace  disease is similar the prior exam. There is improved aeration at the left base. Increased airspace opacities are noted in the right middle and upper lobes. IMPRESSION: 1. Bilateral interstitial and airspace disease concerning for infection. There is slight increase on the right and some improvement on the left. 2. Support apparatus is stable. Electronically Signed   By: San Morelle M.D.   On: 09/23/2018 07:41   Dg Chest Port 1 View  Result Date: 09/21/2018 CLINICAL DATA:  Respiratory failure. EXAM: PORTABLE CHEST 1 VIEW COMPARISON:  Radiograph of September 21, 2018. FINDINGS: Stable cardiomediastinal silhouette. Endotracheal and nasogastric tubes are in grossly good position. Right-sided PICC line is unchanged. No pneumothorax or significant pleural effusion is noted. Stable bilateral lung opacities are noted concerning for pneumonia or possibly edema. Bony thorax is unremarkable. IMPRESSION: Stable support apparatus. Stable bilateral lung opacities as described above. Electronically Signed   By: Marijo Conception, M.D.   On: 09/21/2018 22:03   Dg Chest Port 1 View  Result Date: 09/21/2018 CLINICAL DATA:  Respiratory failure EXAM: PORTABLE CHEST 1 VIEW COMPARISON:  09/20/2018 FINDINGS: Endotracheal tube in good position. Right arm PICC tip in the proximal SVC unchanged. NG tube in the stomach. Mild improvement in bilateral airspace disease. Small left effusion. IMPRESSION: Endotracheal tube in good position Diffuse bilateral airspace disease with mild improvement. Electronically Signed   By: Franchot Gallo M.D.   On: 09/21/2018 08:10   Dg Chest Port 1 View  Result Date: 09/20/2018 CLINICAL DATA:  Respiratory failure.  Ventilator support. EXAM: PORTABLE CHEST 1 VIEW COMPARISON:  09/18/2018 FINDINGS: Endotracheal tube tip is 4 cm above the carina. Orogastric tube enters the abdomen. Bilateral patchy pulmonary infiltrates appear quite similar,  with slightly more volume loss at the right lung base. No new finding otherwise. IMPRESSION: Persistent patchy bilateral pulmonary infiltrates. Slight worsening of volume loss at the right lung base. Electronically Signed   By: Nelson Chimes M.D.   On: 09/20/2018 06:11   Dg Chest Port 1 View  Result Date: 09/18/2018 CLINICAL DATA:  ARDS, COVID+ Pt intubated on airborne and contact PPE: Gloves, gown, N95, goggles, bouffant. EXAM: PORTABLE CHEST - 1 VIEW COMPARISON:  09/16/2018 FINDINGS: Endotracheal tube, nasogastric tube, right arm PICC stable. Slight improvement in right lower lung airspace disease with patchy hazy infiltrates in the left mid and lower lung and right upper lung as before. Heart size and mediastinal contours are within normal limits. No effusion. No pneumothorax. Fixation hardware in the proximal right humerus. IMPRESSION: 1. Patchy bilateral airspace disease, with slight improvement in right lower lung opacities since prior study. 2. Support hardware stable in position. Electronically Signed   By: Lucrezia Europe M.D.   On: 09/18/2018 08:05   Dg Chest Port 1 View  Result Date: 09/16/2018 CLINICAL DATA:  Respiratory failure EXAM: PORTABLE CHEST 1 VIEW COMPARISON:  Yesterday FINDINGS: Endotracheal tube tip between the clavicular heads and carina. The orogastric tube at least reaches the stomach. Right-sided central line with tip at the SVC. No appreciable change in patchy bilateral interstitial and airspace opacity. Stable heart size. No evidence of air leak or pleural fluid. IMPRESSION: 1. Unchanged multifocal pneumonia. 2. Stable hardware positioning. Electronically Signed   By: Monte Fantasia M.D.   On: 09/16/2018 07:09   Dg Chest Port 1 View  Result Date: 09/15/2018 CLINICAL DATA:  Respiratory failure. Intubated patient. Follow-up exam. EXAM: PORTABLE CHEST 1 VIEW COMPARISON:  Prior studies, most recent 09/14/2018 FINDINGS: Cardiac silhouette is normal size.  No  mediastinal or hilar masses.  There are hazy bilateral airspace lung opacities which are without change from the previous day's exam. No new lung abnormalities. No convincing pleural effusion and no pneumothorax. Endotracheal tube, nasal/orogastric tube and right PICC are stable. IMPRESSION: 1. No change from previous day's study. 2. Persistent bilateral ground-glass lung opacities. 3. Stable well-positioned support apparatus. Electronically Signed   By: Lajean Manes M.D.   On: 09/15/2018 06:47   Dg Chest Port 1 View  Result Date: 09/14/2018 CLINICAL DATA:  Respiratory failure.  COVID-19. EXAM: PORTABLE CHEST 1 VIEW COMPARISON:  09/13/2018 and prior studies dating back to 09/09/2018. FINDINGS: Bilateral ground-glass lung opacities are without significant change from the prior exams. Atelectasis at the left lung base is stable. More confluent opacity at the right lung base is less prominent, which appears to be due to larger lung volumes on the current exam. No new lung abnormalities.  No pleural effusion or pneumothorax. Endotracheal tube, nasal/orogastric tube and right PICC are stable in well positioned. IMPRESSION: 1. No significant change from the previous day's study. 2. Persistent bilateral ground-glass lung opacities more confluent basilar opacities. 3. Stable well-positioned support apparatus. Electronically Signed   By: Lajean Manes M.D.   On: 09/14/2018 06:24   Dg Chest Port 1 View  Result Date: 09/13/2018 CLINICAL DATA:  Respiratory failure EXAM: PORTABLE CHEST 1 VIEW COMPARISON:  Yesterday FINDINGS: Endotracheal tube tip between the clavicular heads and carina. The orogastric tube has been advanced and now at least reaches the stomach-as confirmed on interval KUB. Right PICC with tip at the SVC. Patchy bilateral airspace disease. Borderline heart size. No effusion or pneumothorax. IMPRESSION: Stable hardware positioning and bilateral pneumonia. Electronically Signed   By: Monte Fantasia M.D.   On: 09/13/2018 06:08   Dg  Chest Port 1 View  Result Date: 09/12/2018 CLINICAL DATA:  History of endotracheal tube EXAM: PORTABLE CHEST 1 VIEW COMPARISON:  Yesterday FINDINGS: Endotracheal tube tip between the clavicular heads and carina. The orogastric tube tip is in the region of the GE junction. Right PICC with tip near the SVC origin. Patchy bilateral lung opacity with both interstitial and airspace appearance. No effusion or pneumothorax. Normal heart size. These results will be called to the ordering clinician or representative by the Radiologist Assistant, and communication documented in the PACS or zVision Dashboard. IMPRESSION: 1. Shortened orogastric tube with tip now at the GE junction. 2. Other hardware positioning is stable. 3. Bilateral lung opacity with progression. Electronically Signed   By: Monte Fantasia M.D.   On: 09/12/2018 07:14   Dg Chest Port 1 View  Result Date: 09/11/2018 CLINICAL DATA:  Respiratory failure EXAM: PORTABLE CHEST 1 VIEW COMPARISON:  09/10/2018 FINDINGS: Endotracheal tube and NG tube are stable. Interval placement of right PICC line with the tip in the SVC. Patchy bilateral airspace disease similar to prior study. Heart is normal size. No visible effusions or acute bony abnormality. IMPRESSION: Right PICC line tip in the SVC. Remainder of the support devices stable. Stable patchy bilateral airspace disease. Electronically Signed   By: Rolm Baptise M.D.   On: 09/11/2018 07:36   Dg Chest Port 1 View  Result Date: 09/10/2018 CLINICAL DATA:  Respiratory failure EXAM: PORTABLE CHEST 1 VIEW COMPARISON:  09/09/2018 FINDINGS: Support devices are stable. Heart is normal size. Patchy bilateral airspace disease again noted, not significantly changed. No visible effusions or acute bony abnormality. IMPRESSION: Patchy bilateral airspace disease, not significantly changed. Electronically Signed   By: Rolm Baptise  M.D.   On: 09/10/2018 07:39   Dg Chest Port 1 View  Result Date: 09/09/2018 CLINICAL DATA:   Respiratory failure. EXAM: PORTABLE CHEST 1 VIEW COMPARISON:  Chest x-ray from same day at 1 a.m. FINDINGS: Interval placement of an endotracheal tube with the tip 3.9 cm above the carina. Enteric tube entering the stomach with the tip below the field of view. The heart size and mediastinal contours are within normal limits. Patchy asymmetric, hazy opacities in both lungs again noted, slightly more consolidative appearance in the left mid lung and right lower lobe. No pleural effusion or pneumothorax. No acute osseous abnormality. IMPRESSION: 1. Appropriately positioned endotracheal tube. 2. Patchy asymmetric airspace disease in both lungs again noted, with slightly more focal consolidative appearance in the left mid lung and right lower lobe. Findings are nonspecific, but concerning for atypical infection, including potential viral pneumonia. Electronically Signed   By: Titus Dubin M.D.   On: 09/09/2018 17:38   Dg Chest Port 1 View  Result Date: 09/09/2018 CLINICAL DATA:  Short of breath, cough.  Diarrhea. EXAM: PORTABLE CHEST 1 VIEW COMPARISON:  Chest radiograph 05/29/2012 FINDINGS: Normal cardiac silhouette. There is bilateral fine airspace disease in the upper lobes. No pleural fluid. No pneumothorax. No acute osseous abnormality. RIGHT shoulder internal fixation. IMPRESSION: Bilateral upper lobe airspace disease representing multifocal pneumonia versus pulmonary edema. Electronically Signed   By: Suzy Bouchard M.D.   On: 09/09/2018 11:27   Dg Abd Portable 1v  Result Date: 09/25/2018 CLINICAL DATA:  78 y/o  F; intubation, NG placement. EXAM: PORTABLE ABDOMEN - 1 VIEW COMPARISON:  09/12/2018 abdomen radiographs. FINDINGS: Enteric tube tip projects over the gastric body. Normal bowel gas pattern. Ill-defined patchy opacities in lung bases. No acute osseous abnormality is evident. IMPRESSION: Enteric tube tip projects over the gastric body. Normal bowel gas pattern. Electronically Signed   By: Kristine Garbe M.D.   On: 09/25/2018 19:19   Dg Abd Portable 1v  Result Date: 09/12/2018 CLINICAL DATA:  Orogastric tube placement. EXAM: PORTABLE ABDOMEN - 1 VIEW COMPARISON:  CT abdomen and pelvis June 24, 2018 FINDINGS: Nasogastric tube tip projects in gastric antrum. No intra-abdominal mass effect or pathologic calcifications. Included bowel gas pattern is nondilated and nonobstructive. Mild vascular calcifications. Interstitial and alveolar airspace opacities included lung bases better characterized on today's dedicated radiograph. Thoracolumbar levoscoliosis. IMPRESSION: 1. Nasogastric tube tip projects in gastric antrum. Electronically Signed   By: Elon Alas M.D.   On: 09/12/2018 22:13   [place] Ekg Site Rite  Result Date: 09/10/2018 If Site Rite image not attached, placement could not be confirmed due to current cardiac rhythm.   Phillips Climes M.D on 10/05/2018 at 11:14 AM  Between 7am to 7pm - Pager - [phone]  After 7pm go to [email or web address] - password [facility]  [facility] -  Office  [phone]

## 2018-10-05 NOTE — Plan of Care (Signed)
  Problem: Skin Integrity: Goal: Risk for impaired skin integrity will decrease Outcome: Progressing   Problem: Education: Goal: Knowledge of General Education information will improve Description Including pain rating scale, medication(s)/side effects and non-pharmacologic comfort measures Outcome: Not Progressing   Problem: Health Behavior/Discharge Planning: Goal: Ability to manage health-related needs will improve Outcome: Not Progressing   Problem: Clinical Measurements: Goal: Ability to maintain clinical measurements within normal limits will improve Outcome: Progressing Goal: Will remain free from infection Outcome: Progressing Goal: Diagnostic test results will improve Outcome: Progressing Goal: Respiratory complications will improve Outcome: Progressing Goal: Cardiovascular complication will be avoided Outcome: Progressing   Problem: Activity: Goal: Risk for activity intolerance will decrease Outcome: Progressing   Problem: Nutrition: Goal: Adequate nutrition will be maintained Outcome: Progressing   Problem: Coping: Goal: Level of anxiety will decrease Outcome: Progressing   Problem: Elimination: Goal: Will not experience complications related to bowel motility Outcome: Progressing   Problem: Pain Managment: Goal: General experience of comfort will improve Outcome: Progressing   Problem: Safety: Goal: Ability to remain free from injury will improve Outcome: Progressing   Problem: Skin Integrity: Goal: Risk for impaired skin integrity will decrease Outcome: Progressing   Problem: Respiratory: Goal: Ability to maintain a clear airway and adequate ventilation will improve Outcome: Progressing   Problem: Respiratory: Goal: Will maintain a patent airway Outcome: Progressing Goal: Complications related to the disease process, condition or treatment will be avoided or minimized Outcome: Progressing

## 2018-10-05 NOTE — Progress Notes (Addendum)
Hypoglycemic Event  CBG: 55  Treatment: Amp of D50 IV  Symptoms: Lethargic, clammy  Follow-up CBG: Time:0015  CBG Result: 129   Possible Reasons for Event: decreased appetite/decrfeased po intake Comments/MD notified: progress note entered detailing hypoglycemic event     Blase Mess

## 2018-10-06 LAB — BASIC METABOLIC PANEL
Anion gap: 8 (ref 5–15)
BUN: 26 mg/dL — ABNORMAL HIGH (ref 8–23)
CO2: 26 mmol/L (ref 22–32)
Calcium: 7.6 mg/dL — ABNORMAL LOW (ref 8.9–10.3)
Chloride: 101 mmol/L (ref 98–111)
Creatinine, Ser: 1.26 mg/dL — ABNORMAL HIGH (ref 0.44–1.00)
GFR calc Af Amer: 48 mL/min — ABNORMAL LOW (ref >60)
GFR calc non Af Amer: 41 mL/min — ABNORMAL LOW (ref >60)
Glucose, Bld: 305 mg/dL — ABNORMAL HIGH (ref 70–99)
Potassium: 4.6 mmol/L (ref 3.5–5.1)
Sodium: 135 mmol/L (ref 135–145)

## 2018-10-06 LAB — CBC
HCT: 37.8 % (ref 36.0–46.0)
Hemoglobin: 11.4 g/dL — ABNORMAL LOW (ref 12.0–15.0)
MCH: 29.8 pg (ref 26.0–34.0)
MCHC: 30.2 g/dL (ref 30.0–36.0)
MCV: 99 fL (ref 80.0–100.0)
Platelets: 344 10*3/uL (ref 150–400)
RBC: 3.82 MIL/uL — ABNORMAL LOW (ref 3.87–5.11)
RDW: 15.1 % (ref 11.5–15.5)
WBC: 17.2 10*3/uL — ABNORMAL HIGH (ref 4.0–10.5)
nRBC: 0 % (ref 0.0–0.2)

## 2018-10-06 LAB — PROCALCITONIN: Procalcitonin: 0.41 ng/mL

## 2018-10-06 LAB — GLUCOSE, CAPILLARY
Glucose-Capillary: 213 mg/dL — ABNORMAL HIGH (ref 70–99)
Glucose-Capillary: 246 mg/dL — ABNORMAL HIGH (ref 70–99)
Glucose-Capillary: 289 mg/dL — ABNORMAL HIGH (ref 70–99)
Glucose-Capillary: 333 mg/dL — ABNORMAL HIGH (ref 70–99)

## 2018-10-06 MED ORDER — INSULIN ASPART 100 UNIT/ML ~~LOC~~ SOLN
0.0000 [IU] | Freq: Three times a day (TID) | SUBCUTANEOUS | Status: DC
  Administered 2018-10-06 (×2): 5 [IU] via SUBCUTANEOUS
  Administered 2018-10-06: 8 [IU] via SUBCUTANEOUS
  Administered 2018-10-07: 3 [IU] via SUBCUTANEOUS
  Administered 2018-10-07: 8 [IU] via SUBCUTANEOUS
  Administered 2018-10-07 – 2018-10-09 (×5): 3 [IU] via SUBCUTANEOUS
  Administered 2018-10-09: 5 [IU] via SUBCUTANEOUS
  Administered 2018-10-10: 2 [IU] via SUBCUTANEOUS
  Administered 2018-10-10: 5 [IU] via SUBCUTANEOUS
  Administered 2018-10-11: 3 [IU] via SUBCUTANEOUS
  Administered 2018-10-11: 2 [IU] via SUBCUTANEOUS
  Administered 2018-10-11: 3 [IU] via SUBCUTANEOUS

## 2018-10-06 MED ORDER — INSULIN GLARGINE 100 UNIT/ML ~~LOC~~ SOLN
6.0000 [IU] | Freq: Every day | SUBCUTANEOUS | Status: DC
  Administered 2018-10-06: 6 [IU] via SUBCUTANEOUS
  Filled 2018-10-06 (×2): qty 0.06

## 2018-10-06 MED ORDER — AMLODIPINE BESYLATE 5 MG PO TABS
5.0000 mg | ORAL_TABLET | Freq: Every day | ORAL | Status: DC
  Administered 2018-10-06 – 2018-10-09 (×4): 5 mg via ORAL
  Filled 2018-10-06 (×4): qty 1

## 2018-10-06 MED ORDER — FUROSEMIDE 10 MG/ML IJ SOLN
20.0000 mg | Freq: Once | INTRAMUSCULAR | Status: AC
  Administered 2018-10-06: 20 mg via INTRAVENOUS
  Filled 2018-10-06: qty 2

## 2018-10-06 NOTE — Progress Notes (Signed)
Occupational Therapy Treatment Patient Details Name: Sabrina Baker MRN: [id number] DOB: 1940-11-29 Today's Date: 10/06/2018    History of present illness 78 y.o. female with PMHx of HTN, DM, CKD stage III, presented with shortness of breath and cough, found to have acute hypoxic respiratory failure due to ARDS in the setting of COVID-19 infection. ETT 3/30-4/15, stridor and vocal fold edema; reintubated 4/15-4/20.     OT comments  Pt progressing towards established OT goals and is highly motivated to participate in therapy. Upon arrival, pt upright in bed and eating lunch. Pt demonstrating increased performance with self feeding; continues to requires Min A for opening containers. Provided pt with cup with lid and handle to increase independence with drinks; pt demonstrated increased success and reports she likes cup. Pt performing lateral scoot to drop arm recliner with Mod A and significant time. Pt requiring five seated rest breaks during transfer due to fatigue. Continue to recommend dc to post-acute rehab and will continue to follow acutely as admitted.  SpO2 >93% on 2L, HR elevated to 126 with activity, and RR 20s.    Follow Up Recommendations  SNF;Supervision/Assistance - 24 hour    Equipment Recommendations  Other (comment)    Recommendations for Other Services PT consult;Speech consult    Precautions / Restrictions Precautions Precautions: Fall Precaution Comments: monitor VS Restrictions Weight Bearing Restrictions: No       Mobility Bed Mobility Overal bed mobility: Needs Assistance Bed Mobility: Supine to Sit     Supine to sit: Mod assist     General bed mobility comments: Pt bringing her BLes towards EOB. Provdiing Mod A to elevated trunk. Once in upright posture, pt able to maintain balance as she move hips towards EOB with Min A.   Transfers Overall transfer level: Needs assistance   Transfers: Lateral/Scoot Transfers          Lateral/Scoot Transfers:  Mod assist;From elevated surface General transfer comment: Pt performing lateral scoot with Mod A for manage hips with bed pad. Pt requiring five seated rest breaks due to significant fatigue.     Balance Overall balance assessment: Needs assistance Sitting-balance support: Bilateral upper extremity supported;Feet supported Sitting balance-Leahy Scale: Fair Sitting balance - Comments: Able to maintain sitting once stable in sitting at EOB                                   ADL either performed or assessed with clinical judgement   ADL Overall ADL's : Needs assistance/impaired Eating/Feeding: Minimal assistance;Bed level Eating/Feeding Details (indicate cue type and reason): Upon arrival, pt self feeding herself lunch (without built up handle). Provided pt with cup with lid and handle to optimize independence. Pt reports success with using cup                 Lower Body Dressing: Moderate assistance;Maximal assistance Lower Body Dressing Details (indicate cue type and reason): Pt donning her left sock by using figure four method at EOB. Pt fatigued quickly and required assistance to don right sock.  Toilet Transfer: Moderate assistance;Transfer board(lateral scoot to drop arm recliner) Toilet Transfer Details (indicate cue type and reason): Pt performing lateral scoot with Mod A to faciltiate hip movement. Pt requiring 5 seated rest breaks during transfer due to fatigue.          Functional mobility during ADLs: Moderate assistance(laterally scooting to recliner) General ADL Comments: Pt presenting with decreased cognition, balance,  strength, and activity tolerance.      Vision   Vision Assessment?: Vision impaired- to be further tested in functional context Additional Comments: Repeating "my eye sight isn't great right now. everything is blurry"   Perception     Praxis      Cognition Arousal/Alertness: Awake/alert Behavior During Therapy: WFL for tasks  assessed/performed Overall Cognitive Status: Impaired/Different from baseline Area of Impairment: Following commands;Attention;Memory;Safety/judgement;Awareness;Problem solving                   Current Attention Level: Sustained Memory: Decreased short-term memory Following Commands: Follows one step commands inconsistently;Follows one step commands with increased time Safety/Judgement: Decreased awareness of safety;Decreased awareness of deficits Awareness: Emergent Problem Solving: Slow processing;Decreased initiation;Requires verbal cues;Requires tactile cues;Difficulty sequencing General Comments: Pt follolwing cues with increased time. Poor attention and very internally distracted. Motivated to participate in therapy and get OOB to chair.         Exercises     Shoulder Instructions       General Comments SpO2 dropping to 86% on RA during activity. Placed back on 2L and pt return to >93%. HR elevating to 126 with activity. RR 20s.     Pertinent Vitals/ Pain       Pain Assessment: Faces Faces Pain Scale: Hurts a little bit Pain Location: R hand, has swelling in that area Pain Descriptors / Indicators: Sore Pain Intervention(s): Monitored during session;Repositioned  Home Living                                          Prior Functioning/Environment              Frequency  Min 3X/week        Progress Toward Goals  OT Goals(current goals can now be found in the care plan section)  Progress towards OT goals: Progressing toward goals  Acute Rehab OT Goals Patient Stated Goal: to get stronger OT Goal Formulation: With patient/family Time For Goal Achievement: 10/15/18 Potential to Achieve Goals: Good ADL Goals Pt Will Perform Grooming: with set-up;with supervision;sitting Pt Will Perform Lower Body Dressing: with mod assist;sit to/from stand;sitting/lateral leans Pt Will Transfer to Toilet: with mod assist;stand pivot transfer;bedside  commode Pt Will Perform Toileting - Clothing Manipulation and hygiene: with mod assist;sit to/from stand;sitting/lateral leans Additional ADL Goal #1: Pt will perform bed mobility with Min A in preparation for ADLs Additional ADL Goal #2: Pt willl sustain attention to ADL with Min cues  Plan Discharge plan remains appropriate    Co-evaluation                 AM-PAC OT "6 Clicks" Daily Activity     Outcome Measure   Help from another person eating meals?: A Little Help from another person taking care of personal grooming?: A Lot Help from another person toileting, which includes using toliet, bedpan, or urinal?: A Lot Help from another person bathing (including washing, rinsing, drying)?: A Lot Help from another person to put on and taking off regular upper body clothing?: A Lot Help from another person to put on and taking off regular lower body clothing?: A Lot 6 Click Score: 13    End of Session Equipment Utilized During Treatment: Oxygen(2L)  OT Visit Diagnosis: Unsteadiness on feet (R26.81);Other abnormalities of gait and mobility (R26.89);Muscle weakness (generalized) (M62.81);Other symptoms and signs involving cognitive function;Pain Pain - Right/Left: Right  Pain - part of body: Arm;Hand   Activity Tolerance Patient tolerated treatment well   Patient Left with call bell/phone within reach;in chair   Nurse Communication Need for lift equipment;Mobility status        Time: 1355-1448 OT Time Calculation (min): 53 min  Charges: OT General Charges $OT Visit: 1 Visit OT Treatments $Self Care/Home Management : 53-67 mins  [place], OTR/L Acute Rehab Pager: [phone] Office: [place] 10/06/2018, 3:01 PM

## 2018-10-06 NOTE — Progress Notes (Signed)
PROGRESS NOTE                                                                                                                                                                                                             Patient Demographics:    Sabrina Baker, is a 78 y.o. female, DOB - 11-25-1940, FOY:[id number]  Admit date - 09/09/2018   Admitting Physician Laurin Coder, MD  Outpatient Primary MD for the patient is [name], MD  LOS - 27   Chief Complaint  Patient presents with   Abdominal Pain   Cough       Brief Narrative    78 y.o.femalewith PMHx of HTN, DM, CKD stage III, presented with shortness of breath and cough, found to have acute hypoxic respiratory failure due to ARDS in the setting of COVID-19 infection.  3/30Admitted, intubated 3/21 R UE PICC 3/31>4/5 Plaquenil 4/13 transfer to [facility] 4/15 Extubated 4/15 Stridor-cord edema > reintubated 4/21 extubated    Subjective:    Sabrina Baker today reports he is feeling much better today, her fingers pain has almost resolved, bridle, appetite has much improved.   Assessment  & Plan :    Active Problems:   Acute respiratory failure with hypoxia (HCC)   ARDS (adult respiratory distress syndrome) (HCC)   COVID-19   Acute Hypoxic Resp. Failure due to Covid 19 ARDS -She  was initially extubated on 4/15, then she developed stridor  secondary to cord edema, requiring reintubation the same day, she was successfully extubated on 4/21. -Remains on 2 L nasal cannula this morning, she was encouraged again to get out of bed to chair, to do prone as much she tolerates/laying on the left side, use incentive spirometry . -Continue with IV Lasix, will give an extra dose of 20 mg IV Lasix this evening giving still some significant edema . -She did receive Plaquenil .   UTI -Urine culture growing both Citrobacter and enterococcus, she received total of 7 days of IV ampicillin and  Rocephin.  Abnormal thyroid function tests TSH 0.551 with free T4 4.3 and free T3 0.8 - no known history of thyroid disease - most c/w sick euthyroid state - do not feel supplementation is indicated - recheck thyroid fxn after pt is recovered from her critical illness   Fever -Patient had fever 101.7 on 10/05/2018,  so far septic work-up remains negative  Agitation/delirium Minimize sedation as able and follow , she is more awake alert and appropriate today after stopping her morning Seroquel  AKI on CKD stage III crt steadily improving and appears to be at her baseline , continue to monitor creatinine closely as on diuresis  Accelerated hypertension Overall on the higher side, will start on amlodipine  Paroxysmal atrial fibrillation NSR at this time -continue with Coreg, continue Eliquis  Macrocytic anemia no evidence of overt bleeding - Ferritin 467 - Folate 19.4 - B12 2058 -hemoglobin 7.8 yesterday, 11.2 today, most likely lab error, will repeat in a.m..  DM-2/Hypoglycemia A1c 7.3 - steroids stopped 4/22, with hypoglycemia yesterday, but CBG significantly uncontrolled today, most likely as she received steroids yesterday, and her appetite has improved, will increase her sliding scale to moderate and start on low-dose Lantus.  Gout attack -Right thumb, and second right digit, significantly improved after starting on colchicine, and given 1 dose of IV Solu-Medrol yesterday .  Hypernatremia -resolved   Code Status : Full code  Family Communication  : Left her daughter a voicemail on 10/06/2018  Disposition Plan  : will need SNF when stable  Barriers For Discharge : remains hypoxic  Consults  :  PCCM  Procedures  :  3/30>> ETT 3/21>> RUE PICC 4/10>> A line-out 4/15>>extubated 4/15>>ETT (re-intubated) 4/20>> Extubated DVT Prophylaxis  :  On Eliquis  Lab Results  Component Value Date   PLT 344 10/06/2018    Antibiotics  :    Anti-infectives (From admission,  onward)   Start     Dose/Rate Route Frequency Ordered Stop   09/27/18 1300  cefTRIAXone (ROCEPHIN) 2 g in sodium chloride 0.9 % 100 mL IVPB     2 g 200 mL/hr over 30 Minutes Intravenous Every 24 hours 09/27/18 0928 10/03/18 1015   09/26/18 1400  ampicillin (OMNIPEN) 1 g in sodium chloride 0.9 % 100 mL IVPB     1 g 300 mL/hr over 20 Minutes Intravenous Every 8 hours 09/26/18 1204 10/03/18 2128   09/26/18 1300  cefTRIAXone (ROCEPHIN) 1 g in sodium chloride 0.9 % 100 mL IVPB  Status:  Discontinued     1 g 200 mL/hr over 30 Minutes Intravenous Every 24 hours 09/26/18 1204 09/27/18 0928   09/11/18 2200  hydroxychloroquine (PLAQUENIL) tablet 200 mg  Status:  Discontinued     200 mg Oral 2 times daily 09/10/18 2309 09/10/18 2319   09/11/18 2200  hydroxychloroquine (PLAQUENIL) tablet 200 mg     200 mg Per Tube 2 times daily 09/10/18 2319 09/15/18 0851   09/10/18 2330  hydroxychloroquine (PLAQUENIL) tablet 400 mg     400 mg Per Tube 2 times daily 09/10/18 2319 09/11/18 0823   09/10/18 2315  hydroxychloroquine (PLAQUENIL) tablet 400 mg  Status:  Discontinued     400 mg Oral 2 times daily 09/10/18 2309 09/10/18 2319   09/10/18 1300  azithromycin (ZITHROMAX) 500 mg in sodium chloride 0.9 % 250 mL IVPB     500 mg 250 mL/hr over 60 Minutes Intravenous Every 24 hours 09/09/18 1342 09/15/18 1531   09/10/18 1200  cefTRIAXone (ROCEPHIN) 1 g in sodium chloride 0.9 % 100 mL IVPB  Status:  Discontinued     1 g 200 mL/hr over 30 Minutes Intravenous Every 24 hours 09/09/18 1342 09/14/18 1436   09/09/18 1200  cefTRIAXone (ROCEPHIN) 1 g in sodium chloride 0.9 % 100 mL IVPB     1 g 200 mL/hr over  30 Minutes Intravenous  Once 09/09/18 1148 09/09/18 1248   09/09/18 1200  azithromycin (ZITHROMAX) 500 mg in sodium chloride 0.9 % 250 mL IVPB     500 mg 250 mL/hr over 60 Minutes Intravenous  Once 09/09/18 1148 09/09/18 1351        Objective:   Vitals:   10/06/18 0000 10/06/18 0435 10/06/18 0600 10/06/18 0800   BP: (!) 110/95  (!) 149/69 (!) 169/79  Pulse: 86  87 84  Resp: (!) 23  (!) 31 (!) 21  Temp:  98.4 F (36.9 C) 98.8 F (37.1 C)   TempSrc:  Oral    SpO2: 90%  93% 94%  Weight:      Height:        Wt Readings from Last 3 Encounters:  06/21/17 70.3 kg  01/24/16 67.1 kg  03/16/15 76.2 kg     Intake/Output Summary (Last 24 hours) at 10/06/2018 1018 Last data filed at 10/06/2018 0837 Gross per 24 hour  Intake 241.2 ml  Output 1000 ml  Net -758.8 ml     Physical Exam  Awake Alert, Oriented X 3, more awake, conversant and pleasant and appropriate today Symmetrical Chest wall movement, Good air movement bilaterally, no wheezing RRR,No Gallops,Rubs or new Murmurs, No Parasternal Heave +ve B.Sounds, Abd Soft, No tenderness, No rebound - guarding or rigidity. No Cyanosis, Clubbing , has mild edema in upper and lower extremities , No new Rash or bruise     Data Review:    CBC Recent Labs  Lab 10/01/18 0235 10/02/18 0315 10/03/18 0205 10/05/18 0550 10/06/18 0430  WBC 16.0* 23.7* 15.4* 16.6* 17.2*  HGB 9.3* 10.1* 8.2* 7.8* 11.4*  HCT 30.7* 34.2* 28.2* 26.0* 37.8  PLT 605* 702* 535* 332 344  MCV 101.0* 100.9* 102.9* 103.6* 99.0  MCH 30.6 29.8 29.9 31.1 29.8  MCHC 30.3 29.5* 29.1* 30.0 30.2  RDW 14.3 15.4 15.9* 15.6* 15.1  LYMPHSABS  --   --  1.7  --   --   MONOABS  --   --  1.5*  --   --   EOSABS  --   --  0.9*  --   --   BASOSABS  --   --  0.0  --   --     Chemistries  Recent Labs  Lab 10/02/18 0315 10/03/18 0205 10/04/18 0140 10/05/18 0550 10/06/18 0430  NA 148* 145 146* 140 135  K 3.8 4.0 4.3 3.7 4.6  CL 112* 109 117* 107 101  CO2 26 28 26 27 26   GLUCOSE 112* 163* 196* 110* 305*  BUN 85* 74* 88* 30* 26*  CREATININE 1.70* 1.55* 1.05* 1.20* 1.26*  CALCIUM 8.1* 8.1* 9.3 7.6* 7.6*  AST  --  24  --   --   --   ALT  --  22  --   --   --   ALKPHOS  --  41  --   --   --   BILITOT  --  0.4  --   --   --     ------------------------------------------------------------------------------------------------------------------ No results for input(s): CHOL, HDL, LDLCALC, TRIG, CHOLHDL, LDLDIRECT in the last 72 hours.  Lab Results  Component Value Date   HGBA1C 7.3 (H) 04/19/2012   ------------------------------------------------------------------------------------------------------------------ No results for input(s): TSH, T4TOTAL, T3FREE, THYROIDAB in the last 72 hours.  Invalid input(s): FREET3 ------------------------------------------------------------------------------------------------------------------ No results for input(s): VITAMINB12, FOLATE, FERRITIN, TIBC, IRON, RETICCTPCT in the last 72 hours.  Coagulation profile No results for input(s): INR,  PROTIME in the last 168 hours.  No results for input(s): DDIMER in the last 72 hours.  Cardiac Enzymes No results for input(s): CKMB, TROPONINI, MYOGLOBIN in the last 168 hours.  Invalid input(s): CK ------------------------------------------------------------------------------------------------------------------    Component Value Date/Time   BNP 278.8 (H) 09/25/2018 0350    Inpatient Medications  Scheduled Meds:  apixaban  5 mg Oral BID   carvedilol  6.25 mg Oral BID WC   chlorhexidine  15 mL Mouth/Throat BID   Chlorhexidine Gluconate Cloth  6 each Topical Daily   colchicine  0.6 mg Oral Daily   furosemide  20 mg Intravenous Once   furosemide  40 mg Intravenous Daily   gabapentin  100 mg Oral QHS   guaiFENesin  1,200 mg Oral BID   insulin aspart  0-15 Units Subcutaneous TID WC   insulin glargine  6 Units Subcutaneous Daily   pantoprazole  40 mg Oral Daily   polyethylene glycol  17 g Oral Daily   QUEtiapine  25 mg Oral QHS   sodium chloride flush  10-40 mL Intracatheter Q12H   Continuous Infusions:  sodium chloride 10 mL/hr at 10/05/18 1810   PRN Meds:.sodium chloride, acetaminophen (TYLENOL) oral  liquid 160 mg/5 mL, albuterol, haloperidol lactate, hydrALAZINE, lip balm, Resource ThickenUp Clear, sodium chloride flush  Micro Results Recent Results (from the past 240 hour(s))  Culture, respiratory (non-expectorated)     Status: None   Collection Time: 09/28/18 11:06 AM  Result Value Ref Range Status   Specimen Description   Final    TRACHEAL ASPIRATE Performed at [facility] [address] [place], [place] [address]    Special Requests   Final    Normal Performed at [facility], [place] [address] [place], [place] [address]    Gram Stain   Final    RARE WBC PRESENT, PREDOMINANTLY PMN RARE GRAM POSITIVE COCCI RARE GRAM POSITIVE RODS    Culture   Final    FEW Consistent with normal respiratory flora. Performed at [facility], [place] [address] [place], [place] [address]    Report Status 10/01/2018 FINAL  Final  SARS Coronavirus 2 [facility] order, Performed in [place] hospital lab)     Status: Abnormal   Collection Time: 09/28/18 11:18 AM  Result Value Ref Range Status   SARS Coronavirus 2 POSITIVE (A) NEGATIVE Corrected    Comment: RESULT CALLED TO, READ BACK BY AND VERIFIED WITH: TETREAULT, H RN @1945  ON 09/28/2018 JACKSON,K Performed at [facility], [place] [address], [place], [place] [address] CORRECTED ON 04/18 AT 1954: PREVIOUSLY REPORTED AS POSITIVE CRITICAL RESULT CALLED TO, READ BACK BY AND VERIFIED WITH: TETREAULT, H RN @1945  ON 09/28/2018 JACKSON,K   Culture, blood (Routine X 2) w Reflex to ID Panel     Status: None (Preliminary result)   Collection Time: 10/04/18  1:00 PM  Result Value Ref Range Status   Specimen Description BLOOD LEFT HAND  Final   Special Requests   Final    BOTTLES DRAWN AEROBIC AND ANAEROBIC Blood Culture adequate volume   Culture   Final    NO GROWTH 2 DAYS Performed at [facility], [address] [place], [place] [address]    Report Status PENDING  Incomplete     Radiology Reports Dg Chest Port 1 View  Result Date: 10/04/2018 CLINICAL DATA:  Fever. EXAM: PORTABLE CHEST 1 VIEW COMPARISON:  Chest x-ray dated September 29, 2018. FINDINGS: Interval removal of the  endotracheal and enteric tubes. Unchanged right upper extremity PICC line. Stable cardiomediastinal silhouette. Persistent low lung volumes. Patchy bilateral airspace disease appears improved at the lung bases, but slightly worsened in the right upper lobe. No pneumothorax or large pleural effusion. No acute osseous abnormality. IMPRESSION: 1. Patchy bilateral airspace disease appears improved at the lung bases, but slightly worsened in the right upper lobe. Electronically Signed   By: Titus Dubin M.D.   On: 10/04/2018 12:05   Dg Chest Port 1 View  Result Date: 09/29/2018 CLINICAL DATA:  Acute hypoxia due to COVID 19 Fever 101.7 yesterday EXAM: PORTABLE CHEST 1 VIEW COMPARISON:  09/26/2018 FINDINGS: Endotracheal tube, NG tube, PICC line unchanged. Stable cardiac silhouette. Low lung volumes. Some improvement in pulmonary parenchymal opacities. IMPRESSION: 1. Mild improvement pulmonary parenchymal opacities. 2. Persistent low lung volumes. 3. Stable support apparatus. Electronically Signed   By: Suzy Bouchard M.D.   On: 09/29/2018 06:30   Dg Chest Port 1 View  Result Date: 09/26/2018 CLINICAL DATA:  Shortness of breath. EXAM: PORTABLE CHEST 1 VIEW COMPARISON:  One-view chest x-ray 09/25/2018 FINDINGS: The heart size is exaggerate by low lung volumes. The endotracheal tube has been pulled back slightly, now terminating 2.5 cm above the carina. A right-sided PICC line is stable. The NG tube courses off the inferior border of the film. Bilateral patchy interstitial and airspace disease is again noted. There is less right middle lobe consolidation. No definite effusions are present. IMPRESSION: 1. Improved aeration of the right middle lobe. 2. Persistent patchy interstitial and airspace disease  bilaterally is worrisome infection or edema. 3. Repositioning of endotracheal tube, now 2.5 cm above the carina. Electronically Signed   By: San Morelle M.D.   On: 09/26/2018 07:52   Dg Chest Port 1 View  Result Date: 09/25/2018 CLINICAL DATA:  78 year old female with a history of intubation EXAM: PORTABLE CHEST 1 VIEW COMPARISON:  09/24/2018 FINDINGS: Endotracheal tube now terminates at the carina. Tube may be withdrawn 6 cm-7 cm for better positioning. Right upper extremity PICC. Gastric tube terminates out of the field of view. Low lung volumes with similar appearance of reticulonodular opacity throughout with increasing airspace opacity in the right mid lung. Persistently low lung volumes.  No pneumothorax or pleural effusion. IMPRESSION: Endotracheal tube terminates at the carina and may be withdrawn 6 cm-7 cm for better positioning. Low lung volumes persist with mixed interstitial and airspace opacities bilaterally, with increasing airspace opacity in the right mid lung. Unchanged right upper extremity PICC and gastric tube. Electronically Signed   By: Corrie Mckusick D.O.   On: 09/25/2018 19:11   Dg Chest Port 1 View  Result Date: 09/24/2018 CLINICAL DATA:  Short of breath EXAM: PORTABLE CHEST 1 VIEW COMPARISON:  09/23/2018 FINDINGS: Normal heart size. Endotracheal tube, NG tube, and right PICC are stable. Patchy airspace opacities throughout both lungs are not significantly changed. No pneumothorax. IMPRESSION: Stable patchy airspace disease throughout both lungs. Electronically Signed   By: Marybelle Killings M.D.   On: 09/24/2018 07:41   Dg Chest Port 1 View  Result Date: 09/23/2018 CLINICAL DATA:  Respiratory failure. EXAM: PORTABLE CHEST 1 VIEW COMPARISON:  One-view chest x-ray 09/21/2018 FINDINGS: The heart size is normal. Endotracheal tube is stable. NG tube courses off the inferior border the film. Right-sided PICC line is stable and in satisfactory position. Bilateral interstitial and  airspace disease is similar the prior exam. There is improved aeration at the left base. Increased airspace opacities are noted in the  right middle and upper lobes. IMPRESSION: 1. Bilateral interstitial and airspace disease concerning for infection. There is slight increase on the right and some improvement on the left. 2. Support apparatus is stable. Electronically Signed   By: San Morelle M.D.   On: 09/23/2018 07:41   Dg Chest Port 1 View  Result Date: 09/21/2018 CLINICAL DATA:  Respiratory failure. EXAM: PORTABLE CHEST 1 VIEW COMPARISON:  Radiograph of September 21, 2018. FINDINGS: Stable cardiomediastinal silhouette. Endotracheal and nasogastric tubes are in grossly good position. Right-sided PICC line is unchanged. No pneumothorax or significant pleural effusion is noted. Stable bilateral lung opacities are noted concerning for pneumonia or possibly edema. Bony thorax is unremarkable. IMPRESSION: Stable support apparatus. Stable bilateral lung opacities as described above. Electronically Signed   By: Marijo Conception, M.D.   On: 09/21/2018 22:03   Dg Chest Port 1 View  Result Date: 09/21/2018 CLINICAL DATA:  Respiratory failure EXAM: PORTABLE CHEST 1 VIEW COMPARISON:  09/20/2018 FINDINGS: Endotracheal tube in good position. Right arm PICC tip in the proximal SVC unchanged. NG tube in the stomach. Mild improvement in bilateral airspace disease. Small left effusion. IMPRESSION: Endotracheal tube in good position Diffuse bilateral airspace disease with mild improvement. Electronically Signed   By: Franchot Gallo M.D.   On: 09/21/2018 08:10   Dg Chest Port 1 View  Result Date: 09/20/2018 CLINICAL DATA:  Respiratory failure.  Ventilator support. EXAM: PORTABLE CHEST 1 VIEW COMPARISON:  09/18/2018 FINDINGS: Endotracheal tube tip is 4 cm above the carina. Orogastric tube enters the abdomen. Bilateral patchy pulmonary infiltrates appear quite similar, with slightly more volume loss at the right lung  base. No new finding otherwise. IMPRESSION: Persistent patchy bilateral pulmonary infiltrates. Slight worsening of volume loss at the right lung base. Electronically Signed   By: Nelson Chimes M.D.   On: 09/20/2018 06:11   Dg Chest Port 1 View  Result Date: 09/18/2018 CLINICAL DATA:  ARDS, COVID+ Pt intubated on airborne and contact PPE: Gloves, gown, N95, goggles, bouffant. EXAM: PORTABLE CHEST - 1 VIEW COMPARISON:  09/16/2018 FINDINGS: Endotracheal tube, nasogastric tube, right arm PICC stable. Slight improvement in right lower lung airspace disease with patchy hazy infiltrates in the left mid and lower lung and right upper lung as before. Heart size and mediastinal contours are within normal limits. No effusion. No pneumothorax. Fixation hardware in the proximal right humerus. IMPRESSION: 1. Patchy bilateral airspace disease, with slight improvement in right lower lung opacities since prior study. 2. Support hardware stable in position. Electronically Signed   By: Lucrezia Europe M.D.   On: 09/18/2018 08:05   Dg Chest Port 1 View  Result Date: 09/16/2018 CLINICAL DATA:  Respiratory failure EXAM: PORTABLE CHEST 1 VIEW COMPARISON:  Yesterday FINDINGS: Endotracheal tube tip between the clavicular heads and carina. The orogastric tube at least reaches the stomach. Right-sided central line with tip at the SVC. No appreciable change in patchy bilateral interstitial and airspace opacity. Stable heart size. No evidence of air leak or pleural fluid. IMPRESSION: 1. Unchanged multifocal pneumonia. 2. Stable hardware positioning. Electronically Signed   By: Monte Fantasia M.D.   On: 09/16/2018 07:09   Dg Chest Port 1 View  Result Date: 09/15/2018 CLINICAL DATA:  Respiratory failure. Intubated patient. Follow-up exam. EXAM: PORTABLE CHEST 1 VIEW COMPARISON:  Prior studies, most recent 09/14/2018 FINDINGS: Cardiac silhouette is normal size.  No mediastinal or hilar masses. There are hazy bilateral airspace lung opacities  which are without change from the previous day's exam.  No new lung abnormalities. No convincing pleural effusion and no pneumothorax. Endotracheal tube, nasal/orogastric tube and right PICC are stable. IMPRESSION: 1. No change from previous day's study. 2. Persistent bilateral ground-glass lung opacities. 3. Stable well-positioned support apparatus. Electronically Signed   By: Lajean Manes M.D.   On: 09/15/2018 06:47   Dg Chest Port 1 View  Result Date: 09/14/2018 CLINICAL DATA:  Respiratory failure.  COVID-19. EXAM: PORTABLE CHEST 1 VIEW COMPARISON:  09/13/2018 and prior studies dating back to 09/09/2018. FINDINGS: Bilateral ground-glass lung opacities are without significant change from the prior exams. Atelectasis at the left lung base is stable. More confluent opacity at the right lung base is less prominent, which appears to be due to larger lung volumes on the current exam. No new lung abnormalities.  No pleural effusion or pneumothorax. Endotracheal tube, nasal/orogastric tube and right PICC are stable in well positioned. IMPRESSION: 1. No significant change from the previous day's study. 2. Persistent bilateral ground-glass lung opacities more confluent basilar opacities. 3. Stable well-positioned support apparatus. Electronically Signed   By: Lajean Manes M.D.   On: 09/14/2018 06:24   Dg Chest Port 1 View  Result Date: 09/13/2018 CLINICAL DATA:  Respiratory failure EXAM: PORTABLE CHEST 1 VIEW COMPARISON:  Yesterday FINDINGS: Endotracheal tube tip between the clavicular heads and carina. The orogastric tube has been advanced and now at least reaches the stomach-as confirmed on interval KUB. Right PICC with tip at the SVC. Patchy bilateral airspace disease. Borderline heart size. No effusion or pneumothorax. IMPRESSION: Stable hardware positioning and bilateral pneumonia. Electronically Signed   By: Monte Fantasia M.D.   On: 09/13/2018 06:08   Dg Chest Port 1 View  Result Date: 09/12/2018 CLINICAL  DATA:  History of endotracheal tube EXAM: PORTABLE CHEST 1 VIEW COMPARISON:  Yesterday FINDINGS: Endotracheal tube tip between the clavicular heads and carina. The orogastric tube tip is in the region of the GE junction. Right PICC with tip near the SVC origin. Patchy bilateral lung opacity with both interstitial and airspace appearance. No effusion or pneumothorax. Normal heart size. These results will be called to the ordering clinician or representative by the Radiologist Assistant, and communication documented in the PACS or zVision Dashboard. IMPRESSION: 1. Shortened orogastric tube with tip now at the GE junction. 2. Other hardware positioning is stable. 3. Bilateral lung opacity with progression. Electronically Signed   By: Monte Fantasia M.D.   On: 09/12/2018 07:14   Dg Chest Port 1 View  Result Date: 09/11/2018 CLINICAL DATA:  Respiratory failure EXAM: PORTABLE CHEST 1 VIEW COMPARISON:  09/10/2018 FINDINGS: Endotracheal tube and NG tube are stable. Interval placement of right PICC line with the tip in the SVC. Patchy bilateral airspace disease similar to prior study. Heart is normal size. No visible effusions or acute bony abnormality. IMPRESSION: Right PICC line tip in the SVC. Remainder of the support devices stable. Stable patchy bilateral airspace disease. Electronically Signed   By: Rolm Baptise M.D.   On: 09/11/2018 07:36   Dg Chest Port 1 View  Result Date: 09/10/2018 CLINICAL DATA:  Respiratory failure EXAM: PORTABLE CHEST 1 VIEW COMPARISON:  09/09/2018 FINDINGS: Support devices are stable. Heart is normal size. Patchy bilateral airspace disease again noted, not significantly changed. No visible effusions or acute bony abnormality. IMPRESSION: Patchy bilateral airspace disease, not significantly changed. Electronically Signed   By: Rolm Baptise M.D.   On: 09/10/2018 07:39   Dg Chest Port 1 View  Result Date: 09/09/2018 CLINICAL DATA:  Respiratory  failure. EXAM: PORTABLE CHEST 1 VIEW  COMPARISON:  Chest x-ray from same day at 1 a.m. FINDINGS: Interval placement of an endotracheal tube with the tip 3.9 cm above the carina. Enteric tube entering the stomach with the tip below the field of view. The heart size and mediastinal contours are within normal limits. Patchy asymmetric, hazy opacities in both lungs again noted, slightly more consolidative appearance in the left mid lung and right lower lobe. No pleural effusion or pneumothorax. No acute osseous abnormality. IMPRESSION: 1. Appropriately positioned endotracheal tube. 2. Patchy asymmetric airspace disease in both lungs again noted, with slightly more focal consolidative appearance in the left mid lung and right lower lobe. Findings are nonspecific, but concerning for atypical infection, including potential viral pneumonia. Electronically Signed   By: Titus Dubin M.D.   On: 09/09/2018 17:38   Dg Chest Port 1 View  Result Date: 09/09/2018 CLINICAL DATA:  Short of breath, cough.  Diarrhea. EXAM: PORTABLE CHEST 1 VIEW COMPARISON:  Chest radiograph 05/29/2012 FINDINGS: Normal cardiac silhouette. There is bilateral fine airspace disease in the upper lobes. No pleural fluid. No pneumothorax. No acute osseous abnormality. RIGHT shoulder internal fixation. IMPRESSION: Bilateral upper lobe airspace disease representing multifocal pneumonia versus pulmonary edema. Electronically Signed   By: Suzy Bouchard M.D.   On: 09/09/2018 11:27   Dg Abd Portable 1v  Result Date: 09/25/2018 CLINICAL DATA:  78 y/o  F; intubation, NG placement. EXAM: PORTABLE ABDOMEN - 1 VIEW COMPARISON:  09/12/2018 abdomen radiographs. FINDINGS: Enteric tube tip projects over the gastric body. Normal bowel gas pattern. Ill-defined patchy opacities in lung bases. No acute osseous abnormality is evident. IMPRESSION: Enteric tube tip projects over the gastric body. Normal bowel gas pattern. Electronically Signed   By: Kristine Garbe M.D.   On: 09/25/2018 19:19    Dg Abd Portable 1v  Result Date: 09/12/2018 CLINICAL DATA:  Orogastric tube placement. EXAM: PORTABLE ABDOMEN - 1 VIEW COMPARISON:  CT abdomen and pelvis June 24, 2018 FINDINGS: Nasogastric tube tip projects in gastric antrum. No intra-abdominal mass effect or pathologic calcifications. Included bowel gas pattern is nondilated and nonobstructive. Mild vascular calcifications. Interstitial and alveolar airspace opacities included lung bases better characterized on today's dedicated radiograph. Thoracolumbar levoscoliosis. IMPRESSION: 1. Nasogastric tube tip projects in gastric antrum. Electronically Signed   By: Elon Alas M.D.   On: 09/12/2018 22:13   [place] Ekg Site Rite  Result Date: 09/10/2018 If Site Rite image not attached, placement could not be confirmed due to current cardiac rhythm.   Phillips Climes M.D on 10/06/2018 at 10:18 AM  Between 7am to 7pm - Pager - [phone]  After 7pm go to [email or web address] - password [facility]  [facility] -  Office  [phone]

## 2018-10-07 LAB — CBC
HCT: 26.8 % — ABNORMAL LOW (ref 36.0–46.0)
Hemoglobin: 8.2 g/dL — ABNORMAL LOW (ref 12.0–15.0)
MCH: 30.9 pg (ref 26.0–34.0)
MCHC: 30.6 g/dL (ref 30.0–36.0)
MCV: 101.1 fL — ABNORMAL HIGH (ref 80.0–100.0)
Platelets: 347 10*3/uL (ref 150–400)
RBC: 2.65 MIL/uL — ABNORMAL LOW (ref 3.87–5.11)
RDW: 15.5 % (ref 11.5–15.5)
WBC: 14.5 10*3/uL — ABNORMAL HIGH (ref 4.0–10.5)
nRBC: 0 % (ref 0.0–0.2)

## 2018-10-07 LAB — BASIC METABOLIC PANEL
Anion gap: 6 (ref 5–15)
BUN: 23 mg/dL (ref 8–23)
CO2: 29 mmol/L (ref 22–32)
Calcium: 7.8 mg/dL — ABNORMAL LOW (ref 8.9–10.3)
Chloride: 106 mmol/L (ref 98–111)
Creatinine, Ser: 1.05 mg/dL — ABNORMAL HIGH (ref 0.44–1.00)
GFR calc Af Amer: 59 mL/min — ABNORMAL LOW (ref >60)
GFR calc non Af Amer: 51 mL/min — ABNORMAL LOW (ref >60)
Glucose, Bld: 199 mg/dL — ABNORMAL HIGH (ref 70–99)
Potassium: 4.2 mmol/L (ref 3.5–5.1)
Sodium: 141 mmol/L (ref 135–145)

## 2018-10-07 LAB — GLUCOSE, CAPILLARY
Glucose-Capillary: 319 mg/dL — ABNORMAL HIGH (ref 70–99)
Glucose-Capillary: 268 mg/dL — ABNORMAL HIGH (ref 70–99)
Glucose-Capillary: 157 mg/dL — ABNORMAL HIGH (ref 70–99)
Glucose-Capillary: 160 mg/dL — ABNORMAL HIGH (ref 70–99)
Glucose-Capillary: 258 mg/dL — ABNORMAL HIGH (ref 70–99)

## 2018-10-07 MED ORDER — FUROSEMIDE 10 MG/ML IJ SOLN
40.0000 mg | Freq: Two times a day (BID) | INTRAMUSCULAR | Status: DC
  Administered 2018-10-07 – 2018-10-08 (×2): 40 mg via INTRAVENOUS
  Filled 2018-10-07 (×2): qty 4

## 2018-10-07 MED ORDER — INSULIN GLARGINE 100 UNIT/ML ~~LOC~~ SOLN
10.0000 [IU] | Freq: Every day | SUBCUTANEOUS | Status: DC
  Administered 2018-10-07 – 2018-10-11 (×5): 10 [IU] via SUBCUTANEOUS
  Filled 2018-10-07 (×5): qty 0.1

## 2018-10-07 NOTE — Progress Notes (Signed)
OT Progress Note  Pt making good progress. Seen as co-treat with PT. Used the [place] to attempt standing. Pt able to pull to stand with +2 Mod A and use of Stedy. Transferred to [facility]. Pt sat on [facility] for increased length of time and began to feel poorly. BP 95/53. SpO2 91-97 throughout session on RA. Pt returned to bed with use of +3. Nsg assisted and made aware. Attempted to contact family - no answer. Will continue to follow acutely.    10/07/18 1755  OT Visit Information  Last OT Received On 10/07/18  Assistance Needed +2  PT/OT/SLP Co-Evaluation/Treatment Yes  Reason for Co-Treatment Complexity of the patient's impairments (multi-system involvement);For patient/therapist safety;To address functional/ADL transfers  OT goals addressed during session ADL's and self-care;Strengthening/ROM  History of Present Illness 78 y.o. female with PMHx of HTN, DM, CKD stage III, presented with shortness of breath and cough, found to have acute hypoxic respiratory failure due to ARDS in the setting of COVID-19 infection. ETT 3/30-4/15, stridor and vocal fold edema; reintubated 4/15-4/20.    Precautions  Precautions Fall  Precaution Comments monitor VS,has shoes to wear  Pain Assessment  Pain Assessment Faces  Faces Pain Scale 2  Pain Location R hand, has swelling in that area  Pain Descriptors / Indicators Sore  Pain Intervention(s) Limited activity within patient's tolerance  Cognition  Arousal/Alertness Awake/alert  Behavior During Therapy Impulsive;WFL for tasks assessed/performed  Overall Cognitive Status Impaired/Different from baseline  Area of Impairment Orientation;Attention;Safety/judgement;Awareness;Problem solving  Orientation Level Disoriented to;Time  Current Attention Level Sustained  Following Commands Follows one step commands consistently  Safety/Judgement Decreased awareness of safety;Decreased awareness of deficits  Awareness Emergent  Problem Solving Slow processing;Difficulty  sequencing  General Comments Very talkative; mentation improving  Upper Extremity Assessment  Upper Extremity Assessment RUE deficits/detail;LUE deficits/detail  RUE Deficits / Details ROM limitations at baseline; Thumb pain lessened adn improving. Attempting to use hand funcitonally; limitations in shoulder as well  RUE Coordination decreased fine motor;decreased gross motor  LUE Deficits / Details Uses as dominant extremity; poor inhand manipulation skills; has compensatory strategies at baseline  LUE Coordination decreased fine motor;decreased gross motor  Lower Extremity Assessment  Lower Extremity Assessment Defer to PT evaluation  ADL  Overall ADL's  Needs assistance/impaired  Eating/Feeding Minimal assistance;Cueing for safety;Sitting;With adaptive utensils  Eating/Feeding Details (indicate cue type and reason) Likes using cup  Toilet Transfer BSC;Maximal assistance;+2 for physical assistance (with Stedy)  Toileting- Clothing Manipulation and Hygiene Total assistance  Functional mobility during ADLs Maximal assistance;+2 for physical assistance;Cueing for safety;Cueing for sequencing  General ADL Comments Use of Stedy to stand and transfer to [facility]; Difficulty achieving full upright posture; Became fatigued and complained of not feeling well while on BSC. BP 95/53. +3 required to move pt back to bed using Stedy.   Bed Mobility  Overal bed mobility Needs Assistance  Supine to sit Mod assist;HOB elevated;+2 for safety/equipment  Sit to supine Max assist;+2 for physical assistance  Balance  Sitting balance-Leahy Scale Fair  Standing balance-Leahy Scale Poor  Standing balance comment stands briefly with STEDY.  Vision- Assessment  Additional Comments Has glasses in her room but states they are not hers  Transfers  Transfer via [place] to/from Stand  Sit to Stand Mod assist;Max assist;+2 physical assistance;+2 safety/equipment;From elevated surface    Lateral/Scoot Transfers Mod assist;From elevated surface  General transfer comment Cues for hand placement to reach for STEDY. Extra time to perform . Assist of 2  max assist to stand from bed instedy x 3 total. Cues for erect posture to place flaps.Stedy assisted to [facility] then  back to bed. Patient feeling weak per patient, BP 95/53 sats 90% on RA. Placed back on 2 liters.. Patient sat for a total of 20 minutes on bed edge and BSC.  OT - End of Session  Equipment Utilized During Treatment Oxygen (1L)  Activity Tolerance Patient tolerated treatment well  Patient left in bed;with call bell/phone within reach;with bed alarm set  Nurse Communication Other (comment)  OT Assessment/Plan  OT Plan Discharge plan remains appropriate  OT Visit Diagnosis Unsteadiness on feet (R26.81);Other abnormalities of gait and mobility (R26.89);Muscle weakness (generalized) (M62.81);Other symptoms and signs involving cognitive function;Pain  Pain - Right/Left Right  Pain - part of body Arm;Hand  OT Frequency (ACUTE ONLY) Min 3X/week  Recommendations for Other Services PT consult;Speech consult  Follow Up Recommendations SNF;Supervision/Assistance - 24 hour  OT Equipment Other (comment)  AM-PAC OT "6 Clicks" Daily Activity Outcome Measure (Version 2)  Help from another person eating meals? 3  Help from another person taking care of personal grooming? 2  Help from another person toileting, which includes using toliet, bedpan, or urinal? 2  Help from another person bathing (including washing, rinsing, drying)? 2  Help from another person to put on and taking off regular upper body clothing? 2  Help from another person to put on and taking off regular lower body clothing? 2  6 Click Score 13  OT Goal Progression  Progress towards OT goals Progressing toward goals  Acute Rehab OT Goals  Patient Stated Goal to get stronger  OT Goal Formulation With patient/family  Time For Goal Achievement 10/15/18  Potential to  Achieve Goals Good  ADL Goals  Pt Will Perform Grooming with set-up;with supervision;sitting  Pt Will Perform Lower Body Dressing with mod assist;sit to/from stand;sitting/lateral leans  Pt Will Transfer to Toilet with mod assist;stand pivot transfer;bedside commode  Pt Will Perform Toileting - Clothing Manipulation and hygiene with mod assist;sit to/from stand;sitting/lateral leans  Additional ADL Goal #1 Pt will perform bed mobility with Min A in preparation for ADLs  Additional ADL Goal #2 Pt willl sustain attention to ADL with Min cues  OT Time Calculation  OT Start Time (ACUTE ONLY) 1412  OT Stop Time (ACUTE ONLY) 1508  OT Time Calculation (min) 56 min  OT General Charges  $OT Visit 1 Visit  OT Treatments  $Self Care/Home Management  23-37 mins    10/07/18 1755  OT Visit Information  Last OT Received On 10/07/18  Assistance Needed +2  PT/OT/SLP Co-Evaluation/Treatment Yes  Reason for Co-Treatment Complexity of the patient's impairments (multi-system involvement);For patient/therapist safety;To address functional/ADL transfers  OT goals addressed during session ADL's and self-care;Strengthening/ROM  History of Present Illness 78 y.o. female with PMHx of HTN, DM, CKD stage III, presented with shortness of breath and cough, found to have acute hypoxic respiratory failure due to ARDS in the setting of COVID-19 infection. ETT 3/30-4/15, stridor and vocal fold edema; reintubated 4/15-4/20.    Precautions  Precautions Fall  Precaution Comments monitor VS,has shoes to wear  Pain Assessment  Pain Assessment Faces  Faces Pain Scale 2  Pain Location R hand, has swelling in that area  Pain Descriptors / Indicators Sore  Pain Intervention(s) Limited activity within patient's tolerance  Cognition  Arousal/Alertness Awake/alert  Behavior During Therapy Impulsive;WFL for tasks assessed/performed  Overall Cognitive Status Impaired/Different from baseline  Area of Impairment  Orientation;Attention;Safety/judgement;Awareness;Problem solving  Orientation Level Disoriented to;Time  Current Attention Level Sustained  Following Commands Follows one step commands consistently  Safety/Judgement Decreased awareness of safety;Decreased awareness of deficits  Awareness Emergent  Problem Solving Slow processing;Difficulty sequencing  General Comments Very talkative; mentation improving  Upper Extremity Assessment  Upper Extremity Assessment RUE deficits/detail;LUE deficits/detail  RUE Deficits / Details ROM limitations at baseline; Thumb pain lessened adn improving. Attempting to use hand funcitonally; limitations in shoulder as well  RUE Coordination decreased fine motor;decreased gross motor  LUE Deficits / Details Uses as dominant extremity; poor inhand manipulation skills; has compensatory strategies at baseline  LUE Coordination decreased fine motor;decreased gross motor  Lower Extremity Assessment  Lower Extremity Assessment Defer to PT evaluation  ADL  Overall ADL's  Needs assistance/impaired  Eating/Feeding Minimal assistance;Cueing for safety;Sitting;With adaptive utensils  Eating/Feeding Details (indicate cue type and reason) Likes using cup  Toilet Transfer BSC;Maximal assistance;+2 for physical assistance (with Stedy)  Toileting- Clothing Manipulation and Hygiene Total assistance  Functional mobility during ADLs Maximal assistance;+2 for physical assistance;Cueing for safety;Cueing for sequencing  General ADL Comments Use of Stedy to stand and transfer to [facility]; Difficulty achieving full upright posture; Became fatigued and complained of not feeling well while on BSC. BP 95/53. +3 required to move pt back to bed using Stedy.   Bed Mobility  Overal bed mobility Needs Assistance  Supine to sit Mod assist;HOB elevated;+2 for safety/equipment  Sit to supine Max assist;+2 for physical assistance  Balance  Sitting balance-Leahy Scale Fair  Standing balance-Leahy  Scale Poor  Standing balance comment stands briefly with STEDY.  Vision- Assessment  Additional Comments Has glasses in her room but states they are not hers  Transfers  Transfer via [place] to/from Stand  Sit to Stand Mod assist;Max assist;+2 physical assistance;+2 safety/equipment;From elevated surface   Lateral/Scoot Transfers Mod assist;From elevated surface  General transfer comment Cues for hand placement to reach for STEDY. Extra time to perform . Assist of 2 max assist to stand from bed instedy x 3 total. Cues for erect posture to place flaps.Stedy assisted to [facility] then  back to bed. Patient feeling weak per patient, BP 95/53 sats 90% on RA. Placed back on 2 liters.. Patient sat for a total of 20 minutes on bed edge and BSC.  OT - End of Session  Equipment Utilized During Treatment Oxygen (1L)  Activity Tolerance Patient tolerated treatment well  Patient left in bed;with call bell/phone within reach;with bed alarm set  Nurse Communication Other (comment)  OT Assessment/Plan  OT Plan Discharge plan remains appropriate  OT Visit Diagnosis Unsteadiness on feet (R26.81);Other abnormalities of gait and mobility (R26.89);Muscle weakness (generalized) (M62.81);Other symptoms and signs involving cognitive function;Pain  Pain - Right/Left Right  Pain - part of body Arm;Hand  OT Frequency (ACUTE ONLY) Min 3X/week  Recommendations for Other Services PT consult;Speech consult  Follow Up Recommendations SNF;Supervision/Assistance - 24 hour  OT Equipment Other (comment)  AM-PAC OT "6 Clicks" Daily Activity Outcome Measure (Version 2)  Help from another person eating meals? 3  Help from another person taking care of personal grooming? 2  Help from another person toileting, which includes using toliet, bedpan, or urinal? 2  Help from another person bathing (including washing, rinsing, drying)? 2  Help from another person to put on and taking off regular upper body  clothing? 2  Help from another person to put on and taking off regular lower body clothing? 2  6 Click Score 13  OT Goal Progression  Progress towards OT goals Progressing toward goals  Acute Rehab OT Goals  Patient Stated Goal to get stronger  OT Goal Formulation With patient/family  Time For Goal Achievement 10/15/18  Potential to Achieve Goals Good  ADL Goals  Pt Will Perform Grooming with set-up;with supervision;sitting  Pt Will Perform Lower Body Dressing with mod assist;sit to/from stand;sitting/lateral leans  Pt Will Transfer to Toilet with mod assist;stand pivot transfer;bedside commode  Pt Will Perform Toileting - Clothing Manipulation and hygiene with mod assist;sit to/from stand;sitting/lateral leans  Additional ADL Goal #1 Pt will perform bed mobility with Min A in preparation for ADLs  Additional ADL Goal #2 Pt willl sustain attention to ADL with Min cues  OT Time Calculation  OT Start Time (ACUTE ONLY) 1412  OT Stop Time (ACUTE ONLY) 1508  OT Time Calculation (min) 56 min  OT General Charges  $OT Visit 1 Visit  OT Treatments  $Self Care/Home Management  23-37 mins  Maurie Boettcher, OT/L   Acute OT Clinical Specialist [place] Pager [phone] Office [phone]

## 2018-10-07 NOTE — Progress Notes (Signed)
Physical Therapy Treatment Patient Details Name: HENRETTER PIEKARSKI MRN: [id number] DOB: 1940/09/05 Today's Date: 10/07/2018    History of Present Illness 78 y.o. female with PMHx of HTN, DM, CKD stage III, presented with shortness of breath and cough, found to have acute hypoxic respiratory failure due to ARDS in the setting of COVID-19 infection. ETT 3/30-4/15, stridor and vocal fold edema; reintubated 4/15-4/20.      PT Comments    The patient is talkative, eating lunch. Patient assisted to [facility] utilizing stedy, assist to stand from bed and [facility] mod/max assist. Patient complained of feeling weak as sat on BSC. BP 93/53, O2 sats 90-94% on RA. Patient  Off of Oxygen throughout treatment. Replaced on 1 L [place]. RN aware of BP. Continue PT efforts.  Follow Up Recommendations  SNF     Equipment Recommendations  None recommended by PT    Recommendations for Other Services       Precautions / Restrictions Precautions Precautions: Fall Precaution Comments: monitor VS,has shoes to wear    Mobility  Bed Mobility   Bed Mobility: Supine to Sit;Sit to Supine     Supine to sit: Mod assist;HOB elevated Sit to supine: Max assist;+2 for physical assistance;+2 for safety/equipment   General bed mobility comments: patient initiated legs to bed edge, min assist to complete, mod assist with t with trunk, patient scooted to bed edge with multimodal cues.. Patient appeared weaker for assisting back into bed so required 2 mod assist.  Transfers Overall transfer level: Needs assistance Equipment used: Ambulation equipment used Transfers: Sit to/from Stand Sit to Stand: Mod assist;Max assist;+2 physical assistance;+2 safety/equipment;From elevated surface         General transfer comment: Cues for hand placement to reach for STEDY. Extra time to perform . Assist of 2 max assist to stand from bed instedy x 3 total. Cues for erect posture to place flaps.Stedy assisted to [facility] then  back to bed. Patient  feeling weak per patient, BP 95/53 sats 90% on RA. Placed back on 1 liters.. Patient sat for a total of 20 minutes on bed edge and BSC.  Ambulation/Gait                 Stairs             Wheelchair Mobility    Modified Rankin (Stroke Patients Only)       Balance Overall balance assessment: Needs assistance Sitting-balance support: Bilateral upper extremity supported;Feet supported Sitting balance-Leahy Scale: Fair Sitting balance - Comments: Able to maintain sitting once stable in sitting at EOB       Standing balance comment: stands briefly with STEDY.                            Cognition Arousal/Alertness: Awake/alert Behavior During Therapy: WFL for tasks assessed/performed Overall Cognitive Status: Impaired/Different from baseline Area of Impairment: Following commands;Attention;Memory;Safety/judgement;Awareness;Problem solving                   Current Attention Level: Sustained           General Comments: patient  very talkative, oriented  to hospital, stated almost  MAY.      Exercises      General Comments        Pertinent Vitals/Pain Pain Assessment: No/denies pain Faces Pain Scale: Hurts a little bit Pain Location: R hand, has swelling in that area Pain Descriptors / Indicators: Sore Pain Intervention(s): Monitored during  session    Home Living                      Prior Function            PT Goals (current goals can now be found in the care plan section) Progress towards PT goals: Progressing toward goals    Frequency    Min 3X/week      PT Plan Current plan remains appropriate    Co-evaluation PT/OT/SLP Co-Evaluation/Treatment: Yes Reason for Co-Treatment: Complexity of the patient's impairments (multi-system involvement);For patient/therapist safety PT goals addressed during session: Mobility/safety with mobility OT goals addressed during session: ADL's and self-care      AM-PAC  PT "6 Clicks" Mobility   Outcome Measure  Help needed turning from your back to your side while in a flat bed without using bedrails?: A Lot Help needed moving from lying on your back to sitting on the side of a flat bed without using bedrails?: Total Help needed moving to and from a bed to a chair (including a wheelchair)?: Total Help needed standing up from a chair using your arms (e.g., wheelchair or bedside chair)?: Total Help needed to walk in hospital room?: Total Help needed climbing 3-5 steps with a railing? : Total 6 Click Score: 7    End of Session Equipment Utilized During Treatment: Gait belt Activity Tolerance: Patient limited by fatigue Patient left: in bed;with call bell/phone within reach;with nursing/sitter in room Nurse Communication: Mobility status;Need for lift equipment PT Visit Diagnosis: Muscle weakness (generalized) (M62.81);Difficulty in walking, not elsewhere classified (R26.2)     Time: 3005-1102 PT Time Calculation (min) (ACUTE ONLY): 52 min  Charges:  $Therapeutic Activity: 23-37 mins                     Tresa Endo PT Acute Rehabilitation Services Pager [phone] Office [phone]    Claretha Cooper 10/07/2018, 4:25 PM

## 2018-10-07 NOTE — Progress Notes (Signed)
OT Progress Note  Focus of session on increasing independence with self feeding. Pt using adapted cup after set up. Using compensatory strategies to manipulate utensils. B hand pain improved. Will return to complete co-treat with PT.     10/07/18 1700  OT Visit Information  Last OT Received On 10/07/18  Assistance Needed +2  PT/OT/SLP Co-Evaluation/Treatment Yes  Reason for Co-Treatment Complexity of the patient's impairments (multi-system involvement);For patient/therapist safety;To address functional/ADL transfers  OT goals addressed during session ADL's and self-care;Strengthening/ROM  History of Present Illness 78 y.o. female with PMHx of HTN, DM, CKD stage III, presented with shortness of breath and cough, found to have acute hypoxic respiratory failure due to ARDS in the setting of COVID-19 infection. ETT 3/30-4/15, stridor and vocal fold edema; reintubated 4/15-4/20.    Precautions  Precautions Fall  Pain Assessment  Pain Assessment Faces  Faces Pain Scale 2  Pain Location R hand, has swelling in that area  Pain Descriptors / Indicators Sore  Pain Intervention(s) Limited activity within patient's tolerance  Cognition  Arousal/Alertness Awake/alert  Behavior During Therapy Impulsive;WFL for tasks assessed/performed  Overall Cognitive Status Impaired/Different from baseline  Area of Impairment Orientation;Attention;Safety/judgement;Awareness;Problem solving  Orientation Level Disoriented to;Time  Current Attention Level Sustained  Following Commands Follows one step commands consistently  Safety/Judgement Decreased awareness of safety;Decreased awareness of deficits  Awareness Emergent  Problem Solving Slow processing;Difficulty sequencing  General Comments Very talkative; mentation improving  Upper Extremity Assessment  Upper Extremity Assessment Generalized weakness  RUE Deficits / Details ROM limitations at baseline; Thumb pain lessened adn improving. Attempting to use hand  funcitonally; limitations in shoulder as well  RUE Coordination decreased fine motor;decreased gross motor  LUE Deficits / Details Uses as dominant extremity; poor inhand manipulation skills; has compensatory strategies at baseline  LUE Coordination decreased fine motor;decreased gross motor  Lower Extremity Assessment  Lower Extremity Assessment Defer to PT evaluation  ADL  Overall ADL's  Needs assistance/impaired  Eating/Feeding Minimal assistance;Cueing for safety;Sitting;With adaptive utensils  Eating/Feeding Details (indicate cue type and reason) Likes using cup  [employer]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)  OT - End of Session  Activity Tolerance Patient tolerated treatment well  Patient left in bed;with call bell/phone within reach;with bed alarm set  Nurse Communication Other (comment) (AE for self feeding)  OT Assessment/Plan  OT Plan Discharge plan remains appropriate  OT Visit Diagnosis Unsteadiness on feet (R26.81);Other abnormalities of gait and mobility (R26.89);Muscle weakness (generalized) (M62.81);Other symptoms and signs involving cognitive function;Pain  Pain - Right/Left Right  Pain - part of body Arm;Hand  OT Frequency (ACUTE ONLY) Min 3X/week  Recommendations for Other Services PT consult;Speech consult  Follow Up Recommendations SNF;Supervision/Assistance - 24 hour  OT Equipment Other (comment)  AM-PAC OT "6 Clicks" Daily Activity Outcome Measure (Version 2)  Help from another person eating meals? 3  Help from another person taking care of personal grooming? 2  Help from another person toileting, which includes using toliet, bedpan, or urinal? 2  Help from another person bathing (including washing, rinsing, drying)? 2  Help from another person to put on and taking off regular upper body clothing? 2  Help from another person to put on and taking off regular lower body clothing? 2  6 Click Score 13  OT Goal Progression  Progress towards OT goals Progressing toward  goals  Acute Rehab OT Goals  Patient Stated Goal to get stronger  OT Goal Formulation With patient/family  Time For Goal Achievement 10/15/18  Potential  to Achieve Goals Good  ADL Goals  Pt Will Perform Grooming with set-up;with supervision;sitting  Pt Will Perform Lower Body Dressing with mod assist;sit to/from stand;sitting/lateral leans  Pt Will Transfer to Toilet with mod assist;stand pivot transfer;bedside commode  Pt Will Perform Toileting - Clothing Manipulation and hygiene with mod assist;sit to/from stand;sitting/lateral leans  Additional ADL Goal #1 Pt will perform bed mobility with Min A in preparation for ADLs  Additional ADL Goal #2 Pt willl sustain attention to ADL with Min cues  OT Time Calculation  OT Start Time (ACUTE ONLY) 1335  OT Stop Time (ACUTE ONLY) 1348  OT Time Calculation (min) 13 min  OT General Charges  $OT Visit 1 Visit  OT Treatments  $Self Care/Home Management  8-22 mins  Maurie Boettcher, OT/L   Acute OT Clinical Specialist Acute Rehabilitation Services Pager [phone] Office [phone]

## 2018-10-07 NOTE — Progress Notes (Signed)
Inpatient Diabetes Program Recommendations  AACE/ADA: New Consensus Statement on Inpatient Glycemic Control  Target Ranges:  Prepandial:   less than 140 mg/dL      Peak postprandial:   less than 180 mg/dL (1-2 hours)      Critically ill patients:  140 - 180 mg/dL   Results for Sabrina Baker, Sabrina Baker (MRN [id number]) as of 10/07/2018 09:52  Ref. Range 10/06/2018 08:03 10/06/2018 11:48 10/06/2018 17:37 10/06/2018 19:57 10/07/2018 07:59  Glucose-Capillary Latest Ref Range: 70 - 99 mg/dL 289 (H) 213 (H) 246 (H) 268 (H) 157 (H)   Review of Glycemic Control  Current orders for Inpatient glycemic control: Lantus 10 units daily, Novolog 0-15 units TID with meals  Inpatient Diabetes Program Recommendations:   Insulin - Basal: Noted Lantus was increased from 8 to 10 units daily.  Insulin-Correction: Please consider ordering Novolog 0-5 units QHS for bedtime correction.  Insulin - Meal Coverage: If post prandial glucose is consistently greater than 180 mg/dl, please consider ordering Novolog 3 units TID with meals for meal coverage if patient eats at least 50% of meals.  Thanks, Barnie Alderman, RN, MSN, CDE Diabetes Coordinator Inpatient Diabetes Program [phone] (Team Pager from 8am to 5pm)

## 2018-10-07 NOTE — Progress Notes (Signed)
  Speech Language Pathology Treatment: Dysphagia  Patient Details Name: Sabrina Baker MRN: [id number] DOB: 01-04-1941 Today's Date: 10/07/2018 Time: 5638-7564 SLP Time Calculation (min) (ACUTE ONLY): 23 min  Assessment / Plan / Recommendation Clinical Impression  Pt demonstrated clinical improvements today - VS remained stable throughout session; no swallow/respiratory dyssynchrony noted.  Voice is stronger/improved quality.  Pt consumed trials of thin liquids, taking in three oz with no s/s of aspiration; no cough; no wet phonation post-swallow. No fever; BS diminished. Recommend advancing liquids from nectar to thin.  Assist with meals/self-feeding.  SLP will continue to follow for swallow/cognition. D/W RN.      HPI HPI: Patient is a 78 y.o. female with PMHx of HTN, DM, CKD stage III, presented with shortness of breath and cough, found to have acute hypoxic respiratory failure due to ARDS in the setting of COVID-19 infection. ETT 3/30-4/15, stridor and vocal fold edema; reintubated 4/15-4/20.       SLP Plan  Continue with current plan of care       Recommendations  Diet recommendations: Dysphagia 1 (puree);Thin liquid Liquids provided via: Straw Medication Administration: Whole meds with puree Supervision: Staff to assist with self feeding Compensations: Minimize environmental distractions;Slow rate;Small sips/bites Postural Changes and/or Swallow Maneuvers: Seated upright 90 degrees;Upright 30-60 min after meal                Oral Care Recommendations: Oral care BID SLP Visit Diagnosis: Dysphagia, unspecified (R13.10) Plan: Continue with current plan of care       GO               Sabrina Baker, Sabrina Baker CCC/SLP Acute Rehabilitation Services Office number [phone] [place] phone: [phone]  Assunta Curtis 10/07/2018, 4:14 PM

## 2018-10-07 NOTE — Progress Notes (Signed)
PROGRESS NOTE                                                                                                                                                                                                             Patient Demographics:    Sabrina Baker, is a 78 y.o. female, DOB - 29-Jun-1940, TGY:[id number]  Admit date - 09/09/2018   Admitting Physician Laurin Coder, MD  Outpatient Primary MD for the patient is [name], MD  LOS - 36   Chief Complaint  Patient presents with   Abdominal Pain   Cough       Brief Narrative    78 y.o.femalewith PMHx of HTN, DM, CKD stage III, presented with shortness of breath and cough, found to have acute hypoxic respiratory failure due to ARDS in the setting of COVID-19 infection.  3/30Admitted, intubated 3/21 R UE PICC 3/31>4/5 Plaquenil 4/13 transfer to [facility] 4/15 Extubated 4/15 Stridor-cord edema > reintubated 4/21 extubated    Subjective:    Sabrina Baker today denies any fever, chest pain or cough , denies dyspnea as well, but report generalized weakness.    Assessment  & Plan :    Active Problems:   Acute respiratory failure with hypoxia (HCC)   ARDS (adult respiratory distress syndrome) (HCC)   COVID-19   Acute Hypoxic Resp. Failure due to Covid 19 ARDS -She  was initially extubated on 4/15, then she developed stridor  secondary to cord edema, requiring reintubation the same day, she was successfully extubated on 4/21. -Were able to wean to 1 L nasal cannula today , discussed with her, she was encouraged to use incentive spirometry, out of bed to chair, continue with prone as tolerated . -Renal function remained stable on diuresis, she is -1.6 L over last 24 hours, I will increase her Lasix to 40 mg IV twice daily. -She did receive Plaquenil .   UTI -Urine culture growing both Citrobacter and enterococcus, she received total of 7 days of IV ampicillin and  Rocephin.  Abnormal thyroid function tests TSH 0.551 with free T4 4.3 and free T3 0.8 - no known history of thyroid disease - most c/w sick euthyroid state - do not feel supplementation is indicated - recheck thyroid fxn after pt is recovered from her critical illness   Fever -Patient had fever 101.7 on 10/05/2018,  so far septic work-up remains negative, with no recurrence of fever  Agitation/delirium Minimize sedation as able and follow , patient much improved after holding daytime Seroquel .  AKI on CKD stage III crt steadily improving and appears to be at her baseline , continue to monitor creatinine closely as on diuresis  Accelerated hypertension Overall on the higher side, will start on amlodipine  Paroxysmal atrial fibrillation NSR at this time -continue with Coreg, continue Eliquis  Macrocytic anemia no evidence of overt bleeding - Ferritin 467 - Folate 19.4 - B12 2058  -Hemoglobin is stable at 8.2  DM-2/Hypoglycemia A1c 7.3 - steroids stopped 4/22, with hypoglycemia yesterday, but CBG significantly uncontrolled today, most likely as she received steroids yesterday, and her appetite has improved, will increase her sliding scale to moderate and start on low-dose Lantus.  Gout attack -Right thumb, and second left digit, significantly improved after starting on colchicine, receiving 1 dose of IV Solu-Medrol   Hypernatremia -resolved   Code Status : Full code  Family Communication  : D/W daughter via phone 10/06/2018  Disposition Plan  : will need SNF when stable  Barriers For Discharge : remains hypoxic  Consults  :  PCCM  Procedures  :  3/30>> ETT 3/21>> RUE PICC 4/10>> A line-out 4/15>>extubated 4/15>>ETT (re-intubated) 4/20>> Extubated DVT Prophylaxis  :  On Eliquis  Lab Results  Component Value Date   PLT 347 10/07/2018    Antibiotics  :    Anti-infectives (From admission, onward)   Start     Dose/Rate Route Frequency Ordered Stop   09/27/18  1300  cefTRIAXone (ROCEPHIN) 2 g in sodium chloride 0.9 % 100 mL IVPB     2 g 200 mL/hr over 30 Minutes Intravenous Every 24 hours 09/27/18 0928 10/03/18 1015   09/26/18 1400  ampicillin (OMNIPEN) 1 g in sodium chloride 0.9 % 100 mL IVPB     1 g 300 mL/hr over 20 Minutes Intravenous Every 8 hours 09/26/18 1204 10/03/18 2128   09/26/18 1300  cefTRIAXone (ROCEPHIN) 1 g in sodium chloride 0.9 % 100 mL IVPB  Status:  Discontinued     1 g 200 mL/hr over 30 Minutes Intravenous Every 24 hours 09/26/18 1204 09/27/18 0928   09/11/18 2200  hydroxychloroquine (PLAQUENIL) tablet 200 mg  Status:  Discontinued     200 mg Oral 2 times daily 09/10/18 2309 09/10/18 2319   09/11/18 2200  hydroxychloroquine (PLAQUENIL) tablet 200 mg     200 mg Per Tube 2 times daily 09/10/18 2319 09/15/18 0851   09/10/18 2330  hydroxychloroquine (PLAQUENIL) tablet 400 mg     400 mg Per Tube 2 times daily 09/10/18 2319 09/11/18 0823   09/10/18 2315  hydroxychloroquine (PLAQUENIL) tablet 400 mg  Status:  Discontinued     400 mg Oral 2 times daily 09/10/18 2309 09/10/18 2319   09/10/18 1300  azithromycin (ZITHROMAX) 500 mg in sodium chloride 0.9 % 250 mL IVPB     500 mg 250 mL/hr over 60 Minutes Intravenous Every 24 hours 09/09/18 1342 09/15/18 1531   09/10/18 1200  cefTRIAXone (ROCEPHIN) 1 g in sodium chloride 0.9 % 100 mL IVPB  Status:  Discontinued     1 g 200 mL/hr over 30 Minutes Intravenous Every 24 hours 09/09/18 1342 09/14/18 1436   09/09/18 1200  cefTRIAXone (ROCEPHIN) 1 g in sodium chloride 0.9 % 100 mL IVPB     1 g 200 mL/hr over 30 Minutes Intravenous  Once 09/09/18 1148 09/09/18 1248  09/09/18 1200  azithromycin (ZITHROMAX) 500 mg in sodium chloride 0.9 % 250 mL IVPB     500 mg 250 mL/hr over 60 Minutes Intravenous  Once 09/09/18 1148 09/09/18 1351        Objective:   Vitals:   10/07/18 0900 10/07/18 1000 10/07/18 1100 10/07/18 1200  BP: (!) 121/55 (!) 129/54 (!) 110/56 (!) 119/95  Pulse: 89 87 89 84   Resp: 18 18 16 20   Temp:      TempSrc:      SpO2: (!) 87% 94% 94% 92%  Height:        Wt Readings from Last 3 Encounters:  06/21/17 70.3 kg  01/24/16 67.1 kg  03/16/15 76.2 kg     Intake/Output Summary (Last 24 hours) at 10/07/2018 1240 Last data filed at 10/07/2018 0352 Gross per 24 hour  Intake --  Output 1000 ml  Net -1000 ml     Physical Exam  Awake Alert, Oriented X 3, extremely frail Symmetrical Chest wall movement, Good air movement bilaterally, CTAB RRR,No Gallops,Rubs or new Murmurs, No Parasternal Heave +ve B.Sounds, Abd Soft, No tenderness, No rebound - guarding or rigidity. No Cyanosis, Clubbing or edema, No new Rash or bruise , has mild edema in upper and lower extremities , tenderness has resolved   Data Review:    CBC Recent Labs  Lab 10/02/18 0315 10/03/18 0205 10/05/18 0550 10/06/18 0430 10/07/18 0447  WBC 23.7* 15.4* 16.6* 17.2* 14.5*  HGB 10.1* 8.2* 7.8* 11.4* 8.2*  HCT 34.2* 28.2* 26.0* 37.8 26.8*  PLT 702* 535* 332 344 347  MCV 100.9* 102.9* 103.6* 99.0 101.1*  MCH 29.8 29.9 31.1 29.8 30.9  MCHC 29.5* 29.1* 30.0 30.2 30.6  RDW 15.4 15.9* 15.6* 15.1 15.5  LYMPHSABS  --  1.7  --   --   --   MONOABS  --  1.5*  --   --   --   EOSABS  --  0.9*  --   --   --   BASOSABS  --  0.0  --   --   --     Chemistries  Recent Labs  Lab 10/03/18 0205 10/04/18 0140 10/05/18 0550 10/06/18 0430 10/07/18 0447  NA 145 146* 140 135 141  K 4.0 4.3 3.7 4.6 4.2  CL 109 117* 107 101 106  CO2 28 26 27 26 29   GLUCOSE 163* 196* 110* 305* 199*  BUN 74* 88* 30* 26* 23  CREATININE 1.55* 1.05* 1.20* 1.26* 1.05*  CALCIUM 8.1* 9.3 7.6* 7.6* 7.8*  AST 24  --   --   --   --   ALT 22  --   --   --   --   ALKPHOS 41  --   --   --   --   BILITOT 0.4  --   --   --   --    ------------------------------------------------------------------------------------------------------------------ No results for input(s): CHOL, HDL, LDLCALC, TRIG, CHOLHDL, LDLDIRECT in the  last 72 hours.  Lab Results  Component Value Date   HGBA1C 7.3 (H) 04/19/2012   ------------------------------------------------------------------------------------------------------------------ No results for input(s): TSH, T4TOTAL, T3FREE, THYROIDAB in the last 72 hours.  Invalid input(s): FREET3 ------------------------------------------------------------------------------------------------------------------ No results for input(s): VITAMINB12, FOLATE, FERRITIN, TIBC, IRON, RETICCTPCT in the last 72 hours.  Coagulation profile No results for input(s): INR, PROTIME in the last 168 hours.  No results for input(s): DDIMER in the last 72 hours.  Cardiac Enzymes No results for input(s): CKMB, TROPONINI, MYOGLOBIN in the  last 168 hours.  Invalid input(s): CK ------------------------------------------------------------------------------------------------------------------    Component Value Date/Time   BNP 278.8 (H) 09/25/2018 0350    Inpatient Medications  Scheduled Meds:  amLODipine  5 mg Oral Daily   apixaban  5 mg Oral BID   carvedilol  6.25 mg Oral BID WC   chlorhexidine  15 mL Mouth/Throat BID   Chlorhexidine Gluconate Cloth  6 each Topical Daily   colchicine  0.6 mg Oral Daily   furosemide  40 mg Intravenous Daily   gabapentin  100 mg Oral QHS   guaiFENesin  1,200 mg Oral BID   insulin aspart  0-15 Units Subcutaneous TID WC   insulin glargine  10 Units Subcutaneous Daily   pantoprazole  40 mg Oral Daily   polyethylene glycol  17 g Oral Daily   QUEtiapine  25 mg Oral QHS   sodium chloride flush  10-40 mL Intracatheter Q12H   Continuous Infusions:  sodium chloride 10 mL/hr at 10/05/18 1810   PRN Meds:.sodium chloride, acetaminophen (TYLENOL) oral liquid 160 mg/5 mL, albuterol, haloperidol lactate, hydrALAZINE, lip balm, Resource ThickenUp Clear, sodium chloride flush  Micro Results Recent Results (from the past 240 hour(s))  Culture, respiratory  (non-expectorated)     Status: None   Collection Time: 09/28/18 11:06 AM  Result Value Ref Range Status   Specimen Description   Final    TRACHEAL ASPIRATE Performed at [facility], [place] [address], [place], [place] [address]    Special Requests   Final    Normal Performed at [facility], [place] [address], [place], [place] [address]    Gram Stain   Final    RARE WBC PRESENT, PREDOMINANTLY PMN RARE GRAM POSITIVE COCCI RARE GRAM POSITIVE RODS    Culture   Final    FEW Consistent with normal respiratory flora. Performed at [facility], [place] [address] [place], [place] [address]    Report Status 10/01/2018 FINAL  Final  SARS Coronavirus 2 [facility] order, Performed in [place] hospital lab)     Status: Abnormal   Collection Time: 09/28/18 11:18 AM  Result Value Ref Range Status   SARS Coronavirus 2 POSITIVE (A) NEGATIVE Corrected    Comment: RESULT CALLED TO, READ BACK BY AND VERIFIED WITH: TETREAULT, H RN @1945  ON 09/28/2018 JACKSON,K Performed at [facility], [place] [address], [place], [place] [address] CORRECTED ON 04/18 AT 1954: PREVIOUSLY REPORTED AS POSITIVE CRITICAL RESULT CALLED TO, READ BACK BY AND VERIFIED WITH: TETREAULT, H RN @1945  ON 09/28/2018 JACKSON,K   Culture, blood (Routine X 2) w Reflex to ID Panel     Status: None (Preliminary result)   Collection Time: 10/04/18  1:00 PM  Result Value Ref Range Status   Specimen Description BLOOD LEFT HAND  Final   Special Requests   Final    BOTTLES DRAWN AEROBIC AND ANAEROBIC Blood Culture adequate volume   Culture   Final    NO GROWTH 3 DAYS Performed at [facility], [address], [place], [place] [address]    Report Status PENDING  Incomplete    Radiology Reports Dg Chest Port 1 View  Result Date: 10/04/2018 CLINICAL DATA:  Fever. EXAM: PORTABLE CHEST 1 VIEW COMPARISON:  Chest x-ray dated September 29, 2018. FINDINGS: Interval removal of the  endotracheal and enteric tubes. Unchanged right upper extremity PICC line. Stable cardiomediastinal silhouette. Persistent low lung volumes. Patchy bilateral airspace disease appears improved at the lung bases, but slightly  worsened in the right upper lobe. No pneumothorax or large pleural effusion. No acute osseous abnormality. IMPRESSION: 1. Patchy bilateral airspace disease appears improved at the lung bases, but slightly worsened in the right upper lobe. Electronically Signed   By: Titus Dubin M.D.   On: 10/04/2018 12:05   Dg Chest Port 1 View  Result Date: 09/29/2018 CLINICAL DATA:  Acute hypoxia due to COVID 19 Fever 101.7 yesterday EXAM: PORTABLE CHEST 1 VIEW COMPARISON:  09/26/2018 FINDINGS: Endotracheal tube, NG tube, PICC line unchanged. Stable cardiac silhouette. Low lung volumes. Some improvement in pulmonary parenchymal opacities. IMPRESSION: 1. Mild improvement pulmonary parenchymal opacities. 2. Persistent low lung volumes. 3. Stable support apparatus. Electronically Signed   By: Suzy Bouchard M.D.   On: 09/29/2018 06:30   Dg Chest Port 1 View  Result Date: 09/26/2018 CLINICAL DATA:  Shortness of breath. EXAM: PORTABLE CHEST 1 VIEW COMPARISON:  One-view chest x-ray 09/25/2018 FINDINGS: The heart size is exaggerate by low lung volumes. The endotracheal tube has been pulled back slightly, now terminating 2.5 cm above the carina. A right-sided PICC line is stable. The NG tube courses off the inferior border of the film. Bilateral patchy interstitial and airspace disease is again noted. There is less right middle lobe consolidation. No definite effusions are present. IMPRESSION: 1. Improved aeration of the right middle lobe. 2. Persistent patchy interstitial and airspace disease bilaterally is worrisome infection or edema. 3. Repositioning of endotracheal tube, now 2.5 cm above the carina. Electronically Signed   By: San Morelle M.D.   On: 09/26/2018 07:52   Dg Chest Port 1  View  Result Date: 09/25/2018 CLINICAL DATA:  78 year old female with a history of intubation EXAM: PORTABLE CHEST 1 VIEW COMPARISON:  09/24/2018 FINDINGS: Endotracheal tube now terminates at the carina. Tube may be withdrawn 6 cm-7 cm for better positioning. Right upper extremity PICC. Gastric tube terminates out of the field of view. Low lung volumes with similar appearance of reticulonodular opacity throughout with increasing airspace opacity in the right mid lung. Persistently low lung volumes.  No pneumothorax or pleural effusion. IMPRESSION: Endotracheal tube terminates at the carina and may be withdrawn 6 cm-7 cm for better positioning. Low lung volumes persist with mixed interstitial and airspace opacities bilaterally, with increasing airspace opacity in the right mid lung. Unchanged right upper extremity PICC and gastric tube. Electronically Signed   By: Corrie Mckusick D.O.   On: 09/25/2018 19:11   Dg Chest Port 1 View  Result Date: 09/24/2018 CLINICAL DATA:  Short of breath EXAM: PORTABLE CHEST 1 VIEW COMPARISON:  09/23/2018 FINDINGS: Normal heart size. Endotracheal tube, NG tube, and right PICC are stable. Patchy airspace opacities throughout both lungs are not significantly changed. No pneumothorax. IMPRESSION: Stable patchy airspace disease throughout both lungs. Electronically Signed   By: Marybelle Killings M.D.   On: 09/24/2018 07:41   Dg Chest Port 1 View  Result Date: 09/23/2018 CLINICAL DATA:  Respiratory failure. EXAM: PORTABLE CHEST 1 VIEW COMPARISON:  One-view chest x-ray 09/21/2018 FINDINGS: The heart size is normal. Endotracheal tube is stable. NG tube courses off the inferior border the film. Right-sided PICC line is stable and in satisfactory position. Bilateral interstitial and airspace disease is similar the prior exam. There is improved aeration at the left base. Increased airspace opacities are noted in the right middle and upper lobes. IMPRESSION: 1. Bilateral interstitial and  airspace disease concerning for infection. There is slight increase on the right and some improvement on the left. 2.  Support apparatus is stable. Electronically Signed   By: San Morelle M.D.   On: 09/23/2018 07:41   Dg Chest Port 1 View  Result Date: 09/21/2018 CLINICAL DATA:  Respiratory failure. EXAM: PORTABLE CHEST 1 VIEW COMPARISON:  Radiograph of September 21, 2018. FINDINGS: Stable cardiomediastinal silhouette. Endotracheal and nasogastric tubes are in grossly good position. Right-sided PICC line is unchanged. No pneumothorax or significant pleural effusion is noted. Stable bilateral lung opacities are noted concerning for pneumonia or possibly edema. Bony thorax is unremarkable. IMPRESSION: Stable support apparatus. Stable bilateral lung opacities as described above. Electronically Signed   By: Marijo Conception, M.D.   On: 09/21/2018 22:03   Dg Chest Port 1 View  Result Date: 09/21/2018 CLINICAL DATA:  Respiratory failure EXAM: PORTABLE CHEST 1 VIEW COMPARISON:  09/20/2018 FINDINGS: Endotracheal tube in good position. Right arm PICC tip in the proximal SVC unchanged. NG tube in the stomach. Mild improvement in bilateral airspace disease. Small left effusion. IMPRESSION: Endotracheal tube in good position Diffuse bilateral airspace disease with mild improvement. Electronically Signed   By: Franchot Gallo M.D.   On: 09/21/2018 08:10   Dg Chest Port 1 View  Result Date: 09/20/2018 CLINICAL DATA:  Respiratory failure.  Ventilator support. EXAM: PORTABLE CHEST 1 VIEW COMPARISON:  09/18/2018 FINDINGS: Endotracheal tube tip is 4 cm above the carina. Orogastric tube enters the abdomen. Bilateral patchy pulmonary infiltrates appear quite similar, with slightly more volume loss at the right lung base. No new finding otherwise. IMPRESSION: Persistent patchy bilateral pulmonary infiltrates. Slight worsening of volume loss at the right lung base. Electronically Signed   By: Nelson Chimes M.D.   On:  09/20/2018 06:11   Dg Chest Port 1 View  Result Date: 09/18/2018 CLINICAL DATA:  ARDS, COVID+ Pt intubated on airborne and contact PPE: Gloves, gown, N95, goggles, bouffant. EXAM: PORTABLE CHEST - 1 VIEW COMPARISON:  09/16/2018 FINDINGS: Endotracheal tube, nasogastric tube, right arm PICC stable. Slight improvement in right lower lung airspace disease with patchy hazy infiltrates in the left mid and lower lung and right upper lung as before. Heart size and mediastinal contours are within normal limits. No effusion. No pneumothorax. Fixation hardware in the proximal right humerus. IMPRESSION: 1. Patchy bilateral airspace disease, with slight improvement in right lower lung opacities since prior study. 2. Support hardware stable in position. Electronically Signed   By: Lucrezia Europe M.D.   On: 09/18/2018 08:05   Dg Chest Port 1 View  Result Date: 09/16/2018 CLINICAL DATA:  Respiratory failure EXAM: PORTABLE CHEST 1 VIEW COMPARISON:  Yesterday FINDINGS: Endotracheal tube tip between the clavicular heads and carina. The orogastric tube at least reaches the stomach. Right-sided central line with tip at the SVC. No appreciable change in patchy bilateral interstitial and airspace opacity. Stable heart size. No evidence of air leak or pleural fluid. IMPRESSION: 1. Unchanged multifocal pneumonia. 2. Stable hardware positioning. Electronically Signed   By: Monte Fantasia M.D.   On: 09/16/2018 07:09   Dg Chest Port 1 View  Result Date: 09/15/2018 CLINICAL DATA:  Respiratory failure. Intubated patient. Follow-up exam. EXAM: PORTABLE CHEST 1 VIEW COMPARISON:  Prior studies, most recent 09/14/2018 FINDINGS: Cardiac silhouette is normal size.  No mediastinal or hilar masses. There are hazy bilateral airspace lung opacities which are without change from the previous day's exam. No new lung abnormalities. No convincing pleural effusion and no pneumothorax. Endotracheal tube, nasal/orogastric tube and right PICC are stable.  IMPRESSION: 1. No change from previous day's study. 2.  Persistent bilateral ground-glass lung opacities. 3. Stable well-positioned support apparatus. Electronically Signed   By: Lajean Manes M.D.   On: 09/15/2018 06:47   Dg Chest Port 1 View  Result Date: 09/14/2018 CLINICAL DATA:  Respiratory failure.  COVID-19. EXAM: PORTABLE CHEST 1 VIEW COMPARISON:  09/13/2018 and prior studies dating back to 09/09/2018. FINDINGS: Bilateral ground-glass lung opacities are without significant change from the prior exams. Atelectasis at the left lung base is stable. More confluent opacity at the right lung base is less prominent, which appears to be due to larger lung volumes on the current exam. No new lung abnormalities.  No pleural effusion or pneumothorax. Endotracheal tube, nasal/orogastric tube and right PICC are stable in well positioned. IMPRESSION: 1. No significant change from the previous day's study. 2. Persistent bilateral ground-glass lung opacities more confluent basilar opacities. 3. Stable well-positioned support apparatus. Electronically Signed   By: Lajean Manes M.D.   On: 09/14/2018 06:24   Dg Chest Port 1 View  Result Date: 09/13/2018 CLINICAL DATA:  Respiratory failure EXAM: PORTABLE CHEST 1 VIEW COMPARISON:  Yesterday FINDINGS: Endotracheal tube tip between the clavicular heads and carina. The orogastric tube has been advanced and now at least reaches the stomach-as confirmed on interval KUB. Right PICC with tip at the SVC. Patchy bilateral airspace disease. Borderline heart size. No effusion or pneumothorax. IMPRESSION: Stable hardware positioning and bilateral pneumonia. Electronically Signed   By: Monte Fantasia M.D.   On: 09/13/2018 06:08   Dg Chest Port 1 View  Result Date: 09/12/2018 CLINICAL DATA:  History of endotracheal tube EXAM: PORTABLE CHEST 1 VIEW COMPARISON:  Yesterday FINDINGS: Endotracheal tube tip between the clavicular heads and carina. The orogastric tube tip is in the region  of the GE junction. Right PICC with tip near the SVC origin. Patchy bilateral lung opacity with both interstitial and airspace appearance. No effusion or pneumothorax. Normal heart size. These results will be called to the ordering clinician or representative by the Radiologist Assistant, and communication documented in the PACS or zVision Dashboard. IMPRESSION: 1. Shortened orogastric tube with tip now at the GE junction. 2. Other hardware positioning is stable. 3. Bilateral lung opacity with progression. Electronically Signed   By: Monte Fantasia M.D.   On: 09/12/2018 07:14   Dg Chest Port 1 View  Result Date: 09/11/2018 CLINICAL DATA:  Respiratory failure EXAM: PORTABLE CHEST 1 VIEW COMPARISON:  09/10/2018 FINDINGS: Endotracheal tube and NG tube are stable. Interval placement of right PICC line with the tip in the SVC. Patchy bilateral airspace disease similar to prior study. Heart is normal size. No visible effusions or acute bony abnormality. IMPRESSION: Right PICC line tip in the SVC. Remainder of the support devices stable. Stable patchy bilateral airspace disease. Electronically Signed   By: Rolm Baptise M.D.   On: 09/11/2018 07:36   Dg Chest Port 1 View  Result Date: 09/10/2018 CLINICAL DATA:  Respiratory failure EXAM: PORTABLE CHEST 1 VIEW COMPARISON:  09/09/2018 FINDINGS: Support devices are stable. Heart is normal size. Patchy bilateral airspace disease again noted, not significantly changed. No visible effusions or acute bony abnormality. IMPRESSION: Patchy bilateral airspace disease, not significantly changed. Electronically Signed   By: Rolm Baptise M.D.   On: 09/10/2018 07:39   Dg Chest Port 1 View  Result Date: 09/09/2018 CLINICAL DATA:  Respiratory failure. EXAM: PORTABLE CHEST 1 VIEW COMPARISON:  Chest x-ray from same day at 1 a.m. FINDINGS: Interval placement of an endotracheal tube with the tip 3.9 cm above  the carina. Enteric tube entering the stomach with the tip below the field of  view. The heart size and mediastinal contours are within normal limits. Patchy asymmetric, hazy opacities in both lungs again noted, slightly more consolidative appearance in the left mid lung and right lower lobe. No pleural effusion or pneumothorax. No acute osseous abnormality. IMPRESSION: 1. Appropriately positioned endotracheal tube. 2. Patchy asymmetric airspace disease in both lungs again noted, with slightly more focal consolidative appearance in the left mid lung and right lower lobe. Findings are nonspecific, but concerning for atypical infection, including potential viral pneumonia. Electronically Signed   By: Titus Dubin M.D.   On: 09/09/2018 17:38   Dg Chest Port 1 View  Result Date: 09/09/2018 CLINICAL DATA:  Short of breath, cough.  Diarrhea. EXAM: PORTABLE CHEST 1 VIEW COMPARISON:  Chest radiograph 05/29/2012 FINDINGS: Normal cardiac silhouette. There is bilateral fine airspace disease in the upper lobes. No pleural fluid. No pneumothorax. No acute osseous abnormality. RIGHT shoulder internal fixation. IMPRESSION: Bilateral upper lobe airspace disease representing multifocal pneumonia versus pulmonary edema. Electronically Signed   By: Suzy Bouchard M.D.   On: 09/09/2018 11:27   Dg Abd Portable 1v  Result Date: 09/25/2018 CLINICAL DATA:  78 y/o  F; intubation, NG placement. EXAM: PORTABLE ABDOMEN - 1 VIEW COMPARISON:  09/12/2018 abdomen radiographs. FINDINGS: Enteric tube tip projects over the gastric body. Normal bowel gas pattern. Ill-defined patchy opacities in lung bases. No acute osseous abnormality is evident. IMPRESSION: Enteric tube tip projects over the gastric body. Normal bowel gas pattern. Electronically Signed   By: Kristine Garbe M.D.   On: 09/25/2018 19:19   Dg Abd Portable 1v  Result Date: 09/12/2018 CLINICAL DATA:  Orogastric tube placement. EXAM: PORTABLE ABDOMEN - 1 VIEW COMPARISON:  CT abdomen and pelvis June 24, 2018 FINDINGS: Nasogastric tube tip  projects in gastric antrum. No intra-abdominal mass effect or pathologic calcifications. Included bowel gas pattern is nondilated and nonobstructive. Mild vascular calcifications. Interstitial and alveolar airspace opacities included lung bases better characterized on today's dedicated radiograph. Thoracolumbar levoscoliosis. IMPRESSION: 1. Nasogastric tube tip projects in gastric antrum. Electronically Signed   By: Elon Alas M.D.   On: 09/12/2018 22:13   [place] Ekg Site Rite  Result Date: 09/10/2018 If Site Rite image not attached, placement could not be confirmed due to current cardiac rhythm.   Phillips Climes M.D on 10/07/2018 at 12:40 PM  Between 7am to 7pm - Pager - [phone]  After 7pm go to [email or web address] - password [facility]  [facility] -  Office  [phone]

## 2018-10-08 DIAGNOSIS — E669 Obesity, unspecified: Secondary | ICD-10-CM

## 2018-10-08 DIAGNOSIS — E1169 Type 2 diabetes mellitus with other specified complication: Secondary | ICD-10-CM

## 2018-10-08 LAB — CBC
HCT: 28.1 % — ABNORMAL LOW (ref 36.0–46.0)
Hemoglobin: 8.6 g/dL — ABNORMAL LOW (ref 12.0–15.0)
MCH: 30.8 pg (ref 26.0–34.0)
MCHC: 30.6 g/dL (ref 30.0–36.0)
MCV: 100.7 fL — ABNORMAL HIGH (ref 80.0–100.0)
Platelets: 346 10*3/uL (ref 150–400)
RBC: 2.79 MIL/uL — ABNORMAL LOW (ref 3.87–5.11)
RDW: 15.3 % (ref 11.5–15.5)
WBC: 14.9 10*3/uL — ABNORMAL HIGH (ref 4.0–10.5)
nRBC: 0 % (ref 0.0–0.2)

## 2018-10-08 LAB — GLUCOSE, CAPILLARY
Glucose-Capillary: 157 mg/dL — ABNORMAL HIGH (ref 70–99)
Glucose-Capillary: 120 mg/dL — ABNORMAL HIGH (ref 70–99)
Glucose-Capillary: 184 mg/dL — ABNORMAL HIGH (ref 70–99)
Glucose-Capillary: 268 mg/dL — ABNORMAL HIGH (ref 70–99)
Glucose-Capillary: 420 mg/dL — ABNORMAL HIGH (ref 70–99)
Glucose-Capillary: 208 mg/dL — ABNORMAL HIGH (ref 70–99)

## 2018-10-08 LAB — BASIC METABOLIC PANEL
Anion gap: 9 (ref 5–15)
BUN: 21 mg/dL (ref 8–23)
CO2: 29 mmol/L (ref 22–32)
Calcium: 7.8 mg/dL — ABNORMAL LOW (ref 8.9–10.3)
Chloride: 101 mmol/L (ref 98–111)
Creatinine, Ser: 1.01 mg/dL — ABNORMAL HIGH (ref 0.44–1.00)
GFR calc Af Amer: >60 mL/min (ref >60)
GFR calc non Af Amer: 54 mL/min — ABNORMAL LOW (ref >60)
Glucose, Bld: 113 mg/dL — ABNORMAL HIGH (ref 70–99)
Potassium: 4.3 mmol/L (ref 3.5–5.1)
Sodium: 139 mmol/L (ref 135–145)

## 2018-10-08 LAB — MAGNESIUM: Magnesium: 1.4 mg/dL — ABNORMAL LOW (ref 1.7–2.4)

## 2018-10-08 MED ORDER — ENSURE ENLIVE PO LIQD
237.0000 mL | Freq: Every day | ORAL | Status: DC
  Administered 2018-10-09 – 2018-10-10 (×2): 237 mL via ORAL
  Filled 2018-10-08 (×2): qty 237

## 2018-10-08 MED ORDER — FUROSEMIDE 10 MG/ML IJ SOLN
40.0000 mg | Freq: Every day | INTRAMUSCULAR | Status: DC
  Filled 2018-10-08: qty 4

## 2018-10-08 NOTE — Progress Notes (Signed)
Nutrition Follow-up RD working remotely.  DOCUMENTATION CODES:   Not applicable  INTERVENTION:    Add Ensure Enlive po once daily, each supplement provides 350 kcal and 20 grams of protein  NUTRITION DIAGNOSIS:   Inadequate oral intake related to dysphagia, acute illness as evidenced by meal completion < 50%.  Ongoing  GOAL:   Patient will meet greater than or equal to 90% of their needs  Progressing  MONITOR:   PO intake, Supplement acceptance, Labs, Skin  REASON FOR ASSESSMENT:   Ventilator, Consult Enteral/tube feeding initiation and management  ASSESSMENT:   78 yo female with PMH of DM, HLD, HTN, CKD-3, neuromuscular disorder, B12 deficiency, osteopenia who was admitted with progressive SOB requiring intubation.  Extubated 4/20.  Diet has been upgraded to dysphagia 1 with thin liquids. Patient is consuming 50-100% of meals. She is receiving Hormel / Vital Cuisine Shake with breakfast and Magic cup with lunch and dinner daily.   Labs and medications reviewed.    Diet Order:   Diet Order            DIET - DYS 1 Room service appropriate? Yes; Fluid consistency: Thin  Diet effective now              EDUCATION NEEDS:   No education needs have been identified at this time  Skin:  Skin Assessment: Reviewed RN Assessment  Last BM:  4/27 (type 6)  Height:   Ht Readings from Last 1 Encounters:  09/09/18 5\' 3"  (1.6 m)    Weight:   Wt Readings from Last 1 Encounters:  06/21/17 70.3 kg    Ideal Body Weight:  52.3 kg  BMI:  Body mass index is 29.33 kg/m.  Estimated Nutritional Needs:   Kcal:  1600-1800  Protein:  80-100 gm  Fluid:  1.6 L    Molli Barrows, RD, LDN, [place] Pager [phone] After Hours Pager [phone]

## 2018-10-08 NOTE — Progress Notes (Signed)
  Speech Language Pathology Treatment: Dysphagia;Cognitive-Linquistic  Patient Details Name: Sabrina Baker MRN: [id number] DOB: 09-19-1940 Today's Date: 10/08/2018 Time: 3149-7026 SLP Time Calculation (min) (ACUTE ONLY): 30 min  Assessment / Plan / Recommendation Clinical Impression  Pt is making excellent gains.  Cognition is marked by improved self-awareness and turn-taking; improved insight into deficits and condition; much improved ability to follow multi-step commands; improved orientation to circumstances/place and self-correction to time.  Pt's voice is strong.  She demonstrates excellent toleration of thin liquids, and today was able to masticate soft solids despite absence of teeth without difficulty.  There were no concerns for aspiration.  Remains on [place], oxygenating in the 90s during our session.  Recommend advancing diet to soft mechanical; continue thin liquids; set up tray as needed.   SLP will continue to follow for swallow/cognition.   HPI HPI: Patient is a 78 y.o. female with PMHx of HTN, DM, CKD stage III, presented with shortness of breath and cough, found to have acute hypoxic respiratory failure due to ARDS in the setting of COVID-19 infection. ETT 3/30-4/15, stridor and vocal fold edema; reintubated 4/15-4/20.       SLP Plan  Continue with current plan of care       Recommendations  Diet recommendations: Dysphagia 3 (mechanical soft);Thin liquid Liquids provided via: Straw Medication Administration: Whole meds with puree Supervision: Staff to assist with self feeding Compensations: Minimize environmental distractions Postural Changes and/or Swallow Maneuvers: Seated upright 90 degrees;Upright 30-60 min after meal                Oral Care Recommendations: Oral care BID Follow up Recommendations: Skilled Nursing facility SLP Visit Diagnosis: Dysphagia, unspecified (R13.10) Plan: Continue with current plan of care       GO                Sabrina Baker 10/08/2018, 1:56 PM Sabrina Baker, [place] Office number [phone] [place]: [phone]

## 2018-10-08 NOTE — NC FL2 (Signed)
[place] LEVEL OF CARE SCREENING TOOL     IDENTIFICATION  Patient Name: Sabrina Baker Birthdate: [birth year] Sex: female Admission Date (Current Location): 09/09/2018  [facility] and [place] Number:  Herbalist and Address:  [facility] [place], [place] [address], [place], [place] [address]      Provider Number: [id number]  Attending Physician Name and Address:  Bonnielee Haff, MD  Relative Name and Phone Number:  Arrie Aran (patient's' daughter) [phone]    Current Level of Care: Hospital Recommended Level of Care: [place] Prior Approval Number:    Date Approved/Denied: 12/31/10 PASRR Number: [id number]  Discharge Plan: SNF    Current Diagnoses: Patient Active Problem List   Diagnosis Date Noted  . ARDS (adult respiratory distress syndrome) ([place])   . COVID-19   . Acute respiratory failure with hypoxia ([place]) 09/09/2018  . Mixed dyslipidemia 07/10/2016  . Osteopenia 07/10/2016  . Vitamin B12 deficiency 07/10/2016  . 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 Aspiration pneumonia ([place]) 04/17/2012  . Gastroenteritis 04/17/2012  . Dehydration 04/17/2012  . Diabetes mellitus ([place]) 04/17/2012  . HTN (hypertension) 04/17/2012  . TIA (transient ischemic attack) 04/17/2012    Orientation RESPIRATION BLADDER Height & Weight     Self, Situation, Place  O2(nasal cannula 1L/min) Incontinent, External catheter Weight: (bed not working) Height:  5\' 3"  (160 cm)  BEHAVIORAL SYMPTOMS/MOOD NEUROLOGICAL BOWEL NUTRITION STATUS      Continent Diet(see discharge summary, currently dysphagia 1 puree thin liquids with straw needs assist)  AMBULATORY STATUS COMMUNICATION OF NEEDS Skin   Extensive Assist(Stedy used for assist) Verbally Other (Comment)(MASD buttocks)                       Personal Care Assistance Level of Assistance  Bathing, Feeding, Dressing, Total care Bathing Assistance: Limited  assistance Feeding assistance: Limited assistance Dressing Assistance: Limited assistance Total Care Assistance: Maximum assistance   Functional Limitations Info  Sight, Hearing, Speech Sight Info: Impaired Hearing Info: Adequate Speech Info: Adequate    SPECIAL CARE FACTORS FREQUENCY  PT (By licensed PT), OT (By licensed OT)     PT Frequency: min 5x weekly OT Frequency: min 5x weekly            Contractures Contractures Info: Not present    Additional Factors Info  Code Status, Allergies, Isolation Precautions Code Status Info: Full Allergies Info: Altace (ramipril), Diovan (valsartan), Niacin, Niacin and related     Isolation Precautions Info: Air/Con Precuations, Emerging Pathogen     Current Medications (10/08/2018):  This is the current hospital active medication list Current Facility-Administered Medications  Medication Dose Route Frequency Provider Last Rate Last Dose  . 0.9 %  sodium chloride infusion   Intravenous PRN Rush Farmer, MD 10 mL/hr at 10/07/18 1808 1,000 mL at 10/07/18 1808  . acetaminophen (TYLENOL) solution 650 mg  650 mg Oral Q4H PRN Bonnielee Haff, MD   650 mg at 10/04/18 2057  . albuterol (PROVENTIL HFA;VENTOLIN HFA) 108 (90 Base) MCG/ACT inhaler 2 puff  2 puff Inhalation Q4H PRN Jennelle Human B, NP      . amLODipine (NORVASC) tablet 5 mg  5 mg Oral Daily Elgergawy, Silver Huguenin, MD   5 mg at 10/08/18 0912  . apixaban (ELIQUIS) tablet 5 mg  5 mg Oral BID Bonnielee Haff, MD   5 mg at 10/08/18 0910  . carvedilol (COREG) tablet 6.25 mg  6.25 mg Oral BID WC  Bonnielee Haff, MD   6.25 mg at 10/08/18 1610  . chlorhexidine (PERIDEX) 0.12 % solution 15 mL  15 mL Mouth/Throat BID Bonnielee Haff, MD   15 mL at 10/08/18 0911  . Chlorhexidine Gluconate Cloth 2 % PADS 6 each  6 each Topical Daily Minor, Grace Bushy, NP   6 each at 10/07/18 1533  . colchicine tablet 0.6 mg  0.6 mg Oral Daily Elgergawy, Silver Huguenin, MD   0.6 mg at 10/08/18 0913  . furosemide  (LASIX) injection 40 mg  40 mg Intravenous BID Elgergawy, Silver Huguenin, MD   40 mg at 10/08/18 0911  . gabapentin (NEURONTIN) capsule 100 mg  100 mg Oral QHS Bonnielee Haff, MD   100 mg at 10/07/18 2037  . guaiFENesin (MUCINEX) 12 hr tablet 1,200 mg  1,200 mg Oral BID Elgergawy, Silver Huguenin, MD   1,200 mg at 10/08/18 0910  . haloperidol lactate (HALDOL) injection 1 mg  1 mg Intravenous Q6H PRN Bonnielee Haff, MD      . hydrALAZINE (APRESOLINE) injection 10 mg  10 mg Intravenous Q6H PRN Thurnell Lose, MD   10 mg at 10/05/18 0207  . insulin aspart (novoLOG) injection 0-15 Units  0-15 Units Subcutaneous TID WC Elgergawy, Silver Huguenin, MD   8 Units at 10/07/18 1751  . insulin glargine (LANTUS) injection 10 Units  10 Units Subcutaneous Daily Elgergawy, Silver Huguenin, MD   10 Units at 10/07/18 [phone]  . lip balm (CARMEX) ointment   Topical PRN Bonnielee Haff, MD      . pantoprazole (PROTONIX) EC tablet 40 mg  40 mg Oral Daily Bonnielee Haff, MD   40 mg at 10/08/18 0912  . polyethylene glycol (MIRALAX / GLYCOLAX) packet 17 g  17 g Oral Daily Bonnielee Haff, MD   17 g at 10/08/18 0910  . QUEtiapine (SEROQUEL) tablet 25 mg  25 mg Oral QHS Elgergawy, Silver Huguenin, MD   25 mg at 10/07/18 2037  . Resource ThickenUp Clear   Oral PRN Bonnielee Haff, MD      . sodium chloride flush (NS) 0.9 % injection 10-40 mL  10-40 mL Intracatheter Q12H Olalere, Adewale A, MD   10 mL at 10/07/18 2038  . sodium chloride flush (NS) 0.9 % injection 10-40 mL  10-40 mL Intracatheter PRN Olalere, Adewale A, MD         Discharge Medications: Please see discharge summary for a list of discharge medications.  Relevant Imaging Results:  Relevant Lab Results:   Additional Information SSN: [id number]  Alberteen Sam, LCSW

## 2018-10-08 NOTE — Progress Notes (Signed)
Physical Therapy Treatment Patient Details Name: Sabrina Baker MRN: [id number] DOB: 07-Jan-1941 Today's Date: 10/08/2018    History of Present Illness 78 y.o. female with PMHx of HTN, DM, CKD stage III, presented with shortness of breath and cough, found to have acute hypoxic respiratory failure due to ARDS in the setting of COVID-19 infection. ETT 3/30-4/15, stridor and vocal fold edema; reintubated 4/15-4/20.      PT Comments    This PT went into room to plug the call bed into wall. Patient in recliner, somnolent , sweaty and diaphoretic. BP on monitor 97/60.Pt. on 1 l with sats 100%  HR 90 down to 86 with rest. Previously 143/80. RN notified. CBG taken at 180's. Patient has decreased affect vs a few hours  Before. Assisted patient back into bed via lift/slide with linen Pads, did not attempt standing due to increased weakness and decreased BP. BP after setttling pt.  into bed 121/67. Marland Kitchen Patient was not interested in eating.  Continue  PT.  Follow Up Recommendations  SNF     Equipment Recommendations  None recommended by PT    Recommendations for Other Services       Precautions / Restrictions Precautions Precautions: Fall Precaution Comments: monitor ,esp BP,has shoes to wear    Mobility  Bed Mobility   Bed Mobility: Rolling Rolling: Min assist       Sit to sidelying: Min assist General bed mobility comments: patient with less energy, required assist to sit straight up in  recliner.  Transfers Overall transfer level: Needs assistance Equipment used: Ambulation equipment used Transfers: Sit to/from Stand Sit to Stand: Mod assist;+2 physical assistance;+2 safety/equipment;From elevated surface         General transfer comment: slid  patient to bed from recliner as  maximove battery dead. Patient was not strong enough to attempt to stand  Ambulation/Gait                 Stairs             Wheelchair Mobility    Modified Rankin (Stroke Patients  Only)       Balance Overall balance assessment: Needs assistance Sitting-balance support: Bilateral upper extremity supported;Feet supported Sitting balance-Leahy Scale: Fair Sitting balance - Comments: required stability sitting upright in recliner.       Standing balance comment: stands  with more erect  posture inside STEDybriefly with STEDY.                            Cognition Arousal/Alertness: Awake/alert Behavior During Therapy: WFL for tasks assessed/performed                       Current Attention Level: Sustained   Following Commands: Follows multi-step commands consistently   Awareness: Emergent Problem Solving: Slow processing General Comments: patient's affect is  less animated when found in recliner, duiaphoretic.      Exercises      General Comments        Pertinent Vitals/Pain Pain Assessment: No/denies pain Faces Pain Scale: Hurts little more Pain Location: right shoulder Pain Descriptors / Indicators: Sore Pain Intervention(s): Repositioned    Home Living                      Prior Function            PT Goals (current goals can now be found in the care plan  section) Progress towards PT goals: Progressing toward goals    Frequency    Min 3X/week      PT Plan Current plan remains appropriate    Co-evaluation              AM-PAC PT "6 Clicks" Mobility   Outcome Measure  Help needed turning from your back to your side while in a flat bed without using bedrails?: A Lot Help needed moving from lying on your back to sitting on the side of a flat bed without using bedrails?: A Lot Help needed moving to and from a bed to a chair (including a wheelchair)?: Total Help needed standing up from a chair using your arms (e.g., wheelchair or bedside chair)?: Total Help needed to walk in hospital room?: Total Help needed climbing 3-5 steps with a railing? : Total 6 Click Score: 8    End of Session  Equipment Utilized During Treatment: Gait belt Activity Tolerance: Treatment limited secondary to medical complications (Comment) Patient left: in bed;with call bell/phone within reach Nurse Communication: Mobility status PT Visit Diagnosis: Muscle weakness (generalized) (M62.81);Difficulty in walking, not elsewhere classified (R26.2)     Time: 4628-6381 PT Time Calculation (min) (ACUTE ONLY): 38 min  Charges:  $Therapeutic Activity: 38-52 mins      Tresa Endo PT Acute Rehabilitation Services Pager [phone] Office [phone]    Claretha Cooper 10/08/2018, 3:06 PM

## 2018-10-08 NOTE — TOC Initial Note (Signed)
Transition of Care [facility]) - Initial/Assessment Note    Patient Details  Name: Sabrina Baker MRN: [id number] Date of Birth: August 10, 1940  [facility] CM/SW Contact:    Alberteen Sam, [place] Phone Number: [phone] 10/08/2018, 9:29 AM  Clinical Narrative:                  CSW consulted with patient's daughter Arrie Aran regarding recommendation of [facility] for short term rehab when patient medically ready to dc from [place]. [name] reports family is in agreement and that first choice is [facility] and second choice is [facility]. [name] requests referrals to be sent to these two facilities then let her know feedback. CSW informed [name] of potential barriers occurring due to patient being COVID+ and current no visitor policy, Arrie Aran expresses understanding. CSW will fax out referrals and keep family up to date on bed offers/feedback.  Expected Discharge Plan: Skilled Nursing Facility Barriers to Discharge: Continued Medical Work up   Patient Goals and CMS Choice   CMS Medicare.gov Compare Post Acute Care list provided to:: Patient Represenative (must comment)([name] (patient's daughter)) Choice offered to / list presented to : Adult Children  Expected Discharge Plan and Services Expected Discharge Plan: [facility] [place]   Discharge Planning Services: NA Post Acute Care Choice: Skilled Nursing Facility                   DME Arranged: N/A DME Agency: NA       HH Arranged: NA HH Agency: NA        Prior Living Arrangements/Services   Lives with:: Self Patient language and need for interpreter reviewed:: Yes Do you feel safe going back to the place where you live?: No   needs short term rehab before returning home, family in agreement  Need for Family Participation in Patient Care: Yes (Comment) Care giver support system in place?: Yes (comment)   Criminal Activity/Legal Involvement Pertinent to Current Situation/Hospitalization: No - Comment as  needed  Activities of Daily Living Home Assistive Devices/Equipment: None ADL Screening (condition at time of admission) Patient's cognitive ability adequate to safely complete daily activities?: Yes Is the patient deaf or have difficulty hearing?: No Does the patient have difficulty seeing, even when wearing glasses/contacts?: No Does the patient have difficulty concentrating, remembering, or making decisions?: Yes Patient able to express need for assistance with ADLs?: Yes Does the patient have difficulty dressing or bathing?: Yes Independently performs ADLs?: Yes (appropriate for developmental age) Does the patient have difficulty walking or climbing stairs?: Yes Weakness of Legs: None Weakness of Arms/Hands: None  Permission Sought/Granted Permission sought to share information with : Case Manager, Customer service manager, Family Supports Permission granted to share information with : Yes, Verbal Permission Granted  Share Information with NAME: [name]  Permission granted to share info w AGENCY: SNFs  Permission granted to share info w Relationship: daughter  Permission granted to share info w Contact Information: [phone]  Emotional Assessment Appearance:: Appears stated age Attitude/Demeanor/Rapport: Unable to Assess Affect (typically observed): Unable to Assess Orientation: : Oriented to Self, Oriented to Place, Oriented to Situation(memory impairment) Alcohol / Substance Use: Not Applicable Psych Involvement: No (comment)  Admission diagnosis:  Respiratory failure ([place]) [J96.90] Patient Active Problem List   Diagnosis Date Noted  . ARDS (adult respiratory distress syndrome) ([place])   . COVID-19   . Acute respiratory failure with hypoxia ([place]) 09/09/2018  . Mixed dyslipidemia 07/10/2016  . Osteopenia 07/10/2016  . Vitamin B12  deficiency 07/10/2016  . 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 Aspiration pneumonia ([place]) 04/17/2012  .  Gastroenteritis 04/17/2012  . Dehydration 04/17/2012  . Diabetes mellitus ([place]) 04/17/2012  . HTN (hypertension) 04/17/2012  . TIA (transient ischemic attack) 04/17/2012   PCP:  [name], MD Pharmacy:   [id number] [place], [place] [address] Phone: [phone] Fax: [phone]     Social Determinants of Health (SDOH) Interventions    Readmission Risk Interventions No flowsheet data found.

## 2018-10-08 NOTE — Progress Notes (Signed)
PROGRESS NOTE                                                                                                                                                                                                             Patient Demographics:    Sabrina Baker, is a 78 y.o. female, DOB - May 11, 1941, UUV:[id number]  Outpatient Primary MD for the patient is [name], MD  LOS - 29   Chief Complaint  Patient presents with   Abdominal Pain   Cough       Brief Narrative    78 y.o.femalewith PMHx of HTN, DM, CKD stage III, presented with shortness of breath and cough, found to have acute hypoxic respiratory failure due to ARDS in the setting of COVID-19 infection.  3/30Admitted, intubated 3/21 R UE PICC 3/31>4/5 Plaquenil 4/13 transfer to [facility] 4/15 Extubated 4/15 Stridor-cord edema > reintubated 4/21 extubated    Subjective:   Patient remains mildly distracted.  However denies any complaints currently.  Shortness of breath is improved.  Still has a dry cough.  No chest pain.   Assessment  & Plan :    Active Problems:   Acute respiratory failure with hypoxia (HCC)   ARDS (adult respiratory distress syndrome) (HCC)   COVID-19   Acute Hypoxic Resp. Failure due to Covid 19 ARDS -She  was initially extubated on 4/15, then she developed stridor secondary to cord edema, requiring reintubation the same day, she was successfully extubated on 4/21. -Patient has done well from a respiratory standpoint.  She is down to 1 L of oxygen by nasal cannula.  Continue incentive spirometry.  PT and OT is working with the patient.  Prone position as tolerated.   She is being diuresed.  Renal function is tolerating this diuresis.  Lasix dose was increased to 40 mg twice daily yesterday..  Cut back Lasix to once a day for now.  -She did receive Plaquenil during earlier part of this hospitalization.  UTI -Urine culture growing both Citrobacter and  enterococcus, she received total of 7 days of IV ampicillin and Rocephin.  Abnormal thyroid function tests TSH 0.551 with free T4 4.3 and free T3 0.8 - no known history of thyroid disease - most c/w sick euthyroid state - do not feel supplementation is indicated - recheck thyroid fxn after pt is recovered from her  critical illness.  She did get levothyroxine for a brief period of time in the hospital.  Fever -Patient had fever 101.7 on 10/05/2018, so far septic work-up remains negative.  No recurrence of fever.  Agitation/delirium Minimize sedation as able and follow , patient much improved after holding daytime Seroquel.  AKI on CKD stage III Renal function is at baseline.  Monitor urine output.    Accelerated hypertension Continue amlodipine.  Blood pressure trend appears to have improved.  Paroxysmal atrial fibrillation NSR at this time -continue with Coreg, continue Eliquis  Macrocytic anemia no evidence of overt bleeding - Ferritin 467 - Folate 19.4 - B12 2058  -Hemoglobin is stable at 8.2  DM-2/Hypoglycemia A1c 7.3.  Continue low-dose Lantus and SSI.  CBGs are reasonably well controlled.  Gout attack -Right thumb, and second left digit, significantly improved after starting on colchicine, receiving 1 dose of IV Solu-Medrol.  Hypernatremia -resolved  Dysphagia Post extubation.  Speech therapy has been following.  On dysphagia 1 diet.  Code Status : Full code  Family Communication  : Will discuss with daughter.  Disposition Plan  : will need SNF when stable.  Will likely need negative tests before she can go to SNF.  Barriers For Discharge : To be changed over to oral diuretics.  She needs skilled nursing facility and they will require negative test results for she can be discharged.  Consults  :  PCCM  Procedures  :  3/30>> ETT 3/21>> RUE PICC 4/10>> A line-out 4/15>>extubated 4/15>>ETT (re-intubated) 4/20>> Extubated  DVT Prophylaxis  :  On  Eliquis  Lab Results  Component Value Date   PLT 346 10/08/2018    Antibiotics  :    Anti-infectives (From admission, onward)   Start     Dose/Rate Route Frequency Ordered Stop   09/27/18 1300  cefTRIAXone (ROCEPHIN) 2 g in sodium chloride 0.9 % 100 mL IVPB     2 g 200 mL/hr over 30 Minutes Intravenous Every 24 hours 09/27/18 0928 10/03/18 1015   09/26/18 1400  ampicillin (OMNIPEN) 1 g in sodium chloride 0.9 % 100 mL IVPB     1 g 300 mL/hr over 20 Minutes Intravenous Every 8 hours 09/26/18 1204 10/03/18 2128   09/26/18 1300  cefTRIAXone (ROCEPHIN) 1 g in sodium chloride 0.9 % 100 mL IVPB  Status:  Discontinued     1 g 200 mL/hr over 30 Minutes Intravenous Every 24 hours 09/26/18 1204 09/27/18 0928   09/11/18 2200  hydroxychloroquine (PLAQUENIL) tablet 200 mg  Status:  Discontinued     200 mg Oral 2 times daily 09/10/18 2309 09/10/18 2319   09/11/18 2200  hydroxychloroquine (PLAQUENIL) tablet 200 mg     200 mg Per Tube 2 times daily 09/10/18 2319 09/15/18 0851   09/10/18 2330  hydroxychloroquine (PLAQUENIL) tablet 400 mg     400 mg Per Tube 2 times daily 09/10/18 2319 09/11/18 0823   09/10/18 2315  hydroxychloroquine (PLAQUENIL) tablet 400 mg  Status:  Discontinued     400 mg Oral 2 times daily 09/10/18 2309 09/10/18 2319   09/10/18 1300  azithromycin (ZITHROMAX) 500 mg in sodium chloride 0.9 % 250 mL IVPB     500 mg 250 mL/hr over 60 Minutes Intravenous Every 24 hours 09/09/18 1342 09/15/18 1531   09/10/18 1200  cefTRIAXone (ROCEPHIN) 1 g in sodium chloride 0.9 % 100 mL IVPB  Status:  Discontinued     1 g 200 mL/hr over 30 Minutes Intravenous Every 24 hours 09/09/18  1342 09/14/18 1436   09/09/18 1200  cefTRIAXone (ROCEPHIN) 1 g in sodium chloride 0.9 % 100 mL IVPB     1 g 200 mL/hr over 30 Minutes Intravenous  Once 09/09/18 1148 09/09/18 1248   09/09/18 1200  azithromycin (ZITHROMAX) 500 mg in sodium chloride 0.9 % 250 mL IVPB     500 mg 250 mL/hr over 60 Minutes Intravenous   Once 09/09/18 1148 09/09/18 1351        Objective:   Vitals:   10/07/18 2100 10/07/18 2323 10/08/18 0500 10/08/18 0800  BP: 128/66 (!) 156/75 (!) 156/73   Pulse: 85 82 75   Resp: 17 20 16    Temp:  98.3 F (36.8 C) 98.4 F (36.9 C) 98.4 F (36.9 C)  TempSrc:  Oral Axillary Oral  SpO2: 96% 93% 93%   Height:        Wt Readings from Last 3 Encounters:  06/21/17 70.3 kg  01/24/16 67.1 kg  03/16/15 76.2 kg     Intake/Output Summary (Last 24 hours) at 10/08/2018 1124 Last data filed at 10/07/2018 2100 Gross per 24 hour  Intake 280 ml  Output 1876 ml  Net -1596 ml     Physical Exam  General appearance: Awake alert.  In no distress Resp: Coarse breath sounds bilaterally.  No wheezing rales or rhonchi. Cardio: S1-S2 is normal regular.  No S3-S4.  No rubs murmurs or bruit GI: Abdomen is soft.  Nontender nondistended.  Bowel sounds are present normal.  No masses organomegaly Extremities: No edema.  Full range of motion of lower extremities.     Data Review:    CBC Recent Labs  Lab 10/03/18 0205 10/05/18 0550 10/06/18 0430 10/07/18 0447 10/08/18 0255  WBC 15.4* 16.6* 17.2* 14.5* 14.9*  HGB 8.2* 7.8* 11.4* 8.2* 8.6*  HCT 28.2* 26.0* 37.8 26.8* 28.1*  PLT 535* 332 344 347 346  MCV 102.9* 103.6* 99.0 101.1* 100.7*  MCH 29.9 31.1 29.8 30.9 30.8  MCHC 29.1* 30.0 30.2 30.6 30.6  RDW 15.9* 15.6* 15.1 15.5 15.3  LYMPHSABS 1.7  --   --   --   --   MONOABS 1.5*  --   --   --   --   EOSABS 0.9*  --   --   --   --   BASOSABS 0.0  --   --   --   --     Chemistries  Recent Labs  Lab 10/03/18 0205 10/04/18 0140 10/05/18 0550 10/06/18 0430 10/07/18 0447 10/08/18 0255  NA 145 146* 140 135 141 139  K 4.0 4.3 3.7 4.6 4.2 4.3  CL 109 117* 107 101 106 101  CO2 28 26 27 26 29 29   GLUCOSE 163* 196* 110* 305* 199* 113*  BUN 74* 88* 30* 26* 23 21  CREATININE 1.55* 1.05* 1.20* 1.26* 1.05* 1.01*  CALCIUM 8.1* 9.3 7.6* 7.6* 7.8* 7.8*  MG  --   --   --   --   --  1.4*   AST 24  --   --   --   --   --   ALT 22  --   --   --   --   --   ALKPHOS 41  --   --   --   --   --   BILITOT 0.4  --   --   --   --   --      Lab Results  Component Value Date   HGBA1C  7.3 (H) 04/19/2012       Component Value Date/Time   BNP 278.8 (H) 09/25/2018 0350    Inpatient Medications  Scheduled Meds:  amLODipine  5 mg Oral Daily   apixaban  5 mg Oral BID   carvedilol  6.25 mg Oral BID WC   chlorhexidine  15 mL Mouth/Throat BID   Chlorhexidine Gluconate Cloth  6 each Topical Daily   colchicine  0.6 mg Oral Daily   feeding supplement (ENSURE ENLIVE)  237 mL Oral Q2000   furosemide  40 mg Intravenous BID   gabapentin  100 mg Oral QHS   guaiFENesin  1,200 mg Oral BID   insulin aspart  0-15 Units Subcutaneous TID WC   insulin glargine  10 Units Subcutaneous Daily   pantoprazole  40 mg Oral Daily   polyethylene glycol  17 g Oral Daily   QUEtiapine  25 mg Oral QHS   sodium chloride flush  10-40 mL Intracatheter Q12H   Continuous Infusions:  sodium chloride 1,000 mL (10/07/18 1808)   PRN Meds:.sodium chloride, acetaminophen (TYLENOL) oral liquid 160 mg/5 mL, albuterol, haloperidol lactate, hydrALAZINE, lip balm, Resource ThickenUp Clear, sodium chloride flush  Micro Results Recent Results (from the past 240 hour(s))  Culture, blood (Routine X 2) w Reflex to ID Panel     Status: None (Preliminary result)   Collection Time: 10/04/18  1:00 PM  Result Value Ref Range Status   Specimen Description BLOOD LEFT HAND  Final   Special Requests   Final    BOTTLES DRAWN AEROBIC AND ANAEROBIC Blood Culture adequate volume   Culture   Final    NO GROWTH 4 DAYS Performed at [facility], [address] [place], [place] [address]    Report Status PENDING  Incomplete    Radiology Reports Dg Chest Port 1 View  Result Date: 10/04/2018 CLINICAL DATA:  Fever. EXAM: PORTABLE CHEST 1 VIEW COMPARISON:  Chest x-ray dated September 29, 2018. FINDINGS:  Interval removal of the endotracheal and enteric tubes. Unchanged right upper extremity PICC line. Stable cardiomediastinal silhouette. Persistent low lung volumes. Patchy bilateral airspace disease appears improved at the lung bases, but slightly worsened in the right upper lobe. No pneumothorax or large pleural effusion. No acute osseous abnormality. IMPRESSION: 1. Patchy bilateral airspace disease appears improved at the lung bases, but slightly worsened in the right upper lobe. Electronically Signed   By: Titus Dubin M.D.   On: 10/04/2018 12:05   Dg Chest Port 1 View  Result Date: 09/29/2018 CLINICAL DATA:  Acute hypoxia due to COVID 19 Fever 101.7 yesterday EXAM: PORTABLE CHEST 1 VIEW COMPARISON:  09/26/2018 FINDINGS: Endotracheal tube, NG tube, PICC line unchanged. Stable cardiac silhouette. Low lung volumes. Some improvement in pulmonary parenchymal opacities. IMPRESSION: 1. Mild improvement pulmonary parenchymal opacities. 2. Persistent low lung volumes. 3. Stable support apparatus. Electronically Signed   By: Suzy Bouchard M.D.   On: 09/29/2018 06:30   Dg Chest Port 1 View  Result Date: 09/26/2018 CLINICAL DATA:  Shortness of breath. EXAM: PORTABLE CHEST 1 VIEW COMPARISON:  One-view chest x-ray 09/25/2018 FINDINGS: The heart size is exaggerate by low lung volumes. The endotracheal tube has been pulled back slightly, now terminating 2.5 cm above the carina. A right-sided PICC line is stable. The NG tube courses off the inferior border of the film. Bilateral patchy interstitial and airspace disease is again noted. There is less right middle lobe consolidation. No definite effusions are present. IMPRESSION: 1. Improved aeration of the  right middle lobe. 2. Persistent patchy interstitial and airspace disease bilaterally is worrisome infection or edema. 3. Repositioning of endotracheal tube, now 2.5 cm above the carina. Electronically Signed   By: San Morelle M.D.   On: 09/26/2018 07:52    Dg Chest Port 1 View  Result Date: 09/25/2018 CLINICAL DATA:  78 year old female with a history of intubation EXAM: PORTABLE CHEST 1 VIEW COMPARISON:  09/24/2018 FINDINGS: Endotracheal tube now terminates at the carina. Tube may be withdrawn 6 cm-7 cm for better positioning. Right upper extremity PICC. Gastric tube terminates out of the field of view. Low lung volumes with similar appearance of reticulonodular opacity throughout with increasing airspace opacity in the right mid lung. Persistently low lung volumes.  No pneumothorax or pleural effusion. IMPRESSION: Endotracheal tube terminates at the carina and may be withdrawn 6 cm-7 cm for better positioning. Low lung volumes persist with mixed interstitial and airspace opacities bilaterally, with increasing airspace opacity in the right mid lung. Unchanged right upper extremity PICC and gastric tube. Electronically Signed   By: Corrie Mckusick D.O.   On: 09/25/2018 19:11   Dg Chest Port 1 View  Result Date: 09/24/2018 CLINICAL DATA:  Short of breath EXAM: PORTABLE CHEST 1 VIEW COMPARISON:  09/23/2018 FINDINGS: Normal heart size. Endotracheal tube, NG tube, and right PICC are stable. Patchy airspace opacities throughout both lungs are not significantly changed. No pneumothorax. IMPRESSION: Stable patchy airspace disease throughout both lungs. Electronically Signed   By: Marybelle Killings M.D.   On: 09/24/2018 07:41   Dg Chest Port 1 View  Result Date: 09/23/2018 CLINICAL DATA:  Respiratory failure. EXAM: PORTABLE CHEST 1 VIEW COMPARISON:  One-view chest x-ray 09/21/2018 FINDINGS: The heart size is normal. Endotracheal tube is stable. NG tube courses off the inferior border the film. Right-sided PICC line is stable and in satisfactory position. Bilateral interstitial and airspace disease is similar the prior exam. There is improved aeration at the left base. Increased airspace opacities are noted in the right middle and upper lobes. IMPRESSION: 1. Bilateral  interstitial and airspace disease concerning for infection. There is slight increase on the right and some improvement on the left. 2. Support apparatus is stable. Electronically Signed   By: San Morelle M.D.   On: 09/23/2018 07:41   Dg Chest Port 1 View  Result Date: 09/21/2018 CLINICAL DATA:  Respiratory failure. EXAM: PORTABLE CHEST 1 VIEW COMPARISON:  Radiograph of September 21, 2018. FINDINGS: Stable cardiomediastinal silhouette. Endotracheal and nasogastric tubes are in grossly good position. Right-sided PICC line is unchanged. No pneumothorax or significant pleural effusion is noted. Stable bilateral lung opacities are noted concerning for pneumonia or possibly edema. Bony thorax is unremarkable. IMPRESSION: Stable support apparatus. Stable bilateral lung opacities as described above. Electronically Signed   By: Marijo Conception, M.D.   On: 09/21/2018 22:03   Dg Chest Port 1 View  Result Date: 09/21/2018 CLINICAL DATA:  Respiratory failure EXAM: PORTABLE CHEST 1 VIEW COMPARISON:  09/20/2018 FINDINGS: Endotracheal tube in good position. Right arm PICC tip in the proximal SVC unchanged. NG tube in the stomach. Mild improvement in bilateral airspace disease. Small left effusion. IMPRESSION: Endotracheal tube in good position Diffuse bilateral airspace disease with mild improvement. Electronically Signed   By: Franchot Gallo M.D.   On: 09/21/2018 08:10   Dg Chest Port 1 View  Result Date: 09/20/2018 CLINICAL DATA:  Respiratory failure.  Ventilator support. EXAM: PORTABLE CHEST 1 VIEW COMPARISON:  09/18/2018 FINDINGS: Endotracheal tube tip is 4 cm  above the carina. Orogastric tube enters the abdomen. Bilateral patchy pulmonary infiltrates appear quite similar, with slightly more volume loss at the right lung base. No new finding otherwise. IMPRESSION: Persistent patchy bilateral pulmonary infiltrates. Slight worsening of volume loss at the right lung base. Electronically Signed   By: Nelson Chimes  M.D.   On: 09/20/2018 06:11   Dg Chest Port 1 View  Result Date: 09/18/2018 CLINICAL DATA:  ARDS, COVID+ Pt intubated on airborne and contact PPE: Gloves, gown, N95, goggles, bouffant. EXAM: PORTABLE CHEST - 1 VIEW COMPARISON:  09/16/2018 FINDINGS: Endotracheal tube, nasogastric tube, right arm PICC stable. Slight improvement in right lower lung airspace disease with patchy hazy infiltrates in the left mid and lower lung and right upper lung as before. Heart size and mediastinal contours are within normal limits. No effusion. No pneumothorax. Fixation hardware in the proximal right humerus. IMPRESSION: 1. Patchy bilateral airspace disease, with slight improvement in right lower lung opacities since prior study. 2. Support hardware stable in position. Electronically Signed   By: Lucrezia Europe M.D.   On: 09/18/2018 08:05   Dg Chest Port 1 View  Result Date: 09/16/2018 CLINICAL DATA:  Respiratory failure EXAM: PORTABLE CHEST 1 VIEW COMPARISON:  Yesterday FINDINGS: Endotracheal tube tip between the clavicular heads and carina. The orogastric tube at least reaches the stomach. Right-sided central line with tip at the SVC. No appreciable change in patchy bilateral interstitial and airspace opacity. Stable heart size. No evidence of air leak or pleural fluid. IMPRESSION: 1. Unchanged multifocal pneumonia. 2. Stable hardware positioning. Electronically Signed   By: Monte Fantasia M.D.   On: 09/16/2018 07:09   Dg Chest Port 1 View  Result Date: 09/15/2018 CLINICAL DATA:  Respiratory failure. Intubated patient. Follow-up exam. EXAM: PORTABLE CHEST 1 VIEW COMPARISON:  Prior studies, most recent 09/14/2018 FINDINGS: Cardiac silhouette is normal size.  No mediastinal or hilar masses. There are hazy bilateral airspace lung opacities which are without change from the previous day's exam. No new lung abnormalities. No convincing pleural effusion and no pneumothorax. Endotracheal tube, nasal/orogastric tube and right PICC are  stable. IMPRESSION: 1. No change from previous day's study. 2. Persistent bilateral ground-glass lung opacities. 3. Stable well-positioned support apparatus. Electronically Signed   By: Lajean Manes M.D.   On: 09/15/2018 06:47   Dg Chest Port 1 View  Result Date: 09/14/2018 CLINICAL DATA:  Respiratory failure.  COVID-19. EXAM: PORTABLE CHEST 1 VIEW COMPARISON:  09/13/2018 and prior studies dating back to 09/09/2018. FINDINGS: Bilateral ground-glass lung opacities are without significant change from the prior exams. Atelectasis at the left lung base is stable. More confluent opacity at the right lung base is less prominent, which appears to be due to larger lung volumes on the current exam. No new lung abnormalities.  No pleural effusion or pneumothorax. Endotracheal tube, nasal/orogastric tube and right PICC are stable in well positioned. IMPRESSION: 1. No significant change from the previous day's study. 2. Persistent bilateral ground-glass lung opacities more confluent basilar opacities. 3. Stable well-positioned support apparatus. Electronically Signed   By: Lajean Manes M.D.   On: 09/14/2018 06:24   Dg Chest Port 1 View  Result Date: 09/13/2018 CLINICAL DATA:  Respiratory failure EXAM: PORTABLE CHEST 1 VIEW COMPARISON:  Yesterday FINDINGS: Endotracheal tube tip between the clavicular heads and carina. The orogastric tube has been advanced and now at least reaches the stomach-as confirmed on interval KUB. Right PICC with tip at the SVC. Patchy bilateral airspace disease. Borderline heart size.  No effusion or pneumothorax. IMPRESSION: Stable hardware positioning and bilateral pneumonia. Electronically Signed   By: Monte Fantasia M.D.   On: 09/13/2018 06:08   Dg Chest Port 1 View  Result Date: 09/12/2018 CLINICAL DATA:  History of endotracheal tube EXAM: PORTABLE CHEST 1 VIEW COMPARISON:  Yesterday FINDINGS: Endotracheal tube tip between the clavicular heads and carina. The orogastric tube tip is in the  region of the GE junction. Right PICC with tip near the SVC origin. Patchy bilateral lung opacity with both interstitial and airspace appearance. No effusion or pneumothorax. Normal heart size. These results will be called to the ordering clinician or representative by the Radiologist Assistant, and communication documented in the PACS or zVision Dashboard. IMPRESSION: 1. Shortened orogastric tube with tip now at the GE junction. 2. Other hardware positioning is stable. 3. Bilateral lung opacity with progression. Electronically Signed   By: Monte Fantasia M.D.   On: 09/12/2018 07:14   Dg Chest Port 1 View  Result Date: 09/11/2018 CLINICAL DATA:  Respiratory failure EXAM: PORTABLE CHEST 1 VIEW COMPARISON:  09/10/2018 FINDINGS: Endotracheal tube and NG tube are stable. Interval placement of right PICC line with the tip in the SVC. Patchy bilateral airspace disease similar to prior study. Heart is normal size. No visible effusions or acute bony abnormality. IMPRESSION: Right PICC line tip in the SVC. Remainder of the support devices stable. Stable patchy bilateral airspace disease. Electronically Signed   By: Rolm Baptise M.D.   On: 09/11/2018 07:36   Dg Chest Port 1 View  Result Date: 09/10/2018 CLINICAL DATA:  Respiratory failure EXAM: PORTABLE CHEST 1 VIEW COMPARISON:  09/09/2018 FINDINGS: Support devices are stable. Heart is normal size. Patchy bilateral airspace disease again noted, not significantly changed. No visible effusions or acute bony abnormality. IMPRESSION: Patchy bilateral airspace disease, not significantly changed. Electronically Signed   By: Rolm Baptise M.D.   On: 09/10/2018 07:39   Dg Chest Port 1 View  Result Date: 09/09/2018 CLINICAL DATA:  Respiratory failure. EXAM: PORTABLE CHEST 1 VIEW COMPARISON:  Chest x-ray from same day at 1 a.m. FINDINGS: Interval placement of an endotracheal tube with the tip 3.9 cm above the carina. Enteric tube entering the stomach with the tip below the  field of view. The heart size and mediastinal contours are within normal limits. Patchy asymmetric, hazy opacities in both lungs again noted, slightly more consolidative appearance in the left mid lung and right lower lobe. No pleural effusion or pneumothorax. No acute osseous abnormality. IMPRESSION: 1. Appropriately positioned endotracheal tube. 2. Patchy asymmetric airspace disease in both lungs again noted, with slightly more focal consolidative appearance in the left mid lung and right lower lobe. Findings are nonspecific, but concerning for atypical infection, including potential viral pneumonia. Electronically Signed   By: Titus Dubin M.D.   On: 09/09/2018 17:38   Dg Chest Port 1 View  Result Date: 09/09/2018 CLINICAL DATA:  Short of breath, cough.  Diarrhea. EXAM: PORTABLE CHEST 1 VIEW COMPARISON:  Chest radiograph 05/29/2012 FINDINGS: Normal cardiac silhouette. There is bilateral fine airspace disease in the upper lobes. No pleural fluid. No pneumothorax. No acute osseous abnormality. RIGHT shoulder internal fixation. IMPRESSION: Bilateral upper lobe airspace disease representing multifocal pneumonia versus pulmonary edema. Electronically Signed   By: Suzy Bouchard M.D.   On: 09/09/2018 11:27   Dg Abd Portable 1v  Result Date: 09/25/2018 CLINICAL DATA:  78 y/o  F; intubation, NG placement. EXAM: PORTABLE ABDOMEN - 1 VIEW COMPARISON:  09/12/2018 abdomen radiographs.  FINDINGS: Enteric tube tip projects over the gastric body. Normal bowel gas pattern. Ill-defined patchy opacities in lung bases. No acute osseous abnormality is evident. IMPRESSION: Enteric tube tip projects over the gastric body. Normal bowel gas pattern. Electronically Signed   By: Kristine Garbe M.D.   On: 09/25/2018 19:19   Dg Abd Portable 1v  Result Date: 09/12/2018 CLINICAL DATA:  Orogastric tube placement. EXAM: PORTABLE ABDOMEN - 1 VIEW COMPARISON:  CT abdomen and pelvis June 24, 2018 FINDINGS: Nasogastric  tube tip projects in gastric antrum. No intra-abdominal mass effect or pathologic calcifications. Included bowel gas pattern is nondilated and nonobstructive. Mild vascular calcifications. Interstitial and alveolar airspace opacities included lung bases better characterized on today's dedicated radiograph. Thoracolumbar levoscoliosis. IMPRESSION: 1. Nasogastric tube tip projects in gastric antrum. Electronically Signed   By: Elon Alas M.D.   On: 09/12/2018 22:13   [place] Ekg Site Rite  Result Date: 09/10/2018 If Site Rite image not attached, placement could not be confirmed due to current cardiac rhythm.   Bonnielee Haff M.D on 10/08/2018 at 11:24 AM  Pager on [email or web address] - password [facility]  [facility] -  Office  [phone]

## 2018-10-08 NOTE — Progress Notes (Addendum)
Physical Therapy Treatment Patient Details Name: Sabrina Baker MRN: [id number] DOB: 1940/12/15 Today's Date: 10/08/2018    History of Present Illness 78 y.o. female with PMHx of HTN, DM, CKD stage III, presented with shortness of breath and cough, found to have acute hypoxic respiratory failure due to ARDS in the setting of COVID-19 infection. ETT 3/30-4/15, stridor and vocal fold edema; reintubated 4/15-4/20.      PT Comments    The patient is very talkative, animated and "feeling good" . The  Patient's BP 98/53 after transfer to [place]. Patient  Made attempts to  Self wash.  Patient stood with less assistance from bed while holding to STEDY. Continue PT  For mobility and strengthening. Patient  Placed on RA initially with sats noted to drop to low 80's. Patient in bed. Replaced 1 liter with noted episodic drops to mid 80's/ RN in room and aware.  Follow Up Recommendations  SNF     Equipment Recommendations  None recommended by PT    Recommendations for Other Services       Precautions / Restrictions Precautions Precautions: Fall Precaution Comments: monitor ,esp BP,has shoes to wear    Mobility  Bed Mobility   Bed Mobility: Rolling;Sidelying to Sit Rolling: Min guard       Sit to sidelying: Min assist General bed mobility comments: patient moved legs to bed edge, pushed self up  with min assist to sitting.   Transfers Overall transfer level: Needs assistance Equipment used: Ambulation equipment used Transfers: Sit to/from Stand Sit to Stand: Mod assist;+2 physical assistance;+2 safety/equipment;From elevated surface         General transfer comment: Cues for hand placement to reach for STEDY. Extra time to perform . Assist of 2 \\mod  assist to stand from bed in stedy x 3 total. Cues for erect posture to place flaps.Stedy assisted to recliner  Ambulation/Gait                 Stairs             Wheelchair Mobility    Modified  Rankin (Stroke Patients Only)       Balance Overall balance assessment: Needs assistance Sitting-balance support: Bilateral upper extremity supported;Feet supported Sitting balance-Leahy Scale: Fair         Standing balance comment: stands  with more erect  posture inside STEDybriefly with STEDY.                            Cognition Arousal/Alertness: Awake/alert Behavior During Therapy: WFL for tasks assessed/performed                       Current Attention Level: Sustained   Following Commands: Follows multi-step commands consistently   Awareness: Emergent   General Comments: very talkative, animated.      Exercises      General Comments        Pertinent Vitals/Pain Pain Assessment: Faces Faces Pain Scale: Hurts little more Pain Location: right shoulder Pain Descriptors / Indicators: Sore Pain Intervention(s): Monitored during session;Repositioned    Home Living                      Prior Function            PT Goals (current goals can now be found in the care plan section) Progress towards PT goals: Progressing toward goals    Frequency  Min 3X/week      PT Plan Current plan remains appropriate    Co-evaluation              AM-PAC PT "6 Clicks" Mobility   Outcome Measure  Help needed turning from your back to your side while in a flat bed without using bedrails?: A Lot Help needed moving from lying on your back to sitting on the side of a flat bed without using bedrails?: A Lot Help needed moving to and from a bed to a chair (including a wheelchair)?: Total Help needed standing up from a chair using your arms (e.g., wheelchair or bedside chair)?: Total Help needed to walk in hospital room?: Total Help needed climbing 3-5 steps with a railing? : Total 6 Click Score: 8    End of Session Equipment Utilized During Treatment: Gait belt Activity Tolerance: Patient tolerated treatment well Patient left: in  chair;with call bell/phone within reach Nurse Communication: Mobility status PT Visit Diagnosis: Muscle weakness (generalized) (M62.81);Difficulty in walking, not elsewhere classified (R26.2)     Time: 0900-1000 PT Time Calculation (min) (ACUTE ONLY): 60 min  Charges:  $Therapeutic Activity: 23-37 mins $Self Care/Home Management: [place] Pager [phone] Office [phone]    Claretha Cooper 10/08/2018, 12:43 PM

## 2018-10-09 LAB — GLUCOSE, CAPILLARY
Glucose-Capillary: 216 mg/dL — ABNORMAL HIGH (ref 70–99)
Glucose-Capillary: 156 mg/dL — ABNORMAL HIGH (ref 70–99)
Glucose-Capillary: 212 mg/dL — ABNORMAL HIGH (ref 70–99)

## 2018-10-09 LAB — BASIC METABOLIC PANEL
Anion gap: 8 (ref 5–15)
BUN: 18 mg/dL (ref 8–23)
CO2: 30 mmol/L (ref 22–32)
Calcium: 7.9 mg/dL — ABNORMAL LOW (ref 8.9–10.3)
Chloride: 100 mmol/L (ref 98–111)
Creatinine, Ser: 1 mg/dL (ref 0.44–1.00)
GFR calc Af Amer: >60 mL/min (ref >60)
GFR calc non Af Amer: 54 mL/min — ABNORMAL LOW (ref >60)
Glucose, Bld: 167 mg/dL — ABNORMAL HIGH (ref 70–99)
Potassium: 3.7 mmol/L (ref 3.5–5.1)
Sodium: 138 mmol/L (ref 135–145)

## 2018-10-09 LAB — CULTURE, BLOOD (ROUTINE X 2)
Culture: NO GROWTH
Special Requests: ADEQUATE

## 2018-10-09 MED ORDER — MAGNESIUM SULFATE 2 GM/50ML IV SOLN
2.0000 g | Freq: Once | INTRAVENOUS | Status: AC
  Administered 2018-10-09: 2 g via INTRAVENOUS
  Filled 2018-10-09: qty 50

## 2018-10-09 MED ORDER — POTASSIUM CHLORIDE CRYS ER 20 MEQ PO TBCR
40.0000 meq | EXTENDED_RELEASE_TABLET | Freq: Once | ORAL | Status: AC
  Administered 2018-10-09: 40 meq via ORAL
  Filled 2018-10-09: qty 2

## 2018-10-09 NOTE — Progress Notes (Signed)
  Speech Language Pathology Treatment: Dysphagia;Cognitive-Linquistic  Patient Details Name: Sabrina Baker MRN: [id number] DOB: [birth year] Today's Date: 10/09/2018 Time: 2122-4825 SLP Time Calculation (min) (ACUTE ONLY): 34 min  Assessment / Plan / Recommendation Clinical Impression  Pt was sitting upright in her chair after just having finished working with PT/OT. She is self-feeding now, consuming thin liquids via straw and graham crackers. A single instance of wet vocal quality was noted, but pt self-corrected with a spontaneous, although briefly delayed, throat clear. Otherwise, pt consumed POs without overt difficulty. Per chart, she appears to be tolerating this diet well. Given that her voice remains mildly rough (although greatly improved since extubation) s/p very lengthy intubation, additional, but perhaps brief, f/u for swallowing is indicated.  Cognitively pt continues to make progress as well. She is communicating much more fluently, and is actually quite tangential, needing Min cues to maintain topic of conversation. She recalled all items from her breakfast tray with Mod I and had a very appropriate conversation with her granddaughter over the phone, explaining to me in detail who several of her family members are and their relationship to her (with accuracy for most confirmed through conversation with granddaughter). She is oriented x4 and demonstrates intellectual awareness of her weakness with Mod I. Although she demonstrated appropriate, functional problem solving well during self-feeding, she did need Min cues for simple verbal problem solving. Will continue to follow to maximize functional independence in pt who was independent PTA.   HPI HPI: Patient is a 78 y.o. female with PMHx of HTN, DM, CKD stage III, presented with shortness of breath and cough, found to have acute hypoxic respiratory failure due to ARDS in the setting of COVID-19 infection. ETT 3/30-4/15, stridor and vocal  fold edema; reintubated 4/15-4/20.       SLP Plan  Continue with current plan of care       Recommendations  Diet recommendations: Dysphagia 3 (mechanical soft);Thin liquid Liquids provided via: Straw Medication Administration: Whole meds with puree Supervision: Staff to assist with self feeding Compensations: Minimize environmental distractions Postural Changes and/or Swallow Maneuvers: Seated upright 90 degrees;Upright 30-60 min after meal                Oral Care Recommendations: Oral care BID Follow up Recommendations: Skilled Nursing facility SLP Visit Diagnosis: Dysphagia, unspecified (R13.10) Plan: Continue with current plan of care       GO                Venita Sheffield Lanell Carpenter 10/09/2018, 9:59 AM  Pollyann Glen, M.A. [place] Acute Environmental education officer [phone] Office [phone]

## 2018-10-09 NOTE — Progress Notes (Signed)
PROGRESS NOTE  Sabrina Baker RSW:[id number] DOB: 02/07/1941 DOA: 09/09/2018  PCP: [name], MD  Brief History/Interval Summary: 78 y.o.femalewith PMHx of HTN, DM, CKD stage III, presented with shortness of breath and cough, found to have acute hypoxic respiratory failure due to ARDS in the setting of COVID-19 infection.  3/30Admitted, intubated 3/21 R UE PICC 3/31>4/5 Plaquenil 4/13 transfer to [facility] 4/15 Extubated 4/15 Stridor-cord edema>reintubated 4/21 extubated  Reason for Visit: Pneumonia due to COVID-19  Consultants: He was is seen by critical care medicine  Procedures:  3/30>> ETT 3/21>> RUE PICC 4/10>> A line-out 4/15>>extubated 4/15>>ETT (re-intubated) 4/20>> Extubated  Antibiotics: Anti-infectives (From admission, onward)   Start     Dose/Rate Route Frequency Ordered Stop   09/27/18 1300  cefTRIAXone (ROCEPHIN) 2 g in sodium chloride 0.9 % 100 mL IVPB     2 g 200 mL/hr over 30 Minutes Intravenous Every 24 hours 09/27/18 0928 10/03/18 1015   09/26/18 1400  ampicillin (OMNIPEN) 1 g in sodium chloride 0.9 % 100 mL IVPB     1 g 300 mL/hr over 20 Minutes Intravenous Every 8 hours 09/26/18 1204 10/03/18 2128   09/26/18 1300  cefTRIAXone (ROCEPHIN) 1 g in sodium chloride 0.9 % 100 mL IVPB  Status:  Discontinued     1 g 200 mL/hr over 30 Minutes Intravenous Every 24 hours 09/26/18 1204 09/27/18 0928   09/11/18 2200  hydroxychloroquine (PLAQUENIL) tablet 200 mg  Status:  Discontinued     200 mg Oral 2 times daily 09/10/18 2309 09/10/18 2319   09/11/18 2200  hydroxychloroquine (PLAQUENIL) tablet 200 mg     200 mg Per Tube 2 times daily 09/10/18 2319 09/15/18 0851   09/10/18 2330  hydroxychloroquine (PLAQUENIL) tablet 400 mg     400 mg Per Tube 2 times daily 09/10/18 2319 09/11/18 0823   09/10/18 2315  hydroxychloroquine (PLAQUENIL) tablet 400 mg  Status:  Discontinued     400 mg Oral 2 times daily 09/10/18 2309 09/10/18 2319   09/10/18 1300  azithromycin  (ZITHROMAX) 500 mg in sodium chloride 0.9 % 250 mL IVPB     500 mg 250 mL/hr over 60 Minutes Intravenous Every 24 hours 09/09/18 1342 09/15/18 1531   09/10/18 1200  cefTRIAXone (ROCEPHIN) 1 g in sodium chloride 0.9 % 100 mL IVPB  Status:  Discontinued     1 g 200 mL/hr over 30 Minutes Intravenous Every 24 hours 09/09/18 1342 09/14/18 1436   09/09/18 1200  cefTRIAXone (ROCEPHIN) 1 g in sodium chloride 0.9 % 100 mL IVPB     1 g 200 mL/hr over 30 Minutes Intravenous  Once 09/09/18 1148 09/09/18 1248   09/09/18 1200  azithromycin (ZITHROMAX) 500 mg in sodium chloride 0.9 % 250 mL IVPB     500 mg 250 mL/hr over 60 Minutes Intravenous  Once 09/09/18 1148 09/09/18 1351       Subjective/Interval History: Patient states that she is feeling well.  Denies any complaints.  No nausea vomiting.  No issues per nursing staff.   Assessment/Plan:  Acute Hypoxic Resp. Failure due to Acute Covid 19 Viral Illness during the ongoing 2020 Covid 19 Pandemic -She  was initially extubated on 4/15, then she developed stridor secondary to cord edema, requiring reintubation the same day, she was successfully extubated on 4/21. -Continue incentive spirometry.  PT and OT is working with the patient.  Prone position as tolerated.   Patient was diuresed.  Respiratory status is stable.  Will discontinue Lasix for now. -She  did receive Plaquenil during earlier part of this hospitalization. -Patient is stable from a respiratory standpoint.  She is saturating well on 1 L of oxygen by nasal cannula.  Orthostatic hypotension in the setting of essential hypertension Blood pressure noted to drop when she goes from supine to standing position.  We will stop her diuretics.  She is noted to be on amlodipine as she is quite hypertensive when supine.  Continue for now.  We may need to discontinue this as well.  May need to use TED stockings.  UTI -Urine culture growing both Citrobacter and enterococcus, she received total of 7  days of IV ampicillin and Rocephin.  Abnormal thyroid function tests TSH 0.551 with free T4 4.3 and free T3 0.8.  No known history of thyroid disease.  She did get levothyroxine briefly when she was in the ICU.  Subsequently discontinued.  Most likely sick euthyroid.  Will benefit from recheck in the next 4 to 6 weeks in the outpatient setting.  Fever -Patient had fever 101.7 on 10/05/2018, so far septic work-up remains negative.  No recurrence of fever.  Agitation/delirium Mentation has improved significantly.  Continue to monitor.    AKI on CKD stage III Renal function is at baseline.  Monitor urine output.    Paroxysmal atrial fibrillation Currently in normal sinus rhythm.  Continue carvedilol.  Continue Eliquis.    Macrocytic anemia No evidenceofovert bleeding-Ferritin 467-Folate 19.4-B12 2058.  Hemoglobin remained stable.  Diabetes mellitus type 2 with hyperglycemia A1c 7.3.    Occasional high CBG readings noted.  Continue current dose of Lantus and SSI for now.    Gout attack -Right thumb, and second left digit, significantly improved after starting on colchicine, receiving 1 dose of IV Solu-Medrol.  Hypernatremia -resolved  Dysphagia Post extubation.  Speech therapy has been following.  Now on soft diet.  Code Status : Full code  Family Communication  :  Discussed with her daughter yesterday  Disposition Plan  : will need SNF when stable.    Will need a negative test per Education officer, museum.  Barriers For Discharge :  Will need to go to skilled nursing facility.  As per Education officer, museum they will need a negative tests.  This will be ordered today..   Medications:  Scheduled: . apixaban  5 mg Oral BID  . carvedilol  6.25 mg Oral BID WC  . chlorhexidine  15 mL Mouth/Throat BID  . Chlorhexidine Gluconate Cloth  6 each Topical Daily  . feeding supplement (ENSURE ENLIVE)  237 mL Oral Q2000  . gabapentin  100 mg Oral QHS  . guaiFENesin  1,200 mg Oral BID   . insulin aspart  0-15 Units Subcutaneous TID WC  . insulin glargine  10 Units Subcutaneous Daily  . pantoprazole  40 mg Oral Daily  . polyethylene glycol  17 g Oral Daily  . QUEtiapine  25 mg Oral QHS  . sodium chloride flush  10-40 mL Intracatheter Q12H   Continuous: . sodium chloride Stopped (10/08/18 0255)   HCW:CBJSEG chloride, acetaminophen (TYLENOL) oral liquid 160 mg/5 mL, albuterol, haloperidol lactate, hydrALAZINE, lip balm, Resource ThickenUp Clear, sodium chloride flush   Objective:  Vital Signs  Vitals:   10/09/18 0000 10/09/18 0200 10/09/18 0400 10/09/18 0743  BP: (!) 146/64  (!) 165/70 131/84  Pulse: 78  80 90  Resp: 20  18   Temp:   98.1 F (36.7 C) 98.2 F (36.8 C)  TempSrc:   Oral Oral  SpO2: 92% 90% 92%  97%  Height:        Intake/Output Summary (Last 24 hours) at 10/09/2018 1000 Last data filed at 10/09/2018 0720 Gross per 24 hour  Intake 126.9 ml  Output 750 ml  Net -623.1 ml   Filed Weights    General appearance: Awake alert.  In no distress Resp: Normal effort.  Good air entry bilaterally.  Few crackles at the bases. Cardio: S1-S2 is normal regular.  No S3-S4.  No rubs murmurs or bruit GI: Abdomen is soft.  Nontender nondistended.  Bowel sounds are present normal.  No masses organomegaly Extremities: No edema.  Full range of motion of lower extremities. Neurologic: No focal neurological deficits.    Lab Results:  Data Reviewed: I have personally reviewed following labs and imaging studies  CBC: Recent Labs  Lab 10/03/18 0205 10/05/18 0550 10/06/18 0430 10/07/18 0447 10/08/18 0255  WBC 15.4* 16.6* 17.2* 14.5* 14.9*  NEUTROABS 11.2*  --   --   --   --   HGB 8.2* 7.8* 11.4* 8.2* 8.6*  HCT 28.2* 26.0* 37.8 26.8* 28.1*  MCV 102.9* 103.6* 99.0 101.1* 100.7*  PLT 535* 332 344 347 277    Basic Metabolic Panel: Recent Labs  Lab 10/05/18 0550 10/06/18 0430 10/07/18 0447 10/08/18 0255 10/09/18 0415  NA 140 135 141 139 138  K 3.7  4.6 4.2 4.3 3.7  CL 107 101 106 101 100  CO2 27 26 29 29 30   GLUCOSE 110* 305* 199* 113* 167*  BUN 30* 26* 23 21 18   CREATININE 1.20* 1.26* 1.05* 1.01* 1.00  CALCIUM 7.6* 7.6* 7.8* 7.8* 7.9*  MG  --   --   --  1.4*  --     GFR: Estimated Creatinine Clearance: 45.7 mL/min (by C-G formula based on SCr of 1 mg/dL).  Liver Function Tests: Recent Labs  Lab 10/03/18 0205  AST 24  ALT 22  ALKPHOS 41  BILITOT 0.4  PROT 5.2*  ALBUMIN 2.0*    CBG: Recent Labs  Lab 10/08/18 1101 10/08/18 1643 10/08/18 2029 10/08/18 2104 10/09/18 0826  GLUCAP 184* 268* 420* 208* 216*     Recent Results (from the past 240 hour(s))  Culture, blood (Routine X 2) w Reflex to ID Panel     Status: None (Preliminary result)   Collection Time: 10/04/18  1:00 PM  Result Value Ref Range Status   Specimen Description BLOOD LEFT HAND  Final   Special Requests   Final    BOTTLES DRAWN AEROBIC AND ANAEROBIC Blood Culture adequate volume   Culture   Final    NO GROWTH 4 DAYS Performed at [facility], [place] [address], [place], [place] [address]    Report Status PENDING  Incomplete      Radiology Studies: No results found.     LOS: 30 days   Khaleed Holan [employer] on [email or web address]  10/09/2018, 10:00 AM

## 2018-10-09 NOTE — Progress Notes (Addendum)
Occupational Therapy Treatment Patient Details Name: Sabrina Baker MRN: [id number] DOB: [birth year] Today's Date: 10/09/2018    History of present illness 78 y.o. female with PMHx of HTN, DM, CKD stage III, presented with shortness of breath and cough, found to have acute hypoxic respiratory failure due to ARDS in the setting of COVID-19 infection. ETT 3/30-4/15, stridor and vocal fold edema; reintubated 4/15-4/20.     OT comments  Pt progressing towards established OT goals. Pt performing bed mobility with Min A in preparation for ADLs and to transfer to recliner. During bed mobility, pt reporting bowel urgency. Pt requiring Mod A +2 for sit<>stand with stedy to sit on bedpan. Pt requiring Max A for toilet hygiene after BM. Once in recliner, pt with second BM requiring to sit on bedpan again; bowel urgency too great to transfer to [facility]. During session, pt presenting with othrostatic hypotension with decreased BP and presents with pale and clammy skin. Continue to recommend dc to SNF and will continue to follow acutely as admitted.   BP during session: Sitting in bed 154/74 Sitting at EOB 80/71 Sitting in recliner after t/f 109/64 After 3 min in recliner 107/77 15 min in recliner 129/74 Sitting upright in recliner 143/66  After standing to go on bedpan in recliner 5 min 115/78  133/75 just after standing for cleaning   Follow Up Recommendations  SNF;Supervision/Assistance - 24 hour    Equipment Recommendations  Other (comment)    Recommendations for Other Services PT consult;Speech consult    Precautions / Restrictions Precautions Precautions: Fall Precaution Comments: monitor ,esp BP,has shoes to wear Restrictions Weight Bearing Restrictions: No       Mobility Bed Mobility Overal bed mobility: Needs Assistance Bed Mobility: Supine to Sit     Supine to sit: HOB elevated;+2 for safety/equipment;Min assist     General bed mobility comments: Min A to pull into  sitting  Transfers Overall transfer level: Needs assistance Equipment used: Ambulation equipment used Transfers: Sit to/from Stand Sit to Stand: Mod assist;+2 physical assistance;+2 safety/equipment;From elevated surface         General transfer comment: Mod A +2 to power up into standing. Performing sit<>Stand several times due to multiple bowel movements    Balance Overall balance assessment: Needs assistance Sitting-balance support: Bilateral upper extremity supported;Feet supported Sitting balance-Leahy Scale: Fair Sitting balance - Comments: required stability sitting upright in recliner.     Standing balance-Leahy Scale: Poor Standing balance comment: Continues to require UE support and physical A                           ADL either performed or assessed with clinical judgement   ADL Overall ADL's : Needs assistance/impaired Eating/Feeding: Sitting;Set up;Supervision/ safety Eating/Feeding Details (indicate cue type and reason): Increased time required                     Toilet Transfer: +2 for physical assistance;Moderate assistance(Sit<>stand with Charlaine Dalton) Toilet Transfer Details (indicate cue type and reason): Pt with bowel urgency. Pt able to sit<>stand with Mod A +2 to power up and then sit back on bedpan. Pt then transfering to recliner. While in recliner, pt requiring to sit<>stand again to sit on bed pan due to a second large BM Toileting- Clothing Manipulation and Hygiene: Maximal assistance;Sit to/from stand;+2 for physical assistance Toileting - Clothing Manipulation Details (indicate cue type and reason): Pt maintaining standing with increased tolerance but required Max for toilet  hygiene. Pt also becoming orthostatic     Functional mobility during ADLs: Cueing for safety;Cueing for sequencing;Moderate assistance;+2 for physical assistance(sit<>stand with steady) General ADL Comments: Using stedy for sit<>Stand. Pt with multiple BM      Vision   Vision Assessment?: Vision impaired- to be further tested in functional context Additional Comments: Has glasses in the room but pt not wanting to wear them   Perception     Praxis      Cognition Arousal/Alertness: Awake/alert Behavior During Therapy: WFL for tasks assessed/performed Overall Cognitive Status: Impaired/Different from baseline Area of Impairment: Attention;Safety/judgement;Awareness;Problem solving                   Current Attention Level: Sustained Memory: Decreased short-term memory Following Commands: Follows multi-step commands consistently Safety/Judgement: Decreased awareness of safety;Decreased awareness of deficits Awareness: Emergent Problem Solving: Slow processing;Requires verbal cues General Comments: Pt continues to requiring increased time for processing and cues to problem solve. Pt with increased awareness and attention this session.        Exercises     Shoulder Instructions       General Comments HR 110s with activity. SpO2 96%-100%.  BP: sitting in bed 154/7, sitting at EOB 80/71, Sitting in recliner after t/f 109/64, After 3 min in recliner 107/77, 15 min in recliner 129/74, 143/66 sitting upright in recliner, 115/78 after standing to go on bedpan in recliner 5 min    Pertinent Vitals/ Pain       Pain Assessment: Faces Faces Pain Scale: Hurts little more Pain Location: right shoulder Pain Descriptors / Indicators: Sore Pain Intervention(s): Monitored during session;Repositioned  Home Living                                          Prior Functioning/Environment              Frequency  Min 3X/week        Progress Toward Goals  OT Goals(current goals can now be found in the care plan section)  Progress towards OT goals: Progressing toward goals  Acute Rehab OT Goals Patient Stated Goal: to get stronger OT Goal Formulation: With patient/family Time For Goal Achievement:  10/15/18 Potential to Achieve Goals: Good ADL Goals Pt Will Perform Grooming: with set-up;with supervision;sitting Pt Will Perform Lower Body Dressing: with mod assist;sit to/from stand;sitting/lateral leans Pt Will Transfer to Toilet: with mod assist;stand pivot transfer;bedside commode Pt Will Perform Toileting - Clothing Manipulation and hygiene: with mod assist;sit to/from stand;sitting/lateral leans Additional ADL Goal #1: Pt will perform bed mobility with Min A in preparation for ADLs Additional ADL Goal #2: Pt willl sustain attention to ADL with Min cues  Plan Discharge plan remains appropriate    Co-evaluation    PT/OT/SLP Co-Evaluation/Treatment: Yes Reason for Co-Treatment: Complexity of the patient's impairments (multi-system involvement);For patient/therapist safety;To address functional/ADL transfers   OT goals addressed during session: ADL's and self-care      AM-PAC OT "6 Clicks" Daily Activity     Outcome Measure   Help from another person eating meals?: A Little Help from another person taking care of personal grooming?: A Lot Help from another person toileting, which includes using toliet, bedpan, or urinal?: A Lot Help from another person bathing (including washing, rinsing, drying)?: A Lot Help from another person to put on and taking off regular upper body clothing?: A Lot Help from another person  to put on and taking off regular lower body clothing?: A Lot 6 Click Score: 13    End of Session Equipment Utilized During Treatment: Oxygen  OT Visit Diagnosis: Unsteadiness on feet (R26.81);Other abnormalities of gait and mobility (R26.89);Muscle weakness (generalized) (M62.81);Other symptoms and signs involving cognitive function;Pain Pain - Right/Left: Right Pain - part of body: Arm;Hand   Activity Tolerance Patient tolerated treatment well   Patient Left in chair;with call bell/phone within reach;Other (comment)(Baby monitor set up)   Nurse Communication  Other (comment)(Othrostatic hypotension with mobility)        Time: 1980-2217 OT Time Calculation (min): 65 min  Charges: OT General Charges $OT Visit: 1 Visit OT Treatments $Self Care/Home Management : 23-37 mins  [place], OTR/L Acute Rehab Pager: [phone] Office: [place] 10/09/2018, 2:23 PM

## 2018-10-09 NOTE — Progress Notes (Signed)
Physical Therapy Treatment Patient Details Name: Sabrina Baker MRN: [id number] DOB: 1940-08-06 Today's Date: 10/09/2018    History of Present Illness 78 y.o. female with PMHx of HTN, DM, CKD stage III, presented with shortness of breath and cough, found to have acute hypoxic respiratory failure due to ARDS in the setting of COVID-19 infection. ETT 3/30-4/15, stridor and vocal fold edema; reintubated 4/15-4/20.      PT Comments    The patient is noted to have drop in  When standing. BP supine 157/74 to 80/71 when sitting.Sats on 2 L. >95%, HR up to 105 when mobilizing. Patient is noted to be pale and slightly diaphoretic. Patient continues to improve in mobility. Patient states that she wants to go to [place] for rehab.  Follow Up Recommendations  SNF     Equipment Recommendations  None recommended by PT    Recommendations for Other Services       Precautions / Restrictions Precautions Precautions: Fall Precaution Comments: monitor ,esp BP, has been orthostatic,has shoes to wear Restrictions Weight Bearing Restrictions: No    Mobility  Bed Mobility Overal bed mobility: Needs Assistance Bed Mobility: Supine to Sit Rolling: Min assist   Supine to sit: Min assist;Mod assist Sit to supine: Max assist;+2 for physical assistance   General bed mobility comments: assist with trunk to sitting  Transfers Overall transfer level: Needs assistance Equipment used: Ambulation equipment used Transfers: Sit to/from Stand Sit to Stand: Mod assist;+2 physical assistance;+2 safety/equipment;From elevated surface         General transfer comment: Mod A +2 to power up into standing. Maintain more upright posture in standing this session, stood x 4, Bedpan placed  .STEDY for transfer to recl;iner, mod/max assist to srtand at stedy from recliner for bedpan to be placed. .   Ambulation/Gait                 Stairs             Wheelchair Mobility    Modified Rankin  (Stroke Patients Only)       Balance Overall balance assessment: Needs assistance Sitting-balance support: Bilateral upper extremity supported;Feet supported Sitting balance-Leahy Scale: Fair Sitting balance - Comments: required stability sitting upright in recliner.     Standing balance-Leahy Scale: Poor Standing balance comment: Continues to require UE support and physical A                            Cognition Arousal/Alertness: Awake/alert Behavior During Therapy: WFL for tasks assessed/performed Overall Cognitive Status: Impaired/Different from baseline Area of Impairment: Attention;Safety/judgement;Awareness;Problem solving                   Current Attention Level: Sustained Memory: Decreased short-term memory Following Commands: Follows multi-step commands consistently Safety/Judgement: Decreased awareness of safety;Decreased awareness of deficits Awareness: Emergent Problem Solving: Slow processing;Requires verbal cues General Comments: Pt continues to requiring increased time for processing and cues to problem solve. Pt with increased awareness and attention this session.      Exercises      General Comments General comments (skin integrity, edema, etc.): BP 132/65 during toileting and 130/80 supine in bed.      Pertinent Vitals/Pain Pain Assessment: Faces Faces Pain Scale: Hurts little more Pain Location: right shoulder Pain Descriptors / Indicators: Sore Pain Intervention(s): Monitored during session;Repositioned    Home Living  Prior Function            PT Goals (current goals can now be found in the care plan section) Acute Rehab PT Goals Patient Stated Goal: to get stronger Progress towards PT goals: Progressing toward goals    Frequency    Min 3X/week      PT Plan Current plan remains appropriate    Co-evaluation PT/OT/SLP Co-Evaluation/Treatment: Yes Reason for Co-Treatment: For  patient/therapist safety PT goals addressed during session: Mobility/safety with mobility OT goals addressed during session: ADL's and self-care      AM-PAC PT "6 Clicks" Mobility   Outcome Measure  Help needed turning from your back to your side while in a flat bed without using bedrails?: A Lot Help needed moving from lying on your back to sitting on the side of a flat bed without using bedrails?: A Lot Help needed moving to and from a bed to a chair (including a wheelchair)?: Total Help needed standing up from a chair using your arms (e.g., wheelchair or bedside chair)?: Total Help needed to walk in hospital room?: Total Help needed climbing 3-5 steps with a railing? : Total 6 Click Score: 8    End of Session Equipment Utilized During Treatment: Gait belt Activity Tolerance: Treatment limited secondary to medical complications (Comment) Patient left: in chair;with call bell/phone within reach Nurse Communication: Mobility status(low BP, 2 BMs) PT Visit Diagnosis: Muscle weakness (generalized) (M62.81);Difficulty in walking, not elsewhere classified (R26.2)     Time: 5400-8676 PT Time Calculation (min) (ACUTE ONLY): 59 min  Charges:  $Therapeutic Activity: 23-37 mins                     Sabrina Baker PT Acute Rehabilitation Services Pager [phone] Office [phone]    Sabrina Baker 10/09/2018, 5:14 PM

## 2018-10-09 NOTE — Progress Notes (Signed)
Ordered TED hose from materials. Hose will be delivered to [facility] via [place]. Will await hose to be delivered

## 2018-10-09 NOTE — Progress Notes (Signed)
Occupational Therapy Treatment Patient Details Name: Sabrina Baker MRN: [id number] DOB: 10-23-1940 Today's Date: 10/09/2018    History of present illness 78 y.o. female with PMHx of HTN, DM, CKD stage III, presented with shortness of breath and cough, found to have acute hypoxic respiratory failure due to ARDS in the setting of COVID-19 infection. ETT 3/30-4/15, stridor and vocal fold edema; reintubated 4/15-4/20.     OT comments  Return for second visit to assist in toilet as pt having another BM this AM and wanting to return to bed after ~3hours in recliner. Pt requiring Mod A +2 for sit<>stand with stedy and Max A for toilet hygiene. Continued to present with fatigue and diaphoretic with transfers. BP 132/65 after toileting and 130/65 supine in bed. Continue to recommend dc to post-acute rehab and will continue to follow acutely as admitted.    Follow Up Recommendations  SNF;Supervision/Assistance - 24 hour    Equipment Recommendations  Other (comment)    Recommendations for Other Services PT consult;Speech consult    Precautions / Restrictions Precautions Precautions: Fall Precaution Comments: monitor ,esp BP,has shoes to wear Restrictions Weight Bearing Restrictions: No       Mobility Bed Mobility Overal bed mobility: Needs Assistance Bed Mobility: Sit to Supine     Supine to sit: HOB elevated;+2 for safety/equipment;Min assist Sit to supine: Max assist;+2 for physical assistance   General bed mobility comments: Max A +2 to quickly return to supine due to othrostatics.   Transfers Overall transfer level: Needs assistance Equipment used: Ambulation equipment used Transfers: Sit to/from Stand Sit to Stand: Mod assist;+2 physical assistance;+2 safety/equipment;From elevated surface         General transfer comment: Mod A +2 to power up into standing. Maintain more upright posture in standing this session    Balance Overall balance assessment: Needs  assistance Sitting-balance support: Bilateral upper extremity supported;Feet supported Sitting balance-Leahy Scale: Fair Sitting balance - Comments: required stability sitting upright in recliner.     Standing balance-Leahy Scale: Poor Standing balance comment: Continues to require UE support and physical A                           ADL either performed or assessed with clinical judgement   ADL Overall ADL's : Needs assistance/impaired Eating/Feeding: Sitting;Set up;Supervision/ safety Eating/Feeding Details (indicate cue type and reason): Increased time required                     Toilet Transfer: +2 for physical assistance;Moderate assistance(Sit<>stand with Charlaine Dalton) Toilet Transfer Details (indicate cue type and reason): Pt with bowel urgency. Pt able to sit<>stand with Mod A +2 to power up and then sit back on bedpan. Pt then transfering to recliner. While in recliner, pt requiring to sit<>stand again to sit on bed pan due to a second large BM Toileting- Clothing Manipulation and Hygiene: Maximal assistance;Sit to/from stand;+2 for physical assistance Toileting - Clothing Manipulation Details (indicate cue type and reason): Pt maintaining standing with increased tolerance but required Max for toilet hygiene. Pt also becoming orthostatic     Functional mobility during ADLs: Cueing for safety;Cueing for sequencing;Moderate assistance;+2 for physical assistance(sit<>stand with steady) General ADL Comments: Returning for second visit to assist nsg with toileting as pt had a third BM this AM. Pt requiring Mod A for sit<>stand with stedy and then able to maintain standing for cleaning. Requiring Max A for toilet hygiene. Pt BP stable but continued to  present with fatigue and diaphoretic.      Vision   Vision Assessment?: Vision impaired- to be further tested in functional context Additional Comments: Has glasses in the room but pt not wanting to wear them   Perception      Praxis      Cognition Arousal/Alertness: Awake/alert Behavior During Therapy: WFL for tasks assessed/performed Overall Cognitive Status: Impaired/Different from baseline Area of Impairment: Attention;Safety/judgement;Awareness;Problem solving                   Current Attention Level: Sustained Memory: Decreased short-term memory Following Commands: Follows multi-step commands consistently Safety/Judgement: Decreased awareness of safety;Decreased awareness of deficits Awareness: Emergent Problem Solving: Slow processing;Requires verbal cues General Comments: Pt continues to requiring increased time for processing and cues to problem solve. Pt with increased awareness and attention this session.        Exercises     Shoulder Instructions       General Comments BP 132/65 during toileting and 130/80 supine in bed.    Pertinent Vitals/ Pain       Pain Assessment: Faces Faces Pain Scale: Hurts little more Pain Location: right shoulder Pain Descriptors / Indicators: Sore Pain Intervention(s): Monitored during session;Repositioned  Home Living                                          Prior Functioning/Environment              Frequency  Min 3X/week        Progress Toward Goals  OT Goals(current goals can now be found in the care plan section)  Progress towards OT goals: Progressing toward goals  Acute Rehab OT Goals Patient Stated Goal: to get stronger OT Goal Formulation: With patient/family Time For Goal Achievement: 10/15/18 Potential to Achieve Goals: Good ADL Goals Pt Will Perform Grooming: with set-up;with supervision;sitting Pt Will Perform Lower Body Dressing: with mod assist;sit to/from stand;sitting/lateral leans Pt Will Transfer to Toilet: with mod assist;stand pivot transfer;bedside commode Pt Will Perform Toileting - Clothing Manipulation and hygiene: with mod assist;sit to/from stand;sitting/lateral leans Additional  ADL Goal #1: Pt will perform bed mobility with Min A in preparation for ADLs Additional ADL Goal #2: Pt willl sustain attention to ADL with Min cues  Plan Discharge plan remains appropriate    Co-evaluation    PT/OT/SLP Co-Evaluation/Treatment: Yes Reason for Co-Treatment: Complexity of the patient's impairments (multi-system involvement);For patient/therapist safety;To address functional/ADL transfers   OT goals addressed during session: ADL's and self-care      AM-PAC OT "6 Clicks" Daily Activity     Outcome Measure   Help from another person eating meals?: A Little Help from another person taking care of personal grooming?: A Lot Help from another person toileting, which includes using toliet, bedpan, or urinal?: A Lot Help from another person bathing (including washing, rinsing, drying)?: A Lot Help from another person to put on and taking off regular upper body clothing?: A Lot Help from another person to put on and taking off regular lower body clothing?: A Lot 6 Click Score: 13    End of Session Equipment Utilized During Treatment: Oxygen  OT Visit Diagnosis: Unsteadiness on feet (R26.81);Other abnormalities of gait and mobility (R26.89);Muscle weakness (generalized) (M62.81);Other symptoms and signs involving cognitive function;Pain Pain - Right/Left: Right Pain - part of body: Arm;Hand   Activity Tolerance Patient tolerated treatment well;Other (comment)(Orthostatic)  Patient Left Other (comment);in bed;with bed alarm set;with nursing/sitter in room(Baby monitor set up)   Nurse Communication Other (comment);Mobility status(Othrostatic hypotension with mobility)        Time: 6761-9509 OT Time Calculation (min): 14 min  Charges: OT General Charges $OT Visit: 1 Visit OT Treatments $Self Care/Home Management : 8-22 mins  Lipan, OTR/L Acute Rehab Pager: [phone] Office: [place] 10/09/2018, 2:40 PM

## 2018-10-09 NOTE — Progress Notes (Signed)
Spoke to patient's granddaughter, Sabrina Baker per patient's approval. Gave her updates on her grandmother. Sabrina Baker states that she would update [name], Patient's daughter on patient's condition.  Answered all questions and concerns regarding patient. Will try to call patient's daughter

## 2018-10-09 NOTE — Progress Notes (Signed)
Tried to call patient's daughter, Arrie Aran at [phone]. There was no answer and no voice mail to leave a message.

## 2018-10-09 NOTE — Progress Notes (Signed)
Physical Therapy Treatment Patient Details Name: Sabrina Baker MRN: [id number] DOB: 09-Jan-1941 Today's Date: 10/09/2018    History of Present Illness 78 y.o. female with PMHx of HTN, DM, CKD stage III, presented with shortness of breath and cough, found to have acute hypoxic respiratory failure due to ARDS in the setting of COVID-19 infection. ETT 3/30-4/15, stridor and vocal fold edema; reintubated 4/15-4/20.      PT Comments    Patient on [facility] by nursing. Patient assisted to standing at [facility] and then assisted to bed . Patient reports feeling weak, noted pale. BP 132/65. Patient on 2 L [place]. Continue PT.   Follow Up Recommendations  SNF     Equipment Recommendations  None recommended by PT    Recommendations for Other Services       Precautions / Restrictions Precautions Precautions: Fall Precaution Comments: monitor ,esp BP, has been orthostatic,has shoes to wear Restrictions Weight Bearing Restrictions: No    Mobility  Bed Mobility Overal bed mobility: Needs Assistance Bed Mobility: Sit to Supine Rolling: Min assist   Sit to supine: Max assist;+2 for physical assistance   General bed mobility comments: patient noted to be pale after sitting on BSC so returned to supine quickly  Transfers Overall transfer level: Needs assistance Equipment used: Ambulation equipment used Transfers: Sit to/from Stand Sit to Stand: Mod assist;+2 physical assistance;+2 safety/equipment;From elevated surface         General transfer comment: Mod A +2 to power up into standing. Maintain more upright posture in standing this session, stood x 3,   .STEDY for transfer to x assist to srtand at stedy from [facility]    Ambulation/Gait                 Stairs             Wheelchair Mobility    Modified Rankin (Stroke Patients Only)       Balance Overall balance assessment: Needs assistance Sitting-balance support: Bilateral upper extremity supported;Feet  supported Sitting balance-Leahy Scale: Fair Sitting balance - Comments: required stability sitting upright in recliner.     Standing balance-Leahy Scale: Poor Standing balance comment: Continues to require UE support and physical A                            Cognition Arousal/Alertness: Awake/alert Behavior During Therapy: WFL for tasks assessed/performed Overall Cognitive Status: Impaired/Different from baseline Area of Impairment: Attention;Safety/judgement;Awareness;Problem solving                   Current Attention Level: Sustained Memory: Decreased short-term memory Following Commands: Follows multi-step commands consistently Safety/Judgement: Decreased awareness of safety;Decreased awareness of deficits Awareness: Emergent Problem Solving: Slow processing;Requires verbal cues General Comments: Pt continues to requiring increased time for processing and cues to problem solve. Pt with increased awareness and attention this session.      Exercises      General Comments General comments (skin integrity, edema, etc.): BP 132/65 during toileting and 130/80 supine in bed.      Pertinent Vitals/Pain Pain Assessment: Faces Faces Pain Scale: Hurts little more Pain Location: right shoulder Pain Descriptors / Indicators: Sore Pain Intervention(s): Monitored during session;Repositioned    Home Living                      Prior Function            PT Goals (current goals can now be  found in the care plan section) Acute Rehab PT Goals Patient Stated Goal: to get stronger Progress towards PT goals: Progressing toward goals    Frequency    Min 3X/week      PT Plan Current plan remains appropriate    Co-evaluation PT/OT/SLP Co-Evaluation/Treatment: Yes Reason for Co-Treatment: For patient/therapist safety PT goals addressed during session: Mobility/safety with mobility OT goals addressed during session: ADL's and self-care       AM-PAC PT "6 Clicks" Mobility   Outcome Measure  Help needed turning from your back to your side while in a flat bed without using bedrails?: A Lot Help needed moving from lying on your back to sitting on the side of a flat bed without using bedrails?: A Lot Help needed moving to and from a bed to a chair (including a wheelchair)?: Total Help needed standing up from a chair using your arms (e.g., wheelchair or bedside chair)?: Total Help needed to walk in hospital room?: Total Help needed climbing 3-5 steps with a railing? : Total 6 Click Score: 8    End of Session Equipment Utilized During Treatment: Gait belt Activity Tolerance: Treatment limited secondary to medical complications (Comment) Patient left: in bed;with call bell/phone within reach;with nursing/sitter in room Nurse Communication: Mobility status PT Visit Diagnosis: Muscle weakness (generalized) (M62.81);Difficulty in walking, not elsewhere classified (R26.2)     Time: 8185-9093 PT Time Calculation (min) (ACUTE ONLY): 23 min  Charges:  $Therapeutic Activity: 8-22 mins                     Tresa Endo PT Acute Rehabilitation Services Pager [phone] Office [phone]    Claretha Cooper 10/09/2018, 5:24 PM

## 2018-10-10 LAB — MAGNESIUM: Magnesium: 2 mg/dL (ref 1.7–2.4)

## 2018-10-10 LAB — GLUCOSE, CAPILLARY
Glucose-Capillary: 153 mg/dL — ABNORMAL HIGH (ref 70–99)
Glucose-Capillary: 143 mg/dL — ABNORMAL HIGH (ref 70–99)
Glucose-Capillary: 224 mg/dL — ABNORMAL HIGH (ref 70–99)
Glucose-Capillary: 237 mg/dL — ABNORMAL HIGH (ref 70–99)
Glucose-Capillary: 154 mg/dL — ABNORMAL HIGH (ref 70–99)

## 2018-10-10 LAB — BASIC METABOLIC PANEL
Anion gap: 7 (ref 5–15)
BUN: 19 mg/dL (ref 8–23)
CO2: 28 mmol/L (ref 22–32)
Calcium: 8.3 mg/dL — ABNORMAL LOW (ref 8.9–10.3)
Chloride: 104 mmol/L (ref 98–111)
Creatinine, Ser: 0.98 mg/dL (ref 0.44–1.00)
GFR calc Af Amer: >60 mL/min (ref >60)
GFR calc non Af Amer: 56 mL/min — ABNORMAL LOW (ref >60)
Glucose, Bld: 169 mg/dL — ABNORMAL HIGH (ref 70–99)
Potassium: 4.2 mmol/L (ref 3.5–5.1)
Sodium: 139 mmol/L (ref 135–145)

## 2018-10-10 LAB — NOVEL CORONAVIRUS, NAA (HOSP ORDER, SEND-OUT TO REF LAB; TAT 18-24 HRS): SARS-CoV-2, NAA: NOT DETECTED

## 2018-10-10 NOTE — Progress Notes (Signed)
Occupational Therapy Treatment Patient Details Name: Sabrina Baker MRN: [id number] DOB: August 07, 1940 Today's Date: 10/10/2018    History of present illness 78 y.o. female with PMHx of HTN, DM, CKD stage III, presented with shortness of breath and cough, found to have acute hypoxic respiratory failure due to ARDS in the setting of COVID-19 infection. ETT 3/30-4/15, stridor and vocal fold edema; reintubated 4/15-4/20.     OT comments  Continues to make significant progress.Able to transfer to [facility] with stble VSS. BP 133/72; 154/74; 128/64; 144/79 throughout session.  Pt continues to improve cognitively with increased participation with ADL. Continue to recommend [facility] for rehab.   Follow Up Recommendations  SNF;Supervision/Assistance - 24 hour    Equipment Recommendations  Other (comment)    Recommendations for Other Services PT consult;Speech consult    Precautions / Restrictions Precautions Precautions: Fall Precaution Comments: monitor ,esp BP, has been orthostatic,has shoes to wear, generally needs BSC after eating. Using stedy so far, has hand deformities, Charcot  feet       Mobility Bed Mobility   Bed Mobility: Supine to Sit           General bed mobility comments: OOB in chair  Transfers Overall transfer level: Needs assistance     Sit to Stand: Mod assist         General transfer comment: with use of Stedy    Balance     Sitting balance-Leahy Scale: Fair       Standing balance-Leahy Scale: Poor                             ADL either performed or assessed with clinical judgement   ADL Overall ADL's : Needs assistance/impaired Eating/Feeding: Sitting;Set up;Supervision/ safety   Grooming: Moderate assistance;Sitting                   Toilet Transfer: Moderate assistance(use of Stedy; increased ability to stand upright and tuck bo)   Toileting- Clothing Manipulation and Hygiene: Maximal assistance;Sit to/from stand;+2 for physical  assistance       Functional mobility during ADLs: Moderate assistance General ADL Comments: Use of Stedy for toileting. BP stable throughout     Vision       Perception     Praxis      Cognition Arousal/Alertness: Awake/alert Behavior During Therapy: WFL for tasks assessed/performed Overall Cognitive Status: Impaired/Different from baseline Area of Impairment: Attention;Safety/judgement;Awareness                   Current Attention Level: Selective   Following Commands: Follows one step commands consistently Safety/Judgement: Decreased awareness of safety;Decreased awareness of deficits Awareness: Emergent Problem Solving: Slow processing General Comments: continues to improve; likes to joke around        Exercises General Exercises - Upper Extremity Shoulder Flexion: Strengthening;Both;15 reps;Supine Elbow Flexion: Both;15 reps;Seated Elbow Extension: Both;15 reps;Seated Digit Composite Flexion: Strengthening;15 reps;Squeeze ball Composite Extension: AROM;Both;10 reps   Shoulder Instructions       General Comments      Pertinent Vitals/ Pain       Pain Assessment: No/denies pain Faces Pain Scale: Hurts little more Pain Location: back Pain Descriptors / Indicators: Sore Pain Intervention(s): Monitored during session;Repositioned  Home Living  Prior Functioning/Environment              Frequency  Min 3X/week        Progress Toward Goals  OT Goals(current goals can now be found in the care plan section)  Progress towards OT goals: Progressing toward goals  Acute Rehab OT Goals Patient Stated Goal: to get stronger OT Goal Formulation: With patient/family Time For Goal Achievement: 10/15/18 Potential to Achieve Goals: Good ADL Goals Pt Will Perform Grooming: with set-up;with supervision;sitting Pt Will Perform Lower Body Dressing: with mod assist;sit to/from  stand;sitting/lateral leans Pt Will Transfer to Toilet: with mod assist;stand pivot transfer;bedside commode Pt Will Perform Toileting - Clothing Manipulation and hygiene: with mod assist;sit to/from stand;sitting/lateral leans Additional ADL Goal #1: Pt will perform bed mobility with Min A in preparation for ADLs Additional ADL Goal #2: Pt willl sustain attention to ADL with Min cues  Plan Discharge plan remains appropriate    Co-evaluation                 AM-PAC OT "6 Clicks" Daily Activity     Outcome Measure   Help from another person eating meals?: A Little Help from another person taking care of personal grooming?: A Lot Help from another person toileting, which includes using toliet, bedpan, or urinal?: A Lot Help from another person bathing (including washing, rinsing, drying)?: A Lot Help from another person to put on and taking off regular upper body clothing?: A Lot Help from another person to put on and taking off regular lower body clothing?: A Lot 6 Click Score: 13    End of Session Equipment Utilized During Treatment: Gait belt  OT Visit Diagnosis: Unsteadiness on feet (R26.81);Other abnormalities of gait and mobility (R26.89);Muscle weakness (generalized) (M62.81);Other symptoms and signs involving cognitive function;Pain Pain - Right/Left: Right Pain - part of body: Arm;Hand   Activity Tolerance Patient tolerated treatment well   Patient Left in chair;with call bell/phone within reach   Nurse Communication Mobility status;Need for lift equipment(Stedy)        Time: 9381-8299 OT Time Calculation (min): 60 min  Charges: OT General Charges $OT Visit: 1 Visit OT Treatments $Self Care/Home Management : 53-67 mins  Maurie Boettcher, OT/L   Acute OT Clinical Specialist [place] Pager [phone] Office [phone]    [facility] 10/10/2018, 1:51 PM

## 2018-10-10 NOTE — Progress Notes (Signed)
PROGRESS NOTE  Sabrina Baker YJE:[id number] DOB: 03/02/1941 DOA: 09/09/2018  PCP: [name], MD  Brief History/Interval Summary: 78 y.o.femalewith PMHx of HTN, DM, CKD stage III, presented with shortness of breath and cough, found to have acute hypoxic respiratory failure due to ARDS in the setting of COVID-19 infection.  3/30Admitted, intubated 3/21 R UE PICC 3/31>4/5 Plaquenil 4/13 transfer to [facility] 4/15 Extubated 4/15 Stridor-cord edema>reintubated 4/21 extubated  Reason for Visit: Pneumonia due to COVID-19  Consultants: She was seen by critical care medicine  Procedures:  3/30>> ETT 3/21>> RUE PICC 4/10>> A line-out 4/15>>extubated 4/15>>ETT (re-intubated) 4/20>> Extubated  Antibiotics: Anti-infectives (From admission, onward)   Start     Dose/Rate Route Frequency Ordered Stop   09/27/18 1300  cefTRIAXone (ROCEPHIN) 2 g in sodium chloride 0.9 % 100 mL IVPB     2 g 200 mL/hr over 30 Minutes Intravenous Every 24 hours 09/27/18 0928 10/03/18 1015   09/26/18 1400  ampicillin (OMNIPEN) 1 g in sodium chloride 0.9 % 100 mL IVPB     1 g 300 mL/hr over 20 Minutes Intravenous Every 8 hours 09/26/18 1204 10/03/18 2128   09/26/18 1300  cefTRIAXone (ROCEPHIN) 1 g in sodium chloride 0.9 % 100 mL IVPB  Status:  Discontinued     1 g 200 mL/hr over 30 Minutes Intravenous Every 24 hours 09/26/18 1204 09/27/18 0928   09/11/18 2200  hydroxychloroquine (PLAQUENIL) tablet 200 mg  Status:  Discontinued     200 mg Oral 2 times daily 09/10/18 2309 09/10/18 2319   09/11/18 2200  hydroxychloroquine (PLAQUENIL) tablet 200 mg     200 mg Per Tube 2 times daily 09/10/18 2319 09/15/18 0851   09/10/18 2330  hydroxychloroquine (PLAQUENIL) tablet 400 mg     400 mg Per Tube 2 times daily 09/10/18 2319 09/11/18 0823   09/10/18 2315  hydroxychloroquine (PLAQUENIL) tablet 400 mg  Status:  Discontinued     400 mg Oral 2 times daily 09/10/18 2309 09/10/18 2319   09/10/18 1300  azithromycin  (ZITHROMAX) 500 mg in sodium chloride 0.9 % 250 mL IVPB     500 mg 250 mL/hr over 60 Minutes Intravenous Every 24 hours 09/09/18 1342 09/15/18 1531   09/10/18 1200  cefTRIAXone (ROCEPHIN) 1 g in sodium chloride 0.9 % 100 mL IVPB  Status:  Discontinued     1 g 200 mL/hr over 30 Minutes Intravenous Every 24 hours 09/09/18 1342 09/14/18 1436   09/09/18 1200  cefTRIAXone (ROCEPHIN) 1 g in sodium chloride 0.9 % 100 mL IVPB     1 g 200 mL/hr over 30 Minutes Intravenous  Once 09/09/18 1148 09/09/18 1248   09/09/18 1200  azithromycin (ZITHROMAX) 500 mg in sodium chloride 0.9 % 250 mL IVPB     500 mg 250 mL/hr over 60 Minutes Intravenous  Once 09/09/18 1148 09/09/18 1351       Subjective/Interval History: Patient denies any complaints.  Breathing has significantly improved.  Occasional episodes of fatigue.  No nausea vomiting.  Denies any dizziness or lightheadedness.     Assessment/Plan:  Acute Hypoxic Resp. Failure due to Acute Covid 19 Viral Illness during the ongoing 2020 Covid 19 Pandemic -She  was initially extubated on 4/15, then she developed stridor secondary to cord edema, requiring reintubation the same day, she was successfully extubated on 4/21. -Continue incentive spirometry.  PT and OT is working with the patient.  Prone position as tolerated.   -She was diuresed.  Lasix has been discontinued due to orthostatic  hypotension. -She did receive Plaquenil during earlier part of this hospitalization. -Respiratory status is stable.  She is down to 1 to 2 L of oxygen by nasal cannula.  Orthostatic hypotension in the setting of essential hypertension Blood pressure noted to drop when she goes from supine to standing position.  Patient's diuretic and amlodipine were discontinued.  Continue carvedilol for now.  TED stockings.  Most likely this is due to generalized deconditioning.    UTI Urine culture growing both Citrobacter and enterococcus.  She has completed a course of  ceftriaxone.  Abnormal thyroid function tests TSH 0.551 with free T4 4.3 and free T3 0.8.  No known history of thyroid disease.  She did get levothyroxine briefly when she was in the ICU.  Subsequently discontinued.  Most likely sick euthyroid.  Will benefit from recheck in the next 4 to 6 weeks in the outpatient setting.  Fever Patient had fever 101.7 on 10/05/2018.  Septic work-up has been unremarkable.  No recurrence of fever.  Agitation/delirium Mentation has improved significantly.  Continue to monitor.    AKI on CKD stage III Renal function is at baseline.  Monitor urine output.    Paroxysmal atrial fibrillation Currently in normal sinus rhythm.  Continue carvedilol.  Continue Eliquis.    Macrocytic anemia No evidenceofovert bleeding-Ferritin 467-Folate 19.4-B12 2058.  Hemoglobin has been stable.    Diabetes mellitus type 2 with hyperglycemia A1c 7.3.    CBGs are reasonably well controlled.  Continue current SSI and Lantus dose.     Gout attack This involved the right thumb, and second left digit.  Significantly improved after she was started on colchicine and she also received 1 dose of IV Solu-Medrol.  Hypernatremia Resolved  Dysphagia Post extubation.  Speech therapy has been following.  Now on soft diet.  Code Status : Full code  Family Communication  :  Discussed with patient and her daughter.  Disposition Plan  :  Patient will need to go to skilled nursing facility for short-term rehab.  Waiting on a negative coronavirus test.  Barriers For Discharge :  Will need to go to skilled nursing facility.  As per Education officer, museum they will need a negative tests.  This was ordered yesterday.  Results are pending.   Medications:  Scheduled: . apixaban  5 mg Oral BID  . carvedilol  6.25 mg Oral BID WC  . chlorhexidine  15 mL Mouth/Throat BID  . Chlorhexidine Gluconate Cloth  6 each Topical Daily  . feeding supplement (ENSURE ENLIVE)  237 mL Oral Q2000   . gabapentin  100 mg Oral QHS  . guaiFENesin  1,200 mg Oral BID  . insulin aspart  0-15 Units Subcutaneous TID WC  . insulin glargine  10 Units Subcutaneous Daily  . pantoprazole  40 mg Oral Daily  . polyethylene glycol  17 g Oral Daily  . QUEtiapine  25 mg Oral QHS  . sodium chloride flush  10-40 mL Intracatheter Q12H   Continuous: . sodium chloride Stopped (10/08/18 0255)   ZYS:AYTKZS chloride, acetaminophen (TYLENOL) oral liquid 160 mg/5 mL, albuterol, haloperidol lactate, hydrALAZINE, lip balm, Resource ThickenUp Clear, sodium chloride flush   Objective:  Vital Signs  Vitals:   10/09/18 2021 10/10/18 0000 10/10/18 0400 10/10/18 0800  BP: (!) 153/78 (!) 143/65 134/69 112/84  Pulse: 90 77 77 79  Resp: (!) 21 16 15 15   Temp: 98.2 F (36.8 C) 97.7 F (36.5 C) 98.1 F (36.7 C) 97.6 F (36.4 C)  TempSrc: Oral Oral  Oral Oral  SpO2: 97% 99% 98% 95%  Height:        Intake/Output Summary (Last 24 hours) at 10/10/2018 0902 Last data filed at 10/10/2018 0645 Gross per 24 hour  Intake -  Output 1350 ml  Net -1350 ml   Filed Weights    General appearance: Awake alert.  In no distress Resp: Normal effort.  Coarse breath sounds bilaterally.  No obvious crackles or wheezing.   Cardio: S1-S2 is normal regular.  No S3-S4.  No rubs murmurs or bruit GI: Abdomen is soft.  Nontender nondistended.  Bowel sounds are present normal.  No masses organomegaly Extremities: No edema.  Full range of motion of lower extremities. Neurologic:  No focal neurological deficits.     Lab Results:  Data Reviewed: I have personally reviewed following labs and imaging studies  CBC: Recent Labs  Lab 10/05/18 0550 10/06/18 0430 10/07/18 0447 10/08/18 0255  WBC 16.6* 17.2* 14.5* 14.9*  HGB 7.8* 11.4* 8.2* 8.6*  HCT 26.0* 37.8 26.8* 28.1*  MCV 103.6* 99.0 101.1* 100.7*  PLT 332 344 347 299    Basic Metabolic Panel: Recent Labs  Lab 10/06/18 0430 10/07/18 0447 10/08/18 0255  10/09/18 0415 10/10/18 0400  NA 135 141 139 138 139  K 4.6 4.2 4.3 3.7 4.2  CL 101 106 101 100 104  CO2 26 29 29 30 28   GLUCOSE 305* 199* 113* 167* 169*  BUN 26* 23 21 18 19   CREATININE 1.26* 1.05* 1.01* 1.00 0.98  CALCIUM 7.6* 7.8* 7.8* 7.9* 8.3*  MG  --   --  1.4*  --  2.0    GFR: Estimated Creatinine Clearance: 46.7 mL/min (by C-G formula based on SCr of 0.98 mg/dL).  CBG: Recent Labs  Lab 10/09/18 0826 10/09/18 1230 10/09/18 1707 10/09/18 2128 10/10/18 0742  GLUCAP 216* 156* 153* 212* 143*     Recent Results (from the past 240 hour(s))  Culture, blood (Routine X 2) w Reflex to ID Panel     Status: None   Collection Time: 10/04/18  1:00 PM  Result Value Ref Range Status   Specimen Description BLOOD LEFT HAND  Final   Special Requests   Final    BOTTLES DRAWN AEROBIC AND ANAEROBIC Blood Culture adequate volume   Culture   Final    NO GROWTH 5 DAYS Performed at [facility], [place] [address], [place], [place] [address]    Report Status 10/09/2018 FINAL  Final      Radiology Studies: No results found.     LOS: 31 days   Cassiopeia Florentino [employer] on [email or web address]  10/10/2018, 9:02 AM

## 2018-10-10 NOTE — Progress Notes (Signed)
  Speech Language Pathology Treatment: Cognitive-Linquistic;Dysphagia  Patient Details Name: Sabrina Baker MRN: [id number] DOB: 1940-08-17 Today's Date: 10/10/2018 Time: 1500-1530 SLP Time Calculation (min) (ACUTE ONLY): 30 min  Assessment / Plan / Recommendation Clinical Impression  Pt is making daily gains with cognition, swallowing.  She was sitting in recliner upon entering room.  Consumed crackers, thin liquids with initial cues to be cautious/slow pacing.  There were no overt s/s of aspiration - voice clear, no cough, no throat clearing, no further GERD symptoms which were prevalent initially.  Pt's interactions are increasingly appropriate - she is beginning to demonstrate improved awareness and self-monitoring, asks appropriate questions.  She engaged in selective and alternating attention tasks with 80% accuracy and good retention of task instructions.  Difficulty was related to accurate recall of lists of information with tendency to repeat prior items from list. 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    HPI HPI: Patient is a 78 y.o. female with PMHx of HTN, DM, CKD stage III, presented with shortness of breath and cough, found to have acute hypoxic respiratory failure due to ARDS in the setting of COVID-19 infection. ETT 3/30-4/15, stridor and vocal fold edema; reintubated 4/15-4/20.       SLP Plan  Continue with current plan of care       Recommendations  Diet recommendations: Dysphagia 3 (mechanical soft);Thin liquid Liquids provided via: Straw Medication Administration: Whole meds with puree Supervision: Staff to assist with self feeding Compensations: Minimize environmental distractions Postural Changes and/or Swallow Maneuvers: Seated upright 90 degrees;Upright 30-60 min after meal                Oral Care Recommendations: Oral care  BID Follow up Recommendations: CIR Plan: Continue with current plan of care       GO                Juan Quam Laurice 10/10/2018, 3:39 PM   Khori Rosevear L. Tivis Ringer, [place] Office number [phone] Pager [phone]

## 2018-10-10 NOTE — Progress Notes (Signed)
Physical Therapy Treatment Patient Details Name: Sabrina Baker MRN: [id number] DOB: [birth year] Today's Date: 10/10/2018    History of Present Illness 78 y.o. female with PMHx of HTN, DM, CKD stage III, presented with shortness of breath and cough, found to have acute hypoxic respiratory failure due to ARDS in the setting of COVID-19 infection. ETT 3/30-4/15, stridor and vocal fold edema; reintubated 4/15-4/20.      PT Comments    The patient's BP dropped to 80/60 after sitting up, no c/o dizziness. Back up to 125/70. Patient is progressing well. Continue PT. Per RN, patient able to stand an pivot to [facility] without stedy.  Follow Up Recommendations  SNF     Equipment Recommendations  None recommended by PT    Recommendations for Other Services       Precautions / Restrictions Precautions Precautions: Fall Precaution Comments: monitor ,esp BP, has been orthostatic,has shoes to wear, generally needs BSC after eating. Using stedy so far, has hand deformities, Charcot  feet    Mobility  Bed Mobility   Bed Mobility: Supine to Sit     Supine to sit: Min assist;Mod assist     General bed mobility comments: assist  with trunk, much less  assist.  Transfers Overall transfer level: Needs assistance Equipment used: Ambulation equipment used Transfers: Sit to/from Stand Sit to Stand: Min assist;Mod assist         General transfer comment: min from bed, mod/mac from low BSC with stedy  Ambulation/Gait                 Stairs             Wheelchair Mobility    Modified Rankin (Stroke Patients Only)       Balance     Sitting balance-Leahy Scale: Fair       Standing balance-Leahy Scale: Poor                              Cognition Arousal/Alertness: Awake/alert Behavior During Therapy: WFL for tasks assessed/performed Overall Cognitive Status: Impaired/Different from baseline Area of Impairment: Attention;Safety/judgement;Awareness                   Current Attention Level: Selective   Following Commands: Follows one step commands consistently Safety/Judgement: Decreased awareness of safety;Decreased awareness of deficits Awareness: Emergent Problem Solving: Slow processing General Comments: continues to improve; likes to joke around      Exercises   General Comments        Pertinent Vitals/Pain Pain Assessment: No/denies pain Faces Pain Scale: Hurts little more Pain Location: back Pain Descriptors / Indicators: Sore Pain Intervention(s): Monitored during session;Repositioned    Home Living                      Prior Function            PT Goals (current goals can now be found in the care plan section) Acute Rehab PT Goals Patient Stated Goal: to get stronger Progress towards PT goals: Progressing toward goals    Frequency    Min 3X/week      PT Plan Current plan remains appropriate    Co-evaluation              AM-PAC PT "6 Clicks" Mobility   Outcome Measure  Help needed turning from your back to your side while in a flat bed without using bedrails?: A Little  Help needed moving from lying on your back to sitting on the side of a flat bed without using bedrails?: A Little Help needed moving to and from a bed to a chair (including a wheelchair)?: Total Help needed standing up from a chair using your arms (e.g., wheelchair or bedside chair)?: Total Help needed to walk in hospital room?: Total Help needed climbing 3-5 steps with a railing? : Total 6 Click Score: 10    End of Session Equipment Utilized During Treatment: Gait belt Activity Tolerance: Patient tolerated treatment well Patient left: in chair;with call bell/phone within reach Nurse Communication: Mobility status PT Visit Diagnosis: Muscle weakness (generalized) (M62.81);Difficulty in walking, not elsewhere classified (R26.2)     Time: 0900-1000 PT Time Calculation (min) (ACUTE ONLY): 60 min  Charges:   $Therapeutic Activity: 23-37 mins $Self Care/Home Management: [place] Pager [phone] Office [phone]    Claretha Cooper 10/10/2018, 1:59 PM

## 2018-10-10 NOTE — Progress Notes (Signed)
Pt was very alert and oriented on this shift. She is upbeat and eager to go home. Pt is now feedinger herself. She is able to discern when she needs to use the BR. She denies pain. Pt has had a few instances of low BP with sit to stand situations with a quick recovery to nml pressures. . Pt has been on no oxygen during this shift, her sats have been 92 - 98 on RA.

## 2018-10-10 NOTE — Plan of Care (Signed)
  Problem: Clinical Measurements: Goal: Ability to maintain clinical measurements within normal limits will improve Outcome: Progressing Goal: Will remain free from infection Outcome: Progressing Goal: Diagnostic test results will improve Outcome: Progressing Goal: Respiratory complications will improve Outcome: Progressing Goal: Cardiovascular complication will be avoided Outcome: Progressing   Problem: Activity: Goal: Risk for activity intolerance will decrease Outcome: Progressing   Problem: Coping: Goal: Level of anxiety will decrease Outcome: Progressing   Problem: Safety: Goal: Ability to remain free from injury will improve Outcome: Progressing   Problem: Skin Integrity: Goal: Risk for impaired skin integrity will decrease Outcome: Progressing   Problem: Activity: Goal: Ability to tolerate increased activity will improve Outcome: Progressing   Problem: Respiratory: Goal: Ability to maintain a clear airway and adequate ventilation will improve Outcome: Progressing   Problem: Respiratory: Goal: Will maintain a patent airway Outcome: Progressing Goal: Complications related to the disease process, condition or treatment will be avoided or minimized Outcome: Progressing

## 2018-10-11 ENCOUNTER — Encounter (INDEPENDENT_AMBULATORY_CARE_PROVIDER_SITE_OTHER): Payer: Medicare Other | Admitting: Ophthalmology

## 2018-10-11 LAB — GLUCOSE, CAPILLARY
Glucose-Capillary: 123 mg/dL — ABNORMAL HIGH (ref 70–99)
Glucose-Capillary: 196 mg/dL — ABNORMAL HIGH (ref 70–99)
Glucose-Capillary: 169 mg/dL — ABNORMAL HIGH (ref 70–99)
Glucose-Capillary: 282 mg/dL — ABNORMAL HIGH (ref 70–99)

## 2018-10-11 MED ORDER — QUETIAPINE FUMARATE 25 MG PO TABS: 25.0000 mg | ORAL_TABLET | Freq: Every day | ORAL | 0 refills | Status: DC

## 2018-10-11 MED ORDER — ENSURE ENLIVE PO LIQD: 237.0000 mL | Freq: Every day | ORAL | 12 refills | Status: DC

## 2018-10-11 MED ORDER — APIXABAN 5 MG PO TABS: 5.0000 mg | ORAL_TABLET | Freq: Two times a day (BID) | ORAL | Status: DC

## 2018-10-11 MED ORDER — RESOURCE THICKENUP CLEAR PO POWD: ORAL | Status: DC

## 2018-10-11 MED ORDER — INSULIN GLARGINE (1 UNIT DIAL) 300 UNIT/ML ~~LOC~~ SOPN: 10.0000 [IU] | PEN_INJECTOR | Freq: Every morning | SUBCUTANEOUS | Status: DC

## 2018-10-11 MED ORDER — ALBUTEROL SULFATE HFA 108 (90 BASE) MCG/ACT IN AERS: 2.0000 | INHALATION_SPRAY | RESPIRATORY_TRACT | Status: DC | PRN

## 2018-10-11 MED ORDER — ACETAMINOPHEN 160 MG/5ML PO SOLN: 650.0000 mg | ORAL | 0 refills | Status: DC | PRN

## 2018-10-11 MED ORDER — CARVEDILOL 6.25 MG PO TABS: 6.2500 mg | ORAL_TABLET | Freq: Two times a day (BID) | ORAL | Status: DC

## 2018-10-11 MED ORDER — POLYETHYLENE GLYCOL 3350 17 G PO PACK: 17.0000 g | PACK | Freq: Every day | ORAL | 0 refills | Status: DC

## 2018-10-11 NOTE — Progress Notes (Signed)
PROGRESS NOTE  Sabrina Baker EGB:[id number] DOB: 1940-10-31 DOA: 09/09/2018  PCP: [name], MD  Brief History/Interval Summary: 78 y.o.femalewith PMHx of HTN, DM, CKD stage III, presented with shortness of breath and cough, found to have acute hypoxic respiratory failure due to ARDS in the setting of COVID-19 infection.  3/30Admitted, intubated 3/21 R UE PICC 3/31>4/5 Plaquenil 4/13 transfer to [facility] 4/15 Extubated 4/15 Stridor-cord edema>reintubated 4/21 extubated  Reason for Visit: Pneumonia due to COVID-19  Consultants: She was seen by critical care medicine  Procedures:  3/30>> ETT 3/21>> RUE PICC 4/10>> A line-out 4/15>>extubated 4/15>>ETT (re-intubated) 4/20>> Extubated  Antibiotics: Anti-infectives (From admission, onward)   Start     Dose/Rate Route Frequency Ordered Stop   09/27/18 1300  cefTRIAXone (ROCEPHIN) 2 g in sodium chloride 0.9 % 100 mL IVPB     2 g 200 mL/hr over 30 Minutes Intravenous Every 24 hours 09/27/18 0928 10/03/18 1015   09/26/18 1400  ampicillin (OMNIPEN) 1 g in sodium chloride 0.9 % 100 mL IVPB     1 g 300 mL/hr over 20 Minutes Intravenous Every 8 hours 09/26/18 1204 10/03/18 2128   09/26/18 1300  cefTRIAXone (ROCEPHIN) 1 g in sodium chloride 0.9 % 100 mL IVPB  Status:  Discontinued     1 g 200 mL/hr over 30 Minutes Intravenous Every 24 hours 09/26/18 1204 09/27/18 0928   09/11/18 2200  hydroxychloroquine (PLAQUENIL) tablet 200 mg  Status:  Discontinued     200 mg Oral 2 times daily 09/10/18 2309 09/10/18 2319   09/11/18 2200  hydroxychloroquine (PLAQUENIL) tablet 200 mg     200 mg Per Tube 2 times daily 09/10/18 2319 09/15/18 0851   09/10/18 2330  hydroxychloroquine (PLAQUENIL) tablet 400 mg     400 mg Per Tube 2 times daily 09/10/18 2319 09/11/18 0823   09/10/18 2315  hydroxychloroquine (PLAQUENIL) tablet 400 mg  Status:  Discontinued     400 mg Oral 2 times daily 09/10/18 2309 09/10/18 2319   09/10/18 1300  azithromycin  (ZITHROMAX) 500 mg in sodium chloride 0.9 % 250 mL IVPB     500 mg 250 mL/hr over 60 Minutes Intravenous Every 24 hours 09/09/18 1342 09/15/18 1531   09/10/18 1200  cefTRIAXone (ROCEPHIN) 1 g in sodium chloride 0.9 % 100 mL IVPB  Status:  Discontinued     1 g 200 mL/hr over 30 Minutes Intravenous Every 24 hours 09/09/18 1342 09/14/18 1436   09/09/18 1200  cefTRIAXone (ROCEPHIN) 1 g in sodium chloride 0.9 % 100 mL IVPB     1 g 200 mL/hr over 30 Minutes Intravenous  Once 09/09/18 1148 09/09/18 1248   09/09/18 1200  azithromycin (ZITHROMAX) 500 mg in sodium chloride 0.9 % 250 mL IVPB     500 mg 250 mL/hr over 60 Minutes Intravenous  Once 09/09/18 1148 09/09/18 1351       Subjective/Interval History: Patient with no new complaints.  Breathing well.  She was taken off of oxygen this morning by nursing staff.    Assessment/Plan:  Acute Hypoxic Resp. Failure due to Acute Covid 19 Viral Illness during the ongoing 2020 Covid 19 Pandemic -She  was initially extubated on 4/15, then she developed stridor secondary to cord edema, requiring reintubation the same day, she was successfully extubated on 4/21. -Continue incentive spirometry.  PT and OT is working with the patient.  Prone position as tolerated.   -She was diuresed.  Lasix has been discontinued due to orthostatic hypotension. -She did receive Plaquenil  during earlier part of this hospitalization. -Respiratory status remained stable.  On 1 to 2 L of oxygen by nasal cannula.  Taken off of oxygen this morning to see how she does on room air.    Orthostatic hypotension in the setting of essential hypertension Blood pressure noted to drop when she goes from supine to standing position.  Patient's diuretic and amlodipine were discontinued.  Continue carvedilol for now.  TED stockings.  Most likely this is due to generalized deconditioning.  Would tolerate higher supine blood pressures.  UTI Urine culture growing both Citrobacter and  enterococcus.  She has completed a course of ceftriaxone.  Abnormal thyroid function tests TSH 0.551 with free T4 4.3 and free T3 0.8.  No known history of thyroid disease.  She did get levothyroxine briefly when she was in the ICU.  Subsequently discontinued.  Most likely sick euthyroid.  Will benefit from recheck in the next 4 to 6 weeks in the outpatient setting.  Fever Patient had fever 101.7 on 10/05/2018.  Septic work-up has been unremarkable.  No recurrence of fever.  Agitation/delirium Mentation has improved significantly.  Continue to monitor.  Continue Seroquel.  AKI on CKD stage III Renal function is at baseline.  Monitor urine output.    No need to check labs every day.  Paroxysmal atrial fibrillation Currently in normal sinus rhythm.  Continue carvedilol.  Continue Eliquis.    Macrocytic anemia No evidenceofovert bleeding-Ferritin 467-Folate 19.4-B12 2058.  Hemoglobin has been stable.    Diabetes mellitus type 2 with hyperglycemia A1c 7.3.    CBGs are reasonably well controlled.  Continue current SSI and Lantus dose.     Gout attack This involved the right thumb, and second left digit.  This has significantly improved.  She has completed 5 days of colchicine.  She also received 1 dose of IV Solu-Medrol.    Hypernatremia Resolved  Dysphagia Post extubation.  Speech therapy has been following.  Now on soft diet.  Code Status : Full code  Family Communication  :  Discussed with patient and her daughter on a daily basis.  Disposition Plan  :  Plan is for her to go to skilled nursing facility.    Barriers For Discharge :  Informed by social worker today that she will need 2 negative Covid 19 tests.  She had a negative test 2 days ago.  Another test has been ordered today.     Medications:  Scheduled: . apixaban  5 mg Oral BID  . carvedilol  6.25 mg Oral BID WC  . chlorhexidine  15 mL Mouth/Throat BID  . Chlorhexidine Gluconate Cloth  6 each  Topical Daily  . feeding supplement (ENSURE ENLIVE)  237 mL Oral Q2000  . gabapentin  100 mg Oral QHS  . guaiFENesin  1,200 mg Oral BID  . insulin aspart  0-15 Units Subcutaneous TID WC  . insulin glargine  10 Units Subcutaneous Daily  . pantoprazole  40 mg Oral Daily  . polyethylene glycol  17 g Oral Daily  . QUEtiapine  25 mg Oral QHS  . sodium chloride flush  10-40 mL Intracatheter Q12H   Continuous: . sodium chloride Stopped (10/08/18 0255)   IZT:IWPYKD chloride, acetaminophen (TYLENOL) oral liquid 160 mg/5 mL, albuterol, haloperidol lactate, hydrALAZINE, lip balm, Resource ThickenUp Clear, sodium chloride flush   Objective:  Vital Signs  Vitals:   10/11/18 0500 10/11/18 0517 10/11/18 0741 10/11/18 0800  BP:  (!) 155/52  (!) 165/89  Pulse:  91  94  Resp:  (!) 21  15  Temp:  98 F (36.7 C) 98.3 F (36.8 C) 98.4 F (36.9 C)  TempSrc:  Axillary  Oral  SpO2:  93%  96%  Weight: 67.4 kg     Height:        Intake/Output Summary (Last 24 hours) at 10/11/2018 1010 Last data filed at 10/11/2018 0519 Gross per 24 hour  Intake 0 ml  Output 750 ml  Net -750 ml   Filed Weights   10/11/18 0500  Weight: 67.4 kg    General appearance: Awake alert.  In no distress Resp: Normal effort.  Improved air entry bilaterally.  Few crackles at the bases.  No wheezing or rhonchi.  Cardio: S1-S2 is normal regular.  No S3-S4.  No rubs murmurs or bruit GI: Abdomen is soft.  Nontender nondistended.  Bowel sounds are present normal.  No masses organomegaly Extremities: No edema.  Full range of motion of lower extremities. Neurologic: Alert and oriented x3.  No focal neurological deficits.    Lab Results:  Data Reviewed: I have personally reviewed following labs and imaging studies  CBC: Recent Labs  Lab 10/05/18 0550 10/06/18 0430 10/07/18 0447 10/08/18 0255  WBC 16.6* 17.2* 14.5* 14.9*  HGB 7.8* 11.4* 8.2* 8.6*  HCT 26.0* 37.8 26.8* 28.1*  MCV 103.6* 99.0 101.1* 100.7*  PLT 332  344 347 709    Basic Metabolic Panel: Recent Labs  Lab 10/06/18 0430 10/07/18 0447 10/08/18 0255 10/09/18 0415 10/10/18 0400  NA 135 141 139 138 139  K 4.6 4.2 4.3 3.7 4.2  CL 101 106 101 100 104  CO2 26 29 29 30 28   GLUCOSE 305* 199* 113* 167* 169*  BUN 26* 23 21 18 19   CREATININE 1.26* 1.05* 1.01* 1.00 0.98  CALCIUM 7.6* 7.8* 7.8* 7.9* 8.3*  MG  --   --  1.4*  --  2.0    GFR: Estimated Creatinine Clearance: 44.3 mL/min (by C-G formula based on SCr of 0.98 mg/dL).  CBG: Recent Labs  Lab 10/10/18 0742 10/10/18 1156 10/10/18 1625 10/10/18 2115 10/11/18 0738  GLUCAP 143* 224* 237* 154* 123*     Recent Results (from the past 240 hour(s))  Culture, blood (Routine X 2) w Reflex to ID Panel     Status: None   Collection Time: 10/04/18  1:00 PM  Result Value Ref Range Status   Specimen Description BLOOD LEFT HAND  Final   Special Requests   Final    BOTTLES DRAWN AEROBIC AND ANAEROBIC Blood Culture adequate volume   Culture   Final    NO GROWTH 5 DAYS Performed at [facility], [place] [address], [place], [place] [address]    Report Status 10/09/2018 FINAL  Final  Novel Coronavirus, NAA (hospital order; send-out to ref lab)     Status: None   Collection Time: 10/09/18 10:15 AM  Result Value Ref Range Status   SARS-CoV-2, NAA NOT DETECTED NOT DETECTED Final    Comment: (NOTE) This test was developed and its performance characteristics determined by [employer]. This test has not been FDA cleared or approved. This test has been authorized by FDA under an Emergency Use Authorization (EUA). This test has been validated in accordance with the FDA's Guidance Document (Policy for [facility] in Laboratories Certified to Perform High Complexity Testing under CLIA prior to Emergency Use Authorization for Coronavirus [id number] during the [facility] Emergency) issued on February 29th, 2020. FDA independent review of this validation  is  pending. This test is only authorized for the duration of time the declaration that circumstances exist justifying the authorization of the emergency use of in vitro diagnostic tests for detection of SARS-CoV- 2 virus and/or diagnosis of COVID-19 infection under section 564(b)(1) of the Act, 21 U.S.C. 736KKD-5(T)(4), unless the authorization is terminated or revoked sooner. Performed At: [facility] [place], [place] [id number] Rush Farmer MD UP:[id number]    [place]  Final    Comment: Performed at [facility] [address], [place], [place] [address]      Radiology Studies: No results found.     LOS: 32 days   Olyvia Gopal [employer] on [email or web address]  10/11/2018, 10:10 AM

## 2018-10-11 NOTE — Progress Notes (Signed)
Called daughter and updated

## 2018-10-11 NOTE — Progress Notes (Signed)
Called [facility] Report given Sabrina Baker

## 2018-10-11 NOTE — Discharge Summary (Addendum)
[facility]  Physician Discharge Summary   Patient ID: Sabrina Baker MRN: [id number] DOB/AGE: 1940/11/16 78 y.o.  Admit date: 09/09/2018 Discharge date: 10/11/2018  PCP: [name], MD  DISCHARGE DIAGNOSES:  Acute respiratory failure with hypoxia Bilateral pneumonia is secondary to COVID-19 Static hypotension due to deconditioning Urinary tract infection status post treatment Abnormal thyroid function test requiring retesting in the next few weeks Acute metabolic encephalopathy, improved Chronic kidney disease stage III Paroxysmal atrial fibrillation Macrocytic anemia Diabetes mellitus type 2 History of gout Post extubation dysphagia  Patient referred to [facility] due to potential Covid 19 overflow.   RECOMMENDATIONS FOR OUTPATIENT FOLLOW UP: 1. Please recheck thyroid function test including TSH and free T4 in 3 to 4 weeks 2. CBC and basic metabolic panel in 1 week 3. PT OT and speech therapy to continue to work with patient   Home Health: NA. DC to SNF Equipment/Devices: NA   CODE STATUS: Full code  DISCHARGE CONDITION: fair  Diet recommendation: Soft diet with thin liquids  INITIAL HISTORY: 78 y.o.femalewith PMHx of HTN, DM, CKD stage III, presented with shortness of breath and cough, found to have acute hypoxic respiratory failure due to ARDS in the setting of COVID-19 infection.  3/30Admitted, intubated 3/21 R UE PICC 3/31>4/5 Plaquenil 4/13 transfer to [facility] 4/15 Extubated 4/15 Stridor-cord edema>reintubated 4/21 extubated  Consultants: She was seen by critical care medicine  Procedures:  3/30>> ETT 3/21>> RUE PICC 4/10>> A line-out 4/15>>extubated 4/15>>ETT (re-intubated) 4/20>> Extubated  Antibiotics Anti-infectives (From admission, onward)   Start     Dose/Rate Route Frequency Ordered Stop   09/27/18 1300  cefTRIAXone (ROCEPHIN) 2 g in sodium chloride 0.9 % 100 mL IVPB     2 g 200 mL/hr over 30 Minutes Intravenous Every 24  hours 09/27/18 0928 10/03/18 1015   09/26/18 1400  ampicillin (OMNIPEN) 1 g in sodium chloride 0.9 % 100 mL IVPB     1 g 300 mL/hr over 20 Minutes Intravenous Every 8 hours 09/26/18 1204 10/03/18 2128   09/26/18 1300  cefTRIAXone (ROCEPHIN) 1 g in sodium chloride 0.9 % 100 mL IVPB  Status:  Discontinued     1 g 200 mL/hr over 30 Minutes Intravenous Every 24 hours 09/26/18 1204 09/27/18 0928   09/11/18 2200  hydroxychloroquine (PLAQUENIL) tablet 200 mg  Status:  Discontinued     200 mg Oral 2 times daily 09/10/18 2309 09/10/18 2319   09/11/18 2200  hydroxychloroquine (PLAQUENIL) tablet 200 mg     200 mg Per Tube 2 times daily 09/10/18 2319 09/15/18 0851   09/10/18 2330  hydroxychloroquine (PLAQUENIL) tablet 400 mg     400 mg Per Tube 2 times daily 09/10/18 2319 09/11/18 0823   09/10/18 2315  hydroxychloroquine (PLAQUENIL) tablet 400 mg  Status:  Discontinued     400 mg Oral 2 times daily 09/10/18 2309 09/10/18 2319   09/10/18 1300  azithromycin (ZITHROMAX) 500 mg in sodium chloride 0.9 % 250 mL IVPB     500 mg 250 mL/hr over 60 Minutes Intravenous Every 24 hours 09/09/18 1342 09/15/18 1531   09/10/18 1200  cefTRIAXone (ROCEPHIN) 1 g in sodium chloride 0.9 % 100 mL IVPB  Status:  Discontinued     1 g 200 mL/hr over 30 Minutes Intravenous Every 24 hours 09/09/18 1342 09/14/18 1436   09/09/18 1200  cefTRIAXone (ROCEPHIN) 1 g in sodium chloride 0.9 % 100 mL IVPB     1 g 200 mL/hr over 30 Minutes Intravenous  Once 09/09/18  1148 09/09/18 1248   09/09/18 1200  azithromycin (ZITHROMAX) 500 mg in sodium chloride 0.9 % 250 mL IVPB     500 mg 250 mL/hr over 60 Minutes Intravenous  Once 09/09/18 1148 09/09/18 1351       HOSPITAL COURSE:   Acute Hypoxic Resp. Failure due to Acute Covid 19 Viral Illness during the ongoing 2020 Covid 19 Pandemic -She was initially extubated on 4/15, then she developed stridor secondary to cord edema, requiring reintubation the same day, she was successfully  extubated on 4/21 after she was treated with a course of steroids.  She was kept in prone position.  She started improving.  She was also diuresed. She did receive Plaquenilduring earlier part of this hospitalization.  Respiratory status is stable.  She is now down to 1 to 2 L of oxygen by nasal cannula.  Taken off of oxygen this morning to see how she does on room air.     Orthostatic hypotension in the setting of essential hypertension Blood pressure noted to drop when she goes from supine to standing position.  Patient's diuretic and amlodipine were discontinued.  Continue carvedilol for now.  TED stockings.  Most likely this is due to generalized deconditioning.  Would tolerate higher supine blood pressures.  This should hopefully improve as her physical conditioning improves.  UTI Urine culture growing both Citrobacter and enterococcus.  She has completed a course of ceftriaxone.  Abnormal thyroid function tests TSH 0.551 with free T4 4.3 and free T3 0.8.  No known history of thyroid disease.  She did get levothyroxine briefly when she was in the ICU.  Subsequently discontinued.  Most likely sick euthyroid.  Will benefit from recheck in the next few weeks in the outpatient setting.  Fever Patient had fever 101.7 on 10/05/2018.  Septic work-up has been unremarkable.  No recurrence of fever.  Acute Metabolic encephalopathy/delirium Mentation has improved significantly.  Continue Seroquel.  AKI on CKD stage III Renal function is at baseline.   Paroxysmal atrial fibrillation Currently in normal sinus rhythm.  Continue carvedilol.  Continue Eliquis.    Macrocytic anemia No evidenceofovert bleeding-Ferritin 467-Folate 19.4-B12 2058.  Hemoglobin has been stable.    Diabetes mellitus type 2 with hyperglycemia A1c 7.3.  CBGs are reasonably well controlled.  Continue current SSI and Lantus dose.     Gout attack This involved the right thumb, and second left digit.  This  has significantly improved.  She has completed 5 days of colchicine.  She also received 1 dose of IV Solu-Medrol.    Hypernatremia Resolved  Dysphagia Post extubation. Speech therapy has been following.  Now on soft diet.  Overall stable.  Accepted at skilled nursing facility today.  She had 1- COVID-19 test.  Another was repeated this morning and results are pending.  However [facility] skilled nursing is okay with her coming there with one negative result.  Okay for discharge.  PICC line will be removed prior to discharge.   PERTINENT LABS:  The results of significant diagnostics from this hospitalization (including imaging, microbiology, ancillary and laboratory) are listed below for reference.    Microbiology: Recent Results (from the past 240 hour(s))  Culture, blood (Routine X 2) w Reflex to ID Panel     Status: None   Collection Time: 10/04/18  1:00 PM  Result Value Ref Range Status   Specimen Description BLOOD LEFT HAND  Final   Special Requests   Final    BOTTLES DRAWN AEROBIC AND ANAEROBIC Blood Culture  adequate volume   Culture   Final    NO GROWTH 5 DAYS Performed at [facility], [place] [address], [place], [place] [address]    Report Status 10/09/2018 FINAL  Final  Novel Coronavirus, NAA (hospital order; send-out to ref lab)     Status: None   Collection Time: 10/09/18 10:15 AM  Result Value Ref Range Status   SARS-CoV-2, NAA NOT DETECTED NOT DETECTED Final    Comment: (NOTE) This test was developed and its performance characteristics determined by [employer]. This test has not been FDA cleared or approved. This test has been authorized by FDA under an Emergency Use Authorization (EUA). This test has been validated in accordance with the FDA's Guidance Document (Policy for [place] in Laboratories Certified to Perform High Complexity Testing under CLIA prior to Emergency Use Authorization for Coronavirus [id number] during the [facility] Emergency) issued on February 29th, 2020. FDA independent review of this validation is pending. This test is only authorized for the duration of time the declaration that circumstances exist justifying the authorization of the emergency use of in vitro diagnostic tests for detection of SARS-CoV- 2 virus and/or diagnosis of COVID-19 infection under section 564(b)(1) of the Act, 21 U.S.C. 878MVE-7(M)(0), unless the authorization is terminated or revoked sooner. Performed At: [facility] [place], [place] [id number] Rush Farmer MD MO:[id number]    [place]  Final    Comment: Performed at [place] [address], [place], [place] [address]     Labs: Basic Metabolic Panel: Recent Labs  Lab 10/06/18 0430 10/07/18 0447 10/08/18 0255 10/09/18 0415 10/10/18 0400  NA 135 141 139 138 139  K 4.6 4.2 4.3 3.7 4.2  CL 101 106 101 100 104  CO2 26 29 29 30 28   GLUCOSE 305* 199* 113* 167* 169*  BUN 26* 23 21 18 19   CREATININE 1.26* 1.05* 1.01* 1.00 0.98  CALCIUM 7.6* 7.8* 7.8* 7.9* 8.3*  MG  --   --  1.4*  --  2.0   CBC: Recent Labs  Lab 10/05/18 0550 10/06/18 0430 10/07/18 0447 10/08/18 0255  WBC 16.6* 17.2* 14.5* 14.9*  HGB 7.8* 11.4* 8.2* 8.6*  HCT 26.0* 37.8 26.8* 28.1*  MCV 103.6* 99.0 101.1* 100.7*  PLT 332 344 347 346   BNP: BNP (last 3 results) Recent Labs    09/09/18 1002 09/22/18 1600 09/25/18 0350  BNP 295.2* 90.7 278.8*    CBG: Recent Labs  Lab 10/10/18 1156 10/10/18 1625 10/10/18 2115 10/11/18 0738 10/11/18 1140  GLUCAP 224* 237* 154* 123* 196*     IMAGING STUDIES Dg Chest Port 1 View  Result Date: 10/04/2018 CLINICAL DATA:  Fever. EXAM: PORTABLE CHEST 1 VIEW COMPARISON:  Chest x-ray dated September 29, 2018. FINDINGS: Interval removal of the endotracheal and enteric tubes. Unchanged right upper extremity PICC line. Stable cardiomediastinal silhouette. Persistent low  lung volumes. Patchy bilateral airspace disease appears improved at the lung bases, but slightly worsened in the right upper lobe. No pneumothorax or large pleural effusion. No acute osseous abnormality. IMPRESSION: 1. Patchy bilateral airspace disease appears improved at the lung bases, but slightly worsened in the right upper lobe. Electronically Signed   By: Titus Dubin M.D.   On: 10/04/2018 12:05   Dg Chest Port 1 View  Result Date: 09/29/2018 CLINICAL DATA:  Acute hypoxia due to COVID 19 Fever 101.7 yesterday EXAM: PORTABLE CHEST 1 VIEW COMPARISON:  09/26/2018 FINDINGS: Endotracheal tube, NG tube,  PICC line unchanged. Stable cardiac silhouette. Low lung volumes. Some improvement in pulmonary parenchymal opacities. IMPRESSION: 1. Mild improvement pulmonary parenchymal opacities. 2. Persistent low lung volumes. 3. Stable support apparatus. Electronically Signed   By: Suzy Bouchard M.D.   On: 09/29/2018 06:30   Dg Chest Port 1 View  Result Date: 09/26/2018 CLINICAL DATA:  Shortness of breath. EXAM: PORTABLE CHEST 1 VIEW COMPARISON:  One-view chest x-ray 09/25/2018 FINDINGS: The heart size is exaggerate by low lung volumes. The endotracheal tube has been pulled back slightly, now terminating 2.5 cm above the carina. A right-sided PICC line is stable. The NG tube courses off the inferior border of the film. Bilateral patchy interstitial and airspace disease is again noted. There is less right middle lobe consolidation. No definite effusions are present. IMPRESSION: 1. Improved aeration of the right middle lobe. 2. Persistent patchy interstitial and airspace disease bilaterally is worrisome infection or edema. 3. Repositioning of endotracheal tube, now 2.5 cm above the carina. Electronically Signed   By: San Morelle M.D.   On: 09/26/2018 07:52   Dg Chest Port 1 View  Result Date: 09/25/2018 CLINICAL DATA:  78 year old female with a history of intubation EXAM: PORTABLE CHEST 1 VIEW  COMPARISON:  09/24/2018 FINDINGS: Endotracheal tube now terminates at the carina. Tube may be withdrawn 6 cm-7 cm for better positioning. Right upper extremity PICC. Gastric tube terminates out of the field of view. Low lung volumes with similar appearance of reticulonodular opacity throughout with increasing airspace opacity in the right mid lung. Persistently low lung volumes.  No pneumothorax or pleural effusion. IMPRESSION: Endotracheal tube terminates at the carina and may be withdrawn 6 cm-7 cm for better positioning. Low lung volumes persist with mixed interstitial and airspace opacities bilaterally, with increasing airspace opacity in the right mid lung. Unchanged right upper extremity PICC and gastric tube. Electronically Signed   By: Corrie Mckusick D.O.   On: 09/25/2018 19:11   Dg Chest Port 1 View  Result Date: 09/24/2018 CLINICAL DATA:  Short of breath EXAM: PORTABLE CHEST 1 VIEW COMPARISON:  09/23/2018 FINDINGS: Normal heart size. Endotracheal tube, NG tube, and right PICC are stable. Patchy airspace opacities throughout both lungs are not significantly changed. No pneumothorax. IMPRESSION: Stable patchy airspace disease throughout both lungs. Electronically Signed   By: Marybelle Killings M.D.   On: 09/24/2018 07:41   Dg Chest Port 1 View  Result Date: 09/23/2018 CLINICAL DATA:  Respiratory failure. EXAM: PORTABLE CHEST 1 VIEW COMPARISON:  One-view chest x-ray 09/21/2018 FINDINGS: The heart size is normal. Endotracheal tube is stable. NG tube courses off the inferior border the film. Right-sided PICC line is stable and in satisfactory position. Bilateral interstitial and airspace disease is similar the prior exam. There is improved aeration at the left base. Increased airspace opacities are noted in the right middle and upper lobes. IMPRESSION: 1. Bilateral interstitial and airspace disease concerning for infection. There is slight increase on the right and some improvement on the left. 2. Support  apparatus is stable. Electronically Signed   By: San Morelle M.D.   On: 09/23/2018 07:41   Dg Chest Port 1 View  Result Date: 09/21/2018 CLINICAL DATA:  Respiratory failure. EXAM: PORTABLE CHEST 1 VIEW COMPARISON:  Radiograph of September 21, 2018. FINDINGS: Stable cardiomediastinal silhouette. Endotracheal and nasogastric tubes are in grossly good position. Right-sided PICC line is unchanged. No pneumothorax or significant pleural effusion is noted. Stable bilateral lung opacities are noted concerning for pneumonia or possibly edema. Bony thorax  is unremarkable. IMPRESSION: Stable support apparatus. Stable bilateral lung opacities as described above. Electronically Signed   By: Marijo Conception, M.D.   On: 09/21/2018 22:03   Dg Chest Port 1 View  Result Date: 09/21/2018 CLINICAL DATA:  Respiratory failure EXAM: PORTABLE CHEST 1 VIEW COMPARISON:  09/20/2018 FINDINGS: Endotracheal tube in good position. Right arm PICC tip in the proximal SVC unchanged. NG tube in the stomach. Mild improvement in bilateral airspace disease. Small left effusion. IMPRESSION: Endotracheal tube in good position Diffuse bilateral airspace disease with mild improvement. Electronically Signed   By: Franchot Gallo M.D.   On: 09/21/2018 08:10   Dg Chest Port 1 View  Result Date: 09/20/2018 CLINICAL DATA:  Respiratory failure.  Ventilator support. EXAM: PORTABLE CHEST 1 VIEW COMPARISON:  09/18/2018 FINDINGS: Endotracheal tube tip is 4 cm above the carina. Orogastric tube enters the abdomen. Bilateral patchy pulmonary infiltrates appear quite similar, with slightly more volume loss at the right lung base. No new finding otherwise. IMPRESSION: Persistent patchy bilateral pulmonary infiltrates. Slight worsening of volume loss at the right lung base. Electronically Signed   By: Nelson Chimes M.D.   On: 09/20/2018 06:11   Dg Chest Port 1 View  Result Date: 09/18/2018 CLINICAL DATA:  ARDS, COVID+ Pt intubated on airborne and contact  PPE: Gloves, gown, N95, goggles, bouffant. EXAM: PORTABLE CHEST - 1 VIEW COMPARISON:  09/16/2018 FINDINGS: Endotracheal tube, nasogastric tube, right arm PICC stable. Slight improvement in right lower lung airspace disease with patchy hazy infiltrates in the left mid and lower lung and right upper lung as before. Heart size and mediastinal contours are within normal limits. No effusion. No pneumothorax. Fixation hardware in the proximal right humerus. IMPRESSION: 1. Patchy bilateral airspace disease, with slight improvement in right lower lung opacities since prior study. 2. Support hardware stable in position. Electronically Signed   By: Lucrezia Europe M.D.   On: 09/18/2018 08:05   Dg Chest Port 1 View  Result Date: 09/16/2018 CLINICAL DATA:  Respiratory failure EXAM: PORTABLE CHEST 1 VIEW COMPARISON:  Yesterday FINDINGS: Endotracheal tube tip between the clavicular heads and carina. The orogastric tube at least reaches the stomach. Right-sided central line with tip at the SVC. No appreciable change in patchy bilateral interstitial and airspace opacity. Stable heart size. No evidence of air leak or pleural fluid. IMPRESSION: 1. Unchanged multifocal pneumonia. 2. Stable hardware positioning. Electronically Signed   By: Monte Fantasia M.D.   On: 09/16/2018 07:09   Dg Chest Port 1 View  Result Date: 09/15/2018 CLINICAL DATA:  Respiratory failure. Intubated patient. Follow-up exam. EXAM: PORTABLE CHEST 1 VIEW COMPARISON:  Prior studies, most recent 09/14/2018 FINDINGS: Cardiac silhouette is normal size.  No mediastinal or hilar masses. There are hazy bilateral airspace lung opacities which are without change from the previous day's exam. No new lung abnormalities. No convincing pleural effusion and no pneumothorax. Endotracheal tube, nasal/orogastric tube and right PICC are stable. IMPRESSION: 1. No change from previous day's study. 2. Persistent bilateral ground-glass lung opacities. 3. Stable well-positioned  support apparatus. Electronically Signed   By: Lajean Manes M.D.   On: 09/15/2018 06:47   Dg Chest Port 1 View  Result Date: 09/14/2018 CLINICAL DATA:  Respiratory failure.  COVID-19. EXAM: PORTABLE CHEST 1 VIEW COMPARISON:  09/13/2018 and prior studies dating back to 09/09/2018. FINDINGS: Bilateral ground-glass lung opacities are without significant change from the prior exams. Atelectasis at the left lung base is stable. More confluent opacity at the right lung base  is less prominent, which appears to be due to larger lung volumes on the current exam. No new lung abnormalities.  No pleural effusion or pneumothorax. Endotracheal tube, nasal/orogastric tube and right PICC are stable in well positioned. IMPRESSION: 1. No significant change from the previous day's study. 2. Persistent bilateral ground-glass lung opacities more confluent basilar opacities. 3. Stable well-positioned support apparatus. Electronically Signed   By: Lajean Manes M.D.   On: 09/14/2018 06:24   Dg Chest Port 1 View  Result Date: 09/13/2018 CLINICAL DATA:  Respiratory failure EXAM: PORTABLE CHEST 1 VIEW COMPARISON:  Yesterday FINDINGS: Endotracheal tube tip between the clavicular heads and carina. The orogastric tube has been advanced and now at least reaches the stomach-as confirmed on interval KUB. Right PICC with tip at the SVC. Patchy bilateral airspace disease. Borderline heart size. No effusion or pneumothorax. IMPRESSION: Stable hardware positioning and bilateral pneumonia. Electronically Signed   By: Monte Fantasia M.D.   On: 09/13/2018 06:08   Dg Chest Port 1 View  Result Date: 09/12/2018 CLINICAL DATA:  History of endotracheal tube EXAM: PORTABLE CHEST 1 VIEW COMPARISON:  Yesterday FINDINGS: Endotracheal tube tip between the clavicular heads and carina. The orogastric tube tip is in the region of the GE junction. Right PICC with tip near the SVC origin. Patchy bilateral lung opacity with both interstitial and airspace  appearance. No effusion or pneumothorax. Normal heart size. These results will be called to the ordering clinician or representative by the Radiologist Assistant, and communication documented in the PACS or zVision Dashboard. IMPRESSION: 1. Shortened orogastric tube with tip now at the GE junction. 2. Other hardware positioning is stable. 3. Bilateral lung opacity with progression. Electronically Signed   By: Monte Fantasia M.D.   On: 09/12/2018 07:14   Dg Abd Portable 1v  Result Date: 09/25/2018 CLINICAL DATA:  78 y/o  F; intubation, NG placement. EXAM: PORTABLE ABDOMEN - 1 VIEW COMPARISON:  09/12/2018 abdomen radiographs. FINDINGS: Enteric tube tip projects over the gastric body. Normal bowel gas pattern. Ill-defined patchy opacities in lung bases. No acute osseous abnormality is evident. IMPRESSION: Enteric tube tip projects over the gastric body. Normal bowel gas pattern. Electronically Signed   By: Kristine Garbe M.D.   On: 09/25/2018 19:19   Dg Abd Portable 1v  Result Date: 09/12/2018 CLINICAL DATA:  Orogastric tube placement. EXAM: PORTABLE ABDOMEN - 1 VIEW COMPARISON:  CT abdomen and pelvis June 24, 2018 FINDINGS: Nasogastric tube tip projects in gastric antrum. No intra-abdominal mass effect or pathologic calcifications. Included bowel gas pattern is nondilated and nonobstructive. Mild vascular calcifications. Interstitial and alveolar airspace opacities included lung bases better characterized on today's dedicated radiograph. Thoracolumbar levoscoliosis. IMPRESSION: 1. Nasogastric tube tip projects in gastric antrum. Electronically Signed   By: Elon Alas M.D.   On: 09/12/2018 22:13    DISCHARGE EXAMINATION: See progress note from earlier today for examination findings  DISPOSITION: Skilled nursing facility  Discharge Instructions    Call MD for:  difficulty breathing, headache or visual disturbances   Complete by:  As directed    Call MD for:  extreme fatigue    Complete by:  As directed    Call MD for:  persistant dizziness or light-headedness   Complete by:  As directed    Call MD for:  persistant nausea and vomiting   Complete by:  As directed    Call MD for:  severe uncontrolled pain   Complete by:  As directed    Call MD for:  temperature >100.4   Complete by:  As directed    Discharge instructions   Complete by:  As directed    Please review instructions on the discharge summary.  You were cared for by a hospitalist during your hospital stay. If you have any questions about your discharge medications or the care you received while you were in the hospital after you are discharged, you can call the unit and asked to speak with the hospitalist on call if the hospitalist that took care of you is not available. Once you are discharged, your primary care physician will handle any further medical issues. Please note that NO REFILLS for any discharge medications will be authorized once you are discharged, as it is imperative that you return to your primary care physician (or establish a relationship with a primary care physician if you do not have one) for your aftercare needs so that they can reassess your need for medications and monitor your lab values. If you do not have a primary care physician, you can call [phone] for a physician referral.   Increase activity slowly   Complete by:  As directed         Allergies as of 10/11/2018      Reactions   Altace [ramipril] Anaphylaxis   Diovan [valsartan] Other (See Comments)   Hyperkalemia   Niacin Diarrhea   And nausea   Niacin And Related Diarrhea      Medication List    STOP taking these medications   amLODipine 5 MG tablet Commonly known as:  NORVASC   mupirocin ointment 2 % Commonly known as:  BACTROBAN   pioglitazone 30 MG tablet Commonly known as:  ACTOS   Trulicity 1.5 PJ/8.2NK Sopn Generic drug:  Dulaglutide     TAKE these medications   acetaminophen 160 MG/5ML  solution Commonly known as:  TYLENOL Take 20.3 mLs (650 mg total) by mouth every 4 (four) hours as needed for fever.   albuterol 108 (90 Base) MCG/ACT inhaler Commonly known as:  VENTOLIN HFA Inhale 2 puffs into the lungs every 4 (four) hours as needed for wheezing.   apixaban 5 MG Tabs tablet Commonly known as:  ELIQUIS Take 1 tablet (5 mg total) by mouth 2 (two) times daily.   atorvastatin 10 MG tablet Commonly known as:  LIPITOR Take 10 mg by mouth daily.   calcium-vitamin D 500-200 MG-UNIT tablet Commonly known as:  OSCAL WITH D Take 1 tablet by mouth 2 (two) times daily.   carvedilol 6.25 MG tablet Commonly known as:  COREG Take 1 tablet (6.25 mg total) by mouth 2 (two) times daily with a meal. What changed:    medication strength  how much to take   cholecalciferol 1000 units tablet Commonly known as:  VITAMIN D Take 1,000 Units by mouth daily.   colchicine 0.6 MG tablet Take 0.6 mg by mouth daily as needed (gout). Started 04/07/13   DULoxetine 60 MG capsule Commonly known as:  CYMBALTA Take by mouth daily.   feeding supplement (ENSURE ENLIVE) Liqd Take 237 mLs by mouth daily at 8 pm.   Fifty50 Pen Needles 32G X 4 MM Misc Generic drug:  Insulin Pen Needle USE TO INJECT INSULIN ONCE A DAY   gabapentin 100 MG capsule Commonly known as:  NEURONTIN Take 100 mg by mouth at bedtime.   Insulin Glargine (1 Unit Dial) 300 UNIT/ML Sopn Commonly known as:  Toujeo SoloStar Inject 10 Units into the skin every morning. What changed:  medication strength  how much to take   Iron 325 (65 Fe) MG Tabs Take 1 tablet by mouth 3 (three) times daily.   Lancets 30G Misc Use to test blood sugars 3 times a day (Dx: E11.65)   latanoprost 0.005 % ophthalmic solution Commonly known as:  XALATAN Place 1 drop into the left eye at bedtime.   pantoprazole 40 MG tablet Commonly known as:  PROTONIX Take 40 mg by mouth 2 (two) times daily.   polyethylene glycol 17 g  packet Commonly known as:  MIRALAX / GLYCOLAX Take 17 g by mouth daily. Start taking on:  Oct 12, 2018   QUEtiapine 25 MG tablet Commonly known as:  SEROQUEL Take 1 tablet (25 mg total) by mouth at bedtime.   Resource ThickenUp Clear Powd Use as directed for liquid consumption   triamcinolone cream 0.1 % Commonly known as:  KENALOG Apply 1 application topically 2 (two) times daily.   vitamin B-12 1000 MCG tablet Commonly known as:  CYANOCOBALAMIN Take 1 tablet once a day          TOTAL DISCHARGE TIME: 35 minutes  Jermall Isaacson [employer] on [email or web address]  10/11/2018, 2:27 PM

## 2018-10-11 NOTE — Progress Notes (Signed)
CSW consulted with [facility] which is family's preference for SNF placement. CSW updated them on 1 negative COVID test, pending the 2nd. They report they will review patient's chart again to see if they can make a bed offer once she tests negative for a second time. CSW reiterated patient medically ready to dc once this occurs.   [place], [place]

## 2018-10-11 NOTE — Progress Notes (Signed)
CSW consulted with family regarding plan of discharging from [facility] to [facility]. They are in agreement with this plan and report being happy that patient is able to leave [facility] healthy and transition to rehab facility for continued physical therapy.   CSW has notified contact Lenord Fellers at [facility] at [phone] of family's agreement. Raquel Sarna will contact family if patient is transferred to them today, currently pending insurance authorization.   [place], [place]

## 2018-10-11 NOTE — TOC Transition Note (Addendum)
Transition of Care [facility]) - CM/SW Discharge Note   Patient Details  Name: CELISE BAZAR MRN: [id number] Date of Birth: 1940-08-22  Transition of Care [facility]) CM/SW Contact:  Alberteen Sam, LCSW Phone Number: 10/11/2018, 4:29 PM   Clinical Narrative:     Patient will DC to: [facility] Anticipated DC date: 10/11/2018 Family notified:Dennis and [name] (children) Transport by: Corey Harold  Per MD patient ready for DC to [facility] . RN, patient, patient's family, and facility notified of DC. Discharge Summary sent . RN given number for report [phone] Room 209 DC packet on chart. Ambulance transport requested for patient for 8:00 pm per facility request. CSW signing off.  [place], [place]   Final next level of care: Skilled Nursing Facility Barriers to Discharge: No Barriers Identified   Patient Goals and CMS Choice   CMS Medicare.gov Compare Post Acute Care list provided to:: Patient Represenative (must comment)(Dennis (son) and Arrie Aran (daughter)) Choice offered to / list presented to : Adult Children  Discharge Placement PASRR number recieved: 11/07/18            Patient chooses bed at: [facility] Patient to be transferred to facility by: [facility] Name of family member notified: Simona Huh and [facility] Patient and family notified of of transfer: 10/11/18  Discharge Plan and Services   Discharge Planning Services: NA Post Acute Care Choice: [facility]          DME Arranged: N/A DME Agency: NA       HH Arranged: NA HH Agency: NA        Social Determinants of Health (SDOH) Interventions     Readmission Risk Interventions No flowsheet data found.

## 2018-10-12 LAB — NOVEL CORONAVIRUS, NAA (HOSP ORDER, SEND-OUT TO REF LAB; TAT 18-24 HRS): SARS-CoV-2, NAA: NOT DETECTED

## 2018-10-14 ENCOUNTER — Other Ambulatory Visit: Payer: Medicare Other | Admitting: Orthotics

## 2018-10-14 DIAGNOSIS — J9621 Acute and chronic respiratory failure with hypoxia: Secondary | ICD-10-CM

## 2018-10-14 DIAGNOSIS — I480 Paroxysmal atrial fibrillation: Secondary | ICD-10-CM

## 2018-10-14 DIAGNOSIS — J181 Lobar pneumonia, unspecified organism: Secondary | ICD-10-CM

## 2018-10-14 DIAGNOSIS — U071 COVID-19: Secondary | ICD-10-CM

## 2018-10-14 DIAGNOSIS — N183 Chronic kidney disease, stage 3 (moderate): Secondary | ICD-10-CM

## 2018-10-15 DIAGNOSIS — N183 Chronic kidney disease, stage 3 (moderate): Secondary | ICD-10-CM

## 2018-10-15 DIAGNOSIS — J9621 Acute and chronic respiratory failure with hypoxia: Secondary | ICD-10-CM

## 2018-10-15 DIAGNOSIS — J181 Lobar pneumonia, unspecified organism: Secondary | ICD-10-CM

## 2018-10-15 DIAGNOSIS — I480 Paroxysmal atrial fibrillation: Secondary | ICD-10-CM

## 2018-10-15 DIAGNOSIS — U071 COVID-19: Secondary | ICD-10-CM

## 2018-10-16 DIAGNOSIS — J181 Lobar pneumonia, unspecified organism: Secondary | ICD-10-CM

## 2018-10-16 DIAGNOSIS — N183 Chronic kidney disease, stage 3 (moderate): Secondary | ICD-10-CM

## 2018-10-16 DIAGNOSIS — J9621 Acute and chronic respiratory failure with hypoxia: Secondary | ICD-10-CM

## 2018-10-16 DIAGNOSIS — I480 Paroxysmal atrial fibrillation: Secondary | ICD-10-CM

## 2018-10-16 DIAGNOSIS — U071 COVID-19: Secondary | ICD-10-CM

## 2018-10-17 DIAGNOSIS — N183 Chronic kidney disease, stage 3 (moderate): Secondary | ICD-10-CM

## 2018-10-17 DIAGNOSIS — I480 Paroxysmal atrial fibrillation: Secondary | ICD-10-CM

## 2018-10-17 DIAGNOSIS — U071 COVID-19: Secondary | ICD-10-CM

## 2018-10-17 DIAGNOSIS — J9621 Acute and chronic respiratory failure with hypoxia: Secondary | ICD-10-CM

## 2018-10-17 DIAGNOSIS — J181 Lobar pneumonia, unspecified organism: Secondary | ICD-10-CM

## 2018-10-18 DIAGNOSIS — N183 Chronic kidney disease, stage 3 (moderate): Secondary | ICD-10-CM

## 2018-10-18 DIAGNOSIS — U071 COVID-19: Secondary | ICD-10-CM

## 2018-10-18 DIAGNOSIS — J181 Lobar pneumonia, unspecified organism: Secondary | ICD-10-CM

## 2018-10-18 DIAGNOSIS — I480 Paroxysmal atrial fibrillation: Secondary | ICD-10-CM

## 2018-10-18 DIAGNOSIS — J9621 Acute and chronic respiratory failure with hypoxia: Secondary | ICD-10-CM

## 2018-10-19 DIAGNOSIS — N183 Chronic kidney disease, stage 3 (moderate): Secondary | ICD-10-CM

## 2018-10-19 DIAGNOSIS — I480 Paroxysmal atrial fibrillation: Secondary | ICD-10-CM

## 2018-10-19 DIAGNOSIS — U071 COVID-19: Secondary | ICD-10-CM

## 2018-10-19 DIAGNOSIS — J181 Lobar pneumonia, unspecified organism: Secondary | ICD-10-CM

## 2018-10-19 DIAGNOSIS — J9621 Acute and chronic respiratory failure with hypoxia: Secondary | ICD-10-CM

## 2018-10-20 DIAGNOSIS — N183 Chronic kidney disease, stage 3 (moderate): Secondary | ICD-10-CM

## 2018-10-20 DIAGNOSIS — J9621 Acute and chronic respiratory failure with hypoxia: Secondary | ICD-10-CM

## 2018-10-20 DIAGNOSIS — U071 COVID-19: Secondary | ICD-10-CM

## 2018-10-20 DIAGNOSIS — I480 Paroxysmal atrial fibrillation: Secondary | ICD-10-CM

## 2018-10-20 DIAGNOSIS — J181 Lobar pneumonia, unspecified organism: Secondary | ICD-10-CM

## 2018-10-28 DIAGNOSIS — N183 Chronic kidney disease, stage 3 (moderate): Secondary | ICD-10-CM

## 2018-10-28 DIAGNOSIS — J181 Lobar pneumonia, unspecified organism: Secondary | ICD-10-CM

## 2018-10-28 DIAGNOSIS — U071 COVID-19: Secondary | ICD-10-CM

## 2018-10-28 DIAGNOSIS — J9621 Acute and chronic respiratory failure with hypoxia: Secondary | ICD-10-CM

## 2018-10-28 DIAGNOSIS — I480 Paroxysmal atrial fibrillation: Secondary | ICD-10-CM

## 2018-10-29 DIAGNOSIS — I480 Paroxysmal atrial fibrillation: Secondary | ICD-10-CM

## 2018-10-29 DIAGNOSIS — J181 Lobar pneumonia, unspecified organism: Secondary | ICD-10-CM

## 2018-10-29 DIAGNOSIS — N183 Chronic kidney disease, stage 3 (moderate): Secondary | ICD-10-CM

## 2018-10-29 DIAGNOSIS — U071 COVID-19: Secondary | ICD-10-CM

## 2018-10-29 DIAGNOSIS — J9621 Acute and chronic respiratory failure with hypoxia: Secondary | ICD-10-CM

## 2018-11-06 ENCOUNTER — Telehealth: Payer: Self-pay | Admitting: *Deleted

## 2018-11-06 NOTE — Telephone Encounter (Signed)
I called pt however there was not an answer and no voicemail picked up so no message was able to be left.

## 2018-11-07 ENCOUNTER — Ambulatory Visit: Payer: Medicare Other | Admitting: Podiatry

## 2018-11-13 ENCOUNTER — Encounter: Payer: Self-pay | Admitting: Podiatry

## 2018-11-22 ENCOUNTER — Ambulatory Visit: Payer: Medicare Other | Admitting: Podiatry

## 2018-11-26 ENCOUNTER — Other Ambulatory Visit: Payer: Self-pay | Admitting: Family Medicine

## 2018-11-26 DIAGNOSIS — Z1231 Encounter for screening mammogram for malignant neoplasm of breast: Secondary | ICD-10-CM

## 2018-12-20 ENCOUNTER — Encounter: Payer: Self-pay | Admitting: Cardiovascular Disease

## 2018-12-20 ENCOUNTER — Telehealth: Payer: Self-pay | Admitting: Cardiovascular Disease

## 2018-12-20 ENCOUNTER — Ambulatory Visit (INDEPENDENT_AMBULATORY_CARE_PROVIDER_SITE_OTHER): Payer: Medicare Other | Admitting: Podiatry

## 2018-12-20 ENCOUNTER — Other Ambulatory Visit: Payer: Self-pay

## 2018-12-20 DIAGNOSIS — E11610 Type 2 diabetes mellitus with diabetic neuropathic arthropathy: Secondary | ICD-10-CM

## 2018-12-20 DIAGNOSIS — M79675 Pain in left toe(s): Secondary | ICD-10-CM

## 2018-12-20 DIAGNOSIS — M79674 Pain in right toe(s): Secondary | ICD-10-CM

## 2018-12-20 DIAGNOSIS — E119 Type 2 diabetes mellitus without complications: Secondary | ICD-10-CM | POA: Diagnosis not present

## 2018-12-20 DIAGNOSIS — E1142 Type 2 diabetes mellitus with diabetic polyneuropathy: Secondary | ICD-10-CM | POA: Diagnosis not present

## 2018-12-20 DIAGNOSIS — B351 Tinea unguium: Secondary | ICD-10-CM

## 2018-12-20 NOTE — Patient Instructions (Signed)
Diabetes Mellitus and Foot Care Foot care is an important part of your health, especially when you have diabetes. Diabetes may cause you to have problems because of poor blood flow (circulation) to your feet and legs, which can cause your skin to:  Become thinner and drier.  Break more easily.  Heal more slowly.  Peel and crack. You may also have nerve damage (neuropathy) in your legs and feet, causing decreased feeling in them. This means that you may not notice minor injuries to your feet that could lead to more serious problems. Noticing and addressing any potential problems early is the best way to prevent future foot problems. How to care for your feet Foot hygiene  Wash your feet daily with warm water and mild soap. Do not use hot water. Then, pat your feet and the areas between your toes until they are completely dry. Do not soak your feet as this can dry your skin.  Trim your toenails straight across. Do not dig under them or around the cuticle. File the edges of your nails with an emery board or nail file.  Apply a moisturizing lotion or petroleum jelly to the skin on your feet and to dry, brittle toenails. Use lotion that does not contain alcohol and is unscented. Do not apply lotion between your toes. Shoes and socks  Wear clean socks or stockings every day. Make sure they are not too tight. Do not wear knee-high stockings since they may decrease blood flow to your legs.  Wear shoes that fit properly and have enough cushioning. Always look in your shoes before you put them on to be sure there are no objects inside.  To break in new shoes, wear them for just a few hours a day. This prevents injuries on your feet. Wounds, scrapes, corns, and calluses  Check your feet daily for blisters, cuts, bruises, sores, and redness. If you cannot see the bottom of your feet, use a mirror or ask someone for help.  Do not cut corns or calluses or try to remove them with medicine.  If you  find a minor scrape, cut, or break in the skin on your feet, keep it and the skin around it clean and dry. You may clean these areas with mild soap and water. Do not clean the area with peroxide, alcohol, or iodine.  If you have a wound, scrape, corn, or callus on your foot, look at it several times a day to make sure it is healing and not infected. Check for: ? Redness, swelling, or pain. ? Fluid or blood. ? Warmth. ? Pus or a bad smell. General instructions  Do not cross your legs. This may decrease blood flow to your feet.  Do not use heating pads or hot water bottles on your feet. They may burn your skin. If you have lost feeling in your feet or legs, you may not know this is happening until it is too late.  Protect your feet from hot and cold by wearing shoes, such as at the beach or on hot pavement.  Schedule a complete foot exam at least once a year (annually) or more often if you have foot problems. If you have foot problems, report any cuts, sores, or bruises to your health care provider immediately. Contact a health care provider if:  You have a medical condition that increases your risk of infection and you have any cuts, sores, or bruises on your feet.  You have an injury that is not   healing.  You have redness on your legs or feet.  You feel burning or tingling in your legs or feet.  You have pain or cramps in your legs and feet.  Your legs or feet are numb.  Your feet always feel cold.  You have pain around a toenail. Get help right away if:  You have a wound, scrape, corn, or callus on your foot and: ? You have pain, swelling, or redness that gets worse. ? You have fluid or blood coming from the wound, scrape, corn, or callus. ? Your wound, scrape, corn, or callus feels warm to the touch. ? You have pus or a bad smell coming from the wound, scrape, corn, or callus. ? You have a fever. ? You have a red line going up your leg. Summary  Check your feet every day  for cuts, sores, red spots, swelling, and blisters.  Moisturize feet and legs daily.  Wear shoes that fit properly and have enough cushioning.  If you have foot problems, report any cuts, sores, or bruises to your health care provider immediately.  Schedule a complete foot exam at least once a year (annually) or more often if you have foot problems. This information is not intended to replace advice given to you by your health care provider. Make sure you discuss any questions you have with your health care provider. Document Released: 05/26/2000 Document Revised: 07/11/2017 Document Reviewed: 06/30/2016 Elsevier Patient Education  2020 Elsevier Inc.   Onychomycosis/Fungal Toenails  WHAT IS IT? An infection that lies within the keratin of your nail plate that is caused by a fungus.  WHY ME? Fungal infections affect all ages, sexes, races, and creeds.  There may be many factors that predispose you to a fungal infection such as age, coexisting medical conditions such as diabetes, or an autoimmune disease; stress, medications, fatigue, genetics, etc.  Bottom line: fungus thrives in a warm, moist environment and your shoes offer such a location.  IS IT CONTAGIOUS? Theoretically, yes.  You do not want to share shoes, nail clippers or files with someone who has fungal toenails.  Walking around barefoot in the same room or sleeping in the same bed is unlikely to transfer the organism.  It is important to realize, however, that fungus can spread easily from one nail to the next on the same foot.  HOW DO WE TREAT THIS?  There are several ways to treat this condition.  Treatment may depend on many factors such as age, medications, pregnancy, liver and kidney conditions, etc.  It is best to ask your doctor which options are available to you.  1. No treatment.   Unlike many other medical concerns, you can live with this condition.  However for many people this can be a painful condition and may lead to  ingrown toenails or a bacterial infection.  It is recommended that you keep the nails cut short to help reduce the amount of fungal nail. 2. Topical treatment.  These range from herbal remedies to prescription strength nail lacquers.  About 40-50% effective, topicals require twice daily application for approximately 9 to 12 months or until an entirely new nail has grown out.  The most effective topicals are medical grade medications available through physicians offices. 3. Oral antifungal medications.  With an 80-90% cure rate, the most common oral medication requires 3 to 4 months of therapy and stays in your system for a year as the new nail grows out.  Oral antifungal medications do require   blood work to make sure it is a safe drug for you.  A liver function panel will be performed prior to starting the medication and after the first month of treatment.  It is important to have the blood work performed to avoid any harmful side effects.  In general, this medication safe but blood work is required. 4. Laser Therapy.  This treatment is performed by applying a specialized laser to the affected nail plate.  This therapy is noninvasive, fast, and non-painful.  It is not covered by insurance and is therefore, out of pocket.  The results have been very good with a 80-95% cure rate.  [facility] is the only practice in the area to offer this therapy. 5. Permanent Nail Avulsion.  Removing the entire nail so that a new nail will not grow back. 

## 2018-12-20 NOTE — Telephone Encounter (Signed)
New Message         COVID-19 Pre-Screening Questions:   In the past 7 to 10 days have you had a cough,  shortness of breath, headache, congestion, fever (100 or greater) body aches, chills, sore throat, or sudden loss of taste or sense of smell? NO  Have you been around anyone with known Covid 19. Pt was POSITIVE COVID March - May Pt has tested NEG in June since she has been home on June 5th   Have you been around anyone who is awaiting Covid 19 test results in the past 7 to 10 days? NO  Have you been around anyone who has been exposed to Covid 19, or has mentioned symptoms of Covid 19 within the past 7 to 10 days? NO  If you have any concerns/questions about symptoms patients report during screening (either on the phone or at threshold). Contact the provider seeing the patient or DOD for further guidance.  If neither are available contact a member of the leadership team.

## 2018-12-20 NOTE — Telephone Encounter (Addendum)
New pt appt with Dr. Acie Fredrickson... pt has answered "yes" to testing positive COVID in the past.. she was in the hosp intubated a few months ago....is now "negative"... LMTCB to be sure the pt is not experiencing any new COVID symptoms prior to her 12/23/18 appt.

## 2018-12-22 NOTE — Progress Notes (Signed)
Subjective: Sabrina Baker is a 78 y.o. y.o. female who presents with h/o diabetes and PAD for preventative foot care today. She presents with painful, discolored, thick toenails and painful callus/corn which interfere with daily activities. Pain is aggravated when wearing enclosed shoe gear. Pain is relieved with periodic professional debridement.  Garwin Brothers, MD is her PCP.   Dr. Legrand Como Altheimer is her Endocrinologist.  Ms. Wynder relates she had COVID-19 and was in the hospital for over a month. She feels she had miraculously recovered as she was on a ventilator when she was hospitalized. She has since tested negative after her hospital course. She denies any new COVID symptoms.    Current Outpatient Medications:  .  CALCIUM-MAGNESIUM-ZINC PO, Take by mouth., Disp: , Rfl:  .  acetaminophen (TYLENOL) 160 MG/5ML solution, Take 20.3 mLs (650 mg total) by mouth every 4 (four) hours as needed for fever., Disp: 120 mL, Rfl: 0 .  albuterol (VENTOLIN HFA) 108 (90 Base) MCG/ACT inhaler, Inhale 2 puffs into the lungs every 4 (four) hours as needed for wheezing., Disp: , Rfl:  .  amLODipine (NORVASC) 5 MG tablet, Take 5 mg by mouth daily., Disp: , Rfl:  .  apixaban (ELIQUIS) 5 MG TABS tablet, Take 1 tablet (5 mg total) by mouth 2 (two) times daily., Disp: 60 tablet, Rfl:  .  atorvastatin (LIPITOR) 10 MG tablet, Take 10 mg by mouth daily., Disp: , Rfl:  .  carvedilol (COREG) 6.25 MG tablet, Take 1 tablet (6.25 mg total) by mouth 2 (two) times daily with a meal., Disp: , Rfl:  .  cholecalciferol (VITAMIN D) 1000 UNITS tablet, Take 1,000 Units by mouth daily., Disp: , Rfl:  .  colchicine 0.6 MG tablet, Take 0.6 mg by mouth daily as needed (gout). Started 04/07/13, Disp: , Rfl:  .  [employer] B-12 500 MCG tablet, Take 1,000 mcg by mouth daily., Disp: , Rfl:  .  DULoxetine (CYMBALTA) 60 MG capsule, Take by mouth daily., Disp: , Rfl:  .  feeding supplement, ENSURE ENLIVE, (ENSURE ENLIVE) LIQD, Take 237 mLs by  mouth daily at 8 pm., Disp: 237 mL, Rfl: 12 .  Ferrous Sulfate (IRON) 325 (65 FE) MG TABS, Take 1 tablet by mouth 3 (three) times daily., Disp: , Rfl:  .  gabapentin (NEURONTIN) 100 MG capsule, Take 100 mg by mouth at bedtime. , Disp: , Rfl: 3 .  Insulin Glargine, 1 Unit Dial, (TOUJEO SOLOSTAR) 300 UNIT/ML SOPN, Inject 10 Units into the skin every morning., Disp: , Rfl:  .  Insulin Pen Needle (FIFTY50 PEN NEEDLES) 32G X 4 MM MISC, USE TO INJECT INSULIN ONCE A DAY, Disp: , Rfl:  .  Lancets 30G MISC, Use to test blood sugars 3 times a day (Dx: E11.65), Disp: , Rfl:  .  latanoprost (XALATAN) 0.005 % ophthalmic solution, Place 1 drop into the left eye at bedtime. , Disp: , Rfl: 4 .  Maltodextrin-Xanthan Gum (RESOURCE THICKENUP CLEAR) POWD, Use as directed for liquid consumption, Disp: , Rfl:  .  ONETOUCH VERIO test strip, USE AS DIRECTED. TESTING FREQUENCY  3 X/DAILY (DX E11.65), Disp: , Rfl:  .  pantoprazole (PROTONIX) 40 MG tablet, Take 40 mg by mouth 2 (two) times daily., Disp: , Rfl:  .  pioglitazone (ACTOS) 15 MG tablet, TAKE 1 TABLET ONCE A DAY FOR DIABETES, Disp: , Rfl:  .  polyethylene glycol (MIRALAX / GLYCOLAX) 17 g packet, Take 17 g by mouth daily., Disp: 14 each, Rfl: 0 .  QUEtiapine (SEROQUEL) 25 MG tablet, Take 1 tablet (25 mg total) by mouth at bedtime., Disp: 30 tablet, Rfl: 0 .  triamcinolone cream (KENALOG) 0.1 %, Apply 1 application topically 2 (two) times daily. , Disp: , Rfl:  .  TRULICITY 1.5 GM/0.1UU SOPN, INJECT 1.5 MG INTO THE SKIN ONCE A WEEK., Disp: , Rfl:   Allergies  Allergen Reactions  . Altace [Ramipril] Anaphylaxis  . Diovan [Valsartan] Other (See Comments)    Hyperkalemia   . Niacin Diarrhea    And nausea  . Niacin And Related Diarrhea    Objective: 78 y.o. Caucasian female, WD, WN in NAD. AAO x 3.   Vascular Examination: Capillary refill time <3 seconds x 10 digits.  Dorsalis pedis pulses 1/4 b/l.  Posterior tibial pulses 1/4 b/l.  No digital hair x  10 digits.  Skin temperature gradient WNL b/l.  Dermatological Examination: Skin thin, shiny and atrophic b/l.  Toenails 1-5 b/l discolored, thick, dystrophic with subungual debris and pain with palpation to nailbeds due to thickness of nails.  Musculoskeletal: Muscle strength 5/5 to all LE muscle groups.  Mild hammertoe contractures lesser digits b/l.  Charcot foot deformity noted plantarlateral rearfoot b/l. Feet appear stable with no erythema, no warmth, no edema.   Neurological: Sensation absent b/l with 10 gram monofilament.   Labs: [id number] Results (tested 10/11/2018) Order: [id number] Status:  Final result  Visible to patient:  No (not released)  Next appt:  12/23/2018 at 08:45 AM in Podiatry [facility]) Specimen Information: Nasopharyngeal Swab; Respiratory      Ref Range & Units 37mo ago  SARS-CoV-2, NAA NOT DETECTED NOT DETECTED   Comment: (NOTE)  This test was developed and its performance characteristics  determined by [employer]. This test has not been FDA  cleared or approved. This test has been authorized by FDA under an  Emergency Use Authorization (EUA). This test is only authorized for  the duration of time the declaration that circumstances exist  justifying the authorization of the emergency use of in vitro  diagnostic tests for detection of SARS-CoV-2 virus and/or diagnosis  of COVID-19 infection under section 564(b)(1) of the Act, 21 U.S.C.  563OVF-6(E)(3), unless the authorization is terminated or revoked  sooner. When diagnostic testing is negative, the possibility of a  false negative result should be considered in the context of a  patient's recent exposures and the presence of clinical signs and  symptoms consistent with COVID-19. An individual without symptoms of  COVID-19 and who is not shedding SARS-CoV-2 virus would expect to  have a negative (not detected) result in this assay.  Performed At: [facility]  [place], [place] [id number]  Rush Farmer MD YS:[id number]   Coronavirus Source  NASOPHARYNGEAL   Comment: Performed at [facility] [address] [place], [place] [address]         Assessment: 1. 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 Encounter for diabetic foot exam  Plan: 1. Encounter for diabetic foot exam today. 2. Continue diabetic foot care principles. Literature dispensed. 3. Toenails 1-5 b/l were debrided in length and girth without iatrogenic bleeding. 4. Patient to continue soft, supportive shoe gear daily. 5. Patient to report any pedal injuries to medical professional immediately. 6. Follow up 3 months.  7. Patient/POA to call should there be a concern in the interim.

## 2018-12-23 ENCOUNTER — Encounter: Payer: Self-pay | Admitting: Cardiovascular Disease

## 2018-12-23 ENCOUNTER — Ambulatory Visit (INDEPENDENT_AMBULATORY_CARE_PROVIDER_SITE_OTHER): Payer: Medicare Other | Admitting: Cardiovascular Disease

## 2018-12-23 ENCOUNTER — Ambulatory Visit: Payer: Medicare Other | Admitting: Orthotics

## 2018-12-23 ENCOUNTER — Other Ambulatory Visit: Payer: Self-pay

## 2018-12-23 VITALS — BP 118/58 | HR 65 | Ht 63.0 in | Wt 162.8 lb

## 2018-12-23 VITALS — Temp 97.2°F

## 2018-12-23 DIAGNOSIS — I480 Paroxysmal atrial fibrillation: Secondary | ICD-10-CM

## 2018-12-23 DIAGNOSIS — R011 Cardiac murmur, unspecified: Secondary | ICD-10-CM

## 2018-12-23 DIAGNOSIS — E11610 Type 2 diabetes mellitus with diabetic neuropathic arthropathy: Secondary | ICD-10-CM

## 2018-12-23 DIAGNOSIS — E119 Type 2 diabetes mellitus without complications: Secondary | ICD-10-CM

## 2018-12-23 DIAGNOSIS — E1142 Type 2 diabetes mellitus with diabetic polyneuropathy: Secondary | ICD-10-CM

## 2018-12-23 DIAGNOSIS — B351 Tinea unguium: Secondary | ICD-10-CM

## 2018-12-23 DIAGNOSIS — M79675 Pain in left toe(s): Secondary | ICD-10-CM

## 2018-12-23 DIAGNOSIS — I4891 Unspecified atrial fibrillation: Secondary | ICD-10-CM | POA: Insufficient documentation

## 2018-12-23 DIAGNOSIS — I340 Nonrheumatic mitral (valve) insufficiency: Secondary | ICD-10-CM

## 2018-12-23 DIAGNOSIS — I10 Essential (primary) hypertension: Secondary | ICD-10-CM | POA: Diagnosis not present

## 2018-12-23 NOTE — Progress Notes (Signed)

## 2018-12-23 NOTE — Patient Instructions (Addendum)
Medication Instructions:  Your physician recommends that you continue on your current medications as directed. Please refer to the Current Medication list given to you today.  If you need a refill on your cardiac medications before your next appointment, please call your pharmacy.    Lab work: None Ordered   Testing/Procedures: Your physician has requested that you have an echocardiogram. Echocardiography is a painless test that uses sound waves to create images of your heart. It provides your doctor with information about the size and shape of your heart and how well your heart's chambers and valves are working. This procedure takes approximately one hour. There are no restrictions for this procedure.    Follow-Up: At [facility], you and your health needs are our priority.  As part of our continuing mission to provide you with exceptional heart care, we have created designated Provider Care Teams.  These Care Teams include your primary Cardiologist (physician) and Advanced Practice Providers (APPs -  Physician Assistants and Nurse Practitioners) who all work together to provide you with the care you need, when you need it. You will need a follow up appointment in:  6 months.  Please call our office 2 months in advance to schedule this appointment.  You may see Dr. Acie Fredrickson or one of the following Advanced Practice Providers on your designated Care Team: Richardson Dopp, PA-C [place], [place] . Daune Perch, NP

## 2018-12-23 NOTE — Progress Notes (Signed)
Cardiology Office Note:    Date:  12/23/2018   ID:  [name], DOB April 17, 1941, MRN [id number]  PCP:  [name], MD  Cardiologist:  Harold Mattes  Electrophysiologist:  None   Referring MD: [name], MD   Chief Complaint  Patient presents with  . Atrial Fibrillation    Problem List 1. Paroxysmal atrial fib: CHADS2VASC = 7 ( female, age, TIA, HTN, DM)   2.  HTN 3, Covid 19 infection , April 2020  4.  DM  History of Present Illness:    Sabrina Baker is a 78 y.o. female with a  Recent hospitalization for COVID-19.  She had a prolonged hospitalization involving prolonged intubation.  She had paroxysmal atrial fibrillation.  She was placed on Eliquis at that time.  She was discharged in normal sinus rhythm.  She has been to [facility], [facility], [facility] She developed atrial fib while she was intubated and sick with COVID 19 . Has improved quite a bit since then.   Breathing has improved.  Has an occasional cough .   Her son died in 11/08/2022 of this year of CHF.   We discussed this    Past Medical History:  Diagnosis Date  . Chronic kidney disease    stage 3  . Diabetes mellitus without complication ([place])   . Hypercholesteremia   . Hypertension   . Neuromuscular disorder (HCC)    neuropathy  . Osteopenia   . Retinal detachment   . TIA (transient ischemic attack)   . Vitamin B12 deficiency     Past Surgical History:  Procedure Laterality Date  . [place] VITRECTOMY WITH 20 GAUGE MVR PORT Right 03/16/2015   Procedure: 25 GAUGE PARS PLANA VITRECTOMY WITH 23 GAUGE MVR PORT;  Surgeon: Hayden Pedro, MD;  Location: [place];  Service: Ophthalmology;  Laterality: Right;  . ANKLE SURGERY    . BREAST EXCISIONAL BIOPSY    . BREAST LUMPECTOMY WITH RADIOACTIVE SEED LOCALIZATION Right 01/24/2016   Procedure: RIGHT BREAST LUMPECTOMY WITH RADIOACTIVE SEED LOCALIZATION;  Surgeon: Coralie Keens, MD;  Location: [place];  Service:  General;  Laterality: Right;  . EYE SURGERY     detached retna X2  . HEEL SPUR SURGERY  2012  . INJECTION OF SILICONE OIL Right 23/10/3612   Procedure: Extraction OF SILICONE OIL;  Surgeon: Hayden Pedro, MD;  Location: [place];  Service: Ophthalmology;  Laterality: Right;  . ORIF ANKLE FRACTURE Left 06/17/2014   Procedure: OPEN REDUCTION INTERNAL FIXATION (ORIF) LEFT BIMALLEOLAR ANKLE FRACTURE;  Surgeon: Marianna Payment, MD;  Location: [place];  Service: Orthopedics;  Laterality: Left;  . PHOTOCOAGULATION WITH LASER Right 03/16/2015   Procedure: PHOTOCOAGULATION WITH LASER;  Surgeon: Hayden Pedro, MD;  Location: [place];  Service: Ophthalmology;  Laterality: Right;  . SHOULDER ARTHROSCOPY Right    plates and screws  . VITRECTOMY Right 03/16/2015  . WRIST ARTHROPLASTY Left    plates and screws and bone graft    Current Medications: Current Meds  Medication Sig  . acetaminophen (TYLENOL) 160 MG/5ML solution Take 20.3 mLs (650 mg total) by mouth every 4 (four) hours as needed for fever.  Marland Kitchen amLODipine (NORVASC) 5 MG tablet Take 5 mg by mouth daily.  Marland Kitchen apixaban (ELIQUIS) 5 MG TABS tablet Take 1 tablet (5 mg total) by mouth 2 (two) times daily.  Marland Kitchen atorvastatin (LIPITOR) 10 MG tablet Take 10 mg by mouth daily.  Marland Kitchen CALCIUM-MAGNESIUM-ZINC PO Take by  mouth.  . carvedilol (COREG) 25 MG tablet Take 25 mg by mouth 2 (two) times daily with a meal.  . cholecalciferol (VITAMIN D) 1000 UNITS tablet Take 1,000 Units by mouth daily.  . colchicine 0.6 MG tablet Take 0.6 mg by mouth daily as needed (gout). Started 04/07/13  . [employer]S B-12 500 MCG tablet Take 1,000 mcg by mouth daily.  . DULoxetine (CYMBALTA) 60 MG capsule Take by mouth daily.  . feeding supplement, ENSURE ENLIVE, (ENSURE ENLIVE) LIQD Take 237 mLs by mouth daily at 8 pm.  . Ferrous Sulfate (IRON) 325 (65 FE) MG TABS Take 1 tablet by mouth 3 (three) times daily.  Marland Kitchen gabapentin (NEURONTIN) 100 MG capsule Take 100 mg by mouth at bedtime.   .  Insulin Glargine, 2 Unit Dial, (TOUJEO MAX SOLOSTAR) 300 UNIT/ML SOPN Inject 12 Units into the skin daily.  . Insulin Pen Needle (FIFTY50 PEN NEEDLES) 32G X 4 MM MISC USE TO INJECT INSULIN ONCE A DAY  . Lancets 30G MISC Use to test blood sugars 3 times a day (Dx: E11.65)  . latanoprost (XALATAN) 0.005 % ophthalmic solution Place 1 drop into the left eye at bedtime.   . Magnesium 500 MG CAPS Take 1 capsule by mouth daily.  . mupirocin ointment (BACTROBAN) 2 % Place 1 application into the nose as needed.  Glory Rosebush VERIO test strip USE AS DIRECTED. TESTING FREQUENCY  3 X/DAILY (DX E11.65)  . pantoprazole (PROTONIX) 40 MG tablet Take 40 mg by mouth 2 (two) times daily.  . pioglitazone (ACTOS) 15 MG tablet TAKE 1 TABLET ONCE A DAY FOR DIABETES  . triamcinolone cream (KENALOG) 0.1 % Apply 1 application topically 2 (two) times daily.   . TRULICITY 1.5 RW/4.3XV SOPN INJECT 1.5 MG INTO THE SKIN ONCE A WEEK.     Allergies:   Altace [ramipril], Diovan [valsartan], Niacin, and Niacin and related   Social History   Socioeconomic History  . Marital status: Divorced    Spouse name: Not on file  . Number of children: Not on file  . Years of education: Not on file  . Highest education level: Not on file  Occupational History  . Not on file  Social Needs  . Financial resource strain: Not on file  . Food insecurity    Worry: Not on file    Inability: Not on file  . Transportation needs    Medical: Not on file    Non-medical: Not on file  Tobacco Use  . Smoking status: Never Smoker  . Smokeless tobacco: Never Used  Substance and Sexual Activity  . Alcohol use: No  . Drug use: No  . Sexual activity: Never    Birth control/protection: Post-menopausal  Lifestyle  . Physical activity    Days per week: Not on file    Minutes per session: Not on file  . Stress: Not on file  Relationships  . Social Herbalist on phone: Not on file    Gets together: Not on file    Attends religious  service: Not on file    Active member of club or organization: Not on file    Attends meetings of clubs or organizations: Not on file    Relationship status: Not on file  Other Topics Concern  . Not on file  Social History Narrative  . Not on file     Family History: The patient's family history is not on file.  ROS:   Please see the history of  present illness.     All other systems reviewed and are negative.  EKGs/Labs/Other Studies Reviewed:    The following studies were reviewed today:   EKG:   Recent Labs: 09/25/2018: B Natriuretic Peptide 278.8 09/27/2018: TSH 0.551 10/03/2018: ALT 22 10/08/2018: Hemoglobin 8.6; Platelets 346 10/10/2018: BUN 19; Creatinine, Ser 0.98; Magnesium 2.0; Potassium 4.2; Sodium 139  Recent Lipid Panel    Component Value Date/Time   CHOL 134 04/18/2012 0540   TRIG 76 09/22/2018 0254   HDL 58 04/18/2012 0540   CHOLHDL 2.3 04/18/2012 0540   VLDL 14 04/18/2012 0540   LDLCALC 62 04/18/2012 0540    Physical Exam:    VS:  BP (!) 118/58   Pulse 65   Ht 5\' 3"  (1.6 m)   Wt 162 lb 12.8 oz (73.8 kg)   SpO2 97%   BMI 28.84 kg/m     Wt Readings from Last 3 Encounters:  12/23/18 162 lb 12.8 oz (73.8 kg)  10/11/18 148 lb 11.2 oz (67.4 kg)  06/21/17 155 lb (70.3 kg)     GEN:  Obese, elderly female,  well developed in no acute distress HEENT: Normal NECK: No JVD; No carotid bruits LYMPHATICS: No lymphadenopathy CARDIAC: RRR, no murmurs, rubs, gallops RESPIRATORY:  Clear to auscultation without rales, wheezing or rhonchi  ABDOMEN: Soft, non-tender, non-distended MUSCULOSKELETAL:  No edema; No deformity  SKIN: Warm and dry NEUROLOGIC:  Alert and oriented x 3 PSYCHIATRIC:  Normal affect   ASSESSMENT:    1. PAF (paroxysmal atrial fibrillation) ([place])   2. Mitral valve insufficiency, unspecified etiology   3. Cardiac murmur    PLAN:    In order of problems listed above:  1. PAF:   CHADS2VASC is 17 ( female, age, TIA, HTN, DM ).  I would  recommend that she stay on the eliquis lifelong.   We discussed the long term affects of AF .  Cont. Current meds.   i'll see  In 6 months .   2. HTN:   Cont meds.   3.  Hyperlipidema:  Followed by Dr. Dema Severin.    Medication Adjustments/Labs and Tests Ordered: Current medicines are reviewed at length with the patient today.  Concerns regarding medicines are outlined above.  Orders Placed This Encounter  Procedures  . ECHOCARDIOGRAM COMPLETE   No orders of the defined types were placed in this encounter.   Patient Instructions  Medication Instructions:  Your physician recommends that you continue on your current medications as directed. Please refer to the Current Medication list given to you today.  If you need a refill on your cardiac medications before your next appointment, please call your pharmacy.    Lab work: None Ordered   Testing/Procedures: Your physician has requested that you have an echocardiogram. Echocardiography is a painless test that uses sound waves to create images of your heart. It provides your doctor with information about the size and shape of your heart and how well your heart's chambers and valves are working. This procedure takes approximately one hour. There are no restrictions for this procedure.    Follow-Up: At [facility], you and your health needs are our priority.  As part of our continuing mission to provide you with exceptional heart care, we have created designated Provider Care Teams.  These Care Teams include your primary Cardiologist (physician) and Advanced Practice Providers (APPs -  Physician Assistants and Nurse Practitioners) who all work together to provide you with the care you need, when you need it.  You will need a follow up appointment in:  6 months.  Please call our office 2 months in advance to schedule this appointment.  You may see Dr. Acie Fredrickson or one of the following Advanced Practice Providers on your designated Care Team:  Richardson Dopp, PA-C [place], [place] . Daune Perch, NP      Signed, Mertie Moores, MD  12/23/2018 6:37 PM    [place]

## 2018-12-26 ENCOUNTER — Encounter (INDEPENDENT_AMBULATORY_CARE_PROVIDER_SITE_OTHER): Payer: Medicare Other | Admitting: Ophthalmology

## 2018-12-27 ENCOUNTER — Other Ambulatory Visit: Payer: Self-pay

## 2018-12-27 ENCOUNTER — Encounter (INDEPENDENT_AMBULATORY_CARE_PROVIDER_SITE_OTHER): Payer: Medicare Other | Admitting: Ophthalmology

## 2018-12-27 ENCOUNTER — Ambulatory Visit (HOSPITAL_COMMUNITY): Payer: Medicare Other | Attending: Internal Medicine

## 2018-12-27 DIAGNOSIS — E113312 Type 2 diabetes mellitus with moderate nonproliferative diabetic retinopathy with macular edema, left eye: Secondary | ICD-10-CM | POA: Diagnosis not present

## 2018-12-27 DIAGNOSIS — I340 Nonrheumatic mitral (valve) insufficiency: Secondary | ICD-10-CM | POA: Insufficient documentation

## 2018-12-27 DIAGNOSIS — E11311 Type 2 diabetes mellitus with unspecified diabetic retinopathy with macular edema: Secondary | ICD-10-CM | POA: Diagnosis not present

## 2018-12-27 DIAGNOSIS — E113591 Type 2 diabetes mellitus with proliferative diabetic retinopathy without macular edema, right eye: Secondary | ICD-10-CM | POA: Diagnosis not present

## 2018-12-27 DIAGNOSIS — R011 Cardiac murmur, unspecified: Secondary | ICD-10-CM | POA: Diagnosis not present

## 2018-12-27 DIAGNOSIS — H35372 Puckering of macula, left eye: Secondary | ICD-10-CM | POA: Diagnosis not present

## 2018-12-27 DIAGNOSIS — I480 Paroxysmal atrial fibrillation: Secondary | ICD-10-CM

## 2018-12-27 DIAGNOSIS — H35033 Hypertensive retinopathy, bilateral: Secondary | ICD-10-CM

## 2018-12-27 DIAGNOSIS — I10 Essential (primary) hypertension: Secondary | ICD-10-CM

## 2018-12-27 DIAGNOSIS — H43812 Vitreous degeneration, left eye: Secondary | ICD-10-CM

## 2019-01-01 ENCOUNTER — Telehealth: Payer: Self-pay | Admitting: Podiatry

## 2019-01-01 NOTE — Telephone Encounter (Signed)
Pt left message checking on status of diabetic inserts she ordered last week on 7.13.2020...   I returned call and explained we are still waiting on paperwork from the doctor before the inserts are made. They were being sent to Dr Dema Severin and pt stated she see's Dr Altheimer for her diabetes and so I have changed it in safestep and faxed to Dr Altheimer's office.

## 2019-01-09 ENCOUNTER — Ambulatory Visit
Admission: RE | Admit: 2019-01-09 | Discharge: 2019-01-09 | Disposition: A | Discharge status: Home / Self Care | Payer: Medicare Other | Source: Ambulatory Visit | Attending: Family Medicine | Admitting: Family Medicine

## 2019-01-09 ENCOUNTER — Other Ambulatory Visit: Payer: Self-pay

## 2019-01-09 DIAGNOSIS — Z1231 Encounter for screening mammogram for malignant neoplasm of breast: Secondary | ICD-10-CM

## 2019-01-23 ENCOUNTER — Encounter (INDEPENDENT_AMBULATORY_CARE_PROVIDER_SITE_OTHER): Payer: Medicare Other | Admitting: Ophthalmology

## 2019-01-23 ENCOUNTER — Other Ambulatory Visit: Payer: Self-pay

## 2019-01-23 DIAGNOSIS — E113591 Type 2 diabetes mellitus with proliferative diabetic retinopathy without macular edema, right eye: Secondary | ICD-10-CM

## 2019-01-23 DIAGNOSIS — H33302 Unspecified retinal break, left eye: Secondary | ICD-10-CM

## 2019-01-23 DIAGNOSIS — E11311 Type 2 diabetes mellitus with unspecified diabetic retinopathy with macular edema: Secondary | ICD-10-CM | POA: Diagnosis not present

## 2019-01-23 DIAGNOSIS — E113312 Type 2 diabetes mellitus with moderate nonproliferative diabetic retinopathy with macular edema, left eye: Secondary | ICD-10-CM

## 2019-01-23 DIAGNOSIS — I10 Essential (primary) hypertension: Secondary | ICD-10-CM

## 2019-01-23 DIAGNOSIS — H35033 Hypertensive retinopathy, bilateral: Secondary | ICD-10-CM

## 2019-01-23 DIAGNOSIS — H43813 Vitreous degeneration, bilateral: Secondary | ICD-10-CM

## 2019-01-23 DIAGNOSIS — H338 Other retinal detachments: Secondary | ICD-10-CM

## 2019-02-18 ENCOUNTER — Other Ambulatory Visit: Payer: Self-pay

## 2019-02-18 ENCOUNTER — Ambulatory Visit (INDEPENDENT_AMBULATORY_CARE_PROVIDER_SITE_OTHER): Payer: Medicare Other | Admitting: Orthotics

## 2019-02-18 DIAGNOSIS — M2141 Flat foot [pes planus] (acquired), right foot: Secondary | ICD-10-CM

## 2019-02-18 DIAGNOSIS — M14671 Charcot's joint, right ankle and foot: Secondary | ICD-10-CM

## 2019-02-18 DIAGNOSIS — E119 Type 2 diabetes mellitus without complications: Secondary | ICD-10-CM

## 2019-02-18 DIAGNOSIS — E1141 Type 2 diabetes mellitus with diabetic mononeuropathy: Secondary | ICD-10-CM | POA: Diagnosis not present

## 2019-02-18 DIAGNOSIS — M14672 Charcot's joint, left ankle and foot: Secondary | ICD-10-CM | POA: Diagnosis not present

## 2019-02-18 DIAGNOSIS — E1142 Type 2 diabetes mellitus with diabetic polyneuropathy: Secondary | ICD-10-CM

## 2019-02-18 DIAGNOSIS — M2042 Other hammer toe(s) (acquired), left foot: Secondary | ICD-10-CM

## 2019-02-18 DIAGNOSIS — M2142 Flat foot [pes planus] (acquired), left foot: Secondary | ICD-10-CM

## 2019-02-18 DIAGNOSIS — E11610 Type 2 diabetes mellitus with diabetic neuropathic arthropathy: Secondary | ICD-10-CM

## 2019-02-18 DIAGNOSIS — Z0189 Encounter for other specified special examinations: Secondary | ICD-10-CM

## 2019-02-18 DIAGNOSIS — M2041 Other hammer toe(s) (acquired), right foot: Secondary | ICD-10-CM

## 2019-02-18 NOTE — Progress Notes (Signed)

## 2019-02-20 ENCOUNTER — Encounter (INDEPENDENT_AMBULATORY_CARE_PROVIDER_SITE_OTHER): Payer: Medicare Other | Admitting: Ophthalmology

## 2019-02-20 ENCOUNTER — Other Ambulatory Visit: Payer: Self-pay

## 2019-02-20 DIAGNOSIS — I10 Essential (primary) hypertension: Secondary | ICD-10-CM | POA: Diagnosis not present

## 2019-02-20 DIAGNOSIS — E11311 Type 2 diabetes mellitus with unspecified diabetic retinopathy with macular edema: Secondary | ICD-10-CM | POA: Diagnosis not present

## 2019-02-20 DIAGNOSIS — E113511 Type 2 diabetes mellitus with proliferative diabetic retinopathy with macular edema, right eye: Secondary | ICD-10-CM

## 2019-02-20 DIAGNOSIS — H43812 Vitreous degeneration, left eye: Secondary | ICD-10-CM

## 2019-02-20 DIAGNOSIS — E113312 Type 2 diabetes mellitus with moderate nonproliferative diabetic retinopathy with macular edema, left eye: Secondary | ICD-10-CM | POA: Diagnosis not present

## 2019-02-20 DIAGNOSIS — H35033 Hypertensive retinopathy, bilateral: Secondary | ICD-10-CM

## 2019-02-20 DIAGNOSIS — H338 Other retinal detachments: Secondary | ICD-10-CM

## 2019-03-20 ENCOUNTER — Encounter (INDEPENDENT_AMBULATORY_CARE_PROVIDER_SITE_OTHER): Payer: Medicare Other | Admitting: Ophthalmology

## 2019-03-20 DIAGNOSIS — H43812 Vitreous degeneration, left eye: Secondary | ICD-10-CM

## 2019-03-20 DIAGNOSIS — E11311 Type 2 diabetes mellitus with unspecified diabetic retinopathy with macular edema: Secondary | ICD-10-CM

## 2019-03-20 DIAGNOSIS — H338 Other retinal detachments: Secondary | ICD-10-CM

## 2019-03-20 DIAGNOSIS — E113591 Type 2 diabetes mellitus with proliferative diabetic retinopathy without macular edema, right eye: Secondary | ICD-10-CM

## 2019-03-20 DIAGNOSIS — E113212 Type 2 diabetes mellitus with mild nonproliferative diabetic retinopathy with macular edema, left eye: Secondary | ICD-10-CM

## 2019-03-20 DIAGNOSIS — I10 Essential (primary) hypertension: Secondary | ICD-10-CM | POA: Diagnosis not present

## 2019-03-20 DIAGNOSIS — H35033 Hypertensive retinopathy, bilateral: Secondary | ICD-10-CM

## 2019-03-21 ENCOUNTER — Ambulatory Visit (INDEPENDENT_AMBULATORY_CARE_PROVIDER_SITE_OTHER): Payer: Medicare Other | Admitting: Podiatry

## 2019-03-21 ENCOUNTER — Encounter: Payer: Self-pay | Admitting: Podiatry

## 2019-03-21 ENCOUNTER — Ambulatory Visit: Payer: Medicare Other

## 2019-03-21 ENCOUNTER — Other Ambulatory Visit: Payer: Self-pay

## 2019-03-21 DIAGNOSIS — B351 Tinea unguium: Secondary | ICD-10-CM | POA: Diagnosis not present

## 2019-03-21 DIAGNOSIS — M79674 Pain in right toe(s): Secondary | ICD-10-CM | POA: Diagnosis not present

## 2019-03-21 DIAGNOSIS — E1142 Type 2 diabetes mellitus with diabetic polyneuropathy: Secondary | ICD-10-CM | POA: Diagnosis not present

## 2019-03-21 DIAGNOSIS — M79675 Pain in left toe(s): Secondary | ICD-10-CM

## 2019-03-21 DIAGNOSIS — E11610 Type 2 diabetes mellitus with diabetic neuropathic arthropathy: Secondary | ICD-10-CM

## 2019-03-21 NOTE — Patient Instructions (Signed)
Diabetes Mellitus and Foot Care Foot care is an important part of your health, especially when you have diabetes. Diabetes may cause you to have problems because of poor blood flow (circulation) to your feet and legs, which can cause your skin to:  Become thinner and drier.  Break more easily.  Heal more slowly.  Peel and crack. You may also have nerve damage (neuropathy) in your legs and feet, causing decreased feeling in them. This means that you may not notice minor injuries to your feet that could lead to more serious problems. Noticing and addressing any potential problems early is the best way to prevent future foot problems. How to care for your feet Foot hygiene  Wash your feet daily with warm water and mild soap. Do not use hot water. Then, pat your feet and the areas between your toes until they are completely dry. Do not soak your feet as this can dry your skin.  Trim your toenails straight across. Do not dig under them or around the cuticle. File the edges of your nails with an emery board or nail file.  Apply a moisturizing lotion or petroleum jelly to the skin on your feet and to dry, brittle toenails. Use lotion that does not contain alcohol and is unscented. Do not apply lotion between your toes. Shoes and socks  Wear clean socks or stockings every day. Make sure they are not too tight. Do not wear knee-high stockings since they may decrease blood flow to your legs.  Wear shoes that fit properly and have enough cushioning. Always look in your shoes before you put them on to be sure there are no objects inside.  To break in new shoes, wear them for just a few hours a day. This prevents injuries on your feet. Wounds, scrapes, corns, and calluses  Check your feet daily for blisters, cuts, bruises, sores, and redness. If you cannot see the bottom of your feet, use a mirror or ask someone for help.  Do not cut corns or calluses or try to remove them with medicine.  If you  find a minor scrape, cut, or break in the skin on your feet, keep it and the skin around it clean and dry. You may clean these areas with mild soap and water. Do not clean the area with peroxide, alcohol, or iodine.  If you have a wound, scrape, corn, or callus on your foot, look at it several times a day to make sure it is healing and not infected. Check for: ? Redness, swelling, or pain. ? Fluid or blood. ? Warmth. ? Pus or a bad smell. General instructions  Do not cross your legs. This may decrease blood flow to your feet.  Do not use heating pads or hot water bottles on your feet. They may burn your skin. If you have lost feeling in your feet or legs, you may not know this is happening until it is too late.  Protect your feet from hot and cold by wearing shoes, such as at the beach or on hot pavement.  Schedule a complete foot exam at least once a year (annually) or more often if you have foot problems. If you have foot problems, report any cuts, sores, or bruises to your health care provider immediately. Contact a health care provider if:  You have a medical condition that increases your risk of infection and you have any cuts, sores, or bruises on your feet.  You have an injury that is not   healing.  You have redness on your legs or feet.  You feel burning or tingling in your legs or feet.  You have pain or cramps in your legs and feet.  Your legs or feet are numb.  Your feet always feel cold.  You have pain around a toenail. Get help right away if:  You have a wound, scrape, corn, or callus on your foot and: ? You have pain, swelling, or redness that gets worse. ? You have fluid or blood coming from the wound, scrape, corn, or callus. ? Your wound, scrape, corn, or callus feels warm to the touch. ? You have pus or a bad smell coming from the wound, scrape, corn, or callus. ? You have a fever. ? You have a red line going up your leg. Summary  Check your feet every day  for cuts, sores, red spots, swelling, and blisters.  Moisturize feet and legs daily.  Wear shoes that fit properly and have enough cushioning.  If you have foot problems, report any cuts, sores, or bruises to your health care provider immediately.  Schedule a complete foot exam at least once a year (annually) or more often if you have foot problems. This information is not intended to replace advice given to you by your health care provider. Make sure you discuss any questions you have with your health care provider. Document Released: 05/26/2000 Document Revised: 07/11/2017 Document Reviewed: 06/30/2016 Elsevier Patient Education  2020 Elsevier Inc.   Onychomycosis/Fungal Toenails  WHAT IS IT? An infection that lies within the keratin of your nail plate that is caused by a fungus.  WHY ME? Fungal infections affect all ages, sexes, races, and creeds.  There may be many factors that predispose you to a fungal infection such as age, coexisting medical conditions such as diabetes, or an autoimmune disease; stress, medications, fatigue, genetics, etc.  Bottom line: fungus thrives in a warm, moist environment and your shoes offer such a location.  IS IT CONTAGIOUS? Theoretically, yes.  You do not want to share shoes, nail clippers or files with someone who has fungal toenails.  Walking around barefoot in the same room or sleeping in the same bed is unlikely to transfer the organism.  It is important to realize, however, that fungus can spread easily from one nail to the next on the same foot.  HOW DO WE TREAT THIS?  There are several ways to treat this condition.  Treatment may depend on many factors such as age, medications, pregnancy, liver and kidney conditions, etc.  It is best to ask your doctor which options are available to you.  1. No treatment.   Unlike many other medical concerns, you can live with this condition.  However for many people this can be a painful condition and may lead to  ingrown toenails or a bacterial infection.  It is recommended that you keep the nails cut short to help reduce the amount of fungal nail. 2. Topical treatment.  These range from herbal remedies to prescription strength nail lacquers.  About 40-50% effective, topicals require twice daily application for approximately 9 to 12 months or until an entirely new nail has grown out.  The most effective topicals are medical grade medications available through physicians offices. 3. Oral antifungal medications.  With an 80-90% cure rate, the most common oral medication requires 3 to 4 months of therapy and stays in your system for a year as the new nail grows out.  Oral antifungal medications do require   blood work to make sure it is a safe drug for you.  A liver function panel will be performed prior to starting the medication and after the first month of treatment.  It is important to have the blood work performed to avoid any harmful side effects.  In general, this medication safe but blood work is required. 4. Laser Therapy.  This treatment is performed by applying a specialized laser to the affected nail plate.  This therapy is noninvasive, fast, and non-painful.  It is not covered by insurance and is therefore, out of pocket.  The results have been very good with a 80-95% cure rate.  [facility] is the only practice in the area to offer this therapy. 5. Permanent Nail Avulsion.  Removing the entire nail so that a new nail will not grow back. 

## 2019-03-23 NOTE — Progress Notes (Signed)
Subjective: Sabrina Baker is a 78 y.o. y.o. 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   She relates right heel pain on today for the past week. Denies any h/o trauma. Location is lateral aspect of posterior calcaneus. She  is concerned her Charcot is active. Would like to make sure she has no fractures.  Current Outpatient Medications on File Prior to Visit  Medication Sig Dispense Refill  . acetaminophen (TYLENOL) 160 MG/5ML solution Take 20.3 mLs (650 mg total) by mouth every 4 (four) hours as needed for fever. 120 mL 0  . amLODipine (NORVASC) 5 MG tablet Take 5 mg by mouth daily.    Marland Kitchen apixaban (ELIQUIS) 5 MG TABS tablet Take 1 tablet (5 mg total) by mouth 2 (two) times daily. 60 tablet   . atorvastatin (LIPITOR) 10 MG tablet Take 10 mg by mouth daily.    . bacitracin-polymyxin b (POLYSPORIN) ointment Apply topically.    Marland Kitchen CALCIUM-MAGNESIUM-ZINC PO Take by mouth.    . carvedilol (COREG) 25 MG tablet Take 25 mg by mouth 2 (two) times daily with a meal.    . cholecalciferol (VITAMIN D) 1000 UNITS tablet Take 1,000 Units by mouth daily.    . colchicine 0.6 MG tablet Take 0.6 mg by mouth daily as needed (gout). Started 04/07/13    . CVS B-12 500 MCG tablet Take 1,000 mcg by mouth daily.    . DULoxetine (CYMBALTA) 60 MG capsule Take by mouth daily.    . feeding supplement, ENSURE ENLIVE, (ENSURE ENLIVE) LIQD Take 237 mLs by mouth daily at 8 pm. 237 mL 12  . Ferrous Sulfate (IRON) 325 (65 FE) MG TABS Take 1 tablet by mouth 3 (three) times daily.    Marland Kitchen gabapentin (NEURONTIN) 100 MG capsule Take 100 mg by mouth at bedtime.   3  . Insulin Glargine, 2 Unit Dial, (TOUJEO MAX SOLOSTAR) 300 UNIT/ML SOPN Inject 12 Units into the skin daily.    . Insulin Pen Needle (FIFTY50 PEN NEEDLES) 32G X 4 MM MISC USE TO INJECT INSULIN ONCE A DAY    . ketoconazole (NIZORAL) 2 %  shampoo USE AS INSTRUCTED EVERY OTHER DAY    . Lancets 30G MISC Use to test blood sugars 3 times a day (Dx: E11.65)    . latanoprost (XALATAN) 0.005 % ophthalmic solution Place 1 drop into the left eye at bedtime.   4  . Magnesium 500 MG CAPS Take 1 capsule by mouth daily.    . mupirocin ointment (BACTROBAN) 2 % Place 1 application into the nose as needed.    Glory Rosebush VERIO test strip USE AS DIRECTED. TESTING FREQUENCY  3 X/DAILY (DX E11.65)    . pantoprazole (PROTONIX) 40 MG tablet Take 40 mg by mouth 2 (two) times daily.    . pioglitazone (ACTOS) 15 MG tablet TAKE 1 TABLET ONCE A DAY FOR DIABETES    . triamcinolone cream (KENALOG) 0.1 % Apply 1 application topically 2 (two) times daily.     Marland Kitchen trimethoprim-polymyxin b (POLYTRIM) ophthalmic solution INSTILL ONE DROP INTO LEFT EYE 4 TIMES A DAY FOR 2 DAYS AFTER MONTHLY EYE INJECTION    . TRULICITY 1.5 0000000 SOPN INJECT 1.5 MG INTO THE SKIN ONCE A WEEK.     No current facility-administered medications on file prior to visit.      Allergies  Allergen Reactions  . Altace [Ramipril] Anaphylaxis  .  Diovan [Valsartan] Other (See Comments)    Hyperkalemia   . Niacin Diarrhea    And nausea  . Niacin And Related Diarrhea    Objective: Vascular Examination: Capillary refill time <3 seconds.   Dorsalis pedis pulses and Posterior tibial pulses faintly palpable b/l.  No digital hair x 10 digits.  Skin temperature gradient WNL b/l.  Dermatological Examination: Skin thin, shiny and atrophic b/l.   Toenails 1-5 b/l discolored, thick, dystrophic with subungual debris and pain with palpation to nailbeds due to thickness of nails.  No breaks in skin/open wounds at location of Charcot deformity b/l.  Musculoskeletal: Muscle strength 5/5 to all LE muscle groups.  Hammertoe deformity lesser digits b/l.  Charcot foot deformity plantarlateral rearfoot b/l. No erythema, no increased warmth, no edema.   Neurological: Sensation absent b/l  with 10 gram monofilament.  Xray right foot: Evidence of generalized osteopenia throughout foot No evidence of fracture noted right foot No gas in tissues No bone erosion noted right foot  Assessment: Painful onychomycosis toenails 1-5 b/l  Charcot Neuroarthropathy b/l  Plan: 1. Toenails 1-5 b/l were debrided in length and girth without iatrogenic bleeding. 2. Xray right foot taken/reviewed with Ms. Deprey on today. 3. Inspected right shoe and made sure insert properly placed. She related relief in symptom related to heel pain, but I did have her see Betha in Orthotics and Prosthetics for further evaluation of shoe/insert. 4. Patient to continue soft, supportive shoe gear daily. 5. Patient to report any pedal injuries to medical professional immediately. 6. Avoid self trimming due to use of blood thinner. 7. Follow up 3 months.  8. Patient/POA to call should there be a concern in the interim.

## 2019-04-16 ENCOUNTER — Encounter (INDEPENDENT_AMBULATORY_CARE_PROVIDER_SITE_OTHER): Payer: Medicare Other | Admitting: Ophthalmology

## 2019-04-16 ENCOUNTER — Other Ambulatory Visit: Payer: Self-pay

## 2019-04-16 DIAGNOSIS — E113511 Type 2 diabetes mellitus with proliferative diabetic retinopathy with macular edema, right eye: Secondary | ICD-10-CM

## 2019-04-16 DIAGNOSIS — E113312 Type 2 diabetes mellitus with moderate nonproliferative diabetic retinopathy with macular edema, left eye: Secondary | ICD-10-CM | POA: Diagnosis not present

## 2019-04-16 DIAGNOSIS — I10 Essential (primary) hypertension: Secondary | ICD-10-CM | POA: Diagnosis not present

## 2019-04-16 DIAGNOSIS — H35033 Hypertensive retinopathy, bilateral: Secondary | ICD-10-CM

## 2019-04-16 DIAGNOSIS — H338 Other retinal detachments: Secondary | ICD-10-CM

## 2019-04-16 DIAGNOSIS — E11311 Type 2 diabetes mellitus with unspecified diabetic retinopathy with macular edema: Secondary | ICD-10-CM | POA: Diagnosis not present

## 2019-04-16 DIAGNOSIS — H43812 Vitreous degeneration, left eye: Secondary | ICD-10-CM

## 2019-04-17 ENCOUNTER — Ambulatory Visit (INDEPENDENT_AMBULATORY_CARE_PROVIDER_SITE_OTHER): Payer: Medicare Other | Admitting: Orthopaedic Surgery

## 2019-04-17 ENCOUNTER — Ambulatory Visit: Payer: Self-pay

## 2019-04-17 ENCOUNTER — Encounter: Payer: Self-pay | Admitting: Orthopaedic Surgery

## 2019-04-17 ENCOUNTER — Telehealth: Payer: Self-pay

## 2019-04-17 DIAGNOSIS — M25511 Pain in right shoulder: Secondary | ICD-10-CM | POA: Diagnosis not present

## 2019-04-17 DIAGNOSIS — G8929 Other chronic pain: Secondary | ICD-10-CM | POA: Diagnosis not present

## 2019-04-17 DIAGNOSIS — M5412 Radiculopathy, cervical region: Secondary | ICD-10-CM

## 2019-04-17 MED ORDER — HYDROCODONE-ACETAMINOPHEN 5-325 MG PO TABS: 1.0000 | ORAL_TABLET | Freq: Four times a day (QID) | ORAL | 0 refills | Status: DC | PRN

## 2019-04-17 NOTE — Progress Notes (Signed)
Office Visit Note   Patient: Sabrina Baker           Date of Birth: 25-Jul-1940           MRN: [id number] Visit Date: 04/17/2019              Requested by: [name], MD [address] [place],  [place] [address] PCP: [name], MD   Assessment & Plan: Visit Diagnoses:  1. Chronic right shoulder pain   2. Radiculopathy of cervical spine     Plan: Impression is #1 right shoulder proximal humerus bony resorption and #2 cervical spine radiculopathy.  We will refer the patient to Dr. Junius Roads for an ultrasound-guided shoulder injection.  I have also discussed with the patient the benefit of physical therapy to help regain strength but she notes that her schedule is too full.  She will follow-up with [place] as needed.  Follow-Up Instructions: No follow-ups on file.   Orders:  Orders Placed This Encounter  Procedures  . XR Shoulder Right  . XR Cervical Spine 2 or 3 views   Meds ordered this encounter  Medications  . HYDROcodone-acetaminophen (NORCO/VICODIN) 5-325 MG tablet    Sig: Take 1 tablet by mouth every 6 (six) hours as needed for moderate pain.    Dispense:  10 tablet    Refill:  0      Procedures: No procedures performed   Clinical Data: No additional findings.   Subjective: Chief Complaint  Patient presents with  . Right Shoulder - Pain    HPI patient is a 78 year old female who presents our clinic today with right shoulder and right lateral neck pain.  This began approximately 6 months ago while in the hospital recovering from Covid.  She had been ventilated for over 2 weeks prior to this where she was primarily in a prone position.  The pain she has to the lateral neck which radiates down the entire arm and into the parascapular region.  She has associated weakness with the right upper extremity.  Her pain is exacerbated with any movement of the shoulder.  She denies any numbness, tingling or burning.  No previous cortisone injection to the  shoulder or cervical spine.  She does have a history of an ORIF of a right proximal humerus fracture by Dr. Louanne Skye in 2006.  She was seen in our office last year where it was noted that she had resorption of the humeral head.  She is not having significant pain at that time.  Review of Systems as detailed in HPI.  All others reviewed and are negative.   Objective: Vital Signs: There were no vitals taken for this visit.  Physical Exam well-developed well-nourished female in no acute distress.  Alert and oriented x3.  Ortho Exam examination of her neck reveals increased pain with lateral rotation right greater than left.  No spinous or paraspinous tenderness.  She does have tenderness the parascapular region on the right.  Right shoulder exam shows 50% active range of motion.  She can passively get to near full range of motion.  3 out of 5 strength throughout.  She is neurovascularly intact distally.  Specialty Comments:  No specialty comments available.  Imaging: US Guided Needle Placement, No Charges  Result Date: 04/17/2019 Please see Notes tab for imaging impression.  Xr Cervical Spine 2 Or 3 Views  Result Date: 04/17/2019 Significant degenerative disc disease throughout  Xr Shoulder Right  Result Date: 04/17/2019 X-rays demonstrate a  healed proximal humerus with plate fixation without hardware complication.  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  She has bone on bone of the [place] joint.    PMFS History: Patient Active Problem List   Diagnosis Date Noted  . Atrial fibrillation ([place]) 12/23/2018  . ARDS (adult respiratory distress syndrome) ([place])   . COVID-19   . Acute respiratory failure with hypoxia ([place]) 09/09/2018  . Mixed dyslipidemia 07/10/2016  . Osteopenia 07/10/2016  . Vitamin B12 deficiency 07/10/2016  . 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 Aspiration pneumonia ([place]) 04/17/2012  . Gastroenteritis 04/17/2012  . Dehydration  04/17/2012  . Diabetes mellitus ([place]) 04/17/2012  . HTN (hypertension) 04/17/2012  . TIA (transient ischemic attack) 04/17/2012   Past Medical History:  Diagnosis Date  . Chronic kidney disease    stage 3  . Diabetes mellitus without complication ([place])   . Hypercholesteremia   . Hypertension   . Neuromuscular disorder (HCC)    neuropathy  . Osteopenia   . Retinal detachment   . TIA (transient ischemic attack)   . Vitamin B12 deficiency     History reviewed. No pertinent family history.  Past Surgical History:  Procedure Laterality Date  . [place] VITRECTOMY WITH 20 GAUGE MVR PORT Right 03/16/2015   Procedure: 25 GAUGE PARS PLANA VITRECTOMY WITH 23 GAUGE MVR PORT;  Surgeon: Hayden Pedro, MD;  Location: [place];  Service: Ophthalmology;  Laterality: Right;  . ANKLE SURGERY    . BREAST EXCISIONAL BIOPSY    . BREAST LUMPECTOMY WITH RADIOACTIVE SEED LOCALIZATION Right 01/24/2016   Procedure: RIGHT BREAST LUMPECTOMY WITH RADIOACTIVE SEED LOCALIZATION;  Surgeon: Coralie Keens, MD;  Location: [place];  Service: General;  Laterality: Right;  . EYE SURGERY     detached retna X2  . HEEL SPUR SURGERY  2012  . INJECTION OF SILICONE OIL Right [id number]   Procedure: Extraction OF SILICONE OIL;  Surgeon: Hayden Pedro, MD;  Location: [place];  Service: Ophthalmology;  Laterality: Right;  . ORIF ANKLE FRACTURE Left 06/17/2014   Procedure: OPEN REDUCTION INTERNAL FIXATION (ORIF) LEFT BIMALLEOLAR ANKLE FRACTURE;  Surgeon: Marianna Payment, MD;  Location: [place];  Service: Orthopedics;  Laterality: Left;  . PHOTOCOAGULATION WITH LASER Right 03/16/2015   Procedure: PHOTOCOAGULATION WITH LASER;  Surgeon: Hayden Pedro, MD;  Location: [place];  Service: Ophthalmology;  Laterality: Right;  . SHOULDER ARTHROSCOPY Right    plates and screws  . VITRECTOMY Right 03/16/2015  . WRIST ARTHROPLASTY Left    plates and screws and bone graft   Social History   Occupational  History  . Not on file  Tobacco Use  . Smoking status: Never Smoker  . Smokeless tobacco: Never Used  Substance and Sexual Activity  . Alcohol use: No  . Drug use: No  . Sexual activity: Never    Birth control/protection: Post-menopausal

## 2019-04-17 NOTE — Addendum Note (Signed)
Addended by: Hortencia Pilar on: 04/17/2019 09:07 AM   Modules accepted: Orders

## 2019-04-17 NOTE — Telephone Encounter (Signed)
Patient is heading upstairs for a shoulder injection. Mendel Ryder said send message to you.

## 2019-04-17 NOTE — Progress Notes (Signed)
Subjective: Patient is here for ultrasound-guided intra-articular right glenohumeral injection.    Objective:  Decreased overhead reach.  Procedure: Ultrasound-guided right glenohumeral injection: After sterile prep with Betadine, injected 8 cc 1% lidocaine without epinephrine and 40 mg methylprednisolone using a 22-gauge spinal needle, passing the needle through approach into the glenohumeral joint.  Injectate seen filling joint capsule.  Very good immediate relief.  Slight improvement in ROM.  Ten tablets of hydrocodone called in.

## 2019-05-14 ENCOUNTER — Other Ambulatory Visit: Payer: Self-pay

## 2019-05-14 ENCOUNTER — Encounter (INDEPENDENT_AMBULATORY_CARE_PROVIDER_SITE_OTHER): Payer: Medicare Other | Admitting: Ophthalmology

## 2019-05-14 DIAGNOSIS — H35372 Puckering of macula, left eye: Secondary | ICD-10-CM

## 2019-05-14 DIAGNOSIS — E113511 Type 2 diabetes mellitus with proliferative diabetic retinopathy with macular edema, right eye: Secondary | ICD-10-CM | POA: Diagnosis not present

## 2019-05-14 DIAGNOSIS — E11311 Type 2 diabetes mellitus with unspecified diabetic retinopathy with macular edema: Secondary | ICD-10-CM | POA: Diagnosis not present

## 2019-05-14 DIAGNOSIS — H43813 Vitreous degeneration, bilateral: Secondary | ICD-10-CM

## 2019-05-14 DIAGNOSIS — H338 Other retinal detachments: Secondary | ICD-10-CM

## 2019-05-14 DIAGNOSIS — E113312 Type 2 diabetes mellitus with moderate nonproliferative diabetic retinopathy with macular edema, left eye: Secondary | ICD-10-CM

## 2019-05-14 DIAGNOSIS — H35033 Hypertensive retinopathy, bilateral: Secondary | ICD-10-CM

## 2019-05-14 DIAGNOSIS — I10 Essential (primary) hypertension: Secondary | ICD-10-CM | POA: Diagnosis not present

## 2019-06-18 ENCOUNTER — Other Ambulatory Visit: Payer: Self-pay

## 2019-06-18 ENCOUNTER — Encounter (INDEPENDENT_AMBULATORY_CARE_PROVIDER_SITE_OTHER): Payer: Medicare Other | Admitting: Ophthalmology

## 2019-06-18 DIAGNOSIS — E113591 Type 2 diabetes mellitus with proliferative diabetic retinopathy without macular edema, right eye: Secondary | ICD-10-CM

## 2019-06-18 DIAGNOSIS — H35033 Hypertensive retinopathy, bilateral: Secondary | ICD-10-CM

## 2019-06-18 DIAGNOSIS — H338 Other retinal detachments: Secondary | ICD-10-CM

## 2019-06-18 DIAGNOSIS — E113312 Type 2 diabetes mellitus with moderate nonproliferative diabetic retinopathy with macular edema, left eye: Secondary | ICD-10-CM

## 2019-06-18 DIAGNOSIS — E11311 Type 2 diabetes mellitus with unspecified diabetic retinopathy with macular edema: Secondary | ICD-10-CM

## 2019-06-18 DIAGNOSIS — I10 Essential (primary) hypertension: Secondary | ICD-10-CM

## 2019-06-18 DIAGNOSIS — H43813 Vitreous degeneration, bilateral: Secondary | ICD-10-CM

## 2019-06-29 ENCOUNTER — Encounter: Payer: Self-pay | Admitting: Cardiovascular Disease

## 2019-06-29 NOTE — Progress Notes (Signed)
Cardiology Office Note:    Date:  06/30/2019   ID:  DAMYRIA Baker, DOB 1940/12/27, MRN [id number]  PCP:  [name], MD  Cardiologist:  Marjorie Deprey  Electrophysiologist:  None   Referring MD: [name], MD   Chief Complaint  Patient presents with  . Atrial Fibrillation  . Hyperlipidemia    Problem List 1. Paroxysmal atrial fib: CHADS2VASC = 7 ( female, age, TIA, HTN, DM)   2.  HTN 3, Covid 19 infection , April 2020  4.  DM  History of Present Illness:    Sabrina Baker is a 79 y.o. female with a  Recent hospitalization for COVID-19.  She had a prolonged hospitalization involving prolonged intubation.  She had paroxysmal atrial fibrillation.  She was placed on Eliquis at that time.  She was discharged in normal sinus rhythm.  She has been to [facility], [facility], [facility] She developed atrial fib while she was intubated and sick with COVID 19 . Has improved quite a bit since then.   Breathing has improved.  Has an occasional cough .   Her son died in Nov 05, 2022 of this year of CHF.   We discussed this   [name] 18, 2021 Ronnee is seen today for follow up of her PAF, hyperlipidemia, and HTN. Her episode of PAF was related to a COVID infection Exercises some,  Has been doing some walking . Still fatigued from her covid .   Has occasional twinges in her left chest  Reports some swelling of her right leg, goes down at night  Is on eliquis so unlikely to be a DVT . No further arrhythmias to suggest afib .  Is a vegetarian - eats no meat      Past Medical History:  Diagnosis Date  . Chronic kidney disease    stage 3  . Diabetes mellitus without complication ([place])   . Hypercholesteremia   . Hypertension   . Neuromuscular disorder (HCC)    neuropathy  . Osteopenia   . Retinal detachment   . TIA (transient ischemic attack)   . Vitamin B12 deficiency     Past Surgical History:  Procedure Laterality Date  . [place] VITRECTOMY WITH 20 GAUGE  MVR PORT Right 03/16/2015   Procedure: 25 GAUGE PARS PLANA VITRECTOMY WITH 23 GAUGE MVR PORT;  Surgeon: Hayden Pedro, MD;  Location: [place];  Service: Ophthalmology;  Laterality: Right;  . ANKLE SURGERY    . BREAST EXCISIONAL BIOPSY    . BREAST LUMPECTOMY WITH RADIOACTIVE SEED LOCALIZATION Right 01/24/2016   Procedure: RIGHT BREAST LUMPECTOMY WITH RADIOACTIVE SEED LOCALIZATION;  Surgeon: Coralie Keens, MD;  Location: [place];  Service: General;  Laterality: Right;  . EYE SURGERY     detached retna X2  . HEEL SPUR SURGERY  2012  . INJECTION OF SILICONE OIL Right 123456   Procedure: Extraction OF SILICONE OIL;  Surgeon: Hayden Pedro, MD;  Location: [place];  Service: Ophthalmology;  Laterality: Right;  . ORIF ANKLE FRACTURE Left 06/17/2014   Procedure: OPEN REDUCTION INTERNAL FIXATION (ORIF) LEFT BIMALLEOLAR ANKLE FRACTURE;  Surgeon: Marianna Payment, MD;  Location: [place];  Service: Orthopedics;  Laterality: Left;  . PHOTOCOAGULATION WITH LASER Right 03/16/2015   Procedure: PHOTOCOAGULATION WITH LASER;  Surgeon: Hayden Pedro, MD;  Location: [place];  Service: Ophthalmology;  Laterality: Right;  . SHOULDER ARTHROSCOPY Right    plates and screws  . VITRECTOMY Right 03/16/2015  . WRIST ARTHROPLASTY  Left    plates and screws and bone graft    Current Medications: Current Meds  Medication Sig  . acetaminophen (TYLENOL) 160 MG/5ML solution Take 20.3 mLs (650 mg total) by mouth every 4 (four) hours as needed for fever.  Marland Kitchen amLODipine (NORVASC) 10 MG tablet Take 10 mg by mouth daily.  Marland Kitchen apixaban (ELIQUIS) 5 MG TABS tablet Take 1 tablet (5 mg total) by mouth 2 (two) times daily.  Marland Kitchen atorvastatin (LIPITOR) 10 MG tablet Take 10 mg by mouth daily.  . bacitracin-polymyxin b (POLYSPORIN) ointment Apply topically.  . bevacizumab (AVASTIN) 400 MG/16ML SOLN Inject 5 mg/kg into the vein once.  Marland Kitchen CALCIUM-MAGNESIUM-ZINC PO Take by mouth.  . calcium-vitamin D (OSCAL WITH D) 500-200  MG-UNIT TABS tablet Take 1 tablet once a day  . carvedilol (COREG) 25 MG tablet Take 25 mg by mouth 2 (two) times daily with a meal.  . cholecalciferol (VITAMIN D) 1000 UNITS tablet Take 1,000 Units by mouth daily.  . ciprofloxacin (CIPRO) 500 MG tablet Take 500 mg by mouth 2 (two) times daily.  . colchicine 0.6 MG tablet Take 0.6 mg by mouth daily as needed (gout). Started 04/07/13  . [employer] B-12 500 MCG tablet Take 1,000 mcg by mouth daily.  . DULoxetine (CYMBALTA) 60 MG capsule Take by mouth daily.  . feeding supplement, ENSURE ENLIVE, (ENSURE ENLIVE) LIQD Take 237 mLs by mouth daily at 8 pm.  . Ferrous Sulfate (IRON) 325 (65 FE) MG TABS Take 1 tablet by mouth 3 (three) times daily.  Marland Kitchen gabapentin (NEURONTIN) 100 MG capsule Take 100 mg by mouth at bedtime.   Marland Kitchen HYDROcodone-acetaminophen (NORCO/VICODIN) 5-325 MG tablet Take 1 tablet by mouth every 6 (six) hours as needed for moderate pain.  . Insulin Glargine, 2 Unit Dial, (TOUJEO MAX SOLOSTAR) 300 UNIT/ML SOPN Inject 12 Units into the skin daily.  . Insulin Pen Needle (FIFTY50 PEN NEEDLES) 32G X 4 MM MISC USE TO INJECT INSULIN ONCE A DAY  . ketoconazole (NIZORAL) 2 % shampoo USE AS INSTRUCTED EVERY OTHER DAY  . Lancets 30G MISC Use to test blood sugars 3 times a day (Dx: E11.65)  . latanoprost (XALATAN) 0.005 % ophthalmic solution Place 1 drop into the left eye at bedtime.   . Magnesium 500 MG CAPS Take 1 capsule by mouth daily.  . mupirocin ointment (BACTROBAN) 2 % Place 1 application into the nose as needed.  . nateglinide (STARLIX) 120 MG tablet Take 1 tablet before meals 2 times a day for diabetes (hold if blood sugar under 130)  . ONETOUCH VERIO test strip USE AS DIRECTED. TESTING FREQUENCY  3 X/DAILY (DX E11.65)  . pantoprazole (PROTONIX) 40 MG tablet Take 40 mg by mouth 2 (two) times daily.  . pioglitazone (ACTOS) 15 MG tablet TAKE 1 TABLET ONCE A DAY FOR DIABETES  . triamcinolone cream (KENALOG) 0.1 % Apply 1 application topically 2 (two)  times daily.   Marland Kitchen trimethoprim-polymyxin b (POLYTRIM) ophthalmic solution INSTILL ONE DROP INTO LEFT EYE 4 TIMES A DAY FOR 2 DAYS AFTER MONTHLY EYE INJECTION  . TRULICITY 1.5 0000000 SOPN INJECT 1.5 MG INTO THE SKIN ONCE A WEEK.     Allergies:   Altace [ramipril], Cortisone, Diovan [valsartan], Niacin, and Niacin and related   Social History   Socioeconomic History  . Marital status: Divorced    Spouse name: Not on file  . Number of children: Not on file  . Years of education: Not on file  . Highest education level: Not  on file  Occupational History  . Not on file  Tobacco Use  . Smoking status: Never Smoker  . Smokeless tobacco: Never Used  Substance and Sexual Activity  . Alcohol use: No  . Drug use: No  . Sexual activity: Never    Birth control/protection: Post-menopausal  Other Topics Concern  . Not on file  Social History Narrative  . Not on file   Social Determinants of Health   Financial Resource Strain:   . Difficulty of Paying Living Expenses: Not on file  Food Insecurity:   . Worried About Charity fundraiser in the Last Year: Not on file  . Ran Out of Food in the Last Year: Not on file  Transportation Needs:   . Lack of Transportation (Medical): Not on file  . Lack of Transportation (Non-Medical): Not on file  Physical Activity:   . Days of Exercise per Week: Not on file  . Minutes of Exercise per Session: Not on file  Stress:   . Feeling of Stress : Not on file  Social Connections:   . Frequency of Communication with Friends and Family: Not on file  . Frequency of Social Gatherings with Friends and Family: Not on file  . Attends Religious Services: Not on file  . Active Member of Clubs or Organizations: Not on file  . Attends Archivist Meetings: Not on file  . Marital Status: Not on file     Family History: The patient's family history is not on file.  ROS:   Please see the history of present illness.     All other systems reviewed and  are negative.  EKGs/Labs/Other Studies Reviewed:    The following studies were reviewed today:   EKG:   Recent Labs: 09/25/2018: B Natriuretic Peptide 278.8 09/27/2018: TSH 0.551 10/03/2018: ALT 22 10/08/2018: Hemoglobin 8.6; Platelets 346 10/10/2018: BUN 19; Creatinine, Ser 0.98; Magnesium 2.0; Potassium 4.2; Sodium 139  Recent Lipid Panel    Component Value Date/Time   CHOL 134 04/18/2012 0540   TRIG 76 09/22/2018 0254   HDL 58 04/18/2012 0540   CHOLHDL 2.3 04/18/2012 0540   VLDL 14 04/18/2012 0540   LDLCALC 62 04/18/2012 0540    Physical Exam:     Physical Exam: Blood pressure 140/70, pulse 72, height 5\' 3"  (1.6 m), weight 173 lb 12.8 oz (78.8 kg), SpO2 98 %.  GEN:  Elderly female,  NAD   HEENT: Normal NECK: No JVD; No carotid bruits LYMPHATICS: No lymphadenopathy CARDIAC: RRR , no murmurs, rubs, gallops RESPIRATORY:  Clear to auscultation without rales, wheezing or rhonchi  ABDOMEN: Soft, non-tender, non-distended MUSCULOSKELETAL:  No edema; No deformity  SKIN: Warm and dry NEUROLOGIC:  Alert and oriented x 3   ECG: June 30, 2019: Normal sinus rhythm at 72.  No ST or T wave changes.  ASSESSMENT:    1. PAF (paroxysmal atrial fibrillation) ([place])   2. Essential hypertension   3. Mitral valve insufficiency, unspecified etiology    PLAN:    In order of problems listed above:  PAF:   CHADS2VASC is 29 ( female, age, TIA, HTN, DM ).     Stable, she has not had any  further episodes of AF    2. HTN:   Advised more exercise and continued salt restriction  3.  Hyperlipidema:  Plans and management per Dr. Dema Severin   Will have her see my APP in 1 year     Medication Adjustments/Labs and Tests Ordered: Current medicines are  reviewed at length with the patient today.  Concerns regarding medicines are outlined above.  Orders Placed This Encounter  Procedures  . EKG 12-Lead   No orders of the defined types were placed in this encounter.   Patient Instructions   Medication Instructions:  Your physician recommends that you continue on your current medications as directed. Please refer to the Current Medication list given to you today.  *If you need a refill on your cardiac medications before your next appointment, please call your pharmacy*  Lab Work: None Ordered If you have labs (blood work) drawn today and your tests are completely normal, you will receive your results only by: Marland Kitchen MyChart Message (if you have MyChart) OR . A paper copy in the mail If you have any lab test that is abnormal or we need to change your treatment, we will call you to review the results.   Testing/Procedures: None Ordered   Follow-Up: At [facility], you and your health needs are our priority.  As part of our continuing mission to provide you with exceptional heart care, we have created designated Provider Care Teams.  These Care Teams include your primary Cardiologist (physician) and Advanced Practice Providers (APPs -  Physician Assistants and Nurse Practitioners) who all work together to provide you with the care you need, when you need it.  Your next appointment:   1 year(s)  The format for your next appointment:   In Person  Provider:   Richardson Dopp, PA-C, Robbie Lis, PA-C or Daune Perch, NP       Signed, Mertie Moores, MD  06/30/2019 5:58 PM    [place]

## 2019-06-30 ENCOUNTER — Encounter: Payer: Self-pay | Admitting: Podiatry

## 2019-06-30 ENCOUNTER — Encounter: Payer: Self-pay | Admitting: Cardiovascular Disease

## 2019-06-30 ENCOUNTER — Ambulatory Visit: Payer: Medicare Other | Admitting: Cardiovascular Disease

## 2019-06-30 ENCOUNTER — Ambulatory Visit (INDEPENDENT_AMBULATORY_CARE_PROVIDER_SITE_OTHER): Payer: Medicare Other | Admitting: Podiatry

## 2019-06-30 ENCOUNTER — Other Ambulatory Visit: Payer: Self-pay

## 2019-06-30 VITALS — BP 140/70 | HR 72 | Ht 63.0 in | Wt 173.8 lb

## 2019-06-30 DIAGNOSIS — B351 Tinea unguium: Secondary | ICD-10-CM

## 2019-06-30 DIAGNOSIS — M79675 Pain in left toe(s): Secondary | ICD-10-CM

## 2019-06-30 DIAGNOSIS — E1142 Type 2 diabetes mellitus with diabetic polyneuropathy: Secondary | ICD-10-CM | POA: Diagnosis not present

## 2019-06-30 DIAGNOSIS — E11610 Type 2 diabetes mellitus with diabetic neuropathic arthropathy: Secondary | ICD-10-CM

## 2019-06-30 DIAGNOSIS — I340 Nonrheumatic mitral (valve) insufficiency: Secondary | ICD-10-CM | POA: Diagnosis not present

## 2019-06-30 DIAGNOSIS — M79674 Pain in right toe(s): Secondary | ICD-10-CM

## 2019-06-30 DIAGNOSIS — I480 Paroxysmal atrial fibrillation: Secondary | ICD-10-CM | POA: Diagnosis not present

## 2019-06-30 DIAGNOSIS — I10 Essential (primary) hypertension: Secondary | ICD-10-CM | POA: Diagnosis not present

## 2019-06-30 NOTE — Patient Instructions (Signed)
Medication Instructions:  Your physician recommends that you continue on your current medications as directed. Please refer to the Current Medication list given to you today.  *If you need a refill on your cardiac medications before your next appointment, please call your pharmacy*  Lab Work: None Ordered If you have labs (blood work) drawn today and your tests are completely normal, you will receive your results only by: Marland Kitchen MyChart Message (if you have MyChart) OR . A paper copy in the mail If you have any lab test that is abnormal or we need to change your treatment, we will call you to review the results.   Testing/Procedures: None Ordered   Follow-Up: At [facility], you and your health needs are our priority.  As part of our continuing mission to provide you with exceptional heart care, we have created designated Provider Care Teams.  These Care Teams include your primary Cardiologist (physician) and Advanced Practice Providers (APPs -  Physician Assistants and Nurse Practitioners) who all work together to provide you with the care you need, when you need it.  Your next appointment:   1 year(s)  The format for your next appointment:   In Person  Provider:   Richardson Dopp, PA-C, Robbie Lis, PA-C or Daune Perch, NP

## 2019-06-30 NOTE — Patient Instructions (Signed)

## 2019-07-05 NOTE — Progress Notes (Signed)
Subjective: Sabrina Baker presents today for follow up of preventative diabetic foot care: and painful mycotic nails b/l that are difficult to trim. Pain interferes with ambulation. Aggravating factors include wearing enclosed shoe gear. Pain is relieved with periodic professional debridement.   Allergies  Allergen Reactions  . Altace [Ramipril] Anaphylaxis  . Cortisone Itching and Rash  . Diovan [Valsartan] Other (See Comments)    Hyperkalemia   . Niacin Diarrhea    And nausea  . Niacin And Related Diarrhea     Objective: There were no vitals filed for this visit.  Vascular Examination:  capillary fill time to digits <3s b/l, faintly palpable pedal pulses b/l, pedal hair absent b/l and skin temperature gradient within normal limits b/l  Dermatological Examination: Pedal skin is thin shiny, atrophic bilaterally., No open wounds bilaterally. and No interdigital macerations bilaterally.  onychomycosis of the toenails 1-5 b/l which exhibit, elongation, dystrophy, discoloration, thickening, subungual debris, pain to palpation of nail beds and subungual hematoma  Musculoskeletal: normal muscle strength 5/5 to all lower extremity muscle groups bilaterally, no pain crepitus or joint limitation noted with ROM, hammertoes noted to the 2nd toe, 3rd toe, 4th toe, 5th toe, bilaterally and Charcot deformity with rocker bottom foot  Neurological: sensation absent with 10g monofilament  Assessment: 1. Pain due to onychomycosis of toenails of both feet   2. Diabetic Charcot foot ([place])   3. Diabetic peripheral neuropathy associated with type 2 diabetes mellitus ([place])      Plan: Continue diabetic foot care principles. Literature dispensed on today.  -Toenails 1-5 b/l were debrided in length and girth without iatrogenic bleeding. -Patient to continue soft, supportive shoe gear daily. Start procedure for diabetic shoes. Patient qualifies based on diagnoses -Patient to report any pedal injuries  to medical professional immediately. -Patient/POA to call should there be question/concern in the interim.  Return in about 3 months (around 09/28/2019).

## 2019-07-15 ENCOUNTER — Ambulatory Visit (INDEPENDENT_AMBULATORY_CARE_PROVIDER_SITE_OTHER): Payer: Medicare Other | Admitting: Orthopaedic Surgery

## 2019-07-15 ENCOUNTER — Ambulatory Visit: Payer: Self-pay

## 2019-07-15 ENCOUNTER — Encounter: Payer: Self-pay | Admitting: Orthopaedic Surgery

## 2019-07-15 ENCOUNTER — Other Ambulatory Visit: Payer: Self-pay

## 2019-07-15 DIAGNOSIS — M25551 Pain in right hip: Secondary | ICD-10-CM

## 2019-07-15 DIAGNOSIS — M5412 Radiculopathy, cervical region: Secondary | ICD-10-CM

## 2019-07-15 DIAGNOSIS — M25511 Pain in right shoulder: Secondary | ICD-10-CM | POA: Diagnosis not present

## 2019-07-15 DIAGNOSIS — G8929 Other chronic pain: Secondary | ICD-10-CM

## 2019-07-15 NOTE — Progress Notes (Signed)
Office Visit Note   Patient: Sabrina Baker           Date of Birth: [birth year]           MRN: [id number] Visit Date: 07/15/2019              Requested by: [name], MD [place],  [place] [id number] PCP: [name], MD   Assessment & Plan: Visit Diagnoses:  1. Pain in right hip   2. Chronic right shoulder pain   3. Radiculopathy of cervical spine     Plan: impression is right shoulder osteonecrosis, right upper extremity radiculopathy, chronic lower back pain with lower extremity radiculopathy and right hip osteonecrosis.  Due to patients underlying cortisone allergy, we will avoid steroid injection or steroid pack.  We will start her in physical therapy instead.  She will follow up with [place] as needed.    Follow-Up Instructions: Return if symptoms worsen or fail to improve.   Orders:  Orders Placed This Encounter  Procedures  . XR Pelvis 1-2 Views  . XR Lumbar Spine 2-3 Views   No orders of the defined types were placed in this encounter.     Procedures: No procedures performed   Clinical Data: No additional findings.   Subjective: Chief Complaint  Patient presents with  . Right Shoulder - Pain  . Left Leg - Pain  . Right Hip - Pain    GROIN PAIN   . Lower Back - Pain    HPI patient is a pleasant 79 year old female who comes in today with recurrent right shoulder and arm pain and new onset bilateral lower back and right groin pain.  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  We referred her to Dr. Junius Roads on 04/17/2019 where right shoulder glenohumeral cortisone injection was performed.  She had significant relief of symptoms but was not long-lasting.  She also noticed soon after the injection that she broke out in hives.  She was seen by her PCP for this where she was told not to have any more cortisone.  Other issue she brings up today is  bilateral lower back pain and right groin pain.  She has had pain and catching to the groin which is aggravated when turning over in bed as well as with ambulation.  She notes decreased sensation to the anterior thigh.  She has been taking over-the-counter pain medicine as well as an occasional Norco with mild relief of symptoms.  No previous right hip injection.  No bowel or bladder change and no saddle paresthesias.  Review of Systems as detailed in HPI.  All others reviewed and are negative.   Objective: Vital Signs: There were no vitals taken for this visit.  Physical Exam well-developed well-nourished female no acute distress.  Alert and oriented x3.  Ortho Exam examination of her lumbar spine reveals mild paraspinous tenderness to both sides.  No spinous tenderness.  No pain with lumbar flexion or extension.  She does have a positive straight leg raise.  Negative logroll and negative FADIR.  No focal weakness.  She is neurovascularly intact distally.  Specialty Comments:  No specialty comments available.  Imaging: XR Lumbar Spine 2-3 Views  Result Date: 07/15/2019 Retrolisthesis L5.  Moderate spondylosis throughout  XR Pelvis 1-2 Views  Result Date: 07/15/2019 Moderate joint space narrowing    PMFS History: Patient Active Problem  List   Diagnosis Date Noted  . Atrial fibrillation ([place]) 12/23/2018  . ARDS (adult respiratory distress syndrome) ([place])   . COVID-19   . Acute respiratory failure with hypoxia ([place]) 09/09/2018  . Mixed dyslipidemia 07/10/2016  . Osteopenia 07/10/2016  . Vitamin B12 deficiency 07/10/2016  . 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 Aspiration pneumonia ([place]) 04/17/2012  . Gastroenteritis 04/17/2012  . Dehydration 04/17/2012  . Diabetes mellitus ([place]) 04/17/2012  . HTN (hypertension) 04/17/2012  . TIA (transient ischemic attack) 04/17/2012   Past Medical History:  Diagnosis Date  . Chronic kidney disease    stage 3  .  Diabetes mellitus without complication ([place])   . Hypercholesteremia   . Hypertension   . Neuromuscular disorder (HCC)    neuropathy  . Osteopenia   . Retinal detachment   . TIA (transient ischemic attack)   . Vitamin B12 deficiency     History reviewed. No pertinent family history.  Past Surgical History:  Procedure Laterality Date  . [place] VITRECTOMY WITH 20 GAUGE MVR PORT Right 03/16/2015   Procedure: 25 GAUGE PARS PLANA VITRECTOMY WITH 23 GAUGE MVR PORT;  Surgeon: Hayden Pedro, MD;  Location: [place];  Service: Ophthalmology;  Laterality: Right;  . ANKLE SURGERY    . BREAST EXCISIONAL BIOPSY    . BREAST LUMPECTOMY WITH RADIOACTIVE SEED LOCALIZATION Right 01/24/2016   Procedure: RIGHT BREAST LUMPECTOMY WITH RADIOACTIVE SEED LOCALIZATION;  Surgeon: Coralie Keens, MD;  Location: [place];  Service: General;  Laterality: Right;  . EYE SURGERY     detached retna X2  . HEEL SPUR SURGERY  2012  . INJECTION OF SILICONE OIL Right 123456   Procedure: Extraction OF SILICONE OIL;  Surgeon: Hayden Pedro, MD;  Location: [place];  Service: Ophthalmology;  Laterality: Right;  . ORIF ANKLE FRACTURE Left 06/17/2014   Procedure: OPEN REDUCTION INTERNAL FIXATION (ORIF) LEFT BIMALLEOLAR ANKLE FRACTURE;  Surgeon: Marianna Payment, MD;  Location: [place];  Service: Orthopedics;  Laterality: Left;  . PHOTOCOAGULATION WITH LASER Right 03/16/2015   Procedure: PHOTOCOAGULATION WITH LASER;  Surgeon: Hayden Pedro, MD;  Location: [place];  Service: Ophthalmology;  Laterality: Right;  . SHOULDER ARTHROSCOPY Right    plates and screws  . VITRECTOMY Right 03/16/2015  . WRIST ARTHROPLASTY Left    plates and screws and bone graft   Social History   Occupational History  . Not on file  Tobacco Use  . Smoking status: Never Smoker  . Smokeless tobacco: Never Used  Substance and Sexual Activity  . Alcohol use: No  . Drug use: No  . Sexual activity: Never    Birth  control/protection: Post-menopausal

## 2019-07-16 ENCOUNTER — Encounter (INDEPENDENT_AMBULATORY_CARE_PROVIDER_SITE_OTHER): Payer: Medicare Other | Admitting: Ophthalmology

## 2019-07-16 DIAGNOSIS — I10 Essential (primary) hypertension: Secondary | ICD-10-CM

## 2019-07-16 DIAGNOSIS — E113312 Type 2 diabetes mellitus with moderate nonproliferative diabetic retinopathy with macular edema, left eye: Secondary | ICD-10-CM | POA: Diagnosis not present

## 2019-07-16 DIAGNOSIS — E11311 Type 2 diabetes mellitus with unspecified diabetic retinopathy with macular edema: Secondary | ICD-10-CM | POA: Diagnosis not present

## 2019-07-16 DIAGNOSIS — H35033 Hypertensive retinopathy, bilateral: Secondary | ICD-10-CM

## 2019-07-16 DIAGNOSIS — H43813 Vitreous degeneration, bilateral: Secondary | ICD-10-CM

## 2019-07-16 DIAGNOSIS — E113591 Type 2 diabetes mellitus with proliferative diabetic retinopathy without macular edema, right eye: Secondary | ICD-10-CM | POA: Diagnosis not present

## 2019-07-22 ENCOUNTER — Ambulatory Visit: Payer: Medicare Other | Admitting: Orthotics

## 2019-07-22 ENCOUNTER — Other Ambulatory Visit: Payer: Self-pay

## 2019-07-22 DIAGNOSIS — E11610 Type 2 diabetes mellitus with diabetic neuropathic arthropathy: Secondary | ICD-10-CM

## 2019-07-22 DIAGNOSIS — E119 Type 2 diabetes mellitus without complications: Secondary | ICD-10-CM

## 2019-07-22 DIAGNOSIS — M2142 Flat foot [pes planus] (acquired), left foot: Secondary | ICD-10-CM

## 2019-07-22 DIAGNOSIS — E1142 Type 2 diabetes mellitus with diabetic polyneuropathy: Secondary | ICD-10-CM

## 2019-07-22 DIAGNOSIS — M2141 Flat foot [pes planus] (acquired), right foot: Secondary | ICD-10-CM

## 2019-07-22 DIAGNOSIS — M79675 Pain in left toe(s): Secondary | ICD-10-CM

## 2019-07-22 DIAGNOSIS — B351 Tinea unguium: Secondary | ICD-10-CM

## 2019-07-22 NOTE — Progress Notes (Signed)
Docs only order for Sabrina Baker as she is getting f/o from [place] (diabetic 3 pair); these seem to have worked best for her in past.    She chose apex shoe.

## 2019-07-30 ENCOUNTER — Other Ambulatory Visit: Payer: Medicare Other | Admitting: Orthotics

## 2019-07-30 ENCOUNTER — Other Ambulatory Visit: Payer: Self-pay

## 2019-08-13 ENCOUNTER — Encounter (INDEPENDENT_AMBULATORY_CARE_PROVIDER_SITE_OTHER): Payer: Medicare Other | Admitting: Ophthalmology

## 2019-08-13 DIAGNOSIS — H43813 Vitreous degeneration, bilateral: Secondary | ICD-10-CM

## 2019-08-13 DIAGNOSIS — I10 Essential (primary) hypertension: Secondary | ICD-10-CM

## 2019-08-13 DIAGNOSIS — E113511 Type 2 diabetes mellitus with proliferative diabetic retinopathy with macular edema, right eye: Secondary | ICD-10-CM | POA: Diagnosis not present

## 2019-08-13 DIAGNOSIS — E11311 Type 2 diabetes mellitus with unspecified diabetic retinopathy with macular edema: Secondary | ICD-10-CM | POA: Diagnosis not present

## 2019-08-13 DIAGNOSIS — H35373 Puckering of macula, bilateral: Secondary | ICD-10-CM

## 2019-08-13 DIAGNOSIS — H338 Other retinal detachments: Secondary | ICD-10-CM

## 2019-08-13 DIAGNOSIS — E113312 Type 2 diabetes mellitus with moderate nonproliferative diabetic retinopathy with macular edema, left eye: Secondary | ICD-10-CM

## 2019-08-13 DIAGNOSIS — H35033 Hypertensive retinopathy, bilateral: Secondary | ICD-10-CM

## 2019-09-15 ENCOUNTER — Telehealth: Payer: Self-pay | Admitting: Podiatry

## 2019-09-15 NOTE — Telephone Encounter (Signed)
Pt called checking on status of diabetic shoes inserts.   I explained we have not received  Signed paperwork from doctor. She is calling them to see what is going on.Marland KitchenMarland Kitchen

## 2019-09-17 ENCOUNTER — Other Ambulatory Visit: Payer: Self-pay

## 2019-09-17 ENCOUNTER — Encounter (INDEPENDENT_AMBULATORY_CARE_PROVIDER_SITE_OTHER): Payer: Medicare Other | Admitting: Ophthalmology

## 2019-09-17 DIAGNOSIS — E113312 Type 2 diabetes mellitus with moderate nonproliferative diabetic retinopathy with macular edema, left eye: Secondary | ICD-10-CM | POA: Diagnosis not present

## 2019-09-17 DIAGNOSIS — E11311 Type 2 diabetes mellitus with unspecified diabetic retinopathy with macular edema: Secondary | ICD-10-CM | POA: Diagnosis not present

## 2019-09-17 DIAGNOSIS — H33302 Unspecified retinal break, left eye: Secondary | ICD-10-CM

## 2019-09-17 DIAGNOSIS — H338 Other retinal detachments: Secondary | ICD-10-CM

## 2019-09-17 DIAGNOSIS — H35033 Hypertensive retinopathy, bilateral: Secondary | ICD-10-CM

## 2019-09-17 DIAGNOSIS — I10 Essential (primary) hypertension: Secondary | ICD-10-CM

## 2019-09-17 DIAGNOSIS — H43813 Vitreous degeneration, bilateral: Secondary | ICD-10-CM

## 2019-09-17 DIAGNOSIS — E113511 Type 2 diabetes mellitus with proliferative diabetic retinopathy with macular edema, right eye: Secondary | ICD-10-CM | POA: Diagnosis not present

## 2019-10-03 ENCOUNTER — Other Ambulatory Visit: Payer: Self-pay

## 2019-10-03 ENCOUNTER — Ambulatory Visit (INDEPENDENT_AMBULATORY_CARE_PROVIDER_SITE_OTHER): Payer: Medicare Other | Admitting: Podiatry

## 2019-10-03 ENCOUNTER — Encounter: Payer: Self-pay | Admitting: Podiatry

## 2019-10-03 VITALS — Temp 97.3°F

## 2019-10-03 DIAGNOSIS — B351 Tinea unguium: Secondary | ICD-10-CM | POA: Diagnosis not present

## 2019-10-03 DIAGNOSIS — M79674 Pain in right toe(s): Secondary | ICD-10-CM | POA: Diagnosis not present

## 2019-10-03 DIAGNOSIS — M2042 Other hammer toe(s) (acquired), left foot: Secondary | ICD-10-CM

## 2019-10-03 DIAGNOSIS — M79675 Pain in left toe(s): Secondary | ICD-10-CM | POA: Diagnosis not present

## 2019-10-03 DIAGNOSIS — M2041 Other hammer toe(s) (acquired), right foot: Secondary | ICD-10-CM

## 2019-10-03 DIAGNOSIS — E1142 Type 2 diabetes mellitus with diabetic polyneuropathy: Secondary | ICD-10-CM

## 2019-10-03 DIAGNOSIS — M14672 Charcot's joint, left ankle and foot: Secondary | ICD-10-CM

## 2019-10-03 NOTE — Patient Instructions (Signed)
Diabetes Mellitus and Foot Care Foot care is an important part of your health, especially when you have diabetes. Diabetes may cause you to have problems because of poor blood flow (circulation) to your feet and legs, which can cause your skin to:  Become thinner and drier.  Break more easily.  Heal more slowly.  Peel and crack. You may also have nerve damage (neuropathy) in your legs and feet, causing decreased feeling in them. This means that you may not notice minor injuries to your feet that could lead to more serious problems. Noticing and addressing any potential problems early is the best way to prevent future foot problems. How to care for your feet Foot hygiene  Wash your feet daily with warm water and mild soap. Do not use hot water. Then, pat your feet and the areas between your toes until they are completely dry. Do not soak your feet as this can dry your skin.  Trim your toenails straight across. Do not dig under them or around the cuticle. File the edges of your nails with an emery board or nail file.  Apply a moisturizing lotion or petroleum jelly to the skin on your feet and to dry, brittle toenails. Use lotion that does not contain alcohol and is unscented. Do not apply lotion between your toes. Shoes and socks  Wear clean socks or stockings every day. Make sure they are not too tight. Do not wear knee-high stockings since they may decrease blood flow to your legs.  Wear shoes that fit properly and have enough cushioning. Always look in your shoes before you put them on to be sure there are no objects inside.  To break in new shoes, wear them for just a few hours a day. This prevents injuries on your feet. Wounds, scrapes, corns, and calluses  Check your feet daily for blisters, cuts, bruises, sores, and redness. If you cannot see the bottom of your feet, use a mirror or ask someone for help.  Do not cut corns or calluses or try to remove them with medicine.  If you  find a minor scrape, cut, or break in the skin on your feet, keep it and the skin around it clean and dry. You may clean these areas with mild soap and water. Do not clean the area with peroxide, alcohol, or iodine.  If you have a wound, scrape, corn, or callus on your foot, look at it several times a day to make sure it is healing and not infected. Check for: ? Redness, swelling, or pain. ? Fluid or blood. ? Warmth. ? Pus or a bad smell. General instructions  Do not cross your legs. This may decrease blood flow to your feet.  Do not use heating pads or hot water bottles on your feet. They may burn your skin. If you have lost feeling in your feet or legs, you may not know this is happening until it is too late.  Protect your feet from hot and cold by wearing shoes, such as at the beach or on hot pavement.  Schedule a complete foot exam at least once a year (annually) or more often if you have foot problems. If you have foot problems, report any cuts, sores, or bruises to your health care provider immediately. Contact a health care provider if:  You have a medical condition that increases your risk of infection and you have any cuts, sores, or bruises on your feet.  You have an injury that is not   healing.  You have redness on your legs or feet.  You feel burning or tingling in your legs or feet.  You have pain or cramps in your legs and feet.  Your legs or feet are numb.  Your feet always feel cold.  You have pain around a toenail. Get help right away if:  You have a wound, scrape, corn, or callus on your foot and: ? You have pain, swelling, or redness that gets worse. ? You have fluid or blood coming from the wound, scrape, corn, or callus. ? Your wound, scrape, corn, or callus feels warm to the touch. ? You have pus or a bad smell coming from the wound, scrape, corn, or callus. ? You have a fever. ? You have a red line going up your leg. Summary  Check your feet every day  for cuts, sores, red spots, swelling, and blisters.  Moisturize feet and legs daily.  Wear shoes that fit properly and have enough cushioning.  If you have foot problems, report any cuts, sores, or bruises to your health care provider immediately.  Schedule a complete foot exam at least once a year (annually) or more often if you have foot problems. This information is not intended to replace advice given to you by your health care provider. Make sure you discuss any questions you have with your health care provider. Document Revised: 02/19/2019 Document Reviewed: 06/30/2016 Elsevier Patient Education  2020 [employer]  Peripheral Neuropathy Peripheral neuropathy is a type of nerve damage. It affects nerves that carry signals between the spinal cord and the arms, legs, and the rest of the body (peripheral nerves). It does not affect nerves in the spinal cord or brain. In peripheral neuropathy, one nerve or a group of nerves may be damaged. Peripheral neuropathy is a broad category that includes many specific nerve disorders, like diabetic neuropathy, hereditary neuropathy, and carpal tunnel syndrome. What are the causes? This condition may be caused by:  Diabetes. This is the most common cause of peripheral neuropathy.  Nerve injury.  Pressure or stress on a nerve that lasts a long time.  Lack (deficiency) of B vitamins. This can result from alcoholism, poor diet, or a restricted diet.  Infections.  Autoimmune diseases, such as rheumatoid arthritis and systemic lupus erythematosus.  Nerve diseases that are passed from parent to child (inherited).  Some medicines, such as cancer medicines (chemotherapy).  Poisonous (toxic) substances, such as lead and mercury.  Too little blood flowing to the legs.  Kidney disease.  Thyroid disease. In some cases, the cause of this condition is not known. What are the signs or symptoms? Symptoms of this condition depend on which of your  nerves is damaged. Common symptoms include:  Loss of feeling (numbness) in the feet, hands, or both.  Tingling in the feet, hands, or both.  Burning pain.  Very sensitive skin.  Weakness.  Not being able to move a part of the body (paralysis).  Muscle twitching.  Clumsiness or poor coordination.  Loss of balance.  Not being able to control your bladder.  Feeling dizzy.  Sexual problems. How is this diagnosed? Diagnosing and finding the cause of peripheral neuropathy can be difficult. Your health care provider will take your medical history and do a physical exam. A neurological exam will also be done. This involves checking things that are affected by your brain, spinal cord, and nerves (nervous system). For example, your health care provider will check your reflexes, how you move, and   what you can feel. You may have other tests, such as:  Blood tests.  Electromyogram (EMG) and nerve conduction tests. These tests check nerve function and how well the nerves are controlling the muscles.  Imaging tests, such as CT scans or MRI to rule out other causes of your symptoms.  Removing a small piece of nerve to be examined in a lab (nerve biopsy). This is rare.  Removing and examining a small amount of the fluid that surrounds the brain and spinal cord (lumbar puncture). This is rare. How is this treated? Treatment for this condition may involve:  Treating the underlying cause of the neuropathy, such as diabetes, kidney disease, or vitamin deficiencies.  Stopping medicines that can cause neuropathy, such as chemotherapy.  Medicine to relieve pain. Medicines may include: ? Prescription or over-the-counter pain medicine. ? Antiseizure medicine. ? Antidepressants. ? Pain-relieving patches that are applied to painful areas of skin.  Surgery to relieve pressure on a nerve or to destroy a nerve that is causing pain.  Physical therapy to help improve movement and  balance.  Devices to help you move around (assistive devices). Follow these instructions at home: Medicines  Take over-the-counter and prescription medicines only as told by your health care provider. Do not take any other medicines without first asking your health care provider.  Do not drive or use heavy machinery while taking prescription pain medicine. Lifestyle   Do not use any products that contain nicotine or tobacco, such as cigarettes and e-cigarettes. Smoking keeps blood from reaching damaged nerves. If you need help quitting, ask your health care provider.  Avoid or limit alcohol. Too much alcohol can cause a vitamin B deficiency, and vitamin B is needed for healthy nerves.  Eat a healthy diet. This includes: ? Eating foods that are high in fiber, such as fresh fruits and vegetables, whole grains, and beans. ? Limiting foods that are high in fat and processed sugars, such as fried or sweet foods. General instructions   If you have diabetes, work closely with your health care provider to keep your blood sugar under control.  If you have numbness in your feet: ? Check every day for signs of injury or infection. Watch for redness, warmth, and swelling. ? Wear padded socks and comfortable shoes. These help protect your feet.  Develop a good support system. Living with peripheral neuropathy can be stressful. Consider talking with a mental health specialist or joining a support group.  Use assistive devices and attend physical therapy as told by your health care provider. This may include using a walker or a cane.  Keep all follow-up visits as told by your health care provider. This is important. Contact a health care provider if:  You have new signs or symptoms of peripheral neuropathy.  You are struggling emotionally from dealing with peripheral neuropathy.  Your pain is not well-controlled. Get help right away if:  You have an injury or infection that is not healing  normally.  You develop new weakness in an arm or leg.  You fall frequently. Summary  Peripheral neuropathy is when the nerves in the arms, or legs are damaged, resulting in numbness, weakness, or pain.  There are many causes of peripheral neuropathy, including diabetes, pinched nerves, vitamin deficiencies, autoimmune disease, and hereditary conditions.  Diagnosing and finding the cause of peripheral neuropathy can be difficult. Your health care provider will take your medical history, do a physical exam, and do tests, including blood tests and nerve function tests.    Treatment involves treating the underlying cause of the neuropathy and taking medicines to help control pain. Physical therapy and assistive devices may also help. This information is not intended to replace advice given to you by your health care provider. Make sure you discuss any questions you have with your health care provider. Document Revised: 05/11/2017 Document Reviewed: 08/07/2016 Elsevier Patient Education  2020 [employer]  

## 2019-10-09 NOTE — Progress Notes (Signed)
Subjective: Sabrina Baker presents today for follow up of at risk foot care. Pt has h/o NIDDM with chronic kidney disease and painful mycotic nails b/l that are difficult to trim. Pain interferes with ambulation. Aggravating factors include wearing enclosed shoe gear. Pain is relieved with periodic professional debridement.   She is inquiring about status of her diabetic shoes.   She voices no new pedal concerns on today's visit.  Allergies  Allergen Reactions  . Altace [Ramipril] Anaphylaxis  . Cortisone Itching and Rash  . Diovan [Valsartan] Other (See Comments)    Hyperkalemia   . Niacin Diarrhea    And nausea  . Niacin And Related Diarrhea     Objective: Vitals:   10/03/19 0819  Temp: (!) 97.3 F (36.3 C)    Pt 79 y.o. year old female  in NAD. AAO x 3.   Vascular Examination:  Capillary fill time to digits <3 seconds b/l. Palpable DP pulses b/l. Faintly palpable PT pulses b/l. Pedal hair absent b/l Skin temperature gradient within normal limits b/l. No edema noted b/l.  Dermatological Examination: Pedal skin is thin shiny, atrophic bilaterally. No open wounds bilaterally. No interdigital macerations bilaterally. Toenails 1-5 b/l elongated, dystrophic, thickened, crumbly with subungual debris and tenderness to dorsal palpation.  Musculoskeletal: Normal muscle strength 5/5 to all lower extremity muscle groups bilaterally. No pain crepitus or joint limitation noted with ROM b/l. 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  Neurological: Protective sensation diminished with 10g monofilament b/l.  Assessment: 1. Pain due to onychomycosis of toenails of both feet   2. Diabetic peripheral neuropathy associated with type 2 diabetes mellitus ([place])    Plan: -Continue diabetic foot care principles. Literature dispensed on today.  -Toenails 1-5 b/l were debrided in length and girth with sterile nail nippers and dremel without iatrogenic bleeding.   -Patient to continue soft, supportive shoe gear daily. -Patient to report any pedal injuries to medical professional immediately. -Patient directed to Orthotics and Prosthetics Dept for status of her diabetic shoes. -Patient/POA to call should there be question/concern in the interim.  Return in about 3 months (around 01/02/2020) for diabetic nail trim.

## 2019-10-22 ENCOUNTER — Encounter (INDEPENDENT_AMBULATORY_CARE_PROVIDER_SITE_OTHER): Payer: Medicare Other | Admitting: Ophthalmology

## 2019-10-22 ENCOUNTER — Other Ambulatory Visit: Payer: Self-pay

## 2019-10-22 DIAGNOSIS — I10 Essential (primary) hypertension: Secondary | ICD-10-CM | POA: Diagnosis not present

## 2019-10-22 DIAGNOSIS — E113312 Type 2 diabetes mellitus with moderate nonproliferative diabetic retinopathy with macular edema, left eye: Secondary | ICD-10-CM | POA: Diagnosis not present

## 2019-10-22 DIAGNOSIS — H338 Other retinal detachments: Secondary | ICD-10-CM

## 2019-10-22 DIAGNOSIS — E113511 Type 2 diabetes mellitus with proliferative diabetic retinopathy with macular edema, right eye: Secondary | ICD-10-CM | POA: Diagnosis not present

## 2019-10-22 DIAGNOSIS — E11311 Type 2 diabetes mellitus with unspecified diabetic retinopathy with macular edema: Secondary | ICD-10-CM

## 2019-10-22 DIAGNOSIS — H35033 Hypertensive retinopathy, bilateral: Secondary | ICD-10-CM

## 2019-10-22 DIAGNOSIS — H33302 Unspecified retinal break, left eye: Secondary | ICD-10-CM

## 2019-10-22 DIAGNOSIS — H35373 Puckering of macula, bilateral: Secondary | ICD-10-CM

## 2019-10-22 DIAGNOSIS — H43813 Vitreous degeneration, bilateral: Secondary | ICD-10-CM

## 2019-11-19 ENCOUNTER — Inpatient Hospital Stay (HOSPITAL_COMMUNITY)
Admission: EM | Admit: 2019-11-19 | Discharge: 2019-11-25 | DRG: 603 | Disposition: A | Discharge status: Home / Self Care | Payer: Medicare Other | Attending: Internal Medicine | Admitting: Internal Medicine

## 2019-11-19 ENCOUNTER — Other Ambulatory Visit: Payer: Self-pay

## 2019-11-19 DIAGNOSIS — R6510 Systemic inflammatory response syndrome (SIRS) of non-infectious origin without acute organ dysfunction: Secondary | ICD-10-CM | POA: Diagnosis not present

## 2019-11-19 DIAGNOSIS — L03211 Cellulitis of face: Principal | ICD-10-CM | POA: Diagnosis present

## 2019-11-19 DIAGNOSIS — I480 Paroxysmal atrial fibrillation: Secondary | ICD-10-CM | POA: Diagnosis not present

## 2019-11-19 DIAGNOSIS — L0201 Cutaneous abscess of face: Secondary | ICD-10-CM | POA: Diagnosis present

## 2019-11-19 DIAGNOSIS — E1140 Type 2 diabetes mellitus with diabetic neuropathy, unspecified: Secondary | ICD-10-CM | POA: Diagnosis present

## 2019-11-19 DIAGNOSIS — E875 Hyperkalemia: Secondary | ICD-10-CM | POA: Diagnosis not present

## 2019-11-19 DIAGNOSIS — Z8673 Personal history of transient ischemic attack (TIA), and cerebral infarction without residual deficits: Secondary | ICD-10-CM | POA: Diagnosis not present

## 2019-11-19 DIAGNOSIS — W268XXA Contact with other sharp object(s), not elsewhere classified, initial encounter: Secondary | ICD-10-CM

## 2019-11-19 DIAGNOSIS — E1122 Type 2 diabetes mellitus with diabetic chronic kidney disease: Secondary | ICD-10-CM | POA: Diagnosis present

## 2019-11-19 DIAGNOSIS — Z20822 Contact with and (suspected) exposure to covid-19: Secondary | ICD-10-CM | POA: Diagnosis present

## 2019-11-19 DIAGNOSIS — L0390 Cellulitis, unspecified: Secondary | ICD-10-CM | POA: Diagnosis present

## 2019-11-19 DIAGNOSIS — E7800 Pure hypercholesterolemia, unspecified: Secondary | ICD-10-CM

## 2019-11-19 DIAGNOSIS — Z794 Long term (current) use of insulin: Secondary | ICD-10-CM

## 2019-11-19 DIAGNOSIS — I10 Essential (primary) hypertension: Secondary | ICD-10-CM | POA: Diagnosis not present

## 2019-11-19 DIAGNOSIS — Z888 Allergy status to other drugs, medicaments and biological substances status: Secondary | ICD-10-CM | POA: Diagnosis not present

## 2019-11-19 DIAGNOSIS — K219 Gastro-esophageal reflux disease without esophagitis: Secondary | ICD-10-CM | POA: Diagnosis present

## 2019-11-19 DIAGNOSIS — B9561 Methicillin susceptible Staphylococcus aureus infection as the cause of diseases classified elsewhere: Secondary | ICD-10-CM | POA: Diagnosis not present

## 2019-11-19 DIAGNOSIS — I129 Hypertensive chronic kidney disease with stage 1 through stage 4 chronic kidney disease, or unspecified chronic kidney disease: Secondary | ICD-10-CM | POA: Diagnosis present

## 2019-11-19 DIAGNOSIS — N1832 Chronic kidney disease, stage 3b: Secondary | ICD-10-CM | POA: Diagnosis present

## 2019-11-19 DIAGNOSIS — N179 Acute kidney failure, unspecified: Secondary | ICD-10-CM | POA: Diagnosis not present

## 2019-11-19 DIAGNOSIS — Z7901 Long term (current) use of anticoagulants: Secondary | ICD-10-CM

## 2019-11-19 DIAGNOSIS — Z79899 Other long term (current) drug therapy: Secondary | ICD-10-CM

## 2019-11-19 DIAGNOSIS — A4901 Methicillin susceptible Staphylococcus aureus infection, unspecified site: Secondary | ICD-10-CM | POA: Diagnosis not present

## 2019-11-19 DIAGNOSIS — M8580 Other specified disorders of bone density and structure, unspecified site: Secondary | ICD-10-CM | POA: Diagnosis present

## 2019-11-19 DIAGNOSIS — N189 Chronic kidney disease, unspecified: Secondary | ICD-10-CM

## 2019-11-19 DIAGNOSIS — B9562 Methicillin resistant Staphylococcus aureus infection as the cause of diseases classified elsewhere: Secondary | ICD-10-CM | POA: Diagnosis not present

## 2019-11-19 LAB — CBC WITH DIFFERENTIAL/PLATELET
Abs Immature Granulocytes: 0.12 10*3/uL — ABNORMAL HIGH (ref 0.00–0.07)
Basophils Absolute: 0.1 10*3/uL (ref 0.0–0.1)
Basophils Relative: 0 %
Eosinophils Absolute: 0.1 10*3/uL (ref 0.0–0.5)
Eosinophils Relative: 1 %
HCT: 39.2 % (ref 36.0–46.0)
Hemoglobin: 12.1 g/dL (ref 12.0–15.0)
Immature Granulocytes: 1 %
Lymphocytes Relative: 7 %
Lymphs Abs: 1.8 10*3/uL (ref 0.7–4.0)
MCH: 29.9 pg (ref 26.0–34.0)
MCHC: 30.9 g/dL (ref 30.0–36.0)
MCV: 96.8 fL (ref 80.0–100.0)
Monocytes Absolute: 1.4 10*3/uL — ABNORMAL HIGH (ref 0.1–1.0)
Monocytes Relative: 6 %
Neutro Abs: 20.7 10*3/uL — ABNORMAL HIGH (ref 1.7–7.7)
Neutrophils Relative %: 85 %
Platelets: 317 10*3/uL (ref 150–400)
RBC: 4.05 MIL/uL (ref 3.87–5.11)
RDW: 15.9 % — ABNORMAL HIGH (ref 11.5–15.5)
WBC: 24.2 10*3/uL — ABNORMAL HIGH (ref 4.0–10.5)
nRBC: 0 % (ref 0.0–0.2)

## 2019-11-19 LAB — URINALYSIS, ROUTINE W REFLEX MICROSCOPIC
Bilirubin Urine: NEGATIVE
Glucose, UA: NEGATIVE mg/dL
Hgb urine dipstick: NEGATIVE
Ketones, ur: NEGATIVE mg/dL
Nitrite: NEGATIVE
Protein, ur: NEGATIVE mg/dL
Specific Gravity, Urine: 1.009 (ref 1.005–1.030)
pH: 5 (ref 5.0–8.0)

## 2019-11-19 LAB — COMPREHENSIVE METABOLIC PANEL
ALT: 14 U/L (ref 0–44)
AST: 15 U/L (ref 15–41)
Albumin: 2.9 g/dL — ABNORMAL LOW (ref 3.5–5.0)
Alkaline Phosphatase: 66 U/L (ref 38–126)
Anion gap: 11 (ref 5–15)
BUN: 30 mg/dL — ABNORMAL HIGH (ref 8–23)
CO2: 23 mmol/L (ref 22–32)
Calcium: 8.9 mg/dL (ref 8.9–10.3)
Chloride: 102 mmol/L (ref 98–111)
Creatinine, Ser: 1.61 mg/dL — ABNORMAL HIGH (ref 0.44–1.00)
GFR calc Af Amer: 35 mL/min — ABNORMAL LOW (ref >60)
GFR calc non Af Amer: 30 mL/min — ABNORMAL LOW (ref >60)
Glucose, Bld: 216 mg/dL — ABNORMAL HIGH (ref 70–99)
Potassium: 5.2 mmol/L — ABNORMAL HIGH (ref 3.5–5.1)
Sodium: 136 mmol/L (ref 135–145)
Total Bilirubin: 0.5 mg/dL (ref 0.3–1.2)
Total Protein: 6.6 g/dL (ref 6.5–8.1)

## 2019-11-19 LAB — LACTIC ACID, PLASMA
Lactic Acid, Venous: 1 mmol/L (ref 0.5–1.9)
Lactic Acid, Venous: 1.1 mmol/L (ref 0.5–1.9)

## 2019-11-19 LAB — SARS CORONAVIRUS 2 BY RT PCR (HOSPITAL ORDER, PERFORMED IN ~~LOC~~ HOSPITAL LAB): SARS Coronavirus 2: NEGATIVE

## 2019-11-19 MED ORDER — ACETAMINOPHEN 650 MG RE SUPP: 650.0000 mg | Freq: Four times a day (QID) | RECTAL | Status: DC | PRN

## 2019-11-19 MED ORDER — ONDANSETRON HCL 4 MG/2ML IJ SOLN: 4.0000 mg | Freq: Four times a day (QID) | INTRAMUSCULAR | Status: DC | PRN

## 2019-11-19 MED ORDER — VANCOMYCIN HCL 750 MG/150ML IV SOLN
750.0000 mg | INTRAVENOUS | Status: DC
  Administered 2019-11-20: 750 mg via INTRAVENOUS
  Filled 2019-11-19: qty 150

## 2019-11-19 MED ORDER — SODIUM CHLORIDE 0.9 % IV SOLN
1.0000 g | Freq: Once | INTRAVENOUS | Status: AC
  Administered 2019-11-19: 1 g via INTRAVENOUS
  Filled 2019-11-19: qty 10

## 2019-11-19 MED ORDER — INSULIN GLARGINE 100 UNIT/ML ~~LOC~~ SOLN
5.0000 [IU] | Freq: Every day | SUBCUTANEOUS | Status: DC
  Administered 2019-11-19 – 2019-11-20 (×2): 5 [IU] via SUBCUTANEOUS
  Filled 2019-11-19 (×3): qty 0.05

## 2019-11-19 MED ORDER — ONDANSETRON HCL 4 MG PO TABS: 4.0000 mg | ORAL_TABLET | Freq: Four times a day (QID) | ORAL | Status: DC | PRN

## 2019-11-19 MED ORDER — INSULIN ASPART 100 UNIT/ML ~~LOC~~ SOLN
0.0000 [IU] | SUBCUTANEOUS | Status: DC
  Administered 2019-11-20: 8 [IU] via SUBCUTANEOUS
  Administered 2019-11-20 (×3): 3 [IU] via SUBCUTANEOUS
  Administered 2019-11-20 – 2019-11-21 (×2): 8 [IU] via SUBCUTANEOUS
  Administered 2019-11-21: 11 [IU] via SUBCUTANEOUS
  Administered 2019-11-21: 8 [IU] via SUBCUTANEOUS
  Administered 2019-11-21: 5 [IU] via SUBCUTANEOUS
  Administered 2019-11-22: 3 [IU] via SUBCUTANEOUS
  Administered 2019-11-22: 11 [IU] via SUBCUTANEOUS
  Administered 2019-11-22: 3 [IU] via SUBCUTANEOUS
  Administered 2019-11-22: 8 [IU] via SUBCUTANEOUS
  Administered 2019-11-22 – 2019-11-23 (×2): 5 [IU] via SUBCUTANEOUS
  Administered 2019-11-23 (×5): 3 [IU] via SUBCUTANEOUS
  Administered 2019-11-24: 2 [IU] via SUBCUTANEOUS
  Administered 2019-11-24: 5 [IU] via SUBCUTANEOUS
  Administered 2019-11-24 – 2019-11-25 (×4): 3 [IU] via SUBCUTANEOUS
  Administered 2019-11-25: 2 [IU] via SUBCUTANEOUS
  Administered 2019-11-25: 5 [IU] via SUBCUTANEOUS

## 2019-11-19 MED ORDER — FERROUS SULFATE 325 (65 FE) MG PO TABS
325.0000 mg | ORAL_TABLET | Freq: Three times a day (TID) | ORAL | Status: DC
  Administered 2019-11-20 – 2019-11-25 (×15): 325 mg via ORAL
  Filled 2019-11-19 (×15): qty 1

## 2019-11-19 MED ORDER — GABAPENTIN 100 MG PO CAPS
100.0000 mg | ORAL_CAPSULE | Freq: Every day | ORAL | Status: DC
  Administered 2019-11-19 – 2019-11-24 (×6): 100 mg via ORAL
  Filled 2019-11-19 (×6): qty 1

## 2019-11-19 MED ORDER — VANCOMYCIN HCL IN DEXTROSE 1-5 GM/200ML-% IV SOLN
1000.0000 mg | Freq: Once | INTRAVENOUS | Status: AC
  Administered 2019-11-19: 1000 mg via INTRAVENOUS
  Filled 2019-11-19: qty 200

## 2019-11-19 MED ORDER — DULOXETINE HCL 60 MG PO CPEP
60.0000 mg | ORAL_CAPSULE | Freq: Every day | ORAL | Status: DC
  Administered 2019-11-20 – 2019-11-25 (×6): 60 mg via ORAL
  Filled 2019-11-19 (×6): qty 1

## 2019-11-19 MED ORDER — SODIUM CHLORIDE 0.9 % IV BOLUS (SEPSIS)
1000.0000 mL | Freq: Once | INTRAVENOUS | Status: AC
  Administered 2019-11-19: 1000 mL via INTRAVENOUS

## 2019-11-19 MED ORDER — SODIUM CHLORIDE 0.9 % IV SOLN: 1000.0000 mL | INTRAVENOUS | Status: DC

## 2019-11-19 MED ORDER — SODIUM POLYSTYRENE SULFONATE 15 GM/60ML PO SUSP
15.0000 g | Freq: Once | ORAL | Status: AC
  Administered 2019-11-19: 15 g via ORAL
  Filled 2019-11-19: qty 60

## 2019-11-19 MED ORDER — ACETAMINOPHEN 500 MG PO TABS
1000.0000 mg | ORAL_TABLET | Freq: Once | ORAL | Status: AC
  Administered 2019-11-19: 1000 mg via ORAL
  Filled 2019-11-19: qty 2

## 2019-11-19 MED ORDER — ATORVASTATIN CALCIUM 10 MG PO TABS
10.0000 mg | ORAL_TABLET | Freq: Every day | ORAL | Status: DC
  Administered 2019-11-19: 10 mg via ORAL
  Filled 2019-11-19 (×2): qty 1

## 2019-11-19 MED ORDER — SENNOSIDES-DOCUSATE SODIUM 8.6-50 MG PO TABS
1.0000 | ORAL_TABLET | Freq: Every evening | ORAL | Status: DC | PRN
  Administered 2019-11-22: 1 via ORAL
  Filled 2019-11-19: qty 1

## 2019-11-19 MED ORDER — LIDOCAINE-EPINEPHRINE (PF) 2 %-1:200000 IJ SOLN
5.0000 mL | Freq: Once | INTRAMUSCULAR | Status: AC
  Administered 2019-11-19: 5 mL
  Filled 2019-11-19: qty 20

## 2019-11-19 MED ORDER — HYDROCODONE-ACETAMINOPHEN 5-325 MG PO TABS
1.0000 | ORAL_TABLET | Freq: Four times a day (QID) | ORAL | Status: DC | PRN
  Administered 2019-11-21 – 2019-11-25 (×5): 1 via ORAL
  Filled 2019-11-19 (×5): qty 1

## 2019-11-19 MED ORDER — KETOCONAZOLE 2 % EX SHAM
1.0000 "application " | MEDICATED_SHAMPOO | CUTANEOUS | Status: DC
  Administered 2019-11-25: 1 via TOPICAL
  Filled 2019-11-19: qty 120

## 2019-11-19 MED ORDER — SODIUM CHLORIDE 0.9 % IV SOLN: INTRAVENOUS | Status: DC

## 2019-11-19 MED ORDER — ACETAMINOPHEN 325 MG PO TABS
650.0000 mg | ORAL_TABLET | Freq: Four times a day (QID) | ORAL | Status: DC | PRN
  Administered 2019-11-19 – 2019-11-24 (×5): 650 mg via ORAL
  Filled 2019-11-19 (×6): qty 2

## 2019-11-19 MED ORDER — LATANOPROST 0.005 % OP SOLN
1.0000 [drp] | Freq: Every day | OPHTHALMIC | Status: DC
  Administered 2019-11-19 – 2019-11-24 (×6): 1 [drp] via OPHTHALMIC
  Filled 2019-11-19: qty 2.5

## 2019-11-19 MED ORDER — PANTOPRAZOLE SODIUM 40 MG PO TBEC
40.0000 mg | DELAYED_RELEASE_TABLET | Freq: Two times a day (BID) | ORAL | Status: DC
  Administered 2019-11-19 – 2019-11-25 (×12): 40 mg via ORAL
  Filled 2019-11-19 (×12): qty 1

## 2019-11-19 MED ORDER — MAGNESIUM OXIDE 400 (241.3 MG) MG PO TABS
400.0000 mg | ORAL_TABLET | Freq: Every day | ORAL | Status: DC
  Administered 2019-11-20 – 2019-11-25 (×6): 400 mg via ORAL
  Filled 2019-11-19 (×6): qty 1

## 2019-11-19 MED ORDER — VANCOMYCIN HCL IN DEXTROSE 1-5 GM/200ML-% IV SOLN: 1000.0000 mg | Freq: Once | INTRAVENOUS | Status: DC

## 2019-11-19 NOTE — ED Notes (Signed)
ED Provider at bedside. 

## 2019-11-19 NOTE — ED Notes (Signed)
Carb modified tray - no meat - ordered

## 2019-11-19 NOTE — Plan of Care (Signed)

## 2019-11-19 NOTE — Progress Notes (Signed)
Pharmacy Antibiotic Note  Sabrina Baker is a 79 y.o. female admitted on 11/19/2019 with cellulitis.  Pharmacy has been consulted for vancomycin dosing.  Vancomycin 1000 mg IV given at 1641. Creatinine seems elevated from baseline (last on 08/25/2019 was 1.22) today at 1.61. eCrCl ~ 22 ml/min. Last weight 80 kg (3/21).WBC count elevated at 24.2. Patient febrile on admission at 100.2.   Plan: Vancomycin 750 IV every 24 hours.  Goal trough 10-15 mcg/mL. Monitor clinical status, renal function, opportunities for de-escalation   Temp (24hrs), Avg:100.2 F (37.9 C), Min:100.2 F (37.9 C), Max:100.2 F (37.9 C)  Recent Labs  Lab 11/19/19 1326 11/19/19 1602  WBC 24.2*  --   CREATININE 1.61*  --   LATICACIDVEN 1.0 1.1    CrCl cannot be calculated (Unknown ideal weight.).    Allergies  Allergen Reactions  . Altace [Ramipril] Anaphylaxis  . Cortisone Itching and Rash  . Diovan [Valsartan] Other (See Comments)    Hyperkalemia   . Niacin Diarrhea    And nausea  . Niacin And Related Diarrhea    Antimicrobials this admission: Vancomycin 6/9 >>  Ceftriaxone 6/9 x1   Dose adjustments this admission: N/A  Microbiology results: 6/9 BCx: sent 6/9 Wound cx: sent    Thank you for allowing pharmacy to be a part of this patient's care.   Eddie Candle, PharmD PGY-1 Pharmacy Resident   Please check amion for clinical pharmacist contact number 11/19/2019 6:05 PM

## 2019-11-19 NOTE — ED Provider Notes (Signed)
[facility] EMERGENCY DEPARTMENT Provider Note   CSN: [id number] Arrival date & time: 11/19/19  1306     History Chief Complaint  Patient presents with  . Facial Swelling    Sabrina Baker is a 79 y.o. female.  HPI Patient was shaving her forehead with a clean razor.  She got a nick on her skin.  This happened a couple of days ago.  Initially it did not seem like a serious wound.  Over the past couple of days it became very swollen and tender.  Redness started to spread over all of her forehead and then around her eyes.  She reports now it is very tender on her forehead and scalp at the hairline.  She has pain if she raises her eyebrows.  She denies any pain in her eyes.  She does not have pain with turning her eyes from side to side or up and down.  She has also developed a tender swollen nodule behind the right ear.  She was not aware that she had a fever until she was seen in her PCP office today.  Low-grade fever identified and patient referred to the emergency department for further treatment and management.  Patient is a type II diabetic.  She reports blood sugars have been running from the 150s to 90s.  Patient denies any prior history of developing abscesses or skin infections.    Past Medical History:  Diagnosis Date  . Chronic kidney disease    stage 3  . Diabetes mellitus without complication ([place])   . Hypercholesteremia   . Hypertension   . Neuromuscular disorder (HCC)    neuropathy  . Osteopenia   . Retinal detachment   . TIA (transient ischemic attack)   . Vitamin B12 deficiency     Patient Active Problem List   Diagnosis Date Noted  . Cellulitis 11/19/2019  . Atrial fibrillation ([place]) 12/23/2018  . ARDS (adult respiratory distress syndrome) ([place])   . COVID-19   . Acute respiratory failure with hypoxia ([place]) 09/09/2018  . Mixed dyslipidemia 07/10/2016  . Osteopenia 07/10/2016  . Vitamin B12 deficiency 07/10/2016  . 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 Aspiration pneumonia ([place]) 04/17/2012  . Gastroenteritis 04/17/2012  . Dehydration 04/17/2012  . Diabetes mellitus ([place]) 04/17/2012  . HTN (hypertension) 04/17/2012  . TIA (transient ischemic attack) 04/17/2012    Past Surgical History:  Procedure Laterality Date  . [place] VITRECTOMY WITH 20 GAUGE MVR PORT Right 03/16/2015   Procedure: 25 GAUGE PARS PLANA VITRECTOMY WITH 23 GAUGE MVR PORT;  Surgeon: Hayden Pedro, MD;  Location: [facility];  Service: Ophthalmology;  Laterality: Right;  . ANKLE SURGERY    . BREAST EXCISIONAL BIOPSY    . BREAST LUMPECTOMY WITH RADIOACTIVE SEED LOCALIZATION Right 01/24/2016   Procedure: RIGHT BREAST LUMPECTOMY WITH RADIOACTIVE SEED LOCALIZATION;  Surgeon: Coralie Keens, MD;  Location: [facility];  Service: General;  Laterality: Right;  . EYE SURGERY     detached retna X2  . HEEL SPUR SURGERY  2012  . INJECTION OF SILICONE OIL Right 94/12/6544   Procedure: Extraction OF SILICONE OIL;  Surgeon: Hayden Pedro, MD;  Location: [facility];  Service: Ophthalmology;  Laterality: Right;  . ORIF ANKLE FRACTURE Left 06/17/2014   Procedure: OPEN REDUCTION INTERNAL FIXATION (ORIF) LEFT BIMALLEOLAR ANKLE FRACTURE;  Surgeon: Marianna Payment, MD;  Location: [facility];  Service: Orthopedics;  Laterality: Left;  . PHOTOCOAGULATION WITH LASER  Right 03/16/2015   Procedure: PHOTOCOAGULATION WITH LASER;  Surgeon: Hayden Pedro, MD;  Location: [facility];  Service: Ophthalmology;  Laterality: Right;  . SHOULDER ARTHROSCOPY Right    plates and screws  . VITRECTOMY Right 03/16/2015  . WRIST ARTHROPLASTY Left    plates and screws and bone graft     OB History   No obstetric history on file.     No family history on file.  Social History   Tobacco Use  . Smoking status: Never Smoker  . Smokeless tobacco: Never Used  Substance Use Topics  . Alcohol use: No  . Drug use: No    Home Medications Prior to Admission  medications   Medication Sig Start Date End Date Taking? Authorizing Provider  amLODipine (NORVASC) 10 MG tablet Take 10 mg by mouth daily. 06/27/19  Yes [provider]  apixaban (ELIQUIS) 5 MG TABS tablet Take 1 tablet (5 mg total) by mouth 2 (two) times daily. 10/11/18  Yes Bonnielee Haff, MD  atorvastatin (LIPITOR) 10 MG tablet Take 10 mg by mouth daily.   Yes [provider]  bacitracin-polymyxin b (POLYSPORIN) ophthalmic ointment Place 1 application into the left eye daily as needed (For eye infection).  07/07/19  Yes [provider]  CALCIUM-MAGNESIUM-ZINC PO Take 1 tablet by mouth in the morning and at bedtime.    Yes [provider]  calcium-vitamin D (OSCAL WITH D) 500-200 MG-UNIT TABS tablet Take 1 tablet by mouth daily.    Yes [provider]  carvedilol (COREG) 25 MG tablet Take 25 mg by mouth 2 (two) times daily with a meal.   Yes [provider]  cholecalciferol (VITAMIN D) 1000 UNITS tablet Take 1,000 Units by mouth daily.   Yes [provider]  colchicine 0.6 MG tablet Take 0.6 mg by mouth daily as needed (gout). Started 04/07/13   Yes [provider]  [employer] B-12 500 MCG tablet Take 1,000 mcg by mouth daily. 11/15/18  Yes [provider]  DULoxetine (CYMBALTA) 60 MG capsule Take by mouth daily. 07/03/18  Yes [provider]  Ferrous Sulfate (IRON) 325 (65 FE) MG TABS Take 1 tablet by mouth 3 (three) times daily.   Yes [provider]  furosemide (LASIX) 20 MG tablet Take 20 mg by mouth daily. 08/21/19  Yes [provider]  gabapentin (NEURONTIN) 100 MG capsule Take 100 mg by mouth at bedtime.  06/09/17  Yes [provider]  HYDROcodone-acetaminophen (NORCO/VICODIN) 5-325 MG tablet Take 1 tablet by mouth every 6 (six) hours as needed for moderate pain. 04/17/19  Yes Hilts, Legrand Como, MD  Insulin Glargine, 2 Unit Dial, (TOUJEO MAX SOLOSTAR) 300 UNIT/ML SOPN Inject 14-15 Units into  the skin daily. Inject 14 units when sugar is 157 if higher inject 15 units per patient   Yes [provider]  Insulin Pen Needle (FIFTY50 PEN NEEDLES) 32G X 4 MM MISC USE TO INJECT INSULIN ONCE A DAY 04/05/18  Yes [provider]  ketoconazole (NIZORAL) 2 % shampoo Apply 1 application topically 2 (two) times a week.  02/27/19  Yes [provider]  Lancets 30G MISC Use to test blood sugars 3 times a day (Dx: E11.65)   Yes [provider]  latanoprost (XALATAN) 0.005 % ophthalmic solution Place 1 drop into the left eye at bedtime.  05/20/17  Yes [provider]  Magnesium 500 MG CAPS Take 1 capsule by mouth daily.   Yes [provider]  mupirocin ointment Drue Stager)  2 % Place 1 application into the nose daily as needed (For rash).    Yes [provider]  nateglinide (STARLIX) 120 MG tablet Take 120 mg by mouth daily as needed (If sugar is below 130).  05/30/19  Yes [provider]  ONETOUCH VERIO test strip USE AS DIRECTED. TESTING FREQUENCY  3 X/DAILY (DX E11.65) 11/17/18  Yes [provider]  pantoprazole (PROTONIX) 40 MG tablet Take 40 mg by mouth 2 (two) times daily.   Yes [provider]  triamcinolone cream (KENALOG) 0.1 % Apply 1 application topically 2 (two) times daily as needed (For rash).  01/09/17  Yes [provider]  TRULICITY 1.5 KG/4.0NU SOPN Inject 1.5 mg into the skin every 7 (seven) days.  11/25/18  Yes [provider]  acetaminophen (TYLENOL) 160 MG/5ML solution Take 20.3 mLs (650 mg total) by mouth every 4 (four) hours as needed for fever. Patient not taking: Reported on 11/19/2019 10/11/18   Bonnielee Haff, MD  bevacizumab (AVASTIN) 400 MG/16ML SOLN Inject 5 mg/kg into the vein once.    [provider]  feeding supplement, ENSURE ENLIVE, (ENSURE ENLIVE) LIQD Take 237 mLs by mouth daily at 8 pm. Patient not taking: Reported on 11/19/2019 10/11/18   Bonnielee Haff, MD     Allergies    Altace [ramipril], Cortisone, Diovan [valsartan], Niacin, and Niacin and related  Review of Systems   Review of Systems 10 systems reviewed and negative except as per HPI. Physical Exam Updated Vital Signs BP (!) 151/71 (BP Location: Right Arm)   Pulse 83   Temp 100.2 F (37.9 C) (Oral)   Resp 18   SpO2 98%   Physical Exam  Constitutional: Patient is alert and appropriate.  No confusion.  No respiratory distress. HEENT.  See attached images for abscess on forehead with associated cellulitis and periorbital swelling. Extraocular motions intact.  No scleral injection. Airway widely patent.  Mucous membranes pink and moist.  No oral lesions. Fluctuant lymph node behind the right ear.  Pinna otherwise normal. Cardiovascular: Heart regular no rub murmur gallop Respiratory: Clear to auscultation no respiratory distress GI: Abdomen soft and nontender. Extremity: Bilateral extremities good condition no peripheral edema feet and legs without wounds. Neurologic: Patient is alert and appropriate.  Cognitive function normal.  Movements coordinated purposeful symmetric.      ED Results / Procedures / Treatments   Labs (all labs ordered are listed, but only abnormal results are displayed) Labs Reviewed  COMPREHENSIVE METABOLIC PANEL - Abnormal; Notable for the following components:      Result Value   Potassium 5.2 (*)    Glucose, Bld 216 (*)    BUN 30 (*)    Creatinine, Ser 1.61 (*)    Albumin 2.9 (*)    GFR calc non Af Amer 30 (*)    GFR calc Af Amer 35 (*)    All other components within normal limits  CBC WITH DIFFERENTIAL/PLATELET - Abnormal; Notable for the following components:   WBC 24.2 (*)    RDW 15.9 (*)    Neutro Abs 20.7 (*)    Monocytes Absolute 1.4 (*)    Abs Immature Granulocytes 0.12 (*)    All other components within normal limits  URINALYSIS, ROUTINE W REFLEX MICROSCOPIC - Abnormal; Notable for the following components:   Leukocytes,Ua LARGE  (*)    Bacteria, UA RARE (*)    All other components within normal limits  SARS CORONAVIRUS 2 BY RT PCR (HOSPITAL ORDER, [facility])  CULTURE, BLOOD (ROUTINE X 2)  CULTURE, BLOOD (ROUTINE X 2)  AEROBIC CULTURE (SUPERFICIAL SPECIMEN)  LACTIC ACID, PLASMA  LACTIC ACID, PLASMA  BASIC METABOLIC PANEL  CBC  HEMOGLOBIN A1C    EKG None  Radiology No results found.  Procedures .Marland KitchenIncision and Drainage  Date/Time: 11/19/2019 7:49 PM Performed by: Charlesetta Shanks, MD Authorized by: Charlesetta Shanks, MD   Consent:    Consent obtained:  Verbal   Consent given by:  Patient   Risks discussed:  Bleeding, incomplete drainage, pain and infection Location:    Type:  Abscess   Location:  Head   Head location:  Face Pre-procedure details:    Skin preparation:  Betadine Anesthesia (see MAR for exact dosages):    Anesthesia method:  Local infiltration   Local anesthetic:  Lidocaine 2% WITH epi Procedure type:    Complexity:  Simple Procedure details:    Needle aspiration: yes     Needle size:  18 G   Drainage:  Purulent   Drainage amount:  Scant Post-procedure details:    Patient tolerance of procedure:  Tolerated well, no immediate complications Comments:     18-gauge used to aspirate fluctuant area of facial abscess.  Small volume of purulent, thin material obtained.  Additional small amount expressed manually.   (including critical care time)  Medications Ordered in ED Medications  HYDROcodone-acetaminophen (NORCO/VICODIN) 5-325 MG per tablet 1 tablet (has no administration in time range)  atorvastatin (LIPITOR) tablet 10 mg (10 mg Oral Given 11/19/19 1911)  DULoxetine (CYMBALTA) DR capsule 60 mg (has no administration in time range)  pantoprazole (PROTONIX) EC tablet 40 mg (has no administration in time range)  ferrous sulfate tablet 325 mg (has no administration in time range)  gabapentin (NEURONTIN) capsule 100 mg (has no administration in time  range)  magnesium oxide (MAG-OX) tablet 400 mg (has no administration in time range)  ketoconazole (NIZORAL) 2 % shampoo 1 application (has no administration in time range)  latanoprost (XALATAN) 0.005 % ophthalmic solution 1 drop (has no administration in time range)  0.9 %  sodium chloride infusion ( Intravenous New Bag/Given 11/19/19 1911)  acetaminophen (TYLENOL) tablet 650 mg (has no administration in time range)    Or  acetaminophen (TYLENOL) suppository 650 mg (has no administration in time range)  senna-docusate (Senokot-S) tablet 1 tablet (has no administration in time range)  ondansetron (ZOFRAN) tablet 4 mg (has no administration in time range)    Or  ondansetron (ZOFRAN) injection 4 mg (has no administration in time range)  insulin glargine (LANTUS) injection 5 Units (has no administration in time range)  insulin aspart (novoLOG) injection 0-15 Units (has no administration in time range)  vancomycin (VANCOREADY) IVPB 750 mg/150 mL (has no administration in time range)  acetaminophen (TYLENOL) tablet 1,000 mg (1,000 mg Oral Given 11/19/19 1323)  lidocaine-EPINEPHrine (XYLOCAINE W/EPI) 2 %-1:200000 (PF) injection 5 mL (5 mLs Infiltration Given 11/19/19 1643)  vancomycin (VANCOCIN) IVPB 1000 mg/200 mL premix (0 mg Intravenous Stopped 11/19/19 1753)  cefTRIAXone (ROCEPHIN) 1 g in sodium chloride 0.9 % 100 mL IVPB (0 g Intravenous Stopped 11/19/19 1712)  sodium chloride 0.9 % bolus 1,000 mL (0 mLs Intravenous Stopped 11/19/19 1843)  sodium polystyrene (KAYEXALATE) 15 GM/60ML suspension 15 g (15 g Oral Given 11/19/19 1911)    ED Course  I have reviewed the triage vital signs and the nursing notes.  Pertinent labs & imaging results that were available during my care of the patient were reviewed  by me and considered in my medical decision making (see chart for details).  Clinical Course as of Nov 19 1946  Wed Nov 19, 2019  1927 Consult: Dr. Dema Severin of general surgery advises patient should have  consult to facial trauma on-call.   [MP]    Clinical Course User Index [MP] Charlesetta Shanks, MD   MDM Rules/Calculators/A&P                     Consult: Dr. Kurtis Bushman for admission.  Dr. Kurtis Bushman requests consultation to general surgery or facial surgical team for potential more extensive I&D.  Patient presents with a large facial abscess and associated cellulitis.  Significant leukocytosis.  Low-grade fever.  Patient's mental status is clear.  Lactate is normal.  Will admit for ongoing antibiotics and monitoring of abscess.  Dr. Naida Sleight requests consultation to surgery for possible more extensive incision and drainage.  At this time, pending return call from ENT on for facial trauma.  Will place a consult for plastic surgery however no on-call at this time, anticipated will be tomorrow before plastic surgery will see the patient. Final Clinical Impression(s) / ED Diagnoses Final diagnoses:  Facial cellulitis  Facial abscess  SIRS (systemic inflammatory response syndrome) ([place])    Rx / DC Orders ED Discharge Orders    None       Charlesetta Shanks, MD 11/19/19 1952

## 2019-11-19 NOTE — H&P (Signed)
History and Physical    WYLIE RUSSON MEQ:[id number] DOB: 10-01-1940 DOA: 11/19/2019  PCP: [name], MD  Patient coming from: Home   Chief Complaint: Facial swelling and erythema  HPI: Sabrina Baker is a 79 y.o. female with medical history significant of chronic kidney disease stage III, diabetes mellitus type 2, paroxysmal atrial fibrillation, hypercholesterolemia, hypertension was sent by her primary care today for treatment of facial cellulitis and abscess.  About a week ago patient decided to use a razor to cut her hair that was bothering her and it was falling on her forehead.  By Friday patient noticed some erythema of her face.  Progressively erythema and warmth worsened and today could not open her right eye.  Noticed a large bump on her mid forehead.  She called her PCP for follow-up today.  Was seen at PCP office and was found to be febrile and was sent to ER for further evaluation.  Patient denies shortness of breath, difficulty swallowing, chest pain, fever other than what she was found with here.  Denies chills, nausea or vomiting.  ED Course: In the ER patient was found to be febrile with temperature of 100.2.  WBC elevated at 24.2.  Blood cultures were obtained.  Was given ceftriaxone and vancomycin x1 dose.  ER attending attempted I&D a small amount and obtained specimen culture.  Noticed pus draining from forehead.  Blood pressure on presentation was 136/114 and it dropped to 117/97.  Pulse 81.  Review of Systems: All systems reviewed and otherwise negative.    Past Medical History:  Diagnosis Date  . Chronic kidney disease    stage 3  . Diabetes mellitus without complication ([place])   . Hypercholesteremia   . Hypertension   . Neuromuscular disorder (HCC)    neuropathy  . Osteopenia   . Retinal detachment   . TIA (transient ischemic attack)   . Vitamin B12 deficiency     Past Surgical History:  Procedure Laterality Date  . [place] VITRECTOMY WITH  20 GAUGE MVR PORT Right 03/16/2015   Procedure: 25 GAUGE PARS PLANA VITRECTOMY WITH 23 GAUGE MVR PORT;  Surgeon: Hayden Pedro, MD;  Location: [place];  Service: Ophthalmology;  Laterality: Right;  . ANKLE SURGERY    . BREAST EXCISIONAL BIOPSY    . BREAST LUMPECTOMY WITH RADIOACTIVE SEED LOCALIZATION Right 01/24/2016   Procedure: RIGHT BREAST LUMPECTOMY WITH RADIOACTIVE SEED LOCALIZATION;  Surgeon: Coralie Keens, MD;  Location: [place];  Service: General;  Laterality: Right;  . EYE SURGERY     detached retna X2  . HEEL SPUR SURGERY  2012  . INJECTION OF SILICONE OIL Right 29/12/9890   Procedure: Extraction OF SILICONE OIL;  Surgeon: Hayden Pedro, MD;  Location: [place];  Service: Ophthalmology;  Laterality: Right;  . ORIF ANKLE FRACTURE Left 06/17/2014   Procedure: OPEN REDUCTION INTERNAL FIXATION (ORIF) LEFT BIMALLEOLAR ANKLE FRACTURE;  Surgeon: Marianna Payment, MD;  Location: [facility];  Service: Orthopedics;  Laterality: Left;  . PHOTOCOAGULATION WITH LASER Right 03/16/2015   Procedure: PHOTOCOAGULATION WITH LASER;  Surgeon: Hayden Pedro, MD;  Location: [place];  Service: Ophthalmology;  Laterality: Right;  . SHOULDER ARTHROSCOPY Right    plates and screws  . VITRECTOMY Right 03/16/2015  . WRIST ARTHROPLASTY Left    plates and screws and bone graft     reports that she has never smoked. She has never used smokeless tobacco. She reports that she does not drink  alcohol or use drugs.  Allergies  Allergen Reactions  . Altace [Ramipril] Anaphylaxis  . Cortisone Itching and Rash  . Diovan [Valsartan] Other (See Comments)    Hyperkalemia   . Niacin Diarrhea    And nausea  . Niacin And Related Diarrhea    No family history on file.   Prior to Admission medications   Medication Sig Start Date End Date Taking? Authorizing Provider  amLODipine (NORVASC) 10 MG tablet Take 10 mg by mouth daily. 06/27/19  Yes [provider]  apixaban (ELIQUIS) 5 MG TABS tablet  Take 1 tablet (5 mg total) by mouth 2 (two) times daily. 10/11/18  Yes Bonnielee Haff, MD  atorvastatin (LIPITOR) 10 MG tablet Take 10 mg by mouth daily.   Yes [provider]  bacitracin-polymyxin b (POLYSPORIN) ophthalmic ointment Place 1 application into the left eye daily as needed (For eye infection).  07/07/19  Yes [provider]  CALCIUM-MAGNESIUM-ZINC PO Take 1 tablet by mouth in the morning and at bedtime.    Yes [provider]  calcium-vitamin D (OSCAL WITH D) 500-200 MG-UNIT TABS tablet Take 1 tablet by mouth daily.    Yes [provider]  carvedilol (COREG) 25 MG tablet Take 25 mg by mouth 2 (two) times daily with a meal.   Yes [provider]  cholecalciferol (VITAMIN D) 1000 UNITS tablet Take 1,000 Units by mouth daily.   Yes [provider]  colchicine 0.6 MG tablet Take 0.6 mg by mouth daily as needed (gout). Started 04/07/13   Yes [provider]  [employer] B-12 500 MCG tablet Take 1,000 mcg by mouth daily. 11/15/18  Yes [provider]  DULoxetine (CYMBALTA) 60 MG capsule Take by mouth daily. 07/03/18  Yes [provider]  Ferrous Sulfate (IRON) 325 (65 FE) MG TABS Take 1 tablet by mouth 3 (three) times daily.   Yes [provider]  furosemide (LASIX) 20 MG tablet Take 20 mg by mouth daily. 08/21/19  Yes [provider]  gabapentin (NEURONTIN) 100 MG capsule Take 100 mg by mouth at bedtime.  06/09/17  Yes [provider]  HYDROcodone-acetaminophen (NORCO/VICODIN) 5-325 MG tablet Take 1 tablet by mouth every 6 (six) hours as needed for moderate pain. 04/17/19  Yes Hilts, Legrand Como, MD  Insulin Glargine, 2 Unit Dial, (TOUJEO MAX SOLOSTAR) 300 UNIT/ML SOPN Inject 14-15 Units into the skin daily. Inject 14 units when sugar is 157 if higher inject 15 units per patient   Yes [provider]  Insulin Pen Needle (FIFTY50 PEN NEEDLES) 32G X 4 MM MISC USE TO INJECT INSULIN ONCE A DAY  04/05/18  Yes [provider]  ketoconazole (NIZORAL) 2 % shampoo Apply 1 application topically 2 (two) times a week.  02/27/19  Yes [provider]  Lancets 30G MISC Use to test blood sugars 3 times a day (Dx: E11.65)   Yes [provider]  latanoprost (XALATAN) 0.005 % ophthalmic solution Place 1 drop into the left eye at bedtime.  05/20/17  Yes [provider]  Magnesium 500 MG CAPS Take 1 capsule by mouth daily.   Yes [provider]  mupirocin ointment (BACTROBAN) 2 % Place 1 application into the nose daily as needed (For rash).    Yes [provider]  nateglinide (STARLIX) 120 MG tablet Take 120 mg by mouth daily as needed (If sugar is below 130).  05/30/19  Yes [provider]  ONETOUCH VERIO test strip USE AS DIRECTED. TESTING FREQUENCY  3 X/DAILY (DX E11.65) 11/17/18  Yes [provider]  pantoprazole (PROTONIX) 40 MG tablet Take 40 mg by mouth 2 (two) times daily.   Yes [provider]  triamcinolone cream (KENALOG) 0.1 % Apply 1 application topically 2 (two) times daily as needed (For rash).  01/09/17  Yes [provider]  TRULICITY 1.5 MG/8.6PY SOPN Inject 1.5 mg into the skin every 7 (seven) days.  11/25/18  Yes [provider]  acetaminophen (TYLENOL) 160 MG/5ML solution Take 20.3 mLs (650 mg total) by mouth every 4 (four) hours as needed for fever. Patient not taking: Reported on 11/19/2019 10/11/18   Bonnielee Haff, MD  bevacizumab (AVASTIN) 400 MG/16ML SOLN Inject 5 mg/kg into the vein once.    [provider]  feeding supplement, ENSURE ENLIVE, (ENSURE ENLIVE) LIQD Take 237 mLs by mouth daily at 8 pm. Patient not taking: Reported on 11/19/2019 10/11/18   Bonnielee Haff, MD    Physical Exam: Vitals:   11/19/19 1700 11/19/19 1715 11/19/19 1730 11/19/19 1745  BP:  124/83 134/73 94/69  Pulse: 77 82 78 79  Resp:      Temp:      TempSrc:      SpO2: 94% 91% 93% (!) 85%     Constitutional: NAD, calm, comfortable Vitals:   11/19/19 1700 11/19/19 1715 11/19/19 1730 11/19/19 1745  BP:  124/83 134/73 94/69  Pulse: 77 82 78 79  Resp:      Temp:      TempSrc:      SpO2: 94% 91% 93% (!) 85%   Face: mid forehead with fluctuant mass, erythema, warmth. Painful. Mild blood drainage from I/d attempt Eyes: Rt eye shut, unable to open, left eye mildly open bilateral eyes with erythema and warmth and swelling ENMT: Dry mucous membranes  Neck: normal, supple, Rt enlarged lymph node behind the ear Respiratory: clear to auscultation bilaterally, no wheezing, no crackles. Normal respiratory effort. No accessory muscle use.  Cardiovascular: Regular rate and rhythm, no murmurs / rubs / gallops. No extremity edema.  Abdomen: no tenderness, no masses palpated. No hepatosplenomegaly. Bowel sounds positive.  Musculoskeletal: no clubbing / cyanosis. Normal muscle tone.  Skin: warm, dry Neurologic: CN 2-12 grossly intact. Awake and alert and oriented Psychiatric: Normal judgment and insight.Normal mood.    Labs on Admission: I have personally reviewed following labs and imaging studies  CBC: Recent Labs  Lab 11/19/19 1326  WBC 24.2*  NEUTROABS 20.7*  HGB 12.1  HCT 39.2  MCV 96.8  PLT 195   Basic Metabolic Panel: Recent Labs  Lab 11/19/19 1326  NA 136  K 5.2*  CL 102  CO2 23  GLUCOSE 216*  BUN 30*  CREATININE 1.61*  CALCIUM 8.9   GFR: CrCl cannot be calculated (Unknown ideal weight.). Liver Function Tests: Recent Labs  Lab 11/19/19 1326  AST 15  ALT 14  ALKPHOS 66  BILITOT 0.5  PROT 6.6  ALBUMIN 2.9*   No results for input(s): LIPASE, AMYLASE in the last 168 hours. No results for input(s): AMMONIA in the last 168 hours. Coagulation Profile: No results for input(s): INR, PROTIME in the last 168 hours. Cardiac Enzymes: No results for input(s): CKTOTAL, CKMB, CKMBINDEX, TROPONINI in the last 168 hours. BNP (last 3 results) No results for  input(s): PROBNP in the last 8760 hours. HbA1C: No results for input(s): HGBA1C in the last 72 hours. CBG: No results for input(s): GLUCAP in the last 168 hours. Lipid Profile: No results for input(s): CHOL,  HDL, LDLCALC, TRIG, CHOLHDL, LDLDIRECT in the last 72 hours. Thyroid Function Tests: No results for input(s): TSH, T4TOTAL, FREET4, T3FREE, THYROIDAB in the last 72 hours. Anemia Panel: No results for input(s): VITAMINB12, FOLATE, FERRITIN, TIBC, IRON, RETICCTPCT in the last 72 hours. Urine analysis:    Component Value Date/Time   COLORURINE YELLOW 11/19/2019 [place] 11/19/2019 1645   LABSPEC 1.009 11/19/2019 1645   PHURINE 5.0 11/19/2019 1645   GLUCOSEU NEGATIVE 11/19/2019 1645   HGBUR NEGATIVE 11/19/2019 [place] NEGATIVE 11/19/2019 [place] 11/19/2019 1645   PROTEINUR NEGATIVE 11/19/2019 1645   UROBILINOGEN 0.2 04/17/2012 0835   NITRITE NEGATIVE 11/19/2019 1645   LEUKOCYTESUR LARGE (A) 11/19/2019 1645    Radiological Exams on Admission: No results found.  EKG: pending  Assessment/Plan Active Problems:   Cellulitis   1.facial cellulitis with abscess-secondary to using razor to cut her hair Fever with elevated WBC Little I/D in ER, wound cx sent ER to consult General surgery Hold Eliquis for further I/D  Continue IV vancomycin Consult pharmacy F/u bcx done in ED  2.DM Type2 Hold home dose medication Start with RISS, fs Add lantus 5 Units tonight Ck HA1c  3.Acute on CKD stage III-creatinine mildly elevated. Possibly prerenal as she has been having fevers Will hydrate gently F/u renal function  4. PAF- on beta blk and eliquis Will hole a/c for possible procedure for above Hold coreg today, in case pt's bp drops as she is febrile, wbc. If stable can resume starting with low dose in am  5.HTN- hold bp meds as bp on softer/stable side for now   6.Hyperkalemia-mild. Likely from #3. Start IVF Monitor levels.   Give kayexalate15gm x1  6.  Hypercholesterolemia-continue home statin    DVT prophylaxis: Eiquis.Marland Kitchen> now SCD Code Status: full  Family Communication: by bedside Disposition Plan: back home when stable  Consults called: general surgery by Er  Admission status: inpatient as pt requires >2MN stays for severity of illness    Nolberto Hanlon MD [facility] Pager [phone]   If 7PM-7AM, please contact night-coverage [email or web address] Password [facility]  11/19/2019, 5:56 PM

## 2019-11-19 NOTE — ED Triage Notes (Signed)
Pt sent here from PCP for further eval of extensive swelling to face, behind hear, and neck. Area swollen, red, and painful. Reports trimming some baby hairs with a razor and symptoms developed after that. Hx diabetes.

## 2019-11-20 ENCOUNTER — Inpatient Hospital Stay (HOSPITAL_COMMUNITY): Payer: Medicare Other

## 2019-11-20 ENCOUNTER — Encounter (HOSPITAL_COMMUNITY): Payer: Self-pay | Admitting: Internal Medicine

## 2019-11-20 DIAGNOSIS — L0201 Cutaneous abscess of face: Secondary | ICD-10-CM

## 2019-11-20 DIAGNOSIS — B9562 Methicillin resistant Staphylococcus aureus infection as the cause of diseases classified elsewhere: Secondary | ICD-10-CM

## 2019-11-20 DIAGNOSIS — A4901 Methicillin susceptible Staphylococcus aureus infection, unspecified site: Secondary | ICD-10-CM

## 2019-11-20 LAB — CBC
HCT: 38.1 % (ref 36.0–46.0)
Hemoglobin: 11.6 g/dL — ABNORMAL LOW (ref 12.0–15.0)
MCH: 29.8 pg (ref 26.0–34.0)
MCHC: 30.4 g/dL (ref 30.0–36.0)
MCV: 97.9 fL (ref 80.0–100.0)
Platelets: 283 10*3/uL (ref 150–400)
RBC: 3.89 MIL/uL (ref 3.87–5.11)
RDW: 15.9 % — ABNORMAL HIGH (ref 11.5–15.5)
WBC: 24 10*3/uL — ABNORMAL HIGH (ref 4.0–10.5)
nRBC: 0 % (ref 0.0–0.2)

## 2019-11-20 LAB — GLUCOSE, CAPILLARY
Glucose-Capillary: 165 mg/dL — ABNORMAL HIGH (ref 70–99)
Glucose-Capillary: 173 mg/dL — ABNORMAL HIGH (ref 70–99)
Glucose-Capillary: 196 mg/dL — ABNORMAL HIGH (ref 70–99)
Glucose-Capillary: 260 mg/dL — ABNORMAL HIGH (ref 70–99)
Glucose-Capillary: 274 mg/dL — ABNORMAL HIGH (ref 70–99)
Glucose-Capillary: 72 mg/dL (ref 70–99)

## 2019-11-20 LAB — BASIC METABOLIC PANEL
Anion gap: 11 (ref 5–15)
BUN: 23 mg/dL (ref 8–23)
CO2: 27 mmol/L (ref 22–32)
Calcium: 8.4 mg/dL — ABNORMAL LOW (ref 8.9–10.3)
Chloride: 103 mmol/L (ref 98–111)
Creatinine, Ser: 1.23 mg/dL — ABNORMAL HIGH (ref 0.44–1.00)
GFR calc Af Amer: 49 mL/min — ABNORMAL LOW (ref >60)
GFR calc non Af Amer: 42 mL/min — ABNORMAL LOW (ref >60)
Glucose, Bld: 46 mg/dL — ABNORMAL LOW (ref 70–99)
Potassium: 5.1 mmol/L (ref 3.5–5.1)
Sodium: 141 mmol/L (ref 135–145)

## 2019-11-20 LAB — HEMOGLOBIN A1C
Hgb A1c MFr Bld: 8.9 % — ABNORMAL HIGH (ref 4.8–5.6)
Mean Plasma Glucose: 208.73 mg/dL

## 2019-11-20 LAB — SURGICAL PCR SCREEN
MRSA, PCR: NEGATIVE
Staphylococcus aureus: NEGATIVE

## 2019-11-20 MED ORDER — IOHEXOL 300 MG/ML  SOLN
75.0000 mL | Freq: Once | INTRAMUSCULAR | Status: AC | PRN
  Administered 2019-11-20: 75 mL via INTRAVENOUS

## 2019-11-20 MED ORDER — CARVEDILOL 12.5 MG PO TABS
12.5000 mg | ORAL_TABLET | Freq: Two times a day (BID) | ORAL | Status: DC
  Administered 2019-11-20 – 2019-11-24 (×8): 12.5 mg via ORAL
  Filled 2019-11-20 (×8): qty 1

## 2019-11-20 MED ORDER — ATORVASTATIN CALCIUM 10 MG PO TABS
10.0000 mg | ORAL_TABLET | Freq: Every day | ORAL | Status: DC
  Administered 2019-11-20 – 2019-11-24 (×5): 10 mg via ORAL
  Filled 2019-11-20 (×5): qty 1

## 2019-11-20 NOTE — Progress Notes (Signed)
PROGRESS NOTE    [name]  EHU:[id number] DOB: 02-09-1941 DOA: 11/19/2019 PCP: [name], MD    Brief Narrative:  Sabrina Baker is a 79 y.o. female with medical history significant of chronic kidney disease stage III, diabetes mellitus type 2, paroxysmal atrial fibrillation, hypercholesterolemia, hypertension was sent by her primary care today for treatment of facial cellulitis and abscess.     Consultants:   ID , ENT  Procedures:i/d in Er  Antimicrobials:   vanco   Subjective: Still with erythema and swelling of the face.  Has pain where the abscesses on her forehead.  Able to open her right eyes more today.  Objective: Vitals:   11/19/19 2024 11/20/19 0328 11/20/19 0647 11/20/19 0833  BP: (!) 157/59 (!) 153/68 (!) 153/68 (!) 172/64  Pulse: 86 75 75 80  Resp: 16 16 16 17   Temp: (!) 100.6 F (38.1 C) 99 F (37.2 C) 99 F (37.2 C) 99.3 F (37.4 C)  TempSrc: Oral Oral Oral Oral  SpO2: 95% 98%  94%    Intake/Output Summary (Last 24 hours) at 11/20/2019 1626 Last data filed at 11/20/2019 1400 Gross per 24 hour  Intake 1785.13 ml  Output --  Net 1785.13 ml   There were no vitals filed for this visit.  Examination:  General exam: Appears calm and comfortable, nad HEENT: facial swelling, erythema minimally improved, large fluctuant mid forehead, red,warm to touch. Able to open right eye a little today Respiratory system: Clear to auscultation. Respiratory effort normal. Cardiovascular system: S1 & S2 heard, RRR. No JVD, murmurs, rubs, gallops or clicks.  Gastrointestinal system: Abdomen is nondistended, soft and nontender.. Normal bowel sounds heard. Central nervous system: Alert and oriented.  Grossly intact Extremities: no edema Skin: Warm dry Psychiatry: Judgement and insight appear normal. Mood & affect appropriate.     Data Reviewed: I have personally reviewed following labs and imaging studies  CBC: Recent Labs  Lab 11/19/19 1326  11/20/19 0339  WBC 24.2* 24.0*  NEUTROABS 20.7*  --   HGB 12.1 11.6*  HCT 39.2 38.1  MCV 96.8 97.9  PLT 317 785   Basic Metabolic Panel: Recent Labs  Lab 11/19/19 1326 11/20/19 0339  NA 136 141  K 5.2* 5.1  CL 102 103  CO2 23 27  GLUCOSE 216* 46*  BUN 30* 23  CREATININE 1.61* 1.23*  CALCIUM 8.9 8.4*   GFR: CrCl cannot be calculated (Unknown ideal weight.). Liver Function Tests: Recent Labs  Lab 11/19/19 1326  AST 15  ALT 14  ALKPHOS 66  BILITOT 0.5  PROT 6.6  ALBUMIN 2.9*   No results for input(s): LIPASE, AMYLASE in the last 168 hours. No results for input(s): AMMONIA in the last 168 hours. Coagulation Profile: No results for input(s): INR, PROTIME in the last 168 hours. Cardiac Enzymes: No results for input(s): CKTOTAL, CKMB, CKMBINDEX, TROPONINI in the last 168 hours. BNP (last 3 results) No results for input(s): PROBNP in the last 8760 hours. HbA1C: Recent Labs    11/20/19 0339  HGBA1C 8.9*   CBG: Recent Labs  Lab 11/20/19 0031 11/20/19 0500 11/20/19 0827 11/20/19 1230  GLUCAP 260* 72 196* 274*   Lipid Profile: No results for input(s): CHOL, HDL, LDLCALC, TRIG, CHOLHDL, LDLDIRECT in the last 72 hours. Thyroid Function Tests: No results for input(s): TSH, T4TOTAL, FREET4, T3FREE, THYROIDAB in the last 72 hours. Anemia Panel: No results for input(s): VITAMINB12, FOLATE, FERRITIN, TIBC, IRON, RETICCTPCT in the last 72 hours. Sepsis Labs: Recent Labs  Lab 11/19/19 1326 11/19/19 1602  LATICACIDVEN 1.0 1.1    Recent Results (from the past 240 hour(s))  Culture, blood (routine x 2)     Status: None (Preliminary result)   Collection Time: 11/19/19  4:02 PM   Specimen: BLOOD  Result Value Ref Range Status   Specimen Description BLOOD RIGHT ANTECUBITAL  Final   Special Requests   Final    BOTTLES DRAWN AEROBIC AND ANAEROBIC Blood Culture adequate volume   Culture   Final    NO GROWTH < 24 HOURS Performed at [facility], [place]  [address] [place],  [place] [address]    Report Status PENDING  Incomplete  Culture, blood (routine x 2)     Status: None (Preliminary result)   Collection Time: 11/19/19  4:45 PM   Specimen: BLOOD  Result Value Ref Range Status   Specimen Description BLOOD SITE NOT SPECIFIED  Final   Special Requests   Final    BOTTLES DRAWN AEROBIC AND ANAEROBIC Blood Culture results may not be optimal due to an inadequate volume of blood received in culture bottles   Culture   Final    NO GROWTH < 24 HOURS Performed at [facility], [place] [address] [place], [place] [address]    Report Status PENDING  Incomplete  SARS Coronavirus 2 by RT PCR (hospital order, performed in [facility] hospital lab) Nasopharyngeal Urine, Clean Catch     Status: None   Collection Time: 11/19/19  4:45 PM   Specimen: Urine, Clean Catch; Nasopharyngeal  Result Value Ref Range Status   SARS Coronavirus 2 NEGATIVE NEGATIVE Final    Comment: (NOTE) SARS-CoV-2 target nucleic acids are NOT DETECTED. The SARS-CoV-2 RNA is generally detectable in upper and lower respiratory specimens during the acute phase of infection. The lowest concentration of SARS-CoV-2 viral copies this assay can detect is 250 copies / mL. A negative result does not preclude SARS-CoV-2 infection and should not be used as the sole basis for treatment or other patient management decisions.  A negative result may occur with improper specimen collection / handling, submission of specimen other than nasopharyngeal swab, presence of viral mutation(s) within the areas targeted by this assay, and inadequate number of viral copies (<250 copies / mL). A negative result must be combined with clinical observations, patient history, and epidemiological information. Fact Sheet for Patients:   StrictlyIdeas.no Fact Sheet for Healthcare Providers: BankingDealers.co.za This test is not yet approved or cleared  by the [place] FDA and has been authorized for detection and/or diagnosis of SARS-CoV-2 by FDA under an Emergency Use Authorization (EUA).  This EUA will remain in effect (meaning this test can be used) for the duration of the COVID-19 declaration under Section 564(b)(1) of the Act, 21 U.S.C. section 360bbb-3(b)(1), unless the authorization is terminated or revoked sooner. Performed at [facility], [place] [address] [place], [place] [address]   Wound or Superficial Culture     Status: None (Preliminary result)   Collection Time: 11/19/19  5:40 PM   Specimen: Wound  Result Value Ref Range Status   Specimen Description WOUND  Final   Special Requests FOREHEAD  Final   Gram Stain   Final    RARE WBC PRESENT, PREDOMINANTLY PMN FEW GRAM POSITIVE COCCI IN PAIRS IN CLUSTERS    Culture   Final    FEW STAPHYLOCOCCUS AUREUS CULTURE REINCUBATED FOR BETTER GROWTH Performed at [facility], [place] [address], [place], [place] [address]  Report Status PENDING  Incomplete         Radiology Studies: No results found.      Scheduled Meds:  atorvastatin  10 mg Oral QHS   DULoxetine  60 mg Oral Daily   ferrous sulfate  325 mg Oral TID WC   gabapentin  100 mg Oral QHS   insulin aspart  0-15 Units Subcutaneous Q4H   insulin glargine  5 Units Subcutaneous QHS   ketoconazole  1 application Topical Once per day on Mon Thu   latanoprost  1 drop Left Eye QHS   magnesium oxide  400 mg Oral Daily   pantoprazole  40 mg Oral BID   Continuous Infusions:  sodium chloride 75 mL/hr at 11/20/19 1400   vancomycin      Assessment & Plan:   Principal Problem:   Staph aureus infection Active Problems:   Cellulitis  1.facial cellulitis with abscess-secondary to using razor to cut her hair Fever with elevated WBC Little status post I/D in ER, wound cx stop for [place] final culture pending Spoken to Dr. Wilburn Cornelia from ENT to see patient today\ Consulted ID input was  appreciated-continuing vancomycin and will need I&D Hold Eliquis for further I/D    2.DM Type2 Hold home dose medication Start with RISS, fs Add lantus 5 Units tonight Ck HA1c  3.Acute on CKD stage III-creatinine mildly elevated. Possibly prerenal as she has been having fevers Improving with creatinine of 1.23 today. Continue gentle hydration. Renal function  4. PAF- on beta blk and eliquis Will hole a/c for possible procedure for above Hold coreg today, in case pt's bp drops as she is febrile, wbc. If stable can resume starting with low dose in am  5.HTN- bp meds held initially due to low bp now improved, leg bilateral lower dose, Coreg 12.5 mg p.o. twice daily increased to 25 mg home dose as tolerated    6.Hyperkalemia-mild. Likely from #3. Start IVF was give kayexalate15gm x1 on 6/9 K now 5/1. Continue to monitor  6.  Hypercholesterolemia-continue home statin    DVT prophylaxis: Eiquis.Marland Kitchen> now SCD Code Status: full  Family Communication: by bedside Disposition Plan: back home when stable  Barrier: Patient currently not medically stable for discharge as she requires IV antibiotics, febrile, and will need I&D by ENT. Anticipated discharge date more than 2 days      LOS: 1 day   Time spent: 45 min with >50% on coc    Nolberto Hanlon, MD [facility] Pager [phone]  If 7PM-7AM, please contact night-coverage [email or web address] Password [facility] 11/20/2019, 4:26 PM

## 2019-11-20 NOTE — Consult Note (Signed)
[facility] for Infectious Disease    Date of Admission:  11/19/2019     Total days of antibiotics                Reason for Consult: Cellulitis  Referring Provider: Kurtis Bushman Primary Care Provider: [name], MD   ASSESSMENT:  Ms. Malanowski is a 79 y/o female with Staphylococcus aureus facial abscess and cellulitis as a result of a razor nick while shaving. Specimen sensitivities are pending. Agree with discontinuing ceftriaxone and continuing vancomycin pending sensitivity results. Awaiting possible surgical consult. Discussed importance of good blood sugar control as this can lead to delayed/complicated healing and increased risk of infection.   PLAN:  1. Agree with vancomycin. 2. Await surgical interventions if needed.  3. Monitor cultures for sensitivities to narrow antibiotics as able. 4. Diabetes care per primary team.    Principal Problem:   Staph aureus infection Active Problems:   Cellulitis   . atorvastatin  10 mg Oral QHS  . DULoxetine  60 mg Oral Daily  . ferrous sulfate  325 mg Oral TID WC  . gabapentin  100 mg Oral QHS  . insulin aspart  0-15 Units Subcutaneous Q4H  . insulin glargine  5 Units Subcutaneous QHS  . ketoconazole  1 application Topical Once per day on Mon Thu  . latanoprost  1 drop Left Eye QHS  . magnesium oxide  400 mg Oral Daily  . pantoprazole  40 mg Oral BID     HPI: LYSANDRA LOUGHMILLER is a 79 y.o. female with previous medical history of Type 2 diabetes, hypertension, TIA, and CKD Stage 3 admitted with facial swelling and tenderness.   Ms. Bale was shaving her forehead with a new razor and nicked her skin a couple of days prior to arrival. Has had worsening swelling, redness and tenderness develop since then. Seen her PCP office and noted to have fever and was referred to ED.   Elevated temperature of 100.2 in the ED with hypertension. Significant lab work with hyperkalemia with potassium of 5.2, glucose 216, leukocytosis with WBC  count of 24.2, and creatinine of 1.61 (baseline 1.0-1.3; most recent 1.2). Incision and drainage was performed with a scant amount of purulent drainage.   Ms. Dattilio has continued to have slightly elevated temperatures since admission and was started on vancomycin and ceftriaxone. Blood cultures have been without growth to date. Wound specimen obtained from I&D with gram stain with few gram positive cocci and culture with few staphylococcus aureus.     Review of Systems: Review of Systems  Constitutional: Negative for chills, fever and weight loss.  Respiratory: Negative for cough, shortness of breath and wheezing.   Cardiovascular: Negative for chest pain and leg swelling.  Gastrointestinal: Negative for abdominal pain, constipation, diarrhea, nausea and vomiting.  Skin: Negative for rash.     Past Medical History:  Diagnosis Date  . Chronic kidney disease    stage 3  . Diabetes mellitus without complication ([facility])   . Hypercholesteremia   . Hypertension   . Neuromuscular disorder (HCC)    neuropathy  . Osteopenia   . Retinal detachment   . TIA (transient ischemic attack)   . Vitamin B12 deficiency     Social History   Tobacco Use  . Smoking status: Never Smoker  . Smokeless tobacco: Never Used  Vaping Use  . Vaping Use: Never used  Substance Use Topics  . Alcohol use: No  . Drug use: No  No family history on file.  Allergies  Allergen Reactions  . Altace [Ramipril] Anaphylaxis  . Cortisone Itching and Rash  . Diovan [Valsartan] Other (See Comments)    Hyperkalemia   . Niacin Diarrhea    And nausea  . Niacin And Related Diarrhea    OBJECTIVE: Blood pressure (!) 172/64, pulse 80, temperature 99.3 F (37.4 C), temperature source Oral, resp. rate 17, SpO2 94 %.  Physical Exam Constitutional:      General: She is not in acute distress.    Appearance: She is well-developed.  HENT:     Head:     Comments: There is grape sized lesion located on the forehead  near the hairline consistent with abscess. Generalized facial swelling.  Cardiovascular:     Rate and Rhythm: Normal rate and regular rhythm.     Heart sounds: Normal heart sounds.  Pulmonary:     Effort: Pulmonary effort is normal.     Breath sounds: Normal breath sounds.  Skin:    General: Skin is warm and dry.  Neurological:     Mental Status: She is alert and oriented to person, place, and time.  Psychiatric:        Behavior: Behavior normal.        Thought Content: Thought content normal.        Judgment: Judgment normal.     Lab Results Lab Results  Component Value Date   WBC 24.0 (H) 11/20/2019   HGB 11.6 (L) 11/20/2019   HCT 38.1 11/20/2019   MCV 97.9 11/20/2019   PLT 283 11/20/2019    Lab Results  Component Value Date   CREATININE 1.23 (H) 11/20/2019   BUN 23 11/20/2019   NA 141 11/20/2019   K 5.1 11/20/2019   CL 103 11/20/2019   CO2 27 11/20/2019    Lab Results  Component Value Date   ALT 14 11/19/2019   AST 15 11/19/2019   ALKPHOS 66 11/19/2019   BILITOT 0.5 11/19/2019     Microbiology: Recent Results (from the past 240 hour(s))  Culture, blood (routine x 2)     Status: None (Preliminary result)   Collection Time: 11/19/19  4:02 PM   Specimen: BLOOD  Result Value Ref Range Status   Specimen Description BLOOD RIGHT ANTECUBITAL  Final   Special Requests   Final    BOTTLES DRAWN AEROBIC AND ANAEROBIC Blood Culture adequate volume   Culture   Final    NO GROWTH < 24 HOURS Performed at [facility], [address] [place], [place] [address]    Report Status PENDING  Incomplete  Culture, blood (routine x 2)     Status: None (Preliminary result)   Collection Time: 11/19/19  4:45 PM   Specimen: BLOOD  Result Value Ref Range Status   Specimen Description BLOOD SITE NOT SPECIFIED  Final   Special Requests   Final    BOTTLES DRAWN AEROBIC AND ANAEROBIC Blood Culture results may not be optimal due to an inadequate volume of blood received in  culture bottles   Culture   Final    NO GROWTH < 24 HOURS Performed at [facility], [place] [address], [place],  [address]    Report Status PENDING  Incomplete  SARS Coronavirus 2 by RT PCR (hospital order, performed in [facility] hospital lab) Nasopharyngeal Urine, Clean Catch     Status: None   Collection Time: 11/19/19  4:45 PM   Specimen: Urine, Clean Catch; Nasopharyngeal  Result  Value Ref Range Status   SARS Coronavirus 2 NEGATIVE NEGATIVE Final    Comment: (NOTE) SARS-CoV-2 target nucleic acids are NOT DETECTED. The SARS-CoV-2 RNA is generally detectable in upper and lower respiratory specimens during the acute phase of infection. The lowest concentration of SARS-CoV-2 viral copies this assay can detect is 250 copies / mL. A negative result does not preclude SARS-CoV-2 infection and should not be used as the sole basis for treatment or other patient management decisions.  A negative result may occur with improper specimen collection / handling, submission of specimen other than nasopharyngeal swab, presence of viral mutation(s) within the areas targeted by this assay, and inadequate number of viral copies (<250 copies / mL). A negative result must be combined with clinical observations, patient history, and epidemiological information. Fact Sheet for Patients:   StrictlyIdeas.no Fact Sheet for Healthcare Providers: BankingDealers.co.za This test is not yet approved or cleared  by the [place] FDA and has been authorized for detection and/or diagnosis of SARS-CoV-2 by FDA under an Emergency Use Authorization (EUA).  This EUA will remain in effect (meaning this test can be used) for the duration of the COVID-19 declaration under Section 564(b)(1) of the Act, 21 U.S.C. section 360bbb-3(b)(1), unless the authorization is terminated or revoked sooner. Performed at [facility], [place] [address] [place],  [place] [address]   Wound or Superficial Culture     Status: None (Preliminary result)   Collection Time: 11/19/19  5:40 PM   Specimen: Wound  Result Value Ref Range Status   Specimen Description WOUND  Final   Special Requests FOREHEAD  Final   Gram Stain   Final    RARE WBC PRESENT, PREDOMINANTLY PMN FEW GRAM POSITIVE COCCI IN PAIRS IN CLUSTERS    Culture   Final    FEW STAPHYLOCOCCUS AUREUS CULTURE REINCUBATED FOR BETTER GROWTH Performed at [facility], [place] [address] [place], [place] [address]    Report Status PENDING  Incomplete     Terri Piedra, [facility]  11/20/2019  1:57 PM

## 2019-11-20 NOTE — Progress Notes (Signed)
Inpatient Diabetes Program Recommendations  AACE/ADA: New Consensus Statement on Inpatient Glycemic Control   Target Ranges:  Prepandial:   less than 140 mg/dL      Peak postprandial:   less than 180 mg/dL (1-2 hours)      Critically ill patients:  140 - 180 mg/dL   Results for GRACEANNE, GUIN (MRN [id number]) as of 11/20/2019 08:47  Ref. Range 11/19/2019 22:12 11/20/2019 00:31 11/20/2019 05:00  Glucose-Capillary Latest Ref Range: 70 - 99 mg/dL     Lantus 5 units 260 (H)  Novolog 8 units @ 00:36 72   Review of Glycemic Control  Diabetes history: DM2 Outpatient Diabetes medications: Toujeo 14-15 units daily, Trulicity 1.5 mg Qweek Current orders for Inpatient glycemic control: Lantus 5 units QHS, Novolog 0-15 units Q4H  Inpatient Diabetes Program Recommendations:   Correction (SSI): Please consider changing CBGs to AC&HS and Novolog to 0-15 units TID with meals and Novolog 0-5 units QHS.  NOTE: Noted glucose 72 mg/dl this morning at 5:00 am, likely due to Novolog 8 units given at 00:36. Would not recommend making any changes with basal insulin. If patient is eating, would recommend changing CBGs to AC&HS and Novolog to 0-15 units TID with meals and Novolog 0-5 units QHS.  Thanks, Barnie Alderman, RN, MSN, CDE Diabetes Coordinator Inpatient Diabetes Program [phone] (Team Pager from 8am to 5pm)

## 2019-11-20 NOTE — Plan of Care (Addendum)
0320 BP 177/72, was not notified, retook BP as soon as I saw elevated BP, new BP 150/65. Pt states that's her normal and she feel fine.     Problem: Education: Goal: Knowledge of General Education information will improve Description: Including pain rating scale, medication(s)/side effects and non-pharmacologic comfort measures Outcome: Progressing   Problem: Activity: Goal: Risk for activity intolerance will decrease Outcome: Progressing   Problem: Pain Managment: Goal: General experience of comfort will improve Outcome: Progressing   Problem: Safety: Goal: Ability to remain free from injury will improve Outcome: Progressing   Problem: Skin Integrity: Goal: Risk for impaired skin integrity will decrease Outcome: Progressing   Problem: Education: Goal: Knowledge of General Education information will improve Description: Including pain rating scale, medication(s)/side effects and non-pharmacologic comfort measures Outcome: Progressing

## 2019-11-20 NOTE — Consult Note (Signed)
ENT CONSULT:  Reason for Consult: Facial cellulitis and forehead abscess Referring Physician: Hospital service  Sabrina Baker is an 79 y.o. female.  HPI: Patient admitted to [facility] via the emergency department on 11/19/2019.  Patient has significant medical issues including type 2 diabetes, hypertension and chronic renal disease.  She was seen by her primary care Dr. Dema Severin and concerns raised regarding progressive swelling in the forehead with erythema and tenderness.  Thanks reports shaving her brows and perhaps making the skin with the developed increasing swelling and pain in the forehead.  Patient has an area of concern behind the right ear.  Patient went to the medical service for IV antibiotics and management of her medical issues including diabetes after discussion with [facility] physician.  Patient seen in follow-up for further management of ongoing infection.  ER physician performed a needle aspiration and findings were consistent with staph aureus infection.  Past Medical History:  Diagnosis Date  . Chronic kidney disease    stage 3  . Diabetes mellitus without complication ([facility])   . Hypercholesteremia   . Hypertension   . Neuromuscular disorder (HCC)    neuropathy  . Osteopenia   . Retinal detachment   . TIA (transient ischemic attack)   . Vitamin B12 deficiency     Past Surgical History:  Procedure Laterality Date  . [facility] VITRECTOMY WITH 20 GAUGE MVR PORT Right 03/16/2015   Procedure: 25 GAUGE PARS PLANA VITRECTOMY WITH 23 GAUGE MVR PORT;  Surgeon: Hayden Pedro, MD;  Location: [facility];  Service: Ophthalmology;  Laterality: Right;  . ANKLE SURGERY    . BREAST EXCISIONAL BIOPSY    . BREAST LUMPECTOMY WITH RADIOACTIVE SEED LOCALIZATION Right 01/24/2016   Procedure: RIGHT BREAST LUMPECTOMY WITH RADIOACTIVE SEED LOCALIZATION;  Surgeon: Coralie Keens, MD;  Location: [facility];  Service: General;  Laterality: Right;  . EYE  SURGERY     detached retna X2  . HEEL SPUR SURGERY  2012  . INJECTION OF SILICONE OIL Right 39/12/6732   Procedure: Extraction OF SILICONE OIL;  Surgeon: Hayden Pedro, MD;  Location: [facility];  Service: Ophthalmology;  Laterality: Right;  . ORIF ANKLE FRACTURE Left 06/17/2014   Procedure: OPEN REDUCTION INTERNAL FIXATION (ORIF) LEFT BIMALLEOLAR ANKLE FRACTURE;  Surgeon: Marianna Payment, MD;  Location: [facility];  Service: Orthopedics;  Laterality: Left;  . PHOTOCOAGULATION WITH LASER Right 03/16/2015   Procedure: PHOTOCOAGULATION WITH LASER;  Surgeon: Hayden Pedro, MD;  Location: [facility];  Service: Ophthalmology;  Laterality: Right;  . SHOULDER ARTHROSCOPY Right    plates and screws  . VITRECTOMY Right 03/16/2015  . WRIST ARTHROPLASTY Left    plates and screws and bone graft    No family history on file.  Social History:  reports that she has never smoked. She has never used smokeless tobacco. She reports that she does not drink alcohol and does not use drugs.  Allergies:  Allergies  Allergen Reactions  . Altace [Ramipril] Anaphylaxis  . Cortisone Itching and Rash  . Diovan [Valsartan] Other (See Comments)    Hyperkalemia   . Niacin Diarrhea    And nausea  . Niacin And Related Diarrhea    Medications: I have reviewed the patient's current medications.  Results for orders placed or performed during the hospital encounter of 11/19/19 (from the past 48 hour(s))  Lactic acid, plasma     Status: None   Collection Time: 11/19/19  1:26 PM  Result Value Ref Range   Lactic Acid, Venous 1.0 0.5 - 1.9 mmol/L    Comment: Performed at [facility] [address] [place], [place] [address]  Comprehensive metabolic panel     Status: Abnormal   Collection Time: 11/19/19  1:26 PM  Result Value Ref Range   Sodium 136 135 - 145 mmol/L   Potassium 5.2 (H) 3.5 - 5.1 mmol/L   Chloride 102 98 - 111 mmol/L   CO2 23 22 - 32 mmol/L   Glucose, Bld 216 (H) 70 - 99 mg/dL    Comment: Glucose  reference range applies only to samples taken after fasting for at least 8 hours.   BUN 30 (H) 8 - 23 mg/dL   Creatinine, Ser 1.61 (H) 0.44 - 1.00 mg/dL   Calcium 8.9 8.9 - 10.3 mg/dL   Total Protein 6.6 6.5 - 8.1 g/dL   Albumin 2.9 (L) 3.5 - 5.0 g/dL   AST 15 15 - 41 U/L   ALT 14 0 - 44 U/L   Alkaline Phosphatase 66 38 - 126 U/L   Total Bilirubin 0.5 0.3 - 1.2 mg/dL   GFR calc non Af Amer 30 (L) >60 mL/min   GFR calc Af Amer 35 (L) >60 mL/min   Anion gap 11 5 - 15    Comment: Performed at [facility] [address], [place], [place] [address]  CBC with Differential     Status: Abnormal   Collection Time: 11/19/19  1:26 PM  Result Value Ref Range   WBC 24.2 (H) 4.0 - 10.5 K/uL   RBC 4.05 3.87 - 5.11 MIL/uL   Hemoglobin 12.1 12.0 - 15.0 g/dL   HCT 39.2 36 - 46 %   MCV 96.8 80.0 - 100.0 fL   MCH 29.9 26.0 - 34.0 pg   MCHC 30.9 30.0 - 36.0 g/dL   RDW 15.9 (H) 11.5 - 15.5 %   Platelets 317 150 - 400 K/uL   nRBC 0.0 0.0 - 0.2 %   Neutrophils Relative % 85 %   Neutro Abs 20.7 (H) 1.7 - 7.7 K/uL   Lymphocytes Relative 7 %   Lymphs Abs 1.8 0.7 - 4.0 K/uL   Monocytes Relative 6 %   Monocytes Absolute 1.4 (H) 0 - 1 K/uL   Eosinophils Relative 1 %   Eosinophils Absolute 0.1 0 - 0 K/uL   Basophils Relative 0 %   Basophils Absolute 0.1 0 - 0 K/uL   Immature Granulocytes 1 %   Abs Immature Granulocytes 0.12 (H) 0.00 - 0.07 K/uL    Comment: Performed at [facility], [address], [place], [place] [address]  Lactic acid, plasma     Status: None   Collection Time: 11/19/19  4:02 PM  Result Value Ref Range   Lactic Acid, Venous 1.1 0.5 - 1.9 mmol/L    Comment: Performed at [facility] [address], [place], [place] [address]  Culture, blood (routine x 2)     Status: None (Preliminary result)   Collection Time: 11/19/19  4:02 PM   Specimen: BLOOD  Result Value Ref Range   Specimen Description BLOOD RIGHT ANTECUBITAL    Special Requests      BOTTLES DRAWN  AEROBIC AND ANAEROBIC Blood Culture adequate volume   Culture      NO GROWTH < 24 HOURS Performed at [facility], [place] [address] [place], [place] [address]    Report Status PENDING  Urinalysis, Routine w reflex microscopic     Status: Abnormal   Collection Time: 11/19/19  4:45 PM  Result Value Ref Range   Color, Urine YELLOW YELLOW   APPearance CLEAR CLEAR   Specific Gravity, Urine 1.009 1.005 - 1.030   pH 5.0 5.0 - 8.0   Glucose, UA NEGATIVE NEGATIVE mg/dL   Hgb urine dipstick NEGATIVE NEGATIVE   Bilirubin Urine NEGATIVE NEGATIVE   Ketones, ur NEGATIVE NEGATIVE mg/dL   Protein, ur NEGATIVE NEGATIVE mg/dL   Nitrite NEGATIVE NEGATIVE   Leukocytes,Ua LARGE (A) NEGATIVE   RBC / HPF 0-5 0 - 5 RBC/hpf   WBC, UA 11-20 0 - 5 WBC/hpf   Bacteria, UA RARE (A) NONE SEEN   Squamous Epithelial / LPF 0-5 0 - 5    Comment: Performed at [facility] [address], [place], [place] [address]  Culture, blood (routine x 2)     Status: None (Preliminary result)   Collection Time: 11/19/19  4:45 PM   Specimen: BLOOD  Result Value Ref Range   Specimen Description BLOOD SITE NOT SPECIFIED    Special Requests      BOTTLES DRAWN AEROBIC AND ANAEROBIC Blood Culture results may not be optimal due to an inadequate volume of blood received in culture bottles   Culture      NO GROWTH < 24 HOURS Performed at [facility] [address] [place], [place] [address]    Report Status PENDING   SARS Coronavirus 2 by RT PCR (hospital order, performed in [facility] hospital lab) Nasopharyngeal Urine, Clean Catch     Status: None   Collection Time: 11/19/19  4:45 PM   Specimen: Urine, Clean Catch; Nasopharyngeal  Result Value Ref Range   SARS Coronavirus 2 NEGATIVE NEGATIVE    Comment: (NOTE) SARS-CoV-2 target nucleic acids are NOT DETECTED. The SARS-CoV-2 RNA is generally detectable in upper and lower respiratory specimens during the acute phase of infection. The lowest concentration  of SARS-CoV-2 viral copies this assay can detect is 250 copies / mL. A negative result does not preclude SARS-CoV-2 infection and should not be used as the sole basis for treatment or other patient management decisions.  A negative result may occur with improper specimen collection / handling, submission of specimen other than nasopharyngeal swab, presence of viral mutation(s) within the areas targeted by this assay, and inadequate number of viral copies (<250 copies / mL). A negative result must be combined with clinical observations, patient history, and epidemiological information. Fact Sheet for Patients:   StrictlyIdeas.no Fact Sheet for Healthcare Providers: BankingDealers.co.za This test is not yet approved or cleared  by the [place] FDA and has been authorized for detection and/or diagnosis of SARS-CoV-2 by FDA under an Emergency Use Authorization (EUA).  This EUA will remain in effect (meaning this test can be used) for the duration of the COVID-19 declaration under Section 564(b)(1) of the Act, 21 U.S.C. section 360bbb-3(b)(1), unless the authorization is terminated or revoked sooner. Performed at [facility], [place] [address] [place], [place] [address]   Wound or Superficial Culture     Status: None (Preliminary result)   Collection Time: 11/19/19  5:40 PM   Specimen: Wound  Result Value Ref Range   Specimen Description WOUND    Special Requests FOREHEAD    Gram Stain      RARE WBC PRESENT, PREDOMINANTLY PMN FEW GRAM POSITIVE COCCI IN PAIRS IN CLUSTERS    Culture  FEW STAPHYLOCOCCUS AUREUS CULTURE REINCUBATED FOR BETTER GROWTH Performed at [facility], [place] [address], [place], [place] [address]    Report Status PENDING   Glucose, capillary     Status: Abnormal   Collection Time: 11/20/19 12:31 AM  Result Value Ref Range   Glucose-Capillary 260 (H) 70 - 99 mg/dL    Comment: Glucose reference  range applies only to samples taken after fasting for at least 8 hours.   Comment 1 Notify RN   Basic metabolic panel     Status: Abnormal   Collection Time: 11/20/19  3:39 AM  Result Value Ref Range   Sodium 141 135 - 145 mmol/L   Potassium 5.1 3.5 - 5.1 mmol/L   Chloride 103 98 - 111 mmol/L   CO2 27 22 - 32 mmol/L   Glucose, Bld 46 (L) 70 - 99 mg/dL    Comment: Glucose reference range applies only to samples taken after fasting for at least 8 hours.   BUN 23 8 - 23 mg/dL   Creatinine, Ser 1.23 (H) 0.44 - 1.00 mg/dL   Calcium 8.4 (L) 8.9 - 10.3 mg/dL   GFR calc non Af Amer 42 (L) >60 mL/min   GFR calc Af Amer 49 (L) >60 mL/min   Anion gap 11 5 - 15    Comment: Performed at [facility] [address] [place],  [address]  CBC     Status: Abnormal   Collection Time: 11/20/19  3:39 AM  Result Value Ref Range   WBC 24.0 (H) 4.0 - 10.5 K/uL   RBC 3.89 3.87 - 5.11 MIL/uL   Hemoglobin 11.6 (L) 12.0 - 15.0 g/dL   HCT 38.1 36 - 46 %   MCV 97.9 80.0 - 100.0 fL   MCH 29.8 26.0 - 34.0 pg   MCHC 30.4 30.0 - 36.0 g/dL   RDW 15.9 (H) 11.5 - 15.5 %   Platelets 283 150 - 400 K/uL   nRBC 0.0 0.0 - 0.2 %    Comment: Performed at [facility], [place] [address], [place],  [address]  Hemoglobin A1c     Status: Abnormal   Collection Time: 11/20/19  3:39 AM  Result Value Ref Range   Hgb A1c MFr Bld 8.9 (H) 4.8 - 5.6 %    Comment: (NOTE) Pre diabetes:          5.7%-6.4%  Diabetes:              >6.4%  Glycemic control for   <7.0% adults with diabetes    Mean Plasma Glucose 208.73 mg/dL    Comment: Performed at [facility] [address] [place], [place] [address]  Glucose, capillary     Status: None   Collection Time: 11/20/19  5:00 AM  Result Value Ref Range   Glucose-Capillary 72 70 - 99 mg/dL    Comment: Glucose reference range applies only to samples taken after fasting for at least 8 hours.  Glucose, capillary     Status: Abnormal   Collection Time:  11/20/19  8:27 AM  Result Value Ref Range   Glucose-Capillary 196 (H) 70 - 99 mg/dL    Comment: Glucose reference range applies only to samples taken after fasting for at least 8 hours.  Glucose, capillary     Status: Abnormal   Collection Time: 11/20/19 12:30 PM  Result Value Ref Range   Glucose-Capillary 274 (H) 70 - 99 mg/dL    Comment: Glucose  reference range applies only to samples taken after fasting for at least 8 hours.    No results found.  ROS:ROS 12 systems reviewed and negative except as stated in HPI   Blood pressure (!) 172/64, pulse 80, temperature 99.3 F (37.4 C), temperature source Oral, resp. rate 17, SpO2 94 %.  PHYSICAL EXAM: General appearance - alert, well appearing, and in no distress Eyes - pupils equal and reactive, extraocular eye movements intact, moderate bilateral periorbital erythema and edema of the upper eyelids. Nose - normal and patent, no erythema, discharge or polyps Neck - supple, no significant adenopathy   Studies Reviewed:CT Pending  Assessment/Plan: Patient with acute swelling and erythema of the forehead and right post-auricular area. C/S c/w staph infection. Cont current antibiotics and medical tx. Review CT from this pm. Plan I&D of abscesses in am.  Jerrell Belfast 11/20/2019, 4:31 PM

## 2019-11-20 NOTE — Plan of Care (Signed)

## 2019-11-21 ENCOUNTER — Encounter (HOSPITAL_COMMUNITY)
Admission: EM | Disposition: A | Discharge status: Home / Self Care | Payer: Self-pay | Source: Home / Self Care | Attending: Internal Medicine

## 2019-11-21 ENCOUNTER — Encounter (HOSPITAL_COMMUNITY): Payer: Self-pay | Admitting: Internal Medicine

## 2019-11-21 ENCOUNTER — Inpatient Hospital Stay (HOSPITAL_COMMUNITY): Payer: Medicare Other | Admitting: Anesthesiology

## 2019-11-21 HISTORY — PX: INCISION AND DRAINAGE ABSCESS: SHX5864

## 2019-11-21 LAB — GLUCOSE, CAPILLARY
Glucose-Capillary: 125 mg/dL — ABNORMAL HIGH (ref 70–99)
Glucose-Capillary: 144 mg/dL — ABNORMAL HIGH (ref 70–99)
Glucose-Capillary: 151 mg/dL — ABNORMAL HIGH (ref 70–99)
Glucose-Capillary: 205 mg/dL — ABNORMAL HIGH (ref 70–99)
Glucose-Capillary: 292 mg/dL — ABNORMAL HIGH (ref 70–99)
Glucose-Capillary: 355 mg/dL — ABNORMAL HIGH (ref 70–99)
Glucose-Capillary: 64 mg/dL — ABNORMAL LOW (ref 70–99)
Glucose-Capillary: 89 mg/dL (ref 70–99)

## 2019-11-21 LAB — CBC
HCT: 39.4 % (ref 36.0–46.0)
Hemoglobin: 11.9 g/dL — ABNORMAL LOW (ref 12.0–15.0)
MCH: 29.6 pg (ref 26.0–34.0)
MCHC: 30.2 g/dL (ref 30.0–36.0)
MCV: 98 fL (ref 80.0–100.0)
Platelets: 311 10*3/uL (ref 150–400)
RBC: 4.02 MIL/uL (ref 3.87–5.11)
RDW: 15.9 % — ABNORMAL HIGH (ref 11.5–15.5)
WBC: 20.1 10*3/uL — ABNORMAL HIGH (ref 4.0–10.5)
nRBC: 0 % (ref 0.0–0.2)

## 2019-11-21 LAB — BASIC METABOLIC PANEL
Anion gap: 9 (ref 5–15)
BUN: 15 mg/dL (ref 8–23)
CO2: 25 mmol/L (ref 22–32)
Calcium: 8.4 mg/dL — ABNORMAL LOW (ref 8.9–10.3)
Chloride: 105 mmol/L (ref 98–111)
Creatinine, Ser: 1.03 mg/dL — ABNORMAL HIGH (ref 0.44–1.00)
GFR calc Af Amer: >60 mL/min (ref >60)
GFR calc non Af Amer: 52 mL/min — ABNORMAL LOW (ref >60)
Glucose, Bld: 65 mg/dL — ABNORMAL LOW (ref 70–99)
Potassium: 4.7 mmol/L (ref 3.5–5.1)
Sodium: 139 mmol/L (ref 135–145)

## 2019-11-21 SURGERY — INCISION AND DRAINAGE, ABSCESS
Anesthesia: General | Laterality: Right

## 2019-11-21 MED ORDER — FENTANYL CITRATE (PF) 250 MCG/5ML IJ SOLN
INTRAMUSCULAR | Status: AC
  Filled 2019-11-21: qty 5

## 2019-11-21 MED ORDER — FENTANYL CITRATE (PF) 100 MCG/2ML IJ SOLN
INTRAMUSCULAR | Status: DC | PRN
  Administered 2019-11-21: 25 ug via INTRAVENOUS
  Administered 2019-11-21: 150 ug via INTRAVENOUS

## 2019-11-21 MED ORDER — LIDOCAINE 2% (20 MG/ML) 5 ML SYRINGE
INTRAMUSCULAR | Status: DC | PRN
  Administered 2019-11-21: 50 mg via INTRAVENOUS

## 2019-11-21 MED ORDER — LACTATED RINGERS IV SOLN: INTRAVENOUS | Status: DC | PRN

## 2019-11-21 MED ORDER — DEXAMETHASONE SODIUM PHOSPHATE 10 MG/ML IJ SOLN
INTRAMUSCULAR | Status: DC | PRN
  Administered 2019-11-21: 4 mg via INTRAVENOUS

## 2019-11-21 MED ORDER — LACTATED RINGERS IV SOLN: INTRAVENOUS | Status: DC

## 2019-11-21 MED ORDER — CHLORHEXIDINE GLUCONATE 0.12 % MT SOLN
OROMUCOSAL | Status: AC
  Administered 2019-11-21: 15 mL via OROMUCOSAL
  Filled 2019-11-21: qty 15

## 2019-11-21 MED ORDER — MIDAZOLAM HCL 2 MG/2ML IJ SOLN
INTRAMUSCULAR | Status: DC | PRN
  Administered 2019-11-21: .5 mg via INTRAVENOUS

## 2019-11-21 MED ORDER — PROPOFOL 10 MG/ML IV BOLUS
INTRAVENOUS | Status: DC | PRN
  Administered 2019-11-21: 100 mg via INTRAVENOUS

## 2019-11-21 MED ORDER — MIDAZOLAM HCL 2 MG/2ML IJ SOLN
INTRAMUSCULAR | Status: AC
  Filled 2019-11-21: qty 2

## 2019-11-21 MED ORDER — SUCCINYLCHOLINE CHLORIDE 200 MG/10ML IV SOSY
PREFILLED_SYRINGE | INTRAVENOUS | Status: DC | PRN
  Administered 2019-11-21: 120 mg via INTRAVENOUS

## 2019-11-21 MED ORDER — ORAL CARE MOUTH RINSE: 15.0000 mL | Freq: Once | OROMUCOSAL | Status: AC

## 2019-11-21 MED ORDER — ONDANSETRON HCL 4 MG/2ML IJ SOLN
INTRAMUSCULAR | Status: DC | PRN
  Administered 2019-11-21: 4 mg via INTRAVENOUS

## 2019-11-21 MED ORDER — CHLORHEXIDINE GLUCONATE 0.12 % MT SOLN: 15.0000 mL | Freq: Once | OROMUCOSAL | Status: AC

## 2019-11-21 MED ORDER — VANCOMYCIN HCL 1250 MG/250ML IV SOLN
1250.0000 mg | INTRAVENOUS | Status: DC
  Filled 2019-11-21 (×2): qty 250

## 2019-11-21 SURGICAL SUPPLY — 22 items
BNDG GAUZE ELAST 4 BULKY (GAUZE/BANDAGES/DRESSINGS) ×1 IMPLANT
CANISTER SUCT 3000ML PPV (MISCELLANEOUS) ×2 IMPLANT
COVER SURGICAL LIGHT HANDLE (MISCELLANEOUS) ×2 IMPLANT
DRAPE HALF SHEET 40X57 (DRAPES) IMPLANT
ELECT REM PT RETURN 9FT ADLT (ELECTROSURGICAL) ×2
ELECTRODE REM PT RTRN 9FT ADLT (ELECTROSURGICAL) ×1 IMPLANT
GAUZE SPONGE 4X4 12PLY STRL LF (GAUZE/BANDAGES/DRESSINGS) ×1 IMPLANT
GLOVE BIOGEL M 7.0 STRL (GLOVE) ×4 IMPLANT
GOWN STRL REUS W/ TWL LRG LVL3 (GOWN DISPOSABLE) ×2 IMPLANT
GOWN STRL REUS W/TWL LRG LVL3 (GOWN DISPOSABLE) ×4
KIT BASIN OR (CUSTOM PROCEDURE TRAY) ×2 IMPLANT
KIT TURNOVER KIT B (KITS) ×2 IMPLANT
LOCATOR NERVE 3 VOLT (DISPOSABLE) IMPLANT
NS IRRIG 1000ML POUR BTL (IV SOLUTION) ×2 IMPLANT
SUT ETHILON 3 0 PS 1 (SUTURE) IMPLANT
SUT ETHILON 4 0 PS 2 18 (SUTURE) ×1 IMPLANT
SUT ETHILON 5 0 PS 2 18 (SUTURE) IMPLANT
SUT VIC AB 3-0 PS2 18 (SUTURE)
SUT VIC AB 3-0 PS2 18XBRD (SUTURE) IMPLANT
SUT VIC AB 4-0 P-3 18X BRD (SUTURE) IMPLANT
SUT VIC AB 4-0 P3 18 (SUTURE)
TOWEL GREEN STERILE FF (TOWEL DISPOSABLE) ×2 IMPLANT

## 2019-11-21 NOTE — Progress Notes (Signed)
PROGRESS NOTE    Sabrina Baker  LKG:[id number] DOB: [birth year] DOA: 11/19/2019 PCP: [name], MD    Brief Narrative:  Sabrina Baker is a 79 y.o. female with medical history significant of chronic kidney disease stage III, diabetes mellitus type 2, paroxysmal atrial fibrillation, hypercholesterolemia, hypertension was sent by her primary care today for treatment of facial cellulitis and abscess.     Consultants:   ID , ENT  Procedures:i/d in Er  Antimicrobials:   vanco   Subjective: Feels able to open eyes more and feels better. Forehead still tender. tmax 101.3  Objective: Vitals:   11/21/19 0547 11/21/19 0554 11/21/19 0739 11/21/19 0800  BP: (!) 150/65 (!) 156/68 134/70   Pulse: 86 86 77   Resp: 17 16  15   Temp:  99.8 F (37.7 C) 98.8 F (37.1 C)   TempSrc:  Oral Oral   SpO2: 98% 96% 94%   Weight:      Height:        Intake/Output Summary (Last 24 hours) at 11/21/2019 0916 Last data filed at 11/21/2019 0800 Gross per 24 hour  Intake 1984.5 ml  Output --  Net 1984.5 ml   Filed Weights   11/20/19 2300  Weight: 76.3 kg    Examination:  General exam: Appears calm and comfortable,  HEENT: facial swelling and  Erythema improving, more pinkish now. large fluctuant mid forehead, red,warm to touch, tender.  Respiratory system: Clear to auscultation. Respiratory effort normal.no r/w/r Cardiovascular system: S1 & S2 heard, RRR. No JVD, murmurs, rubs, gallops or clicks.  Gastrointestinal system: Abdomen is nondistended, soft and nontender.. Normal bowel sounds heard. Central nervous system: Alert and oriented x3.  Grossly intact Extremities: no edema Skin: Warm dry Psychiatry: Judgement and insight appear normal. Mood & affect appropriate.     Data Reviewed: I have personally reviewed following labs and imaging studies  CBC: Recent Labs  Lab 11/19/19 1326 11/20/19 0339 11/21/19 0409  WBC 24.2* 24.0* 20.1*  NEUTROABS 20.7*  --   --   HGB 12.1 11.6*  11.9*  HCT 39.2 38.1 39.4  MCV 96.8 97.9 98.0  PLT 317 283 664   Basic Metabolic Panel: Recent Labs  Lab 11/19/19 1326 11/20/19 0339  NA 136 141  K 5.2* 5.1  CL 102 103  CO2 23 27  GLUCOSE 216* 46*  BUN 30* 23  CREATININE 1.61* 1.23*  CALCIUM 8.9 8.4*   GFR: Estimated Creatinine Clearance: 36.9 mL/min (A) (by C-G formula based on SCr of 1.23 mg/dL (H)). Liver Function Tests: Recent Labs  Lab 11/19/19 1326  AST 15  ALT 14  ALKPHOS 66  BILITOT 0.5  PROT 6.6  ALBUMIN 2.9*   No results for input(s): LIPASE, AMYLASE in the last 168 hours. No results for input(s): AMMONIA in the last 168 hours. Coagulation Profile: No results for input(s): INR, PROTIME in the last 168 hours. Cardiac Enzymes: No results for input(s): CKTOTAL, CKMB, CKMBINDEX, TROPONINI in the last 168 hours. BNP (last 3 results) No results for input(s): PROBNP in the last 8760 hours. HbA1C: Recent Labs    11/20/19 0339  HGBA1C 8.9*   CBG: Recent Labs  Lab 11/20/19 2020 11/20/19 2358 11/21/19 0414 11/21/19 0433 11/21/19 0714  GLUCAP 173* 292* 64* 89 125*   Lipid Profile: No results for input(s): CHOL, HDL, LDLCALC, TRIG, CHOLHDL, LDLDIRECT in the last 72 hours. Thyroid Function Tests: No results for input(s): TSH, T4TOTAL, FREET4, T3FREE, THYROIDAB in the last 72 hours. Anemia Panel: No results  for input(s): VITAMINB12, FOLATE, FERRITIN, TIBC, IRON, RETICCTPCT in the last 72 hours. Sepsis Labs: Recent Labs  Lab 11/19/19 1326 11/19/19 1602  LATICACIDVEN 1.0 1.1    Recent Results (from the past 240 hour(s))  Culture, blood (routine x 2)     Status: None (Preliminary result)   Collection Time: 11/19/19  4:02 PM   Specimen: BLOOD  Result Value Ref Range Status   Specimen Description BLOOD RIGHT ANTECUBITAL  Final   Special Requests   Final    BOTTLES DRAWN AEROBIC AND ANAEROBIC Blood Culture adequate volume   Culture   Final    NO GROWTH 2 DAYS Performed at [facility],  [address], [place], [place] [address]    Report Status PENDING  Incomplete  Culture, blood (routine x 2)     Status: None (Preliminary result)   Collection Time: 11/19/19  4:45 PM   Specimen: BLOOD  Result Value Ref Range Status   Specimen Description BLOOD SITE NOT SPECIFIED  Final   Special Requests   Final    BOTTLES DRAWN AEROBIC AND ANAEROBIC Blood Culture results may not be optimal due to an inadequate volume of blood received in culture bottles   Culture   Final    NO GROWTH 2 DAYS Performed at [facility], [place] [address] [place], [place] [address]    Report Status PENDING  Incomplete  SARS Coronavirus 2 by RT PCR (hospital order, performed in [facility] hospital lab) Nasopharyngeal Urine, Clean Catch     Status: None   Collection Time: 11/19/19  4:45 PM   Specimen: Urine, Clean Catch; Nasopharyngeal  Result Value Ref Range Status   SARS Coronavirus 2 NEGATIVE NEGATIVE Final    Comment: (NOTE) SARS-CoV-2 target nucleic acids are NOT DETECTED. The SARS-CoV-2 RNA is generally detectable in upper and lower respiratory specimens during the acute phase of infection. The lowest concentration of SARS-CoV-2 viral copies this assay can detect is 250 copies / mL. A negative result does not preclude SARS-CoV-2 infection and should not be used as the sole basis for treatment or other patient management decisions.  A negative result may occur with improper specimen collection / handling, submission of specimen other than nasopharyngeal swab, presence of viral mutation(s) within the areas targeted by this assay, and inadequate number of viral copies (<250 copies / mL). A negative result must be combined with clinical observations, patient history, and epidemiological information. Fact Sheet for Patients:   StrictlyIdeas.no Fact Sheet for Healthcare Providers: BankingDealers.co.za This test is not yet approved or cleared  by the  [place] FDA and has been authorized for detection and/or diagnosis of SARS-CoV-2 by FDA under an Emergency Use Authorization (EUA).  This EUA will remain in effect (meaning this test can be used) for the duration of the COVID-19 declaration under Section 564(b)(1) of the Act, 21 U.S.C. section 360bbb-3(b)(1), unless the authorization is terminated or revoked sooner. Performed at [facility], [place] [address], [place],  [address]   Wound or Superficial Culture     Status: None (Preliminary result)   Collection Time: 11/19/19  5:40 PM   Specimen: Wound  Result Value Ref Range Status   Specimen Description WOUND  Final   Special Requests FOREHEAD  Final   Gram Stain   Final    RARE WBC PRESENT, PREDOMINANTLY PMN FEW GRAM POSITIVE COCCI IN PAIRS IN CLUSTERS    Culture   Final    FEW STAPHYLOCOCCUS AUREUS CULTURE REINCUBATED  FOR BETTER GROWTH Performed at [facility], [place] [address] [place], [place] [address]    Report Status PENDING  Incomplete  Surgical pcr screen     Status: None   Collection Time: 11/20/19  5:41 PM   Specimen: Nasal Mucosa; Nasal Swab  Result Value Ref Range Status   MRSA, PCR NEGATIVE NEGATIVE Final   Staphylococcus aureus NEGATIVE NEGATIVE Final    Comment: (NOTE) The Xpert SA Assay (FDA approved for NASAL specimens in patients 23 years of age and older), is one component of a comprehensive surveillance program. It is not intended to diagnose infection nor to guide or monitor treatment. Performed at [facility], [place] [address], [place], [place] [address]          Radiology Studies: CT MAXILLOFACIAL W CONTRAST  Result Date: 11/20/2019 CLINICAL DATA:  Facial abscess EXAM: CT MAXILLOFACIAL WITH CONTRAST TECHNIQUE: Multidetector CT imaging of the maxillofacial structures was performed with intravenous contrast. Multiplanar CT image reconstructions were also generated. CONTRAST:  75mL OMNIPAQUE IOHEXOL 300 MG/ML  SOLN  COMPARISON:  None. FINDINGS: Osseous: No fracture or mandibular dislocation. No destructive process. Orbits: Negative. No traumatic or inflammatory finding. Right scleral band. Sinuses: Clear. Soft tissues: There is induration and swelling of the skin and subcutaneous tissues of the right frontal scalp. There is no abscess or fluid collection. There is also mild subcutaneous induration and skin thickening in the left submandibular region. Limited intracranial: No significant or unexpected finding. IMPRESSION: Right frontal scalp induration and swelling without abscess or fluid collection. Electronically Signed   By: Ulyses Jarred M.D.   On: 11/20/2019 19:40        Scheduled Meds: . atorvastatin  10 mg Oral QHS  . carvedilol  12.5 mg Oral BID WC  . DULoxetine  60 mg Oral Daily  . ferrous sulfate  325 mg Oral TID WC  . gabapentin  100 mg Oral QHS  . insulin aspart  0-15 Units Subcutaneous Q4H  . insulin glargine  5 Units Subcutaneous QHS  . ketoconazole  1 application Topical Once per day on Mon Thu  . latanoprost  1 drop Left Eye QHS  . magnesium oxide  400 mg Oral Daily  . pantoprazole  40 mg Oral BID   Continuous Infusions: . sodium chloride 75 mL/hr at 11/21/19 0800  . vancomycin 150 mL/hr at 11/20/19 1704    Assessment & Plan:   Principal Problem:   Staph aureus infection Active Problems:   Cellulitis  1.facial cellulitis with abscess-secondary to using razor to cut her hair Still febrile, wbc decreasing Clinically looks better  status post I/D in ER, wound cx staph.A, suscibility pending ENT input was appreciated. CT max completed, see result. Plan for I/D today Consulted ID input was appreciated-continuing vancomycin and will need I&D Hold Eliquis for further I/D    2.DM Type2 Hold home dose medication RISS, fs D/c lantus as am BG low.  HA1c 8.9  3.Acute on CKD stage III-creatinine mildly elevated. Possibly prerenal as she has been having fevers Improved with  hydration, cr today is 1.03 Continue gentle hydration. Renal function  4. PAF- on beta blk and eliquis Will hole a/c for I/D today, resume when cleared by ENT Continue with coreg   5.HTN- bp meds held initially due to low bp now improved,  continue Coreg 12.5 mg p.o. twice daily increased to 25 mg home dose as tolerated    6.Hyperkalemia-mild. Likely from #3. Start IVF was give kayexalate15gm x1  on 6/9 K now 4.7 Continue to monitor  6.  Hypercholesterolemia-continue home statin    DVT prophylaxis: Eiquis.Marland Kitchen> now SCD Code Status: full  Family Communication: by bedside Disposition Plan: back home when stable  Barrier: Patient currently not medically stable for discharge as she requires IV antibiotics, febrile, and will need I&D by ENT. Anticipated discharge date more than >3 days      LOS: 2 days   Time spent: 45 min with >50% on coc    Nolberto Hanlon, MD [facility] Pager [phone]  If 7PM-7AM, please contact night-coverage [email or web address] Password [facility] 11/21/2019, 9:16 AM Patient ID: Sabrina Baker, female   DOB: [birth year], 79 y.o.   MRN: [id number]

## 2019-11-21 NOTE — Progress Notes (Signed)
° °  ENT Progress Note: s/p Procedure(s): INCISION AND DRAINAGE FOREHEAD & RIGHT EAR ABSCESS   Subjective: Facial pain/swelling  Objective: Vital signs in last 24 hours: Temp:  [98.6 F (37 C)-101.3 F (38.5 C)] 98.8 F (37.1 C) (06/11 0739) Pulse Rate:  [77-91] 77 (06/11 0739) Resp:  [15-18] 15 (06/11 0800) BP: (134-177)/(52-85) 134/70 (06/11 0739) SpO2:  [90 %-99 %] 94 % (06/11 0739) Weight:  [76.3 kg] 76.3 kg (06/10 2300) Weight change:  Last BM Date: 11/21/19  Intake/Output from previous day: 06/10 0701 - 06/11 0700 In: 1752.4 [I.V.:1602.3; IV Piggyback:150.1] Out: -  Intake/Output this shift: Total I/O In: 232.1 [I.V.:232.1] Out: -   Labs: Recent Labs    11/20/19 0339 11/21/19 0409  WBC 24.0* 20.1*  HGB 11.6* 11.9*  HCT 38.1 39.4  PLT 283 311   Recent Labs    11/20/19 0339 11/21/19 0409  NA 141 139  K 5.1 4.7  CL 103 105  CO2 27 25  GLUCOSE 46* 65*  BUN 23 15  CALCIUM 8.4* 8.4*    Studies/Results: CT MAXILLOFACIAL W CONTRAST  Result Date: 11/20/2019 CLINICAL DATA:  Facial abscess EXAM: CT MAXILLOFACIAL WITH CONTRAST TECHNIQUE: Multidetector CT imaging of the maxillofacial structures was performed with intravenous contrast. Multiplanar CT image reconstructions were also generated. CONTRAST:  86mL OMNIPAQUE IOHEXOL 300 MG/ML  SOLN COMPARISON:  None. FINDINGS: Osseous: No fracture or mandibular dislocation. No destructive process. Orbits: Negative. No traumatic or inflammatory finding. Right scleral band. Sinuses: Clear. Soft tissues: There is induration and swelling of the skin and subcutaneous tissues of the right frontal scalp. There is no abscess or fluid collection. There is also mild subcutaneous induration and skin thickening in the left submandibular region. Limited intracranial: No significant or unexpected finding. IMPRESSION: Right frontal scalp induration and swelling without abscess or fluid collection. Electronically Signed   By: Ulyses Jarred  M.D.   On: 11/20/2019 19:40     PHYSICAL EXAM: Cont swelling   Assessment/Plan: Pt for I&D abscess    Jerrell Belfast 11/21/2019, 12:10 PM

## 2019-11-21 NOTE — Plan of Care (Signed)

## 2019-11-21 NOTE — Anesthesia Preprocedure Evaluation (Signed)
Anesthesia Evaluation  Patient identified by MRN, date of birth, ID band Patient awake    Reviewed: Allergy & Precautions, NPO status , Patient's Chart, lab work & pertinent test results  Airway Mallampati: I  TM Distance: >3 FB Neck ROM: Full    Dental  (+) Edentulous Upper, Edentulous Lower   Pulmonary neg pulmonary ROS,     + decreased breath sounds      Cardiovascular hypertension, Pt. on medications and Pt. on home beta blockers  Rhythm:Regular Rate:Normal     Neuro/Psych TIAnegative psych ROS   GI/Hepatic Neg liver ROS, GERD  Medicated,  Endo/Other  diabetes, Type 2, Insulin Dependent  Renal/GU CRFRenal disease     Musculoskeletal negative musculoskeletal ROS (+)   Abdominal Normal abdominal exam  (+)   Peds  Hematology negative hematology ROS (+)   Anesthesia Other Findings   Reproductive/Obstetrics                             Anesthesia Physical Anesthesia Plan  ASA: III  Anesthesia Plan: General   Post-op Pain Management:    Induction: Intravenous  PONV Risk Score and Plan: 4 or greater and Ondansetron, Dexamethasone and Treatment may vary due to age or medical condition  Airway Management Planned: Oral ETT  Additional Equipment: None  Intra-op Plan:   Post-operative Plan: Extubation in OR  Informed Consent: I have reviewed the patients History and Physical, chart, labs and discussed the procedure including the risks, benefits and alternatives for the proposed anesthesia with the patient or authorized representative who has indicated his/her understanding and acceptance.     Dental advisory given  Plan Discussed with: CRNA  Anesthesia Plan Comments:         Anesthesia Quick Evaluation

## 2019-11-21 NOTE — Progress Notes (Signed)
Pharmacy Antibiotic Note  Sabrina Baker is a 79 y.o. female admitted on 11/19/2019 with cellulitis.  Pharmacy has been consulted for vancomycin dosing.  Patient is currently on vancomycin 750 mg IV q24h. Today, Tmax 101.3, WBC remain elevated, and serum creatinine has improved.  Plan: Increase vancomycin to 1250 mg IV every 24 hours to reach predicted goal trough of 10-15. Monitor clinical status, renal function, opportunities for de-escalation, vancomycin level if indicated   Temp (24hrs), Avg:99.9 F (37.7 C), Min:98.6 F (37 C), Max:101.3 F (38.5 C)  Recent Labs  Lab 11/19/19 1326 11/19/19 1602 11/20/19 0339 11/21/19 0409  WBC 24.2*  --  24.0* 20.1*  CREATININE 1.61*  --  1.23* 1.03*  LATICACIDVEN 1.0 1.1  --   --     Estimated Creatinine Clearance: 44.1 mL/min (A) (by C-G formula based on SCr of 1.03 mg/dL (H)).    Allergies  Allergen Reactions   Altace [Ramipril] Anaphylaxis   Cortisone Itching and Rash   Diovan [Valsartan] Other (See Comments)    Hyperkalemia    Niacin Diarrhea    And nausea   Niacin And Related Diarrhea    Antimicrobials this admission: Vancomycin 6/9 >>  Ceftriaxone 6/9 x1   Dose adjustments this admission: 6/11: Increase vancomycin from 750 mg IV q24h to 1250 mg IV q24h  Microbiology results: 6/9 Bcx: ngtd 6/9 Wound cx: gram stain: few gpc in pairs/clusters, culture: few staph aureus; reincubated   Thank you for allowing pharmacy to be a part of this patients care.   Shela Commons, PharmD, BCPS Please check amion for clinical pharmacist contact number 11/21/2019 9:52 AM

## 2019-11-21 NOTE — Progress Notes (Signed)
S/p surgery for I and D.   Culture senstivities still pending Continue with vancomycin and anticipate oral therapy at discharge based on sensitivities.  Will follow up tomorrow.  Thayer Headings, MD

## 2019-11-21 NOTE — Progress Notes (Signed)
Results for DURA, MCCORMACK (MRN [id number]) as of 11/21/2019 10:48  Ref. Range 11/20/2019 20:20 11/20/2019 23:58 11/21/2019 04:14 11/21/2019 04:33 11/21/2019 07:14  Glucose-Capillary Latest Ref Range: 70 - 99 mg/dL 173 (H) 292 (H) 64 (L) 89 125 (H)  Noted that patient had a low blood sugar of 64 mg/dl.   Recommend decreasing Novolog correction scale to SENSITIVE TID & HS scale, especially if blood sugars continue to be less than 70 mg/dl.   Harvel Ricks RN BSN CDE Diabetes Coordinator Pager: [phone]  8am-5pm

## 2019-11-21 NOTE — Transfer of Care (Signed)
Immediate Anesthesia Transfer of Care Note  Patient: Sabrina Baker  Procedure(s) Performed: INCISION AND DRAINAGE FOREHEAD & RIGHT EAR ABSCESS (Right )  Patient Location: PACU  Anesthesia Type:General  Level of Consciousness: drowsy and patient cooperative  Airway & Oxygen Therapy: Patient Spontanous Breathing and Patient connected to face mask oxygen  Post-op Assessment: Report given to RN, Post -op Vital signs reviewed and stable and Patient moving all extremities  Post vital signs: Reviewed and stable  Last Vitals:  Vitals Value Taken Time  BP 145/62 11/21/19 1315  Temp 37.3 C 11/21/19 1315  Pulse 70 11/21/19 1317  Resp 26 11/21/19 1317  SpO2 95 % 11/21/19 1317  Vitals shown include unvalidated device data.  Last Pain:  Vitals:   11/21/19 1100  TempSrc:   PainSc: 0-No pain         Complications: No complications documented.

## 2019-11-21 NOTE — Op Note (Signed)
Operative Note: INCISION & DRAINAGE FACIAL ABSCESS  Patient: Sabrina Baker record number: [id number]  Date:11/21/2019  Pre-operative Indications: Multiple facial abscesses involving forehead and right postauricular region  Postoperative Indications: Same  Surgical Procedure: Incision and Drainage of facial abscesses   Anesthesia: GET  Surgeon: Delsa Bern, M.D.  Assist: None  Complications: None  EBL: Less than 50 cc   Brief History: The patient is a 79 y.o. female with a history of acute swelling and erythema of the forehead adjacent to the hairline and in the right postauricular region.  Patient reported shaving with a razor and developing acute swelling and pain several days later.  She was admitted to the hospital for presumed soft tissue abscess and cellulitis.  After 2 days of appropriate antibiotic therapy the patient continues to have swelling and developing progressive abscess.  CT scan of the neck showed soft tissue fluid collections consistent with cutaneous abscess in the forehead and right postauricular region, underlying sinuses are normal no evidence of acute sinusitis or mastoiditis.  No other significant adenopathy noted.  Given the patient's history and findings, I recommended incision and drainage of abscess under general anesthesia, risks and benefits were discussed in detail with the patient and their family. They understand and agree with our plan for surgery which is scheduled at [facility] on an elective basis.  Surgical Procedure: The patient is brought to the operating room on 11/21/2019 and placed in supine position on the operating table. General endotracheal anesthesia was established without difficulty. When the patient was adequately anesthetized, surgical timeout was performed and correct identification of the patient and the surgical procedure. The patient was positioned and prepped and draped in sterile fashion.  Patient's preoperative  imaging was reviewed.  A #15 scalpel was used to make an incision in the skin and underlying deep subcutaneous tissue.  The patient had significant purulent material expressed from the forehead as well as the right postauricular region.  Underlying soft tissue was dissected using hemostat and gentle curette.  All infected material was cleared.  1/4 in. Penrose drain was placed in each incision and sutured to the skin with 4-0 Ethilon suture.  Patient was then dressed with 4 x 4 gauze and Kerlix wrap.    An orogastric tube was passed and stomach contents were aspirated. Patient was awakened from anesthetic and transferred from the operating room to the recovery room in stable condition. There were no complications and blood loss was minimal.   Delsa Bern, M.D. [facility] 11/21/2019

## 2019-11-21 NOTE — Anesthesia Procedure Notes (Signed)

## 2019-11-21 NOTE — Anesthesia Postprocedure Evaluation (Signed)
Anesthesia Post Note  Patient: Sabrina Baker  Procedure(s) Performed: INCISION AND DRAINAGE FOREHEAD & RIGHT EAR ABSCESS (Right )     Patient location during evaluation: PACU Anesthesia Type: General Level of consciousness: awake and alert Pain management: pain level controlled Vital Signs Assessment: post-procedure vital signs reviewed and stable Respiratory status: spontaneous breathing, nonlabored ventilation, respiratory function stable and patient connected to nasal cannula oxygen Cardiovascular status: blood pressure returned to baseline and stable Postop Assessment: no apparent nausea or vomiting Anesthetic complications: no   No complications documented.  Last Vitals:  Vitals:   11/21/19 1430 11/21/19 1443  BP: (!) 142/72 (!) 175/74  Pulse:  69  Resp:  15  Temp: 37.4 C 37.6 C  SpO2:  96%    Last Pain:  Vitals:   11/21/19 1443  TempSrc: Oral  PainSc: 2                  Effie Berkshire

## 2019-11-22 ENCOUNTER — Encounter (HOSPITAL_COMMUNITY): Payer: Self-pay | Admitting: Otolaryngology

## 2019-11-22 LAB — GLUCOSE, CAPILLARY
Glucose-Capillary: 165 mg/dL — ABNORMAL HIGH (ref 70–99)
Glucose-Capillary: 179 mg/dL — ABNORMAL HIGH (ref 70–99)
Glucose-Capillary: 228 mg/dL — ABNORMAL HIGH (ref 70–99)
Glucose-Capillary: 244 mg/dL — ABNORMAL HIGH (ref 70–99)
Glucose-Capillary: 283 mg/dL — ABNORMAL HIGH (ref 70–99)
Glucose-Capillary: 301 mg/dL — ABNORMAL HIGH (ref 70–99)
Glucose-Capillary: 305 mg/dL — ABNORMAL HIGH (ref 70–99)

## 2019-11-22 LAB — CBC
HCT: 36.5 % (ref 36.0–46.0)
Hemoglobin: 11.2 g/dL — ABNORMAL LOW (ref 12.0–15.0)
MCH: 29.8 pg (ref 26.0–34.0)
MCHC: 30.7 g/dL (ref 30.0–36.0)
MCV: 97.1 fL (ref 80.0–100.0)
Platelets: 314 10*3/uL (ref 150–400)
RBC: 3.76 MIL/uL — ABNORMAL LOW (ref 3.87–5.11)
RDW: 15.8 % — ABNORMAL HIGH (ref 11.5–15.5)
WBC: 19.1 10*3/uL — ABNORMAL HIGH (ref 4.0–10.5)
nRBC: 0 % (ref 0.0–0.2)

## 2019-11-22 LAB — AEROBIC CULTURE W GRAM STAIN (SUPERFICIAL SPECIMEN)

## 2019-11-22 MED ORDER — INSULIN GLARGINE 100 UNIT/ML ~~LOC~~ SOLN
3.0000 [IU] | Freq: Every day | SUBCUTANEOUS | Status: DC
  Administered 2019-11-22 – 2019-11-25 (×4): 3 [IU] via SUBCUTANEOUS
  Filled 2019-11-22 (×4): qty 0.03

## 2019-11-22 MED ORDER — DIPHENHYDRAMINE HCL 25 MG PO CAPS
25.0000 mg | ORAL_CAPSULE | Freq: Once | ORAL | Status: AC
  Administered 2019-11-22: 25 mg via ORAL
  Filled 2019-11-22: qty 1

## 2019-11-22 MED ORDER — VANCOMYCIN HCL 1250 MG/250ML IV SOLN
1250.0000 mg | INTRAVENOUS | Status: DC
  Administered 2019-11-22 – 2019-11-25 (×4): 1250 mg via INTRAVENOUS
  Filled 2019-11-22 (×4): qty 250

## 2019-11-22 NOTE — Plan of Care (Signed)

## 2019-11-22 NOTE — Progress Notes (Signed)
Her culture from the ED now with MRSA and she will continue with vancomycin. S/p debridement by ENT yesterday with drains in place.  Continue vancomycin for now and doxycycline po at discharge. Will continue to follow. Thayer Headings, MD

## 2019-11-22 NOTE — Plan of Care (Signed)

## 2019-11-22 NOTE — Progress Notes (Signed)
Forehead R ear dressing completed. Scant serosanguinous noted. Forehead drain and Behind R ear drain Clean dry and Intact. Day shift nurse updated

## 2019-11-22 NOTE — Progress Notes (Signed)
   ENT Progress Note: POD #1 s/p Procedure(s): INCISION AND DRAINAGE FOREHEAD & RIGHT EAR ABSCESS   Subjective: Decreased pain  Objective: Vital signs in last 24 hours: Temp:  [98 F (36.7 C)-99.6 F (37.6 C)] 98.7 F (37.1 C) (06/12 0811) Pulse Rate:  [65-84] 71 (06/12 0811) Resp:  [12-25] 17 (06/12 0811) BP: (135-175)/(62-81) 159/74 (06/12 0811) SpO2:  [93 %-98 %] 98 % (06/12 0811) Weight change:  Last BM Date: 11/21/19  Intake/Output from previous day: 06/11 0701 - 06/12 0700 In: 1459.1 [P.O.:480; I.V.:979.1] Out: 5 [Blood:5] Intake/Output this shift: No intake/output data recorded.  Labs: Recent Labs    11/21/19 0409 11/22/19 0447  WBC 20.1* 19.1*  HGB 11.9* 11.2*  HCT 39.4 36.5  PLT 311 314   Recent Labs    11/20/19 0339 11/21/19 0409  NA 141 139  K 5.1 4.7  CL 103 105  CO2 27 25  GLUCOSE 46* 65*  BUN 23 15  CALCIUM 8.4* 8.4*    Studies/Results: CT MAXILLOFACIAL W CONTRAST  Result Date: 11/20/2019 CLINICAL DATA:  Facial abscess EXAM: CT MAXILLOFACIAL WITH CONTRAST TECHNIQUE: Multidetector CT imaging of the maxillofacial structures was performed with intravenous contrast. Multiplanar CT image reconstructions were also generated. CONTRAST:  49mL OMNIPAQUE IOHEXOL 300 MG/ML  SOLN COMPARISON:  None. FINDINGS: Osseous: No fracture or mandibular dislocation. No destructive process. Orbits: Negative. No traumatic or inflammatory finding. Right scleral band. Sinuses: Clear. Soft tissues: There is induration and swelling of the skin and subcutaneous tissues of the right frontal scalp. There is no abscess or fluid collection. There is also mild subcutaneous induration and skin thickening in the left submandibular region. Limited intracranial: No significant or unexpected finding. IMPRESSION: Right frontal scalp induration and swelling without abscess or fluid collection. Electronically Signed   By: Ulyses Jarred M.D.   On: 11/20/2019 19:40     PHYSICAL EXAM: JP  drains are in place, moderate output.  Decreased erythema and swelling.   Assessment/Plan: Patient with significant clinical improvement, decreased swelling and pain.  Continue current antibiotic therapy, medical care and dressing changes.  Plan drain removal when output decreases.    Jerrell Belfast 11/22/2019, 8:56 AM

## 2019-11-22 NOTE — Progress Notes (Signed)
PROGRESS NOTE    Sabrina Baker  YIR:[id number] DOB: [birth year] DOA: 11/19/2019 PCP: [name], MD    Brief Narrative:  Sabrina Baker is a 79 y.o. female with medical history significant of chronic kidney disease stage III, diabetes mellitus type 2, paroxysmal atrial fibrillation, hypercholesterolemia, hypertension was sent by her primary care today for treatment of facial cellulitis and abscess.     Consultants:   ID , ENT  Procedures:i/d in Er  Antimicrobials:   vanco   Subjective: Feels much better. Swelling of face is down. Eye erythema milder now, able to open eyes . Has some pain at incision site.   Objective: Vitals:   11/21/19 1443 11/21/19 2010 11/21/19 2335 11/22/19 0408  BP: (!) 175/74 135/64 (!) 156/62 (!) 147/67  Pulse: 69 72 65 67  Resp: 15 14 14 14   Temp: 99.6 F (37.6 C) 98.2 F (36.8 C) 98 F (36.7 C) 98.1 F (36.7 C)  TempSrc: Oral Oral Oral Oral  SpO2: 96% 98% 96% 98%  Weight:      Height:        Intake/Output Summary (Last 24 hours) at 11/22/2019 0748 Last data filed at 11/21/2019 2000 Gross per 24 hour  Intake 1459.11 ml  Output 5 ml  Net 1454.11 ml   Filed Weights   11/20/19 2300  Weight: 76.3 kg    Examination:  General exam: Appears calm and comfortable,  HEENT: facial swelling down and  Erythema improving , now light pink. more forehead dressing in place, I did not open Respiratory system: Clear to auscultation. Respiratory effort normal.no r/w/r Cardiovascular system: S1 & S2 heard, RRR. No JVD, murmurs, rubs, gallops or clicks.  Gastrointestinal system: Abdomen is nondistended, soft and nontender.. Normal bowel sounds heard. Central nervous system: Alert and oriented x3.  Grossly intact Extremities: no edema Skin: Warm dry Psychiatry: Judgement and insight appear normal. Mood & affect appropriate.     Data Reviewed: I have personally reviewed following labs and imaging studies  CBC: Recent Labs  Lab 11/19/19 1326  11/20/19 0339 11/21/19 0409 11/22/19 0447  WBC 24.2* 24.0* 20.1* 19.1*  NEUTROABS 20.7*  --   --   --   HGB 12.1 11.6* 11.9* 11.2*  HCT 39.2 38.1 39.4 36.5  MCV 96.8 97.9 98.0 97.1  PLT 317 283 311 500   Basic Metabolic Panel: Recent Labs  Lab 11/19/19 1326 11/20/19 0339 11/21/19 0409  NA 136 141 139  K 5.2* 5.1 4.7  CL 102 103 105  CO2 23 27 25   GLUCOSE 216* 46* 65*  BUN 30* 23 15  CREATININE 1.61* 1.23* 1.03*  CALCIUM 8.9 8.4* 8.4*   GFR: Estimated Creatinine Clearance: 44.1 mL/min (A) (by C-G formula based on SCr of 1.03 mg/dL (H)). Liver Function Tests: Recent Labs  Lab 11/19/19 1326  AST 15  ALT 14  ALKPHOS 66  BILITOT 0.5  PROT 6.6  ALBUMIN 2.9*   No results for input(s): LIPASE, AMYLASE in the last 168 hours. No results for input(s): AMMONIA in the last 168 hours. Coagulation Profile: No results for input(s): INR, PROTIME in the last 168 hours. Cardiac Enzymes: No results for input(s): CKTOTAL, CKMB, CKMBINDEX, TROPONINI in the last 168 hours. BNP (last 3 results) No results for input(s): PROBNP in the last 8760 hours. HbA1C: Recent Labs    11/20/19 0339  HGBA1C 8.9*   CBG: Recent Labs  Lab 11/21/19 1321 11/21/19 1618 11/21/19 2013 11/21/19 2336 11/22/19 0419  GLUCAP 151* 205* 355* 305* 228*  Lipid Profile: No results for input(s): CHOL, HDL, LDLCALC, TRIG, CHOLHDL, LDLDIRECT in the last 72 hours. Thyroid Function Tests: No results for input(s): TSH, T4TOTAL, FREET4, T3FREE, THYROIDAB in the last 72 hours. Anemia Panel: No results for input(s): VITAMINB12, FOLATE, FERRITIN, TIBC, IRON, RETICCTPCT in the last 72 hours. Sepsis Labs: Recent Labs  Lab 11/19/19 1326 11/19/19 1602  LATICACIDVEN 1.0 1.1    Recent Results (from the past 240 hour(s))  Culture, blood (routine x 2)     Status: None (Preliminary result)   Collection Time: 11/19/19  4:02 PM   Specimen: BLOOD  Result Value Ref Range Status   Specimen Description BLOOD RIGHT  ANTECUBITAL  Final   Special Requests   Final    BOTTLES DRAWN AEROBIC AND ANAEROBIC Blood Culture adequate volume   Culture   Final    NO GROWTH 3 DAYS Performed at [facility], [address], [place], [place] [address]    Report Status PENDING  Incomplete  Culture, blood (routine x 2)     Status: None (Preliminary result)   Collection Time: 11/19/19  4:45 PM   Specimen: BLOOD  Result Value Ref Range Status   Specimen Description BLOOD SITE NOT SPECIFIED  Final   Special Requests   Final    BOTTLES DRAWN AEROBIC AND ANAEROBIC Blood Culture results may not be optimal due to an inadequate volume of blood received in culture bottles   Culture   Final    NO GROWTH 3 DAYS Performed at [facility], [place] [address], [place], [place] [address]    Report Status PENDING  Incomplete  SARS Coronavirus 2 by RT PCR (hospital order, performed in [facility] hospital lab) Nasopharyngeal Urine, Clean Catch     Status: None   Collection Time: 11/19/19  4:45 PM   Specimen: Urine, Clean Catch; Nasopharyngeal  Result Value Ref Range Status   SARS Coronavirus 2 NEGATIVE NEGATIVE Final    Comment: (NOTE) SARS-CoV-2 target nucleic acids are NOT DETECTED. The SARS-CoV-2 RNA is generally detectable in upper and lower respiratory specimens during the acute phase of infection. The lowest concentration of SARS-CoV-2 viral copies this assay can detect is 250 copies / mL. A negative result does not preclude SARS-CoV-2 infection and should not be used as the sole basis for treatment or other patient management decisions.  A negative result may occur with improper specimen collection / handling, submission of specimen other than nasopharyngeal swab, presence of viral mutation(s) within the areas targeted by this assay, and inadequate number of viral copies (<250 copies / mL). A negative result must be combined with clinical observations, patient history, and epidemiological information. Fact  Sheet for Patients:   StrictlyIdeas.no Fact Sheet for Healthcare Providers: BankingDealers.co.za This test is not yet approved or cleared  by the [place] FDA and has been authorized for detection and/or diagnosis of SARS-CoV-2 by FDA under an Emergency Use Authorization (EUA).  This EUA will remain in effect (meaning this test can be used) for the duration of the COVID-19 declaration under Section 564(b)(1) of the Act, 21 U.S.C. section 360bbb-3(b)(1), unless the authorization is terminated or revoked sooner. Performed at [facility], [place] [address], [place], [place] [address]   Wound or Superficial Culture     Status: None (Preliminary result)   Collection Time: 11/19/19  5:40 PM   Specimen: Wound  Result Value Ref Range Status   Specimen Description WOUND  Final   Special Requests FOREHEAD  Final  Gram Stain   Final    RARE WBC PRESENT, PREDOMINANTLY PMN FEW GRAM POSITIVE COCCI IN PAIRS IN CLUSTERS    Culture   Final    FEW STAPHYLOCOCCUS AUREUS SUSCEPTIBILITIES TO FOLLOW Performed at [facility], [place] [address], [place], [place] [address]    Report Status PENDING  Incomplete  Surgical pcr screen     Status: None   Collection Time: 11/20/19  5:41 PM   Specimen: Nasal Mucosa; Nasal Swab  Result Value Ref Range Status   MRSA, PCR NEGATIVE NEGATIVE Final   Staphylococcus aureus NEGATIVE NEGATIVE Final    Comment: (NOTE) The Xpert SA Assay (FDA approved for NASAL specimens in patients 32 years of age and older), is one component of a comprehensive surveillance program. It is not intended to diagnose infection nor to guide or monitor treatment. Performed at [facility], [place] [address] [place], [place] [address]          Radiology Studies: CT MAXILLOFACIAL W CONTRAST  Result Date: 11/20/2019 CLINICAL DATA:  Facial abscess EXAM: CT MAXILLOFACIAL WITH CONTRAST TECHNIQUE: Multidetector CT imaging  of the maxillofacial structures was performed with intravenous contrast. Multiplanar CT image reconstructions were also generated. CONTRAST:  41mL OMNIPAQUE IOHEXOL 300 MG/ML  SOLN COMPARISON:  None. FINDINGS: Osseous: No fracture or mandibular dislocation. No destructive process. Orbits: Negative. No traumatic or inflammatory finding. Right scleral band. Sinuses: Clear. Soft tissues: There is induration and swelling of the skin and subcutaneous tissues of the right frontal scalp. There is no abscess or fluid collection. There is also mild subcutaneous induration and skin thickening in the left submandibular region. Limited intracranial: No significant or unexpected finding. IMPRESSION: Right frontal scalp induration and swelling without abscess or fluid collection. Electronically Signed   By: Ulyses Jarred M.D.   On: 11/20/2019 19:40        Scheduled Meds: . atorvastatin  10 mg Oral QHS  . carvedilol  12.5 mg Oral BID WC  . DULoxetine  60 mg Oral Daily  . ferrous sulfate  325 mg Oral TID WC  . gabapentin  100 mg Oral QHS  . insulin aspart  0-15 Units Subcutaneous Q4H  . ketoconazole  1 application Topical Once per day on Mon Thu  . latanoprost  1 drop Left Eye QHS  . magnesium oxide  400 mg Oral Daily  . pantoprazole  40 mg Oral BID   Continuous Infusions: . sodium chloride Stopped (11/21/19 1050)  . vancomycin      Assessment & Plan:   Principal Problem:   Staph aureus infection Active Problems:   Cellulitis  1.facial cellulitis with abscess-secondary to using razor to cut her hair wbc decreasing Clinically improving  status post I/D in ER, wound cx staph.A, suscibility pending ENT input was appreciated. CT max completed, see result. S/p I/D by ENT on 6/11-Plan drain removal when output decreases. ID following -culture from the ED now with MRSA. Continue vancomycin for now and doxycycline po at discharge. Hold Eliquis for now until cleared by ENT to resume   2.DM  Type2 Hold home dose medication RISS, fs lantus was d/c 'd due to low bg, now bg elevated, will resume at 3units daily  HA1c 8.9  3.Acute on CKD stage III-creatinine mildly elevated. Possibly prerenal as she has been having fevers Improved with hydration, cr today is 1.03 Continue gentle hydration. Renal function  4. PAF- on beta blk and eliquis Will hole a/c for I/D today, resume when cleared by  ENT Continue with coreg   5.HTN- bp meds held initially due to low bp now improved,  continue Coreg 12.5 mg p.o. twice daily increased to 25 mg home dose as tolerated    6.Hyperkalemia-mild. Likely from #3. Start IVF was give kayexalate15gm x1 on 6/9 K now 4.7 Continue to monitor  6.  Hypercholesterolemia-continue home statin    DVT prophylaxis: Eiquis.Marland Kitchen> now SCD Code Status: full  Family Communication: by bedside Disposition Plan: back home when stable  Barrier: Patient currently not medically stable for discharge as she requires IV antibiotics, febrile, and needs drain to be discontinued on monday Anticipated discharge date more than >3 days      LOS: 3 days   Time spent: 45 min with >50% on coc    Nolberto Hanlon, MD [facility] Pager [phone]  If 7PM-7AM, please contact night-coverage [email or web address] Password [facility] 11/22/2019, 7:48 AM Patient ID: Sabrina Baker, female   DOB: [birth year], 79 y.o.   MRN: [id number] Patient ID: Sabrina Baker, female   DOB: 1940/12/11, 79 y.o.   MRN: [id number]

## 2019-11-23 LAB — GLUCOSE, CAPILLARY
Glucose-Capillary: 151 mg/dL — ABNORMAL HIGH (ref 70–99)
Glucose-Capillary: 151 mg/dL — ABNORMAL HIGH (ref 70–99)
Glucose-Capillary: 154 mg/dL — ABNORMAL HIGH (ref 70–99)
Glucose-Capillary: 178 mg/dL — ABNORMAL HIGH (ref 70–99)
Glucose-Capillary: 188 mg/dL — ABNORMAL HIGH (ref 70–99)
Glucose-Capillary: 85 mg/dL (ref 70–99)

## 2019-11-23 NOTE — Progress Notes (Signed)
   ENT Progress Note: POD #2 s/p Procedure(s): INCISION AND DRAINAGE FOREHEAD & RIGHT EAR ABSCESS   Subjective: Decreased pain  Objective: Vital signs in last 24 hours: Temp:  [98.3 F (36.8 C)-98.6 F (37 C)] 98.5 F (36.9 C) (06/13 0844) Pulse Rate:  [70-81] 81 (06/13 0844) Resp:  [14-18] 15 (06/13 0844) BP: (147-176)/(67-78) 151/78 (06/13 0844) SpO2:  [91 %-96 %] 96 % (06/13 0844) Weight change:  Last BM Date: 11/22/19  Intake/Output from previous day: 06/12 0701 - 06/13 0700 In: 240 [P.O.:240] Out: -  Intake/Output this shift: No intake/output data recorded.  Labs: Recent Labs    11/21/19 0409 11/22/19 0447  WBC 20.1* 19.1*  HGB 11.9* 11.2*  HCT 39.4 36.5  PLT 311 314   Recent Labs    11/21/19 0409  NA 139  K 4.7  CL 105  CO2 25  GLUCOSE 65*  BUN 15  CALCIUM 8.4*    Studies/Results: No results found.   PHYSICAL EXAM: JP's inplace - decreased output Improving swelling and erythema on forehead   Assessment/Plan: Cont current tx If cont improvement, plan drain removal 6/14    Jerrell Belfast 11/23/2019, 9:22 AM

## 2019-11-23 NOTE — Progress Notes (Signed)
[facility] for Infectious [facility]   Reason for visit: Follow up on facial abscess  Interval History: s/p debridement, drains remain in place.  Afebrile > 48 hours.  No new complaints.     Physical Exam: Constitutional:  Vitals:   11/23/19 0844 11/23/19 0900  BP: (!) 151/78 (!) 151/78  Pulse: 81 81  Resp: 15   Temp: 98.5 F (36.9 C)   SpO2: 96%    patient appears in NAD HENT: + wrap Respiratory: Normal respiratory effort GI: soft, nt, nd  Review of Systems: Constitutional: negative for fevers and chills Gastrointestinal: negative for nausea and diarrhea  Lab Results  Component Value Date   WBC 19.1 (H) 11/22/2019   HGB 11.2 (L) 11/22/2019   HCT 36.5 11/22/2019   MCV 97.1 11/22/2019   PLT 314 11/22/2019    Lab Results  Component Value Date   CREATININE 1.03 (H) 11/21/2019   BUN 15 11/21/2019   NA 139 11/21/2019   K 4.7 11/21/2019   CL 105 11/21/2019   CO2 25 11/21/2019    Lab Results  Component Value Date   ALT 14 11/19/2019   AST 15 11/19/2019   ALKPHOS 66 11/19/2019     Microbiology: Recent Results (from the past 240 hour(s))  Culture, blood (routine x 2)     Status: None (Preliminary result)   Collection Time: 11/19/19  4:02 PM   Specimen: BLOOD  Result Value Ref Range Status   Specimen Description BLOOD RIGHT ANTECUBITAL  Final   Special Requests   Final    BOTTLES DRAWN AEROBIC AND ANAEROBIC Blood Culture adequate volume   Culture   Final    NO GROWTH 4 DAYS Performed at [facility], [address], [place], [place] [address]    Report Status PENDING  Incomplete  Culture, blood (routine x 2)     Status: None (Preliminary result)   Collection Time: 11/19/19  4:45 PM   Specimen: BLOOD  Result Value Ref Range Status   Specimen Description BLOOD SITE NOT SPECIFIED  Final   Special Requests   Final    BOTTLES DRAWN AEROBIC AND ANAEROBIC Blood Culture results may not be optimal due to an inadequate volume of blood received in culture  bottles   Culture   Final    NO GROWTH 4 DAYS Performed at [facility], [place] [address], [place],  [address]    Report Status PENDING  Incomplete  SARS Coronavirus 2 by RT PCR (hospital order, performed in [facility] hospital lab) Nasopharyngeal Urine, Clean Catch     Status: None   Collection Time: 11/19/19  4:45 PM   Specimen: Urine, Clean Catch; Nasopharyngeal  Result Value Ref Range Status   SARS Coronavirus 2 NEGATIVE NEGATIVE Final    Comment: (NOTE) SARS-CoV-2 target nucleic acids are NOT DETECTED. The SARS-CoV-2 RNA is generally detectable in upper and lower respiratory specimens during the acute phase of infection. The lowest concentration of SARS-CoV-2 viral copies this assay can detect is 250 copies / mL. A negative result does not preclude SARS-CoV-2 infection and should not be used as the sole basis for treatment or other patient management decisions.  A negative result may occur with improper specimen collection / handling, submission of specimen other than nasopharyngeal swab, presence of viral mutation(s) within the areas targeted by this assay, and inadequate number of viral copies (<250 copies / mL). A negative result must be combined with clinical observations, patient history, and epidemiological information.  Fact Sheet for Patients:   StrictlyIdeas.no Fact Sheet for Healthcare Providers: BankingDealers.co.za This test is not yet approved or cleared  by the [place] FDA and has been authorized for detection and/or diagnosis of SARS-CoV-2 by FDA under an Emergency Use Authorization (EUA).  This EUA will remain in effect (meaning this test can be used) for the duration of the COVID-19 declaration under Section 564(b)(1) of the Act, 21 U.S.C. section 360bbb-3(b)(1), unless the authorization is terminated or revoked sooner. Performed at [facility], [place] [address], [place], [name] [address]     Wound or Superficial Culture     Status: None   Collection Time: 11/19/19  5:40 PM   Specimen: Wound  Result Value Ref Range Status   Specimen Description WOUND  Final   Special Requests FOREHEAD  Final   Gram Stain   Final    RARE WBC PRESENT, PREDOMINANTLY PMN FEW GRAM POSITIVE COCCI IN PAIRS IN CLUSTERS Performed at [facility], [address] [place], [place] [address]    Culture FEW METHICILLIN RESISTANT STAPHYLOCOCCUS AUREUS  Final   Report Status 11/22/2019 FINAL  Final   Organism ID, Bacteria METHICILLIN RESISTANT STAPHYLOCOCCUS AUREUS  Final      Susceptibility   Methicillin resistant staphylococcus aureus - MIC*    CIPROFLOXACIN <=0.5 SENSITIVE Sensitive     ERYTHROMYCIN >=8 RESISTANT Resistant     GENTAMICIN <=0.5 SENSITIVE Sensitive     OXACILLIN >=4 RESISTANT Resistant     TETRACYCLINE <=1 SENSITIVE Sensitive     VANCOMYCIN <=0.5 SENSITIVE Sensitive     TRIMETH/SULFA 80 RESISTANT Resistant     CLINDAMYCIN <=0.25 SENSITIVE Sensitive     RIFAMPIN <=0.5 SENSITIVE Sensitive     Inducible Clindamycin NEGATIVE Sensitive     * FEW METHICILLIN RESISTANT STAPHYLOCOCCUS AUREUS  Surgical pcr screen     Status: None   Collection Time: 11/20/19  5:41 PM   Specimen: Nasal Mucosa; Nasal Swab  Result Value Ref Range Status   MRSA, PCR NEGATIVE NEGATIVE Final   Staphylococcus aureus NEGATIVE NEGATIVE Final    Comment: (NOTE) The Xpert SA Assay (FDA approved for NASAL specimens in patients 40 years of age and older), is one component of a comprehensive surveillance program. It is not intended to diagnose infection nor to guide or monitor treatment. Performed at [facility], [place] [address], [place], [place] [address]     Impression/Plan:  1. Facial abscess with cellulitis - MRSA on swab culture.  Tolerating vancomycin.  Can go home on doxycycline for an additional 7 days at discharge.   2.  Medication monitoring - creat has improved. Will continue to monitor.     3.  Disposition - Treatment as above.  Has follow up with me in person 6/21 at 2pm.    I will sign off, call with questions

## 2019-11-23 NOTE — Plan of Care (Signed)

## 2019-11-23 NOTE — Progress Notes (Signed)
PROGRESS NOTE    Sabrina Baker  GEZ:[id number] DOB: 1940-07-03 DOA: 11/19/2019 PCP: [name], MD    Brief Narrative:  Sabrina Baker is a 79 y.o. female with medical history significant of chronic kidney disease stage Baker, Sabrina Baker, Sabrina Baker, Sabrina Baker, Sabrina Baker.     Consultants:   ID , ENT  Procedures:i/d in Er  Antimicrobials:   vanco   Subjective: Feels much better. No sob, no other complaints Objective: Vitals:   11/22/19 1559 11/22/19 1932 11/22/19 2026 11/23/19 0311  BP: (!) 149/67 (!) 176/75 (!) 147/69 (!) 159/67  Pulse: 70 72 71 81  Resp: 18 14  14   Temp: 98.6 F (37 C) 98.4 F (36.9 C)  98.3 F (36.8 C)  TempSrc:  Oral  Oral  SpO2: 94% 95%  91%  Weight:      Height:        Intake/Output Summary (Last 24 hours) at 11/23/2019 0808 Last data filed at 11/22/2019 0900 Gross per 24 hour  Intake 240 ml  Output --  Net 240 ml   Filed Weights   11/20/19 2300  Weight: 76.3 kg    Examination:  General exam: Appears calm and comfortable, nad HEENT: no facial swelling. Both eyes open now. Erythema improved. Forehead dressing inplace I did not remove it as it was changed today Respiratory system: Clear to auscultation. Respiratory effort normal.no r/w/r Cardiovascular system: S1 & S2 heard, RRR. No JVD, murmurs, rubs, gallops or clicks.  Gastrointestinal system: Abdomen is nondistended, soft and nontender.. Normal bowel sounds heard. Central nervous system: Alert and oriented x3.  Grossly intact Extremities: no edema Skin: Warm dry Psychiatry: Judgement and insight appear normal. Mood & affect appropriate.     Data Reviewed: I have personally reviewed following labs and imaging studies  CBC: Recent Labs  Lab 11/19/19 1326 11/20/19 0339 11/21/19 0409 11/22/19 0447  WBC 24.Baker* 24.0* 20.1* 19.1*  NEUTROABS 20.7*   --   --   --   HGB 12.1 11.6* 11.9* 11.Baker*  HCT 39.Baker 38.1 39.4 36.5  MCV 96.8 97.9 98.0 97.1  PLT 317 283 311 650   Basic Metabolic Panel: Recent Labs  Lab 11/19/19 1326 11/20/19 0339 11/21/19 0409  NA 136 141 139  K 5.Baker* 5.1 4.7  CL 102 103 105  CO2 23 27 25   GLUCOSE 216* 46* 65*  BUN 30* 23 15  CREATININE 1.61* 1.23* 1.03*  CALCIUM 8.9 8.4* 8.4*   GFR: Estimated Creatinine Clearance: 44.1 mL/min (A) (by C-G formula based on SCr of 1.03 mg/dL (H)). Liver Function Tests: Recent Labs  Lab 11/19/19 1326  AST 15  ALT 14  ALKPHOS 66  BILITOT 0.5  PROT 6.6  ALBUMIN Baker.9*   No results for input(s): LIPASE, AMYLASE in the last 168 hours. No results for input(s): AMMONIA in the last 168 hours. Coagulation Profile: No results for input(s): INR, PROTIME in the last 168 hours. Cardiac Enzymes: No results for input(s): CKTOTAL, CKMB, CKMBINDEX, TROPONINI in the last 168 hours. BNP (last 3 results) No results for input(s): PROBNP in the last 8760 hours. HbA1C: No results for input(s): HGBA1C in the last 72 hours. CBG: Recent Labs  Lab 11/22/19 1558 11/22/19 1932 11/22/19 2337 11/23/19 0406 11/23/19 0719  GLUCAP 165* 179* 244* 151* 151*   Lipid Profile: No results for input(s): CHOL, HDL, LDLCALC, TRIG, CHOLHDL, LDLDIRECT in the last 72 hours. Thyroid  Function Tests: No results for input(s): TSH, T4TOTAL, FREET4, T3FREE, THYROIDAB in the last 72 hours. Anemia Panel: No results for input(s): VITAMINB12, FOLATE, FERRITIN, TIBC, IRON, RETICCTPCT in the last 72 hours. Sepsis Labs: Recent Labs  Lab 11/19/19 1326 11/19/19 1602  LATICACIDVEN 1.0 1.1    Recent Results (from the past 240 hour(s))  Culture, blood (routine x Baker)     Status: None (Preliminary result)   Collection Time: 11/19/19  4:02 PM   Specimen: BLOOD  Result Value Ref Range Status   Specimen Description BLOOD RIGHT ANTECUBITAL  Final   Special Requests   Final    BOTTLES DRAWN AEROBIC AND ANAEROBIC  Blood Culture adequate volume   Culture   Final    NO GROWTH 4 DAYS Performed at [facility], [address], [place], [place] [address]    Report Status PENDING  Incomplete  Culture, blood (routine x Baker)     Status: None (Preliminary result)   Collection Time: 11/19/19  4:45 PM   Specimen: BLOOD  Result Value Ref Range Status   Specimen Description BLOOD SITE NOT SPECIFIED  Final   Special Requests   Final    BOTTLES DRAWN AEROBIC AND ANAEROBIC Blood Culture results may not be optimal due to an inadequate volume of blood received in culture bottles   Culture   Final    NO GROWTH 4 DAYS Performed at [facility], [place] [address], [place], [place] [address]    Report Status PENDING  Incomplete  SARS Coronavirus Baker by RT PCR (hospital order, performed in [facility] hospital lab) Nasopharyngeal Urine, Clean Catch     Status: None   Collection Time: 11/19/19  4:45 PM   Specimen: Urine, Clean Catch; Nasopharyngeal  Result Value Ref Range Status   SARS Coronavirus Baker NEGATIVE NEGATIVE Final    Comment: (NOTE) SARS-CoV-Baker target nucleic acids are NOT DETECTED. The SARS-CoV-Baker RNA is generally detectable in upper and lower respiratory specimens during the acute phase of infection. The lowest concentration of SARS-CoV-Baker viral copies this assay can detect is 250 copies / mL. A negative result does not preclude SARS-CoV-Baker infection and should not be used as the sole basis for treatment or other patient management decisions.  A negative result may occur with improper specimen collection / handling, submission of specimen other than nasopharyngeal swab, presence of viral mutation(s) within the areas targeted by this assay, and inadequate number of viral copies (<250 copies / mL). A negative result must be combined with clinical observations, patient history, and epidemiological information. Fact Sheet for Patients:   StrictlyIdeas.no Fact Sheet for Healthcare  Providers: BankingDealers.co.za This test is not yet approved or cleared  by the [place] FDA and has been authorized for detection and/or diagnosis of SARS-CoV-Baker by FDA under an Emergency Use Authorization (EUA).  This EUA will remain in effect (meaning this test can be used) for the duration of the COVID-19 declaration under Section 564(b)(1) of the Act, 21 U.S.C. section 360bbb-3(b)(1), unless the authorization is terminated or revoked sooner. Performed at [facility], [place] [address], [place], [place] [address]   Wound or Superficial Culture     Status: None   Collection Time: 11/19/19  5:40 PM   Specimen: Wound  Result Value Ref Range Status   Specimen Description WOUND  Final   Special Requests FOREHEAD  Final   Gram Stain   Final    RARE WBC PRESENT, PREDOMINANTLY PMN FEW GRAM POSITIVE COCCI IN  PAIRS IN CLUSTERS Performed at [facility], [place] [address], [place], [place] [address]    Culture FEW METHICILLIN RESISTANT STAPHYLOCOCCUS AUREUS  Final   Report Status 11/22/2019 FINAL  Final   Organism ID, Bacteria METHICILLIN RESISTANT STAPHYLOCOCCUS AUREUS  Final      Susceptibility   Methicillin resistant staphylococcus aureus - MIC*    CIPROFLOXACIN <=0.5 SENSITIVE Sensitive     ERYTHROMYCIN >=8 RESISTANT Resistant     GENTAMICIN <=0.5 SENSITIVE Sensitive     OXACILLIN >=4 RESISTANT Resistant     TETRACYCLINE <=1 SENSITIVE Sensitive     VANCOMYCIN <=0.5 SENSITIVE Sensitive     TRIMETH/SULFA 80 RESISTANT Resistant     CLINDAMYCIN <=0.25 SENSITIVE Sensitive     RIFAMPIN <=0.5 SENSITIVE Sensitive     Inducible Clindamycin NEGATIVE Sensitive     * FEW METHICILLIN RESISTANT STAPHYLOCOCCUS AUREUS  Surgical pcr screen     Status: None   Collection Time: 11/20/19  5:41 PM   Specimen: Nasal Mucosa; Nasal Swab  Result Value Ref Range Status   MRSA, PCR NEGATIVE NEGATIVE Final   Staphylococcus aureus NEGATIVE NEGATIVE Final    Comment:  (NOTE) The Xpert SA Assay (FDA approved for NASAL specimens in patients 65 years of age and older), is one component of a comprehensive surveillance program. It is not intended to diagnose infection nor to guide or monitor treatment. Performed at [facility], [place] [address], [place], [place] [address]          Radiology Studies: No results found.      Scheduled Meds:  atorvastatin  10 mg Oral QHS   carvedilol  12.5 mg Oral BID WC   DULoxetine  60 mg Oral Daily   ferrous sulfate  325 mg Oral TID WC   gabapentin  100 mg Oral QHS   insulin aspart  0-15 Units Subcutaneous Q4H   insulin glargine  3 Units Subcutaneous Daily   ketoconazole  1 application Topical Once per day on Mon Thu   latanoprost  1 drop Left Eye QHS   magnesium oxide  400 mg Oral Daily   pantoprazole  40 mg Oral BID   Continuous Infusions:  vancomycin 1,250 mg (11/22/19 1003)    Assessment & Plan:   Principal Problem:   Staph aureus infection Active Problems:   Cellulitis  1.facial cellulitis with Baker-secondary to using razor to cut her hair wbc decreasing Clinically improving  status post I/D in ER, wound cx staph.A, suscibility pending ENT input was appreciated. CT max completed, see result. S/p I/D by ENT on 6/11-Plan drain removal when output decreases. ID following -culture from the ED now with MRSA. Continue vancomycin for now and Can go home on doxycycline for an additional 7 days at discharge.  Hold Eliquis for now until cleared by ENT to resume JP in place with decrease output-plan to remove drain on 6/14 by ENT F/u with ID on 6/21 at 2pm.   Baker.DM Type2 Hold home dose medication RISS, fs lantus was d/c 'd due to low bg, now bg elevated, will resume at 3units daily  HA1c 8.9  3.Acute on CKD stage Baker-creatinine mildly elevated. Possibly prerenal as she has been having fevers Improved with hydration, cr today is 1.03 Continue gentle hydration. Renal  function  4. PAF- on beta blk and eliquis Will hole a/c for I/D today, resume when cleared by ENT Continue with coreg   5.HTN- bp meds held initially due to low bp now improved,  continue  Coreg 12.5 mg p.o. twice daily increased to 25 mg home dose as tolerated    6.Hyperkalemia-mild. Likely from #3. Start IVF was give kayexalate15gm x1 on 6/9 K now 4.7 Continue to monitor  6.  Sabrina Baker-continue home statin    DVT prophylaxis: Eiquis.Marland Kitchen> now SCD Code Status: full  Family Communication: by bedside Disposition Plan: back home when stable  Barrier: Patient currently not medically stable for discharge as she requires IV antibiotics, febrile, and needs drain to be discontinued on monday Anticipated discharge date likely in Baker days      LOS: 4 days   Time spent: 45 min with >50% on coc    Nolberto Hanlon, MD [facility] Pager [phone]  If 7PM-7AM, please contact night-coverage [email or web address] Password [facility] 11/23/2019, 8:08 AM

## 2019-11-24 LAB — CULTURE, BLOOD (ROUTINE X 2)
Culture: NO GROWTH
Culture: NO GROWTH
Special Requests: ADEQUATE

## 2019-11-24 LAB — CBC
HCT: 36.5 % (ref 36.0–46.0)
Hemoglobin: 11.2 g/dL — ABNORMAL LOW (ref 12.0–15.0)
MCH: 29.9 pg (ref 26.0–34.0)
MCHC: 30.7 g/dL (ref 30.0–36.0)
MCV: 97.6 fL (ref 80.0–100.0)
Platelets: 330 10*3/uL (ref 150–400)
RBC: 3.74 MIL/uL — ABNORMAL LOW (ref 3.87–5.11)
RDW: 15.9 % — ABNORMAL HIGH (ref 11.5–15.5)
WBC: 12.4 10*3/uL — ABNORMAL HIGH (ref 4.0–10.5)
nRBC: 0 % (ref 0.0–0.2)

## 2019-11-24 LAB — BASIC METABOLIC PANEL
Anion gap: 7 (ref 5–15)
BUN: 22 mg/dL (ref 8–23)
CO2: 26 mmol/L (ref 22–32)
Calcium: 8.4 mg/dL — ABNORMAL LOW (ref 8.9–10.3)
Chloride: 105 mmol/L (ref 98–111)
Creatinine, Ser: 1 mg/dL (ref 0.44–1.00)
GFR calc Af Amer: >60 mL/min (ref >60)
GFR calc non Af Amer: 54 mL/min — ABNORMAL LOW (ref >60)
Glucose, Bld: 211 mg/dL — ABNORMAL HIGH (ref 70–99)
Potassium: 5.3 mmol/L — ABNORMAL HIGH (ref 3.5–5.1)
Sodium: 138 mmol/L (ref 135–145)

## 2019-11-24 LAB — GLUCOSE, CAPILLARY
Glucose-Capillary: 163 mg/dL — ABNORMAL HIGH (ref 70–99)
Glucose-Capillary: 197 mg/dL — ABNORMAL HIGH (ref 70–99)
Glucose-Capillary: 226 mg/dL — ABNORMAL HIGH (ref 70–99)
Glucose-Capillary: 183 mg/dL — ABNORMAL HIGH (ref 70–99)
Glucose-Capillary: 147 mg/dL — ABNORMAL HIGH (ref 70–99)

## 2019-11-24 MED ORDER — SODIUM POLYSTYRENE SULFONATE 15 GM/60ML PO SUSP
30.0000 g | Freq: Once | ORAL | Status: AC
  Administered 2019-11-24: 30 g via ORAL
  Filled 2019-11-24: qty 120

## 2019-11-24 MED ORDER — MUPIROCIN CALCIUM 2 % EX CREA
TOPICAL_CREAM | Freq: Two times a day (BID) | CUTANEOUS | Status: DC
  Filled 2019-11-24: qty 15

## 2019-11-24 MED ORDER — CARVEDILOL 25 MG PO TABS
25.0000 mg | ORAL_TABLET | Freq: Two times a day (BID) | ORAL | Status: DC
  Administered 2019-11-24 – 2019-11-25 (×2): 25 mg via ORAL
  Filled 2019-11-24 (×2): qty 1

## 2019-11-24 MED ORDER — PHENOL 1.4 % MT LIQD
1.0000 | OROMUCOSAL | Status: DC | PRN
  Filled 2019-11-24: qty 177

## 2019-11-24 NOTE — Progress Notes (Signed)
PROGRESS NOTE    Sabrina Baker  [id number]:[id number] DOB: [birth year] DOA: 11/19/2019 PCP: [name], MD    Brief Narrative:  Sabrina Baker is a 79 y.o. female with medical history significant of chronic kidney disease stage III, diabetes mellitus type 2, paroxysmal atrial fibrillation, hypercholesterolemia, hypertension was sent by her primary care today for treatment of facial cellulitis and abscess.     Consultants:   ID , ENT  Procedures:i/d in Er  Antimicrobials:   vanco   Subjective: No sob, no other complaints. States feels forehead still oozing after drain taken out Objective: Vitals:   11/23/19 2008 11/24/19 0324 11/24/19 0801 11/24/19 1300  BP: (!) 142/77 (!) 188/88 (!) 159/84 (!) 148/77  Pulse: 78 87 95 92  Resp: 15 14 17 16   Temp: 98.2 F (36.8 C) 99.7 F (37.6 C) 98.2 F (36.8 C) 98.3 F (36.8 C)  TempSrc: Oral Oral Oral Oral  SpO2: 93% 95% 97% 95%  Weight:      Height:        Intake/Output Summary (Last 24 hours) at 11/24/2019 1604 Last data filed at 11/24/2019 1300 Gross per 24 hour  Intake 1710 ml  Output --  Net 1710 ml   Filed Weights   11/20/19 2300  Weight: 76.3 kg    Examination:  General exam: Appears calm and comfortable, nad HEENT: no facial swelling.  Erythema of face improved forehead dressing inplace, losing bloody fluid .  Behind the right ear no oozing from dressing respiratory system: Clear to auscultation. Respiratory effort normal.no r/w/r Cardiovascular system: S1 & S2 heard, RRR. No JVD, murmurs, rubs, gallops or clicks.  Gastrointestinal system: Abdomen is nondistended, soft and nontender.. Normal bowel sounds heard. Central nervous system: Alert and oriented x3.  Grossly intact Extremities: no edema Skin: Warm dry Psychiatry: Judgement and insight appear normal. Mood & affect appropriate.     Data Reviewed: I have personally reviewed following labs and imaging studies  CBC: Recent Labs  Lab 11/19/19 1326  11/20/19 0339 11/21/19 0409 11/22/19 0447 11/24/19 0142  WBC 24.2* 24.0* 20.1* 19.1* 12.4*  NEUTROABS 20.7*  --   --   --   --   HGB 12.1 11.6* 11.9* 11.2* 11.2*  HCT 39.2 38.1 39.4 36.5 36.5  MCV 96.8 97.9 98.0 97.1 97.6  PLT 317 283 311 314 810   Basic Metabolic Panel: Recent Labs  Lab 11/19/19 1326 11/20/19 0339 11/21/19 0409 11/24/19 0142  NA 136 141 139 138  K 5.2* 5.1 4.7 5.3*  CL 102 103 105 105  CO2 23 27 25 26   GLUCOSE 216* 46* 65* 211*  BUN 30* 23 15 22   CREATININE 1.61* 1.23* 1.03* 1.00  CALCIUM 8.9 8.4* 8.4* 8.4*   GFR: Estimated Creatinine Clearance: 45.4 mL/min (by C-G formula based on SCr of 1 mg/dL). Liver Function Tests: Recent Labs  Lab 11/19/19 1326  AST 15  ALT 14  ALKPHOS 66  BILITOT 0.5  PROT 6.6  ALBUMIN 2.9*   No results for input(s): LIPASE, AMYLASE in the last 168 hours. No results for input(s): AMMONIA in the last 168 hours. Coagulation Profile: No results for input(s): INR, PROTIME in the last 168 hours. Cardiac Enzymes: No results for input(s): CKTOTAL, CKMB, CKMBINDEX, TROPONINI in the last 168 hours. BNP (last 3 results) No results for input(s): PROBNP in the last 8760 hours. HbA1C: No results for input(s): HGBA1C in the last 72 hours. CBG: Recent Labs  Lab 11/23/19 2009 11/23/19 2321 11/24/19 0326 11/24/19 0757  11/24/19 1123  GLUCAP 188* 85 163* 197* 226*   Lipid Profile: No results for input(s): CHOL, HDL, LDLCALC, TRIG, CHOLHDL, LDLDIRECT in the last 72 hours. Thyroid Function Tests: No results for input(s): TSH, T4TOTAL, FREET4, T3FREE, THYROIDAB in the last 72 hours. Anemia Panel: No results for input(s): VITAMINB12, FOLATE, FERRITIN, TIBC, IRON, RETICCTPCT in the last 72 hours. Sepsis Labs: Recent Labs  Lab 11/19/19 1326 11/19/19 1602  LATICACIDVEN 1.0 1.1    Recent Results (from the past 240 hour(s))  Culture, blood (routine x 2)     Status: None   Collection Time: 11/19/19  4:02 PM   Specimen: BLOOD   Result Value Ref Range Status   Specimen Description BLOOD RIGHT ANTECUBITAL  Final   Special Requests   Final    BOTTLES DRAWN AEROBIC AND ANAEROBIC Blood Culture adequate volume   Culture   Final    NO GROWTH 5 DAYS Performed at [facility], [address] [place], [place] [address]    Report Status 11/24/2019 FINAL  Final  Culture, blood (routine x 2)     Status: None   Collection Time: 11/19/19  4:45 PM   Specimen: BLOOD  Result Value Ref Range Status   Specimen Description BLOOD SITE NOT SPECIFIED  Final   Special Requests   Final    BOTTLES DRAWN AEROBIC AND ANAEROBIC Blood Culture results may not be optimal due to an inadequate volume of blood received in culture bottles   Culture   Final    NO GROWTH 5 DAYS Performed at [facility], [place] [address], [place], [place] [address]    Report Status 11/24/2019 FINAL  Final  SARS Coronavirus 2 by RT PCR (hospital order, performed in [facility] hospital lab) Nasopharyngeal Urine, Clean Catch     Status: None   Collection Time: 11/19/19  4:45 PM   Specimen: Urine, Clean Catch; Nasopharyngeal  Result Value Ref Range Status   SARS Coronavirus 2 NEGATIVE NEGATIVE Final    Comment: (NOTE) SARS-CoV-2 target nucleic acids are NOT DETECTED. The SARS-CoV-2 RNA is generally detectable in upper and lower respiratory specimens during the acute phase of infection. The lowest concentration of SARS-CoV-2 viral copies this assay can detect is 250 copies / mL. A negative result does not preclude SARS-CoV-2 infection and should not be used as the sole basis for treatment or other patient management decisions.  A negative result may occur with improper specimen collection / handling, submission of specimen other than nasopharyngeal swab, presence of viral mutation(s) within the areas targeted by this assay, and inadequate number of viral copies (<250 copies / mL). A negative result must be combined with clinical observations,  patient history, and epidemiological information. Fact Sheet for Patients:   StrictlyIdeas.no Fact Sheet for Healthcare Providers: BankingDealers.co.za This test is not yet approved or cleared  by the [place] FDA and has been authorized for detection and/or diagnosis of SARS-CoV-2 by FDA under an Emergency Use Authorization (EUA).  This EUA will remain in effect (meaning this test can be used) for the duration of the COVID-19 declaration under Section 564(b)(1) of the Act, 21 U.S.C. section 360bbb-3(b)(1), unless the authorization is terminated or revoked sooner. Performed at [facility], [place] [address], [place], [place] [address]   Wound or Superficial Culture     Status: None   Collection Time: 11/19/19  5:40 PM   Specimen: Wound  Result Value Ref Range Status   Specimen Description WOUND  Final  Special Requests FOREHEAD  Final   Gram Stain   Final    RARE WBC PRESENT, PREDOMINANTLY PMN FEW GRAM POSITIVE COCCI IN PAIRS IN CLUSTERS Performed at [facility], [place] [address] [place], [place] [address]    Culture FEW METHICILLIN RESISTANT STAPHYLOCOCCUS AUREUS  Final   Report Status 11/22/2019 FINAL  Final   Organism ID, Bacteria METHICILLIN RESISTANT STAPHYLOCOCCUS AUREUS  Final      Susceptibility   Methicillin resistant staphylococcus aureus - MIC*    CIPROFLOXACIN <=0.5 SENSITIVE Sensitive     ERYTHROMYCIN >=8 RESISTANT Resistant     GENTAMICIN <=0.5 SENSITIVE Sensitive     OXACILLIN >=4 RESISTANT Resistant     TETRACYCLINE <=1 SENSITIVE Sensitive     VANCOMYCIN <=0.5 SENSITIVE Sensitive     TRIMETH/SULFA 80 RESISTANT Resistant     CLINDAMYCIN <=0.25 SENSITIVE Sensitive     RIFAMPIN <=0.5 SENSITIVE Sensitive     Inducible Clindamycin NEGATIVE Sensitive     * FEW METHICILLIN RESISTANT STAPHYLOCOCCUS AUREUS  Surgical pcr screen     Status: None   Collection Time: 11/20/19  5:41 PM   Specimen: Nasal Mucosa;  Nasal Swab  Result Value Ref Range Status   MRSA, PCR NEGATIVE NEGATIVE Final   Staphylococcus aureus NEGATIVE NEGATIVE Final    Comment: (NOTE) The Xpert SA Assay (FDA approved for NASAL specimens in patients 32 years of age and older), is one component of a comprehensive surveillance program. It is not intended to diagnose infection nor to guide or monitor treatment. Performed at [facility], [place] [address], [place], [place] [address]          Radiology Studies: No results found.      Scheduled Meds: . atorvastatin  10 mg Oral QHS  . carvedilol  12.5 mg Oral BID WC  . DULoxetine  60 mg Oral Daily  . ferrous sulfate  325 mg Oral TID WC  . gabapentin  100 mg Oral QHS  . insulin aspart  0-15 Units Subcutaneous Q4H  . insulin glargine  3 Units Subcutaneous Daily  . ketoconazole  1 application Topical Once per day on Mon Thu  . latanoprost  1 drop Left Eye QHS  . magnesium oxide  400 mg Oral Daily  . mupirocin cream   Topical BID  . pantoprazole  40 mg Oral BID   Continuous Infusions: . vancomycin 1,250 mg (11/24/19 8937)    Assessment & Plan:   Principal Problem:   Staph aureus infection Active Problems:   Cellulitis  1.facial cellulitis with abscess-secondary to using razor to cut her hair wbc decreasing Clinically improving  status post I/D in ER, wound cx staph.A, suscibility pending ENT input was appreciated. CT max completed, see result. S/p I/D by ENT on 6/11-Plan drain removal when output decreases. ID following -culture from the ED now with MRSA. Continue vancomycin for now and Can go home on doxycycline for an additional 7 days at discharge.  Hold Eliquis for now until cleared by ENT to resume JP in place with decrease output-plan to remove drain on 6/14 by ENT F/u with ID on 6/21 at 2pm. Plan: If bleed from surgical site improves and stable will d/c in am  2.DM Type2 Hold home dose medication RISS, fs lantus was d/c 'd due to low bg,  now bg elevated, will resume at 3units daily  HA1c 8.9  3.Acute on CKD stage III-creatinine mildly elevated. Possibly prerenal as she has been having fevers Improved with hydration, cr  today is 1.0p    4. PAF- on beta blk and eliquis Will hole a/c for I/D today, resume when cleared by ENT Continue with coreg   5.HTN- bp meds held initially due to low bp now improved,  continue Coreg 12.5 mg p.o. twice daily, will  increased to 25 mg   6.Hyperkalemia-mild. Likely from #3. given kayexalate15gm x1 on 6/9 K now 5.3 Will give another kayexlate and low potassium diet.  Per pt she has issues with this as outpt.    6.  Hypercholesterolemia-continue home statin    DVT prophylaxis: Eiquis.Marland Kitchen> now SCD Code Status: full  Family Communication: by bedside Disposition Plan: back home when stable  Barrier: Patient currently not medically stable for discharge as she requires IV antibiotics, bleeding from surgical site. If stable by am, will dc in am Anticipated discharge date likely in `1-2 days      LOS: 5 days   Time spent: 45 min with >50% on coc    Nolberto Hanlon, MD [facility] Pager [phone]  If 7PM-7AM, please contact night-coverage [email or web address] Password [email or web address] 11/24/2019, 4:04 PM   PROGRESS NOTE    Sabrina Baker  WJX:[id number] DOB: November 27, 1940 DOA: 11/19/2019 PCP: [name], MD    Brief Narrative:  Sabrina Baker is a 79 y.o. female with medical history significant of chronic kidney disease stage III, diabetes mellitus type 2, paroxysmal atrial fibrillation, hypercholesterolemia, hypertension was sent by her primary care today for treatment of facial cellulitis and abscess.     Consultants:   ID , ENT  Procedures:i/d in Er  Antimicrobials:   vanco   Subjective: Feels much better. No sob, no other complaints Objective: Vitals:   11/23/19 2008 11/24/19 0324 11/24/19 0801 11/24/19 1300  BP: (!) 142/77 (!) 188/88 (!) 159/84 (!) 148/77   Pulse: 78 87 95 92  Resp: 15 14 17 16   Temp: 98.2 F (36.8 C) 99.7 F (37.6 C) 98.2 F (36.8 C) 98.3 F (36.8 C)  TempSrc: Oral Oral Oral Oral  SpO2: 93% 95% 97% 95%  Weight:      Height:        Intake/Output Summary (Last 24 hours) at 11/24/2019 1605 Last data filed at 11/24/2019 1300 Gross per 24 hour  Intake 1710 ml  Output --  Net 1710 ml   Filed Weights   11/20/19 2300  Weight: 76.3 kg    Examination:  General exam: Appears calm and comfortable, nad HEENT: no facial swelling. Both eyes open now. Erythema improved. Forehead dressing inplace I did not remove it as it was changed today Respiratory system: Clear to auscultation. Respiratory effort normal.no r/w/r Cardiovascular system: S1 & S2 heard, RRR. No JVD, murmurs, rubs, gallops or clicks.  Gastrointestinal system: Abdomen is nondistended, soft and nontender.. Normal bowel sounds heard. Central nervous system: Alert and oriented x3.  Grossly intact Extremities: no edema Skin: Warm dry Psychiatry: Judgement and insight appear normal. Mood & affect appropriate.     Data Reviewed: I have personally reviewed following labs and imaging studies  CBC: Recent Labs  Lab 11/19/19 1326 11/20/19 0339 11/21/19 0409 11/22/19 0447 11/24/19 0142  WBC 24.2* 24.0* 20.1* 19.1* 12.4*  NEUTROABS 20.7*  --   --   --   --   HGB 12.1 11.6* 11.9* 11.2* 11.2*  HCT 39.2 38.1 39.4 36.5 36.5  MCV 96.8 97.9 98.0 97.1 97.6  PLT 317 283 311 314 213   Basic Metabolic Panel: Recent Labs  Lab 11/19/19 1326 11/20/19 0339 11/21/19  0409 11/24/19 0142  NA 136 141 139 138  K 5.2* 5.1 4.7 5.3*  CL 102 103 105 105  CO2 23 27 25 26   GLUCOSE 216* 46* 65* 211*  BUN 30* 23 15 22   CREATININE 1.61* 1.23* 1.03* 1.00  CALCIUM 8.9 8.4* 8.4* 8.4*   GFR: Estimated Creatinine Clearance: 45.4 mL/min (by C-G formula based on SCr of 1 mg/dL). Liver Function Tests: Recent Labs  Lab 11/19/19 1326  AST 15  ALT 14  ALKPHOS 66  BILITOT 0.5   PROT 6.6  ALBUMIN 2.9*   No results for input(s): LIPASE, AMYLASE in the last 168 hours. No results for input(s): AMMONIA in the last 168 hours. Coagulation Profile: No results for input(s): INR, PROTIME in the last 168 hours. Cardiac Enzymes: No results for input(s): CKTOTAL, CKMB, CKMBINDEX, TROPONINI in the last 168 hours. BNP (last 3 results) No results for input(s): PROBNP in the last 8760 hours. HbA1C: No results for input(s): HGBA1C in the last 72 hours. CBG: Recent Labs  Lab 11/23/19 2009 11/23/19 2321 11/24/19 0326 11/24/19 0757 11/24/19 1123  GLUCAP 188* 85 163* 197* 226*   Lipid Profile: No results for input(s): CHOL, HDL, LDLCALC, TRIG, CHOLHDL, LDLDIRECT in the last 72 hours. Thyroid Function Tests: No results for input(s): TSH, T4TOTAL, FREET4, T3FREE, THYROIDAB in the last 72 hours. Anemia Panel: No results for input(s): VITAMINB12, FOLATE, FERRITIN, TIBC, IRON, RETICCTPCT in the last 72 hours. Sepsis Labs: Recent Labs  Lab 11/19/19 1326 11/19/19 1602  LATICACIDVEN 1.0 1.1    Recent Results (from the past 240 hour(s))  Culture, blood (routine x 2)     Status: None   Collection Time: 11/19/19  4:02 PM   Specimen: BLOOD  Result Value Ref Range Status   Specimen Description BLOOD RIGHT ANTECUBITAL  Final   Special Requests   Final    BOTTLES DRAWN AEROBIC AND ANAEROBIC Blood Culture adequate volume   Culture   Final    NO GROWTH 5 DAYS Performed at [facility], [address], [place], [place] [address]    Report Status 11/24/2019 FINAL  Final  Culture, blood (routine x 2)     Status: None   Collection Time: 11/19/19  4:45 PM   Specimen: BLOOD  Result Value Ref Range Status   Specimen Description BLOOD SITE NOT SPECIFIED  Final   Special Requests   Final    BOTTLES DRAWN AEROBIC AND ANAEROBIC Blood Culture results may not be optimal due to an inadequate volume of blood received in culture bottles   Culture   Final    NO GROWTH 5  DAYS Performed at [facility], [place] [address] [place], [place] [address]    Report Status 11/24/2019 FINAL  Final  SARS Coronavirus 2 by RT PCR (hospital order, performed in [facility] hospital lab) Nasopharyngeal Urine, Clean Catch     Status: None   Collection Time: 11/19/19  4:45 PM   Specimen: Urine, Clean Catch; Nasopharyngeal  Result Value Ref Range Status   SARS Coronavirus 2 NEGATIVE NEGATIVE Final    Comment: (NOTE) SARS-CoV-2 target nucleic acids are NOT DETECTED. The SARS-CoV-2 RNA is generally detectable in upper and lower respiratory specimens during the acute phase of infection. The lowest concentration of SARS-CoV-2 viral copies this assay can detect is 250 copies / mL. A negative result does not preclude SARS-CoV-2 infection and should not be used as the sole basis for treatment or other patient management decisions.  A negative  result may occur with improper specimen collection / handling, submission of specimen other than nasopharyngeal swab, presence of viral mutation(s) within the areas targeted by this assay, and inadequate number of viral copies (<250 copies / mL). A negative result must be combined with clinical observations, patient history, and epidemiological information. Fact Sheet for Patients:   StrictlyIdeas.no Fact Sheet for Healthcare Providers: BankingDealers.co.za This test is not yet approved or cleared  by the [place] FDA and has been authorized for detection and/or diagnosis of SARS-CoV-2 by FDA under an Emergency Use Authorization (EUA).  This EUA will remain in effect (meaning this test can be used) for the duration of the COVID-19 declaration under Section 564(b)(1) of the Act, 21 U.S.C. section 360bbb-3(b)(1), unless the authorization is terminated or revoked sooner. Performed at [facility], [place] [address], [place], [place] [address]   Wound or Superficial Culture      Status: None   Collection Time: 11/19/19  5:40 PM   Specimen: Wound  Result Value Ref Range Status   Specimen Description WOUND  Final   Special Requests FOREHEAD  Final   Gram Stain   Final    RARE WBC PRESENT, PREDOMINANTLY PMN FEW GRAM POSITIVE COCCI IN PAIRS IN CLUSTERS Performed at [facility], [address] [place], [place] [address]    Culture FEW METHICILLIN RESISTANT STAPHYLOCOCCUS AUREUS  Final   Report Status 11/22/2019 FINAL  Final   Organism ID, Bacteria METHICILLIN RESISTANT STAPHYLOCOCCUS AUREUS  Final      Susceptibility   Methicillin resistant staphylococcus aureus - MIC*    CIPROFLOXACIN <=0.5 SENSITIVE Sensitive     ERYTHROMYCIN >=8 RESISTANT Resistant     GENTAMICIN <=0.5 SENSITIVE Sensitive     OXACILLIN >=4 RESISTANT Resistant     TETRACYCLINE <=1 SENSITIVE Sensitive     VANCOMYCIN <=0.5 SENSITIVE Sensitive     TRIMETH/SULFA 80 RESISTANT Resistant     CLINDAMYCIN <=0.25 SENSITIVE Sensitive     RIFAMPIN <=0.5 SENSITIVE Sensitive     Inducible Clindamycin NEGATIVE Sensitive     * FEW METHICILLIN RESISTANT STAPHYLOCOCCUS AUREUS  Surgical pcr screen     Status: None   Collection Time: 11/20/19  5:41 PM   Specimen: Nasal Mucosa; Nasal Swab  Result Value Ref Range Status   MRSA, PCR NEGATIVE NEGATIVE Final   Staphylococcus aureus NEGATIVE NEGATIVE Final    Comment: (NOTE) The Xpert SA Assay (FDA approved for NASAL specimens in patients 82 years of age and older), is one component of a comprehensive surveillance program. It is not intended to diagnose infection nor to guide or monitor treatment. Performed at [facility], [place] [address], [place], [place] [address]          Radiology Studies: No results found.      Scheduled Meds: . atorvastatin  10 mg Oral QHS  . carvedilol  12.5 mg Oral BID WC  . DULoxetine  60 mg Oral Daily  . ferrous sulfate  325 mg Oral TID WC  . gabapentin  100 mg Oral QHS  . insulin aspart  0-15 Units  Subcutaneous Q4H  . insulin glargine  3 Units Subcutaneous Daily  . ketoconazole  1 application Topical Once per day on Mon Thu  . latanoprost  1 drop Left Eye QHS  . magnesium oxide  400 mg Oral Daily  . mupirocin cream   Topical BID  . pantoprazole  40 mg Oral BID   Continuous Infusions: . vancomycin 1,250 mg (11/24/19  0922)    Assessment & Plan:   Principal Problem:   Staph aureus infection Active Problems:   Cellulitis  1.facial cellulitis with abscess-secondary to using razor to cut her hair wbc decreasing Clinically improving  status post I/D in ER, wound cx staph.A, suscibility pending ENT input was appreciated. CT max completed, see result. S/p I/D by ENT on 6/11-Plan drain removal when output decreases. ID following -culture from the ED now with MRSA. Continue vancomycin for now and Can go home on doxycycline for an additional 7 days at discharge.  Hold Eliquis for now until cleared by ENT to resume JP in place with decrease output-plan to remove drain on 6/14 by ENT F/u with ID on 6/21 at 2pm.   2.DM Type2 Hold home dose medication RISS, fs lantus was d/c 'd due to low bg, now bg elevated, will resume at 3units daily  HA1c 8.9  3.Acute on CKD stage III-creatinine mildly elevated. Possibly prerenal as she has been having fevers Improved with hydration, cr today is 1.03 Continue gentle hydration. Renal function  4. PAF- on beta blk and eliquis Will hole a/c for I/D today, resume when cleared by ENT Continue with coreg   5.HTN- bp meds held initially due to low bp now improved,  continue Coreg 12.5 mg p.o. twice daily increased to 25 mg home dose as tolerated    6.Hyperkalemia-mild. Likely from #3. Start IVF was give kayexalate15gm x1 on 6/9 K now 4.7 Continue to monitor  6.  Hypercholesterolemia-continue home statin    DVT prophylaxis: Eiquis.Marland Kitchen> now SCD Code Status: full  Family Communication: by bedside Disposition Plan: back home when  stable  Barrier: Patient currently not medically stable for discharge as she requires IV antibiotics, febrile, and needs drain to be discontinued on monday Anticipated discharge date likely in 2 days      LOS: 5 days   Time spent: 45 min with >50% on coc    Nolberto Hanlon, MD [facility] Pager [phone]  If 7PM-7AM, please contact night-coverage [email or web address] Password [facility] 11/24/2019, 4:05 PM

## 2019-11-24 NOTE — Plan of Care (Signed)

## 2019-11-24 NOTE — Plan of Care (Signed)

## 2019-11-24 NOTE — Progress Notes (Signed)
   ENT Progress Note: POD #3 s/p Procedure(s): INCISION AND DRAINAGE FOREHEAD & RIGHT EAR ABSCESS   Subjective: Patient reports decreased pain and headache  Objective: Vital signs in last 24 hours: Temp:  [98 F (36.7 C)-99.7 F (37.6 C)] 98.2 F (36.8 C) (06/14 0801) Pulse Rate:  [73-95] 95 (06/14 0801) Resp:  [14-17] 17 (06/14 0801) BP: (142-188)/(77-88) 159/84 (06/14 0801) SpO2:  [91 %-97 %] 97 % (06/14 0801) Weight change:  Last BM Date: 11/22/19  Intake/Output from previous day: 06/13 0701 - 06/14 0700 In: 720 [P.O.:720] Out: -  Intake/Output this shift: No intake/output data recorded.  Labs: Recent Labs    11/22/19 0447 11/24/19 0142  WBC 19.1* 12.4*  HGB 11.2* 11.2*  HCT 36.5 36.5  PLT 314 330   Recent Labs    11/24/19 0142  NA 138  K 5.3*  CL 105  CO2 26  GLUCOSE 211*  BUN 22  CALCIUM 8.4*    Studies/Results: No results found.   PHYSICAL EXAM: Minimal discharge from both incisions, drains removed without difficulty.  Erythema improving with some continued swelling in the forehead.   Assessment/Plan: Significant clinical improvement with current medical therapy and drainage of abscesses.  White blood cell count is falling.  Minimal discharge, drains removed.  Plan discharge per medical service.  Recommend oral antibiotics per infectious disease, would also add Bactroban ointment applied to the incisions on a twice daily basis.  Continue dressing changes as needed.  Monitor for additional symptoms and follow-up with me as an outpatient as needed.  Patient scheduled to follow-up with infectious disease in approximately 2 weeks.  Please reconsult as needed.    Jerrell Belfast 11/24/2019, 8:16 AM

## 2019-11-25 LAB — CBC
HCT: 38.6 % (ref 36.0–46.0)
Hemoglobin: 12 g/dL (ref 12.0–15.0)
MCH: 30 pg (ref 26.0–34.0)
MCHC: 31.1 g/dL (ref 30.0–36.0)
MCV: 96.5 fL (ref 80.0–100.0)
Platelets: 326 10*3/uL (ref 150–400)
RBC: 4 MIL/uL (ref 3.87–5.11)
RDW: 15.9 % — ABNORMAL HIGH (ref 11.5–15.5)
WBC: 11.4 10*3/uL — ABNORMAL HIGH (ref 4.0–10.5)
nRBC: 0 % (ref 0.0–0.2)

## 2019-11-25 LAB — GLUCOSE, CAPILLARY
Glucose-Capillary: 120 mg/dL — ABNORMAL HIGH (ref 70–99)
Glucose-Capillary: 144 mg/dL — ABNORMAL HIGH (ref 70–99)
Glucose-Capillary: 167 mg/dL — ABNORMAL HIGH (ref 70–99)
Glucose-Capillary: 207 mg/dL — ABNORMAL HIGH (ref 70–99)
Glucose-Capillary: 78 mg/dL (ref 70–99)

## 2019-11-25 LAB — BASIC METABOLIC PANEL
Anion gap: 11 (ref 5–15)
BUN: 15 mg/dL (ref 8–23)
CO2: 27 mmol/L (ref 22–32)
Calcium: 8.7 mg/dL — ABNORMAL LOW (ref 8.9–10.3)
Chloride: 103 mmol/L (ref 98–111)
Creatinine, Ser: 1.27 mg/dL — ABNORMAL HIGH (ref 0.44–1.00)
GFR calc Af Amer: 47 mL/min — ABNORMAL LOW (ref >60)
GFR calc non Af Amer: 40 mL/min — ABNORMAL LOW (ref >60)
Glucose, Bld: 127 mg/dL — ABNORMAL HIGH (ref 70–99)
Potassium: 4 mmol/L (ref 3.5–5.1)
Sodium: 141 mmol/L (ref 135–145)

## 2019-11-25 MED ORDER — DOXYCYCLINE HYCLATE 100 MG PO TABS: 100.0000 mg | ORAL_TABLET | Freq: Two times a day (BID) | ORAL | Status: DC

## 2019-11-25 MED ORDER — VANCOMYCIN HCL 750 MG/150ML IV SOLN: 750.0000 mg | INTRAVENOUS | Status: DC

## 2019-11-25 MED ORDER — BACID PO TABS: 2.0000 | ORAL_TABLET | Freq: Three times a day (TID) | ORAL | 0 refills | Status: DC

## 2019-11-25 MED ORDER — BACID PO TABS
2.0000 | ORAL_TABLET | Freq: Three times a day (TID) | ORAL | Status: DC
  Filled 2019-11-25 (×3): qty 2

## 2019-11-25 MED ORDER — DOXYCYCLINE HYCLATE 100 MG PO TABS: 100.0000 mg | ORAL_TABLET | Freq: Two times a day (BID) | ORAL | 0 refills | Status: AC

## 2019-11-25 MED ORDER — MUPIROCIN CALCIUM 2 % EX CREA: TOPICAL_CREAM | Freq: Two times a day (BID) | CUTANEOUS | 0 refills | Status: DC

## 2019-11-25 MED ORDER — DOXYCYCLINE HYCLATE 100 MG PO TABS: 100.0000 mg | ORAL_TABLET | Freq: Two times a day (BID) | ORAL | 0 refills | Status: DC

## 2019-11-25 MED ORDER — BACID PO TABS: 2.0000 | ORAL_TABLET | Freq: Three times a day (TID) | ORAL | 0 refills | Status: AC

## 2019-11-25 MED FILL — MUPIROCIN 2% OINTMENT: 2 | 7 days supply | Qty: 15 | Fill #0

## 2019-11-25 MED FILL — DOXYCYCLINE HYCLATE 100 MG: 100 | 7 days supply | Qty: 14 | Fill #0

## 2019-11-25 MED FILL — FLORASTOR 250 MG CAPSULE: 250 | 10 days supply | Qty: 20 | Fill #0

## 2019-11-25 NOTE — Discharge Summary (Signed)
RILY NICKEY PPI:[id number] DOB: 09-14-1940 DOA: 11/19/2019  PCP: [name], MD  Admit date: 11/19/2019 Discharge date: 11/25/2019  Admitted From: Home Disposition: Home  Recommendations for Outpatient Follow-up:  1. Follow up with PCP in 1 week 2. Please obtain BMP/CBC in one week 3. Infectious disease Dr. De Burrs on 6/21 at 2 PM 4. Follow-up with Dr. Wilburn Cornelia ENT in 1 week     Discharge Condition:Stable CODE STATUS: Full Diet recommendation: Carb controlled low potassium diet Brief/Interim Summary: JENNIE HANNAY is a 79 y.o. female with medical history significant of chronic kidney disease stage III, diabetes mellitus type 2, paroxysmal atrial fibrillation, hypercholesterolemia, hypertension was sent by her primary care today for treatment of facial cellulitis and abscess.  About a week ago patient decided to use a razor to cut her hair that was bothering her and it was falling on her forehead.  By Friday patient noticed some erythema of her face.  Progressively erythema and warmth worsened and today could not open her right eye.  Noticed a large bump on her mid forehead.  She called her PCP for follow-up today.  Was seen at PCP office and was found to be febrile and was sent to ER for further evaluation.  1.facial cellulitis with abscess-secondary to using razor to cut her hair wbc decreased Clinically improved. No further facial swelling or erythema  status post I/D in ER, wound cx staph.A,  ENT input was appreciated. CT max completed, see result. S/p I/D by ENT on 6/11-status post drain removed no further bleeding this a.m.  ID following -culture from the ED now with MRSA.  Was on vancomycin and per ID Can go home on doxycycline for an additional 7 days at discharge.  F/u with ID on 6/21 at 2pm. Bactroban ointment applied to the incisions on a twice daily basis Continue dressing changes as needed.   2.DM Type2 Resume home medications on discharge RISS, fs  HA1c 8.9-needs  mildly more improvement of her diabetes can follow-up with her PCP as outpatient  3.Acute on CKD stage III-creatinine mildly elevated. Possibly prerenal      4. PAF- on beta blk and eliquis Per ENT via chat okay to resume Eliquis.  No further bleeding on dressing or site    5.HTN-discharged on home meds  6.Hyperkalemia-mild.  Resolved with Kayexalate Per patient she has this problem as outpatient Recommended low potassium diet Will need to follow-up with PCP for monitoring as outpatient   6.Hypercholesterolemia-continue home statin   Discharge Diagnoses:  Principal Problem:   Staph aureus infection Active Problems:   Cellulitis    Discharge Instructions  Discharge Instructions    Call MD for:  redness, tenderness, or signs of infection (pain, swelling, redness, odor or green/yellow discharge around incision site)   Complete by: As directed    Call MD for:  temperature >100.4   Complete by: As directed    Change dressing (specify)   Complete by: As directed    Dressing change: once daily times  using gauzes provided to you .   Diet - low sodium heart healthy   Complete by: As directed    Discharge instructions   Complete by: As directed    Follow a low potassium diet Do not miss any of your antibiotics Follow-up with infectious disease Dr. Britta Mccreedy 6/21 at 2 PM Follow-up with Dr. Avon Gully ENT in 1 week   Increase activity slowly   Complete by: As directed      Allergies as of  11/25/2019      Reactions   Altace [ramipril] Anaphylaxis   Cortisone Itching, Rash   Diovan [valsartan] Other (See Comments)   Hyperkalemia   Niacin Diarrhea   And nausea   Niacin And Related Diarrhea      Medication List    TAKE these medications   amLODipine 10 MG tablet Commonly known as: NORVASC Take 10 mg by mouth daily.   apixaban 5 MG Tabs tablet Commonly known as: ELIQUIS Take 1 tablet (5 mg total) by mouth 2 (two) times daily.   atorvastatin 10  MG tablet Commonly known as: LIPITOR Take 10 mg by mouth daily.   Avastin 400 MG/16ML Soln Generic drug: bevacizumab Inject 5 mg/kg into the vein once.   bacitracin-polymyxin b ophthalmic ointment Commonly known as: POLYSPORIN Place 1 application into the left eye daily as needed (For eye infection).   CALCIUM-MAGNESIUM-ZINC PO Take 1 tablet by mouth in the morning and at bedtime.   calcium-vitamin D 500-200 MG-UNIT Tabs tablet Commonly known as: OSCAL WITH D Take 1 tablet by mouth daily.   carvedilol 25 MG tablet Commonly known as: COREG Take 25 mg by mouth 2 (two) times daily with a meal.   cholecalciferol 1000 units tablet Commonly known as: VITAMIN D Take 1,000 Units by mouth daily.   colchicine 0.6 MG tablet Take 0.6 mg by mouth daily as needed (gout). Started 04/07/13   [employer] B-12 500 MCG tablet Generic drug: vitamin B-12 Take 1,000 mcg by mouth daily.   doxycycline 100 MG tablet Commonly known as: VIBRA-TABS Take 1 tablet (100 mg total) by mouth every 12 (twelve) hours for 7 days. Start taking on: November 26, 2019   DULoxetine 60 MG capsule Commonly known as: CYMBALTA Take by mouth daily.   Fifty50 Pen Needles 32G X 4 MM Misc Generic drug: Insulin Pen Needle USE TO INJECT INSULIN ONCE A DAY   furosemide 20 MG tablet Commonly known as: LASIX Take 20 mg by mouth daily.   gabapentin 100 MG capsule Commonly known as: NEURONTIN Take 100 mg by mouth at bedtime.   HYDROcodone-acetaminophen 5-325 MG tablet Commonly known as: NORCO/VICODIN Take 1 tablet by mouth every 6 (six) hours as needed for moderate pain.   Iron 325 (65 Fe) MG Tabs Take 1 tablet by mouth 3 (three) times daily.   ketoconazole 2 % shampoo Commonly known as: NIZORAL Apply 1 application topically 2 (two) times a week.   lactobacillus acidophilus Tabs tablet Take 2 tablets by mouth 3 (three) times daily for 10 days.   Lancets 30G Misc Use to test blood sugars 3 times a day (Dx: E11.65)    latanoprost 0.005 % ophthalmic solution Commonly known as: XALATAN Place 1 drop into the left eye at bedtime.   Magnesium 500 MG Caps Take 1 capsule by mouth daily.   mupirocin cream 2 % Commonly known as: BACTROBAN Apply topically 2 (two) times daily.   mupirocin ointment 2 % Commonly known as: BACTROBAN Place 1 application into the nose daily as needed (For rash).   nateglinide 120 MG tablet Commonly known as: STARLIX Take 120 mg by mouth daily as needed (If sugar is below 130).   OneTouch Verio test strip Generic drug: glucose blood USE AS DIRECTED. TESTING FREQUENCY  3 X/DAILY (DX E11.65)   pantoprazole 40 MG tablet Commonly known as: PROTONIX Take 40 mg by mouth 2 (two) times daily.   Toujeo Max SoloStar 300 UNIT/ML Solostar Pen Generic drug: insulin glargine (2 Unit Dial) Inject  14-15 Units into the skin daily. Inject 14 units when sugar is 157 if higher inject 15 units per patient   triamcinolone cream 0.1 % Commonly known as: KENALOG Apply 1 application topically 2 (two) times daily as needed (For rash).   Trulicity 1.5 ME/2.6ST Sopn Generic drug: Dulaglutide Inject 1.5 mg into the skin every 7 (seven) days.            Discharge Care Instructions  (From admission, onward)         Start     Ordered   11/25/19 0000  Change dressing (specify)       Comments: Dressing change: once daily times  using gauzes provided to you .   11/25/19 1202          Follow-up Information    Jerrell Belfast, MD In 2 weeks.   Specialty: Otolaryngology Contact information: [address] [place] [address] [phone]        [name], MD Follow up in 1 week(s).   Specialty: Family Medicine Contact information: [place] [address] [phone]        Thayer Headings, MD. Go on 12/01/2019.   Specialty: Infectious Diseases Why: on 6/21 at 2pm Contact information: [address] [place] [address] [phone]              Allergies  Allergen Reactions  . Altace [Ramipril] Anaphylaxis  . Cortisone Itching and Rash  . Diovan [Valsartan] Other (See Comments)    Hyperkalemia   . Niacin Diarrhea    And nausea  . Niacin And Related Diarrhea    Consultations:  ENT and ID   Procedures/Studies: CT MAXILLOFACIAL W CONTRAST  Result Date: 11/20/2019 CLINICAL DATA:  Facial abscess EXAM: CT MAXILLOFACIAL WITH CONTRAST TECHNIQUE: Multidetector CT imaging of the maxillofacial structures was performed with intravenous contrast. Multiplanar CT image reconstructions were also generated. CONTRAST:  24mL OMNIPAQUE IOHEXOL 300 MG/ML  SOLN COMPARISON:  None. FINDINGS: Osseous: No fracture or mandibular dislocation. No destructive process. Orbits: Negative. No traumatic or inflammatory finding. Right scleral band. Sinuses: Clear. Soft tissues: There is induration and swelling of the skin and subcutaneous tissues of the right frontal scalp. There is no abscess or fluid collection. There is also mild subcutaneous induration and skin thickening in the left submandibular region. Limited intracranial: No significant or unexpected finding. IMPRESSION: Right frontal scalp induration and swelling without abscess or fluid collection. Electronically Signed   By: Ulyses Jarred M.D.   On: 11/20/2019 19:40       Subjective: Has some soreness of forehead.  No fever or chills.  Swelling and erythema of the face resolved  Discharge Exam: Vitals:   11/25/19 0428 11/25/19 0737  BP: 132/62 (!) 164/94  Pulse: 82 84  Resp: 16 16  Temp: 98.7 F (37.1 C) 98.1 F (36.7 C)  SpO2: 97% 97%   Vitals:   11/24/19 1300 11/24/19 1920 11/25/19 0428 11/25/19 0737  BP: (!) 148/77 139/65 132/62 (!) 164/94  Pulse: 92 61 82 84  Resp: 16 16 16 16   Temp: 98.3 F (36.8 C) 99 F (37.2 C) 98.7 F (37.1 C) 98.1 F (36.7 C)  TempSrc: Oral Oral Oral Oral  SpO2: 95% 99% 97% 97%  Weight:       Height:        General: Pt is alert, awake, not in acute distress HEENT: mild drainage from forehead. Behind right ear dry and clean Cardiovascular:  RRR, S1/S2 +, no rubs, no gallops Respiratory: CTA bilaterally, no wheezing, no rhonchi Abdominal: Soft, NT, ND, bowel sounds + Extremities: no edema, no cyanosis    The results of significant diagnostics from this hospitalization (including imaging, microbiology, ancillary and laboratory) are listed below for reference.     Microbiology: Recent Results (from the past 240 hour(s))  Culture, blood (routine x 2)     Status: None   Collection Time: 11/19/19  4:02 PM   Specimen: BLOOD  Result Value Ref Range Status   Specimen Description BLOOD RIGHT ANTECUBITAL  Final   Special Requests   Final    BOTTLES DRAWN AEROBIC AND ANAEROBIC Blood Culture adequate volume   Culture   Final    NO GROWTH 5 DAYS Performed at [facility], [address], [place], [place] [address]    Report Status 11/24/2019 FINAL  Final  Culture, blood (routine x 2)     Status: None   Collection Time: 11/19/19  4:45 PM   Specimen: BLOOD  Result Value Ref Range Status   Specimen Description BLOOD SITE NOT SPECIFIED  Final   Special Requests   Final    BOTTLES DRAWN AEROBIC AND ANAEROBIC Blood Culture results may not be optimal due to an inadequate volume of blood received in culture bottles   Culture   Final    NO GROWTH 5 DAYS Performed at [facility], [place] [address], [place], [place] [address]    Report Status 11/24/2019 FINAL  Final  SARS Coronavirus 2 by RT PCR (hospital order, performed in [facility] hospital lab) Nasopharyngeal Urine, Clean Catch     Status: None   Collection Time: 11/19/19  4:45 PM   Specimen: Urine, Clean Catch; Nasopharyngeal  Result Value Ref Range Status   SARS Coronavirus 2 NEGATIVE NEGATIVE Final    Comment: (NOTE) SARS-CoV-2 target nucleic acids are NOT DETECTED. The SARS-CoV-2 RNA is generally detectable in  upper and lower respiratory specimens during the acute phase of infection. The lowest concentration of SARS-CoV-2 viral copies this assay can detect is 250 copies / mL. A negative result does not preclude SARS-CoV-2 infection and should not be used as the sole basis for treatment or other patient management decisions.  A negative result may occur with improper specimen collection / handling, submission of specimen other than nasopharyngeal swab, presence of viral mutation(s) within the areas targeted by this assay, and inadequate number of viral copies (<250 copies / mL). A negative result must be combined with clinical observations, patient history, and epidemiological information. Fact Sheet for Patients:   StrictlyIdeas.no Fact Sheet for Healthcare Providers: BankingDealers.co.za This test is not yet approved or cleared  by the [place] FDA and has been authorized for detection and/or diagnosis of SARS-CoV-2 by FDA under an Emergency Use Authorization (EUA).  This EUA will remain in effect (meaning this test can be used) for the duration of the COVID-19 declaration under Section 564(b)(1) of the Act, 21 U.S.C. section 360bbb-3(b)(1), unless the authorization is terminated or revoked sooner. Performed at [facility], [place] [address] [place], [place] [address]   Wound or Superficial Culture     Status: None   Collection Time: 11/19/19  5:40 PM   Specimen: Wound  Result Value Ref Range Status   Specimen Description WOUND  Final   Special Requests FOREHEAD  Final   Gram Stain   Final    RARE WBC PRESENT, PREDOMINANTLY PMN FEW GRAM POSITIVE COCCI IN  PAIRS IN CLUSTERS Performed at [facility], [place] [address] [place], [place] [address]    Culture FEW METHICILLIN RESISTANT STAPHYLOCOCCUS AUREUS  Final   Report Status 11/22/2019 FINAL  Final   Organism ID, Bacteria METHICILLIN RESISTANT STAPHYLOCOCCUS AUREUS  Final       Susceptibility   Methicillin resistant staphylococcus aureus - MIC*    CIPROFLOXACIN <=0.5 SENSITIVE Sensitive     ERYTHROMYCIN >=8 RESISTANT Resistant     GENTAMICIN <=0.5 SENSITIVE Sensitive     OXACILLIN >=4 RESISTANT Resistant     TETRACYCLINE <=1 SENSITIVE Sensitive     VANCOMYCIN <=0.5 SENSITIVE Sensitive     TRIMETH/SULFA 80 RESISTANT Resistant     CLINDAMYCIN <=0.25 SENSITIVE Sensitive     RIFAMPIN <=0.5 SENSITIVE Sensitive     Inducible Clindamycin NEGATIVE Sensitive     * FEW METHICILLIN RESISTANT STAPHYLOCOCCUS AUREUS  Surgical pcr screen     Status: None   Collection Time: 11/20/19  5:41 PM   Specimen: Nasal Mucosa; Nasal Swab  Result Value Ref Range Status   MRSA, PCR NEGATIVE NEGATIVE Final   Staphylococcus aureus NEGATIVE NEGATIVE Final    Comment: (NOTE) The Xpert SA Assay (FDA approved for NASAL specimens in patients 18 years of age and older), is one component of a comprehensive surveillance program. It is not intended to diagnose infection nor to guide or monitor treatment. Performed at [facility], [place] [address], [place], [place] [address]      Labs: BNP (last 3 results) No results for input(s): BNP in the last 8760 hours. Basic Metabolic Panel: Recent Labs  Lab 11/19/19 1326 11/20/19 0339 11/21/19 0409 11/24/19 0142 11/25/19 0204  NA 136 141 139 138 141  K 5.2* 5.1 4.7 5.3* 4.0  CL 102 103 105 105 103  CO2 23 27 25 26 27   GLUCOSE 216* 46* 65* 211* 127*  BUN 30* 23 15 22 15   CREATININE 1.61* 1.23* 1.03* 1.00 1.27*  CALCIUM 8.9 8.4* 8.4* 8.4* 8.7*   Liver Function Tests: Recent Labs  Lab 11/19/19 1326  AST 15  ALT 14  ALKPHOS 66  BILITOT 0.5  PROT 6.6  ALBUMIN 2.9*   No results for input(s): LIPASE, AMYLASE in the last 168 hours. No results for input(s): AMMONIA in the last 168 hours. CBC: Recent Labs  Lab 11/19/19 1326 11/19/19 1326 11/20/19 0339 11/21/19 0409 11/22/19 0447 11/24/19 0142 11/25/19 0204  WBC 24.2*   <  > 24.0* 20.1* 19.1* 12.4* 11.4*  NEUTROABS 20.7*  --   --   --   --   --   --   HGB 12.1   < > 11.6* 11.9* 11.2* 11.2* 12.0  HCT 39.2   < > 38.1 39.4 36.5 36.5 38.6  MCV 96.8   < > 97.9 98.0 97.1 97.6 96.5  PLT 317   < > 283 311 314 330 326   < > = values in this interval not displayed.   Cardiac Enzymes: No results for input(s): CKTOTAL, CKMB, CKMBINDEX, TROPONINI in the last 168 hours. BNP: Invalid input(s): POCBNP CBG: Recent Labs  Lab 11/25/19 0018 11/25/19 0224 11/25/19 0427 11/25/19 0735 11/25/19 1144  GLUCAP 78 120* 144* 167* 207*   D-Dimer No results for input(s): DDIMER in the last 72 hours. Hgb A1c No results for input(s): HGBA1C in the last 72 hours. Lipid Profile No results for input(s): CHOL, HDL, LDLCALC, TRIG, CHOLHDL, LDLDIRECT in the last 72 hours. Thyroid function studies No results for input(s):  TSH, T4TOTAL, T3FREE, THYROIDAB in the last 72 hours.  Invalid input(s): FREET3 Anemia work up No results for input(s): VITAMINB12, FOLATE, FERRITIN, TIBC, IRON, RETICCTPCT in the last 72 hours. Urinalysis    Component Value Date/Time   COLORURINE YELLOW 11/19/2019 [place] 11/19/2019 1645   LABSPEC 1.009 11/19/2019 1645   PHURINE 5.0 11/19/2019 1645   GLUCOSEU NEGATIVE 11/19/2019 1645   HGBUR NEGATIVE 11/19/2019 1645   BILIRUBINUR NEGATIVE 11/19/2019 1645   KETONESUR NEGATIVE 11/19/2019 1645   PROTEINUR NEGATIVE 11/19/2019 1645   UROBILINOGEN 0.2 04/17/2012 0835   NITRITE NEGATIVE 11/19/2019 1645   LEUKOCYTESUR LARGE (A) 11/19/2019 1645   Sepsis Labs Invalid input(s): PROCALCITONIN,  WBC,  LACTICIDVEN Microbiology Recent Results (from the past 240 hour(s))  Culture, blood (routine x 2)     Status: None   Collection Time: 11/19/19  4:02 PM   Specimen: BLOOD  Result Value Ref Range Status   Specimen Description BLOOD RIGHT ANTECUBITAL  Final   Special Requests   Final    BOTTLES DRAWN AEROBIC AND ANAEROBIC Blood Culture adequate  volume   Culture   Final    NO GROWTH 5 DAYS Performed at [facility], [address], [place], [place] [address]    Report Status 11/24/2019 FINAL  Final  Culture, blood (routine x 2)     Status: None   Collection Time: 11/19/19  4:45 PM   Specimen: BLOOD  Result Value Ref Range Status   Specimen Description BLOOD SITE NOT SPECIFIED  Final   Special Requests   Final    BOTTLES DRAWN AEROBIC AND ANAEROBIC Blood Culture results may not be optimal due to an inadequate volume of blood received in culture bottles   Culture   Final    NO GROWTH 5 DAYS Performed at [facility], [place] [address] [place], [place] [address]    Report Status 11/24/2019 FINAL  Final  SARS Coronavirus 2 by RT PCR (hospital order, performed in [facility] hospital lab) Nasopharyngeal Urine, Clean Catch     Status: None   Collection Time: 11/19/19  4:45 PM   Specimen: Urine, Clean Catch; Nasopharyngeal  Result Value Ref Range Status   SARS Coronavirus 2 NEGATIVE NEGATIVE Final    Comment: (NOTE) SARS-CoV-2 target nucleic acids are NOT DETECTED. The SARS-CoV-2 RNA is generally detectable in upper and lower respiratory specimens during the acute phase of infection. The lowest concentration of SARS-CoV-2 viral copies this assay can detect is 250 copies / mL. A negative result does not preclude SARS-CoV-2 infection and should not be used as the sole basis for treatment or other patient management decisions.  A negative result may occur with improper specimen collection / handling, submission of specimen other than nasopharyngeal swab, presence of viral mutation(s) within the areas targeted by this assay, and inadequate number of viral copies (<250 copies / mL). A negative result must be combined with clinical observations, patient history, and epidemiological information. Fact Sheet for Patients:   StrictlyIdeas.no Fact Sheet for Healthcare  Providers: BankingDealers.co.za This test is not yet approved or cleared  by the [place] FDA and has been authorized for detection and/or diagnosis of SARS-CoV-2 by FDA under an Emergency Use Authorization (EUA).  This EUA will remain in effect (meaning this test can be used) for the duration of the COVID-19 declaration under Section 564(b)(1) of the Act, 21 U.S.C. section 360bbb-3(b)(1), unless the authorization is terminated or revoked sooner. Performed at [facility],  [address] [place], [place] [address]   Wound or Superficial Culture     Status: None   Collection Time: 11/19/19  5:40 PM   Specimen: Wound  Result Value Ref Range Status   Specimen Description WOUND  Final   Special Requests FOREHEAD  Final   Gram Stain   Final    RARE WBC PRESENT, PREDOMINANTLY PMN FEW GRAM POSITIVE COCCI IN PAIRS IN CLUSTERS Performed at [facility], [address], [place], [place] [address]    Culture FEW METHICILLIN RESISTANT STAPHYLOCOCCUS AUREUS  Final   Report Status 11/22/2019 FINAL  Final   Organism ID, Bacteria METHICILLIN RESISTANT STAPHYLOCOCCUS AUREUS  Final      Susceptibility   Methicillin resistant staphylococcus aureus - MIC*    CIPROFLOXACIN <=0.5 SENSITIVE Sensitive     ERYTHROMYCIN >=8 RESISTANT Resistant     GENTAMICIN <=0.5 SENSITIVE Sensitive     OXACILLIN >=4 RESISTANT Resistant     TETRACYCLINE <=1 SENSITIVE Sensitive     VANCOMYCIN <=0.5 SENSITIVE Sensitive     TRIMETH/SULFA 80 RESISTANT Resistant     CLINDAMYCIN <=0.25 SENSITIVE Sensitive     RIFAMPIN <=0.5 SENSITIVE Sensitive     Inducible Clindamycin NEGATIVE Sensitive     * FEW METHICILLIN RESISTANT STAPHYLOCOCCUS AUREUS  Surgical pcr screen     Status: None   Collection Time: 11/20/19  5:41 PM   Specimen: Nasal Mucosa; Nasal Swab  Result Value Ref Range Status   MRSA, PCR NEGATIVE NEGATIVE Final   Staphylococcus aureus NEGATIVE NEGATIVE Final    Comment:  (NOTE) The Xpert SA Assay (FDA approved for NASAL specimens in patients 17 years of age and older), is one component of a comprehensive surveillance program. It is not intended to diagnose infection nor to guide or monitor treatment. Performed at [facility], [place] [address] [place], [place] [address]      Time coordinating discharge: Over 30 minutes  SIGNED:   Nolberto Hanlon, MD  [facility] 11/25/2019, 12:02 PM Pager   If 7PM-7AM, please contact night-coverage [email or web address] Password [email or web address]

## 2019-11-25 NOTE — Progress Notes (Signed)
Provided discharge education/instructions, all questions and concerns addressed. Dressing supply handed to Pt, RN demonstrated how to change dressing to R ear and forehead, Pt states understanding, Pt not in distress, to discharge home accompanied by friend/pastor.

## 2019-11-25 NOTE — Plan of Care (Signed)
  Problem: Clinical Measurements: Goal: Ability to maintain clinical measurements within normal limits will improve Outcome: Progressing   Problem: Activity: Goal: Risk for activity intolerance will decrease Outcome: Progressing   Problem: Nutrition: Goal: Adequate nutrition will be maintained Outcome: Progressing   Problem: Elimination: Goal: Will not experience complications related to bowel motility Outcome: Progressing   Problem: Pain Managment: Goal: General experience of comfort will improve Outcome: Progressing   Problem: Safety: Goal: Ability to remain free from injury will improve Outcome: Progressing   

## 2019-11-25 NOTE — Progress Notes (Signed)
Pharmacy Antibiotic Note  Sabrina Baker is a 79 y.o. female admitted on 11/19/2019 with cellulitis.  Pharmacy has been consulted for vancomycin dosing.  Afebrile, WBC down trending. Scr up today. UOP not documented. Dose already hung today- asked RN to stop after 1 hour (estimated received ~ 800mg ). Will decrease dose in case patient does not discharge today.   Plan: Decrease Vancomycin to 750 mg IV every 24 hours   Discharge on doxycycline 100 bid x7d  Monitor clinical status, renal function, level if indicated   Temp (24hrs), Avg:98.5 F (36.9 C), Min:98.1 F (36.7 C), Max:99 F (37.2 C)  Recent Labs  Lab 11/19/19 1326 11/19/19 1326 11/19/19 1602 11/20/19 0339 11/21/19 0409 11/22/19 0447 11/24/19 0142 11/25/19 0204  WBC 24.2*   < >  --  24.0* 20.1* 19.1* 12.4* 11.4*  CREATININE 1.61*  --   --  1.23* 1.03*  --  1.00 1.27*  LATICACIDVEN 1.0  --  1.1  --   --   --   --   --    < > = values in this interval not displayed.    Estimated Creatinine Clearance: 35.7 mL/min (A) (by C-G formula based on SCr of 1.27 mg/dL (H)).    Allergies  Allergen Reactions  . Altace [Ramipril] Anaphylaxis  . Cortisone Itching and Rash  . Diovan [Valsartan] Other (See Comments)    Hyperkalemia   . Niacin Diarrhea    And nausea  . Niacin And Related Diarrhea    Antimicrobials this admission: Vancomycin 6/9 >>  Ceftriaxone 6/9 x1   Dose adjustments this admission: 6/11: Increase vancomycin from 750 mg IV q24h to 1250 mg IV q24h 6/15: Decr vanc to 750mg  Q24h   Microbiology results: 6/9 Bcx: ngtd 6/9 Wound cx: few MRSA  6/10 MRSA PCR neg    Thank you for allowing pharmacy to be a part of this patient's care.    Benetta Spar, PharmD, BCPS, BCCP Clinical Pharmacist  Please check AMION for all House phone numbers After 10:00 PM, call [place] [phone]

## 2019-11-26 ENCOUNTER — Encounter (INDEPENDENT_AMBULATORY_CARE_PROVIDER_SITE_OTHER): Payer: Medicare Other | Admitting: Ophthalmology

## 2019-12-01 ENCOUNTER — Other Ambulatory Visit: Payer: Self-pay

## 2019-12-01 ENCOUNTER — Encounter: Payer: Self-pay | Admitting: Internal Medicine

## 2019-12-01 ENCOUNTER — Ambulatory Visit (INDEPENDENT_AMBULATORY_CARE_PROVIDER_SITE_OTHER): Payer: Medicare Other | Admitting: Internal Medicine

## 2019-12-01 VITALS — BP 146/80 | HR 80 | Temp 98.7°F | Wt 164.0 lb

## 2019-12-01 DIAGNOSIS — L03211 Cellulitis of face: Secondary | ICD-10-CM | POA: Diagnosis not present

## 2019-12-01 DIAGNOSIS — A4901 Methicillin susceptible Staphylococcus aureus infection, unspecified site: Secondary | ICD-10-CM

## 2019-12-01 NOTE — Assessment & Plan Note (Signed)
Clinically resolved.  No further antibiotics indicated.  She knows to call if there are any new concerns.   rtc as needed

## 2019-12-01 NOTE — Progress Notes (Signed)
   Subjective:    Patient ID: Sabrina Baker, female    DOB: 02/02/1941, 79 y.o.   MRN: [id number]  HPI Here for follow up of facial cellulitis I saw her in the hospital and had cellulitis on her forehead and extension down to ear and along the right side of her face.  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  She underwent debridement by Dr. Wilburn Cornelia.  She is doing well, no new complaints.  No fever or chills.  She completed 7 days of doxycycline post discharge this am.     Review of Systems  Constitutional: Negative for chills and fever.  Gastrointestinal: Negative for diarrhea.  Skin: Negative for rash.       Objective:   Physical Exam Constitutional:      Appearance: Normal appearance.  Eyes:     General: No scleral icterus. Skin:    Comments: Facial cellulitis area resolved.  Some mild redness at the incision site but not warm, no tender.    Neurological:     Mental Status: She is alert.  Psychiatric:        Mood and Affect: Mood normal.           Assessment & Plan:

## 2019-12-01 NOTE — Assessment & Plan Note (Signed)
As above, treated with appropriate drainage and antibiotics. resolved

## 2019-12-03 ENCOUNTER — Other Ambulatory Visit: Payer: Self-pay

## 2019-12-03 ENCOUNTER — Encounter (INDEPENDENT_AMBULATORY_CARE_PROVIDER_SITE_OTHER): Payer: Medicare Other | Admitting: Ophthalmology

## 2019-12-03 DIAGNOSIS — I10 Essential (primary) hypertension: Secondary | ICD-10-CM

## 2019-12-03 DIAGNOSIS — E113511 Type 2 diabetes mellitus with proliferative diabetic retinopathy with macular edema, right eye: Secondary | ICD-10-CM | POA: Diagnosis not present

## 2019-12-03 DIAGNOSIS — H338 Other retinal detachments: Secondary | ICD-10-CM

## 2019-12-03 DIAGNOSIS — H35033 Hypertensive retinopathy, bilateral: Secondary | ICD-10-CM

## 2019-12-03 DIAGNOSIS — H33302 Unspecified retinal break, left eye: Secondary | ICD-10-CM

## 2019-12-03 DIAGNOSIS — E11311 Type 2 diabetes mellitus with unspecified diabetic retinopathy with macular edema: Secondary | ICD-10-CM | POA: Diagnosis not present

## 2019-12-03 DIAGNOSIS — E113312 Type 2 diabetes mellitus with moderate nonproliferative diabetic retinopathy with macular edema, left eye: Secondary | ICD-10-CM

## 2019-12-03 DIAGNOSIS — H43813 Vitreous degeneration, bilateral: Secondary | ICD-10-CM

## 2019-12-08 ENCOUNTER — Telehealth: Payer: Self-pay

## 2019-12-08 NOTE — Telephone Encounter (Signed)
Patient wanted to make provider aware that her WBC was elevated. Patient states she had labs drawn on Friday with PCP and will have them fax results to [facility] for Dr. Linus Salmons to review. Patient denies any fevers.  Sabrina Baker

## 2019-12-19 ENCOUNTER — Ambulatory Visit (INDEPENDENT_AMBULATORY_CARE_PROVIDER_SITE_OTHER): Payer: Medicare Other | Admitting: Podiatry

## 2019-12-19 ENCOUNTER — Other Ambulatory Visit: Payer: Self-pay

## 2019-12-19 DIAGNOSIS — E1142 Type 2 diabetes mellitus with diabetic polyneuropathy: Secondary | ICD-10-CM | POA: Diagnosis not present

## 2019-12-19 DIAGNOSIS — L539 Erythematous condition, unspecified: Secondary | ICD-10-CM

## 2019-12-19 DIAGNOSIS — L97511 Non-pressure chronic ulcer of other part of right foot limited to breakdown of skin: Secondary | ICD-10-CM | POA: Diagnosis not present

## 2019-12-23 ENCOUNTER — Encounter: Payer: Self-pay | Admitting: Podiatry

## 2019-12-23 NOTE — Progress Notes (Signed)
Subjective:  Patient ID: Sabrina Baker, female    DOB: Apr 01, 1941,  MRN: [id number]  Chief Complaint  Patient presents with  . Diabetes     ulcer and cellulitis/  . Nail Problem    thick painful toenails    79 y.o. female presents with the above complaint.  Patient presents with a complaint of right third and fourth digit superficial ulceration with some redness associated with it.  Patient states is going on for both toes.  Patient patient was seen by primary care physician who sent her in.  Primary care physician has given her doxycycline to start taking today.  She states it has progressive gotten worse over the last couple of weeks.  Patient is diabetic with last A1c of 8.9.  I instructed her to lower the blood sugar patient states understanding.  She just wants to make sure that she is not going to lose her digits.  She denies any other acute complaints.   Review of Systems: Negative except as noted in the HPI. Denies N/V/F/Ch.  Past Medical History:  Diagnosis Date  . Chronic kidney disease    stage 3  . Diabetes mellitus without complication ([place])   . Hypercholesteremia   . Hypertension   . Neuromuscular disorder (HCC)    neuropathy  . Osteopenia   . Retinal detachment   . TIA (transient ischemic attack)   . Vitamin B12 deficiency     Current Outpatient Medications:  .  amLODipine (NORVASC) 10 MG tablet, Take 10 mg by mouth daily., Disp: , Rfl:  .  apixaban (ELIQUIS) 5 MG TABS tablet, Take 1 tablet (5 mg total) by mouth 2 (two) times daily., Disp: 60 tablet, Rfl:  .  aspirin 325 MG EC tablet, HOLDING (on Eliquis), Disp: , Rfl:  .  atorvastatin (LIPITOR) 10 MG tablet, Take 10 mg by mouth daily., Disp: , Rfl:  .  bacitracin-polymyxin b (POLYSPORIN) ophthalmic ointment, Place 1 application into the left eye daily as needed (For eye infection). , Disp: , Rfl:  .  bevacizumab (AVASTIN) 400 MG/16ML SOLN, Inject 5 mg/kg into the vein once., Disp: , Rfl:  .   CALCIUM-MAGNESIUM-ZINC PO, Take 1 tablet by mouth in the morning and at bedtime. , Disp: , Rfl:  .  calcium-vitamin D (OSCAL WITH D) 500-200 MG-UNIT TABS tablet, Take 1 tablet by mouth daily. , Disp: , Rfl:  .  carvedilol (COREG) 25 MG tablet, Take 25 mg by mouth 2 (two) times daily with a meal., Disp: , Rfl:  .  cholecalciferol (VITAMIN D) 1000 UNITS tablet, Take 1,000 Units by mouth daily., Disp: , Rfl:  .  colchicine 0.6 MG tablet, Take 0.6 mg by mouth daily as needed (gout). Started 04/07/13, Disp: , Rfl:  .  [employer] B-12 500 MCG tablet, Take 1,000 mcg by mouth daily., Disp: , Rfl:  .  doxycycline (VIBRA-TABS) 100 MG tablet, Take 100 mg by mouth 2 (two) times daily., Disp: , Rfl:  .  DULoxetine (CYMBALTA) 60 MG capsule, Take by mouth daily., Disp: , Rfl:  .  Ferrous Sulfate (IRON) 325 (65 FE) MG TABS, Take 1 tablet by mouth 3 (three) times daily., Disp: , Rfl:  .  FLORASTOR 250 MG capsule, TAKE 1 CAPSULE BY MOUTH TWO TIMES DAILY FOR 10 DAYS., Disp: , Rfl:  .  furosemide (LASIX) 20 MG tablet, Take 20 mg by mouth daily., Disp: , Rfl:  .  gabapentin (NEURONTIN) 100 MG capsule, Take 100 mg by mouth at bedtime. ,  Disp: , Rfl: 3 .  HYDROcodone-acetaminophen (NORCO/VICODIN) 5-325 MG tablet, Take 1 tablet by mouth every 6 (six) hours as needed for moderate pain., Disp: 10 tablet, Rfl: 0 .  Insulin Glargine, 2 Unit Dial, (TOUJEO MAX SOLOSTAR) 300 UNIT/ML SOPN, Inject 14-15 Units into the skin daily. Inject 14 units when sugar is 157 if higher inject 15 units per patient, Disp: , Rfl:  .  Insulin Pen Needle (FIFTY50 PEN NEEDLES) 32G X 4 MM MISC, USE TO INJECT INSULIN ONCE A DAY, Disp: , Rfl:  .  ketoconazole (NIZORAL) 2 % shampoo, Apply 1 application topically 2 (two) times a week. , Disp: , Rfl:  .  Lancets 30G MISC, Use to test blood sugars 3 times a day (Dx: E11.65), Disp: , Rfl:  .  latanoprost (XALATAN) 0.005 % ophthalmic solution, Place 1 drop into the left eye at bedtime. , Disp: , Rfl: 4 .  Magnesium  500 MG CAPS, Take 1 capsule by mouth daily., Disp: , Rfl:  .  mupirocin cream (BACTROBAN) 2 %, Apply topically 2 (two) times daily. Apply to forehead wound until healed, Disp: 15 g, Rfl: 0 .  mupirocin ointment (BACTROBAN) 2 %, APPLY TOPICALLY TWO TIMES DAILY. APPLY TO FOREHEAD WOUND UNTIL HEALED, Disp: , Rfl:  .  nateglinide (STARLIX) 120 MG tablet, Take 120 mg by mouth daily as needed (If sugar is below 130). , Disp: , Rfl:  .  ONETOUCH VERIO test strip, USE AS DIRECTED. TESTING FREQUENCY  3 X/DAILY (DX E11.65), Disp: , Rfl:  .  pantoprazole (PROTONIX) 40 MG tablet, Take 40 mg by mouth 2 (two) times daily., Disp: , Rfl:  .  triamcinolone cream (KENALOG) 0.1 %, Apply 1 application topically 2 (two) times daily as needed (For rash). , Disp: , Rfl:  .  TRULICITY 1.5 NW/2.9FA SOPN, Inject 1.5 mg into the skin every 7 (seven) days. , Disp: , Rfl:   Social History   Tobacco Use  Smoking Status Never Smoker  Smokeless Tobacco Never Used    Allergies  Allergen Reactions  . Altace [Ramipril] Anaphylaxis  . Cortisone Itching and Rash  . Diovan [Valsartan] Other (See Comments)    Hyperkalemia   . Niacin Diarrhea    And nausea  . Niacin And Related Diarrhea   Objective:  There were no vitals filed for this visit. There is no height or weight on file to calculate BMI. Constitutional Well developed. Well nourished.  Vascular Dorsalis pedis pulses faintly palpable bilaterally. Posterior tibial pulses faintly palpable bilaterally. Capillary refill normal to all digits.  No cyanosis or clubbing noted. Pedal hair growth normal.  Neurologic Normal speech. Oriented to person, place, and time. Epicritic sensation to light touch grossly present bilaterally.  Dermatologic Nails right third fourth digit superficial ulceration limited to the breakdown of the skin.  Mild erythema redness noted up to the metatarsophalangeal joint of third and fourth digit.  No concern for deeper infection or abscess  formation.  No purulent drainage was expressed.  No blister formation noted.  Orthopedic: Normal joint ROM without pain or crepitus bilaterally. No visible deformities. No bony tenderness.   Radiographs: None Assessment:   1. Diabetic peripheral neuropathy associated with type 2 diabetes mellitus ([place])   2. Skin ulcer of fourth toe, limited to breakdown of skin, right ([place])   3. Erythema    Plan:  Patient was evaluated and treated and all questions answered.  Right superficial third and fourth digit skin ulcer limited to the breakdown of the  skin -I explained to the patient the etiology of skin ulcer and various treatment options were extensively discussed.  Given that there is redness associated with it as well I believe patient will benefit from antibiotics course which she was given to her by primary care physician.  I encouraged her to complete the entire course. -Patient will also benefit from Betadine wet-to-dry dressing changes to keep it nice and dry. -I discussed with the patient importance of glucose control given her last A1c is 8.9.  This is preventing her from healing up as quickly as it should due to high blood glucose level.  Patient states understanding. -Surgical shoe was dispensed to offload the forefoot.   No follow-ups on file.

## 2019-12-26 ENCOUNTER — Other Ambulatory Visit: Payer: Self-pay

## 2019-12-26 ENCOUNTER — Ambulatory Visit (INDEPENDENT_AMBULATORY_CARE_PROVIDER_SITE_OTHER): Payer: Medicare Other | Admitting: Podiatry

## 2019-12-26 ENCOUNTER — Encounter (INDEPENDENT_AMBULATORY_CARE_PROVIDER_SITE_OTHER): Payer: Medicare Other | Admitting: Ophthalmology

## 2019-12-26 DIAGNOSIS — B351 Tinea unguium: Secondary | ICD-10-CM

## 2019-12-26 DIAGNOSIS — E11311 Type 2 diabetes mellitus with unspecified diabetic retinopathy with macular edema: Secondary | ICD-10-CM | POA: Diagnosis not present

## 2019-12-26 DIAGNOSIS — M79674 Pain in right toe(s): Secondary | ICD-10-CM

## 2019-12-26 DIAGNOSIS — E113511 Type 2 diabetes mellitus with proliferative diabetic retinopathy with macular edema, right eye: Secondary | ICD-10-CM

## 2019-12-26 DIAGNOSIS — M79675 Pain in left toe(s): Secondary | ICD-10-CM | POA: Diagnosis not present

## 2019-12-26 DIAGNOSIS — I10 Essential (primary) hypertension: Secondary | ICD-10-CM

## 2019-12-26 DIAGNOSIS — H43813 Vitreous degeneration, bilateral: Secondary | ICD-10-CM

## 2019-12-26 DIAGNOSIS — E1142 Type 2 diabetes mellitus with diabetic polyneuropathy: Secondary | ICD-10-CM

## 2019-12-26 DIAGNOSIS — L97511 Non-pressure chronic ulcer of other part of right foot limited to breakdown of skin: Secondary | ICD-10-CM

## 2019-12-26 DIAGNOSIS — H35372 Puckering of macula, left eye: Secondary | ICD-10-CM

## 2019-12-26 DIAGNOSIS — H338 Other retinal detachments: Secondary | ICD-10-CM

## 2019-12-26 DIAGNOSIS — H35033 Hypertensive retinopathy, bilateral: Secondary | ICD-10-CM

## 2019-12-26 DIAGNOSIS — E113312 Type 2 diabetes mellitus with moderate nonproliferative diabetic retinopathy with macular edema, left eye: Secondary | ICD-10-CM

## 2019-12-30 ENCOUNTER — Encounter: Payer: Self-pay | Admitting: Podiatry

## 2019-12-30 NOTE — Progress Notes (Signed)
Subjective:  Patient ID: Sabrina Baker, female    DOB: January 06, 1941,  MRN: [id number]  Chief Complaint  Patient presents with  . Follow-up    +f/u ulcer/cellulitis    79 y.o. female presents with the above complaint.  Patient presents with a follow-up of right third and fourth digit superficial ulceration with some redness.  Patient states she is doing well.  The redness has disappeared she has completed her antibiotic.  She her ulcerations have healed.  She denies any other acute complaints.  She would like to discuss debriding thickened elongated dystrophic toenails x10.  They are painful to walk on.  She is not able to debride herself.  She would like to have it done today.   Review of Systems: Negative except as noted in the HPI. Denies N/V/F/Ch.  Past Medical History:  Diagnosis Date  . Chronic kidney disease    stage 3  . Diabetes mellitus without complication ([place])   . Hypercholesteremia   . Hypertension   . Neuromuscular disorder (HCC)    neuropathy  . Osteopenia   . Retinal detachment   . TIA (transient ischemic attack)   . Vitamin B12 deficiency     Current Outpatient Medications:  .  amLODipine (NORVASC) 10 MG tablet, Take 10 mg by mouth daily., Disp: , Rfl:  .  apixaban (ELIQUIS) 5 MG TABS tablet, Take 1 tablet (5 mg total) by mouth 2 (two) times daily., Disp: 60 tablet, Rfl:  .  aspirin 325 MG EC tablet, HOLDING (on Eliquis), Disp: , Rfl:  .  atorvastatin (LIPITOR) 10 MG tablet, Take 10 mg by mouth daily., Disp: , Rfl:  .  bacitracin-polymyxin b (POLYSPORIN) ophthalmic ointment, Place 1 application into the left eye daily as needed (For eye infection). , Disp: , Rfl:  .  bevacizumab (AVASTIN) 400 MG/16ML SOLN, Inject 5 mg/kg into the vein once., Disp: , Rfl:  .  CALCIUM-MAGNESIUM-ZINC PO, Take 1 tablet by mouth in the morning and at bedtime. , Disp: , Rfl:  .  calcium-vitamin D (OSCAL WITH D) 500-200 MG-UNIT TABS tablet, Take 1 tablet by mouth daily. , Disp: , Rfl:    .  carvedilol (COREG) 25 MG tablet, Take 25 mg by mouth 2 (two) times daily with a meal., Disp: , Rfl:  .  cholecalciferol (VITAMIN D) 1000 UNITS tablet, Take 1,000 Units by mouth daily., Disp: , Rfl:  .  colchicine 0.6 MG tablet, Take 0.6 mg by mouth daily as needed (gout). Started 04/07/13, Disp: , Rfl:  .  [employer] B-12 500 MCG tablet, Take 1,000 mcg by mouth daily., Disp: , Rfl:  .  doxycycline (VIBRA-TABS) 100 MG tablet, Take 100 mg by mouth 2 (two) times daily., Disp: , Rfl:  .  DULoxetine (CYMBALTA) 60 MG capsule, Take by mouth daily., Disp: , Rfl:  .  Ferrous Sulfate (IRON) 325 (65 FE) MG TABS, Take 1 tablet by mouth 3 (three) times daily., Disp: , Rfl:  .  FLORASTOR 250 MG capsule, TAKE 1 CAPSULE BY MOUTH TWO TIMES DAILY FOR 10 DAYS., Disp: , Rfl:  .  furosemide (LASIX) 20 MG tablet, Take 20 mg by mouth daily., Disp: , Rfl:  .  gabapentin (NEURONTIN) 100 MG capsule, Take 100 mg by mouth at bedtime. , Disp: , Rfl: 3 .  HYDROcodone-acetaminophen (NORCO/VICODIN) 5-325 MG tablet, Take 1 tablet by mouth every 6 (six) hours as needed for moderate pain., Disp: 10 tablet, Rfl: 0 .  Insulin Glargine, 2 Unit Dial, (TOUJEO MAX SOLOSTAR)  300 UNIT/ML SOPN, Inject 14-15 Units into the skin daily. Inject 14 units when sugar is 157 if higher inject 15 units per patient, Disp: , Rfl:  .  Insulin Pen Needle (FIFTY50 PEN NEEDLES) 32G X 4 MM MISC, USE TO INJECT INSULIN ONCE A DAY, Disp: , Rfl:  .  ketoconazole (NIZORAL) 2 % shampoo, Apply 1 application topically 2 (two) times a week. , Disp: , Rfl:  .  Lancets 30G MISC, Use to test blood sugars 3 times a day (Dx: E11.65), Disp: , Rfl:  .  latanoprost (XALATAN) 0.005 % ophthalmic solution, Place 1 drop into the left eye at bedtime. , Disp: , Rfl: 4 .  Magnesium 500 MG CAPS, Take 1 capsule by mouth daily., Disp: , Rfl:  .  mupirocin cream (BACTROBAN) 2 %, Apply topically 2 (two) times daily. Apply to forehead wound until healed, Disp: 15 g, Rfl: 0 .  mupirocin  ointment (BACTROBAN) 2 %, APPLY TOPICALLY TWO TIMES DAILY. APPLY TO FOREHEAD WOUND UNTIL HEALED, Disp: , Rfl:  .  nateglinide (STARLIX) 120 MG tablet, Take 120 mg by mouth daily as needed (If sugar is below 130). , Disp: , Rfl:  .  ONETOUCH VERIO test strip, USE AS DIRECTED. TESTING FREQUENCY  3 X/DAILY (DX E11.65), Disp: , Rfl:  .  pantoprazole (PROTONIX) 40 MG tablet, Take 40 mg by mouth 2 (two) times daily., Disp: , Rfl:  .  triamcinolone cream (KENALOG) 0.1 %, Apply 1 application topically 2 (two) times daily as needed (For rash). , Disp: , Rfl:  .  TRULICITY 1.5 JJ/8.8CZ SOPN, Inject 1.5 mg into the skin every 7 (seven) days. , Disp: , Rfl:   Social History   Tobacco Use  Smoking Status Never Smoker  Smokeless Tobacco Never Used    Allergies  Allergen Reactions  . Altace [Ramipril] Anaphylaxis  . Cortisone Itching and Rash  . Diovan [Valsartan] Other (See Comments)    Hyperkalemia   . Niacin Diarrhea    And nausea  . Niacin And Related Diarrhea   Objective:  There were no vitals filed for this visit. There is no height or weight on file to calculate BMI. Constitutional Well developed. Well nourished.  Vascular Dorsalis pedis pulses faintly palpable bilaterally. Posterior tibial pulses faintly palpable bilaterally. Capillary refill normal to all digits.  No cyanosis or clubbing noted. Pedal hair growth normal.  Neurologic Normal speech. Oriented to person, place, and time. Epicritic sensation to light touch grossly present bilaterally.  Dermatologic Nails right third fourth digit superficial ulceration limited to the breakdown of the skin.  Mild erythema redness noted up to the metatarsophalangeal joint of third and fourth digit.  No concern for deeper infection or abscess formation.  No purulent drainage was expressed.  No blister formation noted. Nail Exam: Pt has thick disfigured discolored nails with subungual debris noted bilateral entire nail hallux through fifth  toenails.  Pain on palpation to the nails.  Orthopedic: Normal joint ROM without pain or crepitus bilaterally. No visible deformities. No bony tenderness.   Radiographs: None Assessment:   1. Diabetic peripheral neuropathy associated with type 2 diabetes mellitus ([place])   2. Skin ulcer of fourth toe, limited to breakdown of skin, right (HCC)   3. Pain due to onychomycosis of toenails of both feet    Plan:  Patient was evaluated and treated and all questions answered.  Right superficial third and fourth digit skin ulcer limited to the breakdown of the skin -Clinically healed.  Hold off  on Betadine wet-to-dry dressing changes.  Continue wearing surgical shoe for another week.  No further ulcerations noted.  The redness is also regressed completely.  Patient was evaluated and treated and all questions answered.  Onychomycosis with pain  -Nails palliatively debrided as below. -Educated on self-care  Procedure: Nail Debridement Rationale: pain  Type of Debridement: manual, sharp debridement. Instrumentation: Nail nipper, rotary burr. Number of Nails: 10  Procedures and Treatment: Consent by patient was obtained for treatment procedures. The patient understood the discussion of treatment and procedures well. All questions were answered thoroughly reviewed. Debridement of mycotic and hypertrophic toenails, 1 through 5 bilateral and clearing of subungual debris. No ulceration, no infection noted.  Return Visit-Office Procedure: Patient instructed to return to the office for a follow up visit 3 months for continued evaluation and treatment.  Boneta Lucks, DPM    No follow-ups on file.   No follow-ups on file.

## 2020-01-02 ENCOUNTER — Ambulatory Visit: Payer: Medicare Other | Admitting: Podiatry

## 2020-01-07 ENCOUNTER — Ambulatory Visit (INDEPENDENT_AMBULATORY_CARE_PROVIDER_SITE_OTHER): Payer: Medicare Other | Admitting: Podiatry

## 2020-01-07 ENCOUNTER — Other Ambulatory Visit: Payer: Self-pay

## 2020-01-07 DIAGNOSIS — L97511 Non-pressure chronic ulcer of other part of right foot limited to breakdown of skin: Secondary | ICD-10-CM

## 2020-01-07 DIAGNOSIS — E1142 Type 2 diabetes mellitus with diabetic polyneuropathy: Secondary | ICD-10-CM | POA: Diagnosis not present

## 2020-01-08 ENCOUNTER — Encounter: Payer: Self-pay | Admitting: Podiatry

## 2020-01-08 NOTE — Progress Notes (Signed)
Subjective:  Patient ID: Sabrina Baker, female    DOB: March 21, 1941,  MRN: [id number]  Chief Complaint  Patient presents with  . Follow-up    pt states she is doing better- shes able to do more daily act. seeing improvement- pt is overall doing well     79 y.o. female presents with the above complaint.  Patient presents with a follow-up of right third and fourth digit superficial ulceration with some erythema.  Patient states that it has completely resolved.  She does not have any redness.  The dressing in the antibiotics helped.  She denies any other acute complaints.  She has been able to walk without any pain.   Review of Systems: Negative except as noted in the HPI. Denies N/V/F/Ch.  Past Medical History:  Diagnosis Date  . Chronic kidney disease    stage 3  . Diabetes mellitus without complication ([place])   . Hypercholesteremia   . Hypertension   . Neuromuscular disorder (HCC)    neuropathy  . Osteopenia   . Retinal detachment   . TIA (transient ischemic attack)   . Vitamin B12 deficiency     Current Outpatient Medications:  .  amLODipine (NORVASC) 10 MG tablet, Take 10 mg by mouth daily., Disp: , Rfl:  .  apixaban (ELIQUIS) 5 MG TABS tablet, Take 1 tablet (5 mg total) by mouth 2 (two) times daily., Disp: 60 tablet, Rfl:  .  aspirin 325 MG EC tablet, HOLDING (on Eliquis), Disp: , Rfl:  .  atorvastatin (LIPITOR) 10 MG tablet, Take 10 mg by mouth daily., Disp: , Rfl:  .  bacitracin-polymyxin b (POLYSPORIN) ophthalmic ointment, Place 1 application into the left eye daily as needed (For eye infection). , Disp: , Rfl:  .  bevacizumab (AVASTIN) 400 MG/16ML SOLN, Inject 5 mg/kg into the vein once., Disp: , Rfl:  .  CALCIUM-MAGNESIUM-ZINC PO, Take 1 tablet by mouth in the morning and at bedtime. , Disp: , Rfl:  .  calcium-vitamin D (OSCAL WITH D) 500-200 MG-UNIT TABS tablet, Take 1 tablet by mouth daily. , Disp: , Rfl:  .  carvedilol (COREG) 25 MG tablet, Take 25 mg by mouth 2 (two)  times daily with a meal., Disp: , Rfl:  .  cholecalciferol (VITAMIN D) 1000 UNITS tablet, Take 1,000 Units by mouth daily., Disp: , Rfl:  .  colchicine 0.6 MG tablet, Take 0.6 mg by mouth daily as needed (gout). Started 04/07/13, Disp: , Rfl:  .  [employer] B-12 500 MCG tablet, Take 1,000 mcg by mouth daily., Disp: , Rfl:  .  doxycycline (VIBRA-TABS) 100 MG tablet, Take 100 mg by mouth 2 (two) times daily., Disp: , Rfl:  .  DULoxetine (CYMBALTA) 60 MG capsule, Take by mouth daily., Disp: , Rfl:  .  Ferrous Sulfate (IRON) 325 (65 FE) MG TABS, Take 1 tablet by mouth 3 (three) times daily., Disp: , Rfl:  .  FLORASTOR 250 MG capsule, TAKE 1 CAPSULE BY MOUTH TWO TIMES DAILY FOR 10 DAYS., Disp: , Rfl:  .  furosemide (LASIX) 20 MG tablet, Take 20 mg by mouth daily., Disp: , Rfl:  .  gabapentin (NEURONTIN) 100 MG capsule, Take 100 mg by mouth at bedtime. , Disp: , Rfl: 3 .  HYDROcodone-acetaminophen (NORCO/VICODIN) 5-325 MG tablet, Take 1 tablet by mouth every 6 (six) hours as needed for moderate pain., Disp: 10 tablet, Rfl: 0 .  Insulin Glargine, 2 Unit Dial, (TOUJEO MAX SOLOSTAR) 300 UNIT/ML SOPN, Inject 14-15 Units into the skin  daily. Inject 14 units when sugar is 157 if higher inject 15 units per patient, Disp: , Rfl:  .  Insulin Pen Needle (FIFTY50 PEN NEEDLES) 32G X 4 MM MISC, USE TO INJECT INSULIN ONCE A DAY, Disp: , Rfl:  .  ketoconazole (NIZORAL) 2 % shampoo, Apply 1 application topically 2 (two) times a week. , Disp: , Rfl:  .  Lancets 30G MISC, Use to test blood sugars 3 times a day (Dx: E11.65), Disp: , Rfl:  .  latanoprost (XALATAN) 0.005 % ophthalmic solution, Place 1 drop into the left eye at bedtime. , Disp: , Rfl: 4 .  Magnesium 500 MG CAPS, Take 1 capsule by mouth daily., Disp: , Rfl:  .  mupirocin cream (BACTROBAN) 2 %, Apply topically 2 (two) times daily. Apply to forehead wound until healed, Disp: 15 g, Rfl: 0 .  mupirocin ointment (BACTROBAN) 2 %, APPLY TOPICALLY TWO TIMES DAILY. APPLY TO  FOREHEAD WOUND UNTIL HEALED, Disp: , Rfl:  .  nateglinide (STARLIX) 120 MG tablet, Take 120 mg by mouth daily as needed (If sugar is below 130). , Disp: , Rfl:  .  ONETOUCH VERIO test strip, USE AS DIRECTED. TESTING FREQUENCY  3 X/DAILY (DX E11.65), Disp: , Rfl:  .  pantoprazole (PROTONIX) 40 MG tablet, Take 40 mg by mouth 2 (two) times daily., Disp: , Rfl:  .  triamcinolone cream (KENALOG) 0.1 %, Apply 1 application topically 2 (two) times daily as needed (For rash). , Disp: , Rfl:  .  TRULICITY 1.5 QP/5.9FM SOPN, Inject 1.5 mg into the skin every 7 (seven) days. , Disp: , Rfl:   Social History   Tobacco Use  Smoking Status Never Smoker  Smokeless Tobacco Never Used    Allergies  Allergen Reactions  . Altace [Ramipril] Anaphylaxis  . Cortisone Itching and Rash  . Diovan [Valsartan] Other (See Comments)    Hyperkalemia   . Niacin Diarrhea    And nausea  . Niacin And Related Diarrhea   Objective:  There were no vitals filed for this visit. There is no height or weight on file to calculate BMI. Constitutional Well developed. Well nourished.  Vascular Dorsalis pedis pulses faintly palpable bilaterally. Posterior tibial pulses faintly palpable bilaterally. Capillary refill normal to all digits.  No cyanosis or clubbing noted. Pedal hair growth normal.  Neurologic Normal speech. Oriented to person, place, and time. Epicritic sensation to light touch grossly present bilaterally.  Dermatologic Nails right third fourth digit superficial ulceration limited to the breakdown of the skin.  No erythema/redness noted noted up to the metatarsophalangeal joint of third and fourth digit.  No concern for deeper infection or abscess formation.  No purulent drainage was expressed.  No blister formation noted. Nail Exam: Pt has thick disfigured discolored nails with subungual debris noted bilateral entire nail hallux through fifth toenails.  Pain on palpation to the nails.  Orthopedic: Normal  joint ROM without pain or crepitus bilaterally. No visible deformities. No bony tenderness.   Radiographs: None Assessment:   1. Diabetic peripheral neuropathy associated with type 2 diabetes mellitus ([place])   2. Skin ulcer of fourth toe, limited to breakdown of skin, right [facility])    Plan:  Patient was evaluated and treated and all questions answered.  Right superficial third and fourth digit skin ulcer limited to the breakdown of the skin -Clinically healed.  No further acute complaints.  If there is any foot and ankle issues arises or if this recurs have asked the patient to  come back and see me.  Patient states understanding. -Patient will return to see Dr. Consuelo Pandy for routine foot care.  Boneta Lucks, DPM    No follow-ups on file.   No follow-ups on file.

## 2020-01-28 ENCOUNTER — Other Ambulatory Visit: Payer: Self-pay

## 2020-01-28 ENCOUNTER — Encounter (INDEPENDENT_AMBULATORY_CARE_PROVIDER_SITE_OTHER): Payer: Medicare Other | Admitting: Ophthalmology

## 2020-01-28 DIAGNOSIS — H35033 Hypertensive retinopathy, bilateral: Secondary | ICD-10-CM

## 2020-01-28 DIAGNOSIS — E113511 Type 2 diabetes mellitus with proliferative diabetic retinopathy with macular edema, right eye: Secondary | ICD-10-CM

## 2020-01-28 DIAGNOSIS — I10 Essential (primary) hypertension: Secondary | ICD-10-CM

## 2020-01-28 DIAGNOSIS — E113312 Type 2 diabetes mellitus with moderate nonproliferative diabetic retinopathy with macular edema, left eye: Secondary | ICD-10-CM

## 2020-01-28 DIAGNOSIS — H353122 Nonexudative age-related macular degeneration, left eye, intermediate dry stage: Secondary | ICD-10-CM

## 2020-01-28 DIAGNOSIS — E11311 Type 2 diabetes mellitus with unspecified diabetic retinopathy with macular edema: Secondary | ICD-10-CM | POA: Diagnosis not present

## 2020-01-28 DIAGNOSIS — H338 Other retinal detachments: Secondary | ICD-10-CM

## 2020-01-28 DIAGNOSIS — H43813 Vitreous degeneration, bilateral: Secondary | ICD-10-CM

## 2020-01-28 DIAGNOSIS — H35371 Puckering of macula, right eye: Secondary | ICD-10-CM

## 2020-02-04 ENCOUNTER — Other Ambulatory Visit: Payer: Self-pay | Admitting: Family Medicine

## 2020-02-04 DIAGNOSIS — Z1231 Encounter for screening mammogram for malignant neoplasm of breast: Secondary | ICD-10-CM

## 2020-02-04 DIAGNOSIS — M8580 Other specified disorders of bone density and structure, unspecified site: Secondary | ICD-10-CM

## 2020-02-27 ENCOUNTER — Ambulatory Visit
Admission: RE | Admit: 2020-02-27 | Discharge: 2020-02-27 | Disposition: A | Discharge status: Home / Self Care | Payer: Medicare Other | Source: Ambulatory Visit | Attending: Family Medicine | Admitting: Family Medicine

## 2020-02-27 ENCOUNTER — Other Ambulatory Visit: Payer: Self-pay

## 2020-02-27 DIAGNOSIS — Z1231 Encounter for screening mammogram for malignant neoplasm of breast: Secondary | ICD-10-CM

## 2020-03-03 ENCOUNTER — Encounter (INDEPENDENT_AMBULATORY_CARE_PROVIDER_SITE_OTHER): Payer: Medicare Other | Admitting: Ophthalmology

## 2020-03-03 ENCOUNTER — Other Ambulatory Visit: Payer: Self-pay

## 2020-03-03 DIAGNOSIS — I10 Essential (primary) hypertension: Secondary | ICD-10-CM | POA: Diagnosis not present

## 2020-03-03 DIAGNOSIS — E113511 Type 2 diabetes mellitus with proliferative diabetic retinopathy with macular edema, right eye: Secondary | ICD-10-CM

## 2020-03-03 DIAGNOSIS — E11311 Type 2 diabetes mellitus with unspecified diabetic retinopathy with macular edema: Secondary | ICD-10-CM | POA: Diagnosis not present

## 2020-03-03 DIAGNOSIS — E113312 Type 2 diabetes mellitus with moderate nonproliferative diabetic retinopathy with macular edema, left eye: Secondary | ICD-10-CM

## 2020-03-03 DIAGNOSIS — H338 Other retinal detachments: Secondary | ICD-10-CM

## 2020-03-03 DIAGNOSIS — H35033 Hypertensive retinopathy, bilateral: Secondary | ICD-10-CM

## 2020-03-03 DIAGNOSIS — H43812 Vitreous degeneration, left eye: Secondary | ICD-10-CM

## 2020-04-07 ENCOUNTER — Encounter (INDEPENDENT_AMBULATORY_CARE_PROVIDER_SITE_OTHER): Payer: Medicare Other | Admitting: Ophthalmology

## 2020-04-07 ENCOUNTER — Other Ambulatory Visit: Payer: Self-pay

## 2020-04-07 DIAGNOSIS — E113511 Type 2 diabetes mellitus with proliferative diabetic retinopathy with macular edema, right eye: Secondary | ICD-10-CM | POA: Diagnosis not present

## 2020-04-07 DIAGNOSIS — H338 Other retinal detachments: Secondary | ICD-10-CM

## 2020-04-07 DIAGNOSIS — E113212 Type 2 diabetes mellitus with mild nonproliferative diabetic retinopathy with macular edema, left eye: Secondary | ICD-10-CM | POA: Diagnosis not present

## 2020-04-07 DIAGNOSIS — I10 Essential (primary) hypertension: Secondary | ICD-10-CM

## 2020-04-07 DIAGNOSIS — H35033 Hypertensive retinopathy, bilateral: Secondary | ICD-10-CM

## 2020-04-07 DIAGNOSIS — H43812 Vitreous degeneration, left eye: Secondary | ICD-10-CM

## 2020-04-07 DIAGNOSIS — E11311 Type 2 diabetes mellitus with unspecified diabetic retinopathy with macular edema: Secondary | ICD-10-CM

## 2020-04-07 DIAGNOSIS — H35372 Puckering of macula, left eye: Secondary | ICD-10-CM

## 2020-05-05 ENCOUNTER — Other Ambulatory Visit: Payer: Self-pay

## 2020-05-05 ENCOUNTER — Encounter (INDEPENDENT_AMBULATORY_CARE_PROVIDER_SITE_OTHER): Payer: Medicare Other | Admitting: Ophthalmology

## 2020-05-05 DIAGNOSIS — H35033 Hypertensive retinopathy, bilateral: Secondary | ICD-10-CM

## 2020-05-05 DIAGNOSIS — H35372 Puckering of macula, left eye: Secondary | ICD-10-CM

## 2020-05-05 DIAGNOSIS — E113511 Type 2 diabetes mellitus with proliferative diabetic retinopathy with macular edema, right eye: Secondary | ICD-10-CM | POA: Diagnosis not present

## 2020-05-05 DIAGNOSIS — E113212 Type 2 diabetes mellitus with mild nonproliferative diabetic retinopathy with macular edema, left eye: Secondary | ICD-10-CM

## 2020-05-05 DIAGNOSIS — I10 Essential (primary) hypertension: Secondary | ICD-10-CM | POA: Diagnosis not present

## 2020-05-05 DIAGNOSIS — H43812 Vitreous degeneration, left eye: Secondary | ICD-10-CM

## 2020-05-05 DIAGNOSIS — E11311 Type 2 diabetes mellitus with unspecified diabetic retinopathy with macular edema: Secondary | ICD-10-CM | POA: Diagnosis not present

## 2020-05-17 ENCOUNTER — Ambulatory Visit
Admission: RE | Admit: 2020-05-17 | Discharge: 2020-05-17 | Disposition: A | Discharge status: Home / Self Care | Payer: Medicare Other | Source: Ambulatory Visit | Attending: Family Medicine | Admitting: Family Medicine

## 2020-05-17 ENCOUNTER — Other Ambulatory Visit: Payer: Self-pay

## 2020-05-17 DIAGNOSIS — M8580 Other specified disorders of bone density and structure, unspecified site: Secondary | ICD-10-CM

## 2020-06-02 ENCOUNTER — Encounter (INDEPENDENT_AMBULATORY_CARE_PROVIDER_SITE_OTHER): Payer: Medicare Other | Admitting: Ophthalmology

## 2020-06-02 ENCOUNTER — Other Ambulatory Visit: Payer: Self-pay

## 2020-06-02 DIAGNOSIS — I10 Essential (primary) hypertension: Secondary | ICD-10-CM

## 2020-06-02 DIAGNOSIS — E113312 Type 2 diabetes mellitus with moderate nonproliferative diabetic retinopathy with macular edema, left eye: Secondary | ICD-10-CM | POA: Diagnosis not present

## 2020-06-02 DIAGNOSIS — H43812 Vitreous degeneration, left eye: Secondary | ICD-10-CM

## 2020-06-02 DIAGNOSIS — H35033 Hypertensive retinopathy, bilateral: Secondary | ICD-10-CM

## 2020-06-02 DIAGNOSIS — E113591 Type 2 diabetes mellitus with proliferative diabetic retinopathy without macular edema, right eye: Secondary | ICD-10-CM

## 2020-06-16 DIAGNOSIS — E1165 Type 2 diabetes mellitus with hyperglycemia: Secondary | ICD-10-CM | POA: Diagnosis not present

## 2020-06-16 DIAGNOSIS — N1832 Chronic kidney disease, stage 3b: Secondary | ICD-10-CM | POA: Diagnosis not present

## 2020-06-16 DIAGNOSIS — Z794 Long term (current) use of insulin: Secondary | ICD-10-CM | POA: Diagnosis not present

## 2020-06-16 DIAGNOSIS — I10 Essential (primary) hypertension: Secondary | ICD-10-CM | POA: Diagnosis not present

## 2020-06-18 DIAGNOSIS — I129 Hypertensive chronic kidney disease with stage 1 through stage 4 chronic kidney disease, or unspecified chronic kidney disease: Secondary | ICD-10-CM | POA: Diagnosis not present

## 2020-06-18 DIAGNOSIS — Z794 Long term (current) use of insulin: Secondary | ICD-10-CM | POA: Diagnosis not present

## 2020-06-18 DIAGNOSIS — N1832 Chronic kidney disease, stage 3b: Secondary | ICD-10-CM | POA: Diagnosis not present

## 2020-06-18 DIAGNOSIS — E1142 Type 2 diabetes mellitus with diabetic polyneuropathy: Secondary | ICD-10-CM | POA: Diagnosis not present

## 2020-06-18 DIAGNOSIS — E1122 Type 2 diabetes mellitus with diabetic chronic kidney disease: Secondary | ICD-10-CM | POA: Diagnosis not present

## 2020-06-18 DIAGNOSIS — E782 Mixed hyperlipidemia: Secondary | ICD-10-CM | POA: Diagnosis not present

## 2020-06-18 DIAGNOSIS — E538 Deficiency of other specified B group vitamins: Secondary | ICD-10-CM | POA: Diagnosis not present

## 2020-06-18 DIAGNOSIS — E1165 Type 2 diabetes mellitus with hyperglycemia: Secondary | ICD-10-CM | POA: Diagnosis not present

## 2020-06-18 DIAGNOSIS — E11610 Type 2 diabetes mellitus with diabetic neuropathic arthropathy: Secondary | ICD-10-CM | POA: Diagnosis not present

## 2020-06-18 DIAGNOSIS — M8589 Other specified disorders of bone density and structure, multiple sites: Secondary | ICD-10-CM | POA: Diagnosis not present

## 2020-06-24 NOTE — Progress Notes (Signed)
Cardiology Office Note:    Date:  06/25/2020   ID:  Jeral Fruit, DOB [birth year], MRN [id number]  PCP:  [name], MD  [facility] Cardiologist:  Mertie Moores, MD   [facility] Electrophysiologist:  None   Referring MD: [name], MD   Chief Complaint:  Follow-up (AFIB)    Patient Profile:    Sabrina REINERTSEN is a 80 y.o. female with:   Paroxysmal atrial fibrillation   Diabetes mellitus   Hypertension   Hyperlipidemia   Hx of TIA  Hx of COVID-19 in 09/2018  Required intubation; prolonged admission (3/30-5/1)  Chronic kidney disease    Prior CV studies: Echocardiogram 12/27/2018 EF 60-65, moderate LVH, GR 1 DD, no RWMA, and normal RVSF, mild aortic stenosis (mean 8 mmHg), mild MAC, mild MR, trivial posterior pericardial effusion  Carotid US 04/18/2012 R 40-59 (by velocity; plaque morphology does not support this finding)  History of Present Illness:    Ms. Wineland was last seen by Dr. Acie Fredrickson in 1/21.  She returns for follow-up.  She is here alone.  She does not have palpitations, exertional chest discomfort, shortness of breath, syncope, leg edema.      Past Medical History:  Diagnosis Date  . Chronic kidney disease    stage 3  . Diabetes mellitus without complication ([place])   . Hypercholesteremia   . Hypertension   . Neuromuscular disorder (HCC)    neuropathy  . Osteopenia   . Retinal detachment   . TIA (transient ischemic attack)   . Vitamin B12 deficiency     Current Medications: Current Meds  Medication Sig  . amLODipine (NORVASC) 10 MG tablet Take 10 mg by mouth daily.  Marland Kitchen apixaban (ELIQUIS) 5 MG TABS tablet Take 1 tablet (5 mg total) by mouth 2 (two) times daily.  Marland Kitchen atorvastatin (LIPITOR) 10 MG tablet Take 10 mg by mouth daily.  . bacitracin-polymyxin b (POLYSPORIN) ophthalmic ointment Place 1 application into the left eye daily as needed (For eye infection).   . bevacizumab (AVASTIN) 400 MG/16ML SOLN Inject 5 mg/kg into the vein  once.  Marland Kitchen CALCIUM-MAGNESIUM-ZINC PO Take 1 tablet by mouth in the morning and at bedtime.   . calcium-vitamin D (OSCAL WITH D) 500-200 MG-UNIT TABS tablet Take 1 tablet by mouth daily.   . carvedilol (COREG) 25 MG tablet Take 25 mg by mouth 2 (two) times daily with a meal.  . cholecalciferol (VITAMIN D) 1000 UNITS tablet Take 1,000 Units by mouth daily.  . colchicine 0.6 MG tablet Take 0.6 mg by mouth daily as needed (gout). Started 04/07/13  . CVS B-12 500 MCG tablet Take 1,000 mcg by mouth daily.  . DULoxetine (CYMBALTA) 60 MG capsule Take by mouth daily.  . Ferrous Sulfate (IRON) 325 (65 FE) MG TABS Take 1 tablet by mouth 3 (three) times daily.  Marland Kitchen FLORASTOR 250 MG capsule TAKE 1 CAPSULE BY MOUTH TWO TIMES DAILY FOR 10 DAYS.  . furosemide (LASIX) 20 MG tablet Take 20 mg by mouth daily.  Marland Kitchen gabapentin (NEURONTIN) 100 MG capsule Take 200 mg by mouth at bedtime.  Marland Kitchen HYDROcodone-acetaminophen (NORCO/VICODIN) 5-325 MG tablet Take 1 tablet by mouth every 6 (six) hours as needed for moderate pain.  . Insulin Glargine, 2 Unit Dial, (TOUJEO MAX SOLOSTAR) 300 UNIT/ML SOPN Inject 14-15 Units into the skin daily. Inject 14 units when sugar is 157 if higher inject 15 units per patient  . Insulin Pen Needle 32G X 4 MM MISC USE TO INJECT INSULIN  ONCE A DAY  . ketoconazole (NIZORAL) 2 % shampoo Apply 1 application topically 2 (two) times a week.   . Lancets 30G MISC Use to test blood sugars 3 times a day (Dx: E11.65)  . latanoprost (XALATAN) 0.005 % ophthalmic solution Place 1 drop into the left eye at bedtime.   . Magnesium 500 MG CAPS Take 1 capsule by mouth daily.  . mupirocin cream (BACTROBAN) 2 % Apply topically 2 (two) times daily. Apply to forehead wound until healed  . mupirocin ointment (BACTROBAN) 2 % APPLY TOPICALLY TWO TIMES DAILY. APPLY TO FOREHEAD WOUND UNTIL HEALED  . ONETOUCH VERIO test strip USE AS DIRECTED. TESTING FREQUENCY  3 X/DAILY (DX E11.65)  . pantoprazole (PROTONIX) 40 MG tablet Take  40 mg by mouth 2 (two) times daily.  Marland Kitchen triamcinolone cream (KENALOG) 0.1 % Apply 1 application topically 2 (two) times daily as needed (For rash).   . TRULICITY 1.5 AL/9.3XT SOPN Inject 3 mg into the skin every 7 (seven) days.  . [DISCONTINUED] aspirin 325 MG EC tablet HOLDING (on Eliquis)     Allergies:   Altace [ramipril], Cortisone, Diovan [valsartan], Niacin, and Niacin and related   Social History   Tobacco Use  . Smoking status: Never Smoker  . Smokeless tobacco: Never Used  Vaping Use  . Vaping Use: Never used  Substance Use Topics  . Alcohol use: No  . Drug use: No     Family Hx: The patient's family history is not on file.  Review of Systems  Gastrointestinal: Negative for hematochezia and melena.  Genitourinary: Negative for hematuria.     EKGs/Labs/Other Test Reviewed:    EKG:  EKG is   ordered today.  The ekg ordered today demonstrates normal sinus rhythm, leftward axis, septal Q waves, PAC, increased artifact  Recent Labs: 11/19/2019: ALT 14 11/25/2019: BUN 15; Creatinine, Ser 1.27; Hemoglobin 12.0; Platelets 326; Potassium 4.0; Sodium 141   Recent Lipid Panel Lab Results  Component Value Date/Time   CHOL 134 04/18/2012 05:40 AM   TRIG 76 09/22/2018 02:54 AM   HDL 58 04/18/2012 05:40 AM   CHOLHDL 2.3 04/18/2012 05:40 AM   LDLCALC 62 04/18/2012 05:40 AM    Labs obtained through [facility]- personally reviewed and interpreted: 03/01/2020: LDL 55 06/16/2020: K+ 5.1, creatinine 1.22, ALT 11, hemoglobin 13.5  Risk Assessment/Calculations:     CHA2DS2-VASc Score = 8  This indicates a 10.8% annual risk of stroke. The patient's score is based upon: CHF History: No HTN History: Yes Diabetes History: Yes Stroke History: Yes Vascular Disease History: Yes Age Score: 2 Gender Score: 1     Physical Exam:    VS:  BP (!) 110/58   Pulse 95   Ht _0  (1.6 m)   Wt 169 lb (76.7 kg)   SpO2 92%   BMI 29.94 kg/m     Wt Readings from Last 3 Encounters:   06/25/20 169 lb (76.7 kg)  12/01/19 164 lb (74.4 kg)  11/20/19 168 lb 4.8 oz (76.3 kg)     Constitutional:      Appearance: Healthy appearance. Not in distress.  Neck:     Vascular: Carotid bruit (right) present. JVD normal.  Pulmonary:     Effort: Pulmonary effort is normal.     Breath sounds: No wheezing. No rales.  Cardiovascular:     Normal rate. Regular rhythm. Normal S1. Normal S2.     Murmurs: There is a grade 2/6 crescendo-decrescendo systolic murmur at the URSB and LLSB.  Edema:    Peripheral edema absent.  Abdominal:     Palpations: Abdomen is soft. There is no hepatomegaly.  Skin:    General: Skin is warm and dry.  Neurological:     Mental Status: Alert and oriented to person, place and time.     Cranial Nerves: Cranial nerves are intact.       ASSESSMENT & PLAN:    1. PAF (paroxysmal atrial fibrillation) (HCC) CHA2DS2-VASc Score = 8 [CHF History: No, HTN History: Yes, Diabetes History: Yes, Stroke History: Yes, Vascular Disease History: Yes, Age Score: 2, Gender Score: 1].  Therefore, the patient's annual risk of stroke is 10.8 %.   Maintaining sinus rhythm.  Continue current dose of Apixaban.  She does have aspirin on her medicine list.  She does not need to take aspirin as long as she is on Apixaban.  2. Essential hypertension The patient's blood pressure is controlled on her current regimen.  Continue current therapy.   3. Type 2 diabetes mellitus with diabetic neuropathy, with long-term current use of insulin ([place]) Continue follow-up with endocrinology.  4. Mixed dyslipidemia LDL optimal on most recent lab work.  Continue current Rx.    5. Stenosis of right carotid artery She had suggestion of 40 to 59% stenosis on the right by carotid ultrasound in 2013.  She has not had follow-up since.  She may have a bruit on the right versus radiating murmur.  Obtain follow-up carotid ultrasound.  Continue atorvastatin.     Dispo:  Return in about 1 year (around  06/25/2021) for Routine Follow Up, w/ Dr. Acie Fredrickson, or Richardson Dopp, PA-C, in person.   Medication Adjustments/Labs and Tests Ordered: Current medicines are reviewed at length with the patient today.  Concerns regarding medicines are outlined above.  Tests Ordered: Orders Placed This Encounter  Procedures  . EKG 12-Lead  . VAS US CAROTID   Medication Changes: No orders of the defined types were placed in this encounter.   Signed, Richardson Dopp, PA-C  06/25/2020 11:12 AM    [address], [place], [place]  [address] Phone: [phone]; Fax: [phone]

## 2020-06-25 ENCOUNTER — Other Ambulatory Visit: Payer: Self-pay

## 2020-06-25 ENCOUNTER — Encounter: Payer: Self-pay | Admitting: Physician Assistant

## 2020-06-25 ENCOUNTER — Ambulatory Visit (INDEPENDENT_AMBULATORY_CARE_PROVIDER_SITE_OTHER): Payer: Medicare Other | Admitting: Physician Assistant

## 2020-06-25 VITALS — BP 110/58 | HR 95 | Ht 63.0 in | Wt 169.0 lb

## 2020-06-25 DIAGNOSIS — I480 Paroxysmal atrial fibrillation: Secondary | ICD-10-CM | POA: Diagnosis not present

## 2020-06-25 DIAGNOSIS — E1140 Type 2 diabetes mellitus with diabetic neuropathy, unspecified: Secondary | ICD-10-CM

## 2020-06-25 DIAGNOSIS — E782 Mixed hyperlipidemia: Secondary | ICD-10-CM

## 2020-06-25 DIAGNOSIS — Z794 Long term (current) use of insulin: Secondary | ICD-10-CM

## 2020-06-25 DIAGNOSIS — I6521 Occlusion and stenosis of right carotid artery: Secondary | ICD-10-CM

## 2020-06-25 DIAGNOSIS — I10 Essential (primary) hypertension: Secondary | ICD-10-CM | POA: Diagnosis not present

## 2020-06-25 NOTE — Patient Instructions (Signed)
Medication Instructions:  Your physician recommends that you continue on your current medications as directed. Please refer to the Current Medication list given to you today.  *If you need a refill on your cardiac medications before your next appointment, please call your pharmacy*  Lab Work: None ordered today  Testing/Procedures: Your physician has requested that you have a carotid duplex. This test is an ultrasound of the carotid arteries in your neck. It looks at blood flow through these arteries that supply the brain with blood. Allow one hour for this exam. There are no restrictions or special instructions.  Follow-Up: At [facility], you and your health needs are our priority.  As part of our continuing mission to provide you with exceptional heart care, we have created designated Provider Care Teams.  These Care Teams include your primary Cardiologist (physician) and Advanced Practice Providers (APPs -  Physician Assistants and Nurse Practitioners) who all work together to provide you with the care you need, when you need it.  Your next appointment:   12 month(s)  The format for your next appointment:   In Person  Provider:   You may see Mertie Moores, MD or Richardson Dopp, PA-C

## 2020-06-30 ENCOUNTER — Encounter (INDEPENDENT_AMBULATORY_CARE_PROVIDER_SITE_OTHER): Payer: Medicare Other | Admitting: Ophthalmology

## 2020-07-09 ENCOUNTER — Encounter: Payer: Self-pay | Admitting: Physician Assistant

## 2020-07-09 ENCOUNTER — Ambulatory Visit (HOSPITAL_COMMUNITY)
Admission: RE | Admit: 2020-07-09 | Discharge: 2020-07-09 | Disposition: A | Discharge status: Home / Self Care | Payer: Medicare Other | Source: Ambulatory Visit | Attending: Internal Medicine | Admitting: Internal Medicine

## 2020-07-09 ENCOUNTER — Other Ambulatory Visit: Payer: Self-pay

## 2020-07-09 DIAGNOSIS — N2581 Secondary hyperparathyroidism of renal origin: Secondary | ICD-10-CM | POA: Diagnosis not present

## 2020-07-09 DIAGNOSIS — I6521 Occlusion and stenosis of right carotid artery: Secondary | ICD-10-CM | POA: Diagnosis not present

## 2020-07-09 DIAGNOSIS — D631 Anemia in chronic kidney disease: Secondary | ICD-10-CM | POA: Diagnosis not present

## 2020-07-09 DIAGNOSIS — I129 Hypertensive chronic kidney disease with stage 1 through stage 4 chronic kidney disease, or unspecified chronic kidney disease: Secondary | ICD-10-CM | POA: Diagnosis not present

## 2020-07-09 DIAGNOSIS — E875 Hyperkalemia: Secondary | ICD-10-CM | POA: Diagnosis not present

## 2020-07-09 DIAGNOSIS — N1832 Chronic kidney disease, stage 3b: Secondary | ICD-10-CM | POA: Diagnosis not present

## 2020-07-09 DIAGNOSIS — E1122 Type 2 diabetes mellitus with diabetic chronic kidney disease: Secondary | ICD-10-CM | POA: Diagnosis not present

## 2020-07-12 ENCOUNTER — Other Ambulatory Visit: Payer: Self-pay

## 2020-07-12 ENCOUNTER — Encounter (INDEPENDENT_AMBULATORY_CARE_PROVIDER_SITE_OTHER): Payer: Medicare Other | Admitting: Ophthalmology

## 2020-07-12 DIAGNOSIS — I10 Essential (primary) hypertension: Secondary | ICD-10-CM

## 2020-07-12 DIAGNOSIS — H338 Other retinal detachments: Secondary | ICD-10-CM

## 2020-07-12 DIAGNOSIS — E113212 Type 2 diabetes mellitus with mild nonproliferative diabetic retinopathy with macular edema, left eye: Secondary | ICD-10-CM

## 2020-07-12 DIAGNOSIS — H353122 Nonexudative age-related macular degeneration, left eye, intermediate dry stage: Secondary | ICD-10-CM

## 2020-07-12 DIAGNOSIS — H43812 Vitreous degeneration, left eye: Secondary | ICD-10-CM | POA: Diagnosis not present

## 2020-07-12 DIAGNOSIS — E113591 Type 2 diabetes mellitus with proliferative diabetic retinopathy without macular edema, right eye: Secondary | ICD-10-CM | POA: Diagnosis not present

## 2020-07-12 DIAGNOSIS — H35033 Hypertensive retinopathy, bilateral: Secondary | ICD-10-CM

## 2020-07-22 ENCOUNTER — Other Ambulatory Visit: Payer: Self-pay | Admitting: Family Medicine

## 2020-07-22 DIAGNOSIS — N289 Disorder of kidney and ureter, unspecified: Secondary | ICD-10-CM

## 2020-08-02 DIAGNOSIS — Z9889 Other specified postprocedural states: Secondary | ICD-10-CM | POA: Diagnosis not present

## 2020-08-02 DIAGNOSIS — Z961 Presence of intraocular lens: Secondary | ICD-10-CM | POA: Diagnosis not present

## 2020-08-02 DIAGNOSIS — H16231 Neurotrophic keratoconjunctivitis, right eye: Secondary | ICD-10-CM | POA: Diagnosis not present

## 2020-08-02 DIAGNOSIS — H1711 Central corneal opacity, right eye: Secondary | ICD-10-CM | POA: Diagnosis not present

## 2020-08-02 DIAGNOSIS — H401113 Primary open-angle glaucoma, right eye, severe stage: Secondary | ICD-10-CM | POA: Diagnosis not present

## 2020-08-02 DIAGNOSIS — H40022 Open angle with borderline findings, high risk, left eye: Secondary | ICD-10-CM | POA: Diagnosis not present

## 2020-08-04 ENCOUNTER — Ambulatory Visit
Admission: RE | Admit: 2020-08-04 | Discharge: 2020-08-04 | Disposition: A | Discharge status: Home / Self Care | Payer: Medicare Other | Source: Ambulatory Visit | Attending: Family Medicine | Admitting: Family Medicine

## 2020-08-04 DIAGNOSIS — N289 Disorder of kidney and ureter, unspecified: Secondary | ICD-10-CM

## 2020-08-04 DIAGNOSIS — N189 Chronic kidney disease, unspecified: Secondary | ICD-10-CM | POA: Diagnosis not present

## 2020-08-04 DIAGNOSIS — E882 Lipomatosis, not elsewhere classified: Secondary | ICD-10-CM | POA: Diagnosis not present

## 2020-08-04 DIAGNOSIS — N2889 Other specified disorders of kidney and ureter: Secondary | ICD-10-CM | POA: Diagnosis not present

## 2020-08-04 DIAGNOSIS — N281 Cyst of kidney, acquired: Secondary | ICD-10-CM | POA: Diagnosis not present

## 2020-08-13 ENCOUNTER — Other Ambulatory Visit: Payer: Self-pay

## 2020-08-13 ENCOUNTER — Ambulatory Visit (INDEPENDENT_AMBULATORY_CARE_PROVIDER_SITE_OTHER): Payer: Medicare Other | Admitting: Podiatry

## 2020-08-13 DIAGNOSIS — B351 Tinea unguium: Secondary | ICD-10-CM

## 2020-08-13 DIAGNOSIS — M2041 Other hammer toe(s) (acquired), right foot: Secondary | ICD-10-CM | POA: Diagnosis not present

## 2020-08-13 DIAGNOSIS — M2042 Other hammer toe(s) (acquired), left foot: Secondary | ICD-10-CM | POA: Diagnosis not present

## 2020-08-13 DIAGNOSIS — M79674 Pain in right toe(s): Secondary | ICD-10-CM | POA: Diagnosis not present

## 2020-08-13 DIAGNOSIS — M79675 Pain in left toe(s): Secondary | ICD-10-CM

## 2020-08-13 DIAGNOSIS — E1142 Type 2 diabetes mellitus with diabetic polyneuropathy: Secondary | ICD-10-CM

## 2020-08-16 ENCOUNTER — Other Ambulatory Visit: Payer: Self-pay

## 2020-08-16 ENCOUNTER — Encounter (INDEPENDENT_AMBULATORY_CARE_PROVIDER_SITE_OTHER): Payer: Medicare Other | Admitting: Ophthalmology

## 2020-08-16 DIAGNOSIS — E113511 Type 2 diabetes mellitus with proliferative diabetic retinopathy with macular edema, right eye: Secondary | ICD-10-CM | POA: Diagnosis not present

## 2020-08-16 DIAGNOSIS — I10 Essential (primary) hypertension: Secondary | ICD-10-CM | POA: Diagnosis not present

## 2020-08-16 DIAGNOSIS — H43812 Vitreous degeneration, left eye: Secondary | ICD-10-CM | POA: Diagnosis not present

## 2020-08-16 DIAGNOSIS — E113312 Type 2 diabetes mellitus with moderate nonproliferative diabetic retinopathy with macular edema, left eye: Secondary | ICD-10-CM | POA: Diagnosis not present

## 2020-08-16 DIAGNOSIS — H35033 Hypertensive retinopathy, bilateral: Secondary | ICD-10-CM

## 2020-08-16 DIAGNOSIS — H353122 Nonexudative age-related macular degeneration, left eye, intermediate dry stage: Secondary | ICD-10-CM | POA: Diagnosis not present

## 2020-08-17 ENCOUNTER — Encounter: Payer: Self-pay | Admitting: Podiatry

## 2020-08-17 NOTE — Progress Notes (Signed)
  Subjective:  Patient ID: Sabrina Baker, female    DOB: 11-21-1940,  MRN: [id number]  Chief Complaint  Patient presents with  . Nail Problem    Routine foot care In need of nail trimming- nails are now long thick and discoloration-curving underneath toes- further evaluation    80 y.o. female returns for the above complaint.  Patient presents with thickened elongated dystrophic toenails x10.  Pain on palpation.  Patient would like for me to debride down which is not able to do it herself.  She denies any other acute complaints.  Objective:  There were no vitals filed for this visit. Podiatric Exam: Vascular: dorsalis pedis and posterior tibial pulses are palpable bilateral. Capillary return is immediate. Temperature gradient is WNL. Skin turgor WNL  Sensorium: Normal Semmes Weinstein monofilament test. Normal tactile sensation bilaterally. Nail Exam: Pt has thick disfigured discolored nails with subungual debris noted bilateral entire nail hallux through fifth toenails.  Pain on palpation to the nails. Ulcer Exam: There is no evidence of ulcer or pre-ulcerative changes or infection. Orthopedic Exam: Muscle tone and strength are WNL. No limitations in general ROM. No crepitus or effusions noted. HAV  B/L.  Hammer toes 2-5  B/L. Skin: No Porokeratosis. No infection or ulcers    Assessment & Plan:   1. Hammer toes of both feet   2. Diabetic peripheral neuropathy associated with type 2 diabetes mellitus (HCC)   3. Pain due to onychomycosis of toenails of both feet     Patient was evaluated and treated and all questions answered.  Hammertoes 2 through 5 bilateral -I explained to the patient the etiology of hammertoe contractures and various treatment options were discussed.  Given the amount of pain she is having I believe patient will benefit from diabetic shoes to prevent ulceration given the contractures.  She will follow-up with Liliane Channel for diabetic shoes.   Onychomycosis with pain   -Nails palliatively debrided as below. -Educated on self-care  Procedure: Nail Debridement Rationale: pain  Type of Debridement: manual, sharp debridement. Instrumentation: Nail nipper, rotary burr. Number of Nails: 10  Procedures and Treatment: Consent by patient was obtained for treatment procedures. The patient understood the discussion of treatment and procedures well. All questions were answered thoroughly reviewed. Debridement of mycotic and hypertrophic toenails, 1 through 5 bilateral and clearing of subungual debris. No ulceration, no infection noted.  Return Visit-Office Procedure: Patient instructed to return to the office for a follow up visit 3 months for continued evaluation and treatment.  Boneta Lucks, DPM    No follow-ups on file.

## 2020-08-20 DIAGNOSIS — N183 Chronic kidney disease, stage 3 unspecified: Secondary | ICD-10-CM | POA: Diagnosis not present

## 2020-08-20 DIAGNOSIS — E785 Hyperlipidemia, unspecified: Secondary | ICD-10-CM | POA: Diagnosis not present

## 2020-08-20 DIAGNOSIS — G894 Chronic pain syndrome: Secondary | ICD-10-CM | POA: Diagnosis not present

## 2020-08-20 DIAGNOSIS — E08610 Diabetes mellitus due to underlying condition with diabetic neuropathic arthropathy: Secondary | ICD-10-CM | POA: Diagnosis not present

## 2020-08-20 DIAGNOSIS — I129 Hypertensive chronic kidney disease with stage 1 through stage 4 chronic kidney disease, or unspecified chronic kidney disease: Secondary | ICD-10-CM | POA: Diagnosis not present

## 2020-08-20 DIAGNOSIS — E1140 Type 2 diabetes mellitus with diabetic neuropathy, unspecified: Secondary | ICD-10-CM | POA: Diagnosis not present

## 2020-08-20 DIAGNOSIS — M542 Cervicalgia: Secondary | ICD-10-CM | POA: Diagnosis not present

## 2020-08-20 DIAGNOSIS — M5432 Sciatica, left side: Secondary | ICD-10-CM | POA: Diagnosis not present

## 2020-08-20 DIAGNOSIS — E1121 Type 2 diabetes mellitus with diabetic nephropathy: Secondary | ICD-10-CM | POA: Diagnosis not present

## 2020-08-20 DIAGNOSIS — M109 Gout, unspecified: Secondary | ICD-10-CM | POA: Diagnosis not present

## 2020-08-23 ENCOUNTER — Other Ambulatory Visit: Payer: Self-pay | Admitting: Family Medicine

## 2020-08-23 DIAGNOSIS — M542 Cervicalgia: Secondary | ICD-10-CM

## 2020-08-23 DIAGNOSIS — M5432 Sciatica, left side: Secondary | ICD-10-CM

## 2020-09-10 ENCOUNTER — Ambulatory Visit
Admission: RE | Admit: 2020-09-10 | Discharge: 2020-09-10 | Disposition: A | Discharge status: Home / Self Care | Payer: Medicare Other | Source: Ambulatory Visit | Attending: Family Medicine | Admitting: Family Medicine

## 2020-09-10 ENCOUNTER — Other Ambulatory Visit: Payer: Self-pay

## 2020-09-10 DIAGNOSIS — M542 Cervicalgia: Secondary | ICD-10-CM

## 2020-09-10 DIAGNOSIS — M5432 Sciatica, left side: Secondary | ICD-10-CM

## 2020-09-10 DIAGNOSIS — M545 Low back pain, unspecified: Secondary | ICD-10-CM | POA: Diagnosis not present

## 2020-09-10 DIAGNOSIS — M4802 Spinal stenosis, cervical region: Secondary | ICD-10-CM | POA: Diagnosis not present

## 2020-09-10 DIAGNOSIS — M48061 Spinal stenosis, lumbar region without neurogenic claudication: Secondary | ICD-10-CM | POA: Diagnosis not present

## 2020-09-13 ENCOUNTER — Other Ambulatory Visit: Payer: Self-pay

## 2020-09-13 ENCOUNTER — Encounter (INDEPENDENT_AMBULATORY_CARE_PROVIDER_SITE_OTHER): Payer: Medicare Other | Admitting: Ophthalmology

## 2020-09-13 DIAGNOSIS — H338 Other retinal detachments: Secondary | ICD-10-CM

## 2020-09-13 DIAGNOSIS — E113311 Type 2 diabetes mellitus with moderate nonproliferative diabetic retinopathy with macular edema, right eye: Secondary | ICD-10-CM | POA: Diagnosis not present

## 2020-09-13 DIAGNOSIS — E113512 Type 2 diabetes mellitus with proliferative diabetic retinopathy with macular edema, left eye: Secondary | ICD-10-CM | POA: Diagnosis not present

## 2020-09-13 DIAGNOSIS — N1832 Chronic kidney disease, stage 3b: Secondary | ICD-10-CM | POA: Diagnosis not present

## 2020-09-13 DIAGNOSIS — Z794 Long term (current) use of insulin: Secondary | ICD-10-CM | POA: Diagnosis not present

## 2020-09-13 DIAGNOSIS — H43812 Vitreous degeneration, left eye: Secondary | ICD-10-CM | POA: Diagnosis not present

## 2020-09-13 DIAGNOSIS — H35033 Hypertensive retinopathy, bilateral: Secondary | ICD-10-CM | POA: Diagnosis not present

## 2020-09-13 DIAGNOSIS — I10 Essential (primary) hypertension: Secondary | ICD-10-CM

## 2020-09-13 DIAGNOSIS — E782 Mixed hyperlipidemia: Secondary | ICD-10-CM | POA: Diagnosis not present

## 2020-09-13 DIAGNOSIS — E1165 Type 2 diabetes mellitus with hyperglycemia: Secondary | ICD-10-CM | POA: Diagnosis not present

## 2020-09-24 DIAGNOSIS — D649 Anemia, unspecified: Secondary | ICD-10-CM | POA: Diagnosis not present

## 2020-09-24 DIAGNOSIS — E782 Mixed hyperlipidemia: Secondary | ICD-10-CM | POA: Diagnosis not present

## 2020-09-24 DIAGNOSIS — R600 Localized edema: Secondary | ICD-10-CM | POA: Diagnosis not present

## 2020-09-24 DIAGNOSIS — M8580 Other specified disorders of bone density and structure, unspecified site: Secondary | ICD-10-CM | POA: Diagnosis not present

## 2020-09-24 DIAGNOSIS — N189 Chronic kidney disease, unspecified: Secondary | ICD-10-CM | POA: Diagnosis not present

## 2020-09-24 DIAGNOSIS — E1140 Type 2 diabetes mellitus with diabetic neuropathy, unspecified: Secondary | ICD-10-CM | POA: Diagnosis not present

## 2020-09-24 DIAGNOSIS — E11610 Type 2 diabetes mellitus with diabetic neuropathic arthropathy: Secondary | ICD-10-CM | POA: Diagnosis not present

## 2020-09-24 DIAGNOSIS — E875 Hyperkalemia: Secondary | ICD-10-CM | POA: Diagnosis not present

## 2020-09-24 DIAGNOSIS — E1165 Type 2 diabetes mellitus with hyperglycemia: Secondary | ICD-10-CM | POA: Diagnosis not present

## 2020-09-24 DIAGNOSIS — I129 Hypertensive chronic kidney disease with stage 1 through stage 4 chronic kidney disease, or unspecified chronic kidney disease: Secondary | ICD-10-CM | POA: Diagnosis not present

## 2020-09-24 DIAGNOSIS — E1122 Type 2 diabetes mellitus with diabetic chronic kidney disease: Secondary | ICD-10-CM | POA: Diagnosis not present

## 2020-09-24 DIAGNOSIS — E538 Deficiency of other specified B group vitamins: Secondary | ICD-10-CM | POA: Diagnosis not present

## 2020-09-30 DIAGNOSIS — M5412 Radiculopathy, cervical region: Secondary | ICD-10-CM | POA: Diagnosis not present

## 2020-09-30 DIAGNOSIS — M5416 Radiculopathy, lumbar region: Secondary | ICD-10-CM | POA: Diagnosis not present

## 2020-10-11 ENCOUNTER — Other Ambulatory Visit: Payer: Self-pay

## 2020-10-11 ENCOUNTER — Encounter (INDEPENDENT_AMBULATORY_CARE_PROVIDER_SITE_OTHER): Payer: Medicare Other | Admitting: Ophthalmology

## 2020-10-11 DIAGNOSIS — E113511 Type 2 diabetes mellitus with proliferative diabetic retinopathy with macular edema, right eye: Secondary | ICD-10-CM | POA: Diagnosis not present

## 2020-10-11 DIAGNOSIS — H35033 Hypertensive retinopathy, bilateral: Secondary | ICD-10-CM

## 2020-10-11 DIAGNOSIS — H43812 Vitreous degeneration, left eye: Secondary | ICD-10-CM | POA: Diagnosis not present

## 2020-10-11 DIAGNOSIS — H33302 Unspecified retinal break, left eye: Secondary | ICD-10-CM | POA: Diagnosis not present

## 2020-10-11 DIAGNOSIS — I10 Essential (primary) hypertension: Secondary | ICD-10-CM

## 2020-10-11 DIAGNOSIS — H338 Other retinal detachments: Secondary | ICD-10-CM

## 2020-10-11 DIAGNOSIS — E113312 Type 2 diabetes mellitus with moderate nonproliferative diabetic retinopathy with macular edema, left eye: Secondary | ICD-10-CM | POA: Diagnosis not present

## 2020-10-15 ENCOUNTER — Telehealth: Payer: Self-pay | Admitting: Podiatry

## 2020-10-15 ENCOUNTER — Encounter: Payer: Self-pay | Admitting: Podiatry

## 2020-10-15 NOTE — Telephone Encounter (Signed)
Called patient lvm to reschedule 6/24 appt also sent letter

## 2020-11-09 DIAGNOSIS — M109 Gout, unspecified: Secondary | ICD-10-CM | POA: Diagnosis not present

## 2020-11-09 DIAGNOSIS — L03011 Cellulitis of right finger: Secondary | ICD-10-CM | POA: Diagnosis not present

## 2020-11-12 ENCOUNTER — Other Ambulatory Visit: Payer: Self-pay

## 2020-11-12 ENCOUNTER — Encounter (INDEPENDENT_AMBULATORY_CARE_PROVIDER_SITE_OTHER): Payer: Medicare Other | Admitting: Ophthalmology

## 2020-11-12 DIAGNOSIS — H35033 Hypertensive retinopathy, bilateral: Secondary | ICD-10-CM

## 2020-11-12 DIAGNOSIS — H43812 Vitreous degeneration, left eye: Secondary | ICD-10-CM

## 2020-11-12 DIAGNOSIS — I10 Essential (primary) hypertension: Secondary | ICD-10-CM | POA: Diagnosis not present

## 2020-11-12 DIAGNOSIS — H338 Other retinal detachments: Secondary | ICD-10-CM | POA: Diagnosis not present

## 2020-11-12 DIAGNOSIS — E113511 Type 2 diabetes mellitus with proliferative diabetic retinopathy with macular edema, right eye: Secondary | ICD-10-CM | POA: Diagnosis not present

## 2020-11-12 DIAGNOSIS — E113312 Type 2 diabetes mellitus with moderate nonproliferative diabetic retinopathy with macular edema, left eye: Secondary | ICD-10-CM | POA: Diagnosis not present

## 2020-11-12 DIAGNOSIS — H33302 Unspecified retinal break, left eye: Secondary | ICD-10-CM | POA: Diagnosis not present

## 2020-11-29 DIAGNOSIS — M109 Gout, unspecified: Secondary | ICD-10-CM | POA: Diagnosis not present

## 2020-11-29 DIAGNOSIS — M7989 Other specified soft tissue disorders: Secondary | ICD-10-CM | POA: Diagnosis not present

## 2020-12-03 ENCOUNTER — Ambulatory Visit: Payer: Medicare Other | Admitting: Podiatry

## 2020-12-03 DIAGNOSIS — Z961 Presence of intraocular lens: Secondary | ICD-10-CM | POA: Diagnosis not present

## 2020-12-03 DIAGNOSIS — H40022 Open angle with borderline findings, high risk, left eye: Secondary | ICD-10-CM | POA: Diagnosis not present

## 2020-12-03 DIAGNOSIS — H401113 Primary open-angle glaucoma, right eye, severe stage: Secondary | ICD-10-CM | POA: Diagnosis not present

## 2020-12-08 ENCOUNTER — Other Ambulatory Visit: Payer: Self-pay

## 2020-12-08 ENCOUNTER — Ambulatory Visit (INDEPENDENT_AMBULATORY_CARE_PROVIDER_SITE_OTHER): Payer: Medicare Other | Admitting: Orthopaedic Surgery

## 2020-12-08 ENCOUNTER — Encounter: Payer: Self-pay | Admitting: Podiatry

## 2020-12-08 ENCOUNTER — Encounter: Payer: Self-pay | Admitting: Orthopaedic Surgery

## 2020-12-08 ENCOUNTER — Ambulatory Visit (INDEPENDENT_AMBULATORY_CARE_PROVIDER_SITE_OTHER): Payer: Medicare Other | Admitting: Podiatry

## 2020-12-08 ENCOUNTER — Ambulatory Visit: Payer: Self-pay

## 2020-12-08 DIAGNOSIS — M7989 Other specified soft tissue disorders: Secondary | ICD-10-CM

## 2020-12-08 DIAGNOSIS — M79675 Pain in left toe(s): Secondary | ICD-10-CM

## 2020-12-08 DIAGNOSIS — B351 Tinea unguium: Secondary | ICD-10-CM

## 2020-12-08 DIAGNOSIS — E1142 Type 2 diabetes mellitus with diabetic polyneuropathy: Secondary | ICD-10-CM | POA: Diagnosis not present

## 2020-12-08 DIAGNOSIS — M79674 Pain in right toe(s): Secondary | ICD-10-CM

## 2020-12-08 NOTE — Progress Notes (Signed)
  Subjective:  Patient ID: Sabrina Baker, female    DOB: [birth year],  MRN: [id number]  Chief Complaint  Patient presents with   Nail Problem    Nail trim    80 y.o. female returns for the above complaint.  Patient presents with thickened elongated dystrophic toenails x10.  Pain on palpation.  Patient would like for me to debride down which is not able to do it herself.  She denies any other acute complaints.  Objective:  There were no vitals filed for this visit. Podiatric Exam: Vascular: dorsalis pedis and posterior tibial pulses are palpable bilateral. Capillary return is immediate. Temperature gradient is WNL. Skin turgor WNL  Sensorium: Normal Semmes Weinstein monofilament test. Normal tactile sensation bilaterally. Nail Exam: Pt has thick disfigured discolored nails with subungual debris noted bilateral entire nail hallux through fifth toenails.  Pain on palpation to the nails. Ulcer Exam: There is no evidence of ulcer or pre-ulcerative changes or infection. Orthopedic Exam: Muscle tone and strength are WNL. No limitations in general ROM. No crepitus or effusions noted. HAV  B/L.  Hammer toes 2-5  B/L. Skin: No Porokeratosis. No infection or ulcers    Assessment & Plan:   No diagnosis found.   Patient was evaluated and treated and all questions answered.  Hammertoes 2 through 5 bilateral -I explained to the patient the etiology of hammertoe contractures and various treatment options were discussed.  Given the amount of pain she is having I believe patient will benefit from diabetic shoes to prevent ulceration given the contractures.  She will follow-up with Liliane Channel for diabetic shoes.   Onychomycosis with pain  -Nails palliatively debrided as below. -Educated on self-care  Procedure: Nail Debridement Rationale: pain  Type of Debridement: manual, sharp debridement. Instrumentation: Nail nipper, rotary burr. Number of Nails: 10  Procedures and Treatment: Consent by patient  was obtained for treatment procedures. The patient understood the discussion of treatment and procedures well. All questions were answered thoroughly reviewed. Debridement of mycotic and hypertrophic toenails, 1 through 5 bilateral and clearing of subungual debris. No ulceration, no infection noted.  Return Visit-Office Procedure: Patient instructed to return to the office for a follow up visit 3 months for continued evaluation and treatment.  Boneta Lucks, DPM    No follow-ups on file.

## 2020-12-08 NOTE — Addendum Note (Signed)
Addended by: Precious Bard on: 12/08/2020 01:50 PM   Modules accepted: Orders

## 2020-12-08 NOTE — Progress Notes (Signed)
Office Visit Note   Patient: Sabrina Baker           Date of Birth: 09-11-1940           MRN: [id number] Visit Date: 12/08/2020              Requested by: [name], MD [address],  [place] [id number] PCP: [name], MD   Assessment & Plan: Visit Diagnoses:  1. Swelling of thumb, right     Plan: Based on findings my impression is a right thumb gout attack although given history of diabetes she could be experiencing a Charcot joint.  There are erosive changes to the distal portion of the proximal phalanx.  I think the infection is less likely unless she develops a secondary infection.  For now we will continue management for a gout attack.  We will obtain inflammatory and uric acid labs.  Would like to recheck in 2 weeks.  Signs and symptoms of infection were reviewed with the patient which when she would need to go to the ED immediately.  Follow-Up Instructions: Return in about 2 weeks (around 12/22/2020).   Orders:  Orders Placed This Encounter  Procedures   XR Finger Thumb Right   No orders of the defined types were placed in this encounter.     Procedures: No procedures performed   Clinical Data: No additional findings.   Subjective: Chief Complaint  Patient presents with   Right Thumb - Pain    Sabrina Baker comes in today for evaluation of right thumb swelling and redness for about 6 weeks.  Originally [facility] at [facility] and was initially placed on antibiotics for cellulitis.  She did not have much improvement from the antibiotics and with a history of gout it was felt that she was likely suffering from a severe gout attack.  She is actually had a gout attack in the same thumb back in 2020 when she was hospitalized with COVID.  She currently denies any constitutional symptoms.  She has nontoxic-appearing.  She is currently taking prednisone as well as colchicine as needed.  Second round of antibiotics has not improved anything.  Her  pain is relatively mild.   Review of Systems  Constitutional: Negative.   HENT: Negative.    Eyes: Negative.   Respiratory: Negative.    Cardiovascular: Negative.   Endocrine: Negative.   Musculoskeletal: Negative.   Neurological: Negative.   Hematological: Negative.   Psychiatric/Behavioral: Negative.    All other systems reviewed and are negative.   Objective: Vital Signs: There were no vitals taken for this visit.  Physical Exam Vitals and nursing note reviewed.  Constitutional:      Appearance: She is well-developed.  Pulmonary:     Effort: Pulmonary effort is normal.  Skin:    General: Skin is warm.     Capillary Refill: Capillary refill takes less than 2 seconds.  Neurological:     Mental Status: She is alert and oriented to person, place, and time.  Psychiatric:        Behavior: Behavior normal.        Thought Content: Thought content normal.        Judgment: Judgment normal.    Ortho Exam Right thumb is quite swollen and red.  There is no neurovascular compromise.  Brisk capillary refill.  There is motion through the proximal phalanx but this does not cause pain.  The thumb is radially deviated. Specialty Comments:  No specialty comments available.  Imaging: XR Finger Thumb Right  Result Date: 12/08/2020 Erosion of the distal end of the proximal phalanx.  Pathologic fracture.      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 No pertinent family history.  Past Surgical History:  Procedure Laterality Date   25 GAUGE PARS PLANA VITRECTOMY WITH 20 GAUGE MVR PORT Right 03/16/2015   Procedure: 25 GAUGE PARS PLANA VITRECTOMY WITH 23 GAUGE MVR PORT;  Surgeon: Hayden Pedro, MD;  Location: [place];  Service: Ophthalmology;  Laterality: Right;   ANKLE SURGERY     BREAST EXCISIONAL BIOPSY     BREAST LUMPECTOMY WITH RADIOACTIVE SEED LOCALIZATION Right 01/24/2016   Procedure: RIGHT BREAST LUMPECTOMY WITH RADIOACTIVE SEED LOCALIZATION;  Surgeon: Coralie Keens, MD;  Location: [place];  Service: General;  Laterality: Right;   EYE SURGERY     detached retna X2   HEEL SPUR SURGERY  2012   INCISION AND DRAINAGE ABSCESS Right 11/21/2019   Procedure: INCISION AND DRAINAGE FOREHEAD & RIGHT EAR ABSCESS;  Surgeon: Jerrell Belfast, MD;  Location: [place];  Service: ENT;  Laterality: Right;   INJECTION OF SILICONE OIL Right 37/06/624   Procedure: Extraction OF SILICONE OIL;  Surgeon: Hayden Pedro, MD;  Location: [place];  Service: Ophthalmology;  Laterality: Right;   ORIF ANKLE FRACTURE Left 06/17/2014   Procedure: OPEN REDUCTION INTERNAL FIXATION (ORIF) LEFT BIMALLEOLAR ANKLE FRACTURE;  Surgeon: Marianna Payment, MD;  Location: [place];  Service: Orthopedics;  Laterality: Left;   PHOTOCOAGULATION WITH LASER Right 03/16/2015   Procedure: PHOTOCOAGULATION WITH LASER;  Surgeon: Hayden Pedro, MD;  Location: [place];  Service: Ophthalmology;  Laterality: Right;   SHOULDER ARTHROSCOPY Right    plates and screws   VITRECTOMY Right 03/16/2015    WRIST ARTHROPLASTY Left    plates and screws and bone graft   Social History   Occupational History   Not on file  Tobacco Use   Smoking status: Never   Smokeless tobacco: Never  Vaping Use   Vaping Use: Never used  Substance and Sexual Activity   Alcohol use: No   Drug use: No   Sexual activity: Never    Birth control/protection: Post-menopausal

## 2020-12-09 LAB — CBC WITH DIFFERENTIAL/PLATELET
Absolute Monocytes: 1032 cells/uL — ABNORMAL HIGH (ref 200–950)
Basophils Absolute: 15 cells/uL (ref 0–200)
Basophils Relative: 0.1 %
Eosinophils Absolute: 15 cells/uL (ref 15–500)
Eosinophils Relative: 0.1 %
HCT: 41.3 % (ref 35.0–45.0)
Hemoglobin: 13.3 g/dL (ref 11.7–15.5)
Lymphs Abs: 1863 cells/uL (ref 850–3900)
MCH: 29.4 pg (ref 27.0–33.0)
MCHC: 32.2 g/dL (ref 32.0–36.0)
MCV: 91.2 fL (ref 80.0–100.0)
MPV: 10 fL (ref 7.5–12.5)
Monocytes Relative: 6.7 %
Neutro Abs: 12474 cells/uL — ABNORMAL HIGH (ref 1500–7800)
Neutrophils Relative %: 81 %
Platelets: 350 10*3/uL (ref 140–400)
RBC: 4.53 10*6/uL (ref 3.80–5.10)
RDW: 14 % (ref 11.0–15.0)
Total Lymphocyte: 12.1 %
WBC: 15.4 10*3/uL — ABNORMAL HIGH (ref 3.8–10.8)

## 2020-12-09 LAB — URIC ACID: Uric Acid, Serum: 11.2 mg/dL — ABNORMAL HIGH (ref 2.5–7.0)

## 2020-12-09 LAB — SEDIMENTATION RATE: Sed Rate: 29 mm/h (ref 0–30)

## 2020-12-09 LAB — C-REACTIVE PROTEIN: CRP: 3.5 mg/L (ref <8.0)

## 2020-12-09 NOTE — Progress Notes (Signed)
Please let her know that the lab work shows that it is gout.

## 2020-12-09 NOTE — Progress Notes (Signed)
Patient aware.

## 2020-12-10 ENCOUNTER — Encounter (INDEPENDENT_AMBULATORY_CARE_PROVIDER_SITE_OTHER): Payer: Medicare Other | Admitting: Ophthalmology

## 2020-12-10 ENCOUNTER — Other Ambulatory Visit: Payer: Self-pay

## 2020-12-10 DIAGNOSIS — H338 Other retinal detachments: Secondary | ICD-10-CM | POA: Diagnosis not present

## 2020-12-10 DIAGNOSIS — I10 Essential (primary) hypertension: Secondary | ICD-10-CM

## 2020-12-10 DIAGNOSIS — H33302 Unspecified retinal break, left eye: Secondary | ICD-10-CM | POA: Diagnosis not present

## 2020-12-10 DIAGNOSIS — E113312 Type 2 diabetes mellitus with moderate nonproliferative diabetic retinopathy with macular edema, left eye: Secondary | ICD-10-CM

## 2020-12-10 DIAGNOSIS — H35033 Hypertensive retinopathy, bilateral: Secondary | ICD-10-CM | POA: Diagnosis not present

## 2020-12-10 DIAGNOSIS — E113511 Type 2 diabetes mellitus with proliferative diabetic retinopathy with macular edema, right eye: Secondary | ICD-10-CM

## 2020-12-10 DIAGNOSIS — H43812 Vitreous degeneration, left eye: Secondary | ICD-10-CM | POA: Diagnosis not present

## 2020-12-10 DIAGNOSIS — H353122 Nonexudative age-related macular degeneration, left eye, intermediate dry stage: Secondary | ICD-10-CM | POA: Diagnosis not present

## 2020-12-15 DIAGNOSIS — M1A9XX1 Chronic gout, unspecified, with tophus (tophi): Secondary | ICD-10-CM | POA: Diagnosis not present

## 2020-12-28 ENCOUNTER — Ambulatory Visit (INDEPENDENT_AMBULATORY_CARE_PROVIDER_SITE_OTHER): Payer: Medicare Other | Admitting: Orthopaedic Surgery

## 2020-12-28 ENCOUNTER — Other Ambulatory Visit: Payer: Self-pay

## 2020-12-28 ENCOUNTER — Encounter: Payer: Self-pay | Admitting: Orthopaedic Surgery

## 2020-12-28 VITALS — Ht 63.0 in | Wt 169.0 lb

## 2020-12-28 DIAGNOSIS — M7989 Other specified soft tissue disorders: Secondary | ICD-10-CM | POA: Diagnosis not present

## 2020-12-28 NOTE — Progress Notes (Signed)
Patient: Sabrina Baker           Date of Birth: 03-Sep-1940           MRN: [id number] Visit Date: 12/28/2020 PCP: [name], MD   Assessment & Plan:  Chief Complaint:  Chief Complaint  Patient presents with   Right Hand - Follow-up    Right thumb   Visit Diagnoses:  1. Swelling of thumb, right     Plan: Manette is coming in today for recheck of her right thumb.  She states that there has been improvement.  She has an appointment with Dr. Benjamine Mola in August.  She has finished antibiotics and was given another round of prednisone by PCP.  Right thumb does show improvement in terms of swelling or redness.  There is no evidence of infection.  No neurovascular compromise.  Findings are consistent with improving gout flareup of the right.  This is consistent with tophaceous gout.  From my standpoint she is improving appropriately and we will just have her come back to see [place] as needed.  Follow-Up Instructions: Return if symptoms worsen or fail to improve.   Orders:  No orders of the defined types were placed in this encounter.  No orders of the defined types were placed in this encounter.   Imaging: No results found.  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 No pertinent family history.  Past Surgical History:  Procedure Laterality Date   25 GAUGE PARS PLANA VITRECTOMY WITH 20 GAUGE MVR PORT Right 03/16/2015   Procedure: 25 GAUGE PARS PLANA VITRECTOMY WITH 23 GAUGE MVR PORT;  Surgeon: Hayden Pedro, MD;  Location: [facility] [place];  Service: Ophthalmology;  Laterality: Right;   ANKLE SURGERY     BREAST EXCISIONAL BIOPSY     BREAST LUMPECTOMY WITH RADIOACTIVE SEED LOCALIZATION Right 01/24/2016   Procedure: RIGHT BREAST LUMPECTOMY WITH RADIOACTIVE SEED LOCALIZATION;  Surgeon: Coralie Keens, MD;  Location: [place];  Service: General;  Laterality: Right;   EYE SURGERY     detached retna X2   HEEL SPUR SURGERY  2012   INCISION AND DRAINAGE ABSCESS Right 11/21/2019   Procedure: INCISION AND DRAINAGE FOREHEAD & RIGHT EAR ABSCESS;  Surgeon: Jerrell Belfast, MD;  Location: [place];  Service: ENT;  Laterality: Right;   INJECTION OF SILICONE OIL Right 58/10/2776   Procedure: Extraction OF SILICONE OIL;  Surgeon: Hayden Pedro, MD;  Location: [facility];  Service: Ophthalmology;  Laterality: Right;   ORIF ANKLE FRACTURE  Left 06/17/2014   Procedure: OPEN REDUCTION INTERNAL FIXATION (ORIF) LEFT BIMALLEOLAR ANKLE FRACTURE;  Surgeon: Marianna Payment, MD;  Location: [place];  Service: Orthopedics;  Laterality: Left;   PHOTOCOAGULATION WITH LASER Right 03/16/2015   Procedure: PHOTOCOAGULATION WITH LASER;  Surgeon: Hayden Pedro, MD;  Location: [place];  Service: Ophthalmology;  Laterality: Right;   SHOULDER ARTHROSCOPY Right    plates and screws   VITRECTOMY Right 03/16/2015   WRIST ARTHROPLASTY Left    plates and screws and bone graft   Social History   Occupational History   Not on file  Tobacco Use   Smoking status: Never   Smokeless tobacco:  Never  Vaping Use   Vaping Use: Never used  Substance and Sexual Activity   Alcohol use: No   Drug use: No   Sexual activity: Never    Birth control/protection: Post-menopausal

## 2020-12-29 DIAGNOSIS — E782 Mixed hyperlipidemia: Secondary | ICD-10-CM | POA: Diagnosis not present

## 2020-12-29 DIAGNOSIS — Z794 Long term (current) use of insulin: Secondary | ICD-10-CM | POA: Diagnosis not present

## 2020-12-29 DIAGNOSIS — I10 Essential (primary) hypertension: Secondary | ICD-10-CM | POA: Diagnosis not present

## 2020-12-29 DIAGNOSIS — N1832 Chronic kidney disease, stage 3b: Secondary | ICD-10-CM | POA: Diagnosis not present

## 2020-12-29 DIAGNOSIS — E1165 Type 2 diabetes mellitus with hyperglycemia: Secondary | ICD-10-CM | POA: Diagnosis not present

## 2020-12-31 DIAGNOSIS — N189 Chronic kidney disease, unspecified: Secondary | ICD-10-CM | POA: Diagnosis not present

## 2020-12-31 DIAGNOSIS — E1142 Type 2 diabetes mellitus with diabetic polyneuropathy: Secondary | ICD-10-CM | POA: Diagnosis not present

## 2020-12-31 DIAGNOSIS — E1122 Type 2 diabetes mellitus with diabetic chronic kidney disease: Secondary | ICD-10-CM | POA: Diagnosis not present

## 2020-12-31 DIAGNOSIS — Z794 Long term (current) use of insulin: Secondary | ICD-10-CM | POA: Diagnosis not present

## 2020-12-31 DIAGNOSIS — E1165 Type 2 diabetes mellitus with hyperglycemia: Secondary | ICD-10-CM | POA: Diagnosis not present

## 2021-01-07 ENCOUNTER — Encounter (INDEPENDENT_AMBULATORY_CARE_PROVIDER_SITE_OTHER): Payer: Medicare Other | Admitting: Ophthalmology

## 2021-01-07 ENCOUNTER — Other Ambulatory Visit: Payer: Self-pay

## 2021-01-07 DIAGNOSIS — H338 Other retinal detachments: Secondary | ICD-10-CM | POA: Diagnosis not present

## 2021-01-07 DIAGNOSIS — I10 Essential (primary) hypertension: Secondary | ICD-10-CM

## 2021-01-07 DIAGNOSIS — H353122 Nonexudative age-related macular degeneration, left eye, intermediate dry stage: Secondary | ICD-10-CM

## 2021-01-07 DIAGNOSIS — E113312 Type 2 diabetes mellitus with moderate nonproliferative diabetic retinopathy with macular edema, left eye: Secondary | ICD-10-CM

## 2021-01-07 DIAGNOSIS — H35371 Puckering of macula, right eye: Secondary | ICD-10-CM

## 2021-01-07 DIAGNOSIS — H35033 Hypertensive retinopathy, bilateral: Secondary | ICD-10-CM

## 2021-01-07 DIAGNOSIS — H43813 Vitreous degeneration, bilateral: Secondary | ICD-10-CM | POA: Diagnosis not present

## 2021-01-07 DIAGNOSIS — E113511 Type 2 diabetes mellitus with proliferative diabetic retinopathy with macular edema, right eye: Secondary | ICD-10-CM | POA: Diagnosis not present

## 2021-01-10 DIAGNOSIS — D631 Anemia in chronic kidney disease: Secondary | ICD-10-CM | POA: Diagnosis not present

## 2021-01-10 DIAGNOSIS — N2581 Secondary hyperparathyroidism of renal origin: Secondary | ICD-10-CM | POA: Diagnosis not present

## 2021-01-10 DIAGNOSIS — N1832 Chronic kidney disease, stage 3b: Secondary | ICD-10-CM | POA: Diagnosis not present

## 2021-01-10 DIAGNOSIS — N390 Urinary tract infection, site not specified: Secondary | ICD-10-CM | POA: Diagnosis not present

## 2021-01-10 DIAGNOSIS — E1122 Type 2 diabetes mellitus with diabetic chronic kidney disease: Secondary | ICD-10-CM | POA: Diagnosis not present

## 2021-01-10 DIAGNOSIS — I129 Hypertensive chronic kidney disease with stage 1 through stage 4 chronic kidney disease, or unspecified chronic kidney disease: Secondary | ICD-10-CM | POA: Diagnosis not present

## 2021-01-10 DIAGNOSIS — M1A9XX1 Chronic gout, unspecified, with tophus (tophi): Secondary | ICD-10-CM | POA: Diagnosis not present

## 2021-01-19 ENCOUNTER — Other Ambulatory Visit: Payer: Self-pay

## 2021-01-19 ENCOUNTER — Ambulatory Visit (INDEPENDENT_AMBULATORY_CARE_PROVIDER_SITE_OTHER): Payer: Medicare Other | Admitting: Orthopaedic Surgery

## 2021-01-19 DIAGNOSIS — M1A9XX1 Chronic gout, unspecified, with tophus (tophi): Secondary | ICD-10-CM

## 2021-01-19 DIAGNOSIS — M7989 Other specified soft tissue disorders: Secondary | ICD-10-CM | POA: Diagnosis not present

## 2021-01-19 NOTE — Progress Notes (Signed)
Office Visit Note   Patient: Sabrina Baker           Date of Birth: [birth year]           MRN: [id number] Visit Date: 01/19/2021              Requested by: [name], MD [address],  [place] [id number] PCP: [name], MD   Assessment & Plan: Visit Diagnoses:  1. Tophaceous gout of joint   2. Swelling of thumb, right     Plan: Impression is right thumb tophaceous gout currently not flared up and fairly stable.  At this point, we would like to place her in a supportive brace for the thumb.  We will have her follow-up with Dr. Tempie Donning in the near future to discuss further options.  Call with concerns or questions in the meantime.  Follow-Up Instructions: Return if symptoms worsen or fail to improve.   Orders:  No orders of the defined types were placed in this encounter.  No orders of the defined types were placed in this encounter.     Procedures: No procedures performed   Clinical Data: No additional findings.   Subjective: Chief Complaint  Patient presents with   Right Thumb - Pain     HPI patient is a pleasant 80 year old right-hand-dominant female who comes in today with right thumb swelling.  History of tophaceous gout.  This initially started back in May.  She was on 2 to 3 weeks of antibiotics followed by maintenance dosing of allopurinol and Colcrys by her PCP.  She has had a few flareups since the initial attack in May.  Her most recent attack was about a week ago.  She was started on a steroid taper which she finished this Sunday.  She has minimal pain.  She continues to have swelling and mild erythema.  Review of Systems as detailed in HPI.  All others reviewed and are negative.   Objective: Vital Signs: There were no vitals taken for this visit.  Physical Exam well-developed and well-nourished female in no acute distress.  Alert and oriented x3.  Ortho Exam right thumb exam shows moderate to advanced swelling.  Mild  erythema.  Mild tenderness to the dorsal aspect.  No evidence of gouty tophi.  Limited range of motion secondary to the swelling.  She is neurovascular intact distally.  Specialty Comments:  No specialty comments available.  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  Past Surgical History:  Procedure Laterality Date   25 GAUGE PARS PLANA VITRECTOMY WITH 20 GAUGE MVR PORT Right 03/16/2015   Procedure: 25 GAUGE PARS PLANA VITRECTOMY WITH 23 GAUGE MVR PORT;  Surgeon: Hayden Pedro, MD;  Location: [place];  Service: Ophthalmology;  Laterality: Right;   ANKLE SURGERY     BREAST EXCISIONAL BIOPSY     BREAST LUMPECTOMY WITH RADIOACTIVE SEED LOCALIZATION Right 01/24/2016   Procedure: RIGHT BREAST LUMPECTOMY WITH RADIOACTIVE SEED LOCALIZATION;  Surgeon: Coralie Keens, MD;  Location: [place];  Service: General;  Laterality: Right;   EYE SURGERY     detached retna X2   HEEL SPUR SURGERY  2012   INCISION AND DRAINAGE ABSCESS  Right 11/21/2019   Procedure: INCISION AND DRAINAGE FOREHEAD & RIGHT EAR ABSCESS;  Surgeon: Jerrell Belfast, MD;  Location: [place];  Service: ENT;  Laterality: Right;   INJECTION OF SILICONE OIL Right [id number]   Procedure: Extraction OF SILICONE OIL;  Surgeon: Hayden Pedro, MD;  Location: [place];  Service: Ophthalmology;  Laterality: Right;   ORIF ANKLE FRACTURE Left 06/17/2014   Procedure: OPEN REDUCTION INTERNAL FIXATION (ORIF) LEFT BIMALLEOLAR ANKLE FRACTURE;  Surgeon: Marianna Payment, MD;  Location: [place];  Service: Orthopedics;  Laterality: Left;   PHOTOCOAGULATION WITH LASER Right 03/16/2015   Procedure: PHOTOCOAGULATION WITH LASER;  Surgeon: Hayden Pedro, MD;  Location: [place];  Service: Ophthalmology;  Laterality: Right;   SHOULDER ARTHROSCOPY Right    plates and screws   VITRECTOMY Right 03/16/2015   WRIST ARTHROPLASTY Left    plates and screws and bone graft   Social History   Occupational History   Not on file  Tobacco Use   Smoking status: Never   Smokeless tobacco: Never  Vaping Use   Vaping Use: Never used  Substance and Sexual Activity   Alcohol use: No   Drug use: No   Sexual activity: Never    Birth control/protection: Post-menopausal

## 2021-01-20 NOTE — Progress Notes (Signed)
Office Visit Note  Patient: Sabrina Baker             Date of Birth: Dec 31, 1940           MRN: [id number]             PCP: [name], MD Referring: [name], MD Visit Date: 01/21/2021  Subjective:  New Patient (Initial Visit) (Gout, right thumb pain and swelling)   History of Present Illness: Sabrina Baker is a 80 y.o. female here for erosive tophaceous gout.  She has longstanding but very infrequent history of gout limited to her lower extremity in the past.  In 2020 when hospitalized with COVID illness she experienced acute gouty arthritis of the right thumb with severe pain and swelling.  This had subsided with no persistent symptoms but she redeveloped right thumb pain and swelling worsening in May.  She does not recall any critical illness associated with triggering this new flare.  Outside of the right thumb she is not experiencing any other particularly swollen red or abnormally painful sites.  Hand was evaluated by orthopedics clinic with Dr. Erlinda Hong findings felt to represent gouty arthritis x-ray showing severe erosive changes of the head of proximal phalanx and around the interphalangeal joint.  Lab findings show elevated white blood cell count of 15.4, uric acid of 11.2, mild and impairment of GFR on July labs.  She was started on allopurinol at 100 mg daily dose.  Labs reviewed 12/2020 CMP eGFR 1.51  11/2020 CBC WBC 15.4 Uric acid 11.2  Imaging reviewed 12/08/20 Xray right thumb Erosion of the 1st IP joint, cystic changes around MCP joint   Activities of Daily Living:  Patient reports morning stiffness for 0  none .   Patient Denies nocturnal pain.  Difficulty dressing/grooming: Denies Difficulty climbing stairs: Reports Difficulty getting out of chair: Denies Difficulty using hands for taps, buttons, cutlery, and/or writing: Reports  Review of Systems  Constitutional:  Positive for fatigue.  HENT:  Positive for mouth dryness.   Eyes:  Positive for dryness.   Respiratory:  Negative for shortness of breath.   Cardiovascular:  Negative for swelling in legs/feet.  Gastrointestinal:  Negative for constipation.  Endocrine: Negative for excessive thirst.  Genitourinary:  Negative for difficulty urinating.  Musculoskeletal:  Positive for joint pain, gait problem, joint pain, joint swelling and muscle weakness.  Skin:  Negative for rash.  Allergic/Immunologic: Negative for susceptible to infections.  Neurological:  Positive for weakness.  Hematological:  Positive for bruising/bleeding tendency.  Psychiatric/Behavioral:  Negative for sleep disturbance.    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 No pertinent family history. Past Surgical History:  Procedure Laterality Date   25 GAUGE PARS PLANA VITRECTOMY WITH 20 GAUGE MVR PORT Right 03/16/2015   Procedure: 25 GAUGE PARS PLANA VITRECTOMY WITH 23 GAUGE MVR PORT;  Surgeon: Hayden Pedro, MD;  Location: [facility];  Service: Ophthalmology;   Laterality: Right;   ANKLE SURGERY     BREAST EXCISIONAL BIOPSY     BREAST LUMPECTOMY WITH RADIOACTIVE SEED LOCALIZATION Right 01/24/2016   Procedure: RIGHT BREAST LUMPECTOMY WITH RADIOACTIVE SEED LOCALIZATION;  Surgeon: Coralie Keens, MD;  Location: [facility];  Service: General;  Laterality: Right;   EYE SURGERY     detached retna X2   HEEL SPUR SURGERY  2012   INCISION AND DRAINAGE ABSCESS Right 11/21/2019   Procedure: INCISION AND DRAINAGE FOREHEAD & RIGHT EAR ABSCESS;  Surgeon: Jerrell Belfast, MD;  Location: [facility];  Service: ENT;  Laterality: Right;   INJECTION OF SILICONE OIL Right 62/0/3559   Procedure: Extraction OF SILICONE OIL;  Surgeon: Hayden Pedro, MD;  Location: [facility];  Service: Ophthalmology;  Laterality: Right;   ORIF ANKLE FRACTURE Left 06/17/2014   Procedure: OPEN REDUCTION INTERNAL FIXATION (ORIF) LEFT BIMALLEOLAR ANKLE FRACTURE;  Surgeon: Marianna Payment, MD;  Location: [facility];  Service: Orthopedics;  Laterality: Left;   PHOTOCOAGULATION WITH LASER Right 03/16/2015   Procedure: PHOTOCOAGULATION WITH LASER;  Surgeon: Hayden Pedro, MD;  Location: [facility];  Service: Ophthalmology;  Laterality: Right;   SHOULDER ARTHROSCOPY Right    plates and screws   VITRECTOMY Right 03/16/2015   WRIST ARTHROPLASTY Left    plates and screws and bone graft   Social History   Social History Narrative   Not on file   Immunization History  Administered Date(s) Administered   Influenza, High Dose Seasonal PF 03/11/2018   Influenza-Unspecified 03/12/2015   Moderna SARS-COV2 Booster Vaccination 05/11/2020, 11/23/2020   Moderna Sars-Covid-2 Vaccination 08/07/2019, 09/04/2019   Zoster Recombinat (Shingrix) 06/21/2017, 06/21/2017     Objective: Vital Signs: BP 124/64 (BP Location: Left Arm, Patient Position: Sitting, Cuff Size: Normal)   Pulse 71   Resp 16   Ht 5' 1.75" (1.568 m)   Wt 160 lb (72.6 kg)   BMI 29.50 kg/m    Physical Exam Cardiovascular:      Rate and Rhythm: Normal rate and regular rhythm.  Pulmonary:     Effort: Pulmonary effort is normal.     Breath sounds: Normal breath sounds.  Skin:    General: Skin is warm and dry.  Neurological:     Mental Status: She is alert.  Psychiatric:        Mood and Affect: Mood normal.     Musculoskeletal Exam:  Shoulders full ROM no tenderness or swelling Elbows full ROM no tenderness or swelling Wrists full ROM no tenderness or swelling Mild Heberden's nodes present on bilateral hands, right thumb with severe soft tissue swelling over interphalangeal joint with decreased range of motion only mildly tender and mildly erythematous, limited ultrasound inspection showing multiple probable tophaceous deposits synovial hypertrophy and soft tissue thickening without large joint effusion Knees full ROM no tenderness or swelling Ankles full ROM no tenderness or swelling No MTP joint swelling   Investigation: No additional findings.  Imaging: No results found.  Recent Labs: Lab Results  Component Value Date  WBC 15.4 (H) 12/08/2020   HGB 13.3 12/08/2020   PLT 350 12/08/2020   NA 141 11/25/2019   K 4.0 11/25/2019   CL 103 11/25/2019   CO2 27 11/25/2019   GLUCOSE 127 (H) 11/25/2019   BUN 15 11/25/2019   CREATININE 1.27 (H) 11/25/2019   BILITOT 0.5 11/19/2019   ALKPHOS 66 11/19/2019   AST 15 11/19/2019   ALT 14 11/19/2019   PROT 6.6 11/19/2019   ALBUMIN 2.9 (L) 11/19/2019   CALCIUM 8.7 (L) 11/25/2019   GFRAA 47 (L) 11/25/2019    Speciality Comments: No specialty comments available.  Procedures:  No procedures performed Allergies: Ramipril, Other, Cortisone, Niacin, Niacin and related, and Valsartan   Assessment / Plan:     Visit Diagnoses: Tophaceous gout of joint - Plan: Uric acid, COMPLETE METABOLIC PANEL WITH GFR  Appears consistent with erosive tophaceous gout affecting the right thumb likely contributors of chronic renal insufficiency and critical illness also now  with existing damage to the site.  Instructed to continue the allopurinol 100 mg daily and to take the colchicine 0.6 mg tablet.  Risk of ongoing inflammation or flareups is high at this time could perform intra-articular steroid injection if needed but no particular effusion amenable to aspiration was seen on ultrasound.  We will place standing order for repeat uric acid and CMP later this month to check at 4 weeks of allopurinol treatment anticipate titrating dose up to 200 mg daily.  Orders: Orders Placed This Encounter  Procedures   Uric acid   COMPLETE METABOLIC PANEL WITH GFR    No orders of the defined types were placed in this encounter.    Follow-Up Instructions: Return in about 7 weeks (around 03/11/2021) for New pt erosive gout allopurinol f/u 7wks.   Collier Salina, MD  Note - This record has been created using [employer].  Chart creation errors have been sought, but may not always  have been located. Such creation errors do not reflect on  the standard of medical care.

## 2021-01-21 ENCOUNTER — Ambulatory Visit (INDEPENDENT_AMBULATORY_CARE_PROVIDER_SITE_OTHER): Payer: Medicare Other | Admitting: Internal Medicine

## 2021-01-21 ENCOUNTER — Other Ambulatory Visit: Payer: Self-pay

## 2021-01-21 ENCOUNTER — Encounter: Payer: Self-pay | Admitting: Internal Medicine

## 2021-01-21 VITALS — BP 124/64 | HR 71 | Resp 16 | Ht 61.75 in | Wt 160.0 lb

## 2021-01-21 DIAGNOSIS — M1A9XX1 Chronic gout, unspecified, with tophus (tophi): Secondary | ICD-10-CM

## 2021-01-21 NOTE — Patient Instructions (Signed)
I recommend continuing the colchicine and allopurinol 1 tablet daily. You can come by for labs only visit on 8/29 and if can let you know about adjusting the dose.

## 2021-01-28 DIAGNOSIS — Z0000 Encounter for general adult medical examination without abnormal findings: Secondary | ICD-10-CM | POA: Diagnosis not present

## 2021-01-28 DIAGNOSIS — Z1159 Encounter for screening for other viral diseases: Secondary | ICD-10-CM | POA: Diagnosis not present

## 2021-01-28 DIAGNOSIS — D6869 Other thrombophilia: Secondary | ICD-10-CM | POA: Diagnosis not present

## 2021-01-28 DIAGNOSIS — K219 Gastro-esophageal reflux disease without esophagitis: Secondary | ICD-10-CM | POA: Diagnosis not present

## 2021-01-28 DIAGNOSIS — N289 Disorder of kidney and ureter, unspecified: Secondary | ICD-10-CM | POA: Diagnosis not present

## 2021-01-28 DIAGNOSIS — N183 Chronic kidney disease, stage 3 unspecified: Secondary | ICD-10-CM | POA: Diagnosis not present

## 2021-01-28 DIAGNOSIS — M109 Gout, unspecified: Secondary | ICD-10-CM | POA: Diagnosis not present

## 2021-01-28 DIAGNOSIS — G894 Chronic pain syndrome: Secondary | ICD-10-CM | POA: Diagnosis not present

## 2021-01-28 DIAGNOSIS — I129 Hypertensive chronic kidney disease with stage 1 through stage 4 chronic kidney disease, or unspecified chronic kidney disease: Secondary | ICD-10-CM | POA: Diagnosis not present

## 2021-01-28 DIAGNOSIS — I480 Paroxysmal atrial fibrillation: Secondary | ICD-10-CM | POA: Diagnosis not present

## 2021-01-28 DIAGNOSIS — E08610 Diabetes mellitus due to underlying condition with diabetic neuropathic arthropathy: Secondary | ICD-10-CM | POA: Diagnosis not present

## 2021-01-28 DIAGNOSIS — I700 Atherosclerosis of aorta: Secondary | ICD-10-CM | POA: Diagnosis not present

## 2021-01-28 DIAGNOSIS — E785 Hyperlipidemia, unspecified: Secondary | ICD-10-CM | POA: Diagnosis not present

## 2021-01-31 ENCOUNTER — Other Ambulatory Visit: Payer: Self-pay | Admitting: Family Medicine

## 2021-01-31 DIAGNOSIS — Z1231 Encounter for screening mammogram for malignant neoplasm of breast: Secondary | ICD-10-CM

## 2021-02-01 ENCOUNTER — Other Ambulatory Visit: Payer: Self-pay | Admitting: Family Medicine

## 2021-02-01 DIAGNOSIS — N289 Disorder of kidney and ureter, unspecified: Secondary | ICD-10-CM

## 2021-02-07 ENCOUNTER — Encounter (INDEPENDENT_AMBULATORY_CARE_PROVIDER_SITE_OTHER): Payer: Medicare Other | Admitting: Ophthalmology

## 2021-02-07 ENCOUNTER — Other Ambulatory Visit: Payer: Self-pay

## 2021-02-07 ENCOUNTER — Other Ambulatory Visit: Payer: Self-pay | Admitting: Internal Medicine

## 2021-02-07 ENCOUNTER — Other Ambulatory Visit: Payer: Self-pay | Admitting: *Deleted

## 2021-02-07 DIAGNOSIS — H33302 Unspecified retinal break, left eye: Secondary | ICD-10-CM

## 2021-02-07 DIAGNOSIS — H35033 Hypertensive retinopathy, bilateral: Secondary | ICD-10-CM

## 2021-02-07 DIAGNOSIS — M1A9XX1 Chronic gout, unspecified, with tophus (tophi): Secondary | ICD-10-CM

## 2021-02-07 DIAGNOSIS — I10 Essential (primary) hypertension: Secondary | ICD-10-CM | POA: Diagnosis not present

## 2021-02-07 DIAGNOSIS — E113312 Type 2 diabetes mellitus with moderate nonproliferative diabetic retinopathy with macular edema, left eye: Secondary | ICD-10-CM | POA: Diagnosis not present

## 2021-02-07 DIAGNOSIS — H338 Other retinal detachments: Secondary | ICD-10-CM

## 2021-02-07 DIAGNOSIS — E113511 Type 2 diabetes mellitus with proliferative diabetic retinopathy with macular edema, right eye: Secondary | ICD-10-CM | POA: Diagnosis not present

## 2021-02-07 DIAGNOSIS — H43813 Vitreous degeneration, bilateral: Secondary | ICD-10-CM

## 2021-02-08 ENCOUNTER — Telehealth: Payer: Self-pay

## 2021-02-08 LAB — COMPLETE METABOLIC PANEL WITH GFR
AG Ratio: 1.7 (calc) (ref 1.0–2.5)
ALT: 10 U/L (ref 6–29)
AST: 18 U/L (ref 10–35)
Albumin: 3.6 g/dL (ref 3.6–5.1)
Alkaline phosphatase (APISO): 81 U/L (ref 37–153)
BUN/Creatinine Ratio: 17 (calc) (ref 6–22)
BUN: 25 mg/dL (ref 7–25)
CO2: 30 mmol/L (ref 20–32)
Calcium: 9.6 mg/dL (ref 8.6–10.4)
Chloride: 102 mmol/L (ref 98–110)
Creat: 1.49 mg/dL — ABNORMAL HIGH (ref 0.60–1.00)
Globulin: 2.1 g/dL (calc) (ref 1.9–3.7)
Glucose, Bld: 207 mg/dL — ABNORMAL HIGH (ref 65–99)
Potassium: 5 mmol/L (ref 3.5–5.3)
Sodium: 140 mmol/L (ref 135–146)
Total Bilirubin: 0.3 mg/dL (ref 0.2–1.2)
Total Protein: 5.7 g/dL — ABNORMAL LOW (ref 6.1–8.1)
eGFR: 36 mL/min/{1.73_m2} — ABNORMAL LOW (ref >=60)

## 2021-02-08 LAB — URIC ACID: Uric Acid, Serum: 7.9 mg/dL — ABNORMAL HIGH (ref 2.5–7.0)

## 2021-02-08 NOTE — Telephone Encounter (Signed)
Lab results have been prepared and sent to mail to patient.

## 2021-02-08 NOTE — Telephone Encounter (Signed)
Patient called requesting a copy of her labwork results be mailed to her home. [address] [place], [place]  [address]

## 2021-02-08 NOTE — Progress Notes (Signed)
Uric acid improved from 11.2 to 7.9 which is still above the goal amount. I recommend increasing the dose to 2 tablets daily and we can follow up next month. Your kidney function numbers look slightly worse, although my most recent comparison is from last year so I do not know if this is new or whether a long term change we will be repeating it with next blood tests.

## 2021-02-15 ENCOUNTER — Telehealth: Payer: Self-pay | Admitting: Orthopedic Surgery

## 2021-02-15 ENCOUNTER — Ambulatory Visit: Payer: Self-pay

## 2021-02-15 ENCOUNTER — Other Ambulatory Visit: Payer: Self-pay

## 2021-02-15 ENCOUNTER — Ambulatory Visit (INDEPENDENT_AMBULATORY_CARE_PROVIDER_SITE_OTHER): Payer: Medicare Other | Admitting: Orthopedic Surgery

## 2021-02-15 ENCOUNTER — Encounter: Payer: Self-pay | Admitting: Orthopedic Surgery

## 2021-02-15 VITALS — BP 130/75 | HR 78 | Ht 62.5 in | Wt 160.0 lb

## 2021-02-15 DIAGNOSIS — M1A9XX1 Chronic gout, unspecified, with tophus (tophi): Secondary | ICD-10-CM | POA: Diagnosis not present

## 2021-02-15 NOTE — Telephone Encounter (Signed)
Pt has an appt today. Pt states on her discharge papers today states she had an x ray of her hand which she did not and want to know if she needs to be seen sooner for an xray. Pt states please call before 2:30 pm for she will be unavailable after that time. Pt phone number is [phone].

## 2021-02-15 NOTE — Telephone Encounter (Signed)
Spoke with patient let her know we didn't need this xray and we removed it from her chart

## 2021-02-15 NOTE — Progress Notes (Signed)
Office Visit Note   Patient: KYONNA BREM           Date of Birth: 08/15/1940           MRN: [id number] Visit Date: 02/15/2021              Requested by: [name], MD [address] [place],  [place] [id number] PCP: [name], MD   Assessment & Plan: Visit Diagnoses:  1. Tophaceous gout of joint     Plan: At this point, we will continue medical management of her gout given her thumb improvement and limited symptoms today.  She will go up on her allopurinol dose as directed from rheumatology.  She can see [place] back in the office in 2 months if she is still having symptoms.  Follow-Up Instructions: No follow-ups on file.   Orders:  No orders of the defined types were placed in this encounter.  No orders of the defined types were placed in this encounter.     Procedures: No procedures performed   Clinical Data: No additional findings.   Subjective: Chief Complaint  Patient presents with   Right Hand - Follow-up    This is a 80 year old right-hand-dominant female who presents with left thumb pain.  Her issues started around May when she developed a red hot swollen left thumb.  She was seen by an outside provider who diagnosed her with cellulitis and put her on 2 different antibiotics.  These did not help.  The thumb gradually improved.  She has a history of gout and has previously taken colchicine as needed.  Her thumb pain has continued to improve.  She is recently been seen by rheumatology and started on allopurinol.  She recently received a letter to increase her dose.  She is able to use this thumb without difficulty despite the stiffness and swelling.   Review of Systems  Constitutional: Negative.   Respiratory: Negative.    Cardiovascular: Negative.   Musculoskeletal:  Positive for joint swelling.  Skin: Negative.   Neurological: Negative.   Psychiatric/Behavioral: Negative.      Objective: Vital Signs: BP 130/75 (BP Location: Left Arm,  Patient Position: Sitting, Cuff Size: Normal)   Pulse 78   Ht 5' 2.5" (1.588 m)   Wt 160 lb (72.6 kg)   SpO2 98%   BMI 28.80 kg/m   Physical Exam Constitutional:      Appearance: Normal appearance.  Cardiovascular:     Rate and Rhythm: Normal rate.     Pulses: Normal pulses.  Pulmonary:     Effort: Pulmonary effort is normal.  Skin:    General: Skin is warm and dry.     Capillary Refill: Capillary refill takes less than 2 seconds.  Neurological:     General: No focal deficit present.     Mental Status: She is alert.    Left Hand Exam   Tenderness  Left hand tenderness location: Non TTP around thumb.   Other  Erythema: absent Scars: absent Sensation: normal Pulse: present     Specialty Comments:  No specialty comments available.  Imaging: 3 views of the left thumb from 12/08/2020 reviewed by me.  They demonstrate erosion of the thumb IP joint with some bone loss consistent with gouty arthritis.   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 No pertinent family history.  Past Surgical History:  Procedure Laterality Date   25 GAUGE PARS PLANA VITRECTOMY WITH 20 GAUGE MVR PORT Right 03/16/2015   Procedure: 25 GAUGE PARS PLANA VITRECTOMY WITH 23 GAUGE MVR PORT;  Surgeon: Hayden Pedro, MD;  Location: [facility];  Service: Ophthalmology;  Laterality: Right;   ANKLE SURGERY     BREAST EXCISIONAL BIOPSY     BREAST LUMPECTOMY WITH RADIOACTIVE SEED LOCALIZATION Right 01/24/2016   Procedure: RIGHT BREAST LUMPECTOMY WITH RADIOACTIVE SEED LOCALIZATION;  Surgeon: Coralie Keens, MD;  Location: [facility];  Service: General;  Laterality: Right;   EYE SURGERY     detached retna X2   HEEL SPUR SURGERY  2012   INCISION AND DRAINAGE ABSCESS Right 11/21/2019   Procedure: INCISION AND DRAINAGE FOREHEAD & RIGHT EAR ABSCESS;  Surgeon: Jerrell Belfast, MD;  Location: [facility];  Service: ENT;  Laterality: Right;   INJECTION OF SILICONE OIL Right [id number]   Procedure: Extraction OF SILICONE OIL;  Surgeon: Hayden Pedro, MD;  Location: [facility];  Service: Ophthalmology;  Laterality: Right;   ORIF ANKLE FRACTURE Left 06/17/2014   Procedure: OPEN REDUCTION INTERNAL FIXATION (ORIF) LEFT BIMALLEOLAR ANKLE FRACTURE;  Surgeon: Marianna Payment, MD;  Location: [facility];  Service: Orthopedics;  Laterality: Left;   PHOTOCOAGULATION WITH LASER Right 03/16/2015   Procedure: PHOTOCOAGULATION WITH LASER;  Surgeon: Hayden Pedro, MD;  Location: [facility];  Service: Ophthalmology;  Laterality: Right;   SHOULDER ARTHROSCOPY Right    plates and screws   VITRECTOMY Right 03/16/2015   WRIST ARTHROPLASTY Left    plates and screws and bone graft   Social History   Occupational History   Not on file  Tobacco Use   Smoking status: Never   Smokeless tobacco: Never  Vaping Use   Vaping Use: Never used  Substance and Sexual Activity   Alcohol use: No   Drug use: No   Sexual activity: Never    Birth control/protection:  Post-menopausal

## 2021-02-21 ENCOUNTER — Other Ambulatory Visit: Payer: Self-pay

## 2021-02-21 ENCOUNTER — Ambulatory Visit
Admission: RE | Admit: 2021-02-21 | Discharge: 2021-02-21 | Disposition: A | Discharge status: Home / Self Care | Payer: Medicare Other | Source: Ambulatory Visit | Attending: Family Medicine | Admitting: Family Medicine

## 2021-02-21 DIAGNOSIS — N2889 Other specified disorders of kidney and ureter: Secondary | ICD-10-CM | POA: Diagnosis not present

## 2021-02-21 DIAGNOSIS — N281 Cyst of kidney, acquired: Secondary | ICD-10-CM | POA: Diagnosis not present

## 2021-02-21 DIAGNOSIS — N289 Disorder of kidney and ureter, unspecified: Secondary | ICD-10-CM

## 2021-02-21 DIAGNOSIS — K449 Diaphragmatic hernia without obstruction or gangrene: Secondary | ICD-10-CM | POA: Diagnosis not present

## 2021-03-03 ENCOUNTER — Other Ambulatory Visit: Payer: Self-pay

## 2021-03-03 ENCOUNTER — Encounter (HOSPITAL_COMMUNITY): Payer: Self-pay | Admitting: Emergency Medicine

## 2021-03-03 ENCOUNTER — Inpatient Hospital Stay (HOSPITAL_COMMUNITY)
Admission: EM | Admit: 2021-03-03 | Discharge: 2021-03-23 | DRG: 602 | Disposition: A | Discharge status: Skilled Nursing Facility | Payer: Medicare Other | Attending: Family Medicine | Admitting: Family Medicine

## 2021-03-03 DIAGNOSIS — D631 Anemia in chronic kidney disease: Secondary | ICD-10-CM | POA: Diagnosis not present

## 2021-03-03 DIAGNOSIS — J9601 Acute respiratory failure with hypoxia: Secondary | ICD-10-CM | POA: Diagnosis present

## 2021-03-03 DIAGNOSIS — I3139 Other pericardial effusion (noninflammatory): Secondary | ICD-10-CM | POA: Diagnosis present

## 2021-03-03 DIAGNOSIS — R0902 Hypoxemia: Secondary | ICD-10-CM

## 2021-03-03 DIAGNOSIS — E7800 Pure hypercholesterolemia, unspecified: Secondary | ICD-10-CM | POA: Diagnosis present

## 2021-03-03 DIAGNOSIS — I083 Combined rheumatic disorders of mitral, aortic and tricuspid valves: Secondary | ICD-10-CM | POA: Diagnosis present

## 2021-03-03 DIAGNOSIS — F419 Anxiety disorder, unspecified: Secondary | ICD-10-CM | POA: Diagnosis present

## 2021-03-03 DIAGNOSIS — Z7901 Long term (current) use of anticoagulants: Secondary | ICD-10-CM

## 2021-03-03 DIAGNOSIS — E1122 Type 2 diabetes mellitus with diabetic chronic kidney disease: Secondary | ICD-10-CM | POA: Diagnosis present

## 2021-03-03 DIAGNOSIS — Z8614 Personal history of Methicillin resistant Staphylococcus aureus infection: Secondary | ICD-10-CM

## 2021-03-03 DIAGNOSIS — M109 Gout, unspecified: Secondary | ICD-10-CM

## 2021-03-03 DIAGNOSIS — I130 Hypertensive heart and chronic kidney disease with heart failure and stage 1 through stage 4 chronic kidney disease, or unspecified chronic kidney disease: Secondary | ICD-10-CM | POA: Diagnosis not present

## 2021-03-03 DIAGNOSIS — I509 Heart failure, unspecified: Secondary | ICD-10-CM | POA: Diagnosis not present

## 2021-03-03 DIAGNOSIS — I5031 Acute diastolic (congestive) heart failure: Secondary | ICD-10-CM | POA: Diagnosis not present

## 2021-03-03 DIAGNOSIS — M10041 Idiopathic gout, right hand: Secondary | ICD-10-CM | POA: Diagnosis present

## 2021-03-03 DIAGNOSIS — E873 Alkalosis: Secondary | ICD-10-CM | POA: Diagnosis not present

## 2021-03-03 DIAGNOSIS — M130 Polyarthritis, unspecified: Secondary | ICD-10-CM | POA: Diagnosis present

## 2021-03-03 DIAGNOSIS — N179 Acute kidney failure, unspecified: Secondary | ICD-10-CM | POA: Diagnosis not present

## 2021-03-03 DIAGNOSIS — E875 Hyperkalemia: Secondary | ICD-10-CM | POA: Diagnosis present

## 2021-03-03 DIAGNOSIS — L03211 Cellulitis of face: Principal | ICD-10-CM | POA: Diagnosis present

## 2021-03-03 DIAGNOSIS — E871 Hypo-osmolality and hyponatremia: Secondary | ICD-10-CM | POA: Diagnosis not present

## 2021-03-03 DIAGNOSIS — I350 Nonrheumatic aortic (valve) stenosis: Secondary | ICD-10-CM

## 2021-03-03 DIAGNOSIS — F32A Depression, unspecified: Secondary | ICD-10-CM | POA: Diagnosis present

## 2021-03-03 DIAGNOSIS — M10061 Idiopathic gout, right knee: Secondary | ICD-10-CM | POA: Diagnosis not present

## 2021-03-03 DIAGNOSIS — Z79899 Other long term (current) drug therapy: Secondary | ICD-10-CM

## 2021-03-03 DIAGNOSIS — E11649 Type 2 diabetes mellitus with hypoglycemia without coma: Secondary | ICD-10-CM | POA: Diagnosis present

## 2021-03-03 DIAGNOSIS — N1832 Chronic kidney disease, stage 3b: Secondary | ICD-10-CM | POA: Diagnosis not present

## 2021-03-03 DIAGNOSIS — I480 Paroxysmal atrial fibrillation: Secondary | ICD-10-CM | POA: Diagnosis present

## 2021-03-03 DIAGNOSIS — L0293 Carbuncle, unspecified: Secondary | ICD-10-CM | POA: Diagnosis present

## 2021-03-03 DIAGNOSIS — M10062 Idiopathic gout, left knee: Secondary | ICD-10-CM | POA: Diagnosis present

## 2021-03-03 DIAGNOSIS — L0201 Cutaneous abscess of face: Secondary | ICD-10-CM | POA: Diagnosis not present

## 2021-03-03 DIAGNOSIS — M7989 Other specified soft tissue disorders: Secondary | ICD-10-CM | POA: Diagnosis not present

## 2021-03-03 DIAGNOSIS — D509 Iron deficiency anemia, unspecified: Secondary | ICD-10-CM | POA: Diagnosis present

## 2021-03-03 DIAGNOSIS — Z888 Allergy status to other drugs, medicaments and biological substances status: Secondary | ICD-10-CM

## 2021-03-03 DIAGNOSIS — Z794 Long term (current) use of insulin: Secondary | ICD-10-CM

## 2021-03-03 DIAGNOSIS — E11610 Type 2 diabetes mellitus with diabetic neuropathic arthropathy: Secondary | ICD-10-CM | POA: Diagnosis present

## 2021-03-03 DIAGNOSIS — Z8673 Personal history of transient ischemic attack (TIA), and cerebral infarction without residual deficits: Secondary | ICD-10-CM

## 2021-03-03 DIAGNOSIS — E861 Hypovolemia: Secondary | ICD-10-CM | POA: Diagnosis present

## 2021-03-03 DIAGNOSIS — E1165 Type 2 diabetes mellitus with hyperglycemia: Secondary | ICD-10-CM | POA: Diagnosis not present

## 2021-03-03 DIAGNOSIS — S0180XA Unspecified open wound of other part of head, initial encounter: Secondary | ICD-10-CM | POA: Diagnosis not present

## 2021-03-03 DIAGNOSIS — K219 Gastro-esophageal reflux disease without esophagitis: Secondary | ICD-10-CM | POA: Diagnosis present

## 2021-03-03 DIAGNOSIS — Z823 Family history of stroke: Secondary | ICD-10-CM

## 2021-03-03 DIAGNOSIS — E1142 Type 2 diabetes mellitus with diabetic polyneuropathy: Secondary | ICD-10-CM | POA: Diagnosis not present

## 2021-03-03 DIAGNOSIS — M1000 Idiopathic gout, unspecified site: Secondary | ICD-10-CM

## 2021-03-03 DIAGNOSIS — J189 Pneumonia, unspecified organism: Secondary | ICD-10-CM | POA: Diagnosis present

## 2021-03-03 DIAGNOSIS — Z8249 Family history of ischemic heart disease and other diseases of the circulatory system: Secondary | ICD-10-CM

## 2021-03-03 DIAGNOSIS — Z20822 Contact with and (suspected) exposure to covid-19: Secondary | ICD-10-CM | POA: Diagnosis present

## 2021-03-03 HISTORY — DX: Acute diastolic (congestive) heart failure: I50.31

## 2021-03-03 LAB — CBC WITH DIFFERENTIAL/PLATELET
Abs Immature Granulocytes: 0.1 10*3/uL — ABNORMAL HIGH (ref 0.00–0.07)
Basophils Absolute: 0.1 10*3/uL (ref 0.0–0.1)
Basophils Relative: 0 %
Eosinophils Absolute: 0.1 10*3/uL (ref 0.0–0.5)
Eosinophils Relative: 1 %
HCT: 38.5 % (ref 36.0–46.0)
Hemoglobin: 12.5 g/dL (ref 12.0–15.0)
Immature Granulocytes: 0 %
Lymphocytes Relative: 7 %
Lymphs Abs: 1.6 10*3/uL (ref 0.7–4.0)
MCH: 31.7 pg (ref 26.0–34.0)
MCHC: 32.5 g/dL (ref 30.0–36.0)
MCV: 97.7 fL (ref 80.0–100.0)
Monocytes Absolute: 1.7 10*3/uL — ABNORMAL HIGH (ref 0.1–1.0)
Monocytes Relative: 8 %
Neutro Abs: 19 10*3/uL — ABNORMAL HIGH (ref 1.7–7.7)
Neutrophils Relative %: 84 %
Platelets: 281 10*3/uL (ref 150–400)
RBC: 3.94 MIL/uL (ref 3.87–5.11)
RDW: 14.6 % (ref 11.5–15.5)
WBC: 22.6 10*3/uL — ABNORMAL HIGH (ref 4.0–10.5)
nRBC: 0 % (ref 0.0–0.2)

## 2021-03-03 LAB — COMPREHENSIVE METABOLIC PANEL
ALT: 10 U/L (ref 0–44)
AST: 16 U/L (ref 15–41)
Albumin: 2.9 g/dL — ABNORMAL LOW (ref 3.5–5.0)
Alkaline Phosphatase: 72 U/L (ref 38–126)
Anion gap: 7 (ref 5–15)
BUN: 21 mg/dL (ref 8–23)
CO2: 27 mmol/L (ref 22–32)
Calcium: 8.7 mg/dL — ABNORMAL LOW (ref 8.9–10.3)
Chloride: 100 mmol/L (ref 98–111)
Creatinine, Ser: 1.68 mg/dL — ABNORMAL HIGH (ref 0.44–1.00)
GFR, Estimated: 31 mL/min — ABNORMAL LOW (ref >60)
Glucose, Bld: 184 mg/dL — ABNORMAL HIGH (ref 70–99)
Potassium: 4.3 mmol/L (ref 3.5–5.1)
Sodium: 134 mmol/L — ABNORMAL LOW (ref 135–145)
Total Bilirubin: 0.8 mg/dL (ref 0.3–1.2)
Total Protein: 6.2 g/dL — ABNORMAL LOW (ref 6.5–8.1)

## 2021-03-03 NOTE — ED Provider Notes (Signed)
Emergency Medicine Provider Triage Evaluation Note  Sabrina Baker , a 80 y.o. female  was evaluated in triage.  Pt complains of cellulitis to the left face.  Patient states that the lesion appeared last Sunday and has increased in size and is now draining purulent fluid.  Endorses some associated sore throat and jaw pain.  Patient is diabetic, states she has had to have admission with IV antibiotics for previous flares of same.  Review of Systems  Positive: Facial swelling, jaw pain, sore throat Negative: Fever, chills, headache  Physical Exam  BP 127/64 (BP Location: Left Arm)   Pulse 83   Temp 99.1 F (37.3 C) (Oral)   Resp 18   Ht 5\' 3"  (1.6 m)   Wt 80 kg   SpO2 93%   BMI 31.24 kg/m  Gen:   Awake, no distress  Resp:  Normal effort  MSK:   Moves extremities without difficulty    Medical Decision Making  Medically screening exam initiated at 7:39 PM.  Appropriate orders placed.  Sabrina Baker was informed that the remainder of the evaluation will be completed by another provider, this initial triage assessment does not replace that evaluation, and the importance of remaining in the ED until their evaluation is complete.     Nestor Lewandowsky 03/03/21 1945    Lennice Sites, DO 03/03/21 1950

## 2021-03-03 NOTE — ED Triage Notes (Signed)
Patient reports left facial swelling/cellulitis with drainage onset last week , denies fever or chills .

## 2021-03-04 ENCOUNTER — Other Ambulatory Visit (HOSPITAL_COMMUNITY): Payer: Self-pay

## 2021-03-04 ENCOUNTER — Emergency Department (HOSPITAL_COMMUNITY): Payer: Medicare Other

## 2021-03-04 DIAGNOSIS — R0902 Hypoxemia: Secondary | ICD-10-CM | POA: Diagnosis not present

## 2021-03-04 DIAGNOSIS — I130 Hypertensive heart and chronic kidney disease with heart failure and stage 1 through stage 4 chronic kidney disease, or unspecified chronic kidney disease: Secondary | ICD-10-CM | POA: Diagnosis not present

## 2021-03-04 DIAGNOSIS — I3139 Other pericardial effusion (noninflammatory): Secondary | ICD-10-CM | POA: Diagnosis not present

## 2021-03-04 DIAGNOSIS — N1832 Chronic kidney disease, stage 3b: Secondary | ICD-10-CM | POA: Diagnosis present

## 2021-03-04 DIAGNOSIS — M10062 Idiopathic gout, left knee: Secondary | ICD-10-CM | POA: Diagnosis present

## 2021-03-04 DIAGNOSIS — J189 Pneumonia, unspecified organism: Secondary | ICD-10-CM | POA: Diagnosis present

## 2021-03-04 DIAGNOSIS — E871 Hypo-osmolality and hyponatremia: Secondary | ICD-10-CM | POA: Diagnosis not present

## 2021-03-04 DIAGNOSIS — J9691 Respiratory failure, unspecified with hypoxia: Secondary | ICD-10-CM | POA: Diagnosis not present

## 2021-03-04 DIAGNOSIS — I517 Cardiomegaly: Secondary | ICD-10-CM | POA: Diagnosis not present

## 2021-03-04 DIAGNOSIS — M7989 Other specified soft tissue disorders: Secondary | ICD-10-CM | POA: Diagnosis not present

## 2021-03-04 DIAGNOSIS — L0201 Cutaneous abscess of face: Secondary | ICD-10-CM | POA: Diagnosis not present

## 2021-03-04 DIAGNOSIS — D509 Iron deficiency anemia, unspecified: Secondary | ICD-10-CM | POA: Diagnosis present

## 2021-03-04 DIAGNOSIS — E1122 Type 2 diabetes mellitus with diabetic chronic kidney disease: Secondary | ICD-10-CM | POA: Diagnosis present

## 2021-03-04 DIAGNOSIS — D631 Anemia in chronic kidney disease: Secondary | ICD-10-CM | POA: Diagnosis present

## 2021-03-04 DIAGNOSIS — I5033 Acute on chronic diastolic (congestive) heart failure: Secondary | ICD-10-CM | POA: Diagnosis not present

## 2021-03-04 DIAGNOSIS — J9601 Acute respiratory failure with hypoxia: Secondary | ICD-10-CM | POA: Diagnosis not present

## 2021-03-04 DIAGNOSIS — R531 Weakness: Secondary | ICD-10-CM | POA: Diagnosis not present

## 2021-03-04 DIAGNOSIS — F32A Depression, unspecified: Secondary | ICD-10-CM | POA: Diagnosis present

## 2021-03-04 DIAGNOSIS — J811 Chronic pulmonary edema: Secondary | ICD-10-CM | POA: Diagnosis not present

## 2021-03-04 DIAGNOSIS — I509 Heart failure, unspecified: Secondary | ICD-10-CM | POA: Diagnosis not present

## 2021-03-04 DIAGNOSIS — E11649 Type 2 diabetes mellitus with hypoglycemia without coma: Secondary | ICD-10-CM | POA: Diagnosis not present

## 2021-03-04 DIAGNOSIS — Z20822 Contact with and (suspected) exposure to covid-19: Secondary | ICD-10-CM | POA: Diagnosis not present

## 2021-03-04 DIAGNOSIS — R2681 Unsteadiness on feet: Secondary | ICD-10-CM | POA: Diagnosis not present

## 2021-03-04 DIAGNOSIS — J90 Pleural effusion, not elsewhere classified: Secondary | ICD-10-CM | POA: Diagnosis not present

## 2021-03-04 DIAGNOSIS — M10061 Idiopathic gout, right knee: Secondary | ICD-10-CM | POA: Diagnosis present

## 2021-03-04 DIAGNOSIS — E1165 Type 2 diabetes mellitus with hyperglycemia: Secondary | ICD-10-CM | POA: Diagnosis present

## 2021-03-04 DIAGNOSIS — M10041 Idiopathic gout, right hand: Secondary | ICD-10-CM | POA: Diagnosis present

## 2021-03-04 DIAGNOSIS — M1000 Idiopathic gout, unspecified site: Secondary | ICD-10-CM | POA: Diagnosis not present

## 2021-03-04 DIAGNOSIS — I083 Combined rheumatic disorders of mitral, aortic and tricuspid valves: Secondary | ICD-10-CM | POA: Diagnosis present

## 2021-03-04 DIAGNOSIS — Z743 Need for continuous supervision: Secondary | ICD-10-CM | POA: Diagnosis not present

## 2021-03-04 DIAGNOSIS — I10 Essential (primary) hypertension: Secondary | ICD-10-CM | POA: Diagnosis not present

## 2021-03-04 DIAGNOSIS — E1142 Type 2 diabetes mellitus with diabetic polyneuropathy: Secondary | ICD-10-CM | POA: Diagnosis not present

## 2021-03-04 DIAGNOSIS — E11610 Type 2 diabetes mellitus with diabetic neuropathic arthropathy: Secondary | ICD-10-CM | POA: Diagnosis not present

## 2021-03-04 DIAGNOSIS — N179 Acute kidney failure, unspecified: Secondary | ICD-10-CM | POA: Diagnosis not present

## 2021-03-04 DIAGNOSIS — M6281 Muscle weakness (generalized): Secondary | ICD-10-CM | POA: Diagnosis not present

## 2021-03-04 DIAGNOSIS — E873 Alkalosis: Secondary | ICD-10-CM | POA: Diagnosis present

## 2021-03-04 DIAGNOSIS — I5031 Acute diastolic (congestive) heart failure: Secondary | ICD-10-CM | POA: Diagnosis not present

## 2021-03-04 DIAGNOSIS — L03211 Cellulitis of face: Principal | ICD-10-CM | POA: Diagnosis present

## 2021-03-04 DIAGNOSIS — M109 Gout, unspecified: Secondary | ICD-10-CM | POA: Diagnosis not present

## 2021-03-04 DIAGNOSIS — L0390 Cellulitis, unspecified: Secondary | ICD-10-CM | POA: Diagnosis not present

## 2021-03-04 DIAGNOSIS — Z888 Allergy status to other drugs, medicaments and biological substances status: Secondary | ICD-10-CM | POA: Diagnosis not present

## 2021-03-04 DIAGNOSIS — I350 Nonrheumatic aortic (valve) stenosis: Secondary | ICD-10-CM | POA: Diagnosis not present

## 2021-03-04 DIAGNOSIS — S0180XA Unspecified open wound of other part of head, initial encounter: Secondary | ICD-10-CM | POA: Diagnosis not present

## 2021-03-04 LAB — BASIC METABOLIC PANEL
Anion gap: 10 (ref 5–15)
BUN: 23 mg/dL (ref 8–23)
CO2: 25 mmol/L (ref 22–32)
Calcium: 8.6 mg/dL — ABNORMAL LOW (ref 8.9–10.3)
Chloride: 98 mmol/L (ref 98–111)
Creatinine, Ser: 1.7 mg/dL — ABNORMAL HIGH (ref 0.44–1.00)
GFR, Estimated: 30 mL/min — ABNORMAL LOW (ref >60)
Glucose, Bld: 255 mg/dL — ABNORMAL HIGH (ref 70–99)
Potassium: 4.5 mmol/L (ref 3.5–5.1)
Sodium: 133 mmol/L — ABNORMAL LOW (ref 135–145)

## 2021-03-04 LAB — CBC
HCT: 38 % (ref 36.0–46.0)
Hemoglobin: 12.3 g/dL (ref 12.0–15.0)
MCH: 32 pg (ref 26.0–34.0)
MCHC: 32.4 g/dL (ref 30.0–36.0)
MCV: 99 fL (ref 80.0–100.0)
Platelets: 259 10*3/uL (ref 150–400)
RBC: 3.84 MIL/uL — ABNORMAL LOW (ref 3.87–5.11)
RDW: 14.6 % (ref 11.5–15.5)
WBC: 21.9 10*3/uL — ABNORMAL HIGH (ref 4.0–10.5)
nRBC: 0 % (ref 0.0–0.2)

## 2021-03-04 LAB — CBG MONITORING, ED
Glucose-Capillary: 199 mg/dL — ABNORMAL HIGH (ref 70–99)
Glucose-Capillary: 278 mg/dL — ABNORMAL HIGH (ref 70–99)

## 2021-03-04 LAB — MAGNESIUM: Magnesium: 1.8 mg/dL (ref 1.7–2.4)

## 2021-03-04 LAB — RESP PANEL BY RT-PCR (FLU A&B, COVID) ARPGX2
Influenza A by PCR: NEGATIVE
Influenza B by PCR: NEGATIVE
SARS Coronavirus 2 by RT PCR: NEGATIVE

## 2021-03-04 LAB — PHOSPHORUS: Phosphorus: 3.2 mg/dL (ref 2.5–4.6)

## 2021-03-04 LAB — HEMOGLOBIN A1C
Hgb A1c MFr Bld: 8.1 % — ABNORMAL HIGH (ref 4.8–5.6)
Mean Plasma Glucose: 185.77 mg/dL

## 2021-03-04 LAB — MRSA NEXT GEN BY PCR, NASAL: MRSA by PCR Next Gen: NOT DETECTED

## 2021-03-04 MED ORDER — SODIUM CHLORIDE 0.9 % IV SOLN
2.0000 g | Freq: Once | INTRAVENOUS | Status: AC
  Administered 2021-03-04: 2 g via INTRAVENOUS
  Filled 2021-03-04: qty 20

## 2021-03-04 MED ORDER — AMLODIPINE BESYLATE 10 MG PO TABS
10.0000 mg | ORAL_TABLET | Freq: Every day | ORAL | Status: DC
  Administered 2021-03-04 – 2021-03-23 (×20): 10 mg via ORAL
  Filled 2021-03-04 (×3): qty 1
  Filled 2021-03-04: qty 2
  Filled 2021-03-04 (×17): qty 1

## 2021-03-04 MED ORDER — MELATONIN 3 MG PO TABS
3.0000 mg | ORAL_TABLET | Freq: Every evening | ORAL | Status: DC | PRN
  Administered 2021-03-08 – 2021-03-21 (×7): 3 mg via ORAL
  Filled 2021-03-04 (×9): qty 1

## 2021-03-04 MED ORDER — ONDANSETRON HCL 4 MG/2ML IJ SOLN
4.0000 mg | Freq: Four times a day (QID) | INTRAMUSCULAR | Status: AC | PRN
Start: 2021-03-04 — End: 2021-03-04
  Administered 2021-03-04 (×2): 4 mg via INTRAVENOUS
  Filled 2021-03-04 (×2): qty 2

## 2021-03-04 MED ORDER — SACCHAROMYCES BOULARDII 250 MG PO CAPS: 250.0000 mg | ORAL_CAPSULE | Freq: Two times a day (BID) | ORAL | Status: DC

## 2021-03-04 MED ORDER — CARVEDILOL 25 MG PO TABS
25.0000 mg | ORAL_TABLET | Freq: Two times a day (BID) | ORAL | Status: DC
  Administered 2021-03-04 – 2021-03-23 (×39): 25 mg via ORAL
  Filled 2021-03-04 (×10): qty 1
  Filled 2021-03-04: qty 2
  Filled 2021-03-04 (×28): qty 1

## 2021-03-04 MED ORDER — LINEZOLID 600 MG PO TABS
600.0000 mg | ORAL_TABLET | Freq: Two times a day (BID) | ORAL | Status: AC
  Administered 2021-03-04 – 2021-03-13 (×21): 600 mg via ORAL
  Filled 2021-03-04 (×22): qty 1

## 2021-03-04 MED ORDER — BACITRACIN-POLYMYXIN B 500-10000 UNIT/GM OP OINT: 1.0000 "application " | TOPICAL_OINTMENT | Freq: Every day | OPHTHALMIC | Status: DC | PRN

## 2021-03-04 MED ORDER — DULOXETINE HCL 60 MG PO CPEP
60.0000 mg | ORAL_CAPSULE | Freq: Every day | ORAL | Status: DC
  Administered 2021-03-04 – 2021-03-23 (×20): 60 mg via ORAL
  Filled 2021-03-04 (×20): qty 1

## 2021-03-04 MED ORDER — OXYCODONE HCL 5 MG PO TABS
5.0000 mg | ORAL_TABLET | Freq: Four times a day (QID) | ORAL | Status: DC | PRN
Start: 2021-03-04 — End: 2021-03-23
  Administered 2021-03-04 – 2021-03-22 (×29): 5 mg via ORAL
  Filled 2021-03-04 (×30): qty 1

## 2021-03-04 MED ORDER — ALLOPURINOL 100 MG PO TABS
100.0000 mg | ORAL_TABLET | Freq: Every day | ORAL | Status: DC
  Administered 2021-03-04 – 2021-03-23 (×20): 100 mg via ORAL
  Filled 2021-03-04 (×21): qty 1

## 2021-03-04 MED ORDER — VALACYCLOVIR HCL 500 MG PO TABS
1000.0000 mg | ORAL_TABLET | Freq: Every day | ORAL | Status: DC
  Administered 2021-03-05 – 2021-03-08 (×4): 1000 mg via ORAL
  Filled 2021-03-04 (×6): qty 2

## 2021-03-04 MED ORDER — SODIUM CHLORIDE 0.9 % IV SOLN: 2.0000 g | INTRAVENOUS | Status: DC

## 2021-03-04 MED ORDER — LACTATED RINGERS IV SOLN: INTRAVENOUS | Status: AC

## 2021-03-04 MED ORDER — INSULIN ASPART 100 UNIT/ML IJ SOLN
0.0000 [IU] | Freq: Three times a day (TID) | INTRAMUSCULAR | Status: DC
  Administered 2021-03-04: 1 [IU] via SUBCUTANEOUS
  Administered 2021-03-04: 5 [IU] via SUBCUTANEOUS
  Administered 2021-03-05: 7 [IU] via SUBCUTANEOUS
  Administered 2021-03-05: 5 [IU] via SUBCUTANEOUS
  Administered 2021-03-06: 2 [IU] via SUBCUTANEOUS
  Administered 2021-03-06: 3 [IU] via SUBCUTANEOUS
  Administered 2021-03-07: 5 [IU] via SUBCUTANEOUS
  Administered 2021-03-07 – 2021-03-08 (×3): 2 [IU] via SUBCUTANEOUS
  Administered 2021-03-09: 3 [IU] via SUBCUTANEOUS
  Administered 2021-03-09 – 2021-03-10 (×2): 2 [IU] via SUBCUTANEOUS
  Administered 2021-03-10 – 2021-03-11 (×2): 1 [IU] via SUBCUTANEOUS
  Administered 2021-03-11: 3 [IU] via SUBCUTANEOUS
  Administered 2021-03-12: 2 [IU] via SUBCUTANEOUS
  Administered 2021-03-12: 1 [IU] via SUBCUTANEOUS
  Administered 2021-03-12 – 2021-03-13 (×2): 2 [IU] via SUBCUTANEOUS
  Administered 2021-03-14: 1 [IU] via SUBCUTANEOUS
  Administered 2021-03-14 – 2021-03-15 (×2): 3 [IU] via SUBCUTANEOUS
  Administered 2021-03-15: 1 [IU] via SUBCUTANEOUS
  Administered 2021-03-16 – 2021-03-18 (×5): 2 [IU] via SUBCUTANEOUS
  Administered 2021-03-19: 3 [IU] via SUBCUTANEOUS
  Administered 2021-03-19: 1 [IU] via SUBCUTANEOUS
  Administered 2021-03-20: 3 [IU] via SUBCUTANEOUS
  Administered 2021-03-20: 1 [IU] via SUBCUTANEOUS
  Administered 2021-03-20: 3 [IU] via SUBCUTANEOUS
  Administered 2021-03-21 (×2): 2 [IU] via SUBCUTANEOUS
  Administered 2021-03-22: 3 [IU] via SUBCUTANEOUS
  Administered 2021-03-22 – 2021-03-23 (×3): 2 [IU] via SUBCUTANEOUS

## 2021-03-04 MED ORDER — ENOXAPARIN SODIUM 40 MG/0.4ML IJ SOSY
40.0000 mg | PREFILLED_SYRINGE | Freq: Every day | INTRAMUSCULAR | Status: DC
  Administered 2021-03-04 (×2): 40 mg via SUBCUTANEOUS
  Filled 2021-03-04 (×2): qty 0.4

## 2021-03-04 MED ORDER — VALACYCLOVIR HCL 500 MG PO TABS: 1000.0000 mg | ORAL_TABLET | Freq: Three times a day (TID) | ORAL | Status: DC

## 2021-03-04 MED ORDER — POLYETHYLENE GLYCOL 3350 17 G PO PACK: 17.0000 g | PACK | Freq: Every day | ORAL | Status: DC | PRN

## 2021-03-04 MED ORDER — INSULIN ASPART 100 UNIT/ML IJ SOLN
0.0000 [IU] | Freq: Every day | INTRAMUSCULAR | Status: DC
  Administered 2021-03-04: 5 [IU] via SUBCUTANEOUS
  Administered 2021-03-05: 2 [IU] via SUBCUTANEOUS
  Administered 2021-03-07: 3 [IU] via SUBCUTANEOUS
  Administered 2021-03-08: 2 [IU] via SUBCUTANEOUS

## 2021-03-04 MED ORDER — ACETAMINOPHEN 325 MG PO TABS
650.0000 mg | ORAL_TABLET | Freq: Four times a day (QID) | ORAL | Status: DC | PRN
  Administered 2021-03-04 – 2021-03-10 (×4): 650 mg via ORAL
  Filled 2021-03-04 (×4): qty 2

## 2021-03-04 MED ORDER — HYDROMORPHONE HCL 1 MG/ML IJ SOLN
0.5000 mg | INTRAMUSCULAR | Status: DC | PRN
  Administered 2021-03-04 (×4): 0.5 mg via INTRAVENOUS
  Filled 2021-03-04 (×5): qty 1

## 2021-03-04 MED ORDER — GABAPENTIN 100 MG PO CAPS
200.0000 mg | ORAL_CAPSULE | Freq: Every day | ORAL | Status: DC
  Administered 2021-03-04 – 2021-03-22 (×19): 200 mg via ORAL
  Filled 2021-03-04 (×20): qty 2

## 2021-03-04 MED ORDER — ATORVASTATIN CALCIUM 10 MG PO TABS
10.0000 mg | ORAL_TABLET | Freq: Every day | ORAL | Status: DC
  Administered 2021-03-04 – 2021-03-23 (×20): 10 mg via ORAL
  Filled 2021-03-04 (×21): qty 1

## 2021-03-04 NOTE — Progress Notes (Addendum)
Pharmacy Antibiotic Note  Sabrina Baker is a 80 y.o. female admitted on 03/03/2021 with cellulitis.  Pharmacy has been consulted for vanc dosing.  Pt presented to the ED with facial cellulitis that has been draining purulent fluid since last week. Vanc has been ordered. She has AKI. D/w Dr. Sedonia Small and we will use PO Zyvox instead to cover for staph aureus to avoid AKI. There is a low risk for serotonin syndrome with SSRIs but recent literature show similar rates in monotherapy. Dr. Nevada Crane is aware.   Scr 1.68 CrCl~27 ml/min Wbc 22.6  Plan: Zyvox 600mg  PO BID  Height: 5\' 3"  (160 cm) Weight: 80 kg (176 lb 5.9 oz) IBW/kg (Calculated) : 52.4  Temp (24hrs), Avg:99.8 F (37.7 C), Min:99.1 F (37.3 C), Max:100.5 F (38.1 C)  Recent Labs  Lab 03/03/21 1945  WBC 22.6*  CREATININE 1.68*    Estimated Creatinine Clearance: 27.2 mL/min (A) (by C-G formula based on SCr of 1.68 mg/dL (H)).    Allergies  Allergen Reactions   Ramipril Anaphylaxis and Other (See Comments)   Other Other (See Comments)   Cortisone Itching and Rash   Niacin Diarrhea    And nausea   Niacin And Related Diarrhea   Valsartan Other (See Comments)    Hyperkalemia     Antimicrobials this admission: 9/23 ceftriaxone x1 9/23 zyvox>>  Dose adjustments this admission:   Microbiology results: 9/23 blood>>  Onnie Boer, PharmD, [place], AAHIVP, CPP Infectious Disease Pharmacist 03/04/2021 2:47 AM

## 2021-03-04 NOTE — H&P (Addendum)
History and Physical  Sabrina Baker MEQ:[id number] DOB: [birth year] DOA: 03/03/2021  Referring physician: Dr. Sedonia Small, ENT. PCP: [name], MD  Outpatient Specialists: Infectious disease, ENT. Patient coming from: Home.  Chief Complaint: Left facial swelling and redness.  HPI: Sabrina Baker is a 80 y.o. female with medical history significant for insulin-dependent type 2 diabetes, diabetic polyneuropathy, Charcot foot, CKD 3B, paroxysmal A. fib on Eliquis, hyperlipidemia, hypertension, tophaceous gout, iron deficiency anemia, anemia of chronic disease, chronic anxiety/depression, who presented to [facility] ED from home due to gradually worsening left facial cellulitis.  Noted swelling and erythema on 02/27/2021.  3 days ago she noted a purulent drainage.  She used over-the-counter remedies without improvement.  Today she presents to the ED for further evaluation and management.  Upon presentation to the ED, she is febrile with T-max of 100.5, respiration rate 23, WBC 22.6 with evidence of cellulitis on left face.  CT maxillofacial without contrast showed left facial cellulitis versus parotiditis.  Clinical correlation is recommended.  No drainage fluid collection or abscess.  Patient was started on IV antibiotics empirically.  TRH, hospitalist team, was asked to admit.  ED Course:  Temperature 100.5.  BP 134/66, pulse 84, respiration rate 23, saturation 96% on room air.  Lab studies remarkable for creatinine 1.68 from 1.49 a month ago.  Serum glucose 184.  WBC 22.6.  Review of Systems: Review of systems as noted in the HPI. All other systems reviewed and are negative.   Past Medical History:  Diagnosis Date   Carotid stenosis    Carotid US 1/22: Bilateral ICA 1-39; follow-up as needed   Chronic kidney disease    stage 3   Diabetes mellitus without complication (HCC)    Gout    Hypercholesteremia    Hypertension    Neuromuscular disorder (HCC)    neuropathy   Osteopenia    Retinal  detachment    TIA (transient ischemic attack)    Vitamin B12 deficiency    Past Surgical History:  Procedure Laterality Date   25 GAUGE PARS PLANA VITRECTOMY WITH 20 GAUGE MVR PORT Right 03/16/2015   Procedure: 25 GAUGE PARS PLANA VITRECTOMY WITH 23 GAUGE MVR PORT;  Surgeon: Hayden Pedro, MD;  Location: [place];  Service: Ophthalmology;  Laterality: Right;   ANKLE SURGERY     BREAST EXCISIONAL BIOPSY     BREAST LUMPECTOMY WITH RADIOACTIVE SEED LOCALIZATION Right 01/24/2016   Procedure: RIGHT BREAST LUMPECTOMY WITH RADIOACTIVE SEED LOCALIZATION;  Surgeon: Coralie Keens, MD;  Location: [place];  Service: General;  Laterality: Right;   EYE SURGERY     detached retna X2   HEEL SPUR SURGERY  2012   INCISION AND DRAINAGE ABSCESS Right 11/21/2019   Procedure: INCISION AND DRAINAGE FOREHEAD & RIGHT EAR ABSCESS;  Surgeon: Jerrell Belfast, MD;  Location: [place];  Service: ENT;  Laterality: Right;   INJECTION OF SILICONE OIL Right 29/12/9890   Procedure: Extraction OF SILICONE OIL;  Surgeon: Hayden Pedro, MD;  Location: [place];  Service: Ophthalmology;  Laterality: Right;   ORIF ANKLE FRACTURE Left 06/17/2014   Procedure: OPEN REDUCTION INTERNAL FIXATION (ORIF) LEFT BIMALLEOLAR ANKLE FRACTURE;  Surgeon: Marianna Payment, MD;  Location: [place];  Service: Orthopedics;  Laterality: Left;   PHOTOCOAGULATION WITH LASER Right 03/16/2015   Procedure: PHOTOCOAGULATION WITH LASER;  Surgeon: Hayden Pedro, MD;  Location: [place];  Service: Ophthalmology;  Laterality: Right;   SHOULDER ARTHROSCOPY Right    plates and screws  VITRECTOMY Right 03/16/2015   WRIST ARTHROPLASTY Left    plates and screws and bone graft    Social History:  reports that she has never smoked. She has never used smokeless tobacco. She reports that she does not drink alcohol and does not use drugs.   Allergies  Allergen Reactions   Ramipril Anaphylaxis and Other (See Comments)   Other Other (See Comments)    Cortisone Itching and Rash   Niacin Diarrhea    And nausea   Niacin And Related Diarrhea   Valsartan Other (See Comments)    Hyperkalemia     Family history: Father with a history of hypertension, TIA, sudden death. Sister with history of thoracic aneurysm.   Prior to Admission medications   Medication Sig Start Date End Date Taking? Authorizing Provider  acetaminophen (TYLENOL) 650 MG CR tablet 2 tablets as needed    [provider]  allopurinol (ZYLOPRIM) 100 MG tablet Take 100 mg by mouth daily.    [provider]  amLODipine (NORVASC) 10 MG tablet Take 10 mg by mouth daily. 06/27/19   [provider]  apixaban (ELIQUIS) 5 MG TABS tablet Take 1 tablet (5 mg total) by mouth 2 (two) times daily. 10/11/18   Bonnielee Haff, MD  atorvastatin (LIPITOR) 10 MG tablet Take 10 mg by mouth daily.    [provider]  bacitracin-polymyxin b (POLYSPORIN) ophthalmic ointment Place 1 application into the left eye daily as needed (For eye infection).  07/07/19   [provider]  bevacizumab (AVASTIN) 400 MG/16ML SOLN Inject 5 mg/kg into the vein once.    [provider]  Blood Glucose Monitoring Suppl (GLUCOCOM BLOOD GLUCOSE MONITOR) DEVI Use to test blood sugar 3 times a day (E11.65) 06/18/20   [provider]  CALCIUM-MAGNESIUM-ZINC PO Take 1 tablet by mouth in the morning and at bedtime.     [provider]  calcium-vitamin D (OSCAL WITH D) 500-200 MG-UNIT TABS tablet Take 1 tablet by mouth daily.     [provider]  carvedilol (COREG) 25 MG tablet Take 25 mg by mouth 2 (two) times daily with a meal.    [provider]  cholecalciferol (VITAMIN D) 1000 UNITS tablet Take 1,000 Units by mouth daily.    [provider]  colchicine 0.6 MG tablet Take 0.6 mg by mouth daily. Started 04/07/13    [provider]  [employer] B-12 500 MCG tablet Take 1,000 mcg by mouth daily. 11/15/18   [provider]   DULoxetine (CYMBALTA) 60 MG capsule Take by mouth daily. 07/03/18   [provider]  Ferrous Sulfate (IRON) 325 (65 FE) MG TABS Take 1 tablet by mouth 3 (three) times daily.    [provider]  FLORASTOR 250 MG capsule TAKE 1 CAPSULE BY MOUTH TWO TIMES DAILY FOR 10 DAYS. 11/25/19   [provider]  furosemide (LASIX) 20 MG tablet Take 20 mg by mouth daily. 08/21/19   [provider]  gabapentin (NEURONTIN) 100 MG capsule Take 200 mg by mouth at bedtime. 06/09/17   [provider]  HYDROcodone-acetaminophen (NORCO/VICODIN) 5-325 MG tablet Take 1 tablet by mouth every 6 (six) hours as needed for moderate pain. 04/17/19   Hilts, Legrand Como, MD  Insulin Glargine, 2 Unit Dial, (TOUJEO MAX SOLOSTAR) 300 UNIT/ML SOPN Inject 14-15 Units into the skin daily. Inject 14 units when sugar is 157 if higher inject 15 units per patient    [provider]  Insulin Pen Needle 32G X  4 MM MISC USE TO INJECT INSULIN ONCE A DAY 04/05/18   [provider]  ketoconazole (NIZORAL) 2 % shampoo Apply 1 application topically 2 (two) times a week.  02/27/19   [provider]  Lancets 30G MISC Use to test blood sugars 3 times a day (Dx: E11.65)    [provider]  latanoprost (XALATAN) 0.005 % ophthalmic solution Place 1 drop into the left eye at bedtime.  05/20/17   [provider]  Magnesium 500 MG CAPS Take 1 capsule by mouth daily.    [provider]  mupirocin cream (BACTROBAN) 2 % Apply topically 2 (two) times daily. Apply to forehead wound until healed 11/25/19   Nolberto Hanlon, MD  mupirocin ointment (BACTROBAN) 2 % APPLY TOPICALLY TWO TIMES DAILY. APPLY TO FOREHEAD WOUND UNTIL HEALED 11/25/19   [provider]  ONETOUCH VERIO test strip USE AS DIRECTED. TESTING FREQUENCY  3 X/DAILY (DX E11.65) 11/17/18   [provider]  pantoprazole (PROTONIX) 40 MG tablet Take 40 mg by mouth 2 (two) times daily.    [provider]  triamcinolone cream (KENALOG) 0.1 % Apply 1 application topically 2 (two) times daily as needed (For rash).  01/09/17   [provider]  trimethoprim-polymyxin b (POLYTRIM) ophthalmic solution Use 4 times in left eye the day of injection, 4 drop the following day.    [provider]  TRULICITY 1.5 YK/9.9IP SOPN Inject 3 mg into the skin every 7 (seven) days. 11/25/18   [provider]    Physical Exam: BP (!) 170/77 (BP Location: Left Arm)   Pulse 84   Temp 99.7 F (37.6 C) (Oral)   Resp 16   Ht 5\' 3"  (1.6 m)   Wt 80 kg   SpO2 97%   BMI 31.24 kg/m   General: 80 y.o. year-old female well developed well nourished in no acute distress.  Alert and oriented x3. Cardiovascular: Regular rate and rhythm with no rubs or gallops.  No thyromegaly or JVD noted.  No lower extremity edema. 2/4 pulses in all 4 extremities. Respiratory: Clear to auscultation with no wheezes or rales. Good inspiratory effort. Abdomen: Soft nontender nondistended with normal bowel sounds x4 quadrants. Muskuloskeletal: No cyanosis, clubbing or edema noted bilaterally.  Charcot feet. Neuro: CN II-XII intact, strength, sensation, reflexes Skin:   Psychiatry: Judgement and insight appear normal. Mood is appropriate for condition and setting          Labs on Admission:  Basic Metabolic Panel: Recent Labs  Lab 03/03/21 1945  NA 134*  K 4.3  CL 100  CO2 27  GLUCOSE 184*  BUN 21  CREATININE 1.68*  CALCIUM 8.7*   Liver Function Tests: Recent Labs  Lab 03/03/21 1945  AST 16  ALT 10  ALKPHOS 72  BILITOT 0.8  PROT 6.2*  ALBUMIN 2.9*   No results for input(s): LIPASE, AMYLASE in the last 168 hours. No results for input(s): AMMONIA in the last 168 hours. CBC: Recent Labs  Lab 03/03/21 1945  WBC 22.6*  NEUTROABS 19.0*  HGB 12.5  HCT 38.5  MCV 97.7  PLT 281   Cardiac Enzymes: No results for input(s): CKTOTAL, CKMB, CKMBINDEX, TROPONINI in the last 168  hours.  BNP (last 3 results) No results for input(s): BNP in the last 8760 hours.  ProBNP (last 3 results) No results for input(s): PROBNP in the last 8760 hours.  CBG: No results for input(s): GLUCAP in the last 168 hours.  Radiological Exams  on Admission: CT MAXILLOFACIAL WO CONTRAST  Result Date: 03/04/2021 CLINICAL DATA:  Facial wound and abscess. EXAM: CT MAXILLOFACIAL WITHOUT CONTRAST TECHNIQUE: Multidetector CT imaging of the maxillofacial structures was performed. Multiplanar CT image reconstructions were also generated. COMPARISON:  Head CT dated 11/17/2019. FINDINGS: Osseous: No fracture or mandibular dislocation. No destructive process. Orbits: The globes and retro-orbital fat are preserved. Right scleral banding. Sinuses: Clear. Soft tissues: There is diffuse skin thickening and soft tissue swelling of the left side of the face in the region of the left parotid gland with thickening of the left earlobe. No drainable fluid collection or abscess. Several top-normal left cervical lymph nodes, likely reactive. Limited intracranial: Mild degenerative changes.  No acute findings. IMPRESSION: Left facial cellulitis versus parotiditis. Clinical correlation is recommended. No drainable fluid collection or abscess. Electronically Signed   By: Anner Crete M.D.   On: 03/04/2021 01:31    EKG: I independently viewed the EKG done and my findings are as followed: None available at the time of this visit.  Assessment/Plan Present on Admission:  Facial cellulitis  Active Problems:   Facial cellulitis  Sepsis secondary to left facial cellulitis with purulent drainage, possible parotiditis, POA. Presented with fever with T-max 100.5, leukocytosis 22.6, tachypnea respiration rate 23 and evidence of cellulitis. Started on IV Rocephin and p.o. linezolid empirically in the ED, continue. Follow blood cultures x2 peripherally Follow MRSA screen Monitor fever curve and WBC Wound care specialist  for Local wound care ENT consulted, Dr. Redmond Baseman, via secure chat on Epic  AKI on CKD 3B, suspect prerenal in the setting of dehydration Hypovolemic on exam IV fluid hydration Monitor urine output Avoid nephrotoxic agents, dehydration and hypotension.  Type 2 diabetes with hyperglycemia She presented with hyperglycemia Last hemoglobin A1c 8.9 on 11/20/2019. Obtain hemoglobin A1c Start insulin sliding scale. Gentle IV fluid hydration LR 50 cc/h x 2 days  Paroxysmal A. fib on Eliquis Hold off Eliquis due to possible procedure Resume home Coreg.  Diabetic polyneuropathy/charcoaled feet Resume home regimen PT OT assessment Fall precautions  Mild hypovolemic hyponatremia Serum sodium 134 IV fluid hydration Repeat chemistry panel in the morning.  Hypertension Resume home oral antihypertensives IV antihypertensives as needed with parameters  Iron deficiency anemia/anemia of chronic disease Resume home ferrous sulfate  GERD Resume home PPI.    DVT prophylaxis: SCDs  Code Status: Full code  Family Communication: None at bedside  Disposition Plan: Admitted to telemetry medical  Consults called: ENT via secure chat.  Admission status: Inpatient status.  Patient will require at least 2 midnights for further evaluation and treatment of present condition.   Status is: Inpatient    Dispo:  Patient From:  Home  Planned Disposition:  Home  Medically stable for discharge:  No          Kayleen Memos MD [facility] Pager [phone]  If 7PM-7AM, please contact night-coverage [email or web address] Password [email or web address]  03/04/2021, 3:24 AM

## 2021-03-04 NOTE — Consult Note (Signed)
Reason for Consult: Facial infection Referring Physician: Hospitalist  Sabrina Baker is an 80 y.o. female.  HPI: 80 year old female with diabetes, gout, hypertension, kidney disease and previous history of MRSA facial abscesses drained by Dr. Wilburn Cornelia in 2021 presented to the ER with 5 days of worsening left cheek swelling pain that has ulcerated and been draining.  CT imaging does not demonstrate any fluid collection.  Infectious disease has recommended treatment with linezolid and valacyclovir.  Past Medical History:  Diagnosis Date   Carotid stenosis    Carotid US 1/22: Bilateral ICA 1-39; follow-up as needed   Chronic kidney disease    stage 3   Diabetes mellitus without complication (HCC)    Gout    Hypercholesteremia    Hypertension    Neuromuscular disorder (HCC)    neuropathy   Osteopenia    Retinal detachment    TIA (transient ischemic attack)    Vitamin B12 deficiency     Past Surgical History:  Procedure Laterality Date   25 GAUGE PARS PLANA VITRECTOMY WITH 20 GAUGE MVR PORT Right 03/16/2015   Procedure: 25 GAUGE PARS PLANA VITRECTOMY WITH 23 GAUGE MVR PORT;  Surgeon: Hayden Pedro, MD;  Location: [facility];  Service: Ophthalmology;  Laterality: Right;   ANKLE SURGERY     BREAST EXCISIONAL BIOPSY     BREAST LUMPECTOMY WITH RADIOACTIVE SEED LOCALIZATION Right 01/24/2016   Procedure: RIGHT BREAST LUMPECTOMY WITH RADIOACTIVE SEED LOCALIZATION;  Surgeon: Coralie Keens, MD;  Location: [facility];  Service: General;  Laterality: Right;   EYE SURGERY     detached retna X2   HEEL SPUR SURGERY  2012   INCISION AND DRAINAGE ABSCESS Right 11/21/2019   Procedure: INCISION AND DRAINAGE FOREHEAD & RIGHT EAR ABSCESS;  Surgeon: Jerrell Belfast, MD;  Location: [facility];  Service: ENT;  Laterality: Right;   INJECTION OF SILICONE OIL Right 68/08/4194   Procedure: Extraction OF SILICONE OIL;  Surgeon: Hayden Pedro, MD;  Location: [facility];  Service: Ophthalmology;   Laterality: Right;   ORIF ANKLE FRACTURE Left 06/17/2014   Procedure: OPEN REDUCTION INTERNAL FIXATION (ORIF) LEFT BIMALLEOLAR ANKLE FRACTURE;  Surgeon: Marianna Payment, MD;  Location: [facility];  Service: Orthopedics;  Laterality: Left;   PHOTOCOAGULATION WITH LASER Right 03/16/2015   Procedure: PHOTOCOAGULATION WITH LASER;  Surgeon: Hayden Pedro, MD;  Location: [facility];  Service: Ophthalmology;  Laterality: Right;   SHOULDER ARTHROSCOPY Right    plates and screws   VITRECTOMY Right 03/16/2015   WRIST ARTHROPLASTY Left    plates and screws and bone graft    No family history on file.  Social History:  reports that she has never smoked. She has never used smokeless tobacco. She reports that she does not drink alcohol and does not use drugs.  Allergies:  Allergies  Allergen Reactions   Ramipril Anaphylaxis and Other (See Comments)   Other Other (See Comments)   Cortisone Itching and Rash   Niacin Diarrhea    And nausea   Niacin And Related Diarrhea   Valsartan Other (See Comments)    Hyperkalemia     Medications: I have reviewed the patient's current medications.  Results for orders placed or performed during the hospital encounter of 03/03/21 (from the past 48 hour(s))  CBC with Differential     Status: Abnormal   Collection Time: 03/03/21  7:45 PM  Result Value Ref Range   WBC 22.6 (H) 4.0 - 10.5 K/uL   RBC 3.94  3.87 - 5.11 MIL/uL   Hemoglobin 12.5 12.0 - 15.0 g/dL   HCT 38.5 36.0 - 46.0 %   MCV 97.7 80.0 - 100.0 fL   MCH 31.7 26.0 - 34.0 pg   MCHC 32.5 30.0 - 36.0 g/dL   RDW 14.6 11.5 - 15.5 %   Platelets 281 150 - 400 K/uL   nRBC 0.0 0.0 - 0.2 %   Neutrophils Relative % 84 %   Neutro Abs 19.0 (H) 1.7 - 7.7 K/uL   Lymphocytes Relative 7 %   Lymphs Abs 1.6 0.7 - 4.0 K/uL   Monocytes Relative 8 %   Monocytes Absolute 1.7 (H) 0.1 - 1.0 K/uL   Eosinophils Relative 1 %   Eosinophils Absolute 0.1 0.0 - 0.5 K/uL   Basophils Relative 0 %   Basophils Absolute 0.1 0.0 -  0.1 K/uL   Immature Granulocytes 0 %   Abs Immature Granulocytes 0.10 (H) 0.00 - 0.07 K/uL    Comment: Performed at [facility] [address], [place], [place] [address]  Comprehensive metabolic panel     Status: Abnormal   Collection Time: 03/03/21  7:45 PM  Result Value Ref Range   Sodium 134 (L) 135 - 145 mmol/L   Potassium 4.3 3.5 - 5.1 mmol/L   Chloride 100 98 - 111 mmol/L   CO2 27 22 - 32 mmol/L   Glucose, Bld 184 (H) 70 - 99 mg/dL    Comment: Glucose reference range applies only to samples taken after fasting for at least 8 hours.   BUN 21 8 - 23 mg/dL   Creatinine, Ser 1.68 (H) 0.44 - 1.00 mg/dL   Calcium 8.7 (L) 8.9 - 10.3 mg/dL   Total Protein 6.2 (L) 6.5 - 8.1 g/dL   Albumin 2.9 (L) 3.5 - 5.0 g/dL   AST 16 15 - 41 U/L   ALT 10 0 - 44 U/L   Alkaline Phosphatase 72 38 - 126 U/L   Total Bilirubin 0.8 0.3 - 1.2 mg/dL   GFR, Estimated 31 (L) >60 mL/min    Comment: (NOTE) Calculated using the CKD-EPI Creatinine Equation (2021)    Anion gap 7 5 - 15    Comment: Performed at [facility], [place] [address], [place], [place] [address]  Resp Panel by RT-PCR (Flu A&B, Covid) Nasopharyngeal Swab     Status: None   Collection Time: 03/04/21 12:11 AM   Specimen: Nasopharyngeal Swab; Nasopharyngeal(NP) swabs in vial transport medium  Result Value Ref Range   SARS Coronavirus 2 by RT PCR NEGATIVE NEGATIVE    Comment: (NOTE) SARS-CoV-2 target nucleic acids are NOT DETECTED.  The SARS-CoV-2 RNA is generally detectable in upper respiratory specimens during the acute phase of infection. The lowest concentration of SARS-CoV-2 viral copies this assay can detect is 138 copies/mL. A negative result does not preclude SARS-Cov-2 infection and should not be used as the sole basis for treatment or other patient management decisions. A negative result may occur with  improper specimen collection/handling, submission of specimen other than nasopharyngeal swab, presence of viral  mutation(s) within the areas targeted by this assay, and inadequate number of viral copies(<138 copies/mL). A negative result must be combined with clinical observations, patient history, and epidemiological information. The expected result is Negative.  Fact Sheet for Patients:  EntrepreneurPulse.com.au  Fact Sheet for Healthcare Providers:  IncredibleEmployment.be  This test is no t yet approved or cleared by the [place] FDA and  has been authorized for detection  and/or diagnosis of SARS-CoV-2 by FDA under an Emergency Use Authorization (EUA). This EUA will remain  in effect (meaning this test can be used) for the duration of the COVID-19 declaration under Section 564(b)(1) of the Act, 21 U.S.C.section 360bbb-3(b)(1), unless the authorization is terminated  or revoked sooner.       Influenza A by PCR NEGATIVE NEGATIVE   Influenza B by PCR NEGATIVE NEGATIVE    Comment: (NOTE) The Xpert Xpress SARS-CoV-2/FLU/RSV plus assay is intended as an aid in the diagnosis of influenza from Nasopharyngeal swab specimens and should not be used as a sole basis for treatment. Nasal washings and aspirates are unacceptable for Xpert Xpress SARS-CoV-2/FLU/RSV testing.  Fact Sheet for Patients: EntrepreneurPulse.com.au  Fact Sheet for Healthcare Providers: IncredibleEmployment.be  This test is not yet approved or cleared by the [place] FDA and has been authorized for detection and/or diagnosis of SARS-CoV-2 by FDA under an Emergency Use Authorization (EUA). This EUA will remain in effect (meaning this test can be used) for the duration of the COVID-19 declaration under Section 564(b)(1) of the Act, 21 U.S.C. section 360bbb-3(b)(1), unless the authorization is terminated or revoked.  Performed at [facility], [place] [address] [place], [place] [address]   MRSA Next Gen by PCR, Nasal     Status: None    Collection Time: 03/04/21  3:17 AM   Specimen: Nasal Mucosa; Nasal Swab  Result Value Ref Range   MRSA by PCR Next Gen NOT DETECTED NOT DETECTED    Comment: (NOTE) The GeneXpert MRSA Assay (FDA approved for NASAL specimens only), is one component of a comprehensive MRSA colonization surveillance program. It is not intended to diagnose MRSA infection nor to guide or monitor treatment for MRSA infections. Test performance is not FDA approved in patients less than 11 years old. Performed at [facility], [place] [address] [place], [place] [address]   Hemoglobin A1c     Status: Abnormal   Collection Time: 03/04/21  3:55 AM  Result Value Ref Range   Hgb A1c MFr Bld 8.1 (H) 4.8 - 5.6 %    Comment: (NOTE) Pre diabetes:          5.7%-6.4%  Diabetes:              >6.4%  Glycemic control for   <7.0% adults with diabetes    Mean Plasma Glucose 185.77 mg/dL    Comment: Performed at [facility] [address] [place], [place] [address]  CBC     Status: Abnormal   Collection Time: 03/04/21  3:55 AM  Result Value Ref Range   WBC 21.9 (H) 4.0 - 10.5 K/uL   RBC 3.84 (L) 3.87 - 5.11 MIL/uL   Hemoglobin 12.3 12.0 - 15.0 g/dL   HCT 38.0 36.0 - 46.0 %   MCV 99.0 80.0 - 100.0 fL   MCH 32.0 26.0 - 34.0 pg   MCHC 32.4 30.0 - 36.0 g/dL   RDW 14.6 11.5 - 15.5 %   Platelets 259 150 - 400 K/uL   nRBC 0.0 0.0 - 0.2 %    Comment: Performed at [facility], [place] [address] [place], [place] [address]  Basic metabolic panel     Status: Abnormal   Collection Time: 03/04/21  3:55 AM  Result Value Ref Range   Sodium 133 (L) 135 - 145 mmol/L   Potassium 4.5 3.5 - 5.1 mmol/L   Chloride 98 98 - 111 mmol/L   CO2  25 22 - 32 mmol/L   Glucose, Bld 255 (H) 70 - 99 mg/dL    Comment: Glucose reference range applies only to samples taken after fasting for at least 8 hours.   BUN 23 8 - 23 mg/dL   Creatinine, Ser 1.70 (H) 0.44 - 1.00 mg/dL   Calcium 8.6 (L) 8.9 - 10.3 mg/dL   GFR, Estimated  30 (L) >60 mL/min    Comment: (NOTE) Calculated using the CKD-EPI Creatinine Equation (2021)    Anion gap 10 5 - 15    Comment: Performed at [facility] [address], [place], [place] [address]  Magnesium     Status: None   Collection Time: 03/04/21  3:55 AM  Result Value Ref Range   Magnesium 1.8 1.7 - 2.4 mg/dL    Comment: Performed at [facility] [address] [place], [place] [address]  Phosphorus     Status: None   Collection Time: 03/04/21  3:55 AM  Result Value Ref Range   Phosphorus 3.2 2.5 - 4.6 mg/dL    Comment: Performed at [facility] [address] [place], [place] [address]  CBG monitoring, ED     Status: Abnormal   Collection Time: 03/04/21  8:29 AM  Result Value Ref Range   Glucose-Capillary 199 (H) 70 - 99 mg/dL    Comment: Glucose reference range applies only to samples taken after fasting for at least 8 hours.  CBG monitoring, ED     Status: Abnormal   Collection Time: 03/04/21  2:34 PM  Result Value Ref Range   Glucose-Capillary 278 (H) 70 - 99 mg/dL    Comment: Glucose reference range applies only to samples taken after fasting for at least 8 hours.    CT MAXILLOFACIAL WO CONTRAST  Result Date: 03/04/2021 CLINICAL DATA:  Facial wound and abscess. EXAM: CT MAXILLOFACIAL WITHOUT CONTRAST TECHNIQUE: Multidetector CT imaging of the maxillofacial structures was performed. Multiplanar CT image reconstructions were also generated. COMPARISON:  Head CT dated 11/17/2019. FINDINGS: Osseous: No fracture or mandibular dislocation. No destructive process. Orbits: The globes and retro-orbital fat are preserved. Right scleral banding. Sinuses: Clear. Soft tissues: There is diffuse skin thickening and soft tissue swelling of the left side of the face in the region of the left parotid gland with thickening of the left earlobe. No drainable fluid collection or abscess. Several top-normal left cervical lymph nodes, likely reactive. Limited intracranial:  Mild degenerative changes.  No acute findings. IMPRESSION: Left facial cellulitis versus parotiditis. Clinical correlation is recommended. No drainable fluid collection or abscess. Electronically Signed   By: Anner Crete M.D.   On: 03/04/2021 01:31    Review of Systems  All other systems reviewed and are negative. Blood pressure (!) 98/49, pulse 73, temperature 98.4 F (36.9 C), temperature source Oral, resp. rate (!) 21, height 5\' 3"  (1.6 m), weight 80 kg, SpO2 99 %. Physical Exam Constitutional:      Appearance: Normal appearance. She is normal weight.  HENT:     Head: Normocephalic and atraumatic.     Right Ear: External ear normal.     Left Ear: External ear normal.     Nose: Nose normal.     Mouth/Throat:     Mouth: Mucous membranes are moist.     Pharynx: Oropharynx is clear.  Eyes:     Extraocular Movements: Extraocular movements intact.     Conjunctiva/sclera: Conjunctivae normal.     Pupils: Pupils are equal, round, and  reactive to light.  Neck:     Comments: Large area of firm edema/induration of left lateral cheek/upper neck with central area of ulceration, no expressible fluid. Cardiovascular:     Rate and Rhythm: Normal rate.  Pulmonary:     Effort: Pulmonary effort is normal.  Skin:    General: Skin is warm and dry.  Neurological:     General: No focal deficit present.     Mental Status: She is alert and oriented to person, place, and time.     Cranial Nerves: No cranial nerve deficit.  Psychiatric:        Mood and Affect: Mood normal.        Behavior: Behavior normal.        Thought Content: Thought content normal.        Judgment: Judgment normal.    Assessment/Plan: Left facial cellulitis  I personally reviewed her CT scan.  It looks like the infection represents a soft tissue cellulitis as opposed to parotitis.  There does not appear to be a drainable fluid collection at this time.  ID has recommended medical therapy.  Glycemic control is important.   Will follow.  Melida Quitter 03/04/2021, 6:08 PM

## 2021-03-04 NOTE — ED Notes (Signed)
Pt SpO2 dropped to 87% RA. No distress not. Pt denies SOB. Placed on 2 liters with increase to 98%

## 2021-03-04 NOTE — ED Notes (Signed)
Pt wound wrapped. Pt verbalized comfort with wrapping.

## 2021-03-04 NOTE — Consult Note (Addendum)
[facility]    Date of Admission:  03/03/2021     Reason for Consult: rash/cellulitis    Referring Provider: Dyann Kief    Abx: 9/23-c linezolid 9/23-c ceftriaxone         Assessment: 80 yo. 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, hlp/htn, gout, hx mrsa skin soft tissue infection, admitted 9/22 for acute left facial swelling/redness/pain/dischargeyo   Appears to be carbuncle/cellulitis, but appearance and initial sx suggest hsv/vzv process as well  Plan: Send vzv/hsv pcr swab F/u bcx Agree continue linezolid Can stop ceftriaxone Start valtrex 1 gram q8hr Will follow   Discussed with primary team    I spent 60 minute reviewing data/chart, and coordinating care and >50% direct face to face time providing counseling/discussing diagnostics/treatment plan with patient       ------------------------------------------------ Active Problems:   Facial cellulitis    HPI: Sabrina Baker is a 80 y.o. 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, hlp/htn, gout, hx mrsa skin soft tissue infection, admitted 9/22 for acute left facial swelling/redness/pain/discharge  Patient noticed itchy sensation 5 days prior to admission, this quickly evolved into redness, swelling, pain, and ulcerative process She reports feeling well otherwise No subjective f/c  No headache, focal unilateral weakness/numbness, hearing decrement  She had hx (11/2019) scalp mrsa abscess needing abx/debridement  On presentation has low grade fever 100s, leukocytosis 22 Ct face showed parotitis vs cellulitis  No dental issue  Cr slightly worse than baseline  Started on linezolid/ceftriaxone Blood cx sent before abx started  Ent had evaluated; nothing to do surgically from their standpoint  Patient reports hx chicken pox     Fam hx: No recurrent skin infection  Social History   Tobacco Use   Smoking status: Never   Smokeless  tobacco: Never  Vaping Use   Vaping Use: Never used  Substance Use Topics   Alcohol use: No   Drug use: No    Allergies  Allergen Reactions   Ramipril Anaphylaxis and Other (See Comments)   Other Other (See Comments)   Cortisone Itching and Rash   Niacin Diarrhea    And nausea   Niacin And Related Diarrhea   Valsartan Other (See Comments)    Hyperkalemia     Review of Systems: ROS All Other ROS was negative, except mentioned above   Past Medical History:  Diagnosis Date   Carotid stenosis    Carotid US 1/22: Bilateral ICA 1-39; follow-up as needed   Chronic kidney disease    stage 3   Diabetes mellitus without complication (HCC)    Gout    Hypercholesteremia    Hypertension    Neuromuscular disorder (HCC)    neuropathy   Osteopenia    Retinal detachment    TIA (transient ischemic attack)    Vitamin B12 deficiency        Scheduled Meds:  allopurinol  100 mg Oral Daily   amLODipine  10 mg Oral Daily   atorvastatin  10 mg Oral Daily   carvedilol  25 mg Oral BID WC   DULoxetine  60 mg Oral Daily   enoxaparin (LOVENOX) injection  40 mg Subcutaneous QHS   gabapentin  200 mg Oral QHS   insulin aspart  0-5 Units Subcutaneous QHS   insulin aspart  0-9 Units Subcutaneous TID WC   linezolid  600 mg Oral Q12H   Continuous Infusions:  cefTRIAXone (ROCEPHIN)  IV  lactated ringers 50 mL/hr at 03/04/21 0429   PRN Meds:.acetaminophen, bacitracin-polymyxin b, HYDROmorphone (DILAUDID) injection, melatonin, ondansetron (ZOFRAN) IV, oxyCODONE, polyethylene glycol   OBJECTIVE: Blood pressure 133/79, pulse 76, temperature 99.3 F (37.4 C), temperature source Oral, resp. rate 11, height 5\' 3"  (1.6 m), weight 80 kg, SpO2 98 %.  Physical Exam  General/constitutional: no distress, pleasant HEENT: Normocephalic, PER, Conj Clear, EOMI, Oropharynx clear Neck supple CV: rrr no mrg Lungs: clear to auscultation, normal respiratory effort Abd: Soft, Nontender Ext: no  edema Skin: see picture, tender indurated swollen erythema with superimposed eschar/crust and vessicular changes Neuro: nonfocal MSK: no peripheral joint swelling/tenderness/warmth; back spines nontender       Lab Results Lab Results  Component Value Date   WBC 21.9 (H) 03/04/2021   HGB 12.3 03/04/2021   HCT 38.0 03/04/2021   MCV 99.0 03/04/2021   PLT 259 03/04/2021    Lab Results  Component Value Date   CREATININE 1.70 (H) 03/04/2021   BUN 23 03/04/2021   NA 133 (L) 03/04/2021   K 4.5 03/04/2021   CL 98 03/04/2021   CO2 25 03/04/2021    Lab Results  Component Value Date   ALT 10 03/03/2021   AST 16 03/03/2021   ALKPHOS 72 03/03/2021   BILITOT 0.8 03/03/2021      Microbiology: Recent Results (from the past 240 hour(s))  Resp Panel by RT-PCR (Flu A&B, Covid) Nasopharyngeal Swab     Status: None   Collection Time: 03/04/21 12:11 AM   Specimen: Nasopharyngeal Swab; Nasopharyngeal(NP) swabs in vial transport medium  Result Value Ref Range Status   SARS Coronavirus 2 by RT PCR NEGATIVE NEGATIVE Final    Comment: (NOTE) SARS-CoV-2 target nucleic acids are NOT DETECTED.  The SARS-CoV-2 RNA is generally detectable in upper respiratory specimens during the acute phase of infection. The lowest concentration of SARS-CoV-2 viral copies this assay can detect is 138 copies/mL. A negative result does not preclude SARS-Cov-2 infection and should not be used as the sole basis for treatment or other patient management decisions. A negative result may occur with  improper specimen collection/handling, submission of specimen other than nasopharyngeal swab, presence of viral mutation(s) within the areas targeted by this assay, and inadequate number of viral copies(<138 copies/mL). A negative result must be combined with clinical observations, patient history, and epidemiological information. The expected result is Negative.  Fact Sheet for Patients:   EntrepreneurPulse.com.au  Fact Sheet for Healthcare Providers:  IncredibleEmployment.be  This test is no t yet approved or cleared by the [place] FDA and  has been authorized for detection and/or diagnosis of SARS-CoV-2 by FDA under an Emergency Use Authorization (EUA). This EUA will remain  in effect (meaning this test can be used) for the duration of the COVID-19 declaration under Section 564(b)(1) of the Act, 21 U.S.C.section 360bbb-3(b)(1), unless the authorization is terminated  or revoked sooner.       Influenza A by PCR NEGATIVE NEGATIVE Final   Influenza B by PCR NEGATIVE NEGATIVE Final    Comment: (NOTE) The Xpert Xpress SARS-CoV-2/FLU/RSV plus assay is intended as an aid in the diagnosis of influenza from Nasopharyngeal swab specimens and should not be used as a sole basis for treatment. Nasal washings and aspirates are unacceptable for Xpert Xpress SARS-CoV-2/FLU/RSV testing.  Fact Sheet for Patients: EntrepreneurPulse.com.au  Fact Sheet for Healthcare Providers: IncredibleEmployment.be  This test is not yet approved or cleared by the [place] FDA and has been authorized for detection and/or diagnosis of  SARS-CoV-2 by FDA under an Emergency Use Authorization (EUA). This EUA will remain in effect (meaning this test can be used) for the duration of the COVID-19 declaration under Section 564(b)(1) of the Act, 21 U.S.C. section 360bbb-3(b)(1), unless the authorization is terminated or revoked.  Performed at [facility], [place] [address], [place], [place] [address]      Serology:    Imaging: If present, new imagings (plain films, ct scans, and mri) have been personally visualized and interpreted; radiology reports have been reviewed. Decision making incorporated into the Impression / Recommendations.  9/23 ct maxilofacial Left facial cellulitis versus parotiditis. Clinical  correlation is recommended. No drainable fluid collection or abscess.   Jabier Mutton, [facility] [place] [phone] pager    03/04/2021, 2:23 PM

## 2021-03-04 NOTE — ED Provider Notes (Signed)
[facility] Emergency Department Provider Note MRN:  [id number]  Arrival date & time: 03/04/21     Chief Complaint   Wound History of Present Illness   Sabrina Baker is a 80 y.o. year-old female with a history of diabetes presenting to the ED with chief complaint of wound.  Location: Left face Duration: 4 to 5 days Onset: Gradual Timing: Constant, worsening Description: Painful and red wound Severity: Moderate Exacerbating/Alleviating Factors: Feels a bit better with light pressure Associated Symptoms: Fever Pertinent Negatives: Denies nausea vomiting, no trauma, no chest pain or shortness of breath, no abdominal pain  Additional History: Told by primary care doctor to come get IV antibiotics  Review of Systems  A complete 10 system review of systems was obtained and all systems are negative except as noted in the HPI and PMH.   Patient's Health History    Past Medical History:  Diagnosis Date   Carotid stenosis    Carotid US 1/22: Bilateral ICA 1-39; follow-up as needed   Chronic kidney disease    stage 3   Diabetes mellitus without complication (HCC)    Gout    Hypercholesteremia    Hypertension    Neuromuscular disorder (HCC)    neuropathy   Osteopenia    Retinal detachment    TIA (transient ischemic attack)    Vitamin B12 deficiency     Past Surgical History:  Procedure Laterality Date   25 GAUGE PARS PLANA VITRECTOMY WITH 20 GAUGE MVR PORT Right 03/16/2015   Procedure: 25 GAUGE PARS PLANA VITRECTOMY WITH 23 GAUGE MVR PORT;  Surgeon: Hayden Pedro, MD;  Location: [place];  Service: Ophthalmology;  Laterality: Right;   ANKLE SURGERY     BREAST EXCISIONAL BIOPSY     BREAST LUMPECTOMY WITH RADIOACTIVE SEED LOCALIZATION Right 01/24/2016   Procedure: RIGHT BREAST LUMPECTOMY WITH RADIOACTIVE SEED LOCALIZATION;  Surgeon: Coralie Keens, MD;  Location: [place];  Service: General;  Laterality: Right;   EYE SURGERY      detached retna X2   HEEL SPUR SURGERY  2012   INCISION AND DRAINAGE ABSCESS Right 11/21/2019   Procedure: INCISION AND DRAINAGE FOREHEAD & RIGHT EAR ABSCESS;  Surgeon: Jerrell Belfast, MD;  Location: [place];  Service: ENT;  Laterality: Right;   INJECTION OF SILICONE OIL Right 53/07/9922   Procedure: Extraction OF SILICONE OIL;  Surgeon: Hayden Pedro, MD;  Location: [place];  Service: Ophthalmology;  Laterality: Right;   ORIF ANKLE FRACTURE Left 06/17/2014   Procedure: OPEN REDUCTION INTERNAL FIXATION (ORIF) LEFT BIMALLEOLAR ANKLE FRACTURE;  Surgeon: Marianna Payment, MD;  Location: [place];  Service: Orthopedics;  Laterality: Left;   PHOTOCOAGULATION WITH LASER Right 03/16/2015   Procedure: PHOTOCOAGULATION WITH LASER;  Surgeon: Hayden Pedro, MD;  Location: [place];  Service: Ophthalmology;  Laterality: Right;   SHOULDER ARTHROSCOPY Right    plates and screws   VITRECTOMY Right 03/16/2015   WRIST ARTHROPLASTY Left    plates and screws and bone graft    No family history on file.  Social History   Socioeconomic History   Marital status: Divorced    Spouse name: Not on file   Number of children: Not on file   Years of education: Not on file   Highest education level: Not on file  Occupational History   Not on file  Tobacco Use   Smoking status: Never   Smokeless tobacco: Never  Vaping Use   Vaping Use: Never used  Substance and Sexual Activity   Alcohol use: No   Drug use: No   Sexual activity: Never    Birth control/protection: Post-menopausal  Other Topics Concern   Not on file  Social History Narrative   Not on file   Social Determinants of Health   Financial Resource Strain: Not on file  Food Insecurity: Not on file  Transportation Needs: Not on file  Physical Activity: Not on file  Stress: Not on file  Social Connections: Not on file  Intimate Partner Violence: Not on file     Physical Exam   Vitals:   03/04/21 0141 03/04/21 0203  BP: (!) 146/70 (!) 170/77   Pulse: 89 84  Resp: 16 16  Temp:  99.7 F (37.6 C)  SpO2: 92% 97%    CONSTITUTIONAL: Well-appearing, NAD NEURO:  Alert and oriented x 3, no focal deficits EYES:  eyes equal and reactive ENT/NECK:  no LAD, no JVD CARDIO: Regular rate, well-perfused, normal S1 and S2 PULM:  CTAB no wheezing or rhonchi GI/GU:  normal bowel sounds, non-distended, non-tender MSK/SPINE:  No gross deformities, no edema SKIN: Wound to left face with surrounding erythema, see picture below PSYCH:  Appropriate speech and behavior Media Information Document Information  Photos    03/04/2021 02:23  Attached To:  Hospital Encounter on 03/03/21   Source Information  Naomie Crow, Barth Kirks, MD  [facility]   *Additional and/or pertinent findings included in MDM below  Diagnostic and Interventional Summary    EKG Interpretation  Date/Time:    Ventricular Rate:    PR Interval:    QRS Duration:   QT Interval:    QTC Calculation:   R Axis:     Text Interpretation:         Labs Reviewed  CBC WITH DIFFERENTIAL/PLATELET - Abnormal; Notable for the following components:      Result Value   WBC 22.6 (*)    Neutro Abs 19.0 (*)    Monocytes Absolute 1.7 (*)    Abs Immature Granulocytes 0.10 (*)    All other components within normal limits  COMPREHENSIVE METABOLIC PANEL - Abnormal; Notable for the following components:   Sodium 134 (*)    Glucose, Bld 184 (*)    Creatinine, Ser 1.68 (*)    Calcium 8.7 (*)    Total Protein 6.2 (*)    Albumin 2.9 (*)    GFR, Estimated 31 (*)    All other components within normal limits  RESP PANEL BY RT-PCR (FLU A&B, COVID) ARPGX2  CULTURE, BLOOD (ROUTINE X 2)  CULTURE, BLOOD (ROUTINE X 2)  CBC  CREATININE, SERUM    CT MAXILLOFACIAL WO CONTRAST  Final Result      Medications  linezolid (ZYVOX) tablet 600 mg (has no administration in time range)  enoxaparin (LOVENOX) injection 40 mg (has no administration in time range)  cefTRIAXone (ROCEPHIN) 2 g in  sodium chloride 0.9 % 100 mL IVPB (2 g Intravenous New Bag/Given 03/04/21 0234)     Procedures  /  Critical Care Procedures  ED Course and Medical Decision Making  I have reviewed the triage vital signs, the nursing notes, and pertinent available records from the EMR.  Listed above are laboratory and imaging tests that I personally ordered, reviewed, and interpreted and then considered in my medical decision making (see below for details).  Suspect abscess or infected parotid gland, actively draining, surrounding erythema, some streaks of redness going down the neck suggestive of possible early lymphangitis.  Patient  had fever up to 100.5 in the waiting room.  She is nontoxic.  White count 22.  Needs admission for IV antibiotics.  CT scan not revealing any obvious fluid collection that needs drainage however limited without contrast given her history of CKD.  Would consider wound care consultation of some type in the morning.  Excepted for admission by medicine.       Barth Kirks. Sedonia Small, [place] [email or web address]  Final Clinical Impressions(s) / ED Diagnoses     ICD-10-CM   1. Facial cellulitis  G86.282       ED Discharge Orders     None        Discharge Instructions Discussed with and Provided to Patient:   Discharge Instructions   None       Maudie Flakes, MD 03/04/21 [phone]

## 2021-03-04 NOTE — Plan of Care (Signed)
  Problem: Education: Goal: Knowledge of General Education information will improve Description: Including pain rating scale, medication(s)/side effects and non-pharmacologic comfort measures Outcome: Progressing   Problem: Clinical Measurements: Goal: Will remain free from infection Outcome: Progressing Goal: Diagnostic test results will improve Outcome: Progressing Goal: Respiratory complications will improve Outcome: Progressing   Problem: Activity: Goal: Risk for activity intolerance will decrease Outcome: Progressing   Problem: Pain Managment: Goal: General experience of comfort will improve Outcome: Progressing   Problem: Safety: Goal: Ability to remain free from injury will improve Outcome: Progressing

## 2021-03-04 NOTE — ED Notes (Signed)
Wound redressed. Pt verbalized comfort at this time

## 2021-03-04 NOTE — ED Notes (Signed)
Dr. Hall at bedside.

## 2021-03-04 NOTE — Progress Notes (Signed)
Patient seen and examined; admitted after midnight secondary to left facial pain, swelling and redness secondary to cellulitic process.  Hemodynamically stable and in no acute distress.  Able to articulate, eat and swallow without problems.  CT scan demonstrating no loculated abscesses or mass.  There is concern for potential left parotitis as well.  ENT was consulted and pending formal consult note.  Following infectious disease recommendations we will check for HSV and VZV and will treat empirically with Linezolid and and Valtrex.  Please refer to H&P written by Dr. Nevada Crane on 03/04/2021 for further info/details on admission.  Plan: -Follow ENT consultation -Continue antibiotics and antivirals medications as recommended by infectious disease Dr. Marland KitchenFollow culture results -IV fluids, analgesics and supportive care.  Barton Dubois MD [phone]

## 2021-03-04 NOTE — Evaluation (Signed)
Occupational Therapy Evaluation Patient Details Name: Sabrina Baker MRN: [id number] DOB: February 25, 1941 Today's Date: 03/04/2021   History of Present Illness 80 y.o. female with medical history significant for insulin-dependent type 2 diabetes, diabetic polyneuropathy, Charcot foot, CKD 3B, paroxysmal A. fib on Eliquis, hyperlipidemia, hypertension, tophaceous gout, iron deficiency anemia, anemia of chronic disease, chronic anxiety/depression, who presented to [facility] ED from home due to gradually worsening left facial cellulitis.   Clinical Impression   Patient admitted for the diagnosis above.  PTA she lives alone, but has assist for community mobility, and supportive services from friends if needed.  She walks with a SPC, and completes her own ADL/IADL with increased time and effort.  Primary deficit is pain related to her face.  Currently she is supervision for all mobility and ADL completion.        Recommendations for follow up therapy are one component of a multi-disciplinary discharge planning process, led by the attending physician.  Recommendations may be updated based on patient status, additional functional criteria and insurance authorization.   Follow Up Recommendations  No OT follow up    Equipment Recommendations  None recommended by OT    Recommendations for Other Services       Precautions / Restrictions Precautions Precautions: Fall Precaution Comments: watch O2 sats Restrictions Weight Bearing Restrictions: No      Mobility Bed Mobility Overal bed mobility: Modified Independent                  Transfers Overall transfer level: Needs assistance Equipment used: Straight cane Transfers: Sit to/from Stand;Stand Pivot Transfers Sit to Stand: Supervision Stand pivot transfers: Supervision            Balance Overall balance assessment: Needs assistance Sitting-balance support: Feet supported Sitting balance-Leahy Scale: Good     Standing balance  support: Single extremity supported Standing balance-Leahy Scale: Fair                             ADL either performed or assessed with clinical judgement   ADL Overall ADL's : Needs assistance/impaired     Grooming: Wash/dry hands;Supervision/safety;Standing           Upper Body Dressing : Supervision/safety;Standing   Lower Body Dressing: Supervision/safety;Sit to/from stand       Toileting- Water quality scientist and Hygiene: Supervision/safety;Sit to/from stand       Functional mobility during ADLs: Supervision/safety;Cane       Vision Patient Visual Report: No change from baseline       Perception     Praxis      Pertinent Vitals/Pain Pain Assessment: Faces Faces Pain Scale: Hurts little more Pain Location: face Pain Descriptors / Indicators: Tender Pain Intervention(s): Monitored during session     Hand Dominance Right   Extremity/Trunk Assessment Upper Extremity Assessment Upper Extremity Assessment: Overall WFL for tasks assessed   Lower Extremity Assessment Lower Extremity Assessment: Overall WFL for tasks assessed   Cervical / Trunk Assessment Cervical / Trunk Assessment: Kyphotic   Communication Communication Communication: No difficulties   Cognition Arousal/Alertness: Awake/alert Behavior During Therapy: WFL for tasks assessed/performed Overall Cognitive Status: Within Functional Limits for tasks assessed                                     General Comments   O2 dropper to 78% on RA, 93% on 2L  Exercises     Shoulder Instructions      Home Living Family/patient expects to be discharged to:: Private residence Living Arrangements: Alone;Children Available Help at Discharge: Family;Available PRN/intermittently Type of Home: House Home Access: Stairs to enter [employer] of Steps: 5 Entrance Stairs-Rails: None Home Layout: Multi-level;Able to live on main level with bedroom/bathroom      Bathroom Shower/Tub: Tub/shower unit         Home Equipment: Walker - 2 wheels;Cane - single point;Bedside commode;Shower seat;Walker - 4 wheels          Prior Functioning/Environment Level of Independence: Independent with assistive device(s)        Comments: uses the SPC, does not drive.  Independent with ADL/IADL - increased time and effort.        OT Problem List: Decreased activity tolerance;Impaired balance (sitting and/or standing);Pain      OT Treatment/Interventions: Self-care/ADL training;Balance training;Therapeutic activities    OT Goals(Current goals can be found in the care plan section) Acute Rehab OT Goals Patient Stated Goal: Figure out this cellulitis and go back home OT Goal Formulation: With patient Time For Goal Achievement: 03/18/21 Potential to Achieve Goals: Good  OT Frequency: Min 2X/week   Barriers to D/C:    none noted       Co-evaluation              AM-PAC OT "6 Clicks" Daily Activity     Outcome Measure Help from another person eating meals?: None Help from another person taking care of personal grooming?: None Help from another person toileting, which includes using toliet, bedpan, or urinal?: A Little Help from another person bathing (including washing, rinsing, drying)?: A Little Help from another person to put on and taking off regular upper body clothing?: None Help from another person to put on and taking off regular lower body clothing?: A Little 6 Click Score: 21   End of Session Equipment Utilized During Treatment: Other (comment) (cane) Nurse Communication: Mobility status  Activity Tolerance: Patient tolerated treatment well Patient left: in bed;with call bell/phone within reach  OT Visit Diagnosis: Unsteadiness on feet (R26.81);Pain Pain - Right/Left:  (face)                Time: 5170-0174 OT Time Calculation (min): 26 min Charges:  OT General Charges $OT Visit: 1 Visit OT Evaluation $OT Eval Moderate  Complexity: 1 Mod OT Treatments $Self Care/Home Management : 8-22 mins  03/04/2021  RP, OTR/L  Acute Rehabilitation Services  Office:  [phone]   Metta Clines 03/04/2021, 10:20 AM

## 2021-03-04 NOTE — ED Notes (Signed)
Pt sitting up eating breakfast tray 

## 2021-03-04 NOTE — Consult Note (Signed)
WOC Nurse Consult Note: Patient receiving care in [facility] ED 27. Reason for Consult: left facial wound Wound type: unclear etiology at this time. See CT report from today for results. Pressure Injury POA: Yes/No/NA Measurement: Wound bed: see photo Drainage (amount, consistency, odor)  Periwound: Dressing procedure/placement/frequency: I discussed with Dr. Reino Bellis that I am concerned this may be some type of fungating tumor. He has consulted Dr. Redmond Baseman, ENT, and will follow their recommendations.  I am signing the [place] service off.  Please reconsult if needed.  [place] nurse will not follow at this time.  Please re-consult the [place] team if needed.  Val Riles, RN, MSN, CWOCN, CNS-BC, pager [phone]

## 2021-03-05 DIAGNOSIS — L03211 Cellulitis of face: Secondary | ICD-10-CM | POA: Diagnosis not present

## 2021-03-05 LAB — CBC
HCT: 32.2 % — ABNORMAL LOW (ref 36.0–46.0)
Hemoglobin: 10.1 g/dL — ABNORMAL LOW (ref 12.0–15.0)
MCH: 30.9 pg (ref 26.0–34.0)
MCHC: 31.4 g/dL (ref 30.0–36.0)
MCV: 98.5 fL (ref 80.0–100.0)
Platelets: 244 10*3/uL (ref 150–400)
RBC: 3.27 MIL/uL — ABNORMAL LOW (ref 3.87–5.11)
RDW: 14.3 % (ref 11.5–15.5)
WBC: 14.2 10*3/uL — ABNORMAL HIGH (ref 4.0–10.5)
nRBC: 0 % (ref 0.0–0.2)

## 2021-03-05 LAB — GLUCOSE, CAPILLARY
Glucose-Capillary: 115 mg/dL — ABNORMAL HIGH (ref 70–99)
Glucose-Capillary: 335 mg/dL — ABNORMAL HIGH (ref 70–99)
Glucose-Capillary: 251 mg/dL — ABNORMAL HIGH (ref 70–99)
Glucose-Capillary: 220 mg/dL — ABNORMAL HIGH (ref 70–99)

## 2021-03-05 MED ORDER — INSULIN GLARGINE-YFGN 100 UNIT/ML ~~LOC~~ SOLN
20.0000 [IU] | Freq: Every day | SUBCUTANEOUS | Status: DC
  Administered 2021-03-05 – 2021-03-07 (×3): 20 [IU] via SUBCUTANEOUS
  Filled 2021-03-05 (×4): qty 0.2

## 2021-03-05 MED ORDER — ENOXAPARIN SODIUM 30 MG/0.3ML IJ SOSY
30.0000 mg | PREFILLED_SYRINGE | Freq: Every day | INTRAMUSCULAR | Status: DC
  Administered 2021-03-05: 30 mg via SUBCUTANEOUS
  Filled 2021-03-05: qty 0.3

## 2021-03-05 NOTE — Progress Notes (Signed)
[facility] for Infectious Disease  Date of Admission:  03/03/2021       Lines: peripheral  Abx: 9/23-c valacyclovir 9/23-c linezolid  ASSESSMENT: Left facial cellulitis  Awaiting vzv/hsv pcr Reviewed ent note, who agree this is not parotitis  PLAN: Continue linezolid Continue valacyclovir Follow up on swab hsv/vzv pcr Discussed with primary team   I spent more than 35 minute reviewing data/chart, and coordinating care and >50% direct face to face time providing counseling/discussing diagnostics/treatment plan with patient   Active Problems:   Facial cellulitis   Allergies  Allergen Reactions   Ramipril Anaphylaxis and Other (See Comments)   Other Other (See Comments)   Cortisone Itching and Rash   Niacin Diarrhea    And nausea   Niacin And Related Diarrhea   Valsartan Other (See Comments)    Hyperkalemia     Scheduled Meds:  allopurinol  100 mg Oral Daily   amLODipine  10 mg Oral Daily   atorvastatin  10 mg Oral Daily   carvedilol  25 mg Oral BID WC   DULoxetine  60 mg Oral Daily   enoxaparin (LOVENOX) injection  30 mg Subcutaneous QHS   gabapentin  200 mg Oral QHS   insulin aspart  0-5 Units Subcutaneous QHS   insulin aspart  0-9 Units Subcutaneous TID WC   insulin glargine-yfgn  20 Units Subcutaneous QHS   linezolid  600 mg Oral Q12H   valACYclovir  1,000 mg Oral Daily   Continuous Infusions:  lactated ringers 50 mL/hr at 03/04/21 1858   PRN Meds:.acetaminophen, bacitracin-polymyxin b, HYDROmorphone (DILAUDID) injection, melatonin, oxyCODONE, polyethylene glycol   SUBJECTIVE: Severe pain left face Shock like sensation when she rub over it Fever resolved Wbc improved  On linezolid/valacyclovir Swab sent 9/23 for vzv/hsv Bcx ngtd    Review of Systems: ROS All other ROS was negative, except mentioned above     OBJECTIVE: Vitals:   03/04/21 1856 03/04/21 2224 03/05/21 0457 03/05/21 0939  BP: (!) 153/77 (!) 148/66  112/69 (!) 138/59  Pulse: 79 76 65 65  Resp: 18 20 17 18   Temp: 98.5 F (36.9 C) 98.6 F (37 C) (!) 97.5 F (36.4 C) (!) 97.5 F (36.4 C)  TempSrc: Oral Oral Oral Oral  SpO2: 96% 97% 92% 92%  Weight: 72.4 kg     Height: 5\' 3"  (1.6 m)      Body mass index is 28.27 kg/m.  Physical Exam  General/constitutional: no distress, pleasant HEENT: Normocephalic, PER, Conj Clear, EOMI, Oropharynx clear Neck supple CV: rrr no mrg Lungs: clear to auscultation, normal respiratory effort Abd: Soft, Nontender Ext: no edema Skin: stable appearing swollen left face with macerated/erythematous center and vessicular changes that had broken; tender to touch Neuro: nonfocal MSK: no peripheral joint swelling/tenderness/warmth; back spines nontender    Lab Results Lab Results  Component Value Date   WBC 14.2 (H) 03/05/2021   HGB 10.1 (L) 03/05/2021   HCT 32.2 (L) 03/05/2021   MCV 98.5 03/05/2021   PLT 244 03/05/2021    Lab Results  Component Value Date   CREATININE 1.70 (H) 03/04/2021   BUN 23 03/04/2021   NA 133 (L) 03/04/2021   K 4.5 03/04/2021   CL 98 03/04/2021   CO2 25 03/04/2021    Lab Results  Component Value Date   ALT 10 03/03/2021   AST 16 03/03/2021   ALKPHOS 72 03/03/2021   BILITOT 0.8 03/03/2021      Microbiology:  Recent Results (from the past 240 hour(s))  Resp Panel by RT-PCR (Flu A&B, Covid) Nasopharyngeal Swab     Status: None   Collection Time: 03/04/21 12:11 AM   Specimen: Nasopharyngeal Swab; Nasopharyngeal(NP) swabs in vial transport medium  Result Value Ref Range Status   SARS Coronavirus 2 by RT PCR NEGATIVE NEGATIVE Final    Comment: (NOTE) SARS-CoV-2 target nucleic acids are NOT DETECTED.  The SARS-CoV-2 RNA is generally detectable in upper respiratory specimens during the acute phase of infection. The lowest concentration of SARS-CoV-2 viral copies this assay can detect is 138 copies/mL. A negative result does not preclude SARS-Cov-2 infection  and should not be used as the sole basis for treatment or other patient management decisions. A negative result may occur with  improper specimen collection/handling, submission of specimen other than nasopharyngeal swab, presence of viral mutation(s) within the areas targeted by this assay, and inadequate number of viral copies(<138 copies/mL). A negative result must be combined with clinical observations, patient history, and epidemiological information. The expected result is Negative.  Fact Sheet for Patients:  EntrepreneurPulse.com.au  Fact Sheet for Healthcare Providers:  IncredibleEmployment.be  This test is no t yet approved or cleared by the [place] FDA and  has been authorized for detection and/or diagnosis of SARS-CoV-2 by FDA under an Emergency Use Authorization (EUA). This EUA will remain  in effect (meaning this test can be used) for the duration of the COVID-19 declaration under Section 564(b)(1) of the Act, 21 U.S.C.section 360bbb-3(b)(1), unless the authorization is terminated  or revoked sooner.       Influenza A by PCR NEGATIVE NEGATIVE Final   Influenza B by PCR NEGATIVE NEGATIVE Final    Comment: (NOTE) The Xpert Xpress SARS-CoV-2/FLU/RSV plus assay is intended as an aid in the diagnosis of influenza from Nasopharyngeal swab specimens and should not be used as a sole basis for treatment. Nasal washings and aspirates are unacceptable for Xpert Xpress SARS-CoV-2/FLU/RSV testing.  Fact Sheet for Patients: EntrepreneurPulse.com.au  Fact Sheet for Healthcare Providers: IncredibleEmployment.be  This test is not yet approved or cleared by the [place] FDA and has been authorized for detection and/or diagnosis of SARS-CoV-2 by FDA under an Emergency Use Authorization (EUA). This EUA will remain in effect (meaning this test can be used) for the duration of the COVID-19 declaration  under Section 564(b)(1) of the Act, 21 U.S.C. section 360bbb-3(b)(1), unless the authorization is terminated or revoked.  Performed at [facility], [place] [address] [place], [place] [address]   Blood culture (routine x 2)     Status: None (Preliminary result)   Collection Time: 03/04/21  1:00 AM   Specimen: BLOOD  Result Value Ref Range Status   Specimen Description BLOOD SITE NOT SPECIFIED  Final   Special Requests AEROBIC BOTTLE ONLY Blood Culture adequate volume  Final   Culture   Final    NO GROWTH 1 DAY Performed at [facility], [place] [address] [place], [place] [address]    Report Status PENDING  Incomplete  Blood culture (routine x 2)     Status: None (Preliminary result)   Collection Time: 03/04/21  1:10 AM   Specimen: BLOOD  Result Value Ref Range Status   Specimen Description BLOOD LEFT ANTECUBITAL  Final   Special Requests   Final    BOTTLES DRAWN AEROBIC AND ANAEROBIC Blood Culture adequate volume   Culture   Final    NO GROWTH 1 DAY Performed at [facility]  Lab, [address], [place], [place] [address]    Report Status PENDING  Incomplete  MRSA Next Gen by PCR, Nasal     Status: None   Collection Time: 03/04/21  3:17 AM   Specimen: Nasal Mucosa; Nasal Swab  Result Value Ref Range Status   MRSA by PCR Next Gen NOT DETECTED NOT DETECTED Final    Comment: (NOTE) The GeneXpert MRSA Assay (FDA approved for NASAL specimens only), is one component of a comprehensive MRSA colonization surveillance program. It is not intended to diagnose MRSA infection nor to guide or monitor treatment for MRSA infections. Test performance is not FDA approved in patients less than 37 years old. Performed at [facility], [place] [address] [place], [place] [address]      Serology:   Imaging: If present, new imagings (plain films, ct scans, and mri) have been personally visualized and interpreted; radiology reports have been reviewed. Decision making  incorporated into the Impression / Recommendations.   Jabier Mutton, [facility] for Infectious [place] [phone] pager    03/05/2021, 3:36 PM

## 2021-03-05 NOTE — Plan of Care (Signed)
  Problem: Activity: Goal: Risk for activity intolerance will decrease Outcome: Progressing   

## 2021-03-05 NOTE — Evaluation (Signed)
Physical Therapy Evaluation Patient Details Name: Sabrina Baker MRN: [id number] DOB: 11-21-1940 Today's Date: 03/05/2021  History of Present Illness  80 y.o. female with medical history significant for insulin-dependent type 2 diabetes, diabetic polyneuropathy, Charcot foot, CKD 3B, paroxysmal A. fib on Eliquis, hyperlipidemia, hypertension, tophaceous gout, iron deficiency anemia, anemia of chronic disease, chronic anxiety/depression, who presented to [facility] ED from home due to gradually worsening left facial cellulitis.  Clinical Impression  PTA, pt lives with her son, ambulates uses a cane, and is independent with ADL's. Pt presents with decreased functional mobility secondary to balance deficits, decreased activity tolerance, and weakness. Pt ambulating 80 ft x 2 with a cane at a min guard assist level. Required seated rest break in between bouts. Will benefit from acute PT to address deficits and maximize functional mobility.      Recommendations for follow up therapy are one component of a multi-disciplinary discharge planning process, led by the attending physician.  Recommendations may be updated based on patient status, additional functional criteria and insurance authorization.  Follow Up Recommendations No PT follow up;Supervision for mobility/OOB (pt declining HHPT)    Equipment Recommendations  None recommended by PT    Recommendations for Other Services       Precautions / Restrictions Precautions Precautions: Fall Precaution Comments: watch O2 sats Restrictions Weight Bearing Restrictions: No      Mobility  Bed Mobility Overal bed mobility: Modified Independent                  Transfers Overall transfer level: Needs assistance Equipment used: Straight cane Transfers: Sit to/from Stand Sit to Stand: Min guard;Min assist         General transfer comment: Min guard to rise from bed/chair, light minA from low surface of  toilet  Ambulation/Gait Ambulation/Gait assistance: Min guard Gait Distance (Feet): 160 Feet (80 ft x 2) Assistive device: Straight cane Gait Pattern/deviations: Step-through pattern;Decreased stride length;Wide base of support;Decreased dorsiflexion - right;Decreased dorsiflexion - left Gait velocity: decreased Gait velocity interpretation: <1.8 ft/sec, indicate of risk for recurrent falls General Gait Details: Pt utilizing wider BOS with increased bilateral foot external rotation, mild dynamic instability requiring min guard assist  Stairs            Wheelchair Mobility    Modified Rankin (Stroke Patients Only)       Balance Overall balance assessment: Needs assistance Sitting-balance support: Feet supported Sitting balance-Leahy Scale: Good     Standing balance support: Single extremity supported Standing balance-Leahy Scale: Fair                               Pertinent Vitals/Pain Pain Assessment: Faces Faces Pain Scale: Hurts a little bit Pain Location: face Pain Descriptors / Indicators: Tender Pain Intervention(s): Monitored during session    Home Living Family/patient expects to be discharged to:: Private residence Living Arrangements: Children (son) Available Help at Discharge: Family;Friend(s);Available PRN/intermittently Type of Home: House Home Access: Stairs to enter Entrance Stairs-Rails: None Entrance Stairs-Number of Steps: 5 Home Layout: Multi-level;Able to live on main level with bedroom/bathroom Home Equipment: Gilford Rile - 2 wheels;Cane - single point;Bedside commode;Shower seat;Walker - 4 wheels      Prior Function Level of Independence: Independent with assistive device(s)         Comments: uses the SPC, does not drive.  Independent with ADL/IADL - increased time and effort.     Hand Dominance   Dominant Hand: Right  Extremity/Trunk Assessment   Upper Extremity Assessment Upper Extremity Assessment: Defer to OT  evaluation    Lower Extremity Assessment Lower Extremity Assessment: Generalized weakness;RLE deficits/detail;LLE deficits/detail RLE Deficits / Details: history of Charcot foot LLE Deficits / Details: history of Charcot foot    Cervical / Trunk Assessment Cervical / Trunk Assessment: Kyphotic  Communication   Communication: No difficulties  Cognition Arousal/Alertness: Awake/alert Behavior During Therapy: WFL for tasks assessed/performed Overall Cognitive Status: Within Functional Limits for tasks assessed                                        General Comments      Exercises     Assessment/Plan    PT Assessment Patient needs continued PT services  PT Problem List Decreased strength;Decreased activity tolerance;Decreased balance;Decreased mobility       PT Treatment Interventions DME instruction;Stair training;Gait training;Functional mobility training;Therapeutic activities;Therapeutic exercise;Balance training;Patient/family education    PT Goals (Current goals can be found in the Care Plan section)  Acute Rehab PT Goals Patient Stated Goal: Figure out this cellulitis and go back home, participate in church volunteer events PT Goal Formulation: With patient Time For Goal Achievement: 03/19/21 Potential to Achieve Goals: Good    Frequency Min 3X/week   Barriers to discharge        Co-evaluation               AM-PAC PT "6 Clicks" Mobility  Outcome Measure Help needed turning from your back to your side while in a flat bed without using bedrails?: None Help needed moving from lying on your back to sitting on the side of a flat bed without using bedrails?: A Little Help needed moving to and from a bed to a chair (including a wheelchair)?: A Little Help needed standing up from a chair using your arms (e.g., wheelchair or bedside chair)?: A Little Help needed to walk in hospital room?: A Little Help needed climbing 3-5 steps with a railing? :  A Lot 6 Click Score: 18    End of Session Equipment Utilized During Treatment: Gait belt Activity Tolerance: Patient tolerated treatment well Patient left: in chair;with call bell/phone within reach;with chair alarm set Nurse Communication: Mobility status PT Visit Diagnosis: Unsteadiness on feet (R26.81);Muscle weakness (generalized) (M62.81)    Time: 7616-0737 PT Time Calculation (min) (ACUTE ONLY): 39 min   Charges:   PT Evaluation $PT Eval Moderate Complexity: 1 Mod PT Treatments $Therapeutic Activity: 23-37 mins        Wyona Almas, PT, DPT Acute Rehabilitation Services Pager [phone] Office [phone]   Deno Etienne 03/05/2021, 9:41 AM

## 2021-03-05 NOTE — Progress Notes (Signed)
PROGRESS NOTE    Sabrina Baker  SEG:[id number] DOB: Aug 13, 1940 DOA: 03/03/2021 PCP: [name], MD    Brief Narrative:  80 year old female with history of insulin-dependent type 2 diabetes, diabetic colonopathy, Charcot foot, CKD stage IIIb, paroxysmal A. fib on Eliquis, hypertension hyperlipidemia, chronic anxiety depression, history of facial cellulitis due to MRSA admitted with 3 days of swelling erythema and purulent drainage from left cheek.  In the emergency room temperature 100.5, respiration 23, WBC 22.6 with evidence of cellulitis.  CT maxillofacial with facial cellulitis versus parotiditis.  Admitted with ID and ENT consultation.   Assessment & Plan:   Active Problems:   Facial cellulitis  Sepsis secondary to left facial cellulitis with purulent drainage, present on admission, suspected MRSA infection with previous MRSA facial infection. Sepsis physiology improved. Currently remains on IV linezolid and Valtrex.  Followed by infectious disease.  Swab and cultures pending. Seen by ENT recommends conservative management.  They do not recommend surgical need.  We will continue to follow for improvement.  AKI on CKD stage IIIb: Suspect prerenal in the setting of dehydration.  On IV fluid.  Monitor renal functions closely.  Type 2 diabetes with hyperglycemia: A1c 8.  1.  Blood sugars elevated. On sliding scale insulin.  Appetite is fair. Resume long-acting insulin, 20 units that she takes at home.  Essential hypertension Home blood pressure fairly stable on home medications.  GERD: On PPI.  Paroxysmal A. fib on Eliquis: Eliquis on hold.  Rate controlled with Coreg.  Currently sinus rhythm. Will continue to hold Eliquis due to significant swelling and possible need for surgery.  No indication to bridge with heparin.    DVT prophylaxis: enoxaparin (LOVENOX) injection 30 mg Start: 03/05/21 2200 SCDs Start: 03/04/21 0244   Code Status: Full code Family Communication:  None Disposition Plan: Status is: Inpatient  Remains inpatient appropriate because:Inpatient level of care appropriate due to severity of illness  Dispo:  Patient From: Home  Planned Disposition: Home  Medically stable for discharge: No           Consultants:  ENT Infectious disease  Procedures:  None  Antimicrobials:  Rocephin and Zyvox 9/23--9/24 Zyvox 10/23----   Subjective: Patient seen and examined.  She was working with physical therapist trying to walk around.  No overnight events.  Has moderate pain on the left cheek.  Swelling persist.  Denies any difficulty swallowing or difficulty breathing.  Objective: Vitals:   03/04/21 1856 03/04/21 2224 03/05/21 0457 03/05/21 0939  BP: (!) 153/77 (!) 148/66 112/69 (!) 138/59  Pulse: 79 76 65 65  Resp: 18 20 17 18   Temp: 98.5 F (36.9 C) 98.6 F (37 C) (!) 97.5 F (36.4 C) (!) 97.5 F (36.4 C)  TempSrc: Oral Oral Oral Oral  SpO2: 96% 97% 92% 92%  Weight: 72.4 kg     Height: 5\' 3"  (1.6 m)       Intake/Output Summary (Last 24 hours) at 03/05/2021 1438 Last data filed at 03/05/2021 0630 Gross per 24 hour  Intake 1520.2 ml  Output 600 ml  Net 920.2 ml   Filed Weights   03/03/21 1715 03/03/21 1930 03/04/21 1856  Weight: 72.6 kg 80 kg 72.4 kg    Examination:  General: Looks fairly comfortable.  Walking around in the room air.  She does have difficulty mobilizing and uses walker at home due to her Charcot joints.  On room air. Cardiovascular: S1-S2 normal.  Regular rate rhythm. Respiratory: Bilateral clear.  No added sounds. Gastrointestinal:  Soft and nontender.  Bowel sounds present. Ext: No edema. Diffusely swollen left cheek with opening in the central, no pus drainage.  No fluctuation.     Data Reviewed: I have personally reviewed following labs and imaging studies  CBC: Recent Labs  Lab 03/03/21 1945 03/04/21 0355 03/05/21 0312  WBC 22.6* 21.9* 14.2*  NEUTROABS 19.0*  --   --   HGB 12.5 12.3  10.1*  HCT 38.5 38.0 32.2*  MCV 97.7 99.0 98.5  PLT 281 259 381   Basic Metabolic Panel: Recent Labs  Lab 03/03/21 1945 03/04/21 0355  NA 134* 133*  K 4.3 4.5  CL 100 98  CO2 27 25  GLUCOSE 184* 255*  BUN 21 23  CREATININE 1.68* 1.70*  CALCIUM 8.7* 8.6*  MG  --  1.8  PHOS  --  3.2   GFR: Estimated Creatinine Clearance: 25.6 mL/min (A) (by C-G formula based on SCr of 1.7 mg/dL (H)). Liver Function Tests: Recent Labs  Lab 03/03/21 1945  AST 16  ALT 10  ALKPHOS 72  BILITOT 0.8  PROT 6.2*  ALBUMIN 2.9*   No results for input(s): LIPASE, AMYLASE in the last 168 hours. No results for input(s): AMMONIA in the last 168 hours. Coagulation Profile: No results for input(s): INR, PROTIME in the last 168 hours. Cardiac Enzymes: No results for input(s): CKTOTAL, CKMB, CKMBINDEX, TROPONINI in the last 168 hours. BNP (last 3 results) No results for input(s): PROBNP in the last 8760 hours. HbA1C: Recent Labs    03/04/21 0355  HGBA1C 8.1*   CBG: Recent Labs  Lab 03/04/21 0829 03/04/21 1434 03/05/21 0734 03/05/21 1135  GLUCAP 199* 278* 115* 335*   Lipid Profile: No results for input(s): CHOL, HDL, LDLCALC, TRIG, CHOLHDL, LDLDIRECT in the last 72 hours. Thyroid Function Tests: No results for input(s): TSH, T4TOTAL, FREET4, T3FREE, THYROIDAB in the last 72 hours. Anemia Panel: No results for input(s): VITAMINB12, FOLATE, FERRITIN, TIBC, IRON, RETICCTPCT in the last 72 hours. Sepsis Labs: No results for input(s): PROCALCITON, LATICACIDVEN in the last 168 hours.  Recent Results (from the past 240 hour(s))  Resp Panel by RT-PCR (Flu A&B, Covid) Nasopharyngeal Swab     Status: None   Collection Time: 03/04/21 12:11 AM   Specimen: Nasopharyngeal Swab; Nasopharyngeal(NP) swabs in vial transport medium  Result Value Ref Range Status   SARS Coronavirus 2 by RT PCR NEGATIVE NEGATIVE Final    Comment: (NOTE) SARS-CoV-2 target nucleic acids are NOT DETECTED.  The SARS-CoV-2  RNA is generally detectable in upper respiratory specimens during the acute phase of infection. The lowest concentration of SARS-CoV-2 viral copies this assay can detect is 138 copies/mL. A negative result does not preclude SARS-Cov-2 infection and should not be used as the sole basis for treatment or other patient management decisions. A negative result may occur with  improper specimen collection/handling, submission of specimen other than nasopharyngeal swab, presence of viral mutation(s) within the areas targeted by this assay, and inadequate number of viral copies(<138 copies/mL). A negative result must be combined with clinical observations, patient history, and epidemiological information. The expected result is Negative.  Fact Sheet for Patients:  EntrepreneurPulse.com.au  Fact Sheet for Healthcare Providers:  IncredibleEmployment.be  This test is no t yet approved or cleared by the [place] FDA and  has been authorized for detection and/or diagnosis of SARS-CoV-2 by FDA under an Emergency Use Authorization (EUA). This EUA will remain  in effect (meaning this test can be used) for the duration of  the COVID-19 declaration under Section 564(b)(1) of the Act, 21 U.S.C.section 360bbb-3(b)(1), unless the authorization is terminated  or revoked sooner.       Influenza A by PCR NEGATIVE NEGATIVE Final   Influenza B by PCR NEGATIVE NEGATIVE Final    Comment: (NOTE) The Xpert Xpress SARS-CoV-2/FLU/RSV plus assay is intended as an aid in the diagnosis of influenza from Nasopharyngeal swab specimens and should not be used as a sole basis for treatment. Nasal washings and aspirates are unacceptable for Xpert Xpress SARS-CoV-2/FLU/RSV testing.  Fact Sheet for Patients: EntrepreneurPulse.com.au  Fact Sheet for Healthcare Providers: IncredibleEmployment.be  This test is not yet approved or cleared by the  [place] FDA and has been authorized for detection and/or diagnosis of SARS-CoV-2 by FDA under an Emergency Use Authorization (EUA). This EUA will remain in effect (meaning this test can be used) for the duration of the COVID-19 declaration under Section 564(b)(1) of the Act, 21 U.S.C. section 360bbb-3(b)(1), unless the authorization is terminated or revoked.  Performed at [facility], [place] [address] [place], [place] [address]   Blood culture (routine x 2)     Status: None (Preliminary result)   Collection Time: 03/04/21  1:00 AM   Specimen: BLOOD  Result Value Ref Range Status   Specimen Description BLOOD SITE NOT SPECIFIED  Final   Special Requests AEROBIC BOTTLE ONLY Blood Culture adequate volume  Final   Culture   Final    NO GROWTH 1 DAY Performed at [facility], [place] [address], [place], [place] [address]    Report Status PENDING  Incomplete  Blood culture (routine x 2)     Status: None (Preliminary result)   Collection Time: 03/04/21  1:10 AM   Specimen: BLOOD  Result Value Ref Range Status   Specimen Description BLOOD LEFT ANTECUBITAL  Final   Special Requests   Final    BOTTLES DRAWN AEROBIC AND ANAEROBIC Blood Culture adequate volume   Culture   Final    NO GROWTH 1 DAY Performed at [facility], [place] [address], [place], [place] [address]    Report Status PENDING  Incomplete  MRSA Next Gen by PCR, Nasal     Status: None   Collection Time: 03/04/21  3:17 AM   Specimen: Nasal Mucosa; Nasal Swab  Result Value Ref Range Status   MRSA by PCR Next Gen NOT DETECTED NOT DETECTED Final    Comment: (NOTE) The GeneXpert MRSA Assay (FDA approved for NASAL specimens only), is one component of a comprehensive MRSA colonization surveillance program. It is not intended to diagnose MRSA infection nor to guide or monitor treatment for MRSA infections. Test performance is not FDA approved in patients less than 40 years old. Performed at [place] [place] [address], [place], [place] [address]          Radiology Studies: CT MAXILLOFACIAL WO CONTRAST  Result Date: 03/04/2021 CLINICAL DATA:  Facial wound and abscess. EXAM: CT MAXILLOFACIAL WITHOUT CONTRAST TECHNIQUE: Multidetector CT imaging of the maxillofacial structures was performed. Multiplanar CT image reconstructions were also generated. COMPARISON:  Head CT dated 11/17/2019. FINDINGS: Osseous: No fracture or mandibular dislocation. No destructive process. Orbits: The globes and retro-orbital fat are preserved. Right scleral banding. Sinuses: Clear. Soft tissues: There is diffuse skin thickening and soft tissue swelling of the left side of the face in the region of the left parotid gland with thickening of the left earlobe. No drainable fluid collection or abscess.  Several top-normal left cervical lymph nodes, likely reactive. Limited intracranial: Mild degenerative changes.  No acute findings. IMPRESSION: Left facial cellulitis versus parotiditis. Clinical correlation is recommended. No drainable fluid collection or abscess. Electronically Signed   By: Anner Crete M.D.   On: 03/04/2021 01:31        Scheduled Meds:  allopurinol  100 mg Oral Daily   amLODipine  10 mg Oral Daily   atorvastatin  10 mg Oral Daily   carvedilol  25 mg Oral BID WC   DULoxetine  60 mg Oral Daily   enoxaparin (LOVENOX) injection  30 mg Subcutaneous QHS   gabapentin  200 mg Oral QHS   insulin aspart  0-5 Units Subcutaneous QHS   insulin aspart  0-9 Units Subcutaneous TID WC   insulin glargine-yfgn  20 Units Subcutaneous QHS   linezolid  600 mg Oral Q12H   valACYclovir  1,000 mg Oral Daily   Continuous Infusions:  lactated ringers 50 mL/hr at 03/04/21 1858     LOS: 1 day    Time spent: 30 minutes    Barb Merino, MD [facility] Pager [phone]

## 2021-03-06 DIAGNOSIS — L03211 Cellulitis of face: Secondary | ICD-10-CM | POA: Diagnosis not present

## 2021-03-06 LAB — BASIC METABOLIC PANEL
Anion gap: 7 (ref 5–15)
BUN: 23 mg/dL (ref 8–23)
CO2: 23 mmol/L (ref 22–32)
Calcium: 8.6 mg/dL — ABNORMAL LOW (ref 8.9–10.3)
Chloride: 98 mmol/L (ref 98–111)
Creatinine, Ser: 1.72 mg/dL — ABNORMAL HIGH (ref 0.44–1.00)
GFR, Estimated: 30 mL/min — ABNORMAL LOW (ref >60)
Glucose, Bld: 177 mg/dL — ABNORMAL HIGH (ref 70–99)
Potassium: 4.8 mmol/L (ref 3.5–5.1)
Sodium: 128 mmol/L — ABNORMAL LOW (ref 135–145)

## 2021-03-06 LAB — CBC WITH DIFFERENTIAL/PLATELET
Abs Immature Granulocytes: 0.15 10*3/uL — ABNORMAL HIGH (ref 0.00–0.07)
Basophils Absolute: 0 10*3/uL (ref 0.0–0.1)
Basophils Relative: 0 %
Eosinophils Absolute: 0.4 10*3/uL (ref 0.0–0.5)
Eosinophils Relative: 3 %
HCT: 34.7 % — ABNORMAL LOW (ref 36.0–46.0)
Hemoglobin: 11.1 g/dL — ABNORMAL LOW (ref 12.0–15.0)
Immature Granulocytes: 1 %
Lymphocytes Relative: 8 %
Lymphs Abs: 1.1 10*3/uL (ref 0.7–4.0)
MCH: 31.3 pg (ref 26.0–34.0)
MCHC: 32 g/dL (ref 30.0–36.0)
MCV: 97.7 fL (ref 80.0–100.0)
Monocytes Absolute: 1.2 10*3/uL — ABNORMAL HIGH (ref 0.1–1.0)
Monocytes Relative: 9 %
Neutro Abs: 11 10*3/uL — ABNORMAL HIGH (ref 1.7–7.7)
Neutrophils Relative %: 79 %
Platelets: 290 10*3/uL (ref 150–400)
RBC: 3.55 MIL/uL — ABNORMAL LOW (ref 3.87–5.11)
RDW: 14.5 % (ref 11.5–15.5)
WBC: 13.9 10*3/uL — ABNORMAL HIGH (ref 4.0–10.5)
nRBC: 0 % (ref 0.0–0.2)

## 2021-03-06 LAB — MAGNESIUM: Magnesium: 1.7 mg/dL (ref 1.7–2.4)

## 2021-03-06 LAB — GLUCOSE, CAPILLARY
Glucose-Capillary: 172 mg/dL — ABNORMAL HIGH (ref 70–99)
Glucose-Capillary: 239 mg/dL — ABNORMAL HIGH (ref 70–99)
Glucose-Capillary: 113 mg/dL — ABNORMAL HIGH (ref 70–99)
Glucose-Capillary: 134 mg/dL — ABNORMAL HIGH (ref 70–99)

## 2021-03-06 LAB — VARICELLA-ZOSTER BY PCR: Varicella-Zoster, PCR: NEGATIVE

## 2021-03-06 MED ORDER — COLCHICINE 0.3 MG HALF TABLET
0.3000 mg | ORAL_TABLET | Freq: Every day | ORAL | Status: DC
  Administered 2021-03-06 – 2021-03-09 (×4): 0.3 mg via ORAL
  Filled 2021-03-06 (×2): qty 1
  Filled 2021-03-06: qty 0.5
  Filled 2021-03-06 (×2): qty 1

## 2021-03-06 MED ORDER — APIXABAN 5 MG PO TABS
5.0000 mg | ORAL_TABLET | Freq: Two times a day (BID) | ORAL | Status: DC
  Administered 2021-03-06 – 2021-03-23 (×35): 5 mg via ORAL
  Filled 2021-03-06 (×38): qty 1

## 2021-03-06 NOTE — Progress Notes (Signed)
PROGRESS NOTE    Sabrina Baker  TFT:[id number] DOB: Mar 14, 1941 DOA: 03/03/2021 PCP: [name], MD    Brief Narrative:  80 year old female with history of insulin-dependent type 2 diabetes, diabetic colonopathy, Charcot foot, CKD stage IIIb, paroxysmal A. fib on Eliquis, hypertension hyperlipidemia, chronic anxiety depression, history of facial cellulitis due to MRSA admitted with 3 days of swelling erythema and purulent drainage from left cheek.  In the emergency room temperature 100.5, respiration 23, WBC 22.6 with evidence of cellulitis.  CT maxillofacial with facial cellulitis versus parotiditis.  Admitted with ID and ENT consultation.   Assessment & Plan:   Active Problems:   Facial cellulitis  Sepsis secondary to left facial cellulitis with purulent drainage, present on admission, suspected MRSA infection with previous MRSA facial infection. Sepsis physiology improved. Currently remains on IV linezolid and Valtrex.  Followed by infectious disease.  Swab and cultures pending. Seen by ENT recommends conservative management.  They do not recommend surgical need.    AKI on CKD stage IIIb: Suspect prerenal in the setting of dehydration.  On IV fluid.  Monitor renal functions closely.  At about her baseline.  Type 2 diabetes with hyperglycemia: A1c 8.1.  Blood sugars elevated. On sliding scale insulin.  Appetite is fair. Resumed long-acting insulin, 20 units that she takes at home.  Essential hypertension Home blood pressure fairly stable on home medications.  GERD: On PPI.  Paroxysmal A. fib on Eliquis: Eliquis on hold.  Rate controlled with Coreg.  Currently sinus rhythm.  Do not anticipate surgical procedure.  Resume Eliquis today.  Polyarthritis/acute gouty attack right thumb: On chronic maintenance allopurinol that is continued.  She used to take colchicine, resumed today.  Adequate pain medications.    DVT prophylaxis: SCDs Start: 03/04/21 0244 apixaban (ELIQUIS)  tablet 5 mg   Code Status: Full code Family Communication: None Disposition Plan: Status is: Inpatient  Remains inpatient appropriate because:Inpatient level of care appropriate due to severity of illness  Dispo:  Patient From: Home  Planned Disposition: Home  Medically stable for discharge: No       Consultants:  ENT Infectious disease  Procedures:  None  Antimicrobials:  Rocephin and Zyvox 9/23--9/24 Zyvox 10/23---- Valtrex 10/23--   Subjective: Patient seen and examined.  Minimal pain at the swelling site.  Surrounding swelling has subsided but the main wound has become more prominent.  No difficulty swallowing or trouble breathing. Today, she has more worst pain on her right thumb.  Objective: Vitals:   03/05/21 1629 03/05/21 2127 03/06/21 0441 03/06/21 0842  BP: 99/65 119/79 128/60 (!) 130/58  Pulse: 69 74 84 80  Resp: 18 19 20 18   Temp: 97.8 F (36.6 C) 98 F (36.7 C) 98 F (36.7 C) 99.1 F (37.3 C)  TempSrc: Oral   Oral  SpO2: 91% 96% 98% 91%  Weight:      Height:        Intake/Output Summary (Last 24 hours) at 03/06/2021 1142 Last data filed at 03/06/2021 1100 Gross per 24 hour  Intake 2080.8 ml  Output 276 ml  Net 1804.8 ml   Filed Weights   03/03/21 1715 03/03/21 1930 03/04/21 1856  Weight: 72.6 kg 80 kg 72.4 kg    Examination:  General: Looks fairly comfortable.  air.  She does have difficulty mobilizing and uses walker at home due to her Charcot joints.  On room air. Cardiovascular: S1-S2 normal.  Regular rate rhythm. Respiratory: Bilateral clear.  No added sounds. Gastrointestinal: Soft and nontender.  Bowel sounds present. Ext: No edema. Diffusely swollen left cheek with carbuncle appearance.  About 5 x 4 cm ,no pus drainage.  No fluctuation.      Data Reviewed: I have personally reviewed following labs and imaging studies  CBC: Recent Labs  Lab 03/03/21 1945 03/04/21 0355 03/05/21 0312 03/06/21 0637  WBC 22.6* 21.9*  14.2* 13.9*  NEUTROABS 19.0*  --   --  11.0*  HGB 12.5 12.3 10.1* 11.1*  HCT 38.5 38.0 32.2* 34.7*  MCV 97.7 99.0 98.5 97.7  PLT 281 259 244 413   Basic Metabolic Panel: Recent Labs  Lab 03/03/21 1945 03/04/21 0355 03/06/21 0637  NA 134* 133* 128*  K 4.3 4.5 4.8  CL 100 98 98  CO2 27 25 23   GLUCOSE 184* 255* 177*  BUN 21 23 23   CREATININE 1.68* 1.70* 1.72*  CALCIUM 8.7* 8.6* 8.6*  MG  --  1.8 1.7  PHOS  --  3.2  --    GFR: Estimated Creatinine Clearance: 25.3 mL/min (A) (by C-G formula based on SCr of 1.72 mg/dL (H)). Liver Function Tests: Recent Labs  Lab 03/03/21 1945  AST 16  ALT 10  ALKPHOS 72  BILITOT 0.8  PROT 6.2*  ALBUMIN 2.9*   No results for input(s): LIPASE, AMYLASE in the last 168 hours. No results for input(s): AMMONIA in the last 168 hours. Coagulation Profile: No results for input(s): INR, PROTIME in the last 168 hours. Cardiac Enzymes: No results for input(s): CKTOTAL, CKMB, CKMBINDEX, TROPONINI in the last 168 hours. BNP (last 3 results) No results for input(s): PROBNP in the last 8760 hours. HbA1C: Recent Labs    03/04/21 0355  HGBA1C 8.1*   CBG: Recent Labs  Lab 03/05/21 1135 03/05/21 1630 03/05/21 2123 03/06/21 0705 03/06/21 1126  GLUCAP 335* 251* 220* 172* 239*   Lipid Profile: No results for input(s): CHOL, HDL, LDLCALC, TRIG, CHOLHDL, LDLDIRECT in the last 72 hours. Thyroid Function Tests: No results for input(s): TSH, T4TOTAL, FREET4, T3FREE, THYROIDAB in the last 72 hours. Anemia Panel: No results for input(s): VITAMINB12, FOLATE, FERRITIN, TIBC, IRON, RETICCTPCT in the last 72 hours. Sepsis Labs: No results for input(s): PROCALCITON, LATICACIDVEN in the last 168 hours.  Recent Results (from the past 240 hour(s))  Resp Panel by RT-PCR (Flu A&B, Covid) Nasopharyngeal Swab     Status: None   Collection Time: 03/04/21 12:11 AM   Specimen: Nasopharyngeal Swab; Nasopharyngeal(NP) swabs in vial transport medium  Result Value  Ref Range Status   SARS Coronavirus 2 by RT PCR NEGATIVE NEGATIVE Final    Comment: (NOTE) SARS-CoV-2 target nucleic acids are NOT DETECTED.  The SARS-CoV-2 RNA is generally detectable in upper respiratory specimens during the acute phase of infection. The lowest concentration of SARS-CoV-2 viral copies this assay can detect is 138 copies/mL. A negative result does not preclude SARS-Cov-2 infection and should not be used as the sole basis for treatment or other patient management decisions. A negative result may occur with  improper specimen collection/handling, submission of specimen other than nasopharyngeal swab, presence of viral mutation(s) within the areas targeted by this assay, and inadequate number of viral copies(<138 copies/mL). A negative result must be combined with clinical observations, patient history, and epidemiological information. The expected result is Negative.  Fact Sheet for Patients:  EntrepreneurPulse.com.au  Fact Sheet for Healthcare Providers:  IncredibleEmployment.be  This test is no t yet approved or cleared by the [place] FDA and  has been authorized for detection and/or diagnosis of  SARS-CoV-2 by FDA under an Emergency Use Authorization (EUA). This EUA will remain  in effect (meaning this test can be used) for the duration of the COVID-19 declaration under Section 564(b)(1) of the Act, 21 U.S.C.section 360bbb-3(b)(1), unless the authorization is terminated  or revoked sooner.       Influenza A by PCR NEGATIVE NEGATIVE Final   Influenza B by PCR NEGATIVE NEGATIVE Final    Comment: (NOTE) The Xpert Xpress SARS-CoV-2/FLU/RSV plus assay is intended as an aid in the diagnosis of influenza from Nasopharyngeal swab specimens and should not be used as a sole basis for treatment. Nasal washings and aspirates are unacceptable for Xpert Xpress SARS-CoV-2/FLU/RSV testing.  Fact Sheet for  Patients: EntrepreneurPulse.com.au  Fact Sheet for Healthcare Providers: IncredibleEmployment.be  This test is not yet approved or cleared by the [place] FDA and has been authorized for detection and/or diagnosis of SARS-CoV-2 by FDA under an Emergency Use Authorization (EUA). This EUA will remain in effect (meaning this test can be used) for the duration of the COVID-19 declaration under Section 564(b)(1) of the Act, 21 U.S.C. section 360bbb-3(b)(1), unless the authorization is terminated or revoked.  Performed at [facility], [place] [address], [place], [place] [address]   Blood culture (routine x 2)     Status: None (Preliminary result)   Collection Time: 03/04/21  1:00 AM   Specimen: BLOOD  Result Value Ref Range Status   Specimen Description BLOOD SITE NOT SPECIFIED  Final   Special Requests AEROBIC BOTTLE ONLY Blood Culture adequate volume  Final   Culture   Final    NO GROWTH 1 DAY Performed at [facility], [place] [address], [place], [place] [address]    Report Status PENDING  Incomplete  Blood culture (routine x 2)     Status: None (Preliminary result)   Collection Time: 03/04/21  1:10 AM   Specimen: BLOOD  Result Value Ref Range Status   Specimen Description BLOOD LEFT ANTECUBITAL  Final   Special Requests   Final    BOTTLES DRAWN AEROBIC AND ANAEROBIC Blood Culture adequate volume   Culture   Final    NO GROWTH 1 DAY Performed at [facility], [place] [address], [place], [place] [address]    Report Status PENDING  Incomplete  MRSA Next Gen by PCR, Nasal     Status: None   Collection Time: 03/04/21  3:17 AM   Specimen: Nasal Mucosa; Nasal Swab  Result Value Ref Range Status   MRSA by PCR Next Gen NOT DETECTED NOT DETECTED Final    Comment: (NOTE) The GeneXpert MRSA Assay (FDA approved for NASAL specimens only), is one component of a comprehensive MRSA colonization surveillance program. It is not intended to  diagnose MRSA infection nor to guide or monitor treatment for MRSA infections. Test performance is not FDA approved in patients less than 47 years old. Performed at [facility], [place] [address], [place], [place] [address]          Radiology Studies: No results found.      Scheduled Meds:  allopurinol  100 mg Oral Daily   amLODipine  10 mg Oral Daily   apixaban  5 mg Oral BID   atorvastatin  10 mg Oral Daily   carvedilol  25 mg Oral BID WC   colchicine  0.3 mg Oral Daily   DULoxetine  60 mg Oral Daily   gabapentin  200 mg Oral QHS   insulin aspart  0-5 Units Subcutaneous QHS   insulin aspart  0-9 Units Subcutaneous TID WC   insulin glargine-yfgn  20 Units Subcutaneous QHS   linezolid  600 mg Oral Q12H   valACYclovir  1,000 mg Oral Daily   Continuous Infusions:     LOS: 2 days    Time spent: 30 minutes    Barb Merino, MD [facility] Pager [phone]

## 2021-03-06 NOTE — Plan of Care (Signed)
  Problem: Clinical Measurements: Goal: Will remain free from infection Outcome: Progressing Goal: Respiratory complications will improve Outcome: Progressing   Problem: Activity: Goal: Risk for activity intolerance will decrease Outcome: Progressing   Problem: Coping: Goal: Level of anxiety will decrease Outcome: Progressing   

## 2021-03-06 NOTE — Progress Notes (Signed)
Subjective: She does not notice much improvement in the cheek.  Objective: Vital signs in last 24 hours: Temp:  [97.8 F (36.6 C)-99.1 F (37.3 C)] 99.1 F (37.3 C) (09/25 0842) Pulse Rate:  [69-84] 80 (09/25 0842) Resp:  [18-20] 18 (09/25 0842) BP: (99-130)/(58-79) 130/58 (09/25 0842) SpO2:  [91 %-98 %] 91 % (09/25 0842) Wt Readings from Last 1 Encounters:  03/04/21 72.4 kg    Intake/Output from previous day: 09/24 0701 - 09/25 0700 In: 1800.8 [P.O.:760; I.V.:1040.8] Out: 275 [Urine:275] Intake/Output this shift: Total I/O In: 1200 [P.O.:1200] Out: 1 [Urine:1]  General appearance: alert, cooperative, and no distress Neck: left cheek area of edema no worse, pus expressible from large ulcerated area, milked out a fair amount of pus  Recent Labs    03/05/21 0312 03/06/21 0637  WBC 14.2* 13.9*  HGB 10.1* 11.1*  HCT 32.2* 34.7*  PLT 244 290    Recent Labs    03/04/21 0355 03/06/21 0637  NA 133* 128*  K 4.5 4.8  CL 98 98  CO2 25 23  GLUCOSE 255* 177*  BUN 23 23  CREATININE 1.70* 1.72*  CALCIUM 8.6* 8.6*    Medications: I have reviewed the patient's current medications.  Assessment/Plan: Left cheek cellulitis, skin ulceration  There does not appear to be worsening of the left cheek infection.  Pus expressible from ulcerated area with a spongy quality.  Continue IV antibiotic coverage.  Will ask nursing to express wound regularly.   LOS: 2 days   Melida Quitter 03/06/2021, 1:41 PM

## 2021-03-07 ENCOUNTER — Telehealth: Payer: Self-pay

## 2021-03-07 ENCOUNTER — Encounter (INDEPENDENT_AMBULATORY_CARE_PROVIDER_SITE_OTHER): Payer: Medicare Other | Admitting: Ophthalmology

## 2021-03-07 DIAGNOSIS — N179 Acute kidney failure, unspecified: Secondary | ICD-10-CM

## 2021-03-07 DIAGNOSIS — L03211 Cellulitis of face: Secondary | ICD-10-CM | POA: Diagnosis not present

## 2021-03-07 LAB — GLUCOSE, CAPILLARY
Glucose-Capillary: 300 mg/dL — ABNORMAL HIGH (ref 70–99)
Glucose-Capillary: 113 mg/dL — ABNORMAL HIGH (ref 70–99)
Glucose-Capillary: 190 mg/dL — ABNORMAL HIGH (ref 70–99)
Glucose-Capillary: 276 mg/dL — ABNORMAL HIGH (ref 70–99)

## 2021-03-07 MED ORDER — GUAIFENESIN 100 MG/5ML PO SOLN
5.0000 mL | ORAL | Status: DC | PRN
  Administered 2021-03-07 – 2021-03-17 (×21): 100 mg via ORAL
  Filled 2021-03-07 (×4): qty 5
  Filled 2021-03-07: qty 25
  Filled 2021-03-07 (×3): qty 5
  Filled 2021-03-07 (×2): qty 25
  Filled 2021-03-07 (×2): qty 5
  Filled 2021-03-07: qty 25
  Filled 2021-03-07 (×4): qty 5
  Filled 2021-03-07: qty 25
  Filled 2021-03-07 (×3): qty 5

## 2021-03-07 MED ORDER — DICLOFENAC SODIUM 1 % EX GEL
2.0000 g | Freq: Four times a day (QID) | CUTANEOUS | Status: DC
  Administered 2021-03-07 – 2021-03-23 (×57): 2 g via TOPICAL
  Filled 2021-03-07 (×3): qty 100

## 2021-03-07 NOTE — Progress Notes (Signed)
Physical Therapy Treatment Patient Details Name: Sabrina Baker MRN: [id number] DOB: July 13, 1940 Today's Date: 03/07/2021   History of Present Illness 80 y.o. female with medical history significant for insulin-dependent type 2 diabetes, diabetic polyneuropathy, Charcot foot, CKD 3B, paroxysmal A. fib on Eliquis, hyperlipidemia, hypertension, tophaceous gout, iron deficiency anemia, anemia of chronic disease, chronic anxiety/depression, who presented to [facility] ED from home due to gradually worsening left facial cellulitis.    PT Comments    Pt progressing towards her physical therapy goals, demonstrating improved activity tolerance and ambulation distance. Ambulating 150 feet with a SPC at a min guard assist level. Will continue to follow acutely to promote mobility.     Recommendations for follow up therapy are one component of a multi-disciplinary discharge planning process, led by the attending physician.  Recommendations may be updated based on patient status, additional functional criteria and insurance authorization.  Follow Up Recommendations  No PT follow up;Supervision for mobility/OOB (pt declining HHPT)     Equipment Recommendations  None recommended by PT    Recommendations for Other Services       Precautions / Restrictions Precautions Precautions: Fall Restrictions Weight Bearing Restrictions: No     Mobility  Bed Mobility Overal bed mobility: Modified Independent                  Transfers Overall transfer level: Needs assistance Equipment used: Straight cane Transfers: Sit to/from Stand Sit to Stand: Min guard;Min assist         General transfer comment: MinA from low height of toilet surface  Ambulation/Gait Ambulation/Gait assistance: Min guard Gait Distance (Feet): 150 Feet Assistive device: Straight cane Gait Pattern/deviations: Step-through pattern;Decreased stride length;Wide base of support;Decreased dorsiflexion - right;Decreased  dorsiflexion - left Gait velocity: decreased   General Gait Details: Utilizing wider BOS with increased bilateral external rotation, min guard for safety   Stairs             Wheelchair Mobility    Modified Rankin (Stroke Patients Only)       Balance Overall balance assessment: Needs assistance Sitting-balance support: Feet supported Sitting balance-Leahy Scale: Good     Standing balance support: Single extremity supported Standing balance-Leahy Scale: Fair                              Cognition Arousal/Alertness: Awake/alert Behavior During Therapy: WFL for tasks assessed/performed Overall Cognitive Status: Within Functional Limits for tasks assessed                                        Exercises      General Comments        Pertinent Vitals/Pain Pain Assessment: Faces Faces Pain Scale: Hurts a little bit Pain Location: R knee, pt reports "feels like gout is coming on." Pain Descriptors / Indicators: Sore Pain Intervention(s): Monitored during session    Home Living                      Prior Function            PT Goals (current goals can now be found in the care plan section) Acute Rehab PT Goals Patient Stated Goal: Figure out this cellulitis and go back home, participate in church volunteer events Potential to Achieve Goals: Good Progress towards PT goals: Progressing toward goals  Frequency    Min 3X/week      PT Plan Current plan remains appropriate    Co-evaluation              AM-PAC PT "6 Clicks" Mobility   Outcome Measure  Help needed turning from your back to your side while in a flat bed without using bedrails?: None Help needed moving from lying on your back to sitting on the side of a flat bed without using bedrails?: None Help needed moving to and from a bed to a chair (including a wheelchair)?: A Little Help needed standing up from a chair using your arms (e.g., wheelchair  or bedside chair)?: A Little Help needed to walk in hospital room?: A Little Help needed climbing 3-5 steps with a railing? : A Lot 6 Click Score: 19    End of Session Equipment Utilized During Treatment: Gait belt Activity Tolerance: Patient tolerated treatment well Patient left: in bed;with call bell/phone within reach;with bed alarm set Nurse Communication: Mobility status PT Visit Diagnosis: Unsteadiness on feet (R26.81);Muscle weakness (generalized) (M62.81)     Time: 1346-1410 PT Time Calculation (min) (ACUTE ONLY): 24 min  Charges:  $Therapeutic Activity: 23-37 mins                     Wyona Almas, PT, DPT Acute Rehabilitation Services Pager [phone] Office [phone]    Deno Etienne 03/07/2021, 4:32 PM

## 2021-03-07 NOTE — TOC Initial Note (Signed)
Transition of Care [facility]) - Initial/Assessment Note    Patient Details  Name: Sabrina Baker MRN: [id number] Date of Birth: [birth year]  Transition of Care [facility]) CM/SW Contact:    Tom-Johnson, Renea Ee, RN Phone Number: 03/07/2021, 3:08 PM  Clinical Narrative:                 CM spoke with patient at bedside about needs for post hospital transition. Patient was sitting up in recliner in room. States she had three children, one son is deceased. She has another son that lives with her and he has health issues himself so they take care of each other. States she is very active at [employer]. Goes to [employer] on Sunday mornings, night, and Wednesday nights. Volunteers at [employer] on Mondays, Thursdays and Fridays. Ambulates with a cane at home and uses her rollator or walker when she is out of the house. She has a supportive family and friends and they transport her to and from her appointments and help her with errands. Her Doristine Bosworth also helps with transportation. Declines home health and other needs at this time.  CM will continue to follow with TOC needs.  Expected Discharge Plan: Home/Self Care Barriers to Discharge: Continued Medical Work up   Patient Goals and CMS Choice Patient states their goals for this hospitalization and ongoing recovery are:: To go home CMS Medicare.gov Compare Post Acute Care list provided to:: Patient Choice offered to / list presented to : NA  Expected Discharge Plan and Services Expected Discharge Plan: Home/Self Care   Discharge Planning Services: CM Consult, NA Post Acute Care Choice: NA Living arrangements for the past 2 months: Single Family Home                 DME Arranged: N/A DME Agency: NA       HH Arranged: NA HH Agency: NA        Prior Living Arrangements/Services Living arrangements for the past 2 months: Single Family Home Lives with:: Adult Children (Son lives with her.) Patient language and need for interpreter reviewed:: Yes Do  you feel safe going back to the place where you live?: Yes      Need for Family Participation in Patient Care: Yes (Comment) Care giver support system in place?: Yes (comment) Current home services: DME (Cane, walker, rollator, shower chair, bedside commode.) Criminal Activity/Legal Involvement Pertinent to Current Situation/Hospitalization: No - Comment as needed  Activities of Daily Living      Permission Sought/Granted Permission sought to share information with : Case Manager, Family Supports Permission granted to share information with : Yes, Verbal Permission Granted              Emotional Assessment Appearance:: Appears stated age Attitude/Demeanor/Rapport: Engaged Affect (typically observed): Accepting, Appropriate, Hopeful Orientation: : Oriented to Self, Oriented to Place, Oriented to  Time, Oriented to Situation Alcohol / Substance Use: Not Applicable    Admission diagnosis:  Facial cellulitis [L03.211] Patient Active Problem List   Diagnosis Date Noted   Facial cellulitis 03/04/2021   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   Staph aureus infection 11/20/2019   Cellulitis 11/19/2019   Atrial fibrillation ([place]) 12/23/2018   ARDS (adult respiratory distress syndrome) ([place])    COVID-19    Mixed dyslipidemia 07/10/2016   Osteopenia 07/10/2016   Vitamin B12 deficiency 07/10/2016   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 [address] Phone: [phone] Fax: [phone]  [facility] [address] [place] [address] Phone: [phone] Fax: [phone]     Social Determinants of Health (SDOH)  Interventions    Readmission Risk Interventions No flowsheet data found.

## 2021-03-07 NOTE — Progress Notes (Signed)
Subjective: Doing fine.  Objective: Vital signs in last 24 hours: Temp:  [97.6 F (36.4 C)-99.1 F (37.3 C)] 98.3 F (36.8 C) (09/26 1723) Pulse Rate:  [70-79] 77 (09/26 1723) Resp:  [16-20] 18 (09/26 1723) BP: (125-147)/(62-71) 147/68 (09/26 1723) SpO2:  [87 %-98 %] 98 % (09/26 1723) Wt Readings from Last 1 Encounters:  03/04/21 72.4 kg    Intake/Output from previous day: 09/25 0701 - 09/26 0700 In: 1320 [P.O.:1320] Out: 652 [Urine:651; Stool:1] Intake/Output this shift: Total I/O In: 840 [P.O.:840] Out: -   General appearance: alert, cooperative, and no distress Head: left cheek wound with improvement in surrounding firmness and erythema, ulcerated area with some expressible pus  Recent Labs    03/05/21 0312 03/06/21 0637  WBC 14.2* 13.9*  HGB 10.1* 11.1*  HCT 32.2* 34.7*  PLT 244 290    Recent Labs    03/06/21 0637  NA 128*  K 4.8  CL 98  CO2 23  GLUCOSE 177*  BUN 23  CREATININE 1.72*  CALCIUM 8.6*    Medications: I have reviewed the patient's current medications.  Assessment/Plan: Left cheek cellulitis/carbuncle, improving  Continue IV antibiotics and expressing of pus from wound.  WBC improving.  No fever.   LOS: 3 days   Melida Quitter 03/07/2021, 6:37 PM

## 2021-03-07 NOTE — Progress Notes (Addendum)
Occupational Therapy Treatment Patient Details Name: MIRYAM MCELHINNEY MRN: [id number] DOB: 1940/09/19 Today's Date: 03/07/2021   History of present illness 80 y.o. female with medical history significant for insulin-dependent type 2 diabetes, diabetic polyneuropathy, Charcot foot, CKD 3B, paroxysmal A. fib on Eliquis, hyperlipidemia, hypertension, tophaceous gout, iron deficiency anemia, anemia of chronic disease, chronic anxiety/depression, who presented to [facility] ED from home due to gradually worsening left facial cellulitis.   OT comments  Patient received in bed and denies pain.  Patient was able to get to eob to donn shoes. Patient ambulated to bathroom with cane and was min guard for balance.  Patient was able to perform toilet hygiene seated and standing and performed grooming standing at sink.  Patient making good progress with OT treatment.    Recommendations for follow up therapy are one component of a multi-disciplinary discharge planning process, led by the attending physician.  Recommendations may be updated based on patient status, additional functional criteria and insurance authorization.    Follow Up Recommendations  No OT follow up    Equipment Recommendations  None recommended by OT    Recommendations for Other Services      Precautions / Restrictions Precautions Precautions: Fall Precaution Comments: watch O2 sats       Mobility Bed Mobility Overal bed mobility: Modified Independent                  Transfers Overall transfer level: Needs assistance Equipment used: Straight cane Transfers: Sit to/from Stand Sit to Stand: Min guard;Min assist Stand pivot transfers: Supervision       General transfer comment: toilet transfer with vc to use rails and safety    Balance Overall balance assessment: Needs assistance Sitting-balance support: Feet supported Sitting balance-Leahy Scale: Good     Standing balance support: Single extremity  supported Standing balance-Leahy Scale: Fair Standing balance comment: cues for safety and balance                           ADL either performed or assessed with clinical judgement   ADL Overall ADL's : Needs assistance/impaired     Grooming: Wash/dry hands;Supervision/safety;Standing Grooming Details (indicate cue type and reason): required cue for safety             Lower Body Dressing: Supervision/safety;Sit to/from stand Lower Body Dressing Details (indicate cue type and reason): donned shoes sitting on eob Toilet Transfer: Regular Toilet;Ambulation Toilet Transfer Details (indicate cue type and reason): used cane and min guard to supervision         Functional mobility during ADLs: Supervision/safety;Cane General ADL Comments: required frequent cues for safety     Vision       Perception     Praxis      Cognition Arousal/Alertness: Awake/alert Behavior During Therapy: WFL for tasks assessed/performed Overall Cognitive Status: Within Functional Limits for tasks assessed                                 General Comments: required cues to pace self and safety        Exercises     Shoulder Instructions       General Comments      Pertinent Vitals/ Pain       Pain Assessment: No/denies pain  Home Living  Prior Functioning/Environment              Frequency  Min 2X/week        Progress Toward Goals  OT Goals(current goals can now be found in the care plan section)  Progress towards OT goals: Progressing toward goals  Acute Rehab OT Goals Patient Stated Goal: Figure out this cellulitis and go back home, participate in church volunteer events OT Goal Formulation: With patient Time For Goal Achievement: 03/18/21 Potential to Achieve Goals: Good ADL Goals Pt Will Perform Lower Body Bathing: with modified independence;sit to/from stand Pt Will Perform  Upper Body Dressing: with modified independence;sitting Pt Will Perform Lower Body Dressing: with modified independence;sit to/from stand Pt Will Transfer to Toilet: with modified independence;ambulating Pt Will Perform Toileting - Clothing Manipulation and hygiene: with modified independence Additional ADL Goal #1: Pt will independently verbalize 3 strategies to reduce risk of falls  Plan Discharge plan remains appropriate    Co-evaluation                 AM-PAC OT "6 Clicks" Daily Activity     Outcome Measure   Help from another person eating meals?: None Help from another person taking care of personal grooming?: None Help from another person toileting, which includes using toliet, bedpan, or urinal?: A Little Help from another person bathing (including washing, rinsing, drying)?: A Little Help from another person to put on and taking off regular upper body clothing?: None Help from another person to put on and taking off regular lower body clothing?: A Little 6 Click Score: 21    End of Session Equipment Utilized During Treatment: Oxygen;Gait belt  OT Visit Diagnosis: Unsteadiness on feet (R26.81);Pain   Activity Tolerance Patient tolerated treatment well   Patient Left in chair;with call bell/phone within reach;with chair alarm set   Nurse Communication Mobility status        Time: 6226-3335 OT Time Calculation (min): 29 min  Charges: OT General Charges $OT Visit: 1 Visit OT Treatments $Self Care/Home Management : 23-37 mins  [name], [place] 03/07/2021, 11:04 AM

## 2021-03-07 NOTE — Telephone Encounter (Signed)
Thanks

## 2021-03-07 NOTE — Care Management Important Message (Signed)
Important Message  Patient Details  Name: Sabrina Baker MRN: [id number] Date of Birth: 16-Aug-1940   Medicare Important Message Given:  Yes     Rushil Kimbrell [address]ine Circle 03/07/2021, 4:40 PM

## 2021-03-07 NOTE — Plan of Care (Signed)

## 2021-03-07 NOTE — Progress Notes (Signed)
[facility] for Infectious Disease   Reason for visit: Follow up on facial cellulitis  Interval History: afebrile, last WBC down to 13.9.  No new complaints.  No associated rash or diarrhea.   Day 5 total antibiotics Day 5 linezolid Day 3 valacyclovir   Physical Exam: Constitutional:  Vitals:   03/07/21 0420 03/07/21 0934  BP: (!) 144/62 125/71  Pulse: 70 79  Resp: 20 16  Temp: 99.1 F (37.3 C) 98.5 F (36.9 C)  SpO2: 96% 93%   patient appears in NAD Eyes: anicteric HENT: let cheek with an open ulcer, carbuncle-like appearance, no pus noted Respiratory: Normal respiratory effort; CTA B Cardiovascular: RRR  Review of Systems: Constitutional: negative for fevers and chills Gastrointestinal: negative for nausea and diarrhea  Lab Results  Component Value Date   WBC 13.9 (H) 03/06/2021   HGB 11.1 (L) 03/06/2021   HCT 34.7 (L) 03/06/2021   MCV 97.7 03/06/2021   PLT 290 03/06/2021    Lab Results  Component Value Date   CREATININE 1.72 (H) 03/06/2021   BUN 23 03/06/2021   NA 128 (L) 03/06/2021   K 4.8 03/06/2021   CL 98 03/06/2021   CO2 23 03/06/2021    Lab Results  Component Value Date   ALT 10 03/03/2021   AST 16 03/03/2021   ALKPHOS 72 03/03/2021     Microbiology: Recent Results (from the past 240 hour(s))  Resp Panel by RT-PCR (Flu A&B, Covid) Nasopharyngeal Swab     Status: None   Collection Time: 03/04/21 12:11 AM   Specimen: Nasopharyngeal Swab; Nasopharyngeal(NP) swabs in vial transport medium  Result Value Ref Range Status   SARS Coronavirus 2 by RT PCR NEGATIVE NEGATIVE Final    Comment: (NOTE) SARS-CoV-2 target nucleic acids are NOT DETECTED.  The SARS-CoV-2 RNA is generally detectable in upper respiratory specimens during the acute phase of infection. The lowest concentration of SARS-CoV-2 viral copies this assay can detect is 138 copies/mL. A negative result does not preclude SARS-Cov-2 infection and should not be used as the sole  basis for treatment or other patient management decisions. A negative result may occur with  improper specimen collection/handling, submission of specimen other than nasopharyngeal swab, presence of viral mutation(s) within the areas targeted by this assay, and inadequate number of viral copies(<138 copies/mL). A negative result must be combined with clinical observations, patient history, and epidemiological information. The expected result is Negative.  Fact Sheet for Patients:  EntrepreneurPulse.com.au  Fact Sheet for Healthcare Providers:  IncredibleEmployment.be  This test is no t yet approved or cleared by the [place] FDA and  has been authorized for detection and/or diagnosis of SARS-CoV-2 by FDA under an Emergency Use Authorization (EUA). This EUA will remain  in effect (meaning this test can be used) for the duration of the COVID-19 declaration under Section 564(b)(1) of the Act, 21 U.S.C.section 360bbb-3(b)(1), unless the authorization is terminated  or revoked sooner.       Influenza A by PCR NEGATIVE NEGATIVE Final   Influenza B by PCR NEGATIVE NEGATIVE Final    Comment: (NOTE) The Xpert Xpress SARS-CoV-2/FLU/RSV plus assay is intended as an aid in the diagnosis of influenza from Nasopharyngeal swab specimens and should not be used as a sole basis for treatment. Nasal washings and aspirates are unacceptable for Xpert Xpress SARS-CoV-2/FLU/RSV testing.  Fact Sheet for Patients: EntrepreneurPulse.com.au  Fact Sheet for Healthcare Providers: IncredibleEmployment.be  This test is not yet approved or cleared by the [place] FDA  and has been authorized for detection and/or diagnosis of SARS-CoV-2 by FDA under an Emergency Use Authorization (EUA). This EUA will remain in effect (meaning this test can be used) for the duration of the COVID-19 declaration under Section 564(b)(1) of the Act,  21 U.S.C. section 360bbb-3(b)(1), unless the authorization is terminated or revoked.  Performed at [facility], [place] [address], [place], [place] [address]   Blood culture (routine x 2)     Status: None (Preliminary result)   Collection Time: 03/04/21  1:00 AM   Specimen: BLOOD  Result Value Ref Range Status   Specimen Description BLOOD SITE NOT SPECIFIED  Final   Special Requests AEROBIC BOTTLE ONLY Blood Culture adequate volume  Final   Culture   Final    NO GROWTH 3 DAYS Performed at [facility] [address] [place], [place] [address]    Report Status PENDING  Incomplete  Blood culture (routine x 2)     Status: None (Preliminary result)   Collection Time: 03/04/21  1:10 AM   Specimen: BLOOD  Result Value Ref Range Status   Specimen Description BLOOD LEFT ANTECUBITAL  Final   Special Requests   Final    BOTTLES DRAWN AEROBIC AND ANAEROBIC Blood Culture adequate volume   Culture   Final    NO GROWTH 3 DAYS Performed at [facility], [place] [address] [place], [place] [address]    Report Status PENDING  Incomplete  MRSA Next Gen by PCR, Nasal     Status: None   Collection Time: 03/04/21  3:17 AM   Specimen: Nasal Mucosa; Nasal Swab  Result Value Ref Range Status   MRSA by PCR Next Gen NOT DETECTED NOT DETECTED Final    Comment: (NOTE) The GeneXpert MRSA Assay (FDA approved for NASAL specimens only), is one component of a comprehensive MRSA colonization surveillance program. It is not intended to diagnose MRSA infection nor to guide or monitor treatment for MRSA infections. Test performance is not FDA approved in patients less than 83 years old. Performed at [facility], [place] [address], [place], [place] [address]   Varicella-zoster by PCR (Blood or Swab)     Status: None   Collection Time: 03/04/21 12:31 PM   Specimen: Nasal Mucosa; Sterile Swab  Result Value Ref Range Status   Varicella-Zoster, PCR Negative Negative Final    Comment:  (NOTE) No Varicella Zoster Virus DNA detected. This test was developed and its performance characteristics determined by [facility].  It has not been cleared or approved by the Food and Drug Administration.  The FDA has determined that such clearance or approval is not necessary. Performed At: [facility] [place], [place] [id number] Rush Farmer MD [id number]     Impression/Plan:  1. Cheek soft tissue infection - per ENT, does not appear to be concerning for parotid involvement.  Swab has been done and VZV negative. HSV pending.  Continue with valacyclovir and linezolid  2.  Medication monitoring - platelets wnl and no leukopenia.  Will continue to monitor.    3.  AKI - creat elevated a little from her recent baseline. Will continue to monitor.

## 2021-03-07 NOTE — Telephone Encounter (Signed)
Patient left voicemail cancelling appt scheduled for 03/10/21 with Dr. Benjamine Mola.  Patient states she was admitted to [facility] last week with cellulitis on the right side of her face.  Patient states she will call back to reschedule.

## 2021-03-07 NOTE — Plan of Care (Signed)
  Problem: Nutrition: Goal: Adequate nutrition will be maintained Outcome: Progressing   Problem: Elimination: Goal: Will not experience complications related to bowel motility Outcome: Progressing   Problem: Elimination: Goal: Will not experience complications related to urinary retention Outcome: Progressing   Problem: Pain Managment: Goal: General experience of comfort will improve Outcome: Progressing   Problem: Safety: Goal: Ability to remain free from injury will improve Outcome: Progressing

## 2021-03-07 NOTE — Progress Notes (Signed)
PROGRESS NOTE    Sabrina Baker  QPY:[id number] DOB: Jan 20, 1941 DOA: 03/03/2021 PCP: [name], MD    Brief Narrative:  80 year old female with history of insulin-dependent type 2 diabetes, diabetic colonopathy, Charcot foot, CKD stage IIIb, paroxysmal A. fib on Eliquis, hypertension hyperlipidemia, chronic anxiety depression, history of facial cellulitis due to MRSA admitted with 3 days of swelling erythema and purulent drainage from left cheek.  In the emergency room temperature 100.5, respiration 23, WBC 22.6 with evidence of cellulitis.  CT maxillofacial with facial cellulitis versus parotiditis.  Admitted with ID and ENT consultation.   Assessment & Plan:   Active Problems:   Facial cellulitis  Sepsis secondary to left facial cellulitis with carbuncle and purulent drainage, present on admission, suspected MRSA infection with previous MRSA facial infection. Sepsis physiology improved. Currently remains on IV linezolid and Valtrex.  Followed by infectious disease.  Swab and cultures pending.  Negative so far. Seen by ENT recommends conservative management.  They do not recommend surgical need.    AKI on CKD stage IIIb: Suspect prerenal in the setting of dehydration.  On IV fluid.  Monitor renal functions closely.  At about her baseline.  Type 2 diabetes with hyperglycemia: A1c 8.1.  Blood sugars elevated. On sliding scale insulin.  Appetite is fair. Home insulin regimen resumed, better today.  Essential hypertension Home blood pressure fairly stable on home medications.  GERD: On PPI.  Paroxysmal A. fib on Eliquis: Rate controlled with Coreg.  Currently sinus rhythm.  Do not anticipate surgical procedure.  Eliquis resumed.  Polyarthritis/acute gouty attack right thumb: On chronic maintenance allopurinol that is continued.  She used to take colchicine, resumed today.  Adequate pain medications.  Improved today.    DVT prophylaxis: SCDs Start: 03/04/21 0244 apixaban  (ELIQUIS) tablet 5 mg   Code Status: Full code Family Communication: None Disposition Plan: Status is: Inpatient  Remains inpatient appropriate because:Inpatient level of care appropriate due to severity of illness  Dispo:  Patient From: Home  Planned Disposition: Home  Medically stable for discharge: No       Consultants:  ENT Infectious disease  Procedures:  None  Antimicrobials:  Rocephin and Zyvox 9/23--9/24 Zyvox 10/23---- Valtrex 10/23--   Subjective: Seen and examined.  Slight discomfort on left cheek.  No other events.  Objective: Vitals:   03/06/21 2053 03/06/21 2100 03/07/21 0420 03/07/21 0934  BP: 131/70  (!) 144/62 125/71  Pulse: 73 71 70 79  Resp: 18  20 16   Temp: 97.6 F (36.4 C)  99.1 F (37.3 C) 98.5 F (36.9 C)  TempSrc: Oral  Oral Oral  SpO2: (!) 87% 95% 96% 93%  Weight:      Height:        Intake/Output Summary (Last 24 hours) at 03/07/2021 1206 Last data filed at 03/07/2021 0900 Gross per 24 hour  Intake 960 ml  Output 651 ml  Net 309 ml   Filed Weights   03/03/21 1715 03/03/21 1930 03/04/21 1856  Weight: 72.6 kg 80 kg 72.4 kg    Examination:  General: Looks fairly comfortable.  air.  She does have difficulty mobilizing and uses walker at home due to her Charcot joints.  On room air. Cardiovascular: S1-S2 normal.  Regular rate rhythm. Respiratory: Bilateral clear.  No added sounds. Gastrointestinal: Soft and nontender.  Bowel sounds present. Ext: No edema. Diffusely swollen left cheek with carbuncle appearance.  About 5 x 4 cm ,no pus drainage.  No fluctuation.  Data Reviewed: I have personally reviewed following labs and imaging studies  CBC: Recent Labs  Lab 03/03/21 1945 03/04/21 0355 03/05/21 0312 03/06/21 0637  WBC 22.6* 21.9* 14.2* 13.9*  NEUTROABS 19.0*  --   --  11.0*  HGB 12.5 12.3 10.1* 11.1*  HCT 38.5 38.0 32.2* 34.7*  MCV 97.7 99.0 98.5 97.7  PLT 281 259 244 127   Basic Metabolic Panel: Recent  Labs  Lab 03/03/21 1945 03/04/21 0355 03/06/21 0637  NA 134* 133* 128*  K 4.3 4.5 4.8  CL 100 98 98  CO2 27 25 23   GLUCOSE 184* 255* 177*  BUN 21 23 23   CREATININE 1.68* 1.70* 1.72*  CALCIUM 8.7* 8.6* 8.6*  MG  --  1.8 1.7  PHOS  --  3.2  --    GFR: Estimated Creatinine Clearance: 25.3 mL/min (A) (by C-G formula based on SCr of 1.72 mg/dL (H)). Liver Function Tests: Recent Labs  Lab 03/03/21 1945  AST 16  ALT 10  ALKPHOS 72  BILITOT 0.8  PROT 6.2*  ALBUMIN 2.9*   No results for input(s): LIPASE, AMYLASE in the last 168 hours. No results for input(s): AMMONIA in the last 168 hours. Coagulation Profile: No results for input(s): INR, PROTIME in the last 168 hours. Cardiac Enzymes: No results for input(s): CKTOTAL, CKMB, CKMBINDEX, TROPONINI in the last 168 hours. BNP (last 3 results) No results for input(s): PROBNP in the last 8760 hours. HbA1C: No results for input(s): HGBA1C in the last 72 hours.  CBG: Recent Labs  Lab 03/06/21 1126 03/06/21 1616 03/06/21 2054 03/07/21 0700 03/07/21 1151  GLUCAP 239* 113* 134* 113* 190*   Lipid Profile: No results for input(s): CHOL, HDL, LDLCALC, TRIG, CHOLHDL, LDLDIRECT in the last 72 hours. Thyroid Function Tests: No results for input(s): TSH, T4TOTAL, FREET4, T3FREE, THYROIDAB in the last 72 hours. Anemia Panel: No results for input(s): VITAMINB12, FOLATE, FERRITIN, TIBC, IRON, RETICCTPCT in the last 72 hours. Sepsis Labs: No results for input(s): PROCALCITON, LATICACIDVEN in the last 168 hours.  Recent Results (from the past 240 hour(s))  Resp Panel by RT-PCR (Flu A&B, Covid) Nasopharyngeal Swab     Status: None   Collection Time: 03/04/21 12:11 AM   Specimen: Nasopharyngeal Swab; Nasopharyngeal(NP) swabs in vial transport medium  Result Value Ref Range Status   SARS Coronavirus 2 by RT PCR NEGATIVE NEGATIVE Final    Comment: (NOTE) SARS-CoV-2 target nucleic acids are NOT DETECTED.  The SARS-CoV-2 RNA is  generally detectable in upper respiratory specimens during the acute phase of infection. The lowest concentration of SARS-CoV-2 viral copies this assay can detect is 138 copies/mL. A negative result does not preclude SARS-Cov-2 infection and should not be used as the sole basis for treatment or other patient management decisions. A negative result may occur with  improper specimen collection/handling, submission of specimen other than nasopharyngeal swab, presence of viral mutation(s) within the areas targeted by this assay, and inadequate number of viral copies(<138 copies/mL). A negative result must be combined with clinical observations, patient history, and epidemiological information. The expected result is Negative.  Fact Sheet for Patients:  EntrepreneurPulse.com.au  Fact Sheet for Healthcare Providers:  IncredibleEmployment.be  This test is no t yet approved or cleared by the [place] FDA and  has been authorized for detection and/or diagnosis of SARS-CoV-2 by FDA under an Emergency Use Authorization (EUA). This EUA will remain  in effect (meaning this test can be used) for the duration of the COVID-19 declaration under Section  564(b)(1) of the Act, 21 U.S.C.section 360bbb-3(b)(1), unless the authorization is terminated  or revoked sooner.       Influenza A by PCR NEGATIVE NEGATIVE Final   Influenza B by PCR NEGATIVE NEGATIVE Final    Comment: (NOTE) The Xpert Xpress SARS-CoV-2/FLU/RSV plus assay is intended as an aid in the diagnosis of influenza from Nasopharyngeal swab specimens and should not be used as a sole basis for treatment. Nasal washings and aspirates are unacceptable for Xpert Xpress SARS-CoV-2/FLU/RSV testing.  Fact Sheet for Patients: EntrepreneurPulse.com.au  Fact Sheet for Healthcare Providers: IncredibleEmployment.be  This test is not yet approved or cleared by the [place] FDA and has been authorized for detection and/or diagnosis of SARS-CoV-2 by FDA under an Emergency Use Authorization (EUA). This EUA will remain in effect (meaning this test can be used) for the duration of the COVID-19 declaration under Section 564(b)(1) of the Act, 21 U.S.C. section 360bbb-3(b)(1), unless the authorization is terminated or revoked.  Performed at [facility], [place] [address], [place], [place] [address]   Blood culture (routine x 2)     Status: None (Preliminary result)   Collection Time: 03/04/21  1:00 AM   Specimen: BLOOD  Result Value Ref Range Status   Specimen Description BLOOD SITE NOT SPECIFIED  Final   Special Requests AEROBIC BOTTLE ONLY Blood Culture adequate volume  Final   Culture   Final    NO GROWTH 2 DAYS Performed at [facility], [place] [address], [place], [place] [address]    Report Status PENDING  Incomplete  Blood culture (routine x 2)     Status: None (Preliminary result)   Collection Time: 03/04/21  1:10 AM   Specimen: BLOOD  Result Value Ref Range Status   Specimen Description BLOOD LEFT ANTECUBITAL  Final   Special Requests   Final    BOTTLES DRAWN AEROBIC AND ANAEROBIC Blood Culture adequate volume   Culture   Final    NO GROWTH 2 DAYS Performed at [facility], [place] [address] [place], [place] [address]    Report Status PENDING  Incomplete  MRSA Next Gen by PCR, Nasal     Status: None   Collection Time: 03/04/21  3:17 AM   Specimen: Nasal Mucosa; Nasal Swab  Result Value Ref Range Status   MRSA by PCR Next Gen NOT DETECTED NOT DETECTED Final    Comment: (NOTE) The GeneXpert MRSA Assay (FDA approved for NASAL specimens only), is one component of a comprehensive MRSA colonization surveillance program. It is not intended to diagnose MRSA infection nor to guide or monitor treatment for MRSA infections. Test performance is not FDA approved in patients less than 61 years old. Performed at [facility],  [place] [address], [place], [place] [address]   Varicella-zoster by PCR (Blood or Swab)     Status: None   Collection Time: 03/04/21 12:31 PM   Specimen: Nasal Mucosa; Sterile Swab  Result Value Ref Range Status   Varicella-Zoster, PCR Negative Negative Final    Comment: (NOTE) No Varicella Zoster Virus DNA detected. This test was developed and its performance characteristics determined by [facility].  It has not been cleared or approved by the Food and Drug Administration.  The FDA has determined that such clearance or approval is not necessary. Performed At: [facility] [place], [place] [id number] Rush Farmer MD [id number]          Radiology Studies: No results found.  Scheduled Meds:  allopurinol  100 mg Oral Daily   amLODipine  10 mg Oral Daily   apixaban  5 mg Oral BID   atorvastatin  10 mg Oral Daily   carvedilol  25 mg Oral BID WC   colchicine  0.3 mg Oral Daily   DULoxetine  60 mg Oral Daily   gabapentin  200 mg Oral QHS   insulin aspart  0-5 Units Subcutaneous QHS   insulin aspart  0-9 Units Subcutaneous TID WC   insulin glargine-yfgn  20 Units Subcutaneous QHS   linezolid  600 mg Oral Q12H   valACYclovir  1,000 mg Oral Daily   Continuous Infusions:     LOS: 3 days    Time spent: 30 minutes    Barb Merino, MD [facility] Pager [phone]

## 2021-03-08 ENCOUNTER — Other Ambulatory Visit (HOSPITAL_COMMUNITY): Payer: Self-pay

## 2021-03-08 DIAGNOSIS — N179 Acute kidney failure, unspecified: Secondary | ICD-10-CM | POA: Diagnosis not present

## 2021-03-08 DIAGNOSIS — M109 Gout, unspecified: Secondary | ICD-10-CM

## 2021-03-08 DIAGNOSIS — M1000 Idiopathic gout, unspecified site: Secondary | ICD-10-CM | POA: Diagnosis not present

## 2021-03-08 DIAGNOSIS — L03211 Cellulitis of face: Secondary | ICD-10-CM | POA: Diagnosis not present

## 2021-03-08 LAB — BASIC METABOLIC PANEL
Anion gap: 6 (ref 5–15)
BUN: 17 mg/dL (ref 8–23)
CO2: 27 mmol/L (ref 22–32)
Calcium: 8.6 mg/dL — ABNORMAL LOW (ref 8.9–10.3)
Chloride: 101 mmol/L (ref 98–111)
Creatinine, Ser: 1.28 mg/dL — ABNORMAL HIGH (ref 0.44–1.00)
GFR, Estimated: 43 mL/min — ABNORMAL LOW (ref >60)
Glucose, Bld: 168 mg/dL — ABNORMAL HIGH (ref 70–99)
Potassium: 5 mmol/L (ref 3.5–5.1)
Sodium: 134 mmol/L — ABNORMAL LOW (ref 135–145)

## 2021-03-08 LAB — GLUCOSE, CAPILLARY
Glucose-Capillary: 91 mg/dL (ref 70–99)
Glucose-Capillary: 154 mg/dL — ABNORMAL HIGH (ref 70–99)
Glucose-Capillary: 172 mg/dL — ABNORMAL HIGH (ref 70–99)
Glucose-Capillary: 210 mg/dL — ABNORMAL HIGH (ref 70–99)

## 2021-03-08 MED ORDER — INSULIN GLARGINE-YFGN 100 UNIT/ML ~~LOC~~ SOLN
20.0000 [IU] | Freq: Every day | SUBCUTANEOUS | Status: DC
  Administered 2021-03-09: 20 [IU] via SUBCUTANEOUS
  Filled 2021-03-08: qty 0.2

## 2021-03-08 MED ORDER — LINEZOLID 600 MG PO TABS: 600.0000 mg | ORAL_TABLET | Freq: Two times a day (BID) | ORAL | 0 refills | Status: DC

## 2021-03-08 NOTE — Progress Notes (Signed)
PROGRESS NOTE  Sabrina Baker    DOB: 06-05-1941, 80 y.o.  HAL:[id number]  PCP: [name], MD   Code Status: Full Code   DOA: 03/03/2021   LOS: 4  Brief Narrative of Current Hospitalization  Sabrina Baker is a 80 y.o. female with a PMH significant for insulin-dependent type 2 diabetes, Charcot foot, CKD stage IIIb, A. 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 They presented from home to the ED on 03/03/2021 with swelling, erythema, purulence from left cheek x 3 days. In the ED, it was found that they had cellulitis as demonstrated on maxillofacial CT. They were treated with IV antibiotics.  ENT, rheumatology and ID were also consulted. Patient was admitted to medicine service for further workup and management of cellulitis as outlined in detail below.  03/08/21 -stable  Assessment & Plan  Active Problems:   Facial cellulitis  Facial cellulitis/carbuncle- afebrile, improving.  Blood cultures negative x3 days.  Nasal MRSA screening negative. Unfortunately, dressing is adhered to wound on left cheek so poor observation today. Consulting WOC for proper dressing and can again go to evaluate. There was no tenderness or erythema to surrounding tissues visible this morning. HSV preliminary results negative -ID following      -Continue linezolid, potentially deescalate today but will defer to ID      - likely discontinue valcyclovir today - ENT following - WOC consult - pain control PRN - monitor Bx Cx and fevers  Hyponatremia-last sodium was on 9/25 and was 128.  Patient is asymptomatic. -Recheck BMP today  AKI on CKD stage IIIb -BMP today pending  Insulin dependent Type 2 diabetes- fasting glucose this am 91. Hgb A1c was 8.1 on admission. Takes trulicity at home as well. - continue sSSI -hold night time 20 units Semglee today and will restart it as morning dose tomorrow for better safety profile -Continue  atorvastatin  HTN -Continue amlodipine, carvedilol  A. 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 Yes; Fluid consistency: Thin  Diet effective now       Comments: Patient is Vegetarian.  Question Answer Comment  Diet-HS Snack? Nothing   Room service appropriate? Yes   Fluid consistency: Thin      03/05/21 2340            Subjective 03/08/21    Pt reports continued discomfort in right thumb and knee. She feels much improved since admission. Denies pain in her cheek.  Disposition Plan & Communication  Status is: Inpatient  Remains inpatient appropriate because:IV treatments appropriate due to intensity of illness or inability to take PO  Dispo:  Patient From: Home  Planned Disposition: Home pending deescalation of IV antibiotics by ID and wound care  Medically stable for discharge: No       Family Communication: none   Consults, Procedures, Significant Events  Consultants:  ID Rheumatology ENT  Procedures/significant events:  none  Antimicrobials:  Anti-infectives (From admission, onward)    Start     Dose/Rate Route Frequency Ordered Stop   03/04/21 2200  cefTRIAXone (ROCEPHIN) 2 g in sodium chloride 0.9 % 100 mL IVPB  Status:  Discontinued  2 g 200 mL/hr over 30 Minutes Intravenous Every 24 hours 03/04/21 0327 03/04/21 1444   03/04/21 1600  valACYclovir (VALTREX) tablet 1,000 mg  Status:  Discontinued        1,000 mg Oral 3 times daily 03/04/21 1444 03/04/21 1446   03/04/21 1500  valACYclovir (VALTREX) tablet 1,000 mg        1,000 mg Oral Daily 03/04/21 1446      03/04/21 0300  linezolid (ZYVOX) tablet 600 mg        600 mg Oral Every 12 hours 03/04/21 0245     03/04/21 0230  cefTRIAXone (ROCEPHIN) 2 g in sodium chloride 0.9 % 100 mL IVPB        2 g 200 mL/hr over 30 Minutes Intravenous  Once 03/04/21 0218 03/04/21 0309        Objective   Vitals:   03/07/21 0934 03/07/21 1723 03/07/21 2018 03/08/21 0654  BP: 125/71 (!) 147/68 130/63 (!) 154/68  Pulse: 79 77 71 94  Resp: 16 18 17 16   Temp: 98.5 F (36.9 C) 98.3 F (36.8 C) 97.9 F (36.6 C)   TempSrc: Oral Oral Oral   SpO2: 93% 98% 93% 93%  Weight:      Height:        Intake/Output Summary (Last 24 hours) at 03/08/2021 0810 Last data filed at 03/08/2021 0400 Gross per 24 hour  Intake 700 ml  Output 800 ml  Net -100 ml   Filed Weights   03/03/21 1715 03/03/21 1930 03/04/21 1856  Weight: 72.6 kg 80 kg 72.4 kg    Patient BMI: Body mass index is 28.27 kg/m.   Physical Exam: General: awake, alert, NAD HEENT: atraumatic, clear conjunctiva, anicteric sclera, moist mucus membranes, hearing grossly normal. Lesion on left cheek has dry dressing adhered. No surrounding erythema or tenderness Respiratory: CTAB,  normal respiratory effort. Cardiovascular: quick capillary refill  Gastrointestinal: soft, NT, ND, no HSM felt Nervous: A&O x2. no gross focal neurologic deficits Extremities: moves all equally, no edema, normal tone. Tenderness to right knee with mild to no swelling. R thumb normal temp, tender to ROM, no erythema, generalized swelling to thumb Skin: dry, intact, normal temperature, normal color, No rashes, lesions or ulcers Psychiatry: pressured speech. Mildly anxious  Labs   I have personally reviewed following labs and imaging studies Admission on 03/03/2021  Component Date Value Ref Range Status   WBC 03/03/2021 22.6 (A) 4.0 - 10.5 K/uL Final   RBC 03/03/2021 3.94  3.87 - 5.11 MIL/uL Final   Hemoglobin 03/03/2021 12.5  12.0 - 15.0 g/dL Final   HCT 03/03/2021 38.5  36.0 -  46.0 % Final   MCV 03/03/2021 97.7  80.0 - 100.0 fL Final   MCH 03/03/2021 31.7  26.0 - 34.0 pg Final   MCHC 03/03/2021 32.5  30.0 - 36.0 g/dL Final   RDW 03/03/2021 14.6  11.5 - 15.5 % Final   Platelets 03/03/2021 281  150 - 400 K/uL Final   nRBC 03/03/2021 0.0  0.0 - 0.2 % Final   Neutrophils Relative % 03/03/2021 84  % Final   Neutro Abs 03/03/2021 19.0 (A) 1.7 - 7.7 K/uL Final   Lymphocytes Relative 03/03/2021 7  % Final   Lymphs Abs 03/03/2021 1.6  0.7 - 4.0 K/uL Final   Monocytes Relative 03/03/2021 8  % Final   Monocytes Absolute 03/03/2021 1.7 (A) 0.1 - 1.0 K/uL Final   Eosinophils Relative 03/03/2021 1  % Final   Eosinophils Absolute 03/03/2021 0.1  0.0 - 0.5 K/uL Final   Basophils Relative 03/03/2021 0  % Final   Basophils Absolute 03/03/2021 0.1  0.0 - 0.1 K/uL Final   Immature Granulocytes 03/03/2021 0  % Final   Abs Immature Granulocytes 03/03/2021 0.10 (A) 0.00 - 0.07 K/uL Final   Sodium 03/03/2021 134 (A) 135 - 145 mmol/L Final   Potassium 03/03/2021 4.3  3.5 - 5.1 mmol/L Final   Chloride 03/03/2021 100  98 - 111 mmol/L Final   CO2 03/03/2021 27  22 - 32 mmol/L Final   Glucose, Bld 03/03/2021 184 (A) 70 - 99 mg/dL Final   BUN 03/03/2021 21  8 - 23 mg/dL Final   Creatinine, Ser 03/03/2021 1.68 (A) 0.44 - 1.00 mg/dL Final   Calcium 03/03/2021 8.7 (A) 8.9 - 10.3 mg/dL Final   Total Protein 03/03/2021 6.2 (A) 6.5 - 8.1 g/dL Final   Albumin 03/03/2021 2.9 (A) 3.5 - 5.0 g/dL Final   AST 03/03/2021 16  15 - 41 U/L Final   ALT 03/03/2021 10  0 - 44 U/L Final   Alkaline Phosphatase 03/03/2021 72  38 - 126 U/L Final   Total Bilirubin 03/03/2021 0.8  0.3 - 1.2 mg/dL Final   GFR, Estimated 03/03/2021 31 (A) >60 mL/min Final   Anion gap 03/03/2021 7  5 - 15 Final   Specimen Description 03/04/2021 BLOOD SITE NOT SPECIFIED   Final   Special Requests 03/04/2021 AEROBIC BOTTLE ONLY Blood Culture adequate volume   Final   Culture 03/04/2021    Final                   Value:NO  GROWTH 3 DAYS Performed at [facility], [place] [address] [place], [place] [address]    Report Status 03/04/2021 PENDING   Incomplete   Specimen Description 03/04/2021 BLOOD LEFT ANTECUBITAL   Final   Special Requests 03/04/2021 BOTTLES DRAWN AEROBIC AND ANAEROBIC Blood Culture adequate volume   Final   Culture 03/04/2021    Final                   Value:NO GROWTH 3 DAYS Performed at [facility], [place] [address] [place], [place] [address]    Report Status 03/04/2021 PENDING   Incomplete   SARS Coronavirus 2 by RT PCR 03/04/2021 NEGATIVE  NEGATIVE Final   Influenza A by PCR 03/04/2021 NEGATIVE  NEGATIVE Final   Influenza B by PCR 03/04/2021 NEGATIVE  NEGATIVE Final   MRSA by PCR Next Gen 03/04/2021 NOT DETECTED  NOT DETECTED Final   Hgb A1c MFr Bld 03/04/2021 8.1 (A) 4.8 - 5.6 % Final   Mean Plasma Glucose 03/04/2021 185.77  mg/dL Final   WBC 03/04/2021 21.9 (A) 4.0 - 10.5 K/uL Final   RBC 03/04/2021 3.84 (A) 3.87 - 5.11 MIL/uL Final   Hemoglobin 03/04/2021 12.3  12.0 - 15.0 g/dL Final   HCT 03/04/2021 38.0  36.0 - 46.0 % Final   MCV 03/04/2021 99.0  80.0 - 100.0 fL Final   MCH 03/04/2021 32.0  26.0 - 34.0 pg Final   MCHC 03/04/2021 32.4  30.0 - 36.0 g/dL Final   RDW 03/04/2021 14.6  11.5 - 15.5 % Final   Platelets 03/04/2021 259  150 - 400 K/uL Final   nRBC 03/04/2021 0.0  0.0 - 0.2 % Final   Sodium 03/04/2021 133 (A) 135 - 145 mmol/L Final   Potassium 03/04/2021 4.5  3.5 - 5.1 mmol/L Final   Chloride 03/04/2021 98  98 - 111 mmol/L Final   CO2 03/04/2021 25  22 - 32 mmol/L Final   Glucose, Bld 03/04/2021 255 (A) 70 - 99 mg/dL Final   BUN 03/04/2021 23  8 - 23 mg/dL Final   Creatinine, Ser 03/04/2021 1.70 (A) 0.44 - 1.00 mg/dL Final   Calcium 03/04/2021 8.6 (A) 8.9 - 10.3 mg/dL Final   GFR, Estimated 03/04/2021 30 (A) >60 mL/min Final   Anion gap 03/04/2021 10  5 - 15 Final   Magnesium 03/04/2021 1.8  1.7 - 2.4 mg/dL Final   Phosphorus 03/04/2021 3.2  2.5 - 4.6 mg/dL  Final   Glucose-Capillary 03/04/2021 199 (A) 70 - 99 mg/dL Final   Source of Sample 03/04/2021 PENDING   Incomplete   HSV 1 DNA 03/04/2021 Negative  Negative Final   HSV 2 DNA 03/04/2021 Negative  Negative Final   Varicella-Zoster, PCR 03/04/2021 Negative  Negative Final   Glucose-Capillary 03/04/2021 278 (A) 70 - 99 mg/dL Final   WBC 03/05/2021 14.2 (A) 4.0 - 10.5 K/uL Final   RBC 03/05/2021 3.27 (A) 3.87 - 5.11 MIL/uL Final   Hemoglobin 03/05/2021 10.1 (A) 12.0 - 15.0 g/dL Final   HCT 03/05/2021 32.2 (A) 36.0 - 46.0 % Final   MCV 03/05/2021 98.5  80.0 - 100.0 fL Final   MCH 03/05/2021 30.9  26.0 - 34.0 pg Final   MCHC 03/05/2021 31.4  30.0 - 36.0 g/dL Final   RDW 03/05/2021 14.3  11.5 - 15.5 % Final   Platelets 03/05/2021 244  150 - 400 K/uL Final   nRBC 03/05/2021 0.0  0.0 - 0.2 % Final   Glucose-Capillary 03/05/2021 115 (A) 70 - 99 mg/dL Final   Glucose-Capillary 03/05/2021 335 (A) 70 - 99 mg/dL Final   Glucose-Capillary 03/05/2021 251 (A) 70 - 99 mg/dL Final   WBC 03/06/2021 13.9 (A) 4.0 - 10.5 K/uL Final   RBC 03/06/2021 3.55 (A) 3.87 - 5.11 MIL/uL Final   Hemoglobin 03/06/2021 11.1 (A) 12.0 - 15.0 g/dL Final   HCT 03/06/2021 34.7 (A) 36.0 - 46.0 % Final   MCV 03/06/2021 97.7  80.0 - 100.0 fL Final   MCH 03/06/2021 31.3  26.0 - 34.0 pg Final   MCHC 03/06/2021 32.0  30.0 - 36.0 g/dL Final   RDW 03/06/2021 14.5  11.5 - 15.5 % Final   Platelets 03/06/2021 290  150 - 400 K/uL Final   nRBC 03/06/2021 0.0  0.0 - 0.2 % Final   Neutrophils Relative % 03/06/2021 79  % Final   Neutro Abs 03/06/2021 11.0 (A) 1.7 - 7.7 K/uL Final   Lymphocytes Relative 03/06/2021 8  % Final   Lymphs Abs 03/06/2021 1.1  0.7 - 4.0 K/uL Final   Monocytes Relative 03/06/2021 9  % Final   Monocytes Absolute 03/06/2021 1.2 (A) 0.1 - 1.0 K/uL Final   Eosinophils Relative 03/06/2021 3  % Final   Eosinophils Absolute 03/06/2021 0.4  0.0 - 0.5 K/uL Final   Basophils Relative 03/06/2021 0  % Final   Basophils  Absolute 03/06/2021 0.0  0.0 - 0.1 K/uL Final   Immature Granulocytes 03/06/2021 1  % Final   Abs Immature Granulocytes 03/06/2021 0.15 (A) 0.00 - 0.07 K/uL Final   Sodium 03/06/2021 128 (A) 135 - 145 mmol/L Final   Potassium 03/06/2021 4.8  3.5 - 5.1 mmol/L Final   Chloride 03/06/2021 98  98 - 111 mmol/L Final   CO2 03/06/2021 23  22 - 32 mmol/L Final   Glucose, Bld 03/06/2021 177 (A)  70 - 99 mg/dL Final   BUN 03/06/2021 23  8 - 23 mg/dL Final   Creatinine, Ser 03/06/2021 1.72 (A) 0.44 - 1.00 mg/dL Final   Calcium 03/06/2021 8.6 (A) 8.9 - 10.3 mg/dL Final   GFR, Estimated 03/06/2021 30 (A) >60 mL/min Final   Anion gap 03/06/2021 7  5 - 15 Final   Magnesium 03/06/2021 1.7  1.7 - 2.4 mg/dL Final   Glucose-Capillary 03/05/2021 220 (A) 70 - 99 mg/dL Final   Glucose-Capillary 03/06/2021 172 (A) 70 - 99 mg/dL Final   Glucose-Capillary 03/06/2021 239 (A) 70 - 99 mg/dL Final   Glucose-Capillary 03/06/2021 113 (A) 70 - 99 mg/dL Final   Glucose-Capillary 03/06/2021 134 (A) 70 - 99 mg/dL Final   Glucose-Capillary 03/07/2021 113 (A) 70 - 99 mg/dL Final   Glucose-Capillary 03/04/2021 300 (A) 70 - 99 mg/dL Final   Comment 1 03/04/2021 Notify RN   Final   Glucose-Capillary 03/07/2021 190 (A) 70 - 99 mg/dL Final   Glucose-Capillary 03/07/2021 276 (A) 70 - 99 mg/dL Final     Recent Results (from the past 240 hour(s))  Resp Panel by RT-PCR (Flu A&B, Covid) Nasopharyngeal Swab     Status: None   Collection Time: 03/04/21 12:11 AM   Specimen: Nasopharyngeal Swab; Nasopharyngeal(NP) swabs in vial transport medium  Result Value Ref Range Status   SARS Coronavirus 2 by RT PCR NEGATIVE NEGATIVE Final    Comment: (NOTE) SARS-CoV-2 target nucleic acids are NOT DETECTED.  The SARS-CoV-2 RNA is generally detectable in upper respiratory specimens during the acute phase of infection. The lowest concentration of SARS-CoV-2 viral copies this assay can detect is 138 copies/mL. A negative result does not  preclude SARS-Cov-2 infection and should not be used as the sole basis for treatment or other patient management decisions. A negative result may occur with  improper specimen collection/handling, submission of specimen other than nasopharyngeal swab, presence of viral mutation(s) within the areas targeted by this assay, and inadequate number of viral copies(<138 copies/mL). A negative result must be combined with clinical observations, patient history, and epidemiological information. The expected result is Negative.  Fact Sheet for Patients:  EntrepreneurPulse.com.au  Fact Sheet for Healthcare Providers:  IncredibleEmployment.be  This test is no t yet approved or cleared by the [place] FDA and  has been authorized for detection and/or diagnosis of SARS-CoV-2 by FDA under an Emergency Use Authorization (EUA). This EUA will remain  in effect (meaning this test can be used) for the duration of the COVID-19 declaration under Section 564(b)(1) of the Act, 21 U.S.C.section 360bbb-3(b)(1), unless the authorization is terminated  or revoked sooner.       Influenza A by PCR NEGATIVE NEGATIVE Final   Influenza B by PCR NEGATIVE NEGATIVE Final    Comment: (NOTE) The Xpert Xpress SARS-CoV-2/FLU/RSV plus assay is intended as an aid in the diagnosis of influenza from Nasopharyngeal swab specimens and should not be used as a sole basis for treatment. Nasal washings and aspirates are unacceptable for Xpert Xpress SARS-CoV-2/FLU/RSV testing.  Fact Sheet for Patients: EntrepreneurPulse.com.au  Fact Sheet for Healthcare Providers: IncredibleEmployment.be  This test is not yet approved or cleared by the [place] FDA and has been authorized for detection and/or diagnosis of SARS-CoV-2 by FDA under an Emergency Use Authorization (EUA). This EUA will remain in effect (meaning this test can be used) for the  duration of the COVID-19 declaration under Section 564(b)(1) of the Act, 21 U.S.C. section 360bbb-3(b)(1), unless the authorization is terminated  or revoked.  Performed at [facility], [place] [address] [place], [place] [address]   Blood culture (routine x 2)     Status: None (Preliminary result)   Collection Time: 03/04/21  1:00 AM   Specimen: BLOOD  Result Value Ref Range Status   Specimen Description BLOOD SITE NOT SPECIFIED  Final   Special Requests AEROBIC BOTTLE ONLY Blood Culture adequate volume  Final   Culture   Final    NO GROWTH 3 DAYS Performed at [place] [address] [place], [place] [address]    Report Status PENDING  Incomplete  Blood culture (routine x 2)     Status: None (Preliminary result)   Collection Time: 03/04/21  1:10 AM   Specimen: BLOOD  Result Value Ref Range Status   Specimen Description BLOOD LEFT ANTECUBITAL  Final   Special Requests   Final    BOTTLES DRAWN AEROBIC AND ANAEROBIC Blood Culture adequate volume   Culture   Final    NO GROWTH 3 DAYS Performed at [facility], [place] [address] [place], [place] [address]    Report Status PENDING  Incomplete  MRSA Next Gen by PCR, Nasal     Status: None   Collection Time: 03/04/21  3:17 AM   Specimen: Nasal Mucosa; Nasal Swab  Result Value Ref Range Status   MRSA by PCR Next Gen NOT DETECTED NOT DETECTED Final    Comment: (NOTE) The GeneXpert MRSA Assay (FDA approved for NASAL specimens only), is one component of a comprehensive MRSA colonization surveillance program. It is not intended to diagnose MRSA infection nor to guide or monitor treatment for MRSA infections. Test performance is not FDA approved in patients less than 1 years old. Performed at [facility], [place] [address] [place], [place] [address]   Varicella-zoster by PCR (Blood or Swab)     Status: None   Collection Time: 03/04/21 12:31 PM   Specimen: Nasal Mucosa; Sterile Swab  Result Value Ref Range  Status   Varicella-Zoster, PCR Negative Negative Final    Comment: (NOTE) No Varicella Zoster Virus DNA detected. This test was developed and its performance characteristics determined by [facility].  It has not been cleared or approved by the Food and Drug Administration.  The FDA has determined that such clearance or approval is not necessary. Performed At: [facility] [place], [place] [id number] Rush Farmer MD [id number]      Imaging Studies  No results found. Medications   Scheduled Meds:  allopurinol  100 mg Oral Daily   amLODipine  10 mg Oral Daily   apixaban  5 mg Oral BID   atorvastatin  10 mg Oral Daily   carvedilol  25 mg Oral BID WC   colchicine  0.3 mg Oral Daily   diclofenac Sodium  2 g Topical QID   DULoxetine  60 mg Oral Daily   gabapentin  200 mg Oral QHS   insulin aspart  0-5 Units Subcutaneous QHS   insulin aspart  0-9 Units Subcutaneous TID WC   insulin glargine-yfgn  20 Units Subcutaneous QHS   linezolid  600 mg Oral Q12H   valACYclovir  1,000 mg Oral Daily   Continuous Infusions:   LOS: 4 days   Time spent: >35 min   Richarda Osmond, DO [facility] 03/08/2021, 8:10 AM   To contact the [facility] Attending or Consulting provider for this patient: Check the care team in [employer] for a)  attending/consulting [facility] provider listed and b) the [facility] team listed Log into [email or web address] and use [place]'s universal password to access. If you do not have the password, please contact the hospital operator. Locate the [facility] provider you are looking for under [facility] and page to a number that you can be directly reached. If you still have difficulty reaching the provider, please page the [facility] (Director on Call) for the Hospitalists listed on amion for assistance.

## 2021-03-08 NOTE — Progress Notes (Signed)
[facility] for Infectious Disease   Reason for visit: follow up on facial cellulitis  Interval History:  Day 6 total antibiotics Day 6 linezolid Day 4 valacyclovir   Physical Exam: Constitutional:  Vitals:   03/08/21 0654 03/08/21 1100  BP: (!) 154/68 (!) 113/55  Pulse: 94 71  Resp: 16 18  Temp:  98 F (36.7 C)  SpO2: 93% 94%  She is in nad Eyes:anicteric HENT: left cheek with an open ulcer, drying, no pus Respiratory: normal respiratory effort Cardiovascular: RRR  Review of Systems: Constitutional: negative for chills or fever Gastrointestinal: negative for diarrhea  Lab Results  Component Value Date   WBC 13.9 (H) 03/06/2021   HGB 11.1 (L) 03/06/2021   HCT 34.7 (L) 03/06/2021   MCV 97.7 03/06/2021   PLT 290 03/06/2021    Lab Results  Component Value Date   CREATININE 1.28 (H) 03/08/2021   BUN 17 03/08/2021   NA 134 (L) 03/08/2021   K 5.0 03/08/2021   CL 101 03/08/2021   CO2 27 03/08/2021    Lab Results  Component Value Date   ALT 10 03/03/2021   AST 16 03/03/2021   ALKPHOS 72 03/03/2021     Microbiology: Recent Results (from the past 240 hour(s))  Resp Panel by RT-PCR (Flu A&B, Covid) Nasopharyngeal Swab     Status: None   Collection Time: 03/04/21 12:11 AM   Specimen: Nasopharyngeal Swab; Nasopharyngeal(NP) swabs in vial transport medium  Result Value Ref Range Status   SARS Coronavirus 2 by RT PCR NEGATIVE NEGATIVE Final    Comment: (NOTE) SARS-CoV-2 target nucleic acids are NOT DETECTED.  The SARS-CoV-2 RNA is generally detectable in upper respiratory specimens during the acute phase of infection. The lowest concentration of SARS-CoV-2 viral copies this assay can detect is 138 copies/mL. A negative result does not preclude SARS-Cov-2 infection and should not be used as the sole basis for treatment or other patient management decisions. A negative result may occur with  improper specimen collection/handling, submission of specimen  other than nasopharyngeal swab, presence of viral mutation(s) within the areas targeted by this assay, and inadequate number of viral copies(<138 copies/mL). A negative result must be combined with clinical observations, patient history, and epidemiological information. The expected result is Negative.  Fact Sheet for Patients:  EntrepreneurPulse.com.au  Fact Sheet for Healthcare Providers:  IncredibleEmployment.be  This test is no t yet approved or cleared by the [place] FDA and  has been authorized for detection and/or diagnosis of SARS-CoV-2 by FDA under an Emergency Use Authorization (EUA). This EUA will remain  in effect (meaning this test can be used) for the duration of the COVID-19 declaration under Section 564(b)(1) of the Act, 21 U.S.C.section 360bbb-3(b)(1), unless the authorization is terminated  or revoked sooner.       Influenza A by PCR NEGATIVE NEGATIVE Final   Influenza B by PCR NEGATIVE NEGATIVE Final    Comment: (NOTE) The Xpert Xpress SARS-CoV-2/FLU/RSV plus assay is intended as an aid in the diagnosis of influenza from Nasopharyngeal swab specimens and should not be used as a sole basis for treatment. Nasal washings and aspirates are unacceptable for Xpert Xpress SARS-CoV-2/FLU/RSV testing.  Fact Sheet for Patients: EntrepreneurPulse.com.au  Fact Sheet for Healthcare Providers: IncredibleEmployment.be  This test is not yet approved or cleared by the [place] FDA and has been authorized for detection and/or diagnosis of SARS-CoV-2 by FDA under an Emergency Use Authorization (EUA). This EUA will remain in effect (meaning this test  can be used) for the duration of the COVID-19 declaration under Section 564(b)(1) of the Act, 21 U.S.C. section 360bbb-3(b)(1), unless the authorization is terminated or revoked.  Performed at [facility], [place] [address] [place],  [place] [address]   Blood culture (routine x 2)     Status: None (Preliminary result)   Collection Time: 03/04/21  1:00 AM   Specimen: BLOOD  Result Value Ref Range Status   Specimen Description BLOOD SITE NOT SPECIFIED  Final   Special Requests AEROBIC BOTTLE ONLY Blood Culture adequate volume  Final   Culture   Final    NO GROWTH 4 DAYS Performed at [facility] [address], [place], [place] [address]    Report Status PENDING  Incomplete  Blood culture (routine x 2)     Status: None (Preliminary result)   Collection Time: 03/04/21  1:10 AM   Specimen: BLOOD  Result Value Ref Range Status   Specimen Description BLOOD LEFT ANTECUBITAL  Final   Special Requests   Final    BOTTLES DRAWN AEROBIC AND ANAEROBIC Blood Culture adequate volume   Culture   Final    NO GROWTH 4 DAYS Performed at [facility], [place] [address], [place], [place] [address]    Report Status PENDING  Incomplete  MRSA Next Gen by PCR, Nasal     Status: None   Collection Time: 03/04/21  3:17 AM   Specimen: Nasal Mucosa; Nasal Swab  Result Value Ref Range Status   MRSA by PCR Next Gen NOT DETECTED NOT DETECTED Final    Comment: (NOTE) The GeneXpert MRSA Assay (FDA approved for NASAL specimens only), is one component of a comprehensive MRSA colonization surveillance program. It is not intended to diagnose MRSA infection nor to guide or monitor treatment for MRSA infections. Test performance is not FDA approved in patients less than 76 years old. Performed at [facility], [place] [address] [place], [place] [address]   Varicella-zoster by PCR (Blood or Swab)     Status: None   Collection Time: 03/04/21 12:31 PM   Specimen: Nasal Mucosa; Sterile Swab  Result Value Ref Range Status   Varicella-Zoster, PCR Negative Negative Final    Comment: (NOTE) No Varicella Zoster Virus DNA detected. This test was developed and its performance characteristics determined by [facility].  It has not been cleared or  approved by the Food and Drug Administration.  The FDA has determined that such clearance or approval is not necessary. Performed At: [facility] [place], [place] [id number] Rush Farmer MD PN:[id number]     Impression/Plan:  1. Cheek soft tissue infection - improving on today's exam with more drying.  HSV negative, no signs of viral infection.  Will stop valacyclovir.  Will continue with linezolid.  Unknown organism and improving on linezolid so will continue.   Can use linezolid at discharge, no insurance coverage issues.  Will plan about 5 more days.   2.  Medication monitoring - will continue to monitor platelets.  No issues to date.   3.  AKI - creat trending down, 1.28 today   Will continue to monitor.

## 2021-03-08 NOTE — Consult Note (Signed)
[facility] Nurse Consult Note: Patient receiving care in [facility] [phone] Reason for Consult: wound on left cheek not properly dressed. dressing is adhered to lesion. Wound type: "soft tissue cellulitis" per note from Dr. Melida Quitter.  Pressure Injury POA: Yes/No/NA Measurement: 4 x 2 Wound bed: pink, with some fibrinous tissue, friable.  Drainage (amount, consistency, odor) Bloody drainage on gauze Periwound: intact   Dressing procedure/placement/frequency: Dry gauze dressing was adhered to the base of the wound which required saturating with NS to loosen enough to remove. Flushed with NS then applied a half piece of Xeroform gauze over the wound and secured with a foam dressing. Change the Xeroform gauze daily.  Monitor the wound area(s) for worsening of condition such as: Signs/symptoms of infection, increase in size, development of or worsening of odor, development of pain, or increased pain at the affected locations.   Notify the medical team if any of these develop.  Thank you for the consult. [facility] nurse will not follow at this time.   Please re-consult the [facility] team if needed.  [name] [name], MSN, RN, [place], [name], [facility] Wound Treatment Associate Pager [phone]

## 2021-03-08 NOTE — Plan of Care (Signed)
  Problem: Education: Goal: Knowledge of General Education information will improve Description: Including pain rating scale, medication(s)/side effects and non-pharmacologic comfort measures Outcome: Progressing   Problem: Health Behavior/Discharge Planning: Goal: Ability to manage health-related needs will improve Outcome: Progressing   Problem: Clinical Measurements: Goal: Ability to maintain clinical measurements within normal limits will improve Outcome: Progressing   Problem: Elimination: Goal: Will not experience complications related to bowel motility Outcome: Progressing Goal: Will not experience complications related to urinary retention Outcome: Progressing   

## 2021-03-09 ENCOUNTER — Encounter (HOSPITAL_COMMUNITY): Payer: Self-pay | Admitting: Internal Medicine

## 2021-03-09 DIAGNOSIS — M1000 Idiopathic gout, unspecified site: Secondary | ICD-10-CM | POA: Diagnosis not present

## 2021-03-09 DIAGNOSIS — M109 Gout, unspecified: Secondary | ICD-10-CM | POA: Diagnosis not present

## 2021-03-09 DIAGNOSIS — L03211 Cellulitis of face: Secondary | ICD-10-CM | POA: Diagnosis not present

## 2021-03-09 LAB — CULTURE, BLOOD (ROUTINE X 2)
Culture: NO GROWTH
Culture: NO GROWTH
Special Requests: ADEQUATE
Special Requests: ADEQUATE

## 2021-03-09 LAB — GLUCOSE, CAPILLARY
Glucose-Capillary: 81 mg/dL (ref 70–99)
Glucose-Capillary: 225 mg/dL — ABNORMAL HIGH (ref 70–99)
Glucose-Capillary: 196 mg/dL — ABNORMAL HIGH (ref 70–99)
Glucose-Capillary: 193 mg/dL — ABNORMAL HIGH (ref 70–99)

## 2021-03-09 MED ORDER — INSULIN GLARGINE-YFGN 100 UNIT/ML ~~LOC~~ SOLN
10.0000 [IU] | Freq: Every day | SUBCUTANEOUS | Status: DC
  Administered 2021-03-10 – 2021-03-23 (×14): 10 [IU] via SUBCUTANEOUS
  Filled 2021-03-09 (×14): qty 0.1

## 2021-03-09 MED ORDER — COLCHICINE 0.6 MG PO TABS
0.6000 mg | ORAL_TABLET | Freq: Every day | ORAL | Status: DC
  Administered 2021-03-10 – 2021-03-11 (×2): 0.6 mg via ORAL
  Filled 2021-03-09 (×2): qty 1

## 2021-03-09 NOTE — Progress Notes (Signed)
PT Cancellation Note  Patient Details Name: Sabrina Baker MRN: [id number] DOB: 09/17/1940   Cancelled Treatment:    Reason Eval/Treat Not Completed: Other (comment) Pt politely declining therapy session due to pain; RN aware.  Wyona Almas, PT, DPT Acute Rehabilitation Services Pager [phone] Office [phone]    Deno Etienne 03/09/2021, 2:19 PM

## 2021-03-09 NOTE — Progress Notes (Signed)
Subjective: Not complaining about cheek.  Objective: Vital signs in last 24 hours: Temp:  [98.5 F (36.9 C)-99 F (37.2 C)] 99 F (37.2 C) (09/28 1100) Pulse Rate:  [68-73] 72 (09/28 1100) Resp:  [18-20] 20 (09/28 1100) BP: (131-142)/(56-66) 140/56 (09/28 1100) SpO2:  [91 %-97 %] 91 % (09/28 1100) Weight:  [75.9 kg] 75.9 kg (09/27 2044) Wt Readings from Last 1 Encounters:  03/08/21 75.9 kg    Intake/Output from previous day: 09/27 0701 - 09/28 0700 In: 360 [P.O.:360] Out: 1100 [Urine:1100] Intake/Output this shift: Total I/O In: 240 [P.O.:240] Out: -   General appearance: alert, cooperative, and no distress Head: left cheek wound with bloody drainage, less purulence, surrounding swelling improved  No results for input(s): WBC, HGB, HCT, PLT in the last 72 hours.  Recent Labs    03/08/21 1112  NA 134*  K 5.0  CL 101  CO2 27  GLUCOSE 168*  BUN 17  CREATININE 1.28*  CALCIUM 8.6*    Medications: I have reviewed the patient's current medications.  Assessment/Plan: Left cheek cellulitis/carbuncle  The cheek seems to be steadily improving.  OK to discharge on oral antibiotics when she is felt medically stable.  She can follow-up with me in a week as an outpatient.   LOS: 5 days   Melida Quitter 03/09/2021, 11:56 AM

## 2021-03-09 NOTE — Discharge Summary (Addendum)
Physician Discharge Summary  EMMYLOU BIEKER IRC:[id number] DOB: 18-May-1941 DOA: 03/03/2021  PCP: [name], MD  Admit date: 03/03/2021 Discharge date: 03/13/2021  Admitted From: home Disposition:  home  Recommendations for Outpatient Follow-up:  Follow up with PCP within 1 week to recheck BMP and monitor kidney function/electrolytes. Also to monitor healing of cellulitis of Left cheek. (Linezolid to be completed on 10/5) Follow up with rheumatology for chronic gout- colchicine reduced to 0.3mg  daily  Home Health:home health PT was recommended by PT inpatient and I encouraged her to participate but patient very strongly declined.  Equipment/Devices:oxygen  Discharge Condition:stable, improved CODE STATUS:full Diet recommendation:  Brief/Interim Summary: Pt initially admitted for carbuncle/cellulitis of left cheek which was improved with linezolid as managed by ID and ENT. She remained afebrile and pain free throughout admission.  Her largest concern was pain in knees and R thumb from chronic gout. Rheumatology saw her while she was inpatient and she was continued on colchicine, allopurinol and pain medication. She participated with PT who ultimately recommended home health PT but patient declined.  Additionally, patient had Aki while admitted which lead to delay of discharge but resolved with IV fluids and decreasing colchicine dose.  She was stable at baseline on day of discharge.   Discharge Diagnoses:  Active Problems:   Facial cellulitis   Acute idiopathic gout  Allergies as of 03/13/2021       Reactions   Ramipril Anaphylaxis, Other (See Comments)   Other Other (See Comments)   Cortisone Itching, Rash   Niacin Diarrhea   And nausea   Niacin And Related Diarrhea   Valsartan Other (See Comments)   Hyperkalemia        Medication List     STOP taking these medications    bacitracin-polymyxin b ophthalmic ointment Commonly known as: POLYSPORIN   bevacizumab 400  MG/16ML Soln Commonly known as: AVASTIN   CALCIUM-MAGNESIUM-ZINC PO   cholecalciferol 1000 units tablet Commonly known as: VITAMIN D   Florastor 250 MG capsule Generic drug: saccharomyces boulardii   furosemide 20 MG tablet Commonly known as: LASIX   Magnesium 500 MG Caps   pantoprazole 40 MG tablet Commonly known as: PROTONIX       TAKE these medications    acetaminophen 650 MG CR tablet Commonly known as: TYLENOL Take 1,300 mg by mouth every 8 (eight) hours as needed for pain.   allopurinol 100 MG tablet Commonly known as: ZYLOPRIM Take 100 mg by mouth daily.   amLODipine 10 MG tablet Commonly known as: NORVASC Take 10 mg by mouth daily.   apixaban 5 MG Tabs tablet Commonly known as: ELIQUIS Take 1 tablet (5 mg total) by mouth 2 (two) times daily.   atorvastatin 10 MG tablet Commonly known as: LIPITOR Take 10 mg by mouth daily.   calcitRIOL 0.25 MCG capsule Commonly known as: ROCALTROL Take 0.25 mcg by mouth 3 (three) times a week. Mon, Wed, Friday   calcium-vitamin D 500-200 MG-UNIT Tabs tablet Commonly known as: OSCAL WITH D Take 1 tablet by mouth daily.   carvedilol 25 MG tablet Commonly known as: COREG Take 25 mg by mouth 2 (two) times daily with a meal.   colchicine 0.6 MG tablet Take 0.6 mg by mouth daily. Started 04/07/13   [employer] B-12 500 MCG tablet Generic drug: vitamin B-12 Take 1,000 mcg by mouth daily.   DULoxetine 60 MG capsule Commonly known as: CYMBALTA Take 60 mg by mouth daily.   gabapentin 100 MG capsule Commonly known as:  NEURONTIN Take 200 mg by mouth at bedtime.   GlucoCom Blood Glucose Monitor Devi Use to test blood sugar 3 times a day (E11.65)   HYDROcodone-acetaminophen 5-325 MG tablet Commonly known as: NORCO/VICODIN Take 1 tablet by mouth every 6 (six) hours as needed for moderate pain.   Insulin Pen Needle 32G X 4 MM Misc USE TO INJECT INSULIN ONCE A DAY   Iron 325 (65 Fe) MG Tabs Take 1 tablet by mouth 2  (two) times daily.   ketoconazole 2 % shampoo Commonly known as: NIZORAL Apply 1 application topically 2 (two) times a week.   Lancets 30G Misc Use to test blood sugars 3 times a day (Dx: E11.65)   latanoprost 0.005 % ophthalmic solution Commonly known as: XALATAN Place 1 drop into the left eye at bedtime.   linezolid 600 MG tablet Commonly known as: ZYVOX Take 1 tablet (600 mg total) by mouth 2 (two) times daily for 3 days.   mupirocin cream 2 % Commonly known as: BACTROBAN Apply topically 2 (two) times daily. Apply to forehead wound until healed   nateglinide 120 MG tablet Commonly known as: STARLIX Take 120 mg by mouth 3 (three) times daily as needed. If CBG is above 130   OneTouch Verio test strip Generic drug: glucose blood USE AS DIRECTED. TESTING FREQUENCY  3 X/DAILY (DX E11.65)   oxyCODONE 5 MG immediate release tablet Commonly known as: Oxy IR/ROXICODONE Take 1 tablet (5 mg total) by mouth every 6 (six) hours as needed for up to 3 days for moderate pain or breakthrough pain.   Toujeo Max SoloStar 300 UNIT/ML Solostar Pen Generic drug: insulin glargine (2 Unit Dial) Inject 20 Units into the skin daily.   triamcinolone cream 0.1 % Commonly known as: KENALOG Apply 1 application topically 2 (two) times daily as needed (For rash).   trimethoprim-polymyxin b ophthalmic solution Commonly known as: POLYTRIM Use 4 times in left eye the day of injection, 4 drop the following day.   Trulicity 1.5 RJ/1.8AC Sopn Generic drug: Dulaglutide Inject 3 mg into the skin every 7 (seven) days. Friday        Follow-up Information     Melida Quitter, MD. Schedule an appointment as soon as possible for a visit in 1 week(s).   Specialty: Otolaryngology Contact information: [address] [place] [address] [phone]                Allergies  Allergen Reactions   Ramipril Anaphylaxis and Other (See Comments)   Other Other (See Comments)    Cortisone Itching and Rash   Niacin Diarrhea    And nausea   Niacin And Related Diarrhea   Valsartan Other (See Comments)    Hyperkalemia    Consultations: ID Rheumatology ENT  Procedures/Studies: MR ABDOMEN WO CONTRAST  Result Date: 02/21/2021 CLINICAL DATA:  Follow-up renal lesion EXAM: MRI ABDOMEN WITHOUT CONTRAST TECHNIQUE: Multiplanar multisequence MR imaging was performed without the administration of intravenous contrast. COMPARISON:  Renal ultrasound dated 08/04/2020 FINDINGS: Severely motion degraded images. Lower chest: Lung bases are clear. Hepatobiliary: Liver is within normal limits. Gallbladder is unremarkable. No intrahepatic or extrahepatic ductal dilatation. Pancreas:  Within normal limits. Spleen:  Within normal limits. Adrenals/Urinary Tract:  Adrenal glands are within normal limits. 12 x 17 mm cyst in the anterior left upper kidney (series 7/image 19). Markedly motion degraded precontrast T1 imaging, limiting assessment for hemorrhage. Post-contrast imaging was not performed. While this favors a relatively simple cyst, this is  poorly evaluated on the current study. Mild scarring in the posteromedial left upper kidney (series 7/image 20), at the site of prior hemorrhagic cyst on CT. Tiny bilateral renal cysts. No hydronephrosis. Stomach/Bowel: Stomach is notable for a moderate hiatal hernia. Visualized bowel is grossly unremarkable. Vascular/Lymphatic:  No evidence of abdominal aortic aneurysm. No suspicious abdominal lymphadenopathy. Other:  No abdominal ascites. Musculoskeletal: Mild degenerative changes of the visualized thoracolumbar spine. IMPRESSION: Limited evaluation due to motion degradation and lack of intravenous contrast administration. 17 mm predominantly simple cyst in the anterior left upper kidney, although poorly evaluated. This is not considered suspicious for neoplasm and it is unclear that additional follow-up imaging is warranted given the patient's age.  However, if follow-up is desired, please note that MR is unlikely to be beneficial given the motion degradation. Additional ancillary findings as above. Electronically Signed   By: Julian Hy M.D.   On: 02/21/2021 20:41   CT MAXILLOFACIAL WO CONTRAST  Result Date: 03/04/2021 CLINICAL DATA:  Facial wound and abscess. EXAM: CT MAXILLOFACIAL WITHOUT CONTRAST TECHNIQUE: Multidetector CT imaging of the maxillofacial structures was performed. Multiplanar CT image reconstructions were also generated. COMPARISON:  Head CT dated 11/17/2019. FINDINGS: Osseous: No fracture or mandibular dislocation. No destructive process. Orbits: The globes and retro-orbital fat are preserved. Right scleral banding. Sinuses: Clear. Soft tissues: There is diffuse skin thickening and soft tissue swelling of the left side of the face in the region of the left parotid gland with thickening of the left earlobe. No drainable fluid collection or abscess. Several top-normal left cervical lymph nodes, likely reactive. Limited intracranial: Mild degenerative changes.  No acute findings. IMPRESSION: Left facial cellulitis versus parotiditis. Clinical correlation is recommended. No drainable fluid collection or abscess. Electronically Signed   By: Anner Crete M.D.   On: 03/04/2021 01:31    Subjective: Patient feels very ready to go home today. Feels at baseline.  Discharge Exam: Vitals:   03/13/21 0445 03/13/21 1001  BP: (!) 143/56 140/64  Pulse: 71 77  Resp: 18 16  Temp: 98.2 F (36.8 C) 98 F (36.7 C)  SpO2: 95% 93%    General: Pt is alert, awake, not in acute distress Cardiovascular: RRR, S1/S2 +, no rubs, no gallops Respiratory: CTA bilaterally, no wheezing, no rhonchi Abdominal: Soft, NT, ND, bowel sounds + Extremities: no edema, no cyanosis. Positive for deformity of R thumb without erythema or increased warmth. Bilateral knees without effusion, erythema, or pain to palpation.  Labs: Basic Metabolic  Panel: Recent Labs  Lab 03/10/21 0422 03/11/21 0211 03/11/21 1420 03/12/21 0846 03/13/21 0245  NA 132* 131* 130* 131* 133*  K 5.0 5.2* 5.1 5.0 5.1  CL 98 97* 96* 99 103  CO2 28 27 25 25 23   GLUCOSE 124* 96 216* 151* 87  BUN 13 20 24* 24* 19  CREATININE 1.22* 1.51* 1.54* 1.52* 1.19*  CALCIUM 8.8* 8.6* 8.3* 8.2* 8.1*   CBC: Recent Labs  Lab 03/12/21 0846 03/13/21 0245  WBC 10.8* 9.7  HGB 10.0* 9.4*  HCT 31.7* 30.1*  MCV 98.4 99.3  PLT 304 295    Microbiology Recent Results (from the past 240 hour(s))  Resp Panel by RT-PCR (Flu A&B, Covid) Nasopharyngeal Swab     Status: None   Collection Time: 03/04/21 12:11 AM   Specimen: Nasopharyngeal Swab; Nasopharyngeal(NP) swabs in vial transport medium  Result Value Ref Range Status   SARS Coronavirus 2 by RT PCR NEGATIVE NEGATIVE Final    Comment: (NOTE) SARS-CoV-2 target nucleic acids  are NOT DETECTED.  The SARS-CoV-2 RNA is generally detectable in upper respiratory specimens during the acute phase of infection. The lowest concentration of SARS-CoV-2 viral copies this assay can detect is 138 copies/mL. A negative result does not preclude SARS-Cov-2 infection and should not be used as the sole basis for treatment or other patient management decisions. A negative result may occur with  improper specimen collection/handling, submission of specimen other than nasopharyngeal swab, presence of viral mutation(s) within the areas targeted by this assay, and inadequate number of viral copies(<138 copies/mL). A negative result must be combined with clinical observations, patient history, and epidemiological information. The expected result is Negative.  Fact Sheet for Patients:  EntrepreneurPulse.com.au  Fact Sheet for Healthcare Providers:  IncredibleEmployment.be  This test is no t yet approved or cleared by the [place] FDA and  has been authorized for detection and/or diagnosis of  SARS-CoV-2 by FDA under an Emergency Use Authorization (EUA). This EUA will remain  in effect (meaning this test can be used) for the duration of the COVID-19 declaration under Section 564(b)(1) of the Act, 21 U.S.C.section 360bbb-3(b)(1), unless the authorization is terminated  or revoked sooner.       Influenza A by PCR NEGATIVE NEGATIVE Final   Influenza B by PCR NEGATIVE NEGATIVE Final    Comment: (NOTE) The Xpert Xpress SARS-CoV-2/FLU/RSV plus assay is intended as an aid in the diagnosis of influenza from Nasopharyngeal swab specimens and should not be used as a sole basis for treatment. Nasal washings and aspirates are unacceptable for Xpert Xpress SARS-CoV-2/FLU/RSV testing.  Fact Sheet for Patients: EntrepreneurPulse.com.au  Fact Sheet for Healthcare Providers: IncredibleEmployment.be  This test is not yet approved or cleared by the [place] FDA and has been authorized for detection and/or diagnosis of SARS-CoV-2 by FDA under an Emergency Use Authorization (EUA). This EUA will remain in effect (meaning this test can be used) for the duration of the COVID-19 declaration under Section 564(b)(1) of the Act, 21 U.S.C. section 360bbb-3(b)(1), unless the authorization is terminated or revoked.  Performed at [facility], [place] [address] [place], [place] [address]   Blood culture (routine x 2)     Status: None   Collection Time: 03/04/21  1:00 AM   Specimen: BLOOD  Result Value Ref Range Status   Specimen Description BLOOD SITE NOT SPECIFIED  Final   Special Requests AEROBIC BOTTLE ONLY Blood Culture adequate volume  Final   Culture   Final    NO GROWTH 5 DAYS Performed at [facility] [address] [place], [place] [address]    Report Status 03/09/2021 FINAL  Final  Blood culture (routine x 2)     Status: None   Collection Time: 03/04/21  1:10 AM   Specimen: BLOOD  Result Value Ref Range Status   Specimen  Description BLOOD LEFT ANTECUBITAL  Final   Special Requests   Final    BOTTLES DRAWN AEROBIC AND ANAEROBIC Blood Culture adequate volume   Culture   Final    NO GROWTH 5 DAYS Performed at [facility], [place] [address], [place], [place] [address]    Report Status 03/09/2021 FINAL  Final  MRSA Next Gen by PCR, Nasal     Status: None   Collection Time: 03/04/21  3:17 AM   Specimen: Nasal Mucosa; Nasal Swab  Result Value Ref Range Status   MRSA by PCR Next Gen NOT DETECTED NOT DETECTED Final    Comment: (NOTE) The GeneXpert  MRSA Assay (FDA approved for NASAL specimens only), is one component of a comprehensive MRSA colonization surveillance program. It is not intended to diagnose MRSA infection nor to guide or monitor treatment for MRSA infections. Test performance is not FDA approved in patients less than 33 years old. Performed at [facility], [place] [address] [place], [place] [address]   Varicella-zoster by PCR (Blood or Swab)     Status: None   Collection Time: 03/04/21 12:31 PM   Specimen: Nasal Mucosa; Sterile Swab  Result Value Ref Range Status   Varicella-Zoster, PCR Negative Negative Final    Comment: (NOTE) No Varicella Zoster Virus DNA detected. This test was developed and its performance characteristics determined by [facility].  It has not been cleared or approved by the Food and Drug Administration.  The FDA has determined that such clearance or approval is not necessary. Performed At: [facility] [place], [place] [id number] Rush Farmer MD [id number]     Time coordinating discharge: Over 17 minutes  Richarda Osmond, MD  [facility] 03/13/2021, 12:46 PM Pager   If 7PM-7AM, please contact night-coverage [email or web address] Password [email or web address]

## 2021-03-09 NOTE — Progress Notes (Signed)
[facility] for Infectious Disease   Reason for visit: Follow up on soft tissue infection  Interval History: no new complaints, remains afebrile.  No associated rash or diarrhea.  Day 7 linezolid  Physical Exam: Constitutional:  Vitals:   03/08/21 2044 03/09/21 1100  BP: (!) 142/66 (!) 140/56  Pulse: 68 72  Resp: 18 20  Temp: 98.8 F (37.1 C) 99 F (37.2 C)  SpO2: 94% 91%   patient appears in NAD Eyes: anicteric HENT: left cheek with no pus, healthy tissue.  No surrounding erythema Respiratory: Normal respiratory effort  Review of Systems: Constitutional: negative for fevers and chills Gastrointestinal: negative for nausea and diarrhea Integument/breast: negative for rash  Lab Results  Component Value Date   WBC 13.9 (H) 03/06/2021   HGB 11.1 (L) 03/06/2021   HCT 34.7 (L) 03/06/2021   MCV 97.7 03/06/2021   PLT 290 03/06/2021    Lab Results  Component Value Date   CREATININE 1.28 (H) 03/08/2021   BUN 17 03/08/2021   NA 134 (L) 03/08/2021   K 5.0 03/08/2021   CL 101 03/08/2021   CO2 27 03/08/2021    Lab Results  Component Value Date   ALT 10 03/03/2021   AST 16 03/03/2021   ALKPHOS 72 03/03/2021     Microbiology: Recent Results (from the past 240 hour(s))  Resp Panel by RT-PCR (Flu A&B, Covid) Nasopharyngeal Swab     Status: None   Collection Time: 03/04/21 12:11 AM   Specimen: Nasopharyngeal Swab; Nasopharyngeal(NP) swabs in vial transport medium  Result Value Ref Range Status   SARS Coronavirus 2 by RT PCR NEGATIVE NEGATIVE Final    Comment: (NOTE) SARS-CoV-2 target nucleic acids are NOT DETECTED.  The SARS-CoV-2 RNA is generally detectable in upper respiratory specimens during the acute phase of infection. The lowest concentration of SARS-CoV-2 viral copies this assay can detect is 138 copies/mL. A negative result does not preclude SARS-Cov-2 infection and should not be used as the sole basis for treatment or other patient management  decisions. A negative result may occur with  improper specimen collection/handling, submission of specimen other than nasopharyngeal swab, presence of viral mutation(s) within the areas targeted by this assay, and inadequate number of viral copies(<138 copies/mL). A negative result must be combined with clinical observations, patient history, and epidemiological information. The expected result is Negative.  Fact Sheet for Patients:  EntrepreneurPulse.com.au  Fact Sheet for Healthcare Providers:  IncredibleEmployment.be  This test is no t yet approved or cleared by the [place] FDA and  has been authorized for detection and/or diagnosis of SARS-CoV-2 by FDA under an Emergency Use Authorization (EUA). This EUA will remain  in effect (meaning this test can be used) for the duration of the COVID-19 declaration under Section 564(b)(1) of the Act, 21 U.S.C.section 360bbb-3(b)(1), unless the authorization is terminated  or revoked sooner.       Influenza A by PCR NEGATIVE NEGATIVE Final   Influenza B by PCR NEGATIVE NEGATIVE Final    Comment: (NOTE) The Xpert Xpress SARS-CoV-2/FLU/RSV plus assay is intended as an aid in the diagnosis of influenza from Nasopharyngeal swab specimens and should not be used as a sole basis for treatment. Nasal washings and aspirates are unacceptable for Xpert Xpress SARS-CoV-2/FLU/RSV testing.  Fact Sheet for Patients: EntrepreneurPulse.com.au  Fact Sheet for Healthcare Providers: IncredibleEmployment.be  This test is not yet approved or cleared by the [place] FDA and has been authorized for detection and/or diagnosis of SARS-CoV-2 by FDA  under an Emergency Use Authorization (EUA). This EUA will remain in effect (meaning this test can be used) for the duration of the COVID-19 declaration under Section 564(b)(1) of the Act, 21 U.S.C. section 360bbb-3(b)(1), unless the  authorization is terminated or revoked.  Performed at [facility], [place] [address], [place], [place] [address]   Blood culture (routine x 2)     Status: None   Collection Time: 03/04/21  1:00 AM   Specimen: BLOOD  Result Value Ref Range Status   Specimen Description BLOOD SITE NOT SPECIFIED  Final   Special Requests AEROBIC BOTTLE ONLY Blood Culture adequate volume  Final   Culture   Final    NO GROWTH 5 DAYS Performed at [facility] [address], [place], [place] [address]    Report Status 03/09/2021 FINAL  Final  Blood culture (routine x 2)     Status: None   Collection Time: 03/04/21  1:10 AM   Specimen: BLOOD  Result Value Ref Range Status   Specimen Description BLOOD LEFT ANTECUBITAL  Final   Special Requests   Final    BOTTLES DRAWN AEROBIC AND ANAEROBIC Blood Culture adequate volume   Culture   Final    NO GROWTH 5 DAYS Performed at [facility], [place] [address], [place], [place] [address]    Report Status 03/09/2021 FINAL  Final  MRSA Next Gen by PCR, Nasal     Status: None   Collection Time: 03/04/21  3:17 AM   Specimen: Nasal Mucosa; Nasal Swab  Result Value Ref Range Status   MRSA by PCR Next Gen NOT DETECTED NOT DETECTED Final    Comment: (NOTE) The GeneXpert MRSA Assay (FDA approved for NASAL specimens only), is one component of a comprehensive MRSA colonization surveillance program. It is not intended to diagnose MRSA infection nor to guide or monitor treatment for MRSA infections. Test performance is not FDA approved in patients less than 51 years old. Performed at [facility], [place] [address], [place],  [address]   Varicella-zoster by PCR (Blood or Swab)     Status: None   Collection Time: 03/04/21 12:31 PM   Specimen: Nasal Mucosa; Sterile Swab  Result Value Ref Range Status   Varicella-Zoster, PCR Negative Negative Final    Comment: (NOTE) No Varicella Zoster Virus DNA detected. This test was developed and its  performance characteristics determined by [facility].  It has not been cleared or approved by the Food and Drug Administration.  The FDA has determined that such clearance or approval is not necessary. Performed At: [facility] [place], [place] [id number] Rush Farmer MD [id number]     Impression/Plan:  1. Cheek soft tissue infection - improving on linezolid so will continue with this.  I recommend 5 more days and then can stop.  I will arrange follow up in our clinic after discharge  2.  Medication monitoring - I will check a CBC at follow up with me  3.  Possible gout attack - some improvement by her report.  She will follow up with her rheumatologist after discharge.

## 2021-03-09 NOTE — Progress Notes (Signed)
OT Cancellation Note  Patient Details Name: Sabrina Baker MRN: [id number] DOB: Oct 28, 1940   Cancelled Treatment:    Reason Eval/Treat Not Completed: Medical issues which prohibited therapy.  Patient was in bed moaning and complaining of BLE pain.  Patient states that she has too much gout pain.  [name], OTA  Demetrio Leighty Alexis Goodell 03/09/2021, 2:22 PM

## 2021-03-09 NOTE — Progress Notes (Signed)
PROGRESS NOTE  Sabrina Baker    DOB: 05-20-1941, 80 y.o.  QQI:[id number]  PCP: [name], MD   Code Status: Full Code   DOA: 03/03/2021   LOS: 5  Brief Narrative of Current Hospitalization  Sabrina Baker is a 80 y.o. female with a PMH significant for insulin-dependent type 2 diabetes, Charcot foot, CKD stage IIIb, A. 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 They presented from home to the ED on 03/03/2021 with swelling, erythema, purulence from left cheek x 3 days. In the ED, it was found that they had cellulitis as demonstrated on maxillofacial CT. They were treated with IV antibiotics.  ENT, rheumatology and ID were also consulted. Patient was admitted to medicine service for further workup and management of cellulitis as outlined in detail below.  03/09/21 -stable  Assessment & Plan  Active Problems:   Facial cellulitis   Acute idiopathic gout  Facial cellulitis/carbuncle- afebrile, improving.  Blood cultures negative. Patient has no complaints on her face. -ID following      -Continue linezolid until 10/2 - ENT following- signed off for dc - WOC s/o - pain control PRN  Hyponatremia- improved to 134  AKI on CKD stage IIIb- Cr improved to 1.28 and is at baseline  Insulin dependent Type 2 diabetes- fasting glucose this am 81. Hgb A1c was 8.1 on admission. Takes trulicity at home as well. - continue sSSI - decrease home Semglee to 10units daily am starting tomorrow -Continue atorvastatin  HTN- chronic, stable -Continue amlodipine, carvedilol  A. 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 Yes; Fluid consistency: Thin  Diet effective now       Comments: Patient is Vegetarian.  Question Answer Comment  Diet-HS Snack? Nothing   Room service appropriate? Yes   Fluid consistency: Thin      03/05/21 2340            Subjective 03/09/21    Pt reports continued discomfort in right thumb and knees that continue to improve. She has no complaints about her cheek  Disposition Plan & Communication  Status is: Inpatient  Remains inpatient appropriate because:IV treatments appropriate due to intensity of illness or inability to take PO  Dispo:  Patient From: Home  Planned Disposition: Home 9/29 pending pain control  Medically stable for discharge: No     Family Communication: none   Consults, Procedures, Significant Events  Consultants:  ID Rheumatology ENT  Procedures/significant events:  none  Antimicrobials:  Anti-infectives (From admission, onward)    Start     Dose/Rate Route Frequency Ordered Stop   03/08/21 0000  linezolid (ZYVOX) 600 MG tablet       Note to Pharmacy: Stop date of 03/13/21   600 mg Oral 2 times daily 03/08/21 1135 03/13/21 2359   03/04/21 2200  cefTRIAXone (ROCEPHIN) 2 g in sodium chloride 0.9 % 100 mL IVPB  Status:  Discontinued        2 g 200 mL/hr over 30 Minutes Intravenous Every 24 hours 03/04/21 0327 03/04/21 1444   03/04/21 1600  valACYclovir (VALTREX)  tablet 1,000 mg  Status:  Discontinued        1,000 mg Oral 3 times daily 03/04/21 1444 03/04/21 1446   03/04/21 1500  valACYclovir (VALTREX) tablet 1,000 mg  Status:  Discontinued        1,000 mg Oral Daily 03/04/21 1446 03/08/21 1049   03/04/21 0300  linezolid (ZYVOX) tablet 600 mg        600 mg Oral Every 12 hours 03/04/21 0245 03/13/21 2359   03/04/21 0230  cefTRIAXone (ROCEPHIN) 2 g in sodium chloride 0.9 % 100 mL IVPB        2 g 200 mL/hr over 30 Minutes  Intravenous  Once 03/04/21 0218 03/04/21 0309        Objective   Vitals:   03/08/21 1100 03/08/21 1825 03/08/21 2044 03/09/21 1100  BP: (!) 113/55 (!) 131/58 (!) 142/66 (!) 140/56  Pulse: 71 73 68 72  Resp: 18  18 20   Temp: 98 F (36.7 C) 98.5 F (36.9 C) 98.8 F (37.1 C) 99 F (37.2 C)  TempSrc: Oral Oral Oral Oral  SpO2: 94% 97% 94% 91%  Weight:   75.9 kg   Height:        Intake/Output Summary (Last 24 hours) at 03/09/2021 1227 Last data filed at 03/09/2021 0800 Gross per 24 hour  Intake 600 ml  Output 1100 ml  Net -500 ml    Filed Weights   03/03/21 1930 03/04/21 1856 03/08/21 2044  Weight: 80 kg 72.4 kg 75.9 kg    Patient BMI: Body mass index is 29.64 kg/m.   Physical Exam: General: awake, alert, NAD HEENT: atraumatic, clear conjunctiva, anicteric sclera, moist mucus membranes, hearing grossly normal. Lesion on left cheek has purulent drainage without surrounding erythema. Non-tender. Hair is adhered in wound but dressing is not adhered today Respiratory: CTAB,  normal respiratory effort. Cardiovascular: quick capillary refill  Nervous: A&O x2. no gross focal neurologic deficits Extremities: moves all equally, no edema, normal tone. Tenderness to right knee with mild to no swelling. R thumb normal temp, tender to ROM, no erythema, generalized swelling to thumb Skin: dry, intact, normal temperature, normal color, No rashes, lesions or ulcers Psychiatry: pressured speech. Mildly anxious  Labs   I have personally reviewed following labs and imaging studies Admission on 03/03/2021  Component Date Value Ref Range Status   WBC 03/03/2021 22.6 (A) 4.0 - 10.5 K/uL Final   RBC 03/03/2021 3.94  3.87 - 5.11 MIL/uL Final   Hemoglobin 03/03/2021 12.5  12.0 - 15.0 g/dL Final   HCT 03/03/2021 38.5  36.0 - 46.0 % Final   MCV 03/03/2021 97.7  80.0 - 100.0 fL Final   MCH 03/03/2021 31.7  26.0 - 34.0 pg Final   MCHC 03/03/2021 32.5  30.0 - 36.0 g/dL Final   RDW 03/03/2021  14.6  11.5 - 15.5 % Final   Platelets 03/03/2021 281  150 - 400 K/uL Final   nRBC 03/03/2021 0.0  0.0 - 0.2 % Final   Neutrophils Relative % 03/03/2021 84  % Final   Neutro Abs 03/03/2021 19.0 (A) 1.7 - 7.7 K/uL Final   Lymphocytes Relative 03/03/2021 7  % Final   Lymphs Abs 03/03/2021 1.6  0.7 - 4.0 K/uL Final   Monocytes Relative 03/03/2021 8  % Final   Monocytes Absolute 03/03/2021 1.7 (A) 0.1 - 1.0 K/uL Final   Eosinophils Relative 03/03/2021 1  % Final   Eosinophils Absolute 03/03/2021 0.1  0.0 - 0.5 K/uL Final   Basophils  Relative 03/03/2021 0  % Final   Basophils Absolute 03/03/2021 0.1  0.0 - 0.1 K/uL Final   Immature Granulocytes 03/03/2021 0  % Final   Abs Immature Granulocytes 03/03/2021 0.10 (A) 0.00 - 0.07 K/uL Final   Sodium 03/03/2021 134 (A) 135 - 145 mmol/L Final   Potassium 03/03/2021 4.3  3.5 - 5.1 mmol/L Final   Chloride 03/03/2021 100  98 - 111 mmol/L Final   CO2 03/03/2021 27  22 - 32 mmol/L Final   Glucose, Bld 03/03/2021 184 (A) 70 - 99 mg/dL Final   BUN 03/03/2021 21  8 - 23 mg/dL Final   Creatinine, Ser 03/03/2021 1.68 (A) 0.44 - 1.00 mg/dL Final   Calcium 03/03/2021 8.7 (A) 8.9 - 10.3 mg/dL Final   Total Protein 03/03/2021 6.2 (A) 6.5 - 8.1 g/dL Final   Albumin 03/03/2021 2.9 (A) 3.5 - 5.0 g/dL Final   AST 03/03/2021 16  15 - 41 U/L Final   ALT 03/03/2021 10  0 - 44 U/L Final   Alkaline Phosphatase 03/03/2021 72  38 - 126 U/L Final   Total Bilirubin 03/03/2021 0.8  0.3 - 1.2 mg/dL Final   GFR, Estimated 03/03/2021 31 (A) >60 mL/min Final   Anion gap 03/03/2021 7  5 - 15 Final   Specimen Description 03/04/2021 BLOOD SITE NOT SPECIFIED   Final   Special Requests 03/04/2021 AEROBIC BOTTLE ONLY Blood Culture adequate volume   Final   Culture 03/04/2021    Final                   Value:NO GROWTH 5 DAYS Performed at [facility], [place] [address], [place], [place] [address]    Report Status 03/04/2021 03/09/2021 FINAL   Final   Specimen Description  03/04/2021 BLOOD LEFT ANTECUBITAL   Final   Special Requests 03/04/2021 BOTTLES DRAWN AEROBIC AND ANAEROBIC Blood Culture adequate volume   Final   Culture 03/04/2021    Final                   Value:NO GROWTH 5 DAYS Performed at [facility], [place] [address] [place], [place] [address]    Report Status 03/04/2021 03/09/2021 FINAL   Final   SARS Coronavirus 2 by RT PCR 03/04/2021 NEGATIVE  NEGATIVE Final   Influenza A by PCR 03/04/2021 NEGATIVE  NEGATIVE Final   Influenza B by PCR 03/04/2021 NEGATIVE  NEGATIVE Final   MRSA by PCR Next Gen 03/04/2021 NOT DETECTED  NOT DETECTED Final   Hgb A1c MFr Bld 03/04/2021 8.1 (A) 4.8 - 5.6 % Final   Mean Plasma Glucose 03/04/2021 185.77  mg/dL Final   WBC 03/04/2021 21.9 (A) 4.0 - 10.5 K/uL Final   RBC 03/04/2021 3.84 (A) 3.87 - 5.11 MIL/uL Final   Hemoglobin 03/04/2021 12.3  12.0 - 15.0 g/dL Final   HCT 03/04/2021 38.0  36.0 - 46.0 % Final   MCV 03/04/2021 99.0  80.0 - 100.0 fL Final   MCH 03/04/2021 32.0  26.0 - 34.0 pg Final   MCHC 03/04/2021 32.4  30.0 - 36.0 g/dL Final   RDW 03/04/2021 14.6  11.5 - 15.5 % Final   Platelets 03/04/2021 259  150 - 400 K/uL Final   nRBC 03/04/2021 0.0  0.0 - 0.2 % Final   Sodium 03/04/2021 133 (A) 135 - 145 mmol/L Final   Potassium 03/04/2021 4.5  3.5 - 5.1 mmol/L Final   Chloride 03/04/2021 98  98 - 111 mmol/L Final  CO2 03/04/2021 25  22 - 32 mmol/L Final   Glucose, Bld 03/04/2021 255 (A) 70 - 99 mg/dL Final   BUN 03/04/2021 23  8 - 23 mg/dL Final   Creatinine, Ser 03/04/2021 1.70 (A) 0.44 - 1.00 mg/dL Final   Calcium 03/04/2021 8.6 (A) 8.9 - 10.3 mg/dL Final   GFR, Estimated 03/04/2021 30 (A) >60 mL/min Final   Anion gap 03/04/2021 10  5 - 15 Final   Magnesium 03/04/2021 1.8  1.7 - 2.4 mg/dL Final   Phosphorus 03/04/2021 3.2  2.5 - 4.6 mg/dL Final   Glucose-Capillary 03/04/2021 199 (A) 70 - 99 mg/dL Final   Source of Sample 03/04/2021 PENDING   Incomplete   HSV 1 DNA 03/04/2021 Negative  Negative  Final   HSV 2 DNA 03/04/2021 Negative  Negative Final   Varicella-Zoster, PCR 03/04/2021 Negative  Negative Final   Glucose-Capillary 03/04/2021 278 (A) 70 - 99 mg/dL Final   WBC 03/05/2021 14.2 (A) 4.0 - 10.5 K/uL Final   RBC 03/05/2021 3.27 (A) 3.87 - 5.11 MIL/uL Final   Hemoglobin 03/05/2021 10.1 (A) 12.0 - 15.0 g/dL Final   HCT 03/05/2021 32.2 (A) 36.0 - 46.0 % Final   MCV 03/05/2021 98.5  80.0 - 100.0 fL Final   MCH 03/05/2021 30.9  26.0 - 34.0 pg Final   MCHC 03/05/2021 31.4  30.0 - 36.0 g/dL Final   RDW 03/05/2021 14.3  11.5 - 15.5 % Final   Platelets 03/05/2021 244  150 - 400 K/uL Final   nRBC 03/05/2021 0.0  0.0 - 0.2 % Final   Glucose-Capillary 03/05/2021 115 (A) 70 - 99 mg/dL Final   Glucose-Capillary 03/05/2021 335 (A) 70 - 99 mg/dL Final   Glucose-Capillary 03/05/2021 251 (A) 70 - 99 mg/dL Final   WBC 03/06/2021 13.9 (A) 4.0 - 10.5 K/uL Final   RBC 03/06/2021 3.55 (A) 3.87 - 5.11 MIL/uL Final   Hemoglobin 03/06/2021 11.1 (A) 12.0 - 15.0 g/dL Final   HCT 03/06/2021 34.7 (A) 36.0 - 46.0 % Final   MCV 03/06/2021 97.7  80.0 - 100.0 fL Final   MCH 03/06/2021 31.3  26.0 - 34.0 pg Final   MCHC 03/06/2021 32.0  30.0 - 36.0 g/dL Final   RDW 03/06/2021 14.5  11.5 - 15.5 % Final   Platelets 03/06/2021 290  150 - 400 K/uL Final   nRBC 03/06/2021 0.0  0.0 - 0.2 % Final   Neutrophils Relative % 03/06/2021 79  % Final   Neutro Abs 03/06/2021 11.0 (A) 1.7 - 7.7 K/uL Final   Lymphocytes Relative 03/06/2021 8  % Final   Lymphs Abs 03/06/2021 1.1  0.7 - 4.0 K/uL Final   Monocytes Relative 03/06/2021 9  % Final   Monocytes Absolute 03/06/2021 1.2 (A) 0.1 - 1.0 K/uL Final   Eosinophils Relative 03/06/2021 3  % Final   Eosinophils Absolute 03/06/2021 0.4  0.0 - 0.5 K/uL Final   Basophils Relative 03/06/2021 0  % Final   Basophils Absolute 03/06/2021 0.0  0.0 - 0.1 K/uL Final   Immature Granulocytes 03/06/2021 1  % Final   Abs Immature Granulocytes 03/06/2021 0.15 (A) 0.00 - 0.07 K/uL  Final   Sodium 03/06/2021 128 (A) 135 - 145 mmol/L Final   Potassium 03/06/2021 4.8  3.5 - 5.1 mmol/L Final   Chloride 03/06/2021 98  98 - 111 mmol/L Final   CO2 03/06/2021 23  22 - 32 mmol/L Final   Glucose, Bld 03/06/2021 177 (A) 70 - 99 mg/dL Final  BUN 03/06/2021 23  8 - 23 mg/dL Final   Creatinine, Ser 03/06/2021 1.72 (A) 0.44 - 1.00 mg/dL Final   Calcium 03/06/2021 8.6 (A) 8.9 - 10.3 mg/dL Final   GFR, Estimated 03/06/2021 30 (A) >60 mL/min Final   Anion gap 03/06/2021 7  5 - 15 Final   Magnesium 03/06/2021 1.7  1.7 - 2.4 mg/dL Final   Glucose-Capillary 03/05/2021 220 (A) 70 - 99 mg/dL Final   Glucose-Capillary 03/06/2021 172 (A) 70 - 99 mg/dL Final   Glucose-Capillary 03/06/2021 239 (A) 70 - 99 mg/dL Final   Glucose-Capillary 03/06/2021 113 (A) 70 - 99 mg/dL Final   Glucose-Capillary 03/06/2021 134 (A) 70 - 99 mg/dL Final   Glucose-Capillary 03/07/2021 113 (A) 70 - 99 mg/dL Final   Glucose-Capillary 03/04/2021 300 (A) 70 - 99 mg/dL Final   Comment 1 03/04/2021 Notify RN   Final   Glucose-Capillary 03/07/2021 190 (A) 70 - 99 mg/dL Final   Glucose-Capillary 03/07/2021 276 (A) 70 - 99 mg/dL Final   Sodium 03/08/2021 134 (A) 135 - 145 mmol/L Final   Potassium 03/08/2021 5.0  3.5 - 5.1 mmol/L Final   Chloride 03/08/2021 101  98 - 111 mmol/L Final   CO2 03/08/2021 27  22 - 32 mmol/L Final   Glucose, Bld 03/08/2021 168 (A) 70 - 99 mg/dL Final   BUN 03/08/2021 17  8 - 23 mg/dL Final   Creatinine, Ser 03/08/2021 1.28 (A) 0.44 - 1.00 mg/dL Final   Calcium 03/08/2021 8.6 (A) 8.9 - 10.3 mg/dL Final   GFR, Estimated 03/08/2021 43 (A) >60 mL/min Final   Anion gap 03/08/2021 6  5 - 15 Final   Glucose-Capillary 03/08/2021 91  70 - 99 mg/dL Final   Comment 1 03/08/2021 QC Due   Final   Glucose-Capillary 03/08/2021 154 (A) 70 - 99 mg/dL Final   Glucose-Capillary 03/08/2021 172 (A) 70 - 99 mg/dL Final   Glucose-Capillary 03/08/2021 210 (A) 70 - 99 mg/dL Final   Glucose-Capillary 03/09/2021  81  70 - 99 mg/dL Final   Glucose-Capillary 03/09/2021 225 (A) 70 - 99 mg/dL Final     Recent Results (from the past 240 hour(s))  Resp Panel by RT-PCR (Flu A&B, Covid) Nasopharyngeal Swab     Status: None   Collection Time: 03/04/21 12:11 AM   Specimen: Nasopharyngeal Swab; Nasopharyngeal(NP) swabs in vial transport medium  Result Value Ref Range Status   SARS Coronavirus 2 by RT PCR NEGATIVE NEGATIVE Final    Comment: (NOTE) SARS-CoV-2 target nucleic acids are NOT DETECTED.  The SARS-CoV-2 RNA is generally detectable in upper respiratory specimens during the acute phase of infection. The lowest concentration of SARS-CoV-2 viral copies this assay can detect is 138 copies/mL. A negative result does not preclude SARS-Cov-2 infection and should not be used as the sole basis for treatment or other patient management decisions. A negative result may occur with  improper specimen collection/handling, submission of specimen other than nasopharyngeal swab, presence of viral mutation(s) within the areas targeted by this assay, and inadequate number of viral copies(<138 copies/mL). A negative result must be combined with clinical observations, patient history, and epidemiological information. The expected result is Negative.  Fact Sheet for Patients:  EntrepreneurPulse.com.au  Fact Sheet for Healthcare Providers:  IncredibleEmployment.be  This test is no t yet approved or cleared by the [place] FDA and  has been authorized for detection and/or diagnosis of SARS-CoV-2 by FDA under an Emergency Use Authorization (EUA). This EUA will remain  in effect (  meaning this test can be used) for the duration of the COVID-19 declaration under Section 564(b)(1) of the Act, 21 U.S.C.section 360bbb-3(b)(1), unless the authorization is terminated  or revoked sooner.       Influenza A by PCR NEGATIVE NEGATIVE Final   Influenza B by PCR NEGATIVE NEGATIVE Final     Comment: (NOTE) The Xpert Xpress SARS-CoV-2/FLU/RSV plus assay is intended as an aid in the diagnosis of influenza from Nasopharyngeal swab specimens and should not be used as a sole basis for treatment. Nasal washings and aspirates are unacceptable for Xpert Xpress SARS-CoV-2/FLU/RSV testing.  Fact Sheet for Patients: EntrepreneurPulse.com.au  Fact Sheet for Healthcare Providers: IncredibleEmployment.be  This test is not yet approved or cleared by the [place] FDA and has been authorized for detection and/or diagnosis of SARS-CoV-2 by FDA under an Emergency Use Authorization (EUA). This EUA will remain in effect (meaning this test can be used) for the duration of the COVID-19 declaration under Section 564(b)(1) of the Act, 21 U.S.C. section 360bbb-3(b)(1), unless the authorization is terminated or revoked.  Performed at [facility], [place] [address] [place], [place] [address]   Blood culture (routine x 2)     Status: None   Collection Time: 03/04/21  1:00 AM   Specimen: BLOOD  Result Value Ref Range Status   Specimen Description BLOOD SITE NOT SPECIFIED  Final   Special Requests AEROBIC BOTTLE ONLY Blood Culture adequate volume  Final   Culture   Final    NO GROWTH 5 DAYS Performed at [place] [address] [place], [place] [address]    Report Status 03/09/2021 FINAL  Final  Blood culture (routine x 2)     Status: None   Collection Time: 03/04/21  1:10 AM   Specimen: BLOOD  Result Value Ref Range Status   Specimen Description BLOOD LEFT ANTECUBITAL  Final   Special Requests   Final    BOTTLES DRAWN AEROBIC AND ANAEROBIC Blood Culture adequate volume   Culture   Final    NO GROWTH 5 DAYS Performed at [facility], [place] [address] [place], [place] [address]    Report Status 03/09/2021 FINAL  Final  MRSA Next Gen by PCR, Nasal     Status: None   Collection Time: 03/04/21  3:17 AM   Specimen: Nasal  Mucosa; Nasal Swab  Result Value Ref Range Status   MRSA by PCR Next Gen NOT DETECTED NOT DETECTED Final    Comment: (NOTE) The GeneXpert MRSA Assay (FDA approved for NASAL specimens only), is one component of a comprehensive MRSA colonization surveillance program. It is not intended to diagnose MRSA infection nor to guide or monitor treatment for MRSA infections. Test performance is not FDA approved in patients less than 61 years old. Performed at [facility], [place] [address], [place], [place] [address]   Varicella-zoster by PCR (Blood or Swab)     Status: None   Collection Time: 03/04/21 12:31 PM   Specimen: Nasal Mucosa; Sterile Swab  Result Value Ref Range Status   Varicella-Zoster, PCR Negative Negative Final    Comment: (NOTE) No Varicella Zoster Virus DNA detected. This test was developed and its performance characteristics determined by [facility].  It has not been cleared or approved by the Food and Drug Administration.  The FDA has determined that such clearance or approval is not necessary. Performed At: [facility] [place], [place] [id number] Rush Farmer MD [id number]  Imaging Studies  No results found. Medications   Scheduled Meds:  allopurinol  100 mg Oral Daily   amLODipine  10 mg Oral Daily   apixaban  5 mg Oral BID   atorvastatin  10 mg Oral Daily   carvedilol  25 mg Oral BID WC   colchicine  0.3 mg Oral Daily   diclofenac Sodium  2 g Topical QID   DULoxetine  60 mg Oral Daily   gabapentin  200 mg Oral QHS   insulin aspart  0-5 Units Subcutaneous QHS   insulin aspart  0-9 Units Subcutaneous TID WC   insulin glargine-yfgn  20 Units Subcutaneous Daily   linezolid  600 mg Oral Q12H   Continuous Infusions:   LOS: 5 days   Time spent: >35 min   Richarda Osmond, DO [facility] 03/09/2021, 12:27 PM   To contact the [facility] Attending or Consulting provider for this patient: Check the care team in [facility] for a)  attending/consulting [place] provider listed and b) the [facility] team listed Log into [email or web address] and use [facility]'s universal password to access. If you do not have the password, please contact the hospital operator. Locate the [facility] provider you are looking for under [facility] and page to a number that you can be directly reached. If you still have difficulty reaching the provider, please page the [facility] (Director on Call) for the Hospitalists listed on amion for assistance.

## 2021-03-10 ENCOUNTER — Ambulatory Visit: Payer: Medicare Other | Admitting: Internal Medicine

## 2021-03-10 DIAGNOSIS — M1000 Idiopathic gout, unspecified site: Secondary | ICD-10-CM | POA: Diagnosis not present

## 2021-03-10 DIAGNOSIS — L03211 Cellulitis of face: Secondary | ICD-10-CM | POA: Diagnosis not present

## 2021-03-10 LAB — BASIC METABOLIC PANEL
Anion gap: 6 (ref 5–15)
BUN: 13 mg/dL (ref 8–23)
CO2: 28 mmol/L (ref 22–32)
Calcium: 8.8 mg/dL — ABNORMAL LOW (ref 8.9–10.3)
Chloride: 98 mmol/L (ref 98–111)
Creatinine, Ser: 1.22 mg/dL — ABNORMAL HIGH (ref 0.44–1.00)
GFR, Estimated: 45 mL/min — ABNORMAL LOW (ref >60)
Glucose, Bld: 124 mg/dL — ABNORMAL HIGH (ref 70–99)
Potassium: 5 mmol/L (ref 3.5–5.1)
Sodium: 132 mmol/L — ABNORMAL LOW (ref 135–145)

## 2021-03-10 LAB — GLUCOSE, CAPILLARY
Glucose-Capillary: 119 mg/dL — ABNORMAL HIGH (ref 70–99)
Glucose-Capillary: 176 mg/dL — ABNORMAL HIGH (ref 70–99)
Glucose-Capillary: 138 mg/dL — ABNORMAL HIGH (ref 70–99)
Glucose-Capillary: 162 mg/dL — ABNORMAL HIGH (ref 70–99)

## 2021-03-10 MED ORDER — OXYCODONE HCL 5 MG PO TABS: 5.0000 mg | ORAL_TABLET | Freq: Four times a day (QID) | ORAL | 0 refills | Status: AC | PRN

## 2021-03-10 NOTE — Discharge Instructions (Addendum)
Please follow up with infectious disease in 1 week to monitor the healing of the infection on your face. Follow up with your rheumatologist for gout management. You have been discharged with all the medications to continue your treatments   ============================================================================================================== Information on my medicine - ELIQUIS (apixaban)  Why was Eliquis prescribed for you? Eliquis was prescribed for you to reduce the risk of a blood clot forming that can cause a stroke if you have a medical condition called atrial fibrillation (a type of irregular heartbeat).  What do You need to know about Eliquis ? Take your Eliquis TWICE DAILY - one tablet in the morning and one tablet in the evening with or without food. If you have difficulty swallowing the tablet whole please discuss with your pharmacist how to take the medication safely.  Take Eliquis exactly as prescribed by your doctor and DO NOT stop taking Eliquis without talking to the doctor who prescribed the medication.  Stopping may increase your risk of developing a stroke.  Refill your prescription before you run out.  After discharge, you should have regular check-up appointments with your healthcare provider that is prescribing your Eliquis.  In the future your dose may need to be changed if your kidney function or weight changes by a significant amount or as you get older.  What do you do if you miss a dose? If you miss a dose, take it as soon as you remember on the same day and resume taking twice daily.  Do not take more than one dose of ELIQUIS at the same time to make up a missed dose.  Important Safety Information A possible side effect of Eliquis is bleeding. You should call your healthcare provider right away if you experience any of the following: Bleeding from an injury or your nose that does not stop. Unusual colored urine (red or dark brown) or unusual colored  stools (red or black). Unusual bruising for unknown reasons. A serious fall or if you hit your head (even if there is no bleeding).  Some medicines may interact with Eliquis and might increase your risk of bleeding or clotting while on Eliquis. To help avoid this, consult your healthcare provider or pharmacist prior to using any new prescription or non-prescription medications, including herbals, vitamins, non-steroidal anti-inflammatory drugs (NSAIDs) and supplements.  This website has more information on Eliquis (apixaban): http://www.eliquis.com/eliquis/home

## 2021-03-10 NOTE — Progress Notes (Signed)
PROGRESS NOTE  Sabrina Baker    DOB: 1940-07-25, 80 y.o.  QQI:[id number]  PCP: [name], MD   Code Status: Full Code   DOA: 03/03/2021   LOS: 6  Brief Narrative of Current Hospitalization  Sabrina Baker is a 80 y.o. female with a PMH significant for insulin-dependent type 2 diabetes, Charcot foot, CKD stage IIIb, A. 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 They presented from home to the ED on 03/03/2021 with swelling, erythema, purulence from left cheek x 3 days. In the ED, it was found that they had cellulitis as demonstrated on maxillofacial CT. They were treated with IV antibiotics.  ENT, rheumatology and ID were also consulted. Patient was admitted to medicine service for further workup and management of cellulitis as outlined in detail below.  03/10/21 -stable, pain poorly controlled  Assessment & Plan  Active Problems:   Facial cellulitis   Acute idiopathic gout  Polyarthritis/acute gouty attack on right thumb and knee- remains for pain control but endorses that it is improving. Unable to participate with PT today due to pain. So, PT now recommending SNF. Patient strongly declines SNF or HHPT. She wishes to go home tomorrow when her knee pain might feel better.  -Continue allopurinol, colchicine -Rheumatology OP - continue PRN oxy IR and tylenol  Facial cellulitis/carbuncle- afebrile, improving.  Blood cultures negative. Patient has no complaints on her face. -ID following      -Continue linezolid until 10/2 - ENT following- signed off for dc - WOC s/o - pain control PRN  Hyponatremia- stable at 132 - encourage PO intake, fluid restriction - strict I/O - BMP am  AKI on CKD stage IIIb- Cr improved to 1.28 and is at baseline  Insulin dependent Type 2 diabetes- fasting glucose this am 119. Hgb A1c was 8.1 on admission. Takes trulicity at home as well. - continue sSSI - decrease home Semglee to 10units daily am  starting tomorrow -Continue atorvastatin  HTN- chronic, stable -Continue amlodipine, carvedilol  A. fib-rate controlled -Continue Eliquis -Continue Coreg  Anxiety/depression- -Continue duloxetine  DVT prophylaxis: SCDs Start: 03/04/21 0244 apixaban (ELIQUIS) tablet 5 mg   Diet:  Diet Orders (From admission, onward)     Start     Ordered   03/05/21 2341  Diet heart healthy/carb modified Room service appropriate? Yes; Fluid consistency: Thin  Diet effective now       Comments: Patient is Vegetarian.  Question Answer Comment  Diet-HS Snack? Nothing   Room service appropriate? Yes   Fluid consistency: Thin      03/05/21 2340            Subjective 03/10/21    Pt was initially ready to go home today but doesn't feel that she can move around much with the pain in her knees so would like to stay one more day.   Disposition Plan & Communication  Status is: Inpatient  Remains inpatient appropriate because:IV treatments appropriate due to intensity of illness or inability to take PO  Dispo:  Patient From: Home  Planned Disposition: Home 9/30 pending pain control  Medically stable for discharge: No     Family Communication: none   Consults, Procedures, Significant Events  Consultants:  ID Rheumatology ENT  Procedures/significant events:  none  Antimicrobials:  Anti-infectives (From admission, onward)    Start     Dose/Rate Route Frequency Ordered Stop   03/08/21 0000  linezolid (ZYVOX) 600 MG tablet  Note to Pharmacy: Stop date of 03/13/21   600 mg Oral 2 times daily 03/08/21 1135 03/13/21 2359   03/04/21 2200  cefTRIAXone (ROCEPHIN) 2 g in sodium chloride 0.9 % 100 mL IVPB  Status:  Discontinued        2 g 200 mL/hr over 30 Minutes Intravenous Every 24 hours 03/04/21 0327 03/04/21 1444   03/04/21 1600  valACYclovir (VALTREX) tablet 1,000 mg  Status:  Discontinued        1,000 mg Oral 3 times daily 03/04/21 1444 03/04/21 1446   03/04/21 1500   valACYclovir (VALTREX) tablet 1,000 mg  Status:  Discontinued        1,000 mg Oral Daily 03/04/21 1446 03/08/21 1049   03/04/21 0300  linezolid (ZYVOX) tablet 600 mg        600 mg Oral Every 12 hours 03/04/21 0245 03/13/21 2359   03/04/21 0230  cefTRIAXone (ROCEPHIN) 2 g in sodium chloride 0.9 % 100 mL IVPB        2 g 200 mL/hr over 30 Minutes Intravenous  Once 03/04/21 0218 03/04/21 0309        Objective   Vitals:   03/09/21 1100 03/09/21 2142 03/10/21 0540 03/10/21 0844  BP: (!) 140/56 131/67 137/74 (!) 155/71  Pulse: 72 77 71 76  Resp: 20 18 18 19   Temp: 99 F (37.2 C) 98.6 F (37 C) 98.4 F (36.9 C) 98.4 F (36.9 C)  TempSrc: Oral Oral Oral   SpO2: 91% 95% 96% 97%  Weight:      Height:        Intake/Output Summary (Last 24 hours) at 03/10/2021 1543 Last data filed at 03/10/2021 1514 Gross per 24 hour  Intake 1020 ml  Output 400 ml  Net 620 ml    Filed Weights   03/03/21 1930 03/04/21 1856 03/08/21 2044  Weight: 80 kg 72.4 kg 75.9 kg    Patient BMI: Body mass index is 29.64 kg/m.   Physical Exam: General: awake, alert, NAD HEENT: atraumatic, clear conjunctiva, anicteric sclera, moist mucus membranes, hearing grossly normal. Lesion on left cheek has purulent drainage without surrounding erythema. Non-tender. Hair is adhered in wound but dressing is not adhered today Respiratory: CTAB,  normal respiratory effort. Cardiovascular: quick capillary refill  Nervous: A&O x2. no gross focal neurologic deficits Extremities: moves all equally, no edema, normal tone. Tenderness to right knee with mild to no swelling. R thumb normal temp, tender to ROM, no erythema, generalized swelling to thumb Skin: dry, intact, normal temperature, normal color, No rashes, lesions or ulcers Psychiatry: pressured speech. Mildly anxious  Labs   I have personally reviewed following labs and imaging studies Admission on 03/03/2021  Component Date Value Ref Range Status   WBC 03/03/2021 22.6  (A) 4.0 - 10.5 K/uL Final   RBC 03/03/2021 3.94  3.87 - 5.11 MIL/uL Final   Hemoglobin 03/03/2021 12.5  12.0 - 15.0 g/dL Final   HCT 03/03/2021 38.5  36.0 - 46.0 % Final   MCV 03/03/2021 97.7  80.0 - 100.0 fL Final   MCH 03/03/2021 31.7  26.0 - 34.0 pg Final   MCHC 03/03/2021 32.5  30.0 - 36.0 g/dL Final   RDW 03/03/2021 14.6  11.5 - 15.5 % Final   Platelets 03/03/2021 281  150 - 400 K/uL Final   nRBC 03/03/2021 0.0  0.0 - 0.2 % Final   Neutrophils Relative % 03/03/2021 84  % Final   Neutro Abs 03/03/2021 19.0 (A) 1.7 - 7.7 K/uL Final  Lymphocytes Relative 03/03/2021 7  % Final   Lymphs Abs 03/03/2021 1.6  0.7 - 4.0 K/uL Final   Monocytes Relative 03/03/2021 8  % Final   Monocytes Absolute 03/03/2021 1.7 (A) 0.1 - 1.0 K/uL Final   Eosinophils Relative 03/03/2021 1  % Final   Eosinophils Absolute 03/03/2021 0.1  0.0 - 0.5 K/uL Final   Basophils Relative 03/03/2021 0  % Final   Basophils Absolute 03/03/2021 0.1  0.0 - 0.1 K/uL Final   Immature Granulocytes 03/03/2021 0  % Final   Abs Immature Granulocytes 03/03/2021 0.10 (A) 0.00 - 0.07 K/uL Final   Sodium 03/03/2021 134 (A) 135 - 145 mmol/L Final   Potassium 03/03/2021 4.3  3.5 - 5.1 mmol/L Final   Chloride 03/03/2021 100  98 - 111 mmol/L Final   CO2 03/03/2021 27  22 - 32 mmol/L Final   Glucose, Bld 03/03/2021 184 (A) 70 - 99 mg/dL Final   BUN 03/03/2021 21  8 - 23 mg/dL Final   Creatinine, Ser 03/03/2021 1.68 (A) 0.44 - 1.00 mg/dL Final   Calcium 03/03/2021 8.7 (A) 8.9 - 10.3 mg/dL Final   Total Protein 03/03/2021 6.2 (A) 6.5 - 8.1 g/dL Final   Albumin 03/03/2021 2.9 (A) 3.5 - 5.0 g/dL Final   AST 03/03/2021 16  15 - 41 U/L Final   ALT 03/03/2021 10  0 - 44 U/L Final   Alkaline Phosphatase 03/03/2021 72  38 - 126 U/L Final   Total Bilirubin 03/03/2021 0.8  0.3 - 1.2 mg/dL Final   GFR, Estimated 03/03/2021 31 (A) >60 mL/min Final   Anion gap 03/03/2021 7  5 - 15 Final   Specimen Description 03/04/2021 BLOOD SITE NOT SPECIFIED    Final   Special Requests 03/04/2021 AEROBIC BOTTLE ONLY Blood Culture adequate volume   Final   Culture 03/04/2021    Final                   Value:NO GROWTH 5 DAYS Performed at [facility], [place] [address] [place], [place] [address]    Report Status 03/04/2021 03/09/2021 FINAL   Final   Specimen Description 03/04/2021 BLOOD LEFT ANTECUBITAL   Final   Special Requests 03/04/2021 BOTTLES DRAWN AEROBIC AND ANAEROBIC Blood Culture adequate volume   Final   Culture 03/04/2021    Final                   Value:NO GROWTH 5 DAYS Performed at [facility], [place] [address], [place], [place] [address]    Report Status 03/04/2021 03/09/2021 FINAL   Final   SARS Coronavirus 2 by RT PCR 03/04/2021 NEGATIVE  NEGATIVE Final   Influenza A by PCR 03/04/2021 NEGATIVE  NEGATIVE Final   Influenza B by PCR 03/04/2021 NEGATIVE  NEGATIVE Final   MRSA by PCR Next Gen 03/04/2021 NOT DETECTED  NOT DETECTED Final   Hgb A1c MFr Bld 03/04/2021 8.1 (A) 4.8 - 5.6 % Final   Mean Plasma Glucose 03/04/2021 185.77  mg/dL Final   WBC 03/04/2021 21.9 (A) 4.0 - 10.5 K/uL Final   RBC 03/04/2021 3.84 (A) 3.87 - 5.11 MIL/uL Final   Hemoglobin 03/04/2021 12.3  12.0 - 15.0 g/dL Final   HCT 03/04/2021 38.0  36.0 - 46.0 % Final   MCV 03/04/2021 99.0  80.0 - 100.0 fL Final   MCH 03/04/2021 32.0  26.0 - 34.0 pg Final   MCHC 03/04/2021 32.4  30.0 - 36.0 g/dL Final  RDW 03/04/2021 14.6  11.5 - 15.5 % Final   Platelets 03/04/2021 259  150 - 400 K/uL Final   nRBC 03/04/2021 0.0  0.0 - 0.2 % Final   Sodium 03/04/2021 133 (A) 135 - 145 mmol/L Final   Potassium 03/04/2021 4.5  3.5 - 5.1 mmol/L Final   Chloride 03/04/2021 98  98 - 111 mmol/L Final   CO2 03/04/2021 25  22 - 32 mmol/L Final   Glucose, Bld 03/04/2021 255 (A) 70 - 99 mg/dL Final   BUN 03/04/2021 23  8 - 23 mg/dL Final   Creatinine, Ser 03/04/2021 1.70 (A) 0.44 - 1.00 mg/dL Final   Calcium 03/04/2021 8.6 (A) 8.9 - 10.3 mg/dL Final   GFR, Estimated 03/04/2021  30 (A) >60 mL/min Final   Anion gap 03/04/2021 10  5 - 15 Final   Magnesium 03/04/2021 1.8  1.7 - 2.4 mg/dL Final   Phosphorus 03/04/2021 3.2  2.5 - 4.6 mg/dL Final   Glucose-Capillary 03/04/2021 199 (A) 70 - 99 mg/dL Final   Source of Sample 03/04/2021 PENDING   Incomplete   HSV 1 DNA 03/04/2021 Negative  Negative Final   HSV 2 DNA 03/04/2021 Negative  Negative Final   Varicella-Zoster, PCR 03/04/2021 Negative  Negative Final   Glucose-Capillary 03/04/2021 278 (A) 70 - 99 mg/dL Final   WBC 03/05/2021 14.2 (A) 4.0 - 10.5 K/uL Final   RBC 03/05/2021 3.27 (A) 3.87 - 5.11 MIL/uL Final   Hemoglobin 03/05/2021 10.1 (A) 12.0 - 15.0 g/dL Final   HCT 03/05/2021 32.2 (A) 36.0 - 46.0 % Final   MCV 03/05/2021 98.5  80.0 - 100.0 fL Final   MCH 03/05/2021 30.9  26.0 - 34.0 pg Final   MCHC 03/05/2021 31.4  30.0 - 36.0 g/dL Final   RDW 03/05/2021 14.3  11.5 - 15.5 % Final   Platelets 03/05/2021 244  150 - 400 K/uL Final   nRBC 03/05/2021 0.0  0.0 - 0.2 % Final   Glucose-Capillary 03/05/2021 115 (A) 70 - 99 mg/dL Final   Glucose-Capillary 03/05/2021 335 (A) 70 - 99 mg/dL Final   Glucose-Capillary 03/05/2021 251 (A) 70 - 99 mg/dL Final   WBC 03/06/2021 13.9 (A) 4.0 - 10.5 K/uL Final   RBC 03/06/2021 3.55 (A) 3.87 - 5.11 MIL/uL Final   Hemoglobin 03/06/2021 11.1 (A) 12.0 - 15.0 g/dL Final   HCT 03/06/2021 34.7 (A) 36.0 - 46.0 % Final   MCV 03/06/2021 97.7  80.0 - 100.0 fL Final   MCH 03/06/2021 31.3  26.0 - 34.0 pg Final   MCHC 03/06/2021 32.0  30.0 - 36.0 g/dL Final   RDW 03/06/2021 14.5  11.5 - 15.5 % Final   Platelets 03/06/2021 290  150 - 400 K/uL Final   nRBC 03/06/2021 0.0  0.0 - 0.2 % Final   Neutrophils Relative % 03/06/2021 79  % Final   Neutro Abs 03/06/2021 11.0 (A) 1.7 - 7.7 K/uL Final   Lymphocytes Relative 03/06/2021 8  % Final   Lymphs Abs 03/06/2021 1.1  0.7 - 4.0 K/uL Final   Monocytes Relative 03/06/2021 9  % Final   Monocytes Absolute 03/06/2021 1.2 (A) 0.1 - 1.0 K/uL Final    Eosinophils Relative 03/06/2021 3  % Final   Eosinophils Absolute 03/06/2021 0.4  0.0 - 0.5 K/uL Final   Basophils Relative 03/06/2021 0  % Final   Basophils Absolute 03/06/2021 0.0  0.0 - 0.1 K/uL Final   Immature Granulocytes 03/06/2021 1  % Final   Abs Immature Granulocytes  03/06/2021 0.15 (A) 0.00 - 0.07 K/uL Final   Sodium 03/06/2021 128 (A) 135 - 145 mmol/L Final   Potassium 03/06/2021 4.8  3.5 - 5.1 mmol/L Final   Chloride 03/06/2021 98  98 - 111 mmol/L Final   CO2 03/06/2021 23  22 - 32 mmol/L Final   Glucose, Bld 03/06/2021 177 (A) 70 - 99 mg/dL Final   BUN 03/06/2021 23  8 - 23 mg/dL Final   Creatinine, Ser 03/06/2021 1.72 (A) 0.44 - 1.00 mg/dL Final   Calcium 03/06/2021 8.6 (A) 8.9 - 10.3 mg/dL Final   GFR, Estimated 03/06/2021 30 (A) >60 mL/min Final   Anion gap 03/06/2021 7  5 - 15 Final   Magnesium 03/06/2021 1.7  1.7 - 2.4 mg/dL Final   Glucose-Capillary 03/05/2021 220 (A) 70 - 99 mg/dL Final   Glucose-Capillary 03/06/2021 172 (A) 70 - 99 mg/dL Final   Glucose-Capillary 03/06/2021 239 (A) 70 - 99 mg/dL Final   Glucose-Capillary 03/06/2021 113 (A) 70 - 99 mg/dL Final   Glucose-Capillary 03/06/2021 134 (A) 70 - 99 mg/dL Final   Glucose-Capillary 03/07/2021 113 (A) 70 - 99 mg/dL Final   Glucose-Capillary 03/04/2021 300 (A) 70 - 99 mg/dL Final   Comment 1 03/04/2021 Notify RN   Final   Glucose-Capillary 03/07/2021 190 (A) 70 - 99 mg/dL Final   Glucose-Capillary 03/07/2021 276 (A) 70 - 99 mg/dL Final   Sodium 03/08/2021 134 (A) 135 - 145 mmol/L Final   Potassium 03/08/2021 5.0  3.5 - 5.1 mmol/L Final   Chloride 03/08/2021 101  98 - 111 mmol/L Final   CO2 03/08/2021 27  22 - 32 mmol/L Final   Glucose, Bld 03/08/2021 168 (A) 70 - 99 mg/dL Final   BUN 03/08/2021 17  8 - 23 mg/dL Final   Creatinine, Ser 03/08/2021 1.28 (A) 0.44 - 1.00 mg/dL Final   Calcium 03/08/2021 8.6 (A) 8.9 - 10.3 mg/dL Final   GFR, Estimated 03/08/2021 43 (A) >60 mL/min Final   Anion gap 03/08/2021 6   5 - 15 Final   Glucose-Capillary 03/08/2021 91  70 - 99 mg/dL Final   Comment 1 03/08/2021 QC Due   Final   Glucose-Capillary 03/08/2021 154 (A) 70 - 99 mg/dL Final   Glucose-Capillary 03/08/2021 172 (A) 70 - 99 mg/dL Final   Glucose-Capillary 03/08/2021 210 (A) 70 - 99 mg/dL Final   Glucose-Capillary 03/09/2021 81  70 - 99 mg/dL Final   Glucose-Capillary 03/09/2021 225 (A) 70 - 99 mg/dL Final   Glucose-Capillary 03/09/2021 196 (A) 70 - 99 mg/dL Final   Sodium 03/10/2021 132 (A) 135 - 145 mmol/L Final   Potassium 03/10/2021 5.0  3.5 - 5.1 mmol/L Final   Chloride 03/10/2021 98  98 - 111 mmol/L Final   CO2 03/10/2021 28  22 - 32 mmol/L Final   Glucose, Bld 03/10/2021 124 (A) 70 - 99 mg/dL Final   BUN 03/10/2021 13  8 - 23 mg/dL Final   Creatinine, Ser 03/10/2021 1.22 (A) 0.44 - 1.00 mg/dL Final   Calcium 03/10/2021 8.8 (A) 8.9 - 10.3 mg/dL Final   GFR, Estimated 03/10/2021 45 (A) >60 mL/min Final   Anion gap 03/10/2021 6  5 - 15 Final   Glucose-Capillary 03/09/2021 193 (A) 70 - 99 mg/dL Final   Comment 1 03/09/2021 Notify RN   Final   Comment 2 03/09/2021 Document in Chart   Final   Glucose-Capillary 03/10/2021 119 (A) 70 - 99 mg/dL Final   Glucose-Capillary 03/10/2021 176 (A) 70 -  99 mg/dL Final     Recent Results (from the past 240 hour(s))  Resp Panel by RT-PCR (Flu A&B, Covid) Nasopharyngeal Swab     Status: None   Collection Time: 03/04/21 12:11 AM   Specimen: Nasopharyngeal Swab; Nasopharyngeal(NP) swabs in vial transport medium  Result Value Ref Range Status   SARS Coronavirus 2 by RT PCR NEGATIVE NEGATIVE Final    Comment: (NOTE) SARS-CoV-2 target nucleic acids are NOT DETECTED.  The SARS-CoV-2 RNA is generally detectable in upper respiratory specimens during the acute phase of infection. The lowest concentration of SARS-CoV-2 viral copies this assay can detect is 138 copies/mL. A negative result does not preclude SARS-Cov-2 infection and should not be used as the sole  basis for treatment or other patient management decisions. A negative result may occur with  improper specimen collection/handling, submission of specimen other than nasopharyngeal swab, presence of viral mutation(s) within the areas targeted by this assay, and inadequate number of viral copies(<138 copies/mL). A negative result must be combined with clinical observations, patient history, and epidemiological information. The expected result is Negative.  Fact Sheet for Patients:  EntrepreneurPulse.com.au  Fact Sheet for Healthcare Providers:  IncredibleEmployment.be  This test is no t yet approved or cleared by the [place] FDA and  has been authorized for detection and/or diagnosis of SARS-CoV-2 by FDA under an Emergency Use Authorization (EUA). This EUA will remain  in effect (meaning this test can be used) for the duration of the COVID-19 declaration under Section 564(b)(1) of the Act, 21 U.S.C.section 360bbb-3(b)(1), unless the authorization is terminated  or revoked sooner.       Influenza A by PCR NEGATIVE NEGATIVE Final   Influenza B by PCR NEGATIVE NEGATIVE Final    Comment: (NOTE) The Xpert Xpress SARS-CoV-2/FLU/RSV plus assay is intended as an aid in the diagnosis of influenza from Nasopharyngeal swab specimens and should not be used as a sole basis for treatment. Nasal washings and aspirates are unacceptable for Xpert Xpress SARS-CoV-2/FLU/RSV testing.  Fact Sheet for Patients: EntrepreneurPulse.com.au  Fact Sheet for Healthcare Providers: IncredibleEmployment.be  This test is not yet approved or cleared by the [place] FDA and has been authorized for detection and/or diagnosis of SARS-CoV-2 by FDA under an Emergency Use Authorization (EUA). This EUA will remain in effect (meaning this test can be used) for the duration of the COVID-19 declaration under Section 564(b)(1) of the Act,  21 U.S.C. section 360bbb-3(b)(1), unless the authorization is terminated or revoked.  Performed at [facility], [place] [address], [place], [place] [address]   Blood culture (routine x 2)     Status: None   Collection Time: 03/04/21  1:00 AM   Specimen: BLOOD  Result Value Ref Range Status   Specimen Description BLOOD SITE NOT SPECIFIED  Final   Special Requests AEROBIC BOTTLE ONLY Blood Culture adequate volume  Final   Culture   Final    NO GROWTH 5 DAYS Performed at [place] [address] [place], [place] [address]    Report Status 03/09/2021 FINAL  Final  Blood culture (routine x 2)     Status: None   Collection Time: 03/04/21  1:10 AM   Specimen: BLOOD  Result Value Ref Range Status   Specimen Description BLOOD LEFT ANTECUBITAL  Final   Special Requests   Final    BOTTLES DRAWN AEROBIC AND ANAEROBIC Blood Culture adequate volume   Culture   Final    NO GROWTH 5 DAYS Performed  at [facility], [place] [address] [place], [place] [address]    Report Status 03/09/2021 FINAL  Final  MRSA Next Gen by PCR, Nasal     Status: None   Collection Time: 03/04/21  3:17 AM   Specimen: Nasal Mucosa; Nasal Swab  Result Value Ref Range Status   MRSA by PCR Next Gen NOT DETECTED NOT DETECTED Final    Comment: (NOTE) The GeneXpert MRSA Assay (FDA approved for NASAL specimens only), is one component of a comprehensive MRSA colonization surveillance program. It is not intended to diagnose MRSA infection nor to guide or monitor treatment for MRSA infections. Test performance is not FDA approved in patients less than 58 years old. Performed at [facility], [place] [address], [place], [place] [address]   Varicella-zoster by PCR (Blood or Swab)     Status: None   Collection Time: 03/04/21 12:31 PM   Specimen: Nasal Mucosa; Sterile Swab  Result Value Ref Range Status   Varicella-Zoster, PCR Negative Negative Final    Comment: (NOTE) No Varicella Zoster Virus DNA  detected. This test was developed and its performance characteristics determined by [facility].  It has not been cleared or approved by the Food and Drug Administration.  The FDA has determined that such clearance or approval is not necessary. Performed At: [facility] [place], [place] [id number] Rush Farmer MD XI:[id number]      Imaging Studies  No results found. Medications   Scheduled Meds:  allopurinol  100 mg Oral Daily   amLODipine  10 mg Oral Daily   apixaban  5 mg Oral BID   atorvastatin  10 mg Oral Daily   carvedilol  25 mg Oral BID WC   colchicine  0.6 mg Oral Daily   diclofenac Sodium  2 g Topical QID   DULoxetine  60 mg Oral Daily   gabapentin  200 mg Oral QHS   insulin aspart  0-9 Units Subcutaneous TID WC   insulin glargine-yfgn  10 Units Subcutaneous Daily   linezolid  600 mg Oral Q12H   Continuous Infusions:   LOS: 6 days   Time spent: >35 min   Richarda Osmond, DO [facility] 03/10/2021, 3:43 PM   To contact the [facility] Attending or Consulting provider for this patient: Check the care team in [facility] for a) attending/consulting [place] provider listed and b) the [facility] team listed Log into [email or web address] and use [place]'s universal password to access. If you do not have the password, please contact the hospital operator. Locate the [facility] provider you are looking for under [facility] and page to a number that you can be directly reached. If you still have difficulty reaching the provider, please page the [facility] (Director on Call) for the Hospitalists listed on amion for assistance.

## 2021-03-10 NOTE — TOC Progression Note (Signed)
Transition of Care [facility]) - Progression Note    Patient Details  Name: Sabrina Baker MRN: [id number] Date of Birth: [birth year]  Transition of Care [facility]) CM/SW Contact  Tom-Johnson, Renea Ee, RN Phone Number: 03/10/2021, 4:43 PM  Clinical Narrative:    Discharge cancelled due to patient complaining of pain to both knees and unable to ambulate. Requesting another day. CM will continue to follow.   Expected Discharge Plan: Home/Self Care Barriers to Discharge: No Barriers Identified  Expected Discharge Plan and Services Expected Discharge Plan: Home/Self Care   Discharge Planning Services: CM Consult, NA Post Acute Care Choice: NA Living arrangements for the past 2 months: Single Family Home Expected Discharge Date: 03/10/21               DME Arranged: N/A DME Agency: NA       HH Arranged: NA HH Agency: NA         Social Determinants of Health (SDOH) Interventions    Readmission Risk Interventions No flowsheet data found.

## 2021-03-10 NOTE — Progress Notes (Signed)
Physical Therapy Treatment Patient Details Name: Sabrina Baker MRN: [id number] DOB: 1940/07/31 Today's Date: 03/10/2021   History of Present Illness 80 y.o. female with medical history significant for insulin-dependent type 2 diabetes, diabetic polyneuropathy, Charcot foot, CKD 3B, paroxysmal A. fib on Eliquis, hyperlipidemia, hypertension, tophaceous gout, iron deficiency anemia, anemia of chronic disease, chronic anxiety/depression, who presented to [facility] ED from home due to gradually worsening left facial cellulitis.    PT Comments    Pt limited by knee pain at this time, unable to stand despite significant PT physical assistance. Pt requires increased time and physical assistance to complete bed mobility as well. Pt will benefit from continued aggressive mobilization to improve activity tolerance and reduce falls risk. Pt reports little assistance available at home from son and will need to perform mot tasks independently. Based on mobility quality today the patient will benefit from inpatient placement due to high falls risk. PT will attempt to see frequently to progress mobility in an effort to progress to discharge home.   Recommendations for follow up therapy are one component of a multi-disciplinary discharge planning process, led by the attending physician.  Recommendations may be updated based on patient status, additional functional criteria and insurance authorization.  Follow Up Recommendations  SNF;Supervision for mobility/OOB (HHPT if pt refuses placement)     Equipment Recommendations  Rolling walker with 5" wheels    Recommendations for Other Services       Precautions / Restrictions Precautions Precautions: Fall Precaution Comments: watch O2 sats Restrictions Weight Bearing Restrictions: No     Mobility  Bed Mobility Overal bed mobility: Needs Assistance Bed Mobility: Supine to Sit;Sit to Supine     Supine to sit: Min assist;HOB elevated Sit to supine: Mod  assist        Transfers Overall transfer level: Needs assistance Equipment used: Rolling walker (2 wheeled) Transfers: Sit to/from Stand Sit to Stand: Max assist (unable to come to erect standing position.)            Ambulation/Gait                 Stairs             Wheelchair Mobility    Modified Rankin (Stroke Patients Only)       Balance Overall balance assessment: Needs assistance Sitting-balance support: No upper extremity supported;Feet supported Sitting balance-Leahy Scale: Fair     Standing balance support:  (unable to stand)                                Cognition Arousal/Alertness: Awake/alert Behavior During Therapy: WFL for tasks assessed/performed Overall Cognitive Status: Impaired/Different from baseline Area of Impairment: Awareness                           Awareness: Emergent          Exercises      General Comments General comments (skin integrity, edema, etc.): 2L [place] running from wall upon PT arrival however not in pt's nose. Pt sats 85% on RA with activity, difficult to maintain canula in nose due to wound on left face.      Pertinent Vitals/Pain Pain Assessment: Faces Faces Pain Scale: Hurts even more Pain Location: knees Pain Descriptors / Indicators: Grimacing Pain Intervention(s): Monitored during session    Home Living  Prior Function            PT Goals (current goals can now be found in the care plan section) Acute Rehab PT Goals Patient Stated Goal: Figure out this cellulitis and go back home, participate in church volunteer events Progress towards PT goals: Not progressing toward goals - comment (limited by knee pain)    Frequency    Min 3X/week      PT Plan Discharge plan needs to be updated    Co-evaluation              AM-PAC PT "6 Clicks" Mobility   Outcome Measure  Help needed turning from your back to your side while in  a flat bed without using bedrails?: A Little Help needed moving from lying on your back to sitting on the side of a flat bed without using bedrails?: A Little Help needed moving to and from a bed to a chair (including a wheelchair)?: A Lot Help needed standing up from a chair using your arms (e.g., wheelchair or bedside chair)?: Total Help needed to walk in hospital room?: Total Help needed climbing 3-5 steps with a railing? : Total 6 Click Score: 11    End of Session   Activity Tolerance: Patient limited by pain Patient left: in bed;with call bell/phone within reach;with bed alarm set Nurse Communication: Mobility status PT Visit Diagnosis: Unsteadiness on feet (R26.81);Muscle weakness (generalized) (M62.81)     Time: 2683-4196 PT Time Calculation (min) (ACUTE ONLY): 38 min  Charges:  $Therapeutic Activity: 23-37 mins                     Zenaida Niece, PT, DPT Acute Rehabilitation Pager: [phone]    Zenaida Niece 03/10/2021, 3:17 PM

## 2021-03-10 NOTE — TOC Transition Note (Signed)
Transition of Care [facility]) - CM/SW Discharge Note   Patient Details  Name: Sabrina Baker MRN: [id number] Date of Birth: 06/12/1941  Transition of Care [facility]) CM/SW Contact:  Tom-Johnson, Renea Ee, RN Phone Number: 03/10/2021, 1:19 PM   Clinical Narrative:    Patient is medically ready and discharged to home. Denies any needs. Daughter to transport at discharge. No further TOC needs.   Final next level of care: Home/Self Care Barriers to Discharge: No Barriers Identified   Patient Goals and CMS Choice Patient states their goals for this hospitalization and ongoing recovery are:: To go home CMS Medicare.gov Compare Post Acute Care list provided to:: Patient Choice offered to / list presented to : NA  Discharge Placement                       Discharge Plan and Services   Discharge Planning Services: CM Consult, NA Post Acute Care Choice: NA          DME Arranged: N/A DME Agency: NA       HH Arranged: NA HH Agency: NA        Social Determinants of Health (SDOH) Interventions     Readmission Risk Interventions No flowsheet data found.

## 2021-03-11 ENCOUNTER — Ambulatory Visit: Payer: Medicare Other | Admitting: Podiatry

## 2021-03-11 DIAGNOSIS — L03211 Cellulitis of face: Secondary | ICD-10-CM | POA: Diagnosis not present

## 2021-03-11 DIAGNOSIS — M1000 Idiopathic gout, unspecified site: Secondary | ICD-10-CM | POA: Diagnosis not present

## 2021-03-11 LAB — BASIC METABOLIC PANEL
Anion gap: 7 (ref 5–15)
Anion gap: 9 (ref 5–15)
BUN: 20 mg/dL (ref 8–23)
BUN: 24 mg/dL — ABNORMAL HIGH (ref 8–23)
CO2: 27 mmol/L (ref 22–32)
CO2: 25 mmol/L (ref 22–32)
Calcium: 8.6 mg/dL — ABNORMAL LOW (ref 8.9–10.3)
Calcium: 8.3 mg/dL — ABNORMAL LOW (ref 8.9–10.3)
Chloride: 97 mmol/L — ABNORMAL LOW (ref 98–111)
Chloride: 96 mmol/L — ABNORMAL LOW (ref 98–111)
Creatinine, Ser: 1.51 mg/dL — ABNORMAL HIGH (ref 0.44–1.00)
Creatinine, Ser: 1.54 mg/dL — ABNORMAL HIGH (ref 0.44–1.00)
GFR, Estimated: 35 mL/min — ABNORMAL LOW (ref >60)
GFR, Estimated: 34 mL/min — ABNORMAL LOW (ref >60)
Glucose, Bld: 96 mg/dL (ref 70–99)
Glucose, Bld: 216 mg/dL — ABNORMAL HIGH (ref 70–99)
Potassium: 5.2 mmol/L — ABNORMAL HIGH (ref 3.5–5.1)
Potassium: 5.1 mmol/L (ref 3.5–5.1)
Sodium: 131 mmol/L — ABNORMAL LOW (ref 135–145)
Sodium: 130 mmol/L — ABNORMAL LOW (ref 135–145)

## 2021-03-11 LAB — GLUCOSE, CAPILLARY
Glucose-Capillary: 100 mg/dL — ABNORMAL HIGH (ref 70–99)
Glucose-Capillary: 125 mg/dL — ABNORMAL HIGH (ref 70–99)
Glucose-Capillary: 213 mg/dL — ABNORMAL HIGH (ref 70–99)
Glucose-Capillary: 85 mg/dL (ref 70–99)

## 2021-03-11 MED ORDER — SODIUM CHLORIDE 0.9 % IV BOLUS
1000.0000 mL | Freq: Once | INTRAVENOUS | Status: AC
  Administered 2021-03-11: 1000 mL via INTRAVENOUS

## 2021-03-11 MED ORDER — ALBUTEROL SULFATE (2.5 MG/3ML) 0.083% IN NEBU
2.5000 mg | INHALATION_SOLUTION | Freq: Once | RESPIRATORY_TRACT | Status: AC
  Administered 2021-03-11: 2.5 mg via RESPIRATORY_TRACT
  Filled 2021-03-11: qty 3

## 2021-03-11 MED ORDER — COLCHICINE 0.3 MG HALF TABLET
0.3000 mg | ORAL_TABLET | Freq: Every day | ORAL | Status: DC
  Administered 2021-03-12 – 2021-03-23 (×12): 0.3 mg via ORAL
  Filled 2021-03-11 (×12): qty 1

## 2021-03-11 MED ORDER — SODIUM CHLORIDE 0.9 % IV SOLN
INTRAVENOUS | Status: DC
  Administered 2021-03-11: 1000 mL via INTRAVENOUS

## 2021-03-11 MED ORDER — ACETAMINOPHEN 325 MG PO TABS
650.0000 mg | ORAL_TABLET | Freq: Four times a day (QID) | ORAL | Status: DC
  Administered 2021-03-11 – 2021-03-12 (×5): 650 mg via ORAL
  Filled 2021-03-11 (×5): qty 2

## 2021-03-11 NOTE — TOC Progression Note (Signed)
Transition of Care [facility]) - Progression Note    Patient Details  Name: Sabrina Baker MRN: [id number] Date of Birth: August 23, 1940  Transition of Care [facility]) CM/SW Contact  Tom-Johnson, Renea Ee, RN Phone Number: 03/11/2021, 3:19 PM  Clinical Narrative:    PT recommending home health PT. CM spoke with patient at bedside and she declines stating she does not need any therapy. She will do just fine. CM educated patient about the importance of Physical Therapy especially when she is complaining of bilateral knee pains from Gout to help with gait stability. Patient states she is aware but still declines. Daughter will transport tomorrow at discharge. CM will continue to follow with needs.   Expected Discharge Plan: Home/Self Care Barriers to Discharge: No Barriers Identified  Expected Discharge Plan and Services Expected Discharge Plan: Home/Self Care   Discharge Planning Services: CM Consult, NA Post Acute Care Choice: NA Living arrangements for the past 2 months: Single Family Home Expected Discharge Date: 03/10/21               DME Arranged: N/A DME Agency: NA       HH Arranged: NA HH Agency: NA         Social Determinants of Health (SDOH) Interventions    Readmission Risk Interventions No flowsheet data found.

## 2021-03-11 NOTE — Plan of Care (Signed)
  Problem: Education: Goal: Knowledge of General Education information will improve Description Including pain rating scale, medication(s)/side effects and non-pharmacologic comfort measures Outcome: Progressing   Problem: Health Behavior/Discharge Planning: Goal: Ability to manage health-related needs will improve Outcome: Progressing   

## 2021-03-11 NOTE — Progress Notes (Signed)
PROGRESS NOTE  Sabrina Baker    DOB: 10-31-1940, 80 y.o.  QMG:[id number]  PCP: [name], MD   Code Status: Full Code   DOA: 03/03/2021   LOS: 7  Brief Narrative of Current Hospitalization  Sabrina Baker is a 80 y.o. female with a PMH significant for insulin-dependent type 2 diabetes, Charcot foot, CKD stage IIIb, A. 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 They presented from home to the ED on 03/03/2021 with swelling, erythema, purulence from left cheek x 3 days. In the ED, it was found that they had cellulitis as demonstrated on maxillofacial CT. They were treated with IV antibiotics.  ENT, rheumatology and ID were also consulted. Patient was admitted to medicine service for further workup and management of cellulitis as outlined in detail below.  03/11/21 -stable, pain better controlled  Assessment & Plan  Active Problems:   Facial cellulitis   Acute idiopathic gout  Polyarthritis/acute gouty attack on right thumb and knee- remains for pain control but endorses that it is improving.  Patient strongly declines SNF or HHPT. She wishes to stay longer since her daughter cannot drive in bad weather to pick her up. She is agreeable to working with PT and attempting to get up to chair today. -Continue allopurinol, colchicine -Rheumatology OP - continue PRN oxy IR and tylenol  Facial cellulitis/carbuncle- afebrile, improving.  Blood cultures negative. Patient has no complaints on her face. -ID following      -Continue linezolid until 10/2 - ENT following- signed off for dc - WOC s/o - pain control PRN  AKI with Hyponatremia, hyperkalemia- Na decreased to 131 today, K 5.2. Patient is asymptomatic. Cr 1.22>1.51. suspect this is side effect of medications.  - ECG - NS bolus followed by maintenance.  - strict I/O - BMP 1400 today  Insulin dependent Type 2 diabetes- fasting glucose this am 85. Hgb A1c was 8.1 on admission. Takes  trulicity at home as well. - continue sSSI - decreased home Semglee to 10units daily am -Continue atorvastatin  HTN- chronic, stable -Continue amlodipine, carvedilol  A. fib-rate controlled -Continue Eliquis -Continue Coreg  Anxiety/depression- -Continue duloxetine  DVT prophylaxis: SCDs Start: 03/04/21 0244 apixaban (ELIQUIS) tablet 5 mg   Diet:  Diet Orders (From admission, onward)     Start     Ordered   03/10/21 1549  Diet heart healthy/carb modified Room service appropriate? Yes; Fluid consistency: Thin; Fluid restriction: 1200 mL Fluid  Diet effective now       Comments: Patient is Vegetarian.  Question Answer Comment  Diet-HS Snack? Nothing   Room service appropriate? Yes   Fluid consistency: Thin   Fluid restriction: 1200 mL Fluid      03/10/21 1549            Subjective 03/11/21    Pt feels that her knee pain is improving. She doesn't want to go home today because her daughter can't drive on the roads due to weather warnings to stay home.   Disposition Plan & Communication  Status is: Inpatient  Remains inpatient appropriate because:Unsafe d/c plan  Dispo:  Patient From: Home  Planned Disposition: Home 10/1 pending pain control and ride home  Medically stable for discharge: No     Family Communication: none   Consults, Procedures, Significant Events  Consultants:  ID Rheumatology ENT  Procedures/significant events:  none  Antimicrobials:  Anti-infectives (From admission, onward)    Start     Dose/Rate Route Frequency  Ordered Stop   03/08/21 0000  linezolid (ZYVOX) 600 MG tablet       Note to Pharmacy: Stop date of 03/13/21   600 mg Oral 2 times daily 03/08/21 1135 03/13/21 2359   03/04/21 2200  cefTRIAXone (ROCEPHIN) 2 g in sodium chloride 0.9 % 100 mL IVPB  Status:  Discontinued        2 g 200 mL/hr over 30 Minutes Intravenous Every 24 hours 03/04/21 0327 03/04/21 1444   03/04/21 1600  valACYclovir (VALTREX) tablet 1,000 mg  Status:   Discontinued        1,000 mg Oral 3 times daily 03/04/21 1444 03/04/21 1446   03/04/21 1500  valACYclovir (VALTREX) tablet 1,000 mg  Status:  Discontinued        1,000 mg Oral Daily 03/04/21 1446 03/08/21 1049   03/04/21 0300  linezolid (ZYVOX) tablet 600 mg        600 mg Oral Every 12 hours 03/04/21 0245 03/13/21 2359   03/04/21 0230  cefTRIAXone (ROCEPHIN) 2 g in sodium chloride 0.9 % 100 mL IVPB        2 g 200 mL/hr over 30 Minutes Intravenous  Once 03/04/21 0218 03/04/21 0309        Objective   Vitals:   03/10/21 1758 03/10/21 2105 03/11/21 0517 03/11/21 1049  BP: (!) 110/46 111/70  (!) 111/51  Pulse: 71 71  64  Resp: 17 18  18   Temp: 98 F (36.7 C) 99.5 F (37.5 C)  98.1 F (36.7 C)  TempSrc:  Oral  Oral  SpO2: (!) 43% 94%    Weight:   77.8 kg   Height:        Intake/Output Summary (Last 24 hours) at 03/11/2021 1107 Last data filed at 03/11/2021 1051 Gross per 24 hour  Intake 880 ml  Output 800 ml  Net 80 ml    Filed Weights   03/04/21 1856 03/08/21 2044 03/11/21 0517  Weight: 72.4 kg 75.9 kg 77.8 kg    Patient BMI: Body mass index is 30.38 kg/m.   Physical Exam: General: awake, alert, NAD HEENT: atraumatic, clear conjunctiva, anicteric sclera, moist mucus membranes, hearing grossly normal. Lesion on left cheek has purulent drainage without surrounding erythema. Non-tender. Respiratory: CTAB,  normal respiratory effort. Cardiovascular: quick capillary refill  Nervous: A&O x2. no gross focal neurologic deficits Extremities: moves all equally, no edema, normal tone. Tenderness to right knee with mild to no swelling. R thumb normal temp, tender to ROM, no erythema, generalized swelling to thumb. Able to move legs in bed without pain Skin: dry, intact, normal temperature, normal color, No rashes, lesions or ulcers Psychiatry: pressured speech. Mildly anxious  Labs   I have personally reviewed following labs and imaging studies Admission on 03/03/2021  Component  Date Value Ref Range Status   WBC 03/03/2021 22.6 (A) 4.0 - 10.5 K/uL Final   RBC 03/03/2021 3.94  3.87 - 5.11 MIL/uL Final   Hemoglobin 03/03/2021 12.5  12.0 - 15.0 g/dL Final   HCT 03/03/2021 38.5  36.0 - 46.0 % Final   MCV 03/03/2021 97.7  80.0 - 100.0 fL Final   MCH 03/03/2021 31.7  26.0 - 34.0 pg Final   MCHC 03/03/2021 32.5  30.0 - 36.0 g/dL Final   RDW 03/03/2021 14.6  11.5 - 15.5 % Final   Platelets 03/03/2021 281  150 - 400 K/uL Final   nRBC 03/03/2021 0.0  0.0 - 0.2 % Final   Neutrophils Relative % 03/03/2021 84  %  Final   Neutro Abs 03/03/2021 19.0 (A) 1.7 - 7.7 K/uL Final   Lymphocytes Relative 03/03/2021 7  % Final   Lymphs Abs 03/03/2021 1.6  0.7 - 4.0 K/uL Final   Monocytes Relative 03/03/2021 8  % Final   Monocytes Absolute 03/03/2021 1.7 (A) 0.1 - 1.0 K/uL Final   Eosinophils Relative 03/03/2021 1  % Final   Eosinophils Absolute 03/03/2021 0.1  0.0 - 0.5 K/uL Final   Basophils Relative 03/03/2021 0  % Final   Basophils Absolute 03/03/2021 0.1  0.0 - 0.1 K/uL Final   Immature Granulocytes 03/03/2021 0  % Final   Abs Immature Granulocytes 03/03/2021 0.10 (A) 0.00 - 0.07 K/uL Final   Sodium 03/03/2021 134 (A) 135 - 145 mmol/L Final   Potassium 03/03/2021 4.3  3.5 - 5.1 mmol/L Final   Chloride 03/03/2021 100  98 - 111 mmol/L Final   CO2 03/03/2021 27  22 - 32 mmol/L Final   Glucose, Bld 03/03/2021 184 (A) 70 - 99 mg/dL Final   BUN 03/03/2021 21  8 - 23 mg/dL Final   Creatinine, Ser 03/03/2021 1.68 (A) 0.44 - 1.00 mg/dL Final   Calcium 03/03/2021 8.7 (A) 8.9 - 10.3 mg/dL Final   Total Protein 03/03/2021 6.2 (A) 6.5 - 8.1 g/dL Final   Albumin 03/03/2021 2.9 (A) 3.5 - 5.0 g/dL Final   AST 03/03/2021 16  15 - 41 U/L Final   ALT 03/03/2021 10  0 - 44 U/L Final   Alkaline Phosphatase 03/03/2021 72  38 - 126 U/L Final   Total Bilirubin 03/03/2021 0.8  0.3 - 1.2 mg/dL Final   GFR, Estimated 03/03/2021 31 (A) >60 mL/min Final   Anion gap 03/03/2021 7  5 - 15 Final    Specimen Description 03/04/2021 BLOOD SITE NOT SPECIFIED   Final   Special Requests 03/04/2021 AEROBIC BOTTLE ONLY Blood Culture adequate volume   Final   Culture 03/04/2021    Final                   Value:NO GROWTH 5 DAYS Performed at [facility], [place] [address], [place], [place] [address]    Report Status 03/04/2021 03/09/2021 FINAL   Final   Specimen Description 03/04/2021 BLOOD LEFT ANTECUBITAL   Final   Special Requests 03/04/2021 BOTTLES DRAWN AEROBIC AND ANAEROBIC Blood Culture adequate volume   Final   Culture 03/04/2021    Final                   Value:NO GROWTH 5 DAYS Performed at [facility], [place] [address], [place], [place] [address]    Report Status 03/04/2021 03/09/2021 FINAL   Final   SARS Coronavirus 2 by RT PCR 03/04/2021 NEGATIVE  NEGATIVE Final   Influenza A by PCR 03/04/2021 NEGATIVE  NEGATIVE Final   Influenza B by PCR 03/04/2021 NEGATIVE  NEGATIVE Final   MRSA by PCR Next Gen 03/04/2021 NOT DETECTED  NOT DETECTED Final   Hgb A1c MFr Bld 03/04/2021 8.1 (A) 4.8 - 5.6 % Final   Mean Plasma Glucose 03/04/2021 185.77  mg/dL Final   WBC 03/04/2021 21.9 (A) 4.0 - 10.5 K/uL Final   RBC 03/04/2021 3.84 (A) 3.87 - 5.11 MIL/uL Final   Hemoglobin 03/04/2021 12.3  12.0 - 15.0 g/dL Final   HCT 03/04/2021 38.0  36.0 - 46.0 % Final   MCV 03/04/2021 99.0  80.0 - 100.0 fL Final   MCH 03/04/2021 32.0  26.0 - 34.0  pg Final   MCHC 03/04/2021 32.4  30.0 - 36.0 g/dL Final   RDW 03/04/2021 14.6  11.5 - 15.5 % Final   Platelets 03/04/2021 259  150 - 400 K/uL Final   nRBC 03/04/2021 0.0  0.0 - 0.2 % Final   Sodium 03/04/2021 133 (A) 135 - 145 mmol/L Final   Potassium 03/04/2021 4.5  3.5 - 5.1 mmol/L Final   Chloride 03/04/2021 98  98 - 111 mmol/L Final   CO2 03/04/2021 25  22 - 32 mmol/L Final   Glucose, Bld 03/04/2021 255 (A) 70 - 99 mg/dL Final   BUN 03/04/2021 23  8 - 23 mg/dL Final   Creatinine, Ser 03/04/2021 1.70 (A) 0.44 - 1.00 mg/dL Final   Calcium 03/04/2021  8.6 (A) 8.9 - 10.3 mg/dL Final   GFR, Estimated 03/04/2021 30 (A) >60 mL/min Final   Anion gap 03/04/2021 10  5 - 15 Final   Magnesium 03/04/2021 1.8  1.7 - 2.4 mg/dL Final   Phosphorus 03/04/2021 3.2  2.5 - 4.6 mg/dL Final   Glucose-Capillary 03/04/2021 199 (A) 70 - 99 mg/dL Final   Source of Sample 03/04/2021 PENDING   Incomplete   HSV 1 DNA 03/04/2021 Negative  Negative Final   HSV 2 DNA 03/04/2021 Negative  Negative Final   Varicella-Zoster, PCR 03/04/2021 Negative  Negative Final   Glucose-Capillary 03/04/2021 278 (A) 70 - 99 mg/dL Final   WBC 03/05/2021 14.2 (A) 4.0 - 10.5 K/uL Final   RBC 03/05/2021 3.27 (A) 3.87 - 5.11 MIL/uL Final   Hemoglobin 03/05/2021 10.1 (A) 12.0 - 15.0 g/dL Final   HCT 03/05/2021 32.2 (A) 36.0 - 46.0 % Final   MCV 03/05/2021 98.5  80.0 - 100.0 fL Final   MCH 03/05/2021 30.9  26.0 - 34.0 pg Final   MCHC 03/05/2021 31.4  30.0 - 36.0 g/dL Final   RDW 03/05/2021 14.3  11.5 - 15.5 % Final   Platelets 03/05/2021 244  150 - 400 K/uL Final   nRBC 03/05/2021 0.0  0.0 - 0.2 % Final   Glucose-Capillary 03/05/2021 115 (A) 70 - 99 mg/dL Final   Glucose-Capillary 03/05/2021 335 (A) 70 - 99 mg/dL Final   Glucose-Capillary 03/05/2021 251 (A) 70 - 99 mg/dL Final   WBC 03/06/2021 13.9 (A) 4.0 - 10.5 K/uL Final   RBC 03/06/2021 3.55 (A) 3.87 - 5.11 MIL/uL Final   Hemoglobin 03/06/2021 11.1 (A) 12.0 - 15.0 g/dL Final   HCT 03/06/2021 34.7 (A) 36.0 - 46.0 % Final   MCV 03/06/2021 97.7  80.0 - 100.0 fL Final   MCH 03/06/2021 31.3  26.0 - 34.0 pg Final   MCHC 03/06/2021 32.0  30.0 - 36.0 g/dL Final   RDW 03/06/2021 14.5  11.5 - 15.5 % Final   Platelets 03/06/2021 290  150 - 400 K/uL Final   nRBC 03/06/2021 0.0  0.0 - 0.2 % Final   Neutrophils Relative % 03/06/2021 79  % Final   Neutro Abs 03/06/2021 11.0 (A) 1.7 - 7.7 K/uL Final   Lymphocytes Relative 03/06/2021 8  % Final   Lymphs Abs 03/06/2021 1.1  0.7 - 4.0 K/uL Final   Monocytes Relative 03/06/2021 9  % Final    Monocytes Absolute 03/06/2021 1.2 (A) 0.1 - 1.0 K/uL Final   Eosinophils Relative 03/06/2021 3  % Final   Eosinophils Absolute 03/06/2021 0.4  0.0 - 0.5 K/uL Final   Basophils Relative 03/06/2021 0  % Final   Basophils Absolute 03/06/2021 0.0  0.0 - 0.1 K/uL  Final   Immature Granulocytes 03/06/2021 1  % Final   Abs Immature Granulocytes 03/06/2021 0.15 (A) 0.00 - 0.07 K/uL Final   Sodium 03/06/2021 128 (A) 135 - 145 mmol/L Final   Potassium 03/06/2021 4.8  3.5 - 5.1 mmol/L Final   Chloride 03/06/2021 98  98 - 111 mmol/L Final   CO2 03/06/2021 23  22 - 32 mmol/L Final   Glucose, Bld 03/06/2021 177 (A) 70 - 99 mg/dL Final   BUN 03/06/2021 23  8 - 23 mg/dL Final   Creatinine, Ser 03/06/2021 1.72 (A) 0.44 - 1.00 mg/dL Final   Calcium 03/06/2021 8.6 (A) 8.9 - 10.3 mg/dL Final   GFR, Estimated 03/06/2021 30 (A) >60 mL/min Final   Anion gap 03/06/2021 7  5 - 15 Final   Magnesium 03/06/2021 1.7  1.7 - 2.4 mg/dL Final   Glucose-Capillary 03/05/2021 220 (A) 70 - 99 mg/dL Final   Glucose-Capillary 03/06/2021 172 (A) 70 - 99 mg/dL Final   Glucose-Capillary 03/06/2021 239 (A) 70 - 99 mg/dL Final   Glucose-Capillary 03/06/2021 113 (A) 70 - 99 mg/dL Final   Glucose-Capillary 03/06/2021 134 (A) 70 - 99 mg/dL Final   Glucose-Capillary 03/07/2021 113 (A) 70 - 99 mg/dL Final   Glucose-Capillary 03/04/2021 300 (A) 70 - 99 mg/dL Final   Comment 1 03/04/2021 Notify RN   Final   Glucose-Capillary 03/07/2021 190 (A) 70 - 99 mg/dL Final   Glucose-Capillary 03/07/2021 276 (A) 70 - 99 mg/dL Final   Sodium 03/08/2021 134 (A) 135 - 145 mmol/L Final   Potassium 03/08/2021 5.0  3.5 - 5.1 mmol/L Final   Chloride 03/08/2021 101  98 - 111 mmol/L Final   CO2 03/08/2021 27  22 - 32 mmol/L Final   Glucose, Bld 03/08/2021 168 (A) 70 - 99 mg/dL Final   BUN 03/08/2021 17  8 - 23 mg/dL Final   Creatinine, Ser 03/08/2021 1.28 (A) 0.44 - 1.00 mg/dL Final   Calcium 03/08/2021 8.6 (A) 8.9 - 10.3 mg/dL Final   GFR, Estimated  03/08/2021 43 (A) >60 mL/min Final   Anion gap 03/08/2021 6  5 - 15 Final   Glucose-Capillary 03/08/2021 91  70 - 99 mg/dL Final   Comment 1 03/08/2021 QC Due   Final   Glucose-Capillary 03/08/2021 154 (A) 70 - 99 mg/dL Final   Glucose-Capillary 03/08/2021 172 (A) 70 - 99 mg/dL Final   Glucose-Capillary 03/08/2021 210 (A) 70 - 99 mg/dL Final   Glucose-Capillary 03/09/2021 81  70 - 99 mg/dL Final   Glucose-Capillary 03/09/2021 225 (A) 70 - 99 mg/dL Final   Glucose-Capillary 03/09/2021 196 (A) 70 - 99 mg/dL Final   Sodium 03/10/2021 132 (A) 135 - 145 mmol/L Final   Potassium 03/10/2021 5.0  3.5 - 5.1 mmol/L Final   Chloride 03/10/2021 98  98 - 111 mmol/L Final   CO2 03/10/2021 28  22 - 32 mmol/L Final   Glucose, Bld 03/10/2021 124 (A) 70 - 99 mg/dL Final   BUN 03/10/2021 13  8 - 23 mg/dL Final   Creatinine, Ser 03/10/2021 1.22 (A) 0.44 - 1.00 mg/dL Final   Calcium 03/10/2021 8.8 (A) 8.9 - 10.3 mg/dL Final   GFR, Estimated 03/10/2021 45 (A) >60 mL/min Final   Anion gap 03/10/2021 6  5 - 15 Final   Glucose-Capillary 03/09/2021 193 (A) 70 - 99 mg/dL Final   Comment 1 03/09/2021 Notify RN   Final   Comment 2 03/09/2021 Document in Chart   Final   Glucose-Capillary 03/10/2021  119 (A) 70 - 99 mg/dL Final   Glucose-Capillary 03/10/2021 176 (A) 70 - 99 mg/dL Final   Glucose-Capillary 03/10/2021 138 (A) 70 - 99 mg/dL Final   Sodium 03/11/2021 131 (A) 135 - 145 mmol/L Final   Potassium 03/11/2021 5.2 (A) 3.5 - 5.1 mmol/L Final   Chloride 03/11/2021 97 (A) 98 - 111 mmol/L Final   CO2 03/11/2021 27  22 - 32 mmol/L Final   Glucose, Bld 03/11/2021 96  70 - 99 mg/dL Final   BUN 03/11/2021 20  8 - 23 mg/dL Final   Creatinine, Ser 03/11/2021 1.51 (A) 0.44 - 1.00 mg/dL Final   Calcium 03/11/2021 8.6 (A) 8.9 - 10.3 mg/dL Final   GFR, Estimated 03/11/2021 35 (A) >60 mL/min Final   Anion gap 03/11/2021 7  5 - 15 Final   Glucose-Capillary 03/10/2021 162 (A) 70 - 99 mg/dL Final   Glucose-Capillary  03/11/2021 85  70 - 99 mg/dL Final   Glucose-Capillary 03/11/2021 125 (A) 70 - 99 mg/dL Final     Recent Results (from the past 240 hour(s))  Resp Panel by RT-PCR (Flu A&B, Covid) Nasopharyngeal Swab     Status: None   Collection Time: 03/04/21 12:11 AM   Specimen: Nasopharyngeal Swab; Nasopharyngeal(NP) swabs in vial transport medium  Result Value Ref Range Status   SARS Coronavirus 2 by RT PCR NEGATIVE NEGATIVE Final    Comment: (NOTE) SARS-CoV-2 target nucleic acids are NOT DETECTED.  The SARS-CoV-2 RNA is generally detectable in upper respiratory specimens during the acute phase of infection. The lowest concentration of SARS-CoV-2 viral copies this assay can detect is 138 copies/mL. A negative result does not preclude SARS-Cov-2 infection and should not be used as the sole basis for treatment or other patient management decisions. A negative result may occur with  improper specimen collection/handling, submission of specimen other than nasopharyngeal swab, presence of viral mutation(s) within the areas targeted by this assay, and inadequate number of viral copies(<138 copies/mL). A negative result must be combined with clinical observations, patient history, and epidemiological information. The expected result is Negative.  Fact Sheet for Patients:  EntrepreneurPulse.com.au  Fact Sheet for Healthcare Providers:  IncredibleEmployment.be  This test is no t yet approved or cleared by the [place] FDA and  has been authorized for detection and/or diagnosis of SARS-CoV-2 by FDA under an Emergency Use Authorization (EUA). This EUA will remain  in effect (meaning this test can be used) for the duration of the COVID-19 declaration under Section 564(b)(1) of the Act, 21 U.S.C.section 360bbb-3(b)(1), unless the authorization is terminated  or revoked sooner.       Influenza A by PCR NEGATIVE NEGATIVE Final   Influenza B by PCR NEGATIVE  NEGATIVE Final    Comment: (NOTE) The Xpert Xpress SARS-CoV-2/FLU/RSV plus assay is intended as an aid in the diagnosis of influenza from Nasopharyngeal swab specimens and should not be used as a sole basis for treatment. Nasal washings and aspirates are unacceptable for Xpert Xpress SARS-CoV-2/FLU/RSV testing.  Fact Sheet for Patients: EntrepreneurPulse.com.au  Fact Sheet for Healthcare Providers: IncredibleEmployment.be  This test is not yet approved or cleared by the [place] FDA and has been authorized for detection and/or diagnosis of SARS-CoV-2 by FDA under an Emergency Use Authorization (EUA). This EUA will remain in effect (meaning this test can be used) for the duration of the COVID-19 declaration under Section 564(b)(1) of the Act, 21 U.S.C. section 360bbb-3(b)(1), unless the authorization is terminated or revoked.  Performed at [facility]  [facility], [place] [address] [place], [place] [address]   Blood culture (routine x 2)     Status: None   Collection Time: 03/04/21  1:00 AM   Specimen: BLOOD  Result Value Ref Range Status   Specimen Description BLOOD SITE NOT SPECIFIED  Final   Special Requests AEROBIC BOTTLE ONLY Blood Culture adequate volume  Final   Culture   Final    NO GROWTH 5 DAYS Performed at [place] [address] [place], [place] [address]    Report Status 03/09/2021 FINAL  Final  Blood culture (routine x 2)     Status: None   Collection Time: 03/04/21  1:10 AM   Specimen: BLOOD  Result Value Ref Range Status   Specimen Description BLOOD LEFT ANTECUBITAL  Final   Special Requests   Final    BOTTLES DRAWN AEROBIC AND ANAEROBIC Blood Culture adequate volume   Culture   Final    NO GROWTH 5 DAYS Performed at [facility], [place] [address] [place], [place] [address]    Report Status 03/09/2021 FINAL  Final  MRSA Next Gen by PCR, Nasal     Status: None   Collection Time: 03/04/21  3:17 AM    Specimen: Nasal Mucosa; Nasal Swab  Result Value Ref Range Status   MRSA by PCR Next Gen NOT DETECTED NOT DETECTED Final    Comment: (NOTE) The GeneXpert MRSA Assay (FDA approved for NASAL specimens only), is one component of a comprehensive MRSA colonization surveillance program. It is not intended to diagnose MRSA infection nor to guide or monitor treatment for MRSA infections. Test performance is not FDA approved in patients less than 49 years old. Performed at [facility], [place] [address] [place], [place] [address]   Varicella-zoster by PCR (Blood or Swab)     Status: None   Collection Time: 03/04/21 12:31 PM   Specimen: Nasal Mucosa; Sterile Swab  Result Value Ref Range Status   Varicella-Zoster, PCR Negative Negative Final    Comment: (NOTE) No Varicella Zoster Virus DNA detected. This test was developed and its performance characteristics determined by [facility].  It has not been cleared or approved by the Food and Drug Administration.  The FDA has determined that such clearance or approval is not necessary. Performed At: [facility] [place], [place] [id number] Rush Farmer MD TW:[id number]      Imaging Studies  No results found. Medications   Scheduled Meds:  acetaminophen  650 mg Oral Q6H   allopurinol  100 mg Oral Daily   amLODipine  10 mg Oral Daily   apixaban  5 mg Oral BID   atorvastatin  10 mg Oral Daily   carvedilol  25 mg Oral BID WC   colchicine  0.6 mg Oral Daily   diclofenac Sodium  2 g Topical QID   DULoxetine  60 mg Oral Daily   gabapentin  200 mg Oral QHS   insulin aspart  0-9 Units Subcutaneous TID WC   insulin glargine-yfgn  10 Units Subcutaneous Daily   linezolid  600 mg Oral Q12H   Continuous Infusions:  sodium chloride 1,000 mL (03/11/21 0844)     LOS: 7 days   Time spent: >35 min   Richarda Osmond, DO [facility] 03/11/2021, 11:07 AM   To contact the [facility] Attending or Consulting provider  for this patient: Check the care team in [facility] for a) attending/consulting [place] provider listed and b) the [place]  team listed Log into [email or web address] and use [place] universal password to access. If you do not have the password, please contact the hospital operator. Locate the [facility] provider you are looking for under [facility] and page to a number that you can be directly reached. If you still have difficulty reaching the provider, please page the [facility] (Director on Call) for the Hospitalists listed on amion for assistance.

## 2021-03-11 NOTE — Progress Notes (Signed)
Physical Therapy Treatment Patient Details Name: Sabrina Baker MRN: [id number] DOB: 05-07-1941 Today's Date: 03/11/2021   History of Present Illness 80 y.o. female with medical history significant for insulin-dependent type 2 diabetes, diabetic polyneuropathy, Charcot foot, CKD 3B, paroxysmal A. fib on Eliquis, hyperlipidemia, hypertension, tophaceous gout, iron deficiency anemia, anemia of chronic disease, chronic anxiety/depression, who presented to [facility] ED from home due to gradually worsening left facial cellulitis.    PT Comments    Patient received in bed, she reports her knees are feeling some better and is agreeable to PT session. Patient is mod independent with bed mobility, required assistance to don shoes prior to standing. She needed min assist to stand from slightly elevated bed. Ambulated 25 feet with RW and min guard. O2 saturations continue to be low at rest and with mobility. She may need home O2 at discharge, she is asymptomatic. She will continue to benefit from skilled PT while here to improve strength, independence and safety with mobility.       Recommendations for follow up therapy are one component of a multi-disciplinary discharge planning process, led by the attending physician.  Recommendations may be updated based on patient status, additional functional criteria and insurance authorization.  Follow Up Recommendations  Home health PT;Supervision - Intermittent     Equipment Recommendations  Other (comment) (patient reports she has rollator and walker)    Recommendations for Other Services       Precautions / Restrictions Precautions Precautions: Fall Precaution Comments: watch O2 sats Restrictions Weight Bearing Restrictions: No     Mobility  Bed Mobility Overal bed mobility: Modified Independent Bed Mobility: Supine to Sit     Supine to sit: Modified independent (Device/Increase time);HOB elevated          Transfers Overall transfer level:  Needs assistance Equipment used: Rolling walker (2 wheeled) Transfers: Sit to/from Stand Sit to Stand: From elevated surface;Min guard         General transfer comment: patient with improve ability and decreased pain this visit.  Ambulation/Gait Ambulation/Gait assistance: Min guard Gait Distance (Feet): 25 Feet Assistive device: Rolling walker (2 wheeled) Gait Pattern/deviations: Step-through pattern;Decreased step length - right;Decreased step length - left Gait velocity: decreased   General Gait Details: Patient continues to have B knee pain, but reports improved a good deal from yesterday. Able to ambulate short distance with min guard and RW.   Stairs             Wheelchair Mobility    Modified Rankin (Stroke Patients Only)       Balance Overall balance assessment: Needs assistance Sitting-balance support: Feet supported Sitting balance-Leahy Scale: Good     Standing balance support: Bilateral upper extremity supported;During functional activity Standing balance-Leahy Scale: Fair Standing balance comment: B UE support due to B knee pain. Min guard for safety                            Cognition Arousal/Alertness: Awake/alert Behavior During Therapy: WFL for tasks assessed/performed Overall Cognitive Status: Within Functional Limits for tasks assessed                                        Exercises      General Comments        Pertinent Vitals/Pain Pain Assessment: Faces Faces Pain Scale: Hurts little more Pain Location:  knees, L >R but improved from yesterday Pain Descriptors / Indicators: Discomfort;Sore Pain Intervention(s): Limited activity within patient's tolerance;Monitored during session    Home Living                      Prior Function            PT Goals (current goals can now be found in the care plan section) Acute Rehab PT Goals Patient Stated Goal: to get back to school/church events PT  Goal Formulation: With patient Time For Goal Achievement: 03/19/21 Potential to Achieve Goals: Good Progress towards PT goals: Progressing toward goals    Frequency    Min 3X/week      PT Plan Discharge plan needs to be updated    Co-evaluation              AM-PAC PT "6 Clicks" Mobility   Outcome Measure  Help needed turning from your back to your side while in a flat bed without using bedrails?: None Help needed moving from lying on your back to sitting on the side of a flat bed without using bedrails?: A Little Help needed moving to and from a bed to a chair (including a wheelchair)?: A Little Help needed standing up from a chair using your arms (e.g., wheelchair or bedside chair)?: A Little Help needed to walk in hospital room?: A Little Help needed climbing 3-5 steps with a railing? : A Lot 6 Click Score: 18    End of Session Equipment Utilized During Treatment: Gait belt Activity Tolerance: Patient limited by pain;Patient limited by fatigue Patient left: in chair;with call bell/phone within reach;with chair alarm set Nurse Communication: Mobility status PT Visit Diagnosis: Other abnormalities of gait and mobility (R26.89);Pain;Difficulty in walking, not elsewhere classified (R26.2) Pain - Right/Left:  (Bilateral) Pain - part of body: Knee     Time: 8882-8003 PT Time Calculation (min) (ACUTE ONLY): 31 min  Charges:  $Gait Training: 23-37 mins                     Pernie Grosso, PT, GCS 03/11/21,1:56 PM

## 2021-03-12 DIAGNOSIS — L03211 Cellulitis of face: Secondary | ICD-10-CM | POA: Diagnosis not present

## 2021-03-12 DIAGNOSIS — M1000 Idiopathic gout, unspecified site: Secondary | ICD-10-CM | POA: Diagnosis not present

## 2021-03-12 LAB — CBC
HCT: 31.7 % — ABNORMAL LOW (ref 36.0–46.0)
Hemoglobin: 10 g/dL — ABNORMAL LOW (ref 12.0–15.0)
MCH: 31.1 pg (ref 26.0–34.0)
MCHC: 31.5 g/dL (ref 30.0–36.0)
MCV: 98.4 fL (ref 80.0–100.0)
Platelets: 304 10*3/uL (ref 150–400)
RBC: 3.22 MIL/uL — ABNORMAL LOW (ref 3.87–5.11)
RDW: 14.2 % (ref 11.5–15.5)
WBC: 10.8 10*3/uL — ABNORMAL HIGH (ref 4.0–10.5)
nRBC: 0 % (ref 0.0–0.2)

## 2021-03-12 LAB — GLUCOSE, CAPILLARY
Glucose-Capillary: 148 mg/dL — ABNORMAL HIGH (ref 70–99)
Glucose-Capillary: 157 mg/dL — ABNORMAL HIGH (ref 70–99)
Glucose-Capillary: 179 mg/dL — ABNORMAL HIGH (ref 70–99)
Glucose-Capillary: 113 mg/dL — ABNORMAL HIGH (ref 70–99)

## 2021-03-12 LAB — BASIC METABOLIC PANEL
Anion gap: 7 (ref 5–15)
BUN: 24 mg/dL — ABNORMAL HIGH (ref 8–23)
CO2: 25 mmol/L (ref 22–32)
Calcium: 8.2 mg/dL — ABNORMAL LOW (ref 8.9–10.3)
Chloride: 99 mmol/L (ref 98–111)
Creatinine, Ser: 1.52 mg/dL — ABNORMAL HIGH (ref 0.44–1.00)
GFR, Estimated: 35 mL/min — ABNORMAL LOW (ref >60)
Glucose, Bld: 151 mg/dL — ABNORMAL HIGH (ref 70–99)
Potassium: 5 mmol/L (ref 3.5–5.1)
Sodium: 131 mmol/L — ABNORMAL LOW (ref 135–145)

## 2021-03-12 MED ORDER — ACETAMINOPHEN 325 MG PO TABS: 650.0000 mg | ORAL_TABLET | Freq: Four times a day (QID) | ORAL | Status: DC

## 2021-03-12 MED ORDER — ACETAMINOPHEN 325 MG PO TABS
650.0000 mg | ORAL_TABLET | Freq: Four times a day (QID) | ORAL | Status: DC
  Administered 2021-03-12 – 2021-03-17 (×19): 650 mg via ORAL
  Filled 2021-03-12 (×18): qty 2

## 2021-03-12 NOTE — Progress Notes (Signed)
PROGRESS NOTE  Sabrina Baker    DOB: [birth year], 80 y.o.  VZC:[id number]  PCP: [name], MD   Code Status: Full Code   DOA: 03/03/2021   LOS: 8  Brief Narrative of Current Hospitalization  Sabrina Baker is a 80 y.o. female with a PMH significant for insulin-dependent type 2 diabetes, Charcot foot, CKD stage IIIb, A. 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 They presented from home to the ED on 03/03/2021 with swelling, erythema, purulence from left cheek x 3 days. In the ED, it was found that they had cellulitis as demonstrated on maxillofacial CT. They were treated with IV antibiotics.  ENT, rheumatology and ID were also consulted. Patient was admitted to medicine service for further workup and management of cellulitis as outlined in detail below.  03/12/21 -stable, pain better controlled  Assessment & Plan  Active Problems:   Facial cellulitis   Acute idiopathic gout  Polyarthritis/acute gouty attack on right thumb and knee- remains for pain control but endorses that it is improving.  Patient strongly declines SNF or HHPT. She is wanting to go home today. -Continue allopurinol, colchicine -Rheumatology OP - continue PRN oxy IR and tylenol  Facial cellulitis/carbuncle- afebrile, improving.  Blood cultures negative. Patient has no complaints on her face. -ID signed off      -Continue linezolid until 10/2 - ENT signed off - WOC s/o - pain control PRN  AKI with Hyponatremia, hyperkalemia- improved to 131 today. K+ normal. Cr stable at 1.52  - continue IV maintenance.  - strict I/O - BMP am  Insulin dependent Type 2 diabetes- fasting glucose this am 148. Hgb A1c was 8.1 on admission. Takes trulicity at home as well. - continue sSSI - decreased home Semglee to 10units daily am -Continue atorvastatin  HTN- chronic, stable -Continue amlodipine, carvedilol  A. fib-rate controlled -Continue Eliquis -Continue  Coreg  Anxiety/depression- -Continue duloxetine  DVT prophylaxis: SCDs Start: 03/04/21 0244 apixaban (ELIQUIS) tablet 5 mg   Diet:  Diet Orders (From admission, onward)     Start     Ordered   03/10/21 1549  Diet heart healthy/carb modified Room service appropriate? Yes; Fluid consistency: Thin; Fluid restriction: 1200 mL Fluid  Diet effective now       Comments: Patient is Vegetarian.  Question Answer Comment  Diet-HS Snack? Nothing   Room service appropriate? Yes   Fluid consistency: Thin   Fluid restriction: 1200 mL Fluid      03/10/21 1549            Subjective 03/12/21    Pt feels pretty good today. She is ready to go home. Continues to complain of chronic knee pain.   Disposition Plan & Communication  Status is: Inpatient  Remains inpatient appropriate because:Unsafe d/c plan  Dispo:  Patient From: Home  Planned Disposition: Home 10/2 pending AKI improvement  Medically stable for discharge: No     Family Communication: none   Consults, Procedures, Significant Events  Consultants:  ID Rheumatology ENT  Procedures/significant events:  none  Antimicrobials:  Anti-infectives (From admission, onward)    Start     Dose/Rate Route Frequency Ordered Stop   03/08/21 0000  linezolid (ZYVOX) 600 MG tablet       Note to Pharmacy: Stop date of 03/13/21   600 mg Oral 2 times daily 03/08/21 1135 03/13/21 2359   03/04/21 2200  cefTRIAXone (ROCEPHIN) 2 g in sodium chloride 0.9 % 100 mL IVPB  Status:  Discontinued        2 g 200 mL/hr over 30 Minutes Intravenous Every 24 hours 03/04/21 0327 03/04/21 1444   03/04/21 1600  valACYclovir (VALTREX) tablet 1,000 mg  Status:  Discontinued        1,000 mg Oral 3 times daily 03/04/21 1444 03/04/21 1446   03/04/21 1500  valACYclovir (VALTREX) tablet 1,000 mg  Status:  Discontinued        1,000 mg Oral Daily 03/04/21 1446 03/08/21 1049   03/04/21 0300  linezolid (ZYVOX) tablet 600 mg        600 mg Oral Every 12 hours  03/04/21 0245 03/13/21 2359   03/04/21 0230  cefTRIAXone (ROCEPHIN) 2 g in sodium chloride 0.9 % 100 mL IVPB        2 g 200 mL/hr over 30 Minutes Intravenous  Once 03/04/21 0218 03/04/21 0309        Objective   Vitals:   03/11/21 1743 03/11/21 2055 03/12/21 0404 03/12/21 1157  BP: 112/64 (!) 116/52 (!) 142/62 (!) 136/55  Pulse: 71 64 75 73  Resp: 17 16 18 18   Temp: 98.1 F (36.7 C) 97.9 F (36.6 C) 97.9 F (36.6 C) 98.5 F (36.9 C)  TempSrc: Oral Oral Oral Oral  SpO2: 94% 95% 94% 97%  Weight:      Height:        Intake/Output Summary (Last 24 hours) at 03/12/2021 1315 Last data filed at 03/12/2021 0700 Gross per 24 hour  Intake 1844.59 ml  Output 350 ml  Net 1494.59 ml    Filed Weights   03/04/21 1856 03/08/21 2044 03/11/21 0517  Weight: 72.4 kg 75.9 kg 77.8 kg    Patient BMI: Body mass index is 30.38 kg/m.   Physical Exam: General: awake, alert, NAD HEENT: atraumatic, clear conjunctiva, anicteric sclera, moist mucus membranes, hearing grossly normal. Lesion on left cheek has purulent drainage without surrounding erythema. Non-tender. Respiratory: CTAB,  normal respiratory effort. Cardiovascular: quick capillary refill  Nervous: A&O x2. no gross focal neurologic deficits Extremities: moves all equally, no edema, normal tone. No Tenderness to knees with mild to no swelling. R thumb normal temp, tender to ROM, no erythema, generalized swelling to thumb. Able to move legs in bed without pain Skin: dry, intact, normal temperature, normal color, No rashes, lesions or ulcers Psychiatry: pressured speech. Mildly anxious  Labs   I have personally reviewed following labs and imaging studies No results displayed because visit has over 200 results.       Recent Results (from the past 240 hour(s))  Resp Panel by RT-PCR (Flu A&B, Covid) Nasopharyngeal Swab     Status: None   Collection Time: 03/04/21 12:11 AM   Specimen: Nasopharyngeal Swab; Nasopharyngeal(NP) swabs in  vial transport medium  Result Value Ref Range Status   SARS Coronavirus 2 by RT PCR NEGATIVE NEGATIVE Final    Comment: (NOTE) SARS-CoV-2 target nucleic acids are NOT DETECTED.  The SARS-CoV-2 RNA is generally detectable in upper respiratory specimens during the acute phase of infection. The lowest concentration of SARS-CoV-2 viral copies this assay can detect is 138 copies/mL. A negative result does not preclude SARS-Cov-2 infection and should not be used as the sole basis for treatment or other patient management decisions. A negative result may occur with  improper specimen collection/handling, submission of specimen other than nasopharyngeal swab, presence of viral mutation(s) within the areas targeted by this assay, and inadequate number of viral copies(<138 copies/mL). A negative result must be combined with clinical  observations, patient history, and epidemiological information. The expected result is Negative.  Fact Sheet for Patients:  EntrepreneurPulse.com.au  Fact Sheet for Healthcare Providers:  IncredibleEmployment.be  This test is no t yet approved or cleared by the [place] FDA and  has been authorized for detection and/or diagnosis of SARS-CoV-2 by FDA under an Emergency Use Authorization (EUA). This EUA will remain  in effect (meaning this test can be used) for the duration of the COVID-19 declaration under Section 564(b)(1) of the Act, 21 U.S.C.section 360bbb-3(b)(1), unless the authorization is terminated  or revoked sooner.       Influenza A by PCR NEGATIVE NEGATIVE Final   Influenza B by PCR NEGATIVE NEGATIVE Final    Comment: (NOTE) The Xpert Xpress SARS-CoV-2/FLU/RSV plus assay is intended as an aid in the diagnosis of influenza from Nasopharyngeal swab specimens and should not be used as a sole basis for treatment. Nasal washings and aspirates are unacceptable for Xpert Xpress SARS-CoV-2/FLU/RSV testing.  Fact  Sheet for Patients: EntrepreneurPulse.com.au  Fact Sheet for Healthcare Providers: IncredibleEmployment.be  This test is not yet approved or cleared by the [place] FDA and has been authorized for detection and/or diagnosis of SARS-CoV-2 by FDA under an Emergency Use Authorization (EUA). This EUA will remain in effect (meaning this test can be used) for the duration of the COVID-19 declaration under Section 564(b)(1) of the Act, 21 U.S.C. section 360bbb-3(b)(1), unless the authorization is terminated or revoked.  Performed at [facility], [place] [address], [place], [place] [address]   Blood culture (routine x 2)     Status: None   Collection Time: 03/04/21  1:00 AM   Specimen: BLOOD  Result Value Ref Range Status   Specimen Description BLOOD SITE NOT SPECIFIED  Final   Special Requests AEROBIC BOTTLE ONLY Blood Culture adequate volume  Final   Culture   Final    NO GROWTH 5 DAYS Performed at [place] [address], [place], [place] [address]    Report Status 03/09/2021 FINAL  Final  Blood culture (routine x 2)     Status: None   Collection Time: 03/04/21  1:10 AM   Specimen: BLOOD  Result Value Ref Range Status   Specimen Description BLOOD LEFT ANTECUBITAL  Final   Special Requests   Final    BOTTLES DRAWN AEROBIC AND ANAEROBIC Blood Culture adequate volume   Culture   Final    NO GROWTH 5 DAYS Performed at [facility], [place] [address] [place], [place] [address]    Report Status 03/09/2021 FINAL  Final  MRSA Next Gen by PCR, Nasal     Status: None   Collection Time: 03/04/21  3:17 AM   Specimen: Nasal Mucosa; Nasal Swab  Result Value Ref Range Status   MRSA by PCR Next Gen NOT DETECTED NOT DETECTED Final    Comment: (NOTE) The GeneXpert MRSA Assay (FDA approved for NASAL specimens only), is one component of a comprehensive MRSA colonization surveillance program. It is not intended to diagnose MRSA  infection nor to guide or monitor treatment for MRSA infections. Test performance is not FDA approved in patients less than 52 years old. Performed at [facility], [place] [address], [place], [place] [address]   Varicella-zoster by PCR (Blood or Swab)     Status: None   Collection Time: 03/04/21 12:31 PM   Specimen: Nasal Mucosa; Sterile Swab  Result Value Ref Range Status   Varicella-Zoster, PCR Negative Negative  Final    Comment: (NOTE) No Varicella Zoster Virus DNA detected. This test was developed and its performance characteristics determined by [facility].  It has not been cleared or approved by the Food and Drug Administration.  The FDA has determined that such clearance or approval is not necessary. Performed At: [facility] [place], [place] [id number] Rush Farmer MD YI:[id number]      Imaging Studies  No results found. Medications   Scheduled Meds:  acetaminophen  650 mg Oral Q6H   allopurinol  100 mg Oral Daily   amLODipine  10 mg Oral Daily   apixaban  5 mg Oral BID   atorvastatin  10 mg Oral Daily   carvedilol  25 mg Oral BID WC   colchicine  0.3 mg Oral Daily   diclofenac Sodium  2 g Topical QID   DULoxetine  60 mg Oral Daily   gabapentin  200 mg Oral QHS   insulin aspart  0-9 Units Subcutaneous TID WC   insulin glargine-yfgn  10 Units Subcutaneous Daily   linezolid  600 mg Oral Q12H   Continuous Infusions:  sodium chloride 150 mL/hr at 03/12/21 1030     LOS: 8 days   Time spent: >35 min   Richarda Osmond, DO [facility] 03/12/2021, 1:15 PM   To contact the [facility] Attending or Consulting provider for this patient: Check the care team in [facility] for a) attending/consulting [place] provider listed and b) the [facility] team listed Log into [email or web address] and use [employer] universal password to access. If you do not have the password, please contact the hospital operator. Locate the [facility] provider you are looking for under [facility] and page to a number that you can be directly reached. If you still have difficulty reaching the provider, please page the [facility] (Director on Call) for the Hospitalists listed on amion for assistance.

## 2021-03-13 DIAGNOSIS — M1000 Idiopathic gout, unspecified site: Secondary | ICD-10-CM | POA: Diagnosis not present

## 2021-03-13 DIAGNOSIS — L03211 Cellulitis of face: Secondary | ICD-10-CM | POA: Diagnosis not present

## 2021-03-13 LAB — BASIC METABOLIC PANEL
Anion gap: 7 (ref 5–15)
BUN: 19 mg/dL (ref 8–23)
CO2: 23 mmol/L (ref 22–32)
Calcium: 8.1 mg/dL — ABNORMAL LOW (ref 8.9–10.3)
Chloride: 103 mmol/L (ref 98–111)
Creatinine, Ser: 1.19 mg/dL — ABNORMAL HIGH (ref 0.44–1.00)
GFR, Estimated: 47 mL/min — ABNORMAL LOW (ref >60)
Glucose, Bld: 87 mg/dL (ref 70–99)
Potassium: 5.1 mmol/L (ref 3.5–5.1)
Sodium: 133 mmol/L — ABNORMAL LOW (ref 135–145)

## 2021-03-13 LAB — CBC
HCT: 30.1 % — ABNORMAL LOW (ref 36.0–46.0)
Hemoglobin: 9.4 g/dL — ABNORMAL LOW (ref 12.0–15.0)
MCH: 31 pg (ref 26.0–34.0)
MCHC: 31.2 g/dL (ref 30.0–36.0)
MCV: 99.3 fL (ref 80.0–100.0)
Platelets: 295 10*3/uL (ref 150–400)
RBC: 3.03 MIL/uL — ABNORMAL LOW (ref 3.87–5.11)
RDW: 14 % (ref 11.5–15.5)
WBC: 9.7 10*3/uL (ref 4.0–10.5)
nRBC: 0 % (ref 0.0–0.2)

## 2021-03-13 LAB — GLUCOSE, CAPILLARY
Glucose-Capillary: 83 mg/dL (ref 70–99)
Glucose-Capillary: 178 mg/dL — ABNORMAL HIGH (ref 70–99)
Glucose-Capillary: 159 mg/dL — ABNORMAL HIGH (ref 70–99)
Glucose-Capillary: 175 mg/dL — ABNORMAL HIGH (ref 70–99)

## 2021-03-13 MED ORDER — LINEZOLID 600 MG PO TABS: 600.0000 mg | ORAL_TABLET | Freq: Two times a day (BID) | ORAL | 0 refills | Status: DC

## 2021-03-13 NOTE — Progress Notes (Signed)
Occupational Therapy Treatment Patient Details Name: Sabrina Baker MRN: [id number] DOB: 05/12/1941 Today's Date: 03/13/2021   History of present illness 80 y.o. female with medical history significant for insulin-dependent type 2 diabetes, diabetic polyneuropathy, Charcot foot, CKD 3B, paroxysmal A. fib on Eliquis, hyperlipidemia, hypertension, tophaceous gout, iron deficiency anemia, anemia of chronic disease, chronic anxiety/depression, who presented to [facility] ED from home due to gradually worsening left facial cellulitis.   OT comments  Pt limited this session by pain from her knees. She requires max encouragement to participate in OOB task, refusing further mobility. Pt continues to refuse any therapy services once discharged, however she would benefit from them due to balance concerns and increased weakness. OT will continue to follow acutely.    Recommendations for follow up therapy are one component of a multi-disciplinary discharge planning process, led by the attending physician.  Recommendations may be updated based on patient status, additional functional criteria and insurance authorization.    Follow Up Recommendations  No OT follow up    Equipment Recommendations  None recommended by OT    Recommendations for Other Services      Precautions / Restrictions Precautions Precautions: Fall Precaution Comments: watch O2 sats Restrictions Weight Bearing Restrictions: No       Mobility Bed Mobility Overal bed mobility: Modified Independent Bed Mobility: Supine to Sit;Sit to Supine     Supine to sit: Modified independent (Device/Increase time);HOB elevated Sit to supine: Modified independent (Device/Increase time)   General bed mobility comments: Increased time to come to sitting, no difficulties returning to supine and scooting up in bed.    Transfers Overall transfer level: Needs assistance Equipment used: None Transfers: Sit to/from [facility] Sit  to Stand: Min assist;From elevated surface Stand pivot transfers: Min guard       General transfer comment: Pt requring min A to stand and steady, able to pivot to [facility] with no difficulties, min guard for line management    Balance Overall balance assessment: Mild deficits observed, not formally tested                                         ADL either performed or assessed with clinical judgement   ADL Overall ADL's : Needs assistance/impaired             Lower Body Bathing: Moderate assistance;Sit to/from stand;Sitting/lateral leans Lower Body Bathing Details (indicate cue type and reason): completed on BSC, needed assist for pericare     Lower Body Dressing: Minimal assistance;Sitting/lateral leans;Sit to/from stand Lower Body Dressing Details (indicate cue type and reason): Pt able to don L shoe EOB, needed assist with R shoe due to pain in R knee Toilet Transfer: Minimal assistance;Stand-pivot Toilet Transfer Details (indicate cue type and reason): Pivotted to [facility] with min A Toileting- Clothing Manipulation and Hygiene: Moderate assistance;Sitting/lateral lean;Sit to/from stand Toileting - Clothing Manipulation Details (indicate cue type and reason): For pericare     Functional mobility during ADLs: Minimal assistance General ADL Comments: Pt requiring max encouragement to participate, limited mobility due to pain from gout     Vision       Perception     Praxis      Cognition Arousal/Alertness: Awake/alert Behavior During Therapy: WFL for tasks assessed/performed Overall Cognitive Status: Within Functional Limits for tasks assessed  General Comments: Requires max encouragement to participate        Exercises     Shoulder Instructions       General Comments VSS on RA    Pertinent Vitals/ Pain       Pain Assessment: Faces Faces Pain Scale: Hurts even more Pain Location: knees, L >R but  improved from yesterday Pain Descriptors / Indicators: Discomfort;Sore Pain Intervention(s): Limited activity within patient's tolerance;Monitored during session;Repositioned  Home Living                                          Prior Functioning/Environment              Frequency  Min 2X/week        Progress Toward Goals  OT Goals(current goals can now be found in the care plan section)  Progress towards OT goals: Not progressing toward goals - comment  Acute Rehab OT Goals Patient Stated Goal: to get back to school/church events OT Goal Formulation: With patient Time For Goal Achievement: 03/18/21 Potential to Achieve Goals: Good ADL Goals Pt Will Perform Lower Body Bathing: with modified independence;sit to/from stand Pt Will Perform Upper Body Dressing: with modified independence;sitting Pt Will Perform Lower Body Dressing: with modified independence;sit to/from stand Pt Will Transfer to Toilet: with modified independence;ambulating Pt Will Perform Toileting - Clothing Manipulation and hygiene: with modified independence Additional ADL Goal #1: Pt will independently verbalize 3 strategies to reduce risk of falls  Plan Discharge plan remains appropriate    Co-evaluation                 AM-PAC OT "6 Clicks" Daily Activity     Outcome Measure   Help from another person eating meals?: None Help from another person taking care of personal grooming?: None Help from another person toileting, which includes using toliet, bedpan, or urinal?: A Little Help from another person bathing (including washing, rinsing, drying)?: A Little Help from another person to put on and taking off regular upper body clothing?: None Help from another person to put on and taking off regular lower body clothing?: A Little 6 Click Score: 21    End of Session Equipment Utilized During Treatment: Gait belt  OT Visit Diagnosis: Unsteadiness on feet  (R26.81);Pain Pain - Right/Left: Right Pain - part of body: Knee;Leg   Activity Tolerance Patient tolerated treatment well   Patient Left in bed;with call bell/phone within reach   Nurse Communication Mobility status (pt requesting cough medicine)        Time: 9450-3888 OT Time Calculation (min): 37 min  Charges: OT General Charges $OT Visit: 1 Visit OT Treatments $Self Care/Home Management : 23-37 mins  Taylin Mans H., OTR/L Acute Rehabilitation  Mitchell Epling Elane Olive Zmuda 03/13/2021, 4:47 PM

## 2021-03-14 DIAGNOSIS — L03211 Cellulitis of face: Secondary | ICD-10-CM | POA: Diagnosis not present

## 2021-03-14 DIAGNOSIS — M1000 Idiopathic gout, unspecified site: Secondary | ICD-10-CM | POA: Diagnosis not present

## 2021-03-14 LAB — GLUCOSE, CAPILLARY
Glucose-Capillary: 61 mg/dL — ABNORMAL LOW (ref 70–99)
Glucose-Capillary: 212 mg/dL — ABNORMAL HIGH (ref 70–99)
Glucose-Capillary: 148 mg/dL — ABNORMAL HIGH (ref 70–99)
Glucose-Capillary: 132 mg/dL — ABNORMAL HIGH (ref 70–99)

## 2021-03-14 MED ORDER — DULAGLUTIDE 1.5 MG/0.5ML ~~LOC~~ SOAJ: 3.0000 mg | SUBCUTANEOUS | Status: DC

## 2021-03-14 NOTE — TOC Progression Note (Signed)
Transition of Care [facility]) - Progression Note    Patient Details  Name: Sabrina Baker MRN: [id number] Date of Birth: [birth year]  Transition of Care [facility]) CM/SW Contact  [name], LCSW Phone Number: 03/14/2021, 5:12 PM  Clinical Narrative:  Talked with patient earlier today (1:42 pm) regarding SNF placement and Medicare.gov SNF list provided. Patient requested that her information be sent out in [facility] and her SNF preference is [facility]. Patient in agreement with ST rehab and indicated that she lives with her 65 year old son who has health issues and can't care for her.  Visited later with patient, (4:41 pm) and she was informed that [facility] declined due to no bed availability. Call made to [facility] while in room with patient and and Elyse Hsu does not expect any discharges in the next few days. Ms. Malecki requested that CSW contact Ritta Slot and was informed that contact would be made with facility.    Expected Discharge Plan: Home/Self Care Barriers to Discharge: No Barriers Identified  Expected Discharge Plan and Services Expected Discharge Plan: Home/Self Care   Discharge Planning Services: CM Consult, NA Post Acute Care Choice: NA Living arrangements for the past 2 months: Single Family Home Expected Discharge Date: 03/13/21               DME Arranged: N/A DME Agency: NA       HH Arranged: NA HH Agency: NA         Social Determinants of Health (SDOH) Interventions    Readmission Risk Interventions No flowsheet data found.

## 2021-03-14 NOTE — Progress Notes (Signed)
SATURATION QUALIFICATIONS: (This note is used to comply with regulatory documentation for home oxygen)  Patient Saturations on Room Air at Rest = 82%  Patient Saturations on Room Air while Ambulating = 80%  Patient Saturations on 2 Liters of oxygen while Ambulating = 93%  Please briefly explain why patient needs home oxygen: Patient desats while sitting on room air and while walking.

## 2021-03-14 NOTE — TOC Progression Note (Signed)
Transition of Care [facility]) - Progression Note    Patient Details  Name: Sabrina Baker MRN: [id number] Date of Birth: [birth year]  Transition of Care [facility]) CM/SW Contact  Tom-Johnson, Renea Ee, RN Phone Number: 03/14/2021, 3:00 PM  Clinical Narrative:    Patient now requesting to go to rehab. States she did not think her gout to her knees will be this bad and that she is concerned about going home with home oxygen as this is new. Wants to go to rehab for a few days or weeks before going home. MD and Social Worker made aware. CM will continue to follow with needs.   Expected Discharge Plan: Home/Self Care Barriers to Discharge: No Barriers Identified  Expected Discharge Plan and Services Expected Discharge Plan: Home/Self Care   Discharge Planning Services: CM Consult, NA Post Acute Care Choice: NA Living arrangements for the past 2 months: Single Family Home Expected Discharge Date: 03/13/21               DME Arranged: N/A DME Agency: NA       HH Arranged: NA HH Agency: NA         Social Determinants of Health (SDOH) Interventions    Readmission Risk Interventions No flowsheet data found.

## 2021-03-14 NOTE — NC FL2 (Signed)
[place] LEVEL OF CARE SCREENING TOOL     IDENTIFICATION  Patient Name: Sabrina Baker Birthdate: 02-20-1941 Sex: female Admission Date (Current Location): 03/03/2021  [facility] and [place] Number:  Herbalist and Address:  [facility] 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   Staph aureus infection 11/20/2019   Cellulitis 11/19/2019   Atrial fibrillation ([place]) 12/23/2018   ARDS (adult respiratory distress syndrome) ([place])    COVID-19    Mixed dyslipidemia 07/10/2016   Osteopenia 07/10/2016   Vitamin B12 deficiency 07/10/2016   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 Patient is a vegetarian)  AMBULATORY STATUS COMMUNICATION OF NEEDS Skin   Limited Assist (Min assist) Verbally Other (Comment) (Left facial woud - soft tissue cellukitis: Xeroform gauze over wound with foam dressing.  Change guaze daily)                       Personal Care Assistance Level of Assistance  Bathing, Feeding, Dressing Bathing Assistance: Maximum assistance (Lower body mod assist) Feeding assistance: Limited assistance (Assistance with set-up) Dressing Assistance: Maximum assistance (Lower body mod assist)     Functional Limitations Info  Sight, Hearing, Speech Sight Info: Impaired (Wears glasses) Hearing Info: Adequate Speech Info: Adequate    SPECIAL CARE FACTORS FREQUENCY  PT (By licensed PT), OT (By licensed OT)     PT Frequency: Evaluated 9/24 - PT at SNF eval and treat, a minimum of 5 days per week OT Frequency: Evaluated 9/23. OT at SNF eval and treat, a minimum of 5 days per week            Contractures Contractures Info: Not present    Additional Factors Info  Code Status, Allergies Code Status Info: Full Allergies Info: Ramipril, Other, Cortisone, Niacin, Niacin And Related, Valsartan           Current Medications (03/14/2021):  This is the current hospital active medication list Current Facility-Administered Medications  Medication Dose Route Frequency Provider Last Rate Last Admin   acetaminophen (TYLENOL) tablet 650 mg  650 mg Oral Q6H Doristine Mango L, MD   650 mg at 03/14/21 1142   allopurinol (ZYLOPRIM) tablet 100 mg  100 mg Oral Daily Irene Pap N, DO   100 mg at 03/14/21 1142   amLODipine (NORVASC) tablet 10 mg  10 mg Oral Daily Irene Pap N, DO   10  mg at 03/14/21 1142   apixaban (ELIQUIS) tablet 5 mg  5 mg Oral BID Barb Merino, MD   5 mg at 03/14/21 1142   atorvastatin (LIPITOR) tablet 10 mg  10 mg Oral Daily Irene Pap N, DO   10 mg at 03/14/21 1142   bacitracin-polymyxin b (POLYSPORIN) ophthalmic ointment 1 application  1 application Left Eye Daily PRN Kayleen Memos, DO       carvedilol (COREG) tablet 25 mg  25 mg Oral BID WC Hall, Carole N, DO   25 mg at 03/14/21 1142   colchicine tablet 0.3 mg  0.3 mg Oral Daily Richarda Osmond, MD   0.3 mg at 03/13/21 0831   diclofenac Sodium (VOLTAREN) 1 % topical gel 2 g  2 g Topical QID Barb Merino, MD   2 g at 03/13/21 2149   DULoxetine (CYMBALTA) DR capsule 60 mg  60 mg Oral Daily Kayleen Memos, DO   60 mg at 03/14/21 1142   gabapentin (NEURONTIN) capsule 200 mg  200 mg Oral QHS Irene Pap N, DO   200 mg at 03/13/21 2149   guaiFENesin (ROBITUSSIN) 100 MG/5ML solution 100 mg  5 mL Oral Q4H PRN Barb Merino, MD   100 mg at 03/14/21 1143   insulin aspart (novoLOG) injection 0-9 Units  0-9 Units Subcutaneous TID WC Irene Pap N, DO   3 Units at 03/14/21 1143   insulin glargine-yfgn (SEMGLEE) injection 10 Units  10 Units Subcutaneous Daily Richarda Osmond, MD   10 Units at 03/13/21 0831   melatonin tablet 3 mg  3 mg Oral QHS PRN Irene Pap N, DO   3 mg at 03/13/21 2149   oxyCODONE (Oxy IR/ROXICODONE) immediate release tablet 5 mg  5 mg Oral Q6H PRN Irene Pap N, DO   5 mg at 03/14/21 1143   polyethylene glycol (MIRALAX / GLYCOLAX) packet 17 g  17 g Oral Daily PRN Kayleen Memos, DO         Discharge Medications: Please see discharge summary for a list of discharge medications.  Relevant Imaging Results:  Relevant Lab Results:   Additional Information ss# [id number]. Patient has had COVID vaccinations (Moderna): 2/25, 3/25, 05/11/20 and 11/23/20.  Sable Feil, LCSW

## 2021-03-14 NOTE — Progress Notes (Addendum)
PROGRESS NOTE  TAHEERA THOMANN    DOB: January 25, 1941, 80 y.o.  TMA:[id number]  PCP: [name], MD   Code Status: Full Code   DOA: 03/03/2021   LOS: 10  Brief Narrative of Current Hospitalization  Sabrina Baker is a 80 y.o. female with a PMH significant for insulin-dependent type 2 diabetes, Charcot foot, CKD stage IIIb, A. 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 They presented from home to the ED on 03/03/2021 with swelling, erythema, purulence from left cheek x 3 days. In the ED, it was found that they had cellulitis as demonstrated on maxillofacial CT. They were treated with IV antibiotics.  ENT, rheumatology and ID were also consulted. Patient was admitted to medicine service for further workup and management of cellulitis as outlined in detail below.  03/14/21 -stable, no change in status  Assessment & Plan  Active Problems:   Facial cellulitis   Acute idiopathic gout  Polyarthritis/acute gouty attack on right thumb and knee- previously patient was able to work with PT/OT and declined the recommended [facility] PT. She is now stating that she would like to go to SNF and is not participating with PT as much. -Continue allopurinol, colchicine -Rheumatology OP - continue PRN oxy IR and tylenol - TOC consulted for SNF placement  Facial cellulitis/carbuncle- afebrile, improving.  Blood cultures negative. Patient has no complaints on her face. -ID signed off      -Continue linezolid until 10/3 - ENT signed off - WOC s/o - pain control PRN  AKI with Hyponatremia, hyperkalemia- resolved. Patient was at baseline when discharged yesterday. - discontinue IV maintenance.  - strict I/O - BMP am  Insulin dependent Type 2 diabetes- fasting glucose this am 61. Hgb A1c was 8.1 on admission. Takes trulicity at home as well as insulin. - continue sSSI - decreased home Semglee to 10units daily am -Continue atorvastatin - restarting home  trulicity  HTN- chronic, stable -Continue amlodipine, carvedilol  A. fib-rate controlled -Continue Eliquis -Continue Coreg  Anxiety/depression- -Continue duloxetine  DVT prophylaxis:  apixaban (ELIQUIS) tablet 5 mg   Diet:  Diet Orders (From admission, onward)     Start     Ordered   03/10/21 1549  Diet heart healthy/carb modified Room service appropriate? Yes; Fluid consistency: Thin; Fluid restriction: 1200 mL Fluid  Diet effective now       Comments: Patient is Vegetarian.  Question Answer Comment  Diet-HS Snack? Nothing   Room service appropriate? Yes   Fluid consistency: Thin   Fluid restriction: 1200 mL Fluid      03/10/21 1549            Subjective 03/14/21    Pt feels pretty good today. She is ready to go home. Continues to complain of chronic knee pain.   Disposition Plan & Communication  Status is: Inpatient  Remains inpatient appropriate because:Unsafe d/c plan  Dispo:  Patient From: Home  Planned Disposition: Home 10/2 pending AKI improvement  Medically stable for discharge: No     Family Communication: none   Consults, Procedures, Significant Events  Consultants:  ID Rheumatology ENT  Procedures/significant events:  none  Antimicrobials:  Anti-infectives (From admission, onward)    Start     Dose/Rate Route Frequency Ordered Stop   03/13/21 0000  linezolid (ZYVOX) 600 MG tablet       Note to Pharmacy: Stop date of 03/13/21   600 mg Oral 2 times daily 03/13/21 1005 03/16/21 2359  03/08/21 0000  linezolid (ZYVOX) 600 MG tablet  Status:  Discontinued       Note to Pharmacy: Stop date of 03/13/21   600 mg Oral 2 times daily 03/08/21 1135 03/13/21    03/04/21 2200  cefTRIAXone (ROCEPHIN) 2 g in sodium chloride 0.9 % 100 mL IVPB  Status:  Discontinued        2 g 200 mL/hr over 30 Minutes Intravenous Every 24 hours 03/04/21 0327 03/04/21 1444   03/04/21 1600  valACYclovir (VALTREX) tablet 1,000 mg  Status:  Discontinued        1,000 mg  Oral 3 times daily 03/04/21 1444 03/04/21 1446   03/04/21 1500  valACYclovir (VALTREX) tablet 1,000 mg  Status:  Discontinued        1,000 mg Oral Daily 03/04/21 1446 03/08/21 1049   03/04/21 0300  linezolid (ZYVOX) tablet 600 mg        600 mg Oral Every 12 hours 03/04/21 0245 03/13/21 2149   03/04/21 0230  cefTRIAXone (ROCEPHIN) 2 g in sodium chloride 0.9 % 100 mL IVPB        2 g 200 mL/hr over 30 Minutes Intravenous  Once 03/04/21 0218 03/04/21 0309        Objective   Vitals:   03/13/21 2037 03/14/21 0426 03/14/21 0908 03/14/21 1100  BP: 138/65 (!) 143/47 (!) 158/57 (!) 159/64  Pulse: 73 72 82 84  Resp: 18 18 19 17   Temp: 98.2 F (36.8 C) 97.9 F (36.6 C) 97.7 F (36.5 C) (!) 97.5 F (36.4 C)  TempSrc:  Oral Oral Oral  SpO2: 94% 95% 94% 93%  Weight:  83.5 kg    Height:        Intake/Output Summary (Last 24 hours) at 03/14/2021 1219 Last data filed at 03/14/2021 0800 Gross per 24 hour  Intake 4235.24 ml  Output 500 ml  Net 3735.24 ml    Filed Weights   03/08/21 2044 03/11/21 0517 03/14/21 0426  Weight: 75.9 kg 77.8 kg 83.5 kg    Patient BMI: Body mass index is 32.61 kg/m.   Physical Exam: General: awake, alert, NAD HEENT: atraumatic, clear conjunctiva, anicteric sclera, moist mucus membranes, hearing grossly normal. Lesion on left cheek has purulent drainage without surrounding erythema. Non-tender. Respiratory: CTAB,  normal respiratory effort. Cardiovascular: quick capillary refill  Nervous: A&O x2. no gross focal neurologic deficits Extremities: moves all equally, no edema, normal tone. No Tenderness to knees with mild to no swelling. R thumb normal temp, tender to ROM, no erythema, generalized swelling to thumb. Able to move legs in bed without pain Skin: dry, intact, normal temperature, normal color, No rashes, lesions or ulcers Psychiatry: pressured speech. Mildly anxious  Labs   I have personally reviewed following labs and imaging studies No results  displayed because visit has over 200 results.       Recent Results (from the past 240 hour(s))  Varicella-zoster by PCR (Blood or Swab)     Status: None   Collection Time: 03/04/21 12:31 PM   Specimen: Nasal Mucosa; Sterile Swab  Result Value Ref Range Status   Varicella-Zoster, PCR Negative Negative Final    Comment: (NOTE) No Varicella Zoster Virus DNA detected. This test was developed and its performance characteristics determined by [facility].  It has not been cleared or approved by the Food and Drug Administration.  The FDA has determined that such clearance or approval is not necessary. Performed At: [facility] [address] [place], [place] [id number] Rush Farmer  MD BO:[id number]      Imaging Studies  No results found. Medications   Scheduled Meds:  acetaminophen  650 mg Oral Q6H   allopurinol  100 mg Oral Daily   amLODipine  10 mg Oral Daily   apixaban  5 mg Oral BID   atorvastatin  10 mg Oral Daily   carvedilol  25 mg Oral BID WC   colchicine  0.3 mg Oral Daily   diclofenac Sodium  2 g Topical QID   DULoxetine  60 mg Oral Daily   gabapentin  200 mg Oral QHS   insulin aspart  0-9 Units Subcutaneous TID WC   insulin glargine-yfgn  10 Units Subcutaneous Daily   Continuous Infusions:  sodium chloride 150 mL/hr at 03/13/21 0606     LOS: 10 days   Time spent: >35 min   Richarda Osmond, DO [facility] 03/14/2021, 12:19 PM   To contact the [facility] Attending or Consulting provider for this patient: Check the care team in [facility] for a) attending/consulting [place] provider listed and b) the [facility] team listed Log into [email or web address] and use [place]'s universal password to access. If you do not have the password, please contact the hospital operator. Locate the [facility] provider you are looking for under [facility] and page to a number that you can be directly reached. If you still have difficulty reaching the provider, please page the [facility] (Director  on Call) for the Hospitalists listed on amion for assistance.

## 2021-03-14 NOTE — Progress Notes (Signed)
Physical Therapy Treatment Patient Details Name: Sabrina Baker MRN: [id number] DOB: [birth year] Today's Date: 03/14/2021   History of Present Illness 80 y.o. female with medical history significant for insulin-dependent type 2 diabetes, diabetic polyneuropathy, Charcot foot, CKD 3B, paroxysmal A. fib on Eliquis, hyperlipidemia, hypertension, tophaceous gout, iron deficiency anemia, anemia of chronic disease, chronic anxiety/depression, who presented to [facility] ED from home due to gradually worsening left facial cellulitis.    PT Comments    Pt with improved knee pain and activity tolerance this session. Continues to require assist for lower body dressing I.e. donning socks/shoes and for peri care post toileting. Pt ambulating 100 feet with a walker at a min assist level. Presents as a high fall risk based on decreased gait speed. Pt now requesting SNF and states she does not recall prior refusal. This is appropriate in light of her deficits and decreased caregiver support in order to maximize functional mobility.     Recommendations for follow up therapy are one component of a multi-disciplinary discharge planning process, led by the attending physician.  Recommendations may be updated based on patient status, additional functional criteria and insurance authorization.  Follow Up Recommendations  SNF     Equipment Recommendations  None recommended by PT    Recommendations for Other Services       Precautions / Restrictions Precautions Precautions: Fall;Other (comment) Precaution Comments: watch O2 Restrictions Weight Bearing Restrictions: No     Mobility  Bed Mobility Overal bed mobility: Modified Independent                  Transfers Overall transfer level: Needs assistance Equipment used: None Transfers: Sit to/from Stand;Stand Pivot Transfers Sit to Stand: Min assist Stand pivot transfers: Min assist       General transfer comment: MinA to steady with no AD for  transfer on and off BSC  Ambulation/Gait Ambulation/Gait assistance: Min assist Gait Distance (Feet): 100 Feet Assistive device: Rolling walker (2 wheeled) Gait Pattern/deviations: Step-through pattern;Decreased stride length;Trunk flexed Gait velocity: decreased Gait velocity interpretation: <1.8 ft/sec, indicate of risk for recurrent falls General Gait Details: slow pace, decreased fluidity of gait with frequent, brief stops/starts, light minA for balance.   Stairs             Wheelchair Mobility    Modified Rankin (Stroke Patients Only)       Balance Overall balance assessment: Needs assistance Sitting-balance support: Feet supported Sitting balance-Leahy Scale: Good     Standing balance support: Bilateral upper extremity supported Standing balance-Leahy Scale: Poor Standing balance comment: B UE support due to B knee pain. Min guard for safety                            Cognition Arousal/Alertness: Awake/alert Behavior During Therapy: WFL for tasks assessed/performed Overall Cognitive Status: Impaired/Different from baseline Area of Impairment: Memory                     Memory: Decreased short-term memory         General Comments: Pt states she does not recall declining SNF      Exercises      General Comments        Pertinent Vitals/Pain Pain Assessment: Faces Faces Pain Scale: Hurts little more Pain Location: knees, L > R Pain Descriptors / Indicators: Discomfort;Sore Pain Intervention(s): Limited activity within patient's tolerance;Monitored during session    Home Living  Prior Function            PT Goals (current goals can now be found in the care plan section) Acute Rehab PT Goals Patient Stated Goal: shower Potential to Achieve Goals: Good Progress towards PT goals: Progressing toward goals    Frequency    Min 3X/week      PT Plan Discharge plan needs to be updated     Co-evaluation              AM-PAC PT "6 Clicks" Mobility   Outcome Measure  Help needed turning from your back to your side while in a flat bed without using bedrails?: None Help needed moving from lying on your back to sitting on the side of a flat bed without using bedrails?: None Help needed moving to and from a bed to a chair (including a wheelchair)?: A Little Help needed standing up from a chair using your arms (e.g., wheelchair or bedside chair)?: A Little Help needed to walk in hospital room?: A Little Help needed climbing 3-5 steps with a railing? : A Lot 6 Click Score: 19    End of Session Equipment Utilized During Treatment: Gait belt;Oxygen Activity Tolerance: Patient tolerated treatment well Patient left: in chair;with call bell/phone within reach;with chair alarm set Nurse Communication: Mobility status PT Visit Diagnosis: Other abnormalities of gait and mobility (R26.89);Pain;Difficulty in walking, not elsewhere classified (R26.2)     Time: 9563-8756 PT Time Calculation (min) (ACUTE ONLY): 24 min  Charges:  $Therapeutic Activity: 23-37 mins                     Wyona Almas, PT, DPT Acute Rehabilitation Services Pager [phone] Office [phone]    Deno Etienne 03/14/2021, 10:46 AM

## 2021-03-15 DIAGNOSIS — M1000 Idiopathic gout, unspecified site: Secondary | ICD-10-CM | POA: Diagnosis not present

## 2021-03-15 DIAGNOSIS — L03211 Cellulitis of face: Secondary | ICD-10-CM | POA: Diagnosis not present

## 2021-03-15 LAB — GLUCOSE, CAPILLARY
Glucose-Capillary: 153 mg/dL — ABNORMAL HIGH (ref 70–99)
Glucose-Capillary: 117 mg/dL — ABNORMAL HIGH (ref 70–99)
Glucose-Capillary: 121 mg/dL — ABNORMAL HIGH (ref 70–99)
Glucose-Capillary: 151 mg/dL — ABNORMAL HIGH (ref 70–99)

## 2021-03-15 LAB — RESP PANEL BY RT-PCR (FLU A&B, COVID) ARPGX2
Influenza A by PCR: NEGATIVE
Influenza B by PCR: NEGATIVE
SARS Coronavirus 2 by RT PCR: NEGATIVE

## 2021-03-15 NOTE — Plan of Care (Signed)

## 2021-03-15 NOTE — Plan of Care (Signed)

## 2021-03-15 NOTE — Progress Notes (Signed)
PROGRESS NOTE  Sabrina Baker    DOB: 12/27/1940, 80 y.o.  UMP:[id number]  PCP: [name], MD   Code Status: Full Code   DOA: 03/03/2021   LOS: 11  Brief Narrative of Current Hospitalization  Sabrina Baker is a 80 y.o. female with a PMH significant for insulin-dependent type 2 diabetes, Charcot foot, CKD stage IIIb, A. 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 They presented from home to the ED on 03/03/2021 with swelling, erythema, purulence from left cheek x 3 days. In the ED, it was found that they had cellulitis as demonstrated on maxillofacial CT. They were treated with IV antibiotics.  ENT, rheumatology and ID were also consulted. Sabrina Baker was admitted to medicine service for further workup and management of cellulitis as outlined in detail below.  03/15/21 -sitting up at commode without knee pain  Assessment & Plan  Active Problems:   Facial cellulitis   Acute idiopathic gout  Polyarthritis/acute gouty attack on right thumb and knees-Sabrina Baker declined home health PT and had worsening participation with PT and requested SNF placement.  Sabrina Baker has been stable and had improvement in her knee pain.  She is awaiting SNF placement at this time. -Continue allopurinol, colchicine -Rheumatology OP - continue PRN oxy IR and tylenol - TOC consulted for SNF placement  Facial cellulitis/carbuncle- afebrile, improving.  Blood cultures negative. Sabrina Baker has no complaints on her face.  Lesion looks to be drying and healing today.  Recommending Sabrina Baker have shower today. -ID signed off      -Continue linezolid until 10/3 - ENT signed off - WOC s/o - pain control PRN  AKI with Hyponatremia, hyperkalemia- resolved.  Sabrina Baker has maintained baseline kidney function. - strict I/O - BMP am  Insulin dependent Type 2 diabetes- fasting glucose this am 153. Hgb A1c was 8.1 on admission. Takes trulicity at home as well as insulin. - continue  sSSI - decreased home Semglee to 10units daily am for history of fasting hypoglycemia this admission -Continue atorvastatin - restarting home trulicity when able (not on formulary)  HTN- chronic, stable -Continue amlodipine, carvedilol  A. fib-rate controlled -Continue Eliquis -Continue Coreg  Anxiety/depression- -Continue duloxetine  DVT prophylaxis:  apixaban (ELIQUIS) tablet 5 mg   Diet:  Diet Orders (From admission, onward)     Start     Ordered   03/10/21 1549  Diet heart healthy/carb modified Room service appropriate? Yes; Fluid consistency: Thin; Fluid restriction: 1200 mL Fluid  Diet effective now       Comments: Sabrina Baker is Vegetarian.  Question Answer Comment  Diet-HS Snack? Nothing   Room service appropriate? Yes   Fluid consistency: Thin   Fluid restriction: 1200 mL Fluid      03/10/21 1549            Subjective 03/15/21   Sabrina Baker endorses greatly improved pain in her knees today.  She would like to take a shower.  She continues to look forward to going to SNF.  Disposition Plan & Communication  Status is: Inpatient  Remains inpatient appropriate because:Unsafe d/c plan  Dispo:  Sabrina Baker From: Home  Planned Disposition: SNF  Medically stable for discharge: No     Family Communication: none   Consults, Procedures, Significant Events  Consultants:  ID Rheumatology ENT  Procedures/significant events:  none  Antimicrobials:  Anti-infectives (From admission, onward)    Start     Dose/Rate Route Frequency Ordered Stop   03/13/21 0000  linezolid (ZYVOX) 600 MG  tablet       Note to Pharmacy: Stop date of 03/13/21   600 mg Oral 2 times daily 03/13/21 1005 03/16/21 2359   03/08/21 0000  linezolid (ZYVOX) 600 MG tablet  Status:  Discontinued       Note to Pharmacy: Stop date of 03/13/21   600 mg Oral 2 times daily 03/08/21 1135 03/13/21    03/04/21 2200  cefTRIAXone (ROCEPHIN) 2 g in sodium chloride 0.9 % 100 mL IVPB  Status:  Discontinued         2 g 200 mL/hr over 30 Minutes Intravenous Every 24 hours 03/04/21 0327 03/04/21 1444   03/04/21 1600  valACYclovir (VALTREX) tablet 1,000 mg  Status:  Discontinued        1,000 mg Oral 3 times daily 03/04/21 1444 03/04/21 1446   03/04/21 1500  valACYclovir (VALTREX) tablet 1,000 mg  Status:  Discontinued        1,000 mg Oral Daily 03/04/21 1446 03/08/21 1049   03/04/21 0300  linezolid (ZYVOX) tablet 600 mg        600 mg Oral Every 12 hours 03/04/21 0245 03/13/21 2149   03/04/21 0230  cefTRIAXone (ROCEPHIN) 2 g in sodium chloride 0.9 % 100 mL IVPB        2 g 200 mL/hr over 30 Minutes Intravenous  Once 03/04/21 0218 03/04/21 0309        Objective   Vitals:   03/14/21 1100 03/14/21 1739 03/14/21 2029 03/15/21 0544  BP: (!) 159/64 140/60 (!) 143/50 140/60  Pulse: 84 77 80 75  Resp: 17 19 18 19   Temp: (!) 97.5 F (36.4 C) 98.3 F (36.8 C) 98 F (36.7 C) 97.9 F (36.6 C)  TempSrc: Oral  Oral Oral  SpO2: 93% 91% 94% 92%  Weight:      Height:        Intake/Output Summary (Last 24 hours) at 03/15/2021 0753 Last data filed at 03/15/2021 6301 Gross per 24 hour  Intake 1460 ml  Output --  Net 1460 ml    Filed Weights   03/08/21 2044 03/11/21 0517 03/14/21 0426  Weight: 75.9 kg 77.8 kg 83.5 kg    Sabrina Baker BMI: Body mass index is 32.61 kg/m.   Physical Exam: General: awake, alert, NAD HEENT: atraumatic, clear conjunctiva, anicteric sclera, moist mucus membranes, hearing grossly normal. Lesion on left cheek has purulent drainage without surrounding erythema. Non-tender. Respiratory: CTAB,  normal respiratory effort. Cardiovascular: quick capillary refill  Nervous: A&O x2. no gross focal neurologic deficits Extremities: moves all equally, no edema, normal tone. No Tenderness to knees with mild to no swelling. R thumb normal temp, tender to ROM, no erythema, generalized swelling to thumb. Able to move legs i while on commode without pain. Skin: dry, intact, normal temperature,  normal color, No rashes, lesions or ulcers Psychiatry: pressured speech. Mildly anxious  Labs   Glucose 153 Imaging Studies  No results found. Medications   Scheduled Meds:  acetaminophen  650 mg Oral Q6H   allopurinol  100 mg Oral Daily   amLODipine  10 mg Oral Daily   apixaban  5 mg Oral BID   atorvastatin  10 mg Oral Daily   carvedilol  25 mg Oral BID WC   colchicine  0.3 mg Oral Daily   diclofenac Sodium  2 g Topical QID   DULoxetine  60 mg Oral Daily   gabapentin  200 mg Oral QHS   insulin aspart  0-9 Units Subcutaneous TID WC  insulin glargine-yfgn  10 Units Subcutaneous Daily   Continuous Infusions:    LOS: 11 days   Time spent: >35 min   Richarda Osmond, DO [facility] 03/15/2021, 7:53 AM   To contact the [facility] Attending or Consulting provider for this Sabrina Baker: Check the care team in [facility] for a) attending/consulting [place] provider listed and b) the [facility] team listed Log into [email or web address] and use [place] universal password to access. If you do not have the password, please contact the hospital operator. Locate the [facility] provider you are looking for under [facility] and page to a number that you can be directly reached. If you still have difficulty reaching the provider, please page the [facility] (Director on Call) for the Hospitalists listed on amion for assistance.

## 2021-03-15 NOTE — TOC Progression Note (Signed)
Transition of Care [facility]) - Progression Note    Patient Details  Name: Sabrina Baker MRN: [id number] Date of Birth: 1940/12/14  Transition [facility]) CM/SW Contact  [name], LCSW Phone Number: 03/15/2021, 3:43 PM  Clinical Narrative:  Talked with patient at bedside and daughter by phone while with patient regarding SNF choice and [name] selected. Contacted [name], admissions director and informed her regarding patient and that authorization would be initiated today.   Initiated Ship broker via [employer] and auth pending - Auth ID number [id number].  [name] contacted and updated regarding insurance auth pending.    Expected Discharge Plan: Home/Self Care Barriers to Discharge: No Barriers Identified  Expected Discharge Plan and Services Expected Discharge Plan: Home/Self Care   Discharge Planning Services: CM Consult, NA Post Acute Care Choice: NA Living arrangements for the past 2 months: Single Family Home Expected Discharge Date: 03/13/21               DME Arranged: N/A DME Agency: NA       HH Arranged: NA HH Agency: NA         Social Determinants of Health (SDOH) Interventions    Readmission Risk Interventions No flowsheet data found.

## 2021-03-16 ENCOUNTER — Inpatient Hospital Stay (HOSPITAL_COMMUNITY): Payer: Medicare Other

## 2021-03-16 ENCOUNTER — Ambulatory Visit: Payer: Medicare Other | Admitting: Podiatry

## 2021-03-16 DIAGNOSIS — R0902 Hypoxemia: Secondary | ICD-10-CM

## 2021-03-16 DIAGNOSIS — L03211 Cellulitis of face: Secondary | ICD-10-CM | POA: Diagnosis not present

## 2021-03-16 LAB — CBC
HCT: 32 % — ABNORMAL LOW (ref 36.0–46.0)
Hemoglobin: 10.2 g/dL — ABNORMAL LOW (ref 12.0–15.0)
MCH: 31.9 pg (ref 26.0–34.0)
MCHC: 31.9 g/dL (ref 30.0–36.0)
MCV: 100 fL (ref 80.0–100.0)
Platelets: 258 10*3/uL (ref 150–400)
RBC: 3.2 MIL/uL — ABNORMAL LOW (ref 3.87–5.11)
RDW: 14.4 % (ref 11.5–15.5)
WBC: 15.1 10*3/uL — ABNORMAL HIGH (ref 4.0–10.5)
nRBC: 0 % (ref 0.0–0.2)

## 2021-03-16 LAB — GLUCOSE, CAPILLARY
Glucose-Capillary: 116 mg/dL — ABNORMAL HIGH (ref 70–99)
Glucose-Capillary: 163 mg/dL — ABNORMAL HIGH (ref 70–99)
Glucose-Capillary: 111 mg/dL — ABNORMAL HIGH (ref 70–99)
Glucose-Capillary: 154 mg/dL — ABNORMAL HIGH (ref 70–99)

## 2021-03-16 LAB — BASIC METABOLIC PANEL
Anion gap: 7 (ref 5–15)
BUN: 11 mg/dL (ref 8–23)
CO2: 25 mmol/L (ref 22–32)
Calcium: 8.7 mg/dL — ABNORMAL LOW (ref 8.9–10.3)
Chloride: 103 mmol/L (ref 98–111)
Creatinine, Ser: 0.85 mg/dL (ref 0.44–1.00)
GFR, Estimated: >60 mL/min (ref >60)
Glucose, Bld: 127 mg/dL — ABNORMAL HIGH (ref 70–99)
Potassium: 4.9 mmol/L (ref 3.5–5.1)
Sodium: 135 mmol/L (ref 135–145)

## 2021-03-16 LAB — BRAIN NATRIURETIC PEPTIDE: B Natriuretic Peptide: 276.9 pg/mL — ABNORMAL HIGH (ref 0.0–100.0)

## 2021-03-16 MED ORDER — LATANOPROST 0.005 % OP SOLN
1.0000 [drp] | Freq: Every day | OPHTHALMIC | Status: DC
  Administered 2021-03-16 – 2021-03-22 (×7): 1 [drp] via OPHTHALMIC
  Filled 2021-03-16: qty 2.5

## 2021-03-16 MED ORDER — HYDROCORTISONE (PERIANAL) 2.5 % EX CREA
TOPICAL_CREAM | Freq: Two times a day (BID) | CUTANEOUS | Status: AC
  Administered 2021-03-19: 1 via RECTAL
  Filled 2021-03-16: qty 28.35

## 2021-03-16 MED ORDER — FUROSEMIDE 10 MG/ML IJ SOLN
40.0000 mg | Freq: Once | INTRAMUSCULAR | Status: AC
  Administered 2021-03-16: 40 mg via INTRAVENOUS
  Filled 2021-03-16: qty 4

## 2021-03-16 MED ORDER — FUROSEMIDE 10 MG/ML IJ SOLN: 20.0000 mg | Freq: Once | INTRAMUSCULAR | Status: DC

## 2021-03-16 NOTE — Progress Notes (Signed)
Physical Therapy Treatment Patient Details Name: Sabrina Baker MRN: [id number] DOB: 11-26-1940 Today's Date: 03/16/2021   History of Present Illness 80 y.o. female with medical history significant for insulin-dependent type 2 diabetes, diabetic polyneuropathy, Charcot foot, CKD 3B, paroxysmal A. fib on Eliquis, hyperlipidemia, hypertension, tophaceous gout, iron deficiency anemia, anemia of chronic disease, chronic anxiety/depression, who presented to [facility] ED from home due to gradually worsening left facial cellulitis.    PT Comments    Pt reporting mildly improved bilateral knee pain. During today's session, pt with increased oxygen requirement. After ambulating to bathroom and back, SpO2 78% on 2L O2. Bumped up to 4-6 L O2 for hallway ambulation, where her sats were 85-88%. Discussed with MD Florene Glen. Continue to recommend SNF for ongoing Physical Therapy.      Recommendations for follow up therapy are one component of a multi-disciplinary discharge planning process, led by the attending physician.  Recommendations may be updated based on patient status, additional functional criteria and insurance authorization.  Follow Up Recommendations  SNF     Equipment Recommendations  None recommended by PT    Recommendations for Other Services       Precautions / Restrictions Precautions Precautions: Fall;Other (comment) Precaution Comments: watch O2 Restrictions Weight Bearing Restrictions: No     Mobility  Bed Mobility               General bed mobility comments: OOB in chair    Transfers Overall transfer level: Needs assistance Equipment used: None Transfers: Sit to/from Stand Sit to Stand: Supervision            Ambulation/Gait Ambulation/Gait assistance: Min guard Gait Distance (Feet): 120 Feet Assistive device: 4-wheeled walker Gait Pattern/deviations: Step-through pattern;Decreased stride length;Trunk flexed Gait velocity: decreased   General Gait Details:  slow pace, decreased bilateral foot clearance, increased trunk flexion. needs cues for activity pacing   Stairs             Wheelchair Mobility    Modified Rankin (Stroke Patients Only)       Balance Overall balance assessment: Needs assistance Sitting-balance support: Feet supported Sitting balance-Leahy Scale: Good     Standing balance support: Bilateral upper extremity supported Standing balance-Leahy Scale: Poor Standing balance comment: B UE support due to B knee pain. Min guard for safety                            Cognition Arousal/Alertness: Awake/alert Behavior During Therapy: WFL for tasks assessed/performed Overall Cognitive Status: Impaired/Different from baseline Area of Impairment: Memory                     Memory: Decreased short-term memory                Exercises      General Comments        Pertinent Vitals/Pain Pain Assessment: Faces Faces Pain Scale: Hurts little more Pain Location: knees, buttocks Pain Descriptors / Indicators: Discomfort;Sore Pain Intervention(s): Monitored during session    Home Living                      Prior Function            PT Goals (current goals can now be found in the care plan section) Acute Rehab PT Goals Patient Stated Goal: still agreeable to go to rehab PT Goal Formulation: With patient Time For Goal Achievement: 03/30/21 Potential to Achieve  Goals: Good Progress towards PT goals: Progressing toward goals    Frequency    Min 2X/week      PT Plan Frequency needs to be updated    Co-evaluation              AM-PAC PT "6 Clicks" Mobility   Outcome Measure  Help needed turning from your back to your side while in a flat bed without using bedrails?: None Help needed moving from lying on your back to sitting on the side of a flat bed without using bedrails?: None Help needed moving to and from a bed to a chair (including a wheelchair)?: A  Little Help needed standing up from a chair using your arms (e.g., wheelchair or bedside chair)?: A Little Help needed to walk in hospital room?: A Little Help needed climbing 3-5 steps with a railing? : A Lot 6 Click Score: 19    End of Session Equipment Utilized During Treatment: Gait belt;Oxygen Activity Tolerance: Patient tolerated treatment well Patient left: in chair;with call bell/phone within reach;with chair alarm set Nurse Communication: Mobility status PT Visit Diagnosis: Other abnormalities of gait and mobility (R26.89);Pain;Difficulty in walking, not elsewhere classified (R26.2)     Time: 8875-7972 PT Time Calculation (min) (ACUTE ONLY): 35 min  Charges:  $Therapeutic Activity: 23-37 mins                     Wyona Almas, PT, DPT Acute Rehabilitation Services Pager [phone] Office [phone]    Deno Etienne 03/16/2021, 10:43 AM

## 2021-03-16 NOTE — TOC Progression Note (Addendum)
Transition of Care [facility]) - Progression Note    Patient Details  Name: Sabrina Baker MRN: [id number] Date of Birth: Mar 07, 1941  Transition of Care [facility]) CM/SW Contact  [name], LCSW Phone Number: 03/16/2021, 4:51 PM  Clinical Narrative:  Patient in need of SNF placement and [facility] cited as her preference. [facility] initially denied patient due to no bed availability, and [name] chosen. CSW talked later with Elyse Hsu at [facility] (10/4 at 4 pm) and she may be able could take patient on the 5th.     Initiated insurance authorization on 10/4 and call made today to [employer] and was informed that peer-to-peer offered. MD needs to call [phone], choose option 5 and provide patient's name, DOB and member ID. Call must be made by 1 pm. MD provided with information to do peer-to-peer.   2:12 pm: CSW received call from [place] with [employer] and case denied as patient not medically stable. CSW advised to resubmit for authorization when Ms. Zaccaro is medically stable. Caren Griffins advised CSW that she will stay tagged to this case.  3:04 pm: Elyse Hsu with [facility] contacted and updated. She requested to contacted once patient medically stable for discharge.  CSW will continue to follow and initiate insurance authorization when patient medically stable.   Expected Discharge Plan: Home/Self Care Barriers to Discharge: No Barriers Identified  Expected Discharge Plan and Services Expected Discharge Plan: Home/Self Care   Discharge Planning Services: CM Consult, NA Post Acute Care Choice: NA Living arrangements for the past 2 months: Single Family Home Expected Discharge Date: 03/13/21               DME Arranged: N/A DME Agency: NA       HH Arranged: NA HH Agency: NA         Social Determinants of Health (SDOH) Interventions  No SDOH interventions requested or needed at this time.  Readmission Risk Interventions No flowsheet data found.

## 2021-03-16 NOTE — Progress Notes (Signed)
PROGRESS NOTE    Sabrina Baker  IBB:[id number] DOB: 07/07/1940 DOA: 03/03/2021 PCP: [name], MD   No chief complaint on file.   Brief Narrative:  Sabrina Baker is Sabrina Baker 80 y.o. female with Sabrina Baker PMH significant for insulin-dependent type 2 diabetes, Charcot foot, CKD stage IIIb, Sabrina Baker. 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 They presented from home to the ED on 03/03/2021 with swelling, erythema, purulence from left cheek x 3 days. In the ED, it was found that they had cellulitis as demonstrated on maxillofacial CT. They were treated with IV antibiotics.  ENT, rheumatology and ID were also consulted. Patient was admitted to medicine service for further workup and management of cellulitis as outlined in detail below.   Assessment & Plan:   Active Problems:   Facial cellulitis   Acute idiopathic gout  Acute Hypoxic Respiratory Failure Elevated BNP, bilateral LE edema CXR with L basilar and potential R retrodiagphrgmatic airspace opacitiy Repeat CXR 10/6 Trial lasix and follow  Wean o2 as tolerated, w/u further as indicated  Polyarthritis/acute gouty attack on right thumb and knees-patient declined home health PT and had worsening participation with PT and requested SNF placement.  Patient has been stable and had improvement in her knee pain.  She is awaiting SNF placement at this time. -Continue allopurinol, colchicine -Rheumatology OP - continue PRN oxy IR and tylenol - TOC consulted for SNF placement   Facial cellulitis/carbuncle- afebrile, improving.  Blood cultures negative. Patient has no complaints on her face.  Lesion looks to be drying, though still swollen.  Recommending patient have shower today. -ID signed off      -Continue linezolid until 10/3 - ENT signed off - WOC s/o - pain control PRN   AKI with Hyponatremia, hyperkalemia- resolved.  Patient has maintained baseline kidney function. - strict I/O - BMP am    Insulin dependent Type 2 diabetes- Hgb A1c was 8.1 on admission. Takes trulicity at home as well as insulin. - continue sSSI - decreased home Semglee to 10units daily am for history of fasting hypoglycemia this admission -Continue atorvastatin - restarting home trulicity when able (not on formulary)   HTN- chronic, stable -Continue amlodipine, carvedilol   Sabrina Baker. fib-rate controlled -Continue Eliquis -Continue Coreg   Anxiety/depression- -Continue duloxetine   DVT prophylaxis: eliquis Code Status: (full  Family Communication: none at bedside Disposition:   Status is: Inpatient  Remains inpatient appropriate because:Inpatient level of care appropriate due to severity of illness  Dispo:  Patient From:  Home  Planned Disposition:  [place]  Medically stable for discharge:  No          Consultants:  ID, rheum, ENT  Procedures: none  Antimicrobials:  Anti-infectives (From admission, onward)    Start     Dose/Rate Route Frequency Ordered Stop   03/13/21 0000  linezolid (ZYVOX) 600 MG tablet  Status:  Discontinued       Note to Pharmacy: Stop date of 03/13/21   600 mg Oral 2 times daily 03/13/21 1005 03/16/21    03/08/21 0000  linezolid (ZYVOX) 600 MG tablet  Status:  Discontinued       Note to Pharmacy: Stop date of 03/13/21   600 mg Oral 2 times daily 03/08/21 1135 03/13/21    03/04/21 2200  cefTRIAXone (ROCEPHIN) 2 g in sodium chloride 0.9 % 100 mL IVPB  Status:  Discontinued        2 g 200 mL/hr over 30  Minutes Intravenous Every 24 hours 03/04/21 0327 03/04/21 1444   03/04/21 1600  valACYclovir (VALTREX) tablet 1,000 mg  Status:  Discontinued        1,000 mg Oral 3 times daily 03/04/21 1444 03/04/21 1446   03/04/21 1500  valACYclovir (VALTREX) tablet 1,000 mg  Status:  Discontinued        1,000 mg Oral Daily 03/04/21 1446 03/08/21 1049   03/04/21 0300  linezolid (ZYVOX) tablet 600 mg        600 mg Oral Every 12 hours 03/04/21 0245 03/13/21 2149    03/04/21 0230  cefTRIAXone (ROCEPHIN) 2 g in sodium chloride 0.9 % 100 mL IVPB        2 g 200 mL/hr over 30 Minutes Intravenous  Once 03/04/21 0218 03/04/21 0309          Subjective: C/o pain  Objective: Vitals:   03/16/21 0536 03/16/21 1050 03/16/21 1642 03/16/21 2120  BP: (!) 176/67 140/62 (!) 152/88 (!) 176/65  Pulse: 87 67 83 75  Resp: 18 16 18    Temp: 99.8 F (37.7 C) 98.6 F (37 C) 98.6 F (37 C) 98 F (36.7 C)  TempSrc: Oral Oral Oral Oral  SpO2: 92% 97% 96% 93%  Weight: 85.6 kg     Height:        Intake/Output Summary (Last 24 hours) at 03/16/2021 2126 Last data filed at 03/16/2021 1844 Gross per 24 hour  Intake 940 ml  Output 1700 ml  Net -760 ml   Filed Weights   03/11/21 0517 03/14/21 0426 03/16/21 0536  Weight: 77.8 kg 83.5 kg 85.6 kg    Examination:  General exam: Appears calm and comfortable  Respiratory system: unlabored Cardiovascular system: RRR Gastrointestinal system: Abdomen is nondistended, soft and nontender Central nervous system: moving all extremities Extremities: bilateral LE edema    Data Reviewed: I have personally reviewed following labs and imaging studies  CBC: Recent Labs  Lab 03/12/21 0846 03/13/21 0245 03/16/21 0307  WBC 10.8* 9.7 15.1*  HGB 10.0* 9.4* 10.2*  HCT 31.7* 30.1* 32.0*  MCV 98.4 99.3 100.0  PLT 304 295 267    Basic Metabolic Panel: Recent Labs  Lab 03/11/21 0211 03/11/21 1420 03/12/21 0846 03/13/21 0245 03/16/21 0307  NA 131* 130* 131* 133* 135  K 5.2* 5.1 5.0 5.1 4.9  CL 97* 96* 99 103 103  CO2 27 25 25 23 25   GLUCOSE 96 216* 151* 87 127*  BUN 20 24* 24* 19 11  CREATININE 1.51* 1.54* 1.52* 1.19* 0.85  CALCIUM 8.6* 8.3* 8.2* 8.1* 8.7*    GFR: Estimated Creatinine Clearance: 55.7 mL/min (by C-G formula based on SCr of 0.85 mg/dL).  Liver Function Tests: No results for input(s): AST, ALT, ALKPHOS, BILITOT, PROT, ALBUMIN in the last 168 hours.  CBG: Recent Labs  Lab 03/15/21 2217  03/16/21 0604 03/16/21 1154 03/16/21 1638 03/16/21 2119  GLUCAP 151* 116* 163* 111* 154*     Recent Results (from the past 240 hour(s))  Resp Panel by RT-PCR (Flu Cordarro Spinnato&B, Covid) Nasopharyngeal Swab     Status: None   Collection Time: 03/15/21  2:39 PM   Specimen: Nasopharyngeal Swab; Nasopharyngeal(NP) swabs in vial transport medium  Result Value Ref Range Status   SARS Coronavirus 2 by RT PCR NEGATIVE NEGATIVE Final    Comment: (NOTE) SARS-CoV-2 target nucleic acids are NOT DETECTED.  The SARS-CoV-2 RNA is generally detectable in upper respiratory specimens during the acute phase of infection. The lowest concentration of SARS-CoV-2 viral copies  this assay can detect is 138 copies/mL. Moira Umholtz negative result does not preclude SARS-Cov-2 infection and should not be used as the sole basis for treatment or other patient management decisions. Nakiya Rallis negative result may occur with  improper specimen collection/handling, submission of specimen other than nasopharyngeal swab, presence of viral mutation(s) within the areas targeted by this assay, and inadequate number of viral copies(<138 copies/mL). Arayla Kruschke negative result must be combined with clinical observations, patient history, and epidemiological information. The expected result is Negative.  Fact Sheet for Patients:  EntrepreneurPulse.com.au  Fact Sheet for Healthcare Providers:  IncredibleEmployment.be  This test is no t yet approved or cleared by the [place] FDA and  has been authorized for detection and/or diagnosis of SARS-CoV-2 by FDA under an Emergency Use Authorization (EUA). This EUA will remain  in effect (meaning this test can be used) for the duration of the COVID-19 declaration under Section 564(b)(1) of the Act, 21 U.S.C.section 360bbb-3(b)(1), unless the authorization is terminated  or revoked sooner.       Influenza Gurshaan Matsuoka by PCR NEGATIVE NEGATIVE Final   Influenza B by PCR NEGATIVE  NEGATIVE Final    Comment: (NOTE) The Xpert Xpress SARS-CoV-2/FLU/RSV plus assay is intended as an aid in the diagnosis of influenza from Nasopharyngeal swab specimens and should not be used as Munachimso Palin sole basis for treatment. Nasal washings and aspirates are unacceptable for Xpert Xpress SARS-CoV-2/FLU/RSV testing.  Fact Sheet for Patients: EntrepreneurPulse.com.au  Fact Sheet for Healthcare Providers: IncredibleEmployment.be  This test is not yet approved or cleared by the [place] FDA and has been authorized for detection and/or diagnosis of SARS-CoV-2 by FDA under an Emergency Use Authorization (EUA). This EUA will remain in effect (meaning this test can be used) for the duration of the COVID-19 declaration under Section 564(b)(1) of the Act, 21 U.S.C. section 360bbb-3(b)(1), unless the authorization is terminated or revoked.  Performed at [facility], [place] [address], [place], [place] [address]          Radiology Studies: DG CHEST PORT 1 VIEW  Result Date: 03/16/2021 CLINICAL DATA:  Hypoxia.  Hypertension. EXAM: PORTABLE CHEST 1 VIEW COMPARISON:  10/04/2018 FINDINGS: Low lung volumes are present, causing crowding of the pulmonary vasculature. Bilateral indistinctness of the pulmonary vasculature with mild interstitial accentuation. Mild retrocardiac opacity suspicious for left lower lobe atelectasis or pneumonia. There is also some accentuated density along the right diaphragm and right lower lobe airspace opacity in the retro diaphragmatic region cannot be excluded. Thoracolumbar scoliosis and spondylosis. Borderline enlargement of the cardiopericardial silhouette. Suspected arthropathy of the right glenohumeral joint. IMPRESSION: 1. Left basilar and potential right retro diaphragmatic airspace opacity. Lateral radiography may be helpful in further characterization if clinically feasible. 2. Chronic low lung volumes and interstitial  accentuation. 3. Borderline enlargement of the cardiopericardial silhouette. 4. Thoracolumbar scoliosis and spondylosis. 5. Right glenohumeral arthropathy. Electronically Signed   By: Van Clines M.D.   On: 03/16/2021 13:19        Scheduled Meds:  acetaminophen  650 mg Oral Q6H   allopurinol  100 mg Oral Daily   amLODipine  10 mg Oral Daily   apixaban  5 mg Oral BID   atorvastatin  10 mg Oral Daily   carvedilol  25 mg Oral BID WC   colchicine  0.3 mg Oral Daily   diclofenac Sodium  2 g Topical QID   DULoxetine  60 mg Oral Daily   gabapentin  200 mg Oral QHS   hydrocortisone  Rectal BID   insulin aspart  0-9 Units Subcutaneous TID WC   insulin glargine-yfgn  10 Units Subcutaneous Daily   latanoprost  1 drop Left Eye QHS   Continuous Infusions:   LOS: 12 days    Time spent: over 30min     Fayrene Helper, MD [facility]   To contact the attending provider between 7A-7P or the covering provider during after hours 7P-7A, please log into the web site [email or web address] and access using universal [place] password for that web site. If you do not have the password, please call the hospital operator.  03/16/2021, 9:26 PM

## 2021-03-16 NOTE — Plan of Care (Signed)

## 2021-03-16 NOTE — Progress Notes (Signed)
Occupational Therapy Treatment Patient Details Name: Sabrina Baker MRN: [id number] DOB: 1940/11/22 Today's Date: 03/16/2021   History of present illness 80 y.o. female with medical history significant for insulin-dependent type 2 diabetes, diabetic polyneuropathy, Charcot foot, CKD 3B, paroxysmal A. fib on Eliquis, hyperlipidemia, hypertension, tophaceous gout, iron deficiency anemia, anemia of chronic disease, chronic anxiety/depression, who presented to [facility] ED from home due to gradually worsening left facial cellulitis.   OT comments  Patient up in recliner and willing to participate with OT treatment. Patient was able to walk to sink with RW and min guard for safety. Patient stood at sink for grooming with min guard to supervision.  Patient returned to chair and lunch arrived and patient stated she would like to have lunch.  Acute OT to continue to follow.    Recommendations for follow up therapy are one component of a multi-disciplinary discharge planning process, led by the attending physician.  Recommendations may be updated based on patient status, additional functional criteria and insurance authorization.    Follow Up Recommendations  No OT follow up    Equipment Recommendations  None recommended by OT    Recommendations for Other Services      Precautions / Restrictions Precautions Precautions: Fall;Other (comment) Precaution Comments: watch O2 Restrictions Weight Bearing Restrictions: No       Mobility Bed Mobility               General bed mobility comments: OOB in chair    Transfers Overall transfer level: Needs assistance Equipment used: Rolling walker (2 wheeled) Transfers: Sit to/from Stand Sit to Stand: Supervision Stand pivot transfers: Min guard       General transfer comment: used RW for transfers with min guard for safety    Balance Overall balance assessment: Needs assistance Sitting-balance support: Feet supported Sitting balance-Leahy  Scale: Good     Standing balance support: No upper extremity supported;During functional activity Standing balance-Leahy Scale: Poor Standing balance comment: Patient stood at sink for grooming                           ADL either performed or assessed with clinical judgement   ADL Overall ADL's : Needs assistance/impaired     Grooming: Wash/dry hands;Wash/dry face;Oral care Grooming Details (indicate cue type and reason): able to stand to perform grooming tasks                             Functional mobility during ADLs: Min guard;Rolling walker General ADL Comments: Patient was able to stand at sink for 5-7 minutes     Vision       Perception     Praxis      Cognition Arousal/Alertness: Awake/alert Behavior During Therapy: WFL for tasks assessed/performed Overall Cognitive Status: Impaired/Different from baseline Area of Impairment: Memory                     Memory: Decreased short-term memory         General Comments: Able to follow commands        Exercises     Shoulder Instructions       General Comments      Pertinent Vitals/ Pain       Pain Assessment: Faces Faces Pain Scale: Hurts little more Pain Location: knees, buttocks Pain Descriptors / Indicators: Discomfort;Sore Pain Intervention(s): Monitored during session  Home Living  Prior Functioning/Environment              Frequency  Min 2X/week        Progress Toward Goals  OT Goals(current goals can now be found in the care plan section)  Progress towards OT goals: Progressing toward goals  Acute Rehab OT Goals Patient Stated Goal: still agreeable to go to rehab OT Goal Formulation: With patient Time For Goal Achievement: 03/24/21 Potential to Achieve Goals: Good ADL Goals Pt Will Perform Lower Body Bathing: with modified independence;sit to/from stand Pt Will Perform Upper Body  Dressing: with modified independence;sitting Pt Will Perform Lower Body Dressing: with modified independence;sit to/from stand Pt Will Transfer to Toilet: with modified independence;ambulating Pt Will Perform Toileting - Clothing Manipulation and hygiene: with modified independence Additional ADL Goal #1: Pt will independently verbalize 3 strategies to reduce risk of falls  Plan Discharge plan remains appropriate    Co-evaluation                 AM-PAC OT "6 Clicks" Daily Activity     Outcome Measure   Help from another person eating meals?: None Help from another person taking care of personal grooming?: None Help from another person toileting, which includes using toliet, bedpan, or urinal?: A Little Help from another person bathing (including washing, rinsing, drying)?: A Little Help from another person to put on and taking off regular upper body clothing?: None Help from another person to put on and taking off regular lower body clothing?: A Little 6 Click Score: 21    End of Session Equipment Utilized During Treatment: Gait belt;Rolling walker  OT Visit Diagnosis: Unsteadiness on feet (R26.81);Pain Pain - Right/Left: Right Pain - part of body: Knee;Leg   Activity Tolerance Patient tolerated treatment well   Patient Left in chair;with call bell/phone within reach;with chair alarm set   Nurse Communication Mobility status        Time: 2751-7001 OT Time Calculation (min): 18 min  Charges: OT General Charges $OT Visit: 1 Visit OT Treatments $Self Care/Home Management : 8-22 mins  [name], [place] 03/16/2021, 12:17 PM

## 2021-03-17 ENCOUNTER — Ambulatory Visit: Payer: Medicare Other

## 2021-03-17 ENCOUNTER — Inpatient Hospital Stay (HOSPITAL_COMMUNITY): Payer: Medicare Other

## 2021-03-17 DIAGNOSIS — R0902 Hypoxemia: Secondary | ICD-10-CM | POA: Diagnosis not present

## 2021-03-17 LAB — GLUCOSE, CAPILLARY
Glucose-Capillary: 109 mg/dL — ABNORMAL HIGH (ref 70–99)
Glucose-Capillary: 173 mg/dL — ABNORMAL HIGH (ref 70–99)
Glucose-Capillary: 118 mg/dL — ABNORMAL HIGH (ref 70–99)
Glucose-Capillary: 52 mg/dL — ABNORMAL LOW (ref 70–99)
Glucose-Capillary: 183 mg/dL — ABNORMAL HIGH (ref 70–99)

## 2021-03-17 LAB — COMPREHENSIVE METABOLIC PANEL
ALT: 20 U/L (ref 0–44)
AST: 22 U/L (ref 15–41)
Albumin: 2.6 g/dL — ABNORMAL LOW (ref 3.5–5.0)
Alkaline Phosphatase: 54 U/L (ref 38–126)
Anion gap: 9 (ref 5–15)
BUN: 11 mg/dL (ref 8–23)
CO2: 30 mmol/L (ref 22–32)
Calcium: 8.8 mg/dL — ABNORMAL LOW (ref 8.9–10.3)
Chloride: 98 mmol/L (ref 98–111)
Creatinine, Ser: 1.03 mg/dL — ABNORMAL HIGH (ref 0.44–1.00)
GFR, Estimated: 55 mL/min — ABNORMAL LOW (ref >60)
Glucose, Bld: 173 mg/dL — ABNORMAL HIGH (ref 70–99)
Potassium: 4.7 mmol/L (ref 3.5–5.1)
Sodium: 137 mmol/L (ref 135–145)
Total Bilirubin: 0.4 mg/dL (ref 0.3–1.2)
Total Protein: 5.7 g/dL — ABNORMAL LOW (ref 6.5–8.1)

## 2021-03-17 LAB — CBC
HCT: 32.7 % — ABNORMAL LOW (ref 36.0–46.0)
Hemoglobin: 10.4 g/dL — ABNORMAL LOW (ref 12.0–15.0)
MCH: 31.5 pg (ref 26.0–34.0)
MCHC: 31.8 g/dL (ref 30.0–36.0)
MCV: 99.1 fL (ref 80.0–100.0)
Platelets: 306 10*3/uL (ref 150–400)
RBC: 3.3 MIL/uL — ABNORMAL LOW (ref 3.87–5.11)
RDW: 14.4 % (ref 11.5–15.5)
WBC: 14.4 10*3/uL — ABNORMAL HIGH (ref 4.0–10.5)
nRBC: 0 % (ref 0.0–0.2)

## 2021-03-17 LAB — BRAIN NATRIURETIC PEPTIDE: B Natriuretic Peptide: 239.7 pg/mL — ABNORMAL HIGH (ref 0.0–100.0)

## 2021-03-17 MED ORDER — FUROSEMIDE 10 MG/ML IJ SOLN
40.0000 mg | Freq: Once | INTRAMUSCULAR | Status: AC
  Administered 2021-03-17: 40 mg via INTRAVENOUS
  Filled 2021-03-17: qty 4

## 2021-03-17 NOTE — Progress Notes (Signed)
PROGRESS NOTE    Sabrina Baker  DXI:[id number] DOB: 07-02-1940 DOA: 03/03/2021 PCP: [name], MD   No chief complaint on file.   Brief Narrative:  Sabrina Baker is Sabrina Baker 80 y.o. female with Sabrina Baker PMH significant for insulin-dependent type 2 diabetes, Charcot foot, CKD stage IIIb, Sabrina Baker. 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 They presented from home to the ED on 03/03/2021 with swelling, erythema, purulence from left cheek x 3 days. In the ED, it was found that they had cellulitis as demonstrated on maxillofacial CT. They were treated with IV antibiotics.  ENT, rheumatology and ID were also consulted. Patient was admitted to medicine service for further workup and management of cellulitis as outlined in detail below.   Assessment & Plan:   Active Problems:   Facial cellulitis   Acute idiopathic gout   Hypoxia  Acute Hypoxic Respiratory Failure Elevated BNP, bilateral LE edema CXR with L basilar and potential R retrodiagphrgmatic airspace opacitiy Repeat CXR 10/6 concerning for HF with edema and small bilateral effusions Continue lasix Wean o2 as tolerated, w/u further as indicated  Polyarthritis/acute gouty attack on right thumb and knees-patient declined home health PT and had worsening participation with PT and requested SNF placement.  Patient has been stable and had improvement in her knee pain.  She is awaiting SNF placement at this time. -Continue allopurinol, colchicine -Rheumatology OP - continue PRN oxy IR and tylenol - TOC consulted for SNF placement   Facial cellulitis/carbuncle- afebrile, improving.  Blood cultures negative. Patient has no complaints on her face.  Lesion looks to be drying, though still swollen.  Recommending patient have shower today. -ID signed off      -Continue linezolid until 10/3 - ENT signed off - WOC s/o - pain control PRN   AKI with Hyponatremia, hyperkalemia- resolved.  Patient has  maintained baseline kidney function. - strict I/O - BMP am   Insulin dependent Type 2 diabetes- Hgb A1c was 8.1 on admission. Takes trulicity at home as well as insulin. - continue sSSI - decreased home Semglee to 10units daily am for history of fasting hypoglycemia this admission -Continue atorvastatin - restarting home trulicity when able (not on formulary)   HTN- chronic, stable -Continue amlodipine, carvedilol   Derral Colucci. fib-rate controlled -Continue Eliquis -Continue Coreg   Anxiety/depression- -Continue duloxetine   DVT prophylaxis: eliquis Code Status: (full  Family Communication: none at bedside Disposition:   Status is: Inpatient  Remains inpatient appropriate because:Inpatient level of care appropriate due to severity of illness  Dispo:  Patient From:  Home  Planned Disposition:  [place]  Medically stable for discharge:  No          Consultants:  ID, rheum, ENT  Procedures: none  Antimicrobials:  Anti-infectives (From admission, onward)    Start     Dose/Rate Route Frequency Ordered Stop   03/13/21 0000  linezolid (ZYVOX) 600 MG tablet  Status:  Discontinued       Note to Pharmacy: Stop date of 03/13/21   600 mg Oral 2 times daily 03/13/21 1005 03/16/21    03/08/21 0000  linezolid (ZYVOX) 600 MG tablet  Status:  Discontinued       Note to Pharmacy: Stop date of 03/13/21   600 mg Oral 2 times daily 03/08/21 1135 03/13/21    03/04/21 2200  cefTRIAXone (ROCEPHIN) 2 g in sodium chloride 0.9 % 100 mL IVPB  Status:  Discontinued  2 g 200 mL/hr over 30 Minutes Intravenous Every 24 hours 03/04/21 0327 03/04/21 1444   03/04/21 1600  valACYclovir (VALTREX) tablet 1,000 mg  Status:  Discontinued        1,000 mg Oral 3 times daily 03/04/21 1444 03/04/21 1446   03/04/21 1500  valACYclovir (VALTREX) tablet 1,000 mg  Status:  Discontinued        1,000 mg Oral Daily 03/04/21 1446 03/08/21 1049   03/04/21 0300  linezolid (ZYVOX) tablet 600 mg         600 mg Oral Every 12 hours 03/04/21 0245 03/13/21 2149   03/04/21 0230  cefTRIAXone (ROCEPHIN) 2 g in sodium chloride 0.9 % 100 mL IVPB        2 g 200 mL/hr over 30 Minutes Intravenous  Once 03/04/21 0218 03/04/21 0309          Subjective: No complaints today, breathing feels Sabrina Baker little better  Objective: Vitals:   03/16/21 1642 03/16/21 2120 03/17/21 0520 03/17/21 0926  BP: (!) 152/88 (!) 176/65 (!) 151/63 (!) 157/64  Pulse: 83 75 81 88  Resp: 18  18 17   Temp: 98.6 F (37 C) 98 F (36.7 C) 98.8 F (37.1 C) 98.2 F (36.8 C)  TempSrc: Oral Oral Oral   SpO2: 96% 93% 94% 99%  Weight:      Height:        Intake/Output Summary (Last 24 hours) at 03/17/2021 1615 Last data filed at 03/17/2021 0800 Gross per 24 hour  Intake 780 ml  Output 2300 ml  Net -1520 ml   Filed Weights   03/11/21 0517 03/14/21 0426 03/16/21 0536  Weight: 77.8 kg 83.5 kg 85.6 kg    Examination:  General: No acute distress. Cardiovascular: RRR Lungs: Clear to auscultation bilaterally Abdomen: Soft, nontender, nondistended w Neurological: Alert and oriented 3. Moves all extremities 4 . Cranial nerves II through XII grossly intact. Skin: Warm and dry. No rashes or lesions. Extremities: bilateral LE edema, maybe slightly improved from yesterday     Data Reviewed: I have personally reviewed following labs and imaging studies  CBC: Recent Labs  Lab 03/12/21 0846 03/13/21 0245 03/16/21 0307 03/17/21 0821  WBC 10.8* 9.7 15.1* 14.4*  HGB 10.0* 9.4* 10.2* 10.4*  HCT 31.7* 30.1* 32.0* 32.7*  MCV 98.4 99.3 100.0 99.1  PLT 304 295 258 761    Basic Metabolic Panel: Recent Labs  Lab 03/11/21 1420 03/12/21 0846 03/13/21 0245 03/16/21 0307 03/17/21 0821  NA 130* 131* 133* 135 137  K 5.1 5.0 5.1 4.9 4.7  CL 96* 99 103 103 98  CO2 25 25 23 25 30   GLUCOSE 216* 151* 87 127* 173*  BUN 24* 24* 19 11 11   CREATININE 1.54* 1.52* 1.19* 0.85 1.03*  CALCIUM 8.3* 8.2* 8.1* 8.7* 8.8*     GFR: Estimated Creatinine Clearance: 45.9 mL/min (Shiori Adcox) (by C-G formula based on SCr of 1.03 mg/dL (H)).  Liver Function Tests: Recent Labs  Lab 03/17/21 0821  AST 22  ALT 20  ALKPHOS 54  BILITOT 0.4  PROT 5.7*  ALBUMIN 2.6*    CBG: Recent Labs  Lab 03/16/21 1154 03/16/21 1638 03/16/21 2119 03/17/21 0645 03/17/21 1156  GLUCAP 163* 111* 154* 109* 173*     Recent Results (from the past 240 hour(s))  Resp Panel by RT-PCR (Flu Pecola Haxton&B, Covid) Nasopharyngeal Swab     Status: None   Collection Time: 03/15/21  2:39 PM   Specimen: Nasopharyngeal Swab; Nasopharyngeal(NP) swabs in vial transport medium  Result Value Ref Range Status   SARS Coronavirus 2 by RT PCR NEGATIVE NEGATIVE Final    Comment: (NOTE) SARS-CoV-2 target nucleic acids are NOT DETECTED.  The SARS-CoV-2 RNA is generally detectable in upper respiratory specimens during the acute phase of infection. The lowest concentration of SARS-CoV-2 viral copies this assay can detect is 138 copies/mL. Hailee Hollick negative result does not preclude SARS-Cov-2 infection and should not be used as the sole basis for treatment or other patient management decisions. Kasen Sako negative result may occur with  improper specimen collection/handling, submission of specimen other than nasopharyngeal swab, presence of viral mutation(s) within the areas targeted by this assay, and inadequate number of viral copies(<138 copies/mL). Tatayana Beshears negative result must be combined with clinical observations, patient history, and epidemiological information. The expected result is Negative.  Fact Sheet for Patients:  EntrepreneurPulse.com.au  Fact Sheet for Healthcare Providers:  IncredibleEmployment.be  This test is no t yet approved or cleared by the [place] FDA and  has been authorized for detection and/or diagnosis of SARS-CoV-2 by FDA under an Emergency Use Authorization (EUA). This EUA will remain  in effect (meaning  this test can be used) for the duration of the COVID-19 declaration under Section 564(b)(1) of the Act, 21 U.S.C.section 360bbb-3(b)(1), unless the authorization is terminated  or revoked sooner.       Influenza Dollye Glasser by PCR NEGATIVE NEGATIVE Final   Influenza B by PCR NEGATIVE NEGATIVE Final    Comment: (NOTE) The Xpert Xpress SARS-CoV-2/FLU/RSV plus assay is intended as an aid in the diagnosis of influenza from Nasopharyngeal swab specimens and should not be used as Renaud Celli sole basis for treatment. Nasal washings and aspirates are unacceptable for Xpert Xpress SARS-CoV-2/FLU/RSV testing.  Fact Sheet for Patients: EntrepreneurPulse.com.au  Fact Sheet for Healthcare Providers: IncredibleEmployment.be  This test is not yet approved or cleared by the [place] FDA and has been authorized for detection and/or diagnosis of SARS-CoV-2 by FDA under an Emergency Use Authorization (EUA). This EUA will remain in effect (meaning this test can be used) for the duration of the COVID-19 declaration under Section 564(b)(1) of the Act, 21 U.S.C. section 360bbb-3(b)(1), unless the authorization is terminated or revoked.  Performed at [facility], [place] [address], [place], [place] [address]          Radiology Studies: DG CHEST PORT 1 VIEW  Result Date: 03/17/2021 CLINICAL DATA:  Hypoxia EXAM: PORTABLE CHEST 1 VIEW COMPARISON:  03/16/2021 FINDINGS: Mild cardiomegaly, stable. Atherosclerotic calcification of the aortic knob. Pulmonary vascular congestion with patchy bibasilar opacities. Small bilateral pleural effusions. No pneumothorax. IMPRESSION: Findings suggestive of CHF with pulmonary edema and small bilateral pleural effusions. Overall, little interval change from prior. Electronically Signed   By: Davina Poke D.O.   On: 03/17/2021 10:32   DG CHEST PORT 1 VIEW  Result Date: 03/16/2021 CLINICAL DATA:  Hypoxia.  Hypertension. EXAM: PORTABLE  CHEST 1 VIEW COMPARISON:  10/04/2018 FINDINGS: Low lung volumes are present, causing crowding of the pulmonary vasculature. Bilateral indistinctness of the pulmonary vasculature with mild interstitial accentuation. Mild retrocardiac opacity suspicious for left lower lobe atelectasis or pneumonia. There is also some accentuated density along the right diaphragm and right lower lobe airspace opacity in the retro diaphragmatic region cannot be excluded. Thoracolumbar scoliosis and spondylosis. Borderline enlargement of the cardiopericardial silhouette. Suspected arthropathy of the right glenohumeral joint. IMPRESSION: 1. Left basilar and potential right retro diaphragmatic airspace opacity. Lateral radiography may be helpful in further characterization if clinically feasible. 2.  Chronic low lung volumes and interstitial accentuation. 3. Borderline enlargement of the cardiopericardial silhouette. 4. Thoracolumbar scoliosis and spondylosis. 5. Right glenohumeral arthropathy. Electronically Signed   By: Van Clines M.D.   On: 03/16/2021 13:19        Scheduled Meds:  acetaminophen  650 mg Oral Q6H   allopurinol  100 mg Oral Daily   amLODipine  10 mg Oral Daily   apixaban  5 mg Oral BID   atorvastatin  10 mg Oral Daily   carvedilol  25 mg Oral BID WC   colchicine  0.3 mg Oral Daily   diclofenac Sodium  2 g Topical QID   DULoxetine  60 mg Oral Daily   furosemide  40 mg Intravenous Once   gabapentin  200 mg Oral QHS   hydrocortisone   Rectal BID   insulin aspart  0-9 Units Subcutaneous TID WC   insulin glargine-yfgn  10 Units Subcutaneous Daily   latanoprost  1 drop Left Eye QHS   Continuous Infusions:   LOS: 13 days    Time spent: over 14min     Fayrene Helper, MD [facility]   To contact the attending provider between 7A-7P or the covering provider during after hours 7P-7A, please log into the web site [email or web address] and access using universal [place] password for that  web site. If you do not have the password, please call the hospital operator.  03/17/2021, 4:15 PM

## 2021-03-18 DIAGNOSIS — R0902 Hypoxemia: Secondary | ICD-10-CM | POA: Diagnosis not present

## 2021-03-18 LAB — CBC
HCT: 27.6 % — ABNORMAL LOW (ref 36.0–46.0)
Hemoglobin: 8.7 g/dL — ABNORMAL LOW (ref 12.0–15.0)
MCH: 31.4 pg (ref 26.0–34.0)
MCHC: 31.5 g/dL (ref 30.0–36.0)
MCV: 99.6 fL (ref 80.0–100.0)
Platelets: 261 10*3/uL (ref 150–400)
RBC: 2.77 MIL/uL — ABNORMAL LOW (ref 3.87–5.11)
RDW: 14.3 % (ref 11.5–15.5)
WBC: 13.8 10*3/uL — ABNORMAL HIGH (ref 4.0–10.5)
nRBC: 0 % (ref 0.0–0.2)

## 2021-03-18 LAB — COMPREHENSIVE METABOLIC PANEL
ALT: 15 U/L (ref 0–44)
AST: 18 U/L (ref 15–41)
Albumin: 2.4 g/dL — ABNORMAL LOW (ref 3.5–5.0)
Alkaline Phosphatase: 51 U/L (ref 38–126)
Anion gap: 6 (ref 5–15)
BUN: 16 mg/dL (ref 8–23)
CO2: 31 mmol/L (ref 22–32)
Calcium: 8.2 mg/dL — ABNORMAL LOW (ref 8.9–10.3)
Chloride: 98 mmol/L (ref 98–111)
Creatinine, Ser: 1.16 mg/dL — ABNORMAL HIGH (ref 0.44–1.00)
GFR, Estimated: 48 mL/min — ABNORMAL LOW (ref >60)
Glucose, Bld: 196 mg/dL — ABNORMAL HIGH (ref 70–99)
Potassium: 4.4 mmol/L (ref 3.5–5.1)
Sodium: 135 mmol/L (ref 135–145)
Total Bilirubin: 0.4 mg/dL (ref 0.3–1.2)
Total Protein: 5.1 g/dL — ABNORMAL LOW (ref 6.5–8.1)

## 2021-03-18 LAB — GLUCOSE, CAPILLARY
Glucose-Capillary: 162 mg/dL — ABNORMAL HIGH (ref 70–99)
Glucose-Capillary: 167 mg/dL — ABNORMAL HIGH (ref 70–99)
Glucose-Capillary: 180 mg/dL — ABNORMAL HIGH (ref 70–99)
Glucose-Capillary: 197 mg/dL — ABNORMAL HIGH (ref 70–99)

## 2021-03-18 MED ORDER — FUROSEMIDE 10 MG/ML IJ SOLN
40.0000 mg | Freq: Two times a day (BID) | INTRAMUSCULAR | Status: DC
  Administered 2021-03-19 – 2021-03-20 (×4): 40 mg via INTRAVENOUS
  Filled 2021-03-18 (×4): qty 4

## 2021-03-18 MED ORDER — FUROSEMIDE 10 MG/ML IJ SOLN
40.0000 mg | Freq: Once | INTRAMUSCULAR | Status: AC
  Administered 2021-03-18: 40 mg via INTRAVENOUS
  Filled 2021-03-18: qty 4

## 2021-03-18 MED ORDER — ACETAMINOPHEN 325 MG PO TABS
650.0000 mg | ORAL_TABLET | Freq: Four times a day (QID) | ORAL | Status: DC
  Administered 2021-03-18 – 2021-03-23 (×19): 650 mg via ORAL
  Filled 2021-03-18 (×19): qty 2

## 2021-03-18 NOTE — Progress Notes (Signed)
Patient sitting on bedside commode with no oxygen on, checked the O2 sats on RA 71% told her to take deep breaths went upto 78-80% but pt was asymptomatic.  Put her back on 3 L O2 after couple of seconds O2 sat went up to 87-94%. MD made aware. Reinforced and educated patient not to remove [place] and also not to use the bathroom by herself, verbalized understanding. Will continue to monitor.

## 2021-03-18 NOTE — TOC Progression Note (Addendum)
Transition of Care [facility]) - Progression Note    Patient Details  Name: Sabrina Baker MRN: [id number] Date of Birth: 04/03/1941  [facility] CM/SW Contact  [name], LCSW Phone Number: 03/18/2021, 1:53 PM  Clinical Narrative:  Patient requested to see CSW. When CSW arrived at the room patient was on the phone and once she ended the call, she appeared upset. When asked, [name] responded that she was stressed out with things with her son re: paying bills, etc., and she was on the phone with her ex-husband about this. CSW responded empathetically. [name] asked about [place] and was informed that CSW will follow-up with Elyse Hsu.   2:40 pm: Contacted Elyse Hsu, admissions director at [facility] regarding patient and informed her that patient now on oxygen and not sure when she will be ready for discharge. Asked about bed availability next week and Elyse Hsu reported that they may have a bed on Monday. Elyse Hsu advised that she will be contacted once CSW is advised that she is ready for discharge.   Expected Discharge Plan: Home/Self Care Barriers to Discharge: No Barriers Identified  Expected Discharge Plan and Services Expected Discharge Plan: Home/Self Care   Discharge Planning Services: CM Consult, NA Post Acute Care Choice: NA Living arrangements for the past 2 months: Single Family Home Expected Discharge Date: 03/13/21               DME Arranged: N/A DME Agency: NA       HH Arranged: NA HH Agency: NA         Social Determinants of Health (SDOH) Interventions    Readmission Risk Interventions No flowsheet data found.

## 2021-03-18 NOTE — Progress Notes (Signed)
PROGRESS NOTE    Sabrina Baker  UJW:[id number] DOB: 01/15/1941 DOA: 03/03/2021 PCP: [name], MD   No chief complaint on file.   Brief Narrative:  Sabrina Baker is Sabrina Baker 80 y.o. female with Sabrina Baker PMH significant for insulin-dependent type 2 diabetes, Charcot foot, CKD stage IIIb, Arshia Rondon. 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 They presented from home to the ED on 03/03/2021 with swelling, erythema, purulence from left cheek x 3 days. In the ED, it was found that they had cellulitis as demonstrated on maxillofacial CT. They were treated with IV antibiotics.  ENT, rheumatology and ID were also consulted. Patient was admitted to medicine service for further workup and management of cellulitis as outlined in detail below.   Assessment & Plan:   Active Problems:   Facial cellulitis   Acute idiopathic gout   Hypoxia  Acute Hypoxic Respiratory Failure Elevated BNP, bilateral LE edema -> seems to be improving CXR with L basilar and potential R retrodiagphrgmatic airspace opacitiy Repeat CXR 10/6 concerning for HF with edema and small bilateral effusions Continue lasix as tolerated Wean o2 as tolerated, w/u further as indicated (on 2 L today, discussed wean with RN)  Polyarthritis/acute gouty attack on right thumb and knees-patient declined home health PT and had worsening participation with PT and requested SNF placement.  Patient has been stable and had improvement in her knee pain.  She is awaiting SNF placement at this time. -Continue allopurinol, colchicine -Rheumatology OP - continue PRN oxy IR and tylenol - TOC consulted for SNF placement   Facial cellulitis/carbuncle- afebrile, improving.  Blood cultures negative. Patient has no complaints on her face.  Lesion looks to be drying, though still swollen.   -ID signed off      -Continue linezolid until 10/3 - ENT signed off - WOC s/o - pain control PRN   AKI with Hyponatremia,  hyperkalemia- resolved.  Patient has maintained baseline kidney function. - strict I/O - BMP am   Insulin dependent Type 2 diabetes- Hgb A1c was 8.1 on admission. Takes trulicity at home as well as insulin. - continue sSSI - decreased home Semglee to 10units daily am for history of fasting hypoglycemia this admission -Continue atorvastatin - restarting home trulicity when able (not on formulary)   HTN- chronic, stable -Continue amlodipine, carvedilol   Tandy Grawe. fib-rate controlled -Continue Eliquis -Continue Coreg   Anxiety/depression- -Continue duloxetine   DVT prophylaxis: eliquis Code Status: (full  Family Communication: none at bedside Disposition:   Status is: Inpatient  Remains inpatient appropriate because:Inpatient level of care appropriate due to severity of illness  Dispo:  Patient From:  Home  Planned Disposition:  [place]  Medically stable for discharge:  No          Consultants:  ID, rheum, ENT  Procedures: none  Antimicrobials:  Anti-infectives (From admission, onward)    Start     Dose/Rate Route Frequency Ordered Stop   03/13/21 0000  linezolid (ZYVOX) 600 MG tablet  Status:  Discontinued       Note to Pharmacy: Stop date of 03/13/21   600 mg Oral 2 times daily 03/13/21 1005 03/16/21    03/08/21 0000  linezolid (ZYVOX) 600 MG tablet  Status:  Discontinued       Note to Pharmacy: Stop date of 03/13/21   600 mg Oral 2 times daily 03/08/21 1135 03/13/21    03/04/21 2200  cefTRIAXone (ROCEPHIN) 2 g in sodium chloride 0.9 %  100 mL IVPB  Status:  Discontinued        2 g 200 mL/hr over 30 Minutes Intravenous Every 24 hours 03/04/21 0327 03/04/21 1444   03/04/21 1600  valACYclovir (VALTREX) tablet 1,000 mg  Status:  Discontinued        1,000 mg Oral 3 times daily 03/04/21 1444 03/04/21 1446   03/04/21 1500  valACYclovir (VALTREX) tablet 1,000 mg  Status:  Discontinued        1,000 mg Oral Daily 03/04/21 1446 03/08/21 1049   03/04/21  0300  linezolid (ZYVOX) tablet 600 mg        600 mg Oral Every 12 hours 03/04/21 0245 03/13/21 2149   03/04/21 0230  cefTRIAXone (ROCEPHIN) 2 g in sodium chloride 0.9 % 100 mL IVPB        2 g 200 mL/hr over 30 Minutes Intravenous  Once 03/04/21 0218 03/04/21 0309          Subjective: Doesn't feel well in general today  Objective: Vitals:   03/17/21 0926 03/17/21 2052 03/18/21 0429 03/18/21 0521  BP: (!) 157/64 125/68  (!) 147/66  Pulse: 88 72  78  Resp: 17 18  18   Temp: 98.2 F (36.8 C) 98.7 F (37.1 C)  98.6 F (37 C)  TempSrc:  Oral  Oral  SpO2: 99% 94%  91%  Weight:   79.1 kg   Height:        Intake/Output Summary (Last 24 hours) at 03/18/2021 1228 Last data filed at 03/17/2021 1700 Gross per 24 hour  Intake 600 ml  Output --  Net 600 ml   Filed Weights   03/14/21 0426 03/16/21 0536 03/18/21 0429  Weight: 83.5 kg 85.6 kg 79.1 kg    Examination:  General: No acute distress. Cardiovascular: RRR Lungs: Clear to auscultation bilaterally Abdomen: Soft, nontender, nondistended w Neurological: Alert and oriented 3. Moves all extremities 4 . Cranial nerves II through XII grossly intact. Skin: Warm and dry. No rashes or lesions. Extremities: bilateral LE edema, maybe slightly improved from yesterday     Data Reviewed: I have personally reviewed following labs and imaging studies  CBC: Recent Labs  Lab 03/12/21 0846 03/13/21 0245 03/16/21 0307 03/17/21 0821 03/18/21 0931  WBC 10.8* 9.7 15.1* 14.4* 13.8*  HGB 10.0* 9.4* 10.2* 10.4* 8.7*  HCT 31.7* 30.1* 32.0* 32.7* 27.6*  MCV 98.4 99.3 100.0 99.1 99.6  PLT 304 295 258 306 993    Basic Metabolic Panel: Recent Labs  Lab 03/12/21 0846 03/13/21 0245 03/16/21 0307 03/17/21 0821 03/18/21 0931  NA 131* 133* 135 137 135  K 5.0 5.1 4.9 4.7 4.4  CL 99 103 103 98 98  CO2 25 23 25 30 31   GLUCOSE 151* 87 127* 173* 196*  BUN 24* 19 11 11 16   CREATININE 1.52* 1.19* 0.85 1.03* 1.16*  CALCIUM 8.2* 8.1*  8.7* 8.8* 8.2*    GFR: Estimated Creatinine Clearance: 39.2 mL/min (Sabrina Baker) (by C-G formula based on SCr of 1.16 mg/dL (H)).  Liver Function Tests: Recent Labs  Lab 03/17/21 0821 03/18/21 0931  AST 22 18  ALT 20 15  ALKPHOS 54 51  BILITOT 0.4 0.4  PROT 5.7* 5.1*  ALBUMIN 2.6* 2.4*    CBG: Recent Labs  Lab 03/17/21 1650 03/17/21 1724 03/17/21 2054 03/18/21 0546 03/18/21 1124  GLUCAP 52* 118* 183* 162* 167*     Recent Results (from the past 240 hour(s))  Resp Panel by RT-PCR (Flu Sabrina Baker&B, Covid) Nasopharyngeal Swab  Status: None   Collection Time: 03/15/21  2:39 PM   Specimen: Nasopharyngeal Swab; Nasopharyngeal(NP) swabs in vial transport medium  Result Value Ref Range Status   SARS Coronavirus 2 by RT PCR NEGATIVE NEGATIVE Final    Comment: (NOTE) SARS-CoV-2 target nucleic acids are NOT DETECTED.  The SARS-CoV-2 RNA is generally detectable in upper respiratory specimens during the acute phase of infection. The lowest concentration of SARS-CoV-2 viral copies this assay can detect is 138 copies/mL. Sabrina Baker negative result does not preclude SARS-Cov-2 infection and should not be used as the sole basis for treatment or other patient management decisions. Sabrina Baker negative result may occur with  improper specimen collection/handling, submission of specimen other than nasopharyngeal swab, presence of viral mutation(s) within the areas targeted by this assay, and inadequate number of viral copies(<138 copies/mL). Sabrina Baker negative result must be combined with clinical observations, patient history, and epidemiological information. The expected result is Negative.  Fact Sheet for Patients:  EntrepreneurPulse.com.au  Fact Sheet for Healthcare Providers:  IncredibleEmployment.be  This test is no t yet approved or cleared by the [place] FDA and  has been authorized for detection and/or diagnosis of SARS-CoV-2 by FDA under an Emergency Use Authorization  (EUA). This EUA will remain  in effect (meaning this test can be used) for the duration of the COVID-19 declaration under Section 564(b)(1) of the Act, 21 U.S.C.section 360bbb-3(b)(1), unless the authorization is terminated  or revoked sooner.       Influenza Sabrina Baker by PCR NEGATIVE NEGATIVE Final   Influenza B by PCR NEGATIVE NEGATIVE Final    Comment: (NOTE) The Xpert Xpress SARS-CoV-2/FLU/RSV plus assay is intended as an aid in the diagnosis of influenza from Nasopharyngeal swab specimens and should not be used as Sabrina Baker sole basis for treatment. Nasal washings and aspirates are unacceptable for Xpert Xpress SARS-CoV-2/FLU/RSV testing.  Fact Sheet for Patients: EntrepreneurPulse.com.au  Fact Sheet for Healthcare Providers: IncredibleEmployment.be  This test is not yet approved or cleared by the [place] FDA and has been authorized for detection and/or diagnosis of SARS-CoV-2 by FDA under an Emergency Use Authorization (EUA). This EUA will remain in effect (meaning this test can be used) for the duration of the COVID-19 declaration under Section 564(b)(1) of the Act, 21 U.S.C. section 360bbb-3(b)(1), unless the authorization is terminated or revoked.  Performed at [facility], [place] [address], [place] [employer], [place] [address]          Radiology Studies: DG CHEST PORT 1 VIEW  Result Date: 03/17/2021 CLINICAL DATA:  Hypoxia EXAM: PORTABLE CHEST 1 VIEW COMPARISON:  03/16/2021 FINDINGS: Mild cardiomegaly, stable. Atherosclerotic calcification of the aortic knob. Pulmonary vascular congestion with patchy bibasilar opacities. Small bilateral pleural effusions. No pneumothorax. IMPRESSION: Findings suggestive of CHF with pulmonary edema and small bilateral pleural effusions. Overall, little interval change from prior. Electronically Signed   By: Sabrina Baker D.O.   On: 03/17/2021 10:32        Scheduled Meds:  acetaminophen  650 mg Oral Q6H    allopurinol  100 mg Oral Daily   amLODipine  10 mg Oral Daily   apixaban  5 mg Oral BID   atorvastatin  10 mg Oral Daily   carvedilol  25 mg Oral BID WC   colchicine  0.3 mg Oral Daily   diclofenac Sodium  2 g Topical QID   DULoxetine  60 mg Oral Daily   furosemide  40 mg Intravenous Once   gabapentin  200 mg Oral QHS  hydrocortisone   Rectal BID   insulin aspart  0-9 Units Subcutaneous TID WC   insulin glargine-yfgn  10 Units Subcutaneous Daily   latanoprost  1 drop Left Eye QHS   Continuous Infusions:   LOS: 14 days    Time spent: over 31min     Fayrene Helper, MD [facility]   To contact the attending provider between 7A-7P or the covering provider during after hours 7P-7A, please log into the web site [email or web address] and access using universal [place] password for that web site. If you do not have the password, please call the hospital operator.  03/18/2021, 12:28 PM

## 2021-03-19 ENCOUNTER — Inpatient Hospital Stay (HOSPITAL_COMMUNITY): Payer: Medicare Other

## 2021-03-19 DIAGNOSIS — I509 Heart failure, unspecified: Secondary | ICD-10-CM | POA: Diagnosis not present

## 2021-03-19 LAB — COMPREHENSIVE METABOLIC PANEL
ALT: 15 U/L (ref 0–44)
AST: 17 U/L (ref 15–41)
Albumin: 2.7 g/dL — ABNORMAL LOW (ref 3.5–5.0)
Alkaline Phosphatase: 49 U/L (ref 38–126)
Anion gap: 6 (ref 5–15)
BUN: 14 mg/dL (ref 8–23)
CO2: 33 mmol/L — ABNORMAL HIGH (ref 22–32)
Calcium: 8.3 mg/dL — ABNORMAL LOW (ref 8.9–10.3)
Chloride: 97 mmol/L — ABNORMAL LOW (ref 98–111)
Creatinine, Ser: 1.03 mg/dL — ABNORMAL HIGH (ref 0.44–1.00)
GFR, Estimated: 55 mL/min — ABNORMAL LOW (ref >60)
Glucose, Bld: 116 mg/dL — ABNORMAL HIGH (ref 70–99)
Potassium: 3.8 mmol/L (ref 3.5–5.1)
Sodium: 136 mmol/L (ref 135–145)
Total Bilirubin: 0.5 mg/dL (ref 0.3–1.2)
Total Protein: 5.6 g/dL — ABNORMAL LOW (ref 6.5–8.1)

## 2021-03-19 LAB — CBC WITH DIFFERENTIAL/PLATELET
Abs Immature Granulocytes: 0.1 10*3/uL — ABNORMAL HIGH (ref 0.00–0.07)
Basophils Absolute: 0.1 10*3/uL (ref 0.0–0.1)
Basophils Relative: 1 %
Eosinophils Absolute: 0.5 10*3/uL (ref 0.0–0.5)
Eosinophils Relative: 4 %
HCT: 27.8 % — ABNORMAL LOW (ref 36.0–46.0)
Hemoglobin: 8.8 g/dL — ABNORMAL LOW (ref 12.0–15.0)
Immature Granulocytes: 1 %
Lymphocytes Relative: 13 %
Lymphs Abs: 1.8 10*3/uL (ref 0.7–4.0)
MCH: 31.1 pg (ref 26.0–34.0)
MCHC: 31.7 g/dL (ref 30.0–36.0)
MCV: 98.2 fL (ref 80.0–100.0)
Monocytes Absolute: 1.7 10*3/uL — ABNORMAL HIGH (ref 0.1–1.0)
Monocytes Relative: 13 %
Neutro Abs: 9.4 10*3/uL — ABNORMAL HIGH (ref 1.7–7.7)
Neutrophils Relative %: 68 %
Platelets: 295 10*3/uL (ref 150–400)
RBC: 2.83 MIL/uL — ABNORMAL LOW (ref 3.87–5.11)
RDW: 14.3 % (ref 11.5–15.5)
WBC: 13.5 10*3/uL — ABNORMAL HIGH (ref 4.0–10.5)
nRBC: 0 % (ref 0.0–0.2)

## 2021-03-19 LAB — ECHOCARDIOGRAM COMPLETE
AV Mean grad: 12 mmHg
AV Peak grad: 21.7 mmHg
Ao pk vel: 2.33 m/s
Area-P 1/2: 3.72 cm2
Height: 63 in
S' Lateral: 2.7 cm
Weight: 2783.09 oz

## 2021-03-19 LAB — GLUCOSE, CAPILLARY
Glucose-Capillary: 103 mg/dL — ABNORMAL HIGH (ref 70–99)
Glucose-Capillary: 228 mg/dL — ABNORMAL HIGH (ref 70–99)
Glucose-Capillary: 126 mg/dL — ABNORMAL HIGH (ref 70–99)
Glucose-Capillary: 258 mg/dL — ABNORMAL HIGH (ref 70–99)

## 2021-03-19 LAB — PHOSPHORUS: Phosphorus: 3.3 mg/dL (ref 2.5–4.6)

## 2021-03-19 LAB — MAGNESIUM: Magnesium: 1.4 mg/dL — ABNORMAL LOW (ref 1.7–2.4)

## 2021-03-19 LAB — BRAIN NATRIURETIC PEPTIDE: B Natriuretic Peptide: 238.7 pg/mL — ABNORMAL HIGH (ref 0.0–100.0)

## 2021-03-19 MED ORDER — POTASSIUM CHLORIDE CRYS ER 20 MEQ PO TBCR
40.0000 meq | EXTENDED_RELEASE_TABLET | Freq: Once | ORAL | Status: AC
  Administered 2021-03-19: 40 meq via ORAL
  Filled 2021-03-19: qty 2

## 2021-03-19 MED ORDER — MAGNESIUM SULFATE 2 GM/50ML IV SOLN
2.0000 g | Freq: Once | INTRAVENOUS | Status: AC
  Administered 2021-03-19: 2 g via INTRAVENOUS
  Filled 2021-03-19: qty 50

## 2021-03-19 NOTE — Progress Notes (Signed)
  Echocardiogram 2D Echocardiogram has been performed.  Sabrina Baker 03/19/2021, 3:02 PM

## 2021-03-19 NOTE — Plan of Care (Signed)
  Problem: Education: Goal: Knowledge of General Education information will improve Description: Including pain rating scale, medication(s)/side effects and non-pharmacologic comfort measures Outcome: Progressing   Problem: Health Behavior/Discharge Planning: Goal: Ability to manage health-related needs will improve Outcome: Progressing   Problem: Clinical Measurements: Goal: Will remain free from infection Outcome: Progressing   Problem: Activity: Goal: Risk for activity intolerance will decrease Outcome: Progressing   Problem: Elimination: Goal: Will not experience complications related to urinary retention Outcome: Progressing

## 2021-03-19 NOTE — Progress Notes (Signed)
PROGRESS NOTE    Sabrina Baker  VXY:[id number] DOB: June 17, 1940 DOA: 03/03/2021 PCP: [name], MD   No chief complaint on file.   Brief Narrative:  KAMAYA Baker is Sabrina Baker 80 y.o. female with Sabrina Baker PMH significant for insulin-dependent type 2 diabetes, Charcot foot, CKD stage IIIb, Quintez Maselli. 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 They presented from home to the ED on 03/03/2021 with swelling, erythema, purulence from left cheek x 3 days. In the ED, it was found that they had cellulitis as demonstrated on maxillofacial CT. They were treated with IV antibiotics.  ENT, rheumatology and ID were also consulted. Patient was admitted to medicine service for further workup and management of cellulitis as outlined in detail below.   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  Patient has been stable and had improvement in her knee pain.  She is awaiting SNF placement at this time. -Continue allopurinol, colchicine -Rheumatology OP - continue PRN oxy IR and tylenol - TOC consulted for SNF placement   Facial cellulitis/carbuncle- afebrile, improving.  Blood cultures negative. Patient has no complaints on her face.  Lesion looks to be drying, though still swollen.   -ID signed off      -Continue linezolid until 10/3 - ENT signed off - WOC s/o - pain control PRN    AKI with Hyponatremia, hyperkalemia- resolved.  Patient has maintained baseline kidney function. - strict I/O - BMP am   Insulin dependent Type 2 diabetes- Hgb A1c was 8.1 on admission. Takes trulicity at home as well as insulin. - continue sSSI - decreased home Semglee to 10units daily am for history of fasting hypoglycemia this admission -Continue atorvastatin - restarting home trulicity when able (not on formulary)   HTN- chronic, stable -Continue amlodipine, carvedilol   Sabrina Baker. fib-rate controlled -Continue Eliquis -Continue Coreg   Anxiety/depression- -Continue duloxetine   DVT prophylaxis: eliquis Code Status: (full  Family Communication: none at bedside Disposition:   Status is: Inpatient  Remains inpatient appropriate because:Inpatient level of care appropriate due to severity of illness  Dispo:  Patient From:     Planned Disposition:  [place]  Medically stable for discharge:             Consultants:  ID, rheum, ENT  Procedures: none  Antimicrobials:  Anti-infectives (From admission, onward)    Start     Dose/Rate Route Frequency Ordered Stop   03/13/21 0000  linezolid (ZYVOX) 600 MG tablet  Status:  Discontinued       Note to Pharmacy: Stop date of 03/13/21   600 mg Oral 2 times daily 03/13/21 1005 03/16/21    03/08/21 0000  linezolid (ZYVOX) 600 MG tablet  Status:  Discontinued       Note to Pharmacy: Stop date of 03/13/21   600 mg Oral 2 times daily 03/08/21 1135 03/13/21    03/04/21 2200  cefTRIAXone (ROCEPHIN) 2 g  in sodium chloride 0.9 % 100 mL IVPB  Status:  Discontinued        2 g 200 mL/hr over 30 Minutes Intravenous Every 24 hours 03/04/21 0327 03/04/21 1444   03/04/21 1600  valACYclovir (VALTREX) tablet 1,000 mg  Status:  Discontinued        1,000 mg Oral 3 times daily 03/04/21 1444 03/04/21 1446   03/04/21 1500  valACYclovir (VALTREX) tablet 1,000 mg  Status:  Discontinued        1,000 mg Oral Daily 03/04/21 1446  03/08/21 1049   03/04/21 0300  linezolid (ZYVOX) tablet 600 mg        600 mg Oral Every 12 hours 03/04/21 0245 03/13/21 2149   03/04/21 0230  cefTRIAXone (ROCEPHIN) 2 g in sodium chloride 0.9 % 100 mL IVPB        2 g 200 mL/hr over 30 Minutes Intravenous  Once 03/04/21 0218 03/04/21 0309          Subjective: Feels better today  Objective: Vitals:   03/18/21 2137 03/18/21 2143 03/19/21 0500 03/19/21 0922  BP: (!) 147/66 (!) 146/66 (!) 172/71 (!) 154/62  Pulse: 68 68 77 76  Resp: 18 18 18 16   Temp: 98.2 F (36.8 C) 98.2 F (36.8 C) 97.9 F (36.6 C) 98.1 F (36.7 C)  TempSrc: Oral Oral Oral Oral  SpO2: 95% 95% 93% 95%  Weight:  78.9 kg    Height:        Intake/Output Summary (Last 24 hours) at 03/19/2021 1519 Last data filed at 03/19/2021 1200 Gross per 24 hour  Intake 530.07 ml  Output 600 ml  Net -69.93 ml   Filed Weights   03/16/21 0536 03/18/21 0429 03/18/21 2143  Weight: 85.6 kg 79.1 kg 78.9 kg    Examination:  General: No acute distress. Cardiovascular: RRR Lungs: Clear to auscultation bilaterally  Abdomen: Soft, nontender, nondistended  Neurological: Alert and oriented 3. Moves all extremities 4 . Cranial nerves II through XII grossly intact. Skin: Warm and dry. No rashes or lesions. Extremities: LE edema improving     Data Reviewed: I have personally reviewed following labs and imaging studies  CBC: Recent Labs  Lab 03/13/21 0245 03/16/21 0307 03/17/21 0821 03/18/21 0931 03/19/21 0256  WBC 9.7 15.1* 14.4* 13.8* 13.5*  NEUTROABS  --   --   --   --  9.4*  HGB 9.4* 10.2* 10.4* 8.7* 8.8*  HCT 30.1* 32.0* 32.7* 27.6* 27.8*  MCV 99.3 100.0 99.1 99.6 98.2  PLT 295 258 306 261 811    Basic Metabolic Panel: Recent Labs  Lab 03/13/21 0245 03/16/21 0307 03/17/21 0821 03/18/21 0931 03/19/21 0256  NA 133* 135 137 135 136  K 5.1 4.9 4.7 4.4 3.8  CL 103 103 98 98 97*  CO2 23 25 30 31  33*  GLUCOSE 87 127* 173* 196* 116*  BUN 19 11 11 16 14    CREATININE 1.19* 0.85 1.03* 1.16* 1.03*  CALCIUM 8.1* 8.7* 8.8* 8.2* 8.3*  MG  --   --   --   --  1.4*  PHOS  --   --   --   --  3.3    GFR: Estimated Creatinine Clearance: 44 mL/min (Nilay Mangrum) (by C-G formula based on SCr of 1.03 mg/dL (H)).  Liver Function Tests: Recent Labs  Lab 03/17/21 0821 03/18/21 0931 03/19/21 0256  AST 22 18 17   ALT 20 15 15   ALKPHOS 54 51 49  BILITOT 0.4 0.4 0.5  PROT 5.7*  5.1* 5.6*  ALBUMIN 2.6* 2.4* 2.7*    CBG: Recent Labs  Lab 03/18/21 1124 03/18/21 1643 03/18/21 2139 03/19/21 0627 03/19/21 1133  GLUCAP 167* 180* 197* 103* 228*     Recent Results (from the past 240 hour(s))  Resp Panel by RT-PCR (Flu Jarreau Callanan&B, Covid) Nasopharyngeal Swab     Status: None   Collection Time: 03/15/21  2:39 PM   Specimen: Nasopharyngeal Swab; Nasopharyngeal(NP) swabs in vial transport medium  Result Value Ref Range Status   SARS Coronavirus 2 by RT PCR NEGATIVE NEGATIVE Final    Comment: (NOTE) SARS-CoV-2 target nucleic acids are NOT DETECTED.  The SARS-CoV-2 RNA is generally detectable in upper respiratory specimens during the acute phase of infection. The lowest concentration of SARS-CoV-2 viral copies this assay can detect is 138 copies/mL. Hooper Petteway negative result does not preclude SARS-Cov-2 infection and should not be used as the sole basis for treatment or other patient management decisions. Fenris Cauble negative result may occur with  improper specimen collection/handling, submission of specimen other than nasopharyngeal swab, presence of viral mutation(s) within the areas targeted by this assay, and inadequate number of viral copies(<138 copies/mL). Aliya Sol negative result must be combined with clinical observations, patient history, and epidemiological information. The expected result is Negative.  Fact Sheet for Patients:  EntrepreneurPulse.com.au  Fact Sheet for Healthcare Providers:  IncredibleEmployment.be  This test is no t yet  approved or cleared by the [place] FDA and  has been authorized for detection and/or diagnosis of SARS-CoV-2 by FDA under an Emergency Use Authorization (EUA). This EUA will remain  in effect (meaning this test can be used) for the duration of the COVID-19 declaration under Section 564(b)(1) of the Act, 21 U.S.C.section 360bbb-3(b)(1), unless the authorization is terminated  or revoked sooner.       Influenza Hayde Kilgour by PCR NEGATIVE NEGATIVE Final   Influenza B by PCR NEGATIVE NEGATIVE Final    Comment: (NOTE) The Xpert Xpress SARS-CoV-2/FLU/RSV plus assay is intended as an aid in the diagnosis of influenza from Nasopharyngeal swab specimens and should not be used as Taneika Choi sole basis for treatment. Nasal washings and aspirates are unacceptable for Xpert Xpress SARS-CoV-2/FLU/RSV testing.  Fact Sheet for Patients: EntrepreneurPulse.com.au  Fact Sheet for Healthcare Providers: IncredibleEmployment.be  This test is not yet approved or cleared by the [place] FDA and has been authorized for detection and/or diagnosis of SARS-CoV-2 by FDA under an Emergency Use Authorization (EUA). This EUA will remain in effect (meaning this test can be used) for the duration of the COVID-19 declaration under Section 564(b)(1) of the Act, 21 U.S.C. section 360bbb-3(b)(1), unless the authorization is terminated or revoked.  Performed at [facility], [place] [address] [place], [place] [address]          Radiology Studies: No results found.      Scheduled Meds:  acetaminophen  650 mg Oral Q6H   allopurinol  100 mg Oral Daily   amLODipine  10 mg Oral Daily   apixaban  5 mg Oral BID   atorvastatin  10 mg Oral Daily   carvedilol  25 mg Oral BID WC   colchicine  0.3 mg Oral Daily   diclofenac Sodium  2 g Topical QID   DULoxetine  60 mg Oral Daily   furosemide  40 mg Intravenous BID   gabapentin  200 mg Oral QHS   hydrocortisone   Rectal BID    insulin aspart  0-9 Units Subcutaneous TID WC   insulin glargine-yfgn  10 Units Subcutaneous Daily   latanoprost  1 drop Left Eye QHS   Continuous Infusions:   LOS: 15 days    Time spent: over 59min     Fayrene Helper, MD [facility]   To contact the attending provider between 7A-7P or the covering provider during after hours 7P-7A, please log into the web site [email or web address] and access using universal [place] password for that web site. If you do not have the password, please call the hospital operator.  03/19/2021, 3:19 PM

## 2021-03-19 NOTE — Plan of Care (Signed)
  Problem: Health Behavior/Discharge Planning: Goal: Ability to manage health-related needs will improve Outcome: Progressing   Problem: Activity: Goal: Risk for activity intolerance will decrease Outcome: Progressing   Problem: Coping: Goal: Level of anxiety will decrease Outcome: Progressing   

## 2021-03-20 ENCOUNTER — Inpatient Hospital Stay (HOSPITAL_COMMUNITY): Payer: Medicare Other

## 2021-03-20 LAB — COMPREHENSIVE METABOLIC PANEL
ALT: 18 U/L (ref 0–44)
AST: 21 U/L (ref 15–41)
Albumin: 2.8 g/dL — ABNORMAL LOW (ref 3.5–5.0)
Alkaline Phosphatase: 55 U/L (ref 38–126)
Anion gap: 7 (ref 5–15)
BUN: 10 mg/dL (ref 8–23)
CO2: 35 mmol/L — ABNORMAL HIGH (ref 22–32)
Calcium: 8.3 mg/dL — ABNORMAL LOW (ref 8.9–10.3)
Chloride: 95 mmol/L — ABNORMAL LOW (ref 98–111)
Creatinine, Ser: 0.92 mg/dL (ref 0.44–1.00)
GFR, Estimated: >60 mL/min (ref >60)
Glucose, Bld: 172 mg/dL — ABNORMAL HIGH (ref 70–99)
Potassium: 4.1 mmol/L (ref 3.5–5.1)
Sodium: 137 mmol/L (ref 135–145)
Total Bilirubin: 0.8 mg/dL (ref 0.3–1.2)
Total Protein: 5.6 g/dL — ABNORMAL LOW (ref 6.5–8.1)

## 2021-03-20 LAB — CBC WITH DIFFERENTIAL/PLATELET
Abs Immature Granulocytes: 0.22 10*3/uL — ABNORMAL HIGH (ref 0.00–0.07)
Basophils Absolute: 0.1 10*3/uL (ref 0.0–0.1)
Basophils Relative: 1 %
Eosinophils Absolute: 0.4 10*3/uL (ref 0.0–0.5)
Eosinophils Relative: 3 %
HCT: 28 % — ABNORMAL LOW (ref 36.0–46.0)
Hemoglobin: 9 g/dL — ABNORMAL LOW (ref 12.0–15.0)
Immature Granulocytes: 2 %
Lymphocytes Relative: 12 %
Lymphs Abs: 1.8 10*3/uL (ref 0.7–4.0)
MCH: 31.1 pg (ref 26.0–34.0)
MCHC: 32.1 g/dL (ref 30.0–36.0)
MCV: 96.9 fL (ref 80.0–100.0)
Monocytes Absolute: 1.6 10*3/uL — ABNORMAL HIGH (ref 0.1–1.0)
Monocytes Relative: 11 %
Neutro Abs: 10.2 10*3/uL — ABNORMAL HIGH (ref 1.7–7.7)
Neutrophils Relative %: 71 %
Platelets: 305 10*3/uL (ref 150–400)
RBC: 2.89 MIL/uL — ABNORMAL LOW (ref 3.87–5.11)
RDW: 14.2 % (ref 11.5–15.5)
WBC: 14.3 10*3/uL — ABNORMAL HIGH (ref 4.0–10.5)
nRBC: 0.1 % (ref 0.0–0.2)

## 2021-03-20 LAB — MAGNESIUM: Magnesium: 1.8 mg/dL (ref 1.7–2.4)

## 2021-03-20 LAB — GLUCOSE, CAPILLARY
Glucose-Capillary: 138 mg/dL — ABNORMAL HIGH (ref 70–99)
Glucose-Capillary: 214 mg/dL — ABNORMAL HIGH (ref 70–99)
Glucose-Capillary: 224 mg/dL — ABNORMAL HIGH (ref 70–99)
Glucose-Capillary: 199 mg/dL — ABNORMAL HIGH (ref 70–99)

## 2021-03-20 LAB — PHOSPHORUS: Phosphorus: 3.3 mg/dL (ref 2.5–4.6)

## 2021-03-20 NOTE — Plan of Care (Signed)
  Problem: Clinical Measurements: Goal: Ability to maintain clinical measurements within normal limits will improve Outcome: Progressing   Problem: Activity: Goal: Risk for activity intolerance will decrease Outcome: Progressing   Problem: Elimination: Goal: Will not experience complications related to bowel motility Outcome: Progressing

## 2021-03-20 NOTE — Progress Notes (Signed)
PROGRESS NOTE    Sabrina Baker  GEX:[id number] DOB: 05-10-1941 DOA: 03/03/2021 PCP: [name], MD   No chief complaint on file.   Brief Narrative:  Sabrina Baker is Sabrina Baker 80 y.o. female with Sabrina Baker PMH significant for insulin-dependent type 2 diabetes, Charcot foot, CKD stage IIIb, Sabrina Baker. 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 They presented from home to the ED on 03/03/2021 with swelling, erythema, purulence from left cheek x 3 days. In the ED, it was found that they had cellulitis as demonstrated on maxillofacial CT. They were treated with IV antibiotics.  ENT, rheumatology and ID were also consulted. Patient was admitted to medicine service for further workup and management of cellulitis as outlined in detail below.   Assessment & Plan:   Active Problems:   Facial cellulitis   Acute idiopathic gout   Hypoxia  Acute Hypoxic Respiratory Failure Elevated BNP, bilateral LE edema -> seems to be improving CXR with L basilar and potential R retrodiagphrgmatic airspace opacitiy CXR 10/9 with resolving HF (?superimposed multilobar pneumonia - does have slightly elevated WBC, but afebrile - will monitor off abx for now) Continue lasix as tolerated - developing alkalosis, follow in AM Echo with EF 60-65%, moderate LVH, IVC dilated with >50% resp variability  Wean o2 as tolerated, w/u further as indicated   Polyarthritis/acute gouty attack on right thumb and knees-patient declined home health PT and had worsening participation with PT and requested SNF placement.  Patient has been stable and had improvement in her knee pain.  She is awaiting SNF placement at this time. -Continue allopurinol, colchicine -Rheumatology OP - continue PRN oxy IR and tylenol - TOC consulted for SNF placement   Facial cellulitis/carbuncle- afebrile, improving.  Blood cultures negative. Patient has no complaints on her face.  Lesion looks to be drying, though still  swollen.   -ID signed off      -s/p linezolid until 10/3 - ENT signed off - WOC s/o - pain control PRN   AKI with Hyponatremia, hyperkalemia- resolved.  Patient has maintained baseline kidney function. - strict I/O - BMP am   Insulin dependent Type 2 diabetes- Hgb A1c was 8.1 on admission. Takes trulicity at home as well as insulin. - continue sSSI - decreased home Semglee to 10units daily am for history of fasting hypoglycemia this admission -Continue atorvastatin - restarting home trulicity when able (not on formulary)   HTN- chronic, stable -Continue amlodipine, carvedilol   Sabrina Baker. fib-rate controlled -Continue Eliquis -Continue Coreg   Anxiety/depression- -Continue duloxetine   DVT prophylaxis: eliquis Code Status: (full  Family Communication: none at bedside Disposition:   Status is: Inpatient  Remains inpatient appropriate because:Inpatient level of care appropriate due to severity of illness  Dispo:  Patient From:     Planned Disposition:  [place]  Medically stable for discharge:             Consultants:  ID, rheum, ENT  Procedures: none  Antimicrobials:  Anti-infectives (From admission, onward)    Start     Dose/Rate Route Frequency Ordered Stop   03/13/21 0000  linezolid (ZYVOX) 600 MG tablet  Status:  Discontinued       Note to Pharmacy: Stop date of 03/13/21   600 mg Oral 2 times daily 03/13/21 1005 03/16/21    03/08/21 0000  linezolid (ZYVOX) 600 MG tablet  Status:  Discontinued       Note to Pharmacy: Stop date of 03/13/21  600 mg Oral 2 times daily 03/08/21 1135 03/13/21    03/04/21 2200  cefTRIAXone (ROCEPHIN) 2 g in sodium chloride 0.9 % 100 mL IVPB  Status:  Discontinued        2 g 200 mL/hr over 30 Minutes Intravenous Every 24 hours 03/04/21 0327 03/04/21 1444   03/04/21 1600  valACYclovir (VALTREX) tablet 1,000 mg  Status:  Discontinued        1,000 mg Oral 3 times daily 03/04/21 1444 03/04/21 1446   03/04/21 1500   valACYclovir (VALTREX) tablet 1,000 mg  Status:  Discontinued        1,000 mg Oral Daily 03/04/21 1446 03/08/21 1049   03/04/21 0300  linezolid (ZYVOX) tablet 600 mg        600 mg Oral Every 12 hours 03/04/21 0245 03/13/21 2149   03/04/21 0230  cefTRIAXone (ROCEPHIN) 2 g in sodium chloride 0.9 % 100 mL IVPB        2 g 200 mL/hr over 30 Minutes Intravenous  Once 03/04/21 0218 03/04/21 0309          Subjective: Asking about Sabrina Baker shower  Objective: Vitals:   03/19/21 1556 03/19/21 2111 03/20/21 0505 03/20/21 0913  BP:  (!) 147/63 (!) 169/75 (!) 156/67  Pulse: 68 67 75 77  Resp: 16 18 19 19   Temp: 98 F (36.7 C) 98.3 F (36.8 C) 98.4 F (36.9 C) 98.3 F (36.8 C)  TempSrc: Oral Oral    SpO2: 96% 92% 96% 98%  Weight:  78.9 kg    Height:        Intake/Output Summary (Last 24 hours) at 03/20/2021 1627 Last data filed at 03/20/2021 1300 Gross per 24 hour  Intake 840 ml  Output 2725 ml  Net -1885 ml   Filed Weights   03/18/21 0429 03/18/21 2143 03/19/21 2111  Weight: 79.1 kg 78.9 kg 78.9 kg    Examination:  General: No acute distress. Cardiovascular: Heart sounds show Colby Reels regular rate, and rhythm. Lungs: Clear to auscultation bilaterall Abdomen: Soft, nontender, nondistended  Neurological: Alert and oriented 3. Moves all extremities 4. Cranial nerves II through XII grossly intact. Skin: Warm and dry. No rashes or lesions. Extremities: trace edema      Data Reviewed: I have personally reviewed following labs and imaging studies  CBC: Recent Labs  Lab 03/16/21 0307 03/17/21 0821 03/18/21 0931 03/19/21 0256 03/20/21 0406  WBC 15.1* 14.4* 13.8* 13.5* 14.3*  NEUTROABS  --   --   --  9.4* 10.2*  HGB 10.2* 10.4* 8.7* 8.8* 9.0*  HCT 32.0* 32.7* 27.6* 27.8* 28.0*  MCV 100.0 99.1 99.6 98.2 96.9  PLT 258 306 261 295 536    Basic Metabolic Panel: Recent Labs  Lab 03/16/21 0307 03/17/21 0821 03/18/21 0931 03/19/21 0256 03/20/21 0406  NA 135 137 135 136 137  K  4.9 4.7 4.4 3.8 4.1  CL 103 98 98 97* 95*  CO2 25 30 31  33* 35*  GLUCOSE 127* 173* 196* 116* 172*  BUN 11 11 16 14 10   CREATININE 0.85 1.03* 1.16* 1.03* 0.92  CALCIUM 8.7* 8.8* 8.2* 8.3* 8.3*  MG  --   --   --  1.4* 1.8  PHOS  --   --   --  3.3 3.3    GFR: Estimated Creatinine Clearance: 49.3 mL/min (by C-G formula based on SCr of 0.92 mg/dL).  Liver Function Tests: Recent Labs  Lab 03/17/21 0821 03/18/21 0931 03/19/21 0256 03/20/21 0406  AST 22 18 17  21  ALT 20 15 15 18   ALKPHOS 54 51 49 55  BILITOT 0.4 0.4 0.5 0.8  PROT 5.7* 5.1* 5.6* 5.6*  ALBUMIN 2.6* 2.4* 2.7* 2.8*    CBG: Recent Labs  Lab 03/19/21 1133 03/19/21 1620 03/19/21 2111 03/20/21 0650 03/20/21 1129  GLUCAP 228* 126* 258* 138* 214*     Recent Results (from the past 240 hour(s))  Resp Panel by RT-PCR (Flu Gumaro Brightbill&B, Covid) Nasopharyngeal Swab     Status: None   Collection Time: 03/15/21  2:39 PM   Specimen: Nasopharyngeal Swab; Nasopharyngeal(NP) swabs in vial transport medium  Result Value Ref Range Status   SARS Coronavirus 2 by RT PCR NEGATIVE NEGATIVE Final    Comment: (NOTE) SARS-CoV-2 target nucleic acids are NOT DETECTED.  The SARS-CoV-2 RNA is generally detectable in upper respiratory specimens during the acute phase of infection. The lowest concentration of SARS-CoV-2 viral copies this assay can detect is 138 copies/mL. Kayler Buckholtz negative result does not preclude SARS-Cov-2 infection and should not be used as the sole basis for treatment or other patient management decisions. Ellen Mayol negative result may occur with  improper specimen collection/handling, submission of specimen other than nasopharyngeal swab, presence of viral mutation(s) within the areas targeted by this assay, and inadequate number of viral copies(<138 copies/mL). Lynda Wanninger negative result must be combined with clinical observations, patient history, and epidemiological information. The expected result is Negative.  Fact Sheet for Patients:   EntrepreneurPulse.com.au  Fact Sheet for Healthcare Providers:  IncredibleEmployment.be  This test is no t yet approved or cleared by the [place] FDA and  has been authorized for detection and/or diagnosis of SARS-CoV-2 by FDA under an Emergency Use Authorization (EUA). This EUA will remain  in effect (meaning this test can be used) for the duration of the COVID-19 declaration under Section 564(b)(1) of the Act, 21 U.S.C.section 360bbb-3(b)(1), unless the authorization is terminated  or revoked sooner.       Influenza Alverto Shedd by PCR NEGATIVE NEGATIVE Final   Influenza B by PCR NEGATIVE NEGATIVE Final    Comment: (NOTE) The Xpert Xpress SARS-CoV-2/FLU/RSV plus assay is intended as an aid in the diagnosis of influenza from Nasopharyngeal swab specimens and should not be used as Kaleen Rochette sole basis for treatment. Nasal washings and aspirates are unacceptable for Xpert Xpress SARS-CoV-2/FLU/RSV testing.  Fact Sheet for Patients: EntrepreneurPulse.com.au  Fact Sheet for Healthcare Providers: IncredibleEmployment.be  This test is not yet approved or cleared by the [place] FDA and has been authorized for detection and/or diagnosis of SARS-CoV-2 by FDA under an Emergency Use Authorization (EUA). This EUA will remain in effect (meaning this test can be used) for the duration of the COVID-19 declaration under Section 564(b)(1) of the Act, 21 U.S.C. section 360bbb-3(b)(1), unless the authorization is terminated or revoked.  Performed at [facility], [place] [address] [place], [place] [address]          Radiology Studies: DG CHEST PORT 1 VIEW  Result Date: 03/20/2021 CLINICAL DATA:  80 year old female with history of hypoxia. EXAM: PORTABLE CHEST 1 VIEW COMPARISON:  Chest x-ray 03/17/2021. FINDINGS: Lung volumes are normal. Opacity in the medial aspect of the left base which may reflect atelectasis  and/or consolidation. Decreasing small bilateral pleural effusions. Diffuse peribronchial cuffing with widespread interstitial prominence and patchy ill-defined opacities in the lungs bilaterally, somewhat asymmetrically distributed, most severe in the apex of the right lung. No pneumothorax. Cephalization of the pulmonary vasculature. Mild cardiomegaly. Upper mediastinal contours are within normal limits.  Atherosclerotic calcifications in the thoracic aorta. IMPRESSION: 1. The appearance the chest does suggest resolving congestive heart failure, as above. However, given the asymmetry of the pulmonary opacities, the possibility of superimposed multilobar bronchopneumonia is not excluded. 2. Aortic atherosclerosis. Electronically Signed   By: Vinnie Langton M.D.   On: 03/20/2021 08:33   ECHOCARDIOGRAM COMPLETE  Result Date: 03/19/2021    ECHOCARDIOGRAM REPORT   Patient Name:   AMBERLEIGH GERKEN Date of Exam: 03/19/2021 Medical Rec #:  [id number]      Height:       63.0 in Accession #:    [id number]     Weight:       173.9 lb Date of Birth:  02/06/1941      BSA:          1.822 m Patient Age:    80 years       BP:           154/62 mmHg Patient Gender: F              HR:           73 bpm. Exam Location:  Inpatient Procedure: 2D Echo Indications:    congestive heart failure  History:        Patient has prior history of Echocardiogram examinations, most                 recent 12/27/2018.  Sonographer:    Johny Chess RDCS Referring Phys: [place]  1. Left ventricular ejection fraction, by estimation, is 60 to 65%. The left ventricle has normal function. The left ventricle has no regional wall motion abnormalities. There is moderate left ventricular hypertrophy. Left ventricular diastolic parameters are indeterminate.  2. Right ventricular systolic function is normal. The right ventricular size is normal. Tricuspid regurgitation signal is inadequate for assessing PA pressure.  3. Oswell Say small  pericardial effusion is present.  4. The mitral valve is degenerative. No evidence of mitral valve regurgitation. Moderate mitral annular calcification.  5. The aortic valve was not well visualized. There is moderate calcification of the aortic valve. Aortic valve regurgitation is not visualized. Mild to moderate aortic valve stenosis. Vmax 2.3 m/s, MG 12 mmHg, AVA 1.5 cm^2, DI 0.45  6. The inferior vena cava is dilated in size with >50% respiratory variability, suggesting right atrial pressure of 8 mmHg. FINDINGS  Left Ventricle: Left ventricular ejection fraction, by estimation, is 60 to 65%. The left ventricle has normal function. The left ventricle has no regional wall motion abnormalities. The left ventricular internal cavity size was normal in size. There is  moderate left ventricular hypertrophy. Left ventricular diastolic parameters are indeterminate. Right Ventricle: The right ventricular size is normal. No increase in right ventricular wall thickness. Right ventricular systolic function is normal. Tricuspid regurgitation signal is inadequate for assessing PA pressure. Left Atrium: Left atrial size was normal in size. Right Atrium: Right atrial size was normal in size. Pericardium: Karrington Studnicka small pericardial effusion is present. Mitral Valve: The mitral valve is degenerative in appearance. Moderate mitral annular calcification. No evidence of mitral valve regurgitation. Tricuspid Valve: The tricuspid valve is normal in structure. Tricuspid valve regurgitation is trivial. Aortic Valve: The aortic valve was not well visualized. There is moderate calcification of the aortic valve. Aortic valve regurgitation is not visualized. Mild to moderate aortic stenosis is present. Aortic valve mean gradient measures 12.0 mmHg. Aortic valve peak gradient measures 21.7 mmHg. Pulmonic Valve: The pulmonic valve was not  well visualized. Pulmonic valve regurgitation is not visualized. Aorta: The aortic root and ascending aorta are  structurally normal, with no evidence of dilitation. Venous: The inferior vena cava is dilated in size with greater than 50% respiratory variability, suggesting right atrial pressure of 8 mmHg. IAS/Shunts: The interatrial septum was not well visualized.  LEFT VENTRICLE PLAX 2D LVIDd:         4.30 cm Diastology LVIDs:         2.70 cm LV e' medial:    7.94 cm/s LV PW:         0.90 cm LV E/e' medial:  15.4 LV IVS:        1.10 cm LV e' lateral:   6.31 cm/s                        LV E/e' lateral: 19.3  RIGHT VENTRICLE             IVC RV S prime:     15.00 cm/s  IVC diam: 2.10 cm TAPSE (M-mode): 2.0 cm LEFT ATRIUM           Index        RIGHT ATRIUM           Index LA diam:      3.70 cm 2.03 cm/m   RA Area:     13.20 cm LA Vol (A4C): 49.4 ml 27.11 ml/m  RA Volume:   29.10 ml  15.97 ml/m  AORTIC VALVE AV Vmax:           233.00 cm/s AV Vmean:          159.000 cm/s AV VTI:            0.491 m AV Peak Grad:      21.7 mmHg AV Mean Grad:      12.0 mmHg LVOT Vmax:         95.50 cm/s LVOT Vmean:        63.900 cm/s LVOT VTI:          0.215 m LVOT/AV VTI ratio: 0.44  AORTA Ao Asc diam: 3.10 cm MITRAL VALVE MV Area (PHT): 3.72 cm     SHUNTS MV Decel Time: 204 msec     Systemic VTI: 0.22 m MV E velocity: 122.00 cm/s MV Dafina Suk velocity: 139.00 cm/s MV E/Albion Weatherholtz ratio:  0.88 Oswaldo Milian MD Electronically signed by Oswaldo Milian MD Signature Date/Time: 03/19/2021/4:47:41 PM    Final         Scheduled Meds:  acetaminophen  650 mg Oral Q6H   allopurinol  100 mg Oral Daily   amLODipine  10 mg Oral Daily   apixaban  5 mg Oral BID   atorvastatin  10 mg Oral Daily   carvedilol  25 mg Oral BID WC   colchicine  0.3 mg Oral Daily   diclofenac Sodium  2 g Topical QID   DULoxetine  60 mg Oral Daily   furosemide  40 mg Intravenous BID   gabapentin  200 mg Oral QHS   hydrocortisone   Rectal BID   insulin aspart  0-9 Units Subcutaneous TID WC   insulin glargine-yfgn  10 Units Subcutaneous Daily   latanoprost  1 drop Left  Eye QHS   Continuous Infusions:   LOS: 16 days    Time spent: over 47min     Fayrene Helper, MD [facility]   To contact the attending provider between 7A-7P or the covering provider during  after hours 7P-7A, please log into the web site [email or web address] and access using universal [place] password for that web site. If you do not have the password, please call the hospital operator.  03/20/2021, 4:27 PM

## 2021-03-20 NOTE — Plan of Care (Signed)
  Problem: Health Behavior/Discharge Planning: Goal: Ability to manage health-related needs will improve Outcome: Progressing   Problem: Clinical Measurements: Goal: Will remain free from infection Outcome: Progressing Goal: Diagnostic test results will improve Outcome: Progressing Goal: Respiratory complications will improve Outcome: Progressing   Problem: Coping: Goal: Level of anxiety will decrease Outcome: Progressing

## 2021-03-21 LAB — CBC WITH DIFFERENTIAL/PLATELET
Abs Immature Granulocytes: 0.18 10*3/uL — ABNORMAL HIGH (ref 0.00–0.07)
Basophils Absolute: 0.1 10*3/uL (ref 0.0–0.1)
Basophils Relative: 0 %
Eosinophils Absolute: 0.3 10*3/uL (ref 0.0–0.5)
Eosinophils Relative: 2 %
HCT: 31.1 % — ABNORMAL LOW (ref 36.0–46.0)
Hemoglobin: 9.8 g/dL — ABNORMAL LOW (ref 12.0–15.0)
Immature Granulocytes: 1 %
Lymphocytes Relative: 13 %
Lymphs Abs: 1.8 10*3/uL (ref 0.7–4.0)
MCH: 30.8 pg (ref 26.0–34.0)
MCHC: 31.5 g/dL (ref 30.0–36.0)
MCV: 97.8 fL (ref 80.0–100.0)
Monocytes Absolute: 1.4 10*3/uL — ABNORMAL HIGH (ref 0.1–1.0)
Monocytes Relative: 11 %
Neutro Abs: 9.7 10*3/uL — ABNORMAL HIGH (ref 1.7–7.7)
Neutrophils Relative %: 73 %
Platelets: 362 10*3/uL (ref 150–400)
RBC: 3.18 MIL/uL — ABNORMAL LOW (ref 3.87–5.11)
RDW: 14.3 % (ref 11.5–15.5)
WBC: 13.5 10*3/uL — ABNORMAL HIGH (ref 4.0–10.5)
nRBC: 0.4 % — ABNORMAL HIGH (ref 0.0–0.2)

## 2021-03-21 LAB — COMPREHENSIVE METABOLIC PANEL
ALT: 15 U/L (ref 0–44)
AST: 18 U/L (ref 15–41)
Albumin: 2.8 g/dL — ABNORMAL LOW (ref 3.5–5.0)
Alkaline Phosphatase: 56 U/L (ref 38–126)
Anion gap: 9 (ref 5–15)
BUN: 12 mg/dL (ref 8–23)
CO2: 34 mmol/L — ABNORMAL HIGH (ref 22–32)
Calcium: 8.3 mg/dL — ABNORMAL LOW (ref 8.9–10.3)
Chloride: 96 mmol/L — ABNORMAL LOW (ref 98–111)
Creatinine, Ser: 0.98 mg/dL (ref 0.44–1.00)
GFR, Estimated: 59 mL/min — ABNORMAL LOW (ref >60)
Glucose, Bld: 167 mg/dL — ABNORMAL HIGH (ref 70–99)
Potassium: 4 mmol/L (ref 3.5–5.1)
Sodium: 139 mmol/L (ref 135–145)
Total Bilirubin: 0.8 mg/dL (ref 0.3–1.2)
Total Protein: 5.7 g/dL — ABNORMAL LOW (ref 6.5–8.1)

## 2021-03-21 LAB — GLUCOSE, CAPILLARY
Glucose-Capillary: 160 mg/dL — ABNORMAL HIGH (ref 70–99)
Glucose-Capillary: 173 mg/dL — ABNORMAL HIGH (ref 70–99)
Glucose-Capillary: 116 mg/dL — ABNORMAL HIGH (ref 70–99)
Glucose-Capillary: 244 mg/dL — ABNORMAL HIGH (ref 70–99)

## 2021-03-21 LAB — MAGNESIUM: Magnesium: 1.7 mg/dL (ref 1.7–2.4)

## 2021-03-21 LAB — PROCALCITONIN: Procalcitonin: 34.42 ng/mL

## 2021-03-21 LAB — PHOSPHORUS: Phosphorus: 4 mg/dL (ref 2.5–4.6)

## 2021-03-21 MED ORDER — FUROSEMIDE 10 MG/ML IJ SOLN
40.0000 mg | Freq: Once | INTRAMUSCULAR | Status: AC
  Administered 2021-03-21: 40 mg via INTRAVENOUS
  Filled 2021-03-21: qty 4

## 2021-03-21 NOTE — TOC Progression Note (Signed)
Transition of Care [facility]) - Progression Note    Patient Details  Name: CORLEEN OTWELL MRN: [id number] Date of Birth: Jun 01, 1941  Transition of Care [facility]) CM/SW Contact  [name], LCSW Phone Number: 03/21/2021, 7:23 PM  Clinical Narrative:  Visited with Ms.Wiswell and informed her that a call was made to [facility] earlier today and message left, and no return call. Ms. Hise informed that a follow-up call will be made on Tuesday, 10/11.  She was provided with the names of other SNF's that made a bed offer and informed that she may have to choose another facility. CSW will then contact facility to determine if they have bed availability. Ms. Jewell expressed understanding.     Expected Discharge Plan: Home/Self Care Barriers to Discharge: No Barriers Identified  Expected Discharge Plan and Services Expected Discharge Plan: Home/Self Care   Discharge Planning Services: CM Consult, NA Post Acute Care Choice: NA Living arrangements for the past 2 months: Single Family Home Expected Discharge Date: 03/13/21               DME Arranged: N/A DME Agency: NA       HH Arranged: NA HH Agency: NA         Social Determinants of Health (SDOH) Interventions  No SDOH interventions requested or needed at this time.  Readmission Risk Interventions No flowsheet data found.

## 2021-03-21 NOTE — Plan of Care (Signed)
  Problem: Clinical Measurements: Goal: Ability to maintain clinical measurements within normal limits will improve Outcome: Progressing Goal: Will remain free from infection Outcome: Progressing Goal: Respiratory complications will improve Outcome: Progressing   

## 2021-03-21 NOTE — Progress Notes (Signed)
Physical Therapy Treatment Patient Details Name: Sabrina Baker MRN: [id number] DOB: 09/02/1940 Today's Date: 03/21/2021   History of Present Illness 80 y.o. female with medical history significant for insulin-dependent type 2 diabetes, diabetic polyneuropathy, Charcot foot, CKD 3B, paroxysmal A. fib on Eliquis, hyperlipidemia, hypertension, tophaceous gout, iron deficiency anemia, anemia of chronic disease, chronic anxiety/depression, who presented to [facility] ED from home due to gradually worsening left facial cellulitis.    PT Comments    Pt making excellent progress towards her physical therapy goals; demonstrating improved respiratory status, pain control, and activity tolerance. Pt donning socks/shoes on edge of bed modI. Ambulating 150 feet with a walker at a min assist level. SpO2 92% on RA, desaturation to 80% when ambulating, but rebounded to 93% when placed on 2L O2 for remainder of walk. Will continue to benefit from post acute rehab to address deficits and maximize functional mobility in setting of decreased caregiver support.     Recommendations for follow up therapy are one component of a multi-disciplinary discharge planning process, led by the attending physician.  Recommendations may be updated based on patient status, additional functional criteria and insurance authorization.  Follow Up Recommendations  SNF     Equipment Recommendations  None recommended by PT    Recommendations for Other Services       Precautions / Restrictions Precautions Precautions: Fall;Other (comment) Precaution Comments: watch O2 Restrictions Weight Bearing Restrictions: No     Mobility  Bed Mobility Overal bed mobility: Modified Independent                  Transfers Overall transfer level: Needs assistance Equipment used: Straight cane Transfers: Sit to/from Stand Sit to Stand: Supervision            Ambulation/Gait Ambulation/Gait assistance: Min assist Gait Distance  (Feet): 150 Feet Assistive device: Straight cane Gait Pattern/deviations: Step-through pattern;Decreased stride length;Trunk flexed;Wide base of support Gait velocity: decreased   General Gait Details: Mild dynamic instability, requiring min assist, increased lateral sway, wider BOS   Stairs             Wheelchair Mobility    Modified Rankin (Stroke Patients Only)       Balance Overall balance assessment: Needs assistance Sitting-balance support: Feet supported Sitting balance-Leahy Scale: Good     Standing balance support: During functional activity;Single extremity supported Standing balance-Leahy Scale: Poor                              Cognition Arousal/Alertness: Awake/alert Behavior During Therapy: WFL for tasks assessed/performed Overall Cognitive Status: Within Functional Limits for tasks assessed                                        Exercises      General Comments        Pertinent Vitals/Pain Pain Assessment: Faces Faces Pain Scale: Hurts a little bit Pain Location: knees Pain Descriptors / Indicators: Discomfort;Sore Pain Intervention(s): Monitored during session    Home Living                      Prior Function            PT Goals (current goals can now be found in the care plan section) Acute Rehab PT Goals Patient Stated Goal: to go to rehab Potential to  Achieve Goals: Good Progress towards PT goals: Progressing toward goals    Frequency    Min 2X/week      PT Plan Current plan remains appropriate    Co-evaluation              AM-PAC PT "6 Clicks" Mobility   Outcome Measure  Help needed turning from your back to your side while in a flat bed without using bedrails?: None Help needed moving from lying on your back to sitting on the side of a flat bed without using bedrails?: None Help needed moving to and from a bed to a chair (including a wheelchair)?: A Little Help needed  standing up from a chair using your arms (e.g., wheelchair or bedside chair)?: A Little Help needed to walk in hospital room?: A Little Help needed climbing 3-5 steps with a railing? : A Little 6 Click Score: 20    End of Session Equipment Utilized During Treatment: Oxygen Activity Tolerance: Patient tolerated treatment well Patient left: in chair;with call bell/phone within reach Nurse Communication: Mobility status PT Visit Diagnosis: Other abnormalities of gait and mobility (R26.89);Pain;Difficulty in walking, not elsewhere classified (R26.2)     Time: 1517-6160 PT Time Calculation (min) (ACUTE ONLY): 32 min  Charges:  $Therapeutic Activity: 23-37 mins                     Wyona Almas, PT, DPT Acute Rehabilitation Services Pager [phone] Office [phone]    Deno Etienne 03/21/2021, 12:00 PM

## 2021-03-21 NOTE — Progress Notes (Signed)
PROGRESS NOTE    Sabrina Baker  FBP:[id number] DOB: Jan 10, 1941 DOA: 03/03/2021 PCP: [name], MD   No chief complaint on file.   Brief Narrative:  Sabrina Baker is Sabrina Baker 80 y.o. female with Sabrina Baker PMH significant for insulin-dependent type 2 diabetes, Charcot foot, CKD stage IIIb, Sabrina Baker. 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 They presented from home to the ED on 03/03/2021 with swelling, erythema, purulence from left cheek x 3 days. In the ED, it was found that they had cellulitis as demonstrated on maxillofacial CT. They were treated with IV antibiotics.  ENT, rheumatology and ID were also consulted. Patient was admitted to medicine service for further workup and management of cellulitis as outlined in detail below.   Assessment & Plan:   Active Problems:   Facial cellulitis   Acute idiopathic gout   Hypoxia  Elevated Procal  Leukocytosis Afebrile, imaging concerning for possible pneumonia, but clinically appears to be improving with lasix Facial cellulitis continuing to improve Not sure what significance of this significantly elevated procal is Trend  Acute Hypoxic Respiratory Failure Elevated BNP, bilateral LE edema -> seems to be improving CXR with L basilar and potential R retrodiagphrgmatic airspace opacitiy CXR 10/9 with resolving HF (?superimposed multilobar pneumonia - does have slightly elevated WBC, but afebrile - will monitor off abx for now) Continue lasix as tolerated - developing alkalosis, follow in AM Echo with EF 60-65%, moderate LVH, IVC dilated with >50% resp variability  Wean o2 as tolerated, w/u further as indicated (continuing to require O2 with activvity)  Polyarthritis/acute gouty attack on right thumb and knees-patient declined home health PT and had worsening participation with PT and requested SNF placement.  Patient has been stable and had improvement in her knee pain.  She is awaiting SNF placement at  this time. -Continue allopurinol, colchicine -Rheumatology OP - continue PRN oxy IR and tylenol - TOC consulted for SNF placement   Facial cellulitis/carbuncle- afebrile, improving.  Blood cultures negative. Patient has no complaints on her face.  Lesion looks to be drying, though still swollen.   -ID signed off      -s/p linezolid until 10/3 - ENT signed off - WOC s/o - pain control PRN   AKI with Hyponatremia, hyperkalemia- resolved.  Patient has maintained baseline kidney function. - strict I/O - BMP am   Insulin dependent Type 2 diabetes- Hgb A1c was 8.1 on admission. Takes trulicity at home as well as insulin. - continue sSSI - decreased home Semglee to 10units daily am for history of fasting hypoglycemia this admission -Continue atorvastatin - restarting home trulicity when able (not on formulary)   HTN- chronic, stable -Continue amlodipine, carvedilol   Sabrina Baker. fib-rate controlled -Continue Eliquis -Continue Coreg   Anxiety/depression- -Continue duloxetine   DVT prophylaxis: eliquis Code Status: (full  Family Communication: none at bedside Disposition:   Status is: Inpatient  Remains inpatient appropriate because:Inpatient level of care appropriate due to severity of illness  Dispo:  Patient From:     Planned Disposition:  [place]  Medically stable for discharge:             Consultants:  ID, rheum, ENT  Procedures: none  Antimicrobials:  Anti-infectives (From admission, onward)    Start     Dose/Rate Route Frequency Ordered Stop   03/13/21 0000  linezolid (ZYVOX) 600 MG tablet  Status:  Discontinued       Note to Pharmacy: Stop date of 03/13/21  600 mg Oral 2 times daily 03/13/21 1005 03/16/21    03/08/21 0000  linezolid (ZYVOX) 600 MG tablet  Status:  Discontinued       Note to Pharmacy: Stop date of 03/13/21   600 mg Oral 2 times daily 03/08/21 1135 03/13/21    03/04/21 2200  cefTRIAXone (ROCEPHIN) 2 g in sodium chloride 0.9  % 100 mL IVPB  Status:  Discontinued        2 g 200 mL/hr over 30 Minutes Intravenous Every 24 hours 03/04/21 0327 03/04/21 1444   03/04/21 1600  valACYclovir (VALTREX) tablet 1,000 mg  Status:  Discontinued        1,000 mg Oral 3 times daily 03/04/21 1444 03/04/21 1446   03/04/21 1500  valACYclovir (VALTREX) tablet 1,000 mg  Status:  Discontinued        1,000 mg Oral Daily 03/04/21 1446 03/08/21 1049   03/04/21 0300  linezolid (ZYVOX) tablet 600 mg        600 mg Oral Every 12 hours 03/04/21 0245 03/13/21 2149   03/04/21 0230  cefTRIAXone (ROCEPHIN) 2 g in sodium chloride 0.9 % 100 mL IVPB        2 g 200 mL/hr over 30 Minutes Intravenous  Once 03/04/21 0218 03/04/21 0309          Subjective: No complaints today  Objective: Vitals:   03/21/21 0500 03/21/21 0616 03/21/21 0745 03/21/21 1200  BP:  (!) 153/79 (!) 149/92 (!) 141/68  Pulse:  77 89   Resp:  18 20   Temp:  98.7 F (37.1 C) 99.2 F (37.3 C)   TempSrc:  Oral Oral   SpO2:  92% 94% 94%  Weight: 72.7 kg     Height:        Intake/Output Summary (Last 24 hours) at 03/21/2021 1708 Last data filed at 03/21/2021 1201 Gross per 24 hour  Intake 480 ml  Output 1650 ml  Net -1170 ml   Filed Weights   03/19/21 2111 03/20/21 2136 03/21/21 0500  Weight: 78.9 kg 73 kg 72.7 kg    Examination:  General: No acute distress. Cardiovascular: Heart sounds show Sabrina Baker regular rate, and rhythm.  Lungs: Clear to auscultation bilaterally Abdomen: Soft, nontender, nondistended Neurological: Alert and oriented 3. Moves all extremities 4. Cranial nerves II through XII grossly intact. Skin: Warm and dry. No rashes or lesions. Extremities: trace edema      Data Reviewed: I have personally reviewed following labs and imaging studies  CBC: Recent Labs  Lab 03/17/21 0821 03/18/21 0931 03/19/21 0256 03/20/21 0406 03/21/21 0356  WBC 14.4* 13.8* 13.5* 14.3* 13.5*  NEUTROABS  --   --  9.4* 10.2* 9.7*  HGB 10.4* 8.7* 8.8* 9.0*  9.8*  HCT 32.7* 27.6* 27.8* 28.0* 31.1*  MCV 99.1 99.6 98.2 96.9 97.8  PLT 306 261 295 305 631    Basic Metabolic Panel: Recent Labs  Lab 03/17/21 0821 03/18/21 0931 03/19/21 0256 03/20/21 0406 03/21/21 0356  NA 137 135 136 137 139  K 4.7 4.4 3.8 4.1 4.0  CL 98 98 97* 95* 96*  CO2 30 31 33* 35* 34*  GLUCOSE 173* 196* 116* 172* 167*  BUN 11 16 14 10 12   CREATININE 1.03* 1.16* 1.03* 0.92 0.98  CALCIUM 8.8* 8.2* 8.3* 8.3* 8.3*  MG  --   --  1.4* 1.8 1.7  PHOS  --   --  3.3 3.3 4.0    GFR: Estimated Creatinine Clearance: 44.5 mL/min (by C-G formula based  on SCr of 0.98 mg/dL).  Liver Function Tests: Recent Labs  Lab 03/17/21 0821 03/18/21 0931 03/19/21 0256 03/20/21 0406 03/21/21 0356  AST 22 18 17 21 18   ALT 20 15 15 18 15   ALKPHOS 54 51 49 55 56  BILITOT 0.4 0.4 0.5 0.8 0.8  PROT 5.7* 5.1* 5.6* 5.6* 5.7*  ALBUMIN 2.6* 2.4* 2.7* 2.8* 2.8*    CBG: Recent Labs  Lab 03/20/21 1653 03/20/21 2135 03/21/21 0616 03/21/21 1137 03/21/21 1659  GLUCAP 224* 199* 160* 173* 116*     Recent Results (from the past 240 hour(s))  Resp Panel by RT-PCR (Flu Sabrina Baker&B, Covid) Nasopharyngeal Swab     Status: None   Collection Time: 03/15/21  2:39 PM   Specimen: Nasopharyngeal Swab; Nasopharyngeal(NP) swabs in vial transport medium  Result Value Ref Range Status   SARS Coronavirus 2 by RT PCR NEGATIVE NEGATIVE Final    Comment: (NOTE) SARS-CoV-2 target nucleic acids are NOT DETECTED.  The SARS-CoV-2 RNA is generally detectable in upper respiratory specimens during the acute phase of infection. The lowest concentration of SARS-CoV-2 viral copies this assay can detect is 138 copies/mL. Sabrina Baker negative result does not preclude SARS-Cov-2 infection and should not be used as the sole basis for treatment or other patient management decisions. Sabrina Baker negative result may occur with  improper specimen collection/handling, submission of specimen other than nasopharyngeal swab, presence of viral  mutation(s) within the areas targeted by this assay, and inadequate number of viral copies(<138 copies/mL). Sabrina Baker negative result must be combined with clinical observations, patient history, and epidemiological information. The expected result is Negative.  Fact Sheet for Patients:  EntrepreneurPulse.com.au  Fact Sheet for Healthcare Providers:  IncredibleEmployment.be  This test is no t yet approved or cleared by the [place] FDA and  has been authorized for detection and/or diagnosis of SARS-CoV-2 by FDA under an Emergency Use Authorization (EUA). This EUA will remain  in effect (meaning this test can be used) for the duration of the COVID-19 declaration under Section 564(b)(1) of the Act, 21 U.S.C.section 360bbb-3(b)(1), unless the authorization is terminated  or revoked sooner.       Influenza [place] by PCR NEGATIVE NEGATIVE Final   Influenza B by PCR NEGATIVE NEGATIVE Final    Comment: (NOTE) The Xpert Xpress SARS-CoV-2/FLU/RSV plus assay is intended as an aid in the diagnosis of influenza from Nasopharyngeal swab specimens and should not be used as Sabrina Baker sole basis for treatment. Nasal washings and aspirates are unacceptable for Xpert Xpress SARS-CoV-2/FLU/RSV testing.  Fact Sheet for Patients: EntrepreneurPulse.com.au  Fact Sheet for Healthcare Providers: IncredibleEmployment.be  This test is not yet approved or cleared by the [place] FDA and has been authorized for detection and/or diagnosis of SARS-CoV-2 by FDA under an Emergency Use Authorization (EUA). This EUA will remain in effect (meaning this test can be used) for the duration of the COVID-19 declaration under Section 564(b)(1) of the Act, 21 U.S.C. section 360bbb-3(b)(1), unless the authorization is terminated or revoked.  Performed at [facility], [place] [address] [place], [place] [address]          Radiology Studies: DG  CHEST PORT 1 VIEW  Result Date: 03/20/2021 CLINICAL DATA:  80 year old female with history of hypoxia. EXAM: PORTABLE CHEST 1 VIEW COMPARISON:  Chest x-ray 03/17/2021. FINDINGS: Lung volumes are normal. Opacity in the medial aspect of the left base which may reflect atelectasis and/or consolidation. Decreasing small bilateral pleural effusions. Diffuse peribronchial cuffing with widespread interstitial prominence and patchy  ill-defined opacities in the lungs bilaterally, somewhat asymmetrically distributed, most severe in the apex of the right lung. No pneumothorax. Cephalization of the pulmonary vasculature. Mild cardiomegaly. Upper mediastinal contours are within normal limits. Atherosclerotic calcifications in the thoracic aorta. IMPRESSION: 1. The appearance the chest does suggest resolving congestive heart failure, as above. However, given the asymmetry of the pulmonary opacities, the possibility of superimposed multilobar bronchopneumonia is not excluded. 2. Aortic atherosclerosis. Electronically Signed   By: Vinnie Langton M.D.   On: 03/20/2021 08:33        Scheduled Meds:  acetaminophen  650 mg Oral Q6H   allopurinol  100 mg Oral Daily   amLODipine  10 mg Oral Daily   apixaban  5 mg Oral BID   atorvastatin  10 mg Oral Daily   carvedilol  25 mg Oral BID WC   colchicine  0.3 mg Oral Daily   diclofenac Sodium  2 g Topical QID   DULoxetine  60 mg Oral Daily   furosemide  40 mg Intravenous Once   gabapentin  200 mg Oral QHS   hydrocortisone   Rectal BID   insulin aspart  0-9 Units Subcutaneous TID WC   insulin glargine-yfgn  10 Units Subcutaneous Daily   latanoprost  1 drop Left Eye QHS   Continuous Infusions:   LOS: 17 days    Time spent: over 61min     Fayrene Helper, MD [facility]   To contact the attending provider between 7A-7P or the covering provider during after hours 7P-7A, please log into the web site [email or web address] and access using universal [place]  password for that web site. If you do not have the password, please call the hospital operator.  03/21/2021, 5:08 PM

## 2021-03-21 NOTE — Progress Notes (Signed)
SATURATION QUALIFICATIONS: (This note is used to comply with regulatory documentation for home oxygen)  Patient Saturations on Room Air at Rest = 92%  Patient Saturations on Room Air while Ambulating = 80%  Patient Saturations on 2 Liters of oxygen while Ambulating = 93%  Please briefly explain why patient needs home oxygen: To maintain oxygen saturations > 90% when ambulating.  Wyona Almas, PT, DPT Acute Rehabilitation Services Pager [phone] Office [phone]

## 2021-03-21 NOTE — Progress Notes (Signed)
Occupational Therapy Treatment Patient Details Name: Sabrina Baker MRN: [id number] DOB: 07-Jun-1941 Today's Date: 03/21/2021   History of present illness 80 y.o. female with medical history significant for insulin-dependent type 2 diabetes, diabetic polyneuropathy, Charcot foot, CKD 3B, paroxysmal A. fib on Eliquis, hyperlipidemia, hypertension, tophaceous gout, iron deficiency anemia, anemia of chronic disease, chronic anxiety/depression, who presented to [facility] ED from home due to gradually worsening left facial cellulitis.   OT comments  Patient required assistance off BSC and toilet hygiene when therapist arrived.  Patient required assistance for hygiene due patient had soiled herself. Patient was assisted with shower for bathing with min guard to transfer into walk in shower and supervision for bathing while seated and min assist for LB bathing when standing.Patient continues to require assistance with donning right shoe.  Acute OT to continue to follow.    Recommendations for follow up therapy are one component of a multi-disciplinary discharge planning process, led by the attending physician.  Recommendations may be updated based on patient status, additional functional criteria and insurance authorization.    Follow Up Recommendations  No OT follow up    Equipment Recommendations  None recommended by OT    Recommendations for Other Services      Precautions / Restrictions Precautions Precautions: Fall;Other (comment) Precaution Comments: watch O2 Restrictions Weight Bearing Restrictions: No       Mobility Bed Mobility Overal bed mobility: Modified Independent             General bed mobility comments: OOB in chair    Transfers Overall transfer level: Needs assistance Equipment used: Rolling walker (2 wheeled) Transfers: Sit to/from Stand Sit to Stand: Supervision         General transfer comment: verbal cues for safety    Balance Overall balance assessment:  Needs assistance Sitting-balance support: Feet supported Sitting balance-Leahy Scale: Good     Standing balance support: During functional activity;Single extremity supported Standing balance-Leahy Scale: Poor Standing balance comment: stood in shower using rails for support                           ADL either performed or assessed with clinical judgement   ADL Overall ADL's : Needs assistance/impaired     Grooming: Brushing hair;Sitting Grooming Details (indicate cue type and reason): performed seated following shower Upper Body Bathing: Supervision/ safety;Sitting Upper Body Bathing Details (indicate cue type and reason): performed seated in shower Lower Body Bathing: Minimal assistance;Sitting/lateral leans;Sit to/from stand Lower Body Bathing Details (indicate cue type and reason): performed in shower seated on BSC and stood for pericare Upper Body Dressing : Supervision/safety;Standing   Lower Body Dressing: Minimal assistance;Sitting/lateral leans;Sit to/from stand Lower Body Dressing Details (indicate cue type and reason): continues to require assistance for right shoe Toilet Transfer: Stand-pivot;RW;Minimal assistance;Regular Glass blower/designer Details (indicate cue type and reason): required assistance to lower to toilet due to low toilet seat Toileting- Clothing Manipulation and Hygiene: Moderate assistance;Sitting/lateral lean;Sit to/from stand Toileting - Clothing Manipulation Details (indicate cue type and reason): to complete Tub/ Shower Transfer: Walk-in shower;Min guard;Grab Paediatric nurse Details (indicate cue type and reason): verbal cues for safety Functional mobility during ADLs: Min guard;Rolling walker General ADL Comments: patient performed bathing with shower seated on BSC and stood for pericare using rails for assist     Vision       Perception     Praxis      Cognition Arousal/Alertness: Awake/alert Behavior During  Therapy: WFL for tasks assessed/performed Overall Cognitive Status: Within Functional Limits for tasks assessed                                 General Comments: demonstated good safety in shower        Exercises     Shoulder Instructions       General Comments      Pertinent Vitals/ Pain       Pain Assessment: Faces Faces Pain Scale: Hurts a little bit Pain Location: knees Pain Descriptors / Indicators: Discomfort;Sore Pain Intervention(s): Monitored during session  Home Living                                          Prior Functioning/Environment              Frequency  Min 2X/week        Progress Toward Goals  OT Goals(current goals can now be found in the care plan section)  Progress towards OT goals: Progressing toward goals  Acute Rehab OT Goals Patient Stated Goal: to go to rehab OT Goal Formulation: With patient Time For Goal Achievement: 03/24/21 Potential to Achieve Goals: Good ADL Goals Pt Will Perform Lower Body Bathing: with modified independence;sit to/from stand Pt Will Perform Upper Body Dressing: with modified independence;sitting Pt Will Perform Lower Body Dressing: with modified independence;sit to/from stand Pt Will Transfer to Toilet: with modified independence;ambulating Pt Will Perform Toileting - Clothing Manipulation and hygiene: with modified independence Additional ADL Goal #1: Pt will independently verbalize 3 strategies to reduce risk of falls  Plan Discharge plan remains appropriate    Co-evaluation                 AM-PAC OT "6 Clicks" Daily Activity     Outcome Measure   Help from another person eating meals?: None Help from another person taking care of personal grooming?: None Help from another person toileting, which includes using toliet, bedpan, or urinal?: A Little Help from another person bathing (including washing, rinsing, drying)?: A Little Help from another person to  put on and taking off regular upper body clothing?: None Help from another person to put on and taking off regular lower body clothing?: A Little 6 Click Score: 21    End of Session Equipment Utilized During Treatment: Gait belt;Rolling walker  OT Visit Diagnosis: Unsteadiness on feet (R26.81);Pain Pain - Right/Left: Right Pain - part of body: Knee;Leg   Activity Tolerance Patient tolerated treatment well   Patient Left in chair;with call bell/phone within reach;with chair alarm set   Nurse Communication Mobility status        Time: 1240-1318 OT Time Calculation (min): 38 min  Charges: OT General Charges $OT Visit: 1 Visit OT Treatments $Self Care/Home Management : 38-52 mins  [name], OTA   Morgane Joerger Alexis Goodell 03/21/2021, 1:44 PM

## 2021-03-22 ENCOUNTER — Encounter (HOSPITAL_COMMUNITY): Payer: Self-pay | Admitting: Internal Medicine

## 2021-03-22 DIAGNOSIS — I5031 Acute diastolic (congestive) heart failure: Secondary | ICD-10-CM

## 2021-03-22 DIAGNOSIS — I3139 Other pericardial effusion (noninflammatory): Secondary | ICD-10-CM

## 2021-03-22 DIAGNOSIS — I350 Nonrheumatic aortic (valve) stenosis: Secondary | ICD-10-CM

## 2021-03-22 LAB — COMPREHENSIVE METABOLIC PANEL
ALT: 17 U/L (ref 0–44)
AST: 20 U/L (ref 15–41)
Albumin: 2.9 g/dL — ABNORMAL LOW (ref 3.5–5.0)
Alkaline Phosphatase: 64 U/L (ref 38–126)
Anion gap: 8 (ref 5–15)
BUN: 16 mg/dL (ref 8–23)
CO2: 35 mmol/L — ABNORMAL HIGH (ref 22–32)
Calcium: 8.3 mg/dL — ABNORMAL LOW (ref 8.9–10.3)
Chloride: 96 mmol/L — ABNORMAL LOW (ref 98–111)
Creatinine, Ser: 1.15 mg/dL — ABNORMAL HIGH (ref 0.44–1.00)
GFR, Estimated: 48 mL/min — ABNORMAL LOW (ref >60)
Glucose, Bld: 137 mg/dL — ABNORMAL HIGH (ref 70–99)
Potassium: 4.5 mmol/L (ref 3.5–5.1)
Sodium: 139 mmol/L (ref 135–145)
Total Bilirubin: 0.5 mg/dL (ref 0.3–1.2)
Total Protein: 5.6 g/dL — ABNORMAL LOW (ref 6.5–8.1)

## 2021-03-22 LAB — CBC WITH DIFFERENTIAL/PLATELET
Abs Immature Granulocytes: 0.09 10*3/uL — ABNORMAL HIGH (ref 0.00–0.07)
Basophils Absolute: 0.1 10*3/uL (ref 0.0–0.1)
Basophils Relative: 1 %
Eosinophils Absolute: 0.2 10*3/uL (ref 0.0–0.5)
Eosinophils Relative: 2 %
HCT: 34.1 % — ABNORMAL LOW (ref 36.0–46.0)
Hemoglobin: 11.1 g/dL — ABNORMAL LOW (ref 12.0–15.0)
Immature Granulocytes: 1 %
Lymphocytes Relative: 15 %
Lymphs Abs: 1.6 10*3/uL (ref 0.7–4.0)
MCH: 31.4 pg (ref 26.0–34.0)
MCHC: 32.6 g/dL (ref 30.0–36.0)
MCV: 96.6 fL (ref 80.0–100.0)
Monocytes Absolute: 1.4 10*3/uL — ABNORMAL HIGH (ref 0.1–1.0)
Monocytes Relative: 14 %
Neutro Abs: 7 10*3/uL (ref 1.7–7.7)
Neutrophils Relative %: 67 %
Platelets: 393 10*3/uL (ref 150–400)
RBC: 3.53 MIL/uL — ABNORMAL LOW (ref 3.87–5.11)
RDW: 14.6 % (ref 11.5–15.5)
WBC: 10.4 10*3/uL (ref 4.0–10.5)
nRBC: 0.2 % (ref 0.0–0.2)

## 2021-03-22 LAB — PHOSPHORUS: Phosphorus: 5.6 mg/dL — ABNORMAL HIGH (ref 2.5–4.6)

## 2021-03-22 LAB — RESP PANEL BY RT-PCR (FLU A&B, COVID) ARPGX2
Influenza A by PCR: NEGATIVE
Influenza B by PCR: NEGATIVE
SARS Coronavirus 2 by RT PCR: NEGATIVE

## 2021-03-22 LAB — GLUCOSE, CAPILLARY
Glucose-Capillary: 171 mg/dL — ABNORMAL HIGH (ref 70–99)
Glucose-Capillary: 174 mg/dL — ABNORMAL HIGH (ref 70–99)
Glucose-Capillary: 205 mg/dL — ABNORMAL HIGH (ref 70–99)
Glucose-Capillary: 116 mg/dL — ABNORMAL HIGH (ref 70–99)
Glucose-Capillary: 132 mg/dL — ABNORMAL HIGH (ref 70–99)

## 2021-03-22 LAB — MAGNESIUM: Magnesium: 1.8 mg/dL (ref 1.7–2.4)

## 2021-03-22 LAB — PROCALCITONIN: Procalcitonin: 0.1 ng/mL

## 2021-03-22 LAB — BRAIN NATRIURETIC PEPTIDE: B Natriuretic Peptide: 100.3 pg/mL — ABNORMAL HIGH (ref 0.0–100.0)

## 2021-03-22 MED ORDER — TOUJEO MAX SOLOSTAR 300 UNIT/ML ~~LOC~~ SOPN: 10.0000 [IU] | PEN_INJECTOR | Freq: Every day | SUBCUTANEOUS | Status: DC

## 2021-03-22 MED ORDER — FUROSEMIDE 10 MG/ML IJ SOLN
40.0000 mg | Freq: Once | INTRAMUSCULAR | Status: AC
  Administered 2021-03-22: 40 mg via INTRAVENOUS
  Filled 2021-03-22: qty 4

## 2021-03-22 MED ORDER — FUROSEMIDE 40 MG PO TABS
40.0000 mg | ORAL_TABLET | Freq: Every day | ORAL | Status: DC
  Administered 2021-03-23: 40 mg via ORAL
  Filled 2021-03-22: qty 1

## 2021-03-22 MED ORDER — FUROSEMIDE 40 MG PO TABS: 40.0000 mg | ORAL_TABLET | Freq: Every day | ORAL | 0 refills | Status: DC

## 2021-03-22 MED ORDER — COLCHICINE 0.6 MG PO TABS: 0.3000 mg | ORAL_TABLET | Freq: Every day | ORAL | Status: DC

## 2021-03-22 NOTE — Plan of Care (Signed)
  Problem: Education: Goal: Knowledge of General Education information will improve Description: Including pain rating scale, medication(s)/side effects and non-pharmacologic comfort measures 03/22/2021 1228 by Dolores Hoose, RN Outcome: Adequate for Discharge 03/22/2021 0745 by Dolores Hoose, RN Outcome: Progressing   Problem: Health Behavior/Discharge Planning: Goal: Ability to manage health-related needs will improve Outcome: Adequate for Discharge   Problem: Clinical Measurements: Goal: Ability to maintain clinical measurements within normal limits will improve Outcome: Adequate for Discharge Goal: Will remain free from infection 03/22/2021 1228 by Dolores Hoose, RN Outcome: Adequate for Discharge 03/22/2021 0745 by Dolores Hoose, RN Outcome: Progressing Goal: Diagnostic test results will improve Outcome: Adequate for Discharge Goal: Respiratory complications will improve 03/22/2021 1228 by Dolores Hoose, RN Outcome: Adequate for Discharge 03/22/2021 0745 by Dolores Hoose, RN Outcome: Progressing Goal: Cardiovascular complication will be avoided Outcome: Adequate for Discharge   Problem: Activity: Goal: Risk for activity intolerance will decrease Outcome: Adequate for Discharge   Problem: Nutrition: Goal: Adequate nutrition will be maintained Outcome: Adequate for Discharge   Problem: Coping: Goal: Level of anxiety will decrease 03/22/2021 1228 by Dolores Hoose, RN Outcome: Adequate for Discharge 03/22/2021 0745 by Dolores Hoose, RN Outcome: Progressing   Problem: Elimination: Goal: Will not experience complications related to bowel motility Outcome: Adequate for Discharge Goal: Will not experience complications related to urinary retention Outcome: Adequate for Discharge   Problem: Pain Managment: Goal: General experience of comfort will improve Outcome: Adequate for Discharge   Problem: Safety: Goal: Ability to remain free  from injury will improve Outcome: Adequate for Discharge   Problem: Skin Integrity: Goal: Risk for impaired skin integrity will decrease Outcome: Adequate for Discharge   Problem: Acute Rehab PT Goals(only PT should resolve) Goal: Patient Will Transfer Sit To/From Stand Outcome: Adequate for Discharge Goal: Pt Will Ambulate Outcome: Adequate for Discharge Goal: Pt Will Go Up/Down Stairs Outcome: Adequate for Discharge   Problem: Acute Rehab OT Goals (only OT should resolve) Goal: Pt. Will Perform Lower Body Bathing Outcome: Adequate for Discharge Goal: Pt. Will Perform Upper Body Dressing Outcome: Adequate for Discharge Goal: Pt. Will Perform Lower Body Dressing Outcome: Adequate for Discharge Goal: Pt. Will Transfer To Toilet Outcome: Adequate for Discharge Goal: Pt. Will Perform Toileting-Clothing Manipulation Outcome: Adequate for Discharge Goal: OT Additional ADL Goal #1 Outcome: Adequate for Discharge

## 2021-03-22 NOTE — TOC Progression Note (Addendum)
Transition of Care (TOC) - Progression Note  *Insurance authorization received   Patient Details  Name: Sabrina Baker MRN: [id number] Date of Birth: 04/29/1941  Transition of Care [facility]) CM/SW Contact  [name], LCSW Phone Number: 03/22/2021, 1:23 PM  Clinical Narrative:  Submitted for insurance authorization through [facility] Access. Auth ID [id number].  3:41 pm: Talked with Elyse Hsu, admissions director at [facility] and informed her that insurance Barnes still pending. Per Elyse Hsu, if auth received today, patient will discharge tomorrow. 4:26 pm: Received call from Abigail Butts with [employer] that patient approved for ST rehab. Plan Auth ID: [id number] Cruise ID: [id number] Effective 10/11 - 10/13  Elyse Hsu will be contacted on Wednesday with authorization information and patient will discharge to [facility].   Expected Discharge Plan: Home/Self Care Barriers to Discharge: No Barriers Identified  Expected Discharge Plan and Services Expected Discharge Plan: Home/Self Care   Discharge Planning Services: CM Consult, NA Post Acute Care Choice: NA Living arrangements for the past 2 months: Single Family Home Expected Discharge Date: 03/22/21               DME Arranged: N/A DME Agency: NA       HH Arranged: NA HH Agency: NA         Social Determinants of Health (SDOH) Interventions    Readmission Risk Interventions No flowsheet data found.

## 2021-03-22 NOTE — Discharge Summary (Addendum)
Physician Discharge Summary  Sabrina Baker:[id number] DOB: Sep 11, 1940 DOA: 03/03/2021  PCP: [name], MD  Admit date: 03/03/2021  Discharge date:  03/23/2021.  Time spent: 40 minutes  Recommendations for Outpatient Follow-up:  Follow outpatient CBC/CMP Follow with ENT outpatient (call for follow up appt with Dr. Redmond Baseman) Continue diuresis with lasix - follow volume status, creatinine, lytes outpatient and adjust lasix as needed Desats to 88% with activity, borderline, continue 1 L supplemental oxygen with activity - follow outpatient, expect this probably will continue to improve with continued diuresis Follow with rheumatology for chronic gout  Follow with cardiology outpatient for mild to moderate AS, pericardial effusion (small), and heart failure  Discharge Diagnoses:  Active Problems:   Facial cellulitis   Acute idiopathic gout   Hypoxia   Acute diastolic congestive heart failure (HCC)   Pericardial effusion   Aortic valve stenosis   Discharge Condition: stable  Diet recommendation: heart healthy  Filed Weights   03/20/21 2136 03/21/21 0500 03/21/21 2032  Weight: 73 kg 72.7 kg 72.8 kg    History of present illness:  Sabrina Baker is a 80 y.o. female with a PMH significant for insulin-dependent type 2 diabetes, Charcot foot, CKD stage IIIb, A. 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  She presented with purulence from L cheek, swelling, and erythema.  She was noted to have cellulitis on imaging.  ENT and ID were consulted.  She was managed with antibiotics and has improved.   Hospitalization complicated by volume overloaded, which was managed with lasix.  Her hypoxia has improved, she desats to 88% with activity, which is improved from previously.  Will need follow up outpatient.  She'll discharge on lasix.  Hospitalization also complicated by gout.  Plan for outpatient rheumatology follow up.  See below for  additional details  Hospital Course:  Elevated Procal  Leukocytosis Afebrile, imaging concerning for possible pneumonia, but clinically appears to be improving with lasix Facial cellulitis continuing to improve Not sure what significance of this significantly elevated procal is -> improved on repeat Trend Follow chest imaging outpatient    Acute Hypoxic Respiratory Failure Elevated BNP, bilateral LE edema -> seems to be improving CXR with L basilar and potential R retrodiagphrgmatic airspace opacitiy CXR 10/9 with resolving HF (?superimposed multilobar pneumonia - does have slightly elevated WBC, but afebrile - will monitor off abx for now) She's improving, O2 desats to 88% with activity, will discharge with 1 L with activity, continue 40 mg lasix daily -> she needs outpatient repeat CXR and/or CT scan to follow chest imaging findigns Continue lasix as tolerated - developing alkalosis, follow in AM Echo with EF 60-65%, moderate LVH, IVC dilated with >50% resp variability    Mild to Moderate AS Will recommend outpatient cardiology follow up  Small Pericardial Effusion Follow with cardiology outpatient   Polyarthritis/acute gouty attack on right thumb and knees-patient declined home health PT and had worsening participation with PT and requested SNF placement.  Patient has been stable and had improvement in her knee pain.  She is awaiting SNF placement at this time. -Continue allopurinol, colchicine -Rheumatology OP - continue PRN oxy IR and tylenol - TOC consulted for SNF placement   Facial cellulitis/carbuncle- afebrile, improving.  Blood cultures negative. Patient has no complaints on her face.  Lesion looks to be drying, though still swollen.   -ID signed off      -s/p linezolid until 10/3 - ENT signed off -> follow  up with ENT outpatient  - WOC s/o - pain control PRN   AKI with Hyponatremia, hyperkalemia- resolved.  Patient has maintained baseline kidney function. - strict  I/O - BMP am   Insulin dependent Type 2 diabetes- Hgb A1c was 8.1 on admission. Takes trulicity at home as well as insulin. - continue sSSI - decreased home basal insulin to 10units daily am for history of fasting hypoglycemia this admission -> follow closely outpt - resume home DM meds -Continue atorvastatin   HTN- chronic, stable -Continue amlodipine, carvedilol   A. fib-rate controlled -Continue Eliquis -Continue Coreg   Anxiety/depression- -Continue duloxetine  Procedures: Echo IMPRESSIONS     1. Left ventricular ejection fraction, by estimation, is 60 to 65%. The  left ventricle has normal function. The left ventricle has no regional  wall motion abnormalities. There is moderate left ventricular hypertrophy.  Left ventricular diastolic  parameters are indeterminate.   2. Right ventricular systolic function is normal. The right ventricular  size is normal. Tricuspid regurgitation signal is inadequate for assessing  PA pressure.   3. A small pericardial effusion is present.   4. The mitral valve is degenerative. No evidence of mitral valve  regurgitation. Moderate mitral annular calcification.   5. The aortic valve was not well visualized. There is moderate  calcification of the aortic valve. Aortic valve regurgitation is not  visualized. Mild to moderate aortic valve stenosis. Vmax 2.3 m/s, MG 12  mmHg, AVA 1.5 cm^2, DI 0.45   6. The inferior vena cava is dilated in size with >50% respiratory  variability, suggesting right atrial pressure of 8 mmHg.   Consultations: ENT ID  Discharge Exam: Vitals:   03/22/21 1025 03/22/21 1113  BP:    Pulse:    Resp:    Temp:    SpO2: 93% 98%   [place] today  General: No acute distress. Cardiovascular: Heart sounds show a regular rate, and rhythm.  Lungs: Clear to auscultation bilaterally  Abdomen: Soft, nontender, nondistended  Neurological: Alert and oriented 3.  Moves all extremities 4 . Cranial nerves II through XII grossly intact. Skin: Warm and dry. No rashes or lesions. Extremities: No clubbing or cyanosis. No edema.   Discharge Instructions   Discharge Instructions     (HEART FAILURE PATIENTS) Call MD:  Anytime you have any of the following symptoms: 1) 3 pound weight gain in 24 hours or 5 pounds in 1 week 2) shortness of breath, with or without a dry hacking cough 3) swelling in the hands, feet or stomach 4) if you have to sleep on extra pillows at night in order to breathe.   Complete by: As directed    Call MD for:  difficulty breathing, headache or visual disturbances   Complete by: As directed    Call MD for:  extreme fatigue   Complete by: As directed    Call MD for:  hives   Complete by: As directed    Call MD for:  persistant dizziness or light-headedness   Complete by: As directed    Call MD for:  persistant nausea and vomiting   Complete by: As directed    Call MD for:  redness, tenderness, or signs of infection (pain, swelling, redness, odor or green/yellow discharge around incision site)   Complete by: As directed    Call MD for:  severe uncontrolled pain   Complete by: As directed    Call MD for:  temperature >100.4  Complete by: As directed    Diet - low sodium heart healthy   Complete by: As directed    Discharge instructions   Complete by: As directed    You were seen for an infection in your neck.  You've improved with antibiotics.  You should follow up with your doctors outpatient for your neck.  See ENT and your PCP regarding this.  You had a heart failure exacerbation here as well.  You've improved with lasix.  We'll discharge you with 40 mg lasix to continue daily.  Your oxygen level still drops a little with activity, but this should improve further with additional diuresis.  You can use 1 liter as needed with activity, but this should be temporary.  Follow up with your PCP to determine whether you need to  adjust the dose based on your exam.  Your echo showed some abnormal findings that should be followed up with cardiology.  You should follow up with cardiology for your heart failure, aortic stenosis, and pericardial effusions.  You need a repeat chest x ray and/or a CT scan with your PCP outpatient.  Return for new, recurrent, or worsening symptoms.  Please ask your PCP to request records from this hospitalization so they know what was done and what the next steps will be.   Increase activity slowly   Complete by: As directed    No wound care   Complete by: As directed       Allergies as of 03/22/2021       Reactions   Ramipril Anaphylaxis, Other (See Comments)   Other Other (See Comments)   Cortisone Itching, Rash   Niacin Diarrhea   And nausea   Niacin And Related Diarrhea   Valsartan Other (See Comments)   Hyperkalemia        Medication List     STOP taking these medications    bacitracin-polymyxin b ophthalmic ointment Commonly known as: POLYSPORIN   bevacizumab 400 MG/16ML Soln Commonly known as: AVASTIN   CALCIUM-MAGNESIUM-ZINC PO   cholecalciferol 1000 units tablet Commonly known as: VITAMIN D   Florastor 250 MG capsule Generic drug: saccharomyces boulardii   Magnesium 500 MG Caps   pantoprazole 40 MG tablet Commonly known as: PROTONIX       TAKE these medications    acetaminophen 650 MG CR tablet Commonly known as: TYLENOL Take 1,300 mg by mouth every 8 (eight) hours as needed for pain.   allopurinol 100 MG tablet Commonly known as: ZYLOPRIM Take 100 mg by mouth daily.   amLODipine 10 MG tablet Commonly known as: NORVASC Take 10 mg by mouth daily.   apixaban 5 MG Tabs tablet Commonly known as: ELIQUIS Take 1 tablet (5 mg total) by mouth 2 (two) times daily.   atorvastatin 10 MG tablet Commonly known as: LIPITOR Take 10 mg by mouth daily.   calcitRIOL 0.25 MCG capsule Commonly known as: ROCALTROL Take 0.25 mcg by mouth 3 (three)  times a week. Mon, Wed, Friday   calcium-vitamin D 500-200 MG-UNIT Tabs tablet Commonly known as: OSCAL WITH D Take 1 tablet by mouth daily.   carvedilol 25 MG tablet Commonly known as: COREG Take 25 mg by mouth 2 (two) times daily with a meal.   colchicine 0.6 MG tablet Take 0.5 tablets (0.3 mg total) by mouth daily. Started 04/07/13 What changed: how much to take   [employer] B-12 500 MCG tablet Generic drug: vitamin B-12 Take 1,000 mcg by mouth daily.   DULoxetine 60 MG  capsule Commonly known as: CYMBALTA Take 60 mg by mouth daily.   furosemide 40 MG tablet Commonly known as: Lasix Take 1 tablet (40 mg total) by mouth daily. What changed:  medication strength how much to take   gabapentin 100 MG capsule Commonly known as: NEURONTIN Take 200 mg by mouth at bedtime.   GlucoCom Blood Glucose Monitor Devi Use to test blood sugar 3 times a day (E11.65)   HYDROcodone-acetaminophen 5-325 MG tablet Commonly known as: NORCO/VICODIN Take 1 tablet by mouth every 6 (six) hours as needed for moderate pain.   Insulin Pen Needle 32G X 4 MM Misc USE TO INJECT INSULIN ONCE A DAY   Iron 325 (65 Fe) MG Tabs Take 1 tablet by mouth 2 (two) times daily.   ketoconazole 2 % shampoo Commonly known as: NIZORAL Apply 1 application topically 2 (two) times a week.   Lancets 30G Misc Use to test blood sugars 3 times a day (Dx: E11.65)   latanoprost 0.005 % ophthalmic solution Commonly known as: XALATAN Place 1 drop into the left eye at bedtime.   mupirocin cream 2 % Commonly known as: BACTROBAN Apply topically 2 (two) times daily. Apply to forehead wound until healed   nateglinide 120 MG tablet Commonly known as: STARLIX Take 120 mg by mouth 3 (three) times daily as needed. If CBG is above 130   OneTouch Verio test strip Generic drug: glucose blood USE AS DIRECTED. TESTING FREQUENCY  3 X/DAILY (DX E11.65)   Toujeo Max SoloStar 300 UNIT/ML Solostar Pen Generic drug: insulin  glargine (2 Unit Dial) Inject 10 Units into the skin daily. What changed: how much to take   triamcinolone cream 0.1 % Commonly known as: KENALOG Apply 1 application topically 2 (two) times daily as needed (For rash).   trimethoprim-polymyxin b ophthalmic solution Commonly known as: POLYTRIM Use 4 times in left eye the day of injection, 4 drop the following day.   Trulicity 1.5 DP/8.2UM Sopn Generic drug: Dulaglutide Inject 3 mg into the skin every 7 (seven) days. Friday       ASK your doctor about these medications    oxyCODONE 5 MG immediate release tablet Commonly known as: Oxy IR/ROXICODONE Take 1 tablet (5 mg total) by mouth every 6 (six) hours as needed for up to 3 days for moderate pain or breakthrough pain. Ask about: Should I take this medication?       Allergies  Allergen Reactions   Ramipril Anaphylaxis and Other (See Comments)   Other Other (See Comments)   Cortisone Itching and Rash   Niacin Diarrhea    And nausea   Niacin And Related Diarrhea   Valsartan Other (See Comments)    Hyperkalemia     Follow-up Information     Melida Quitter, MD. Schedule an appointment as soon as possible for a visit in 1 week(s).   Specialty: Otolaryngology Contact information: [address] [place] [address] [phone]                  The results of significant diagnostics from this hospitalization (including imaging, microbiology, ancillary and laboratory) are listed below for reference.    Significant Diagnostic Studies: MR ABDOMEN WO CONTRAST  Result Date: 02/21/2021 CLINICAL DATA:  Follow-up renal lesion EXAM: MRI ABDOMEN WITHOUT CONTRAST TECHNIQUE: Multiplanar multisequence MR imaging was performed without the administration of intravenous contrast. COMPARISON:  Renal ultrasound dated 08/04/2020 FINDINGS: Severely motion degraded images. Lower chest: Lung bases are clear. Hepatobiliary: Liver is  within normal limits. Gallbladder is  unremarkable. No intrahepatic or extrahepatic ductal dilatation. Pancreas:  Within normal limits. Spleen:  Within normal limits. Adrenals/Urinary Tract:  Adrenal glands are within normal limits. 12 x 17 mm cyst in the anterior left upper kidney (series 7/image 19). Markedly motion degraded precontrast T1 imaging, limiting assessment for hemorrhage. Post-contrast imaging was not performed. While this favors a relatively simple cyst, this is poorly evaluated on the current study. Mild scarring in the posteromedial left upper kidney (series 7/image 20), at the site of prior hemorrhagic cyst on CT. Tiny bilateral renal cysts. No hydronephrosis. Stomach/Bowel: Stomach is notable for a moderate hiatal hernia. Visualized bowel is grossly unremarkable. Vascular/Lymphatic:  No evidence of abdominal aortic aneurysm. No suspicious abdominal lymphadenopathy. Other:  No abdominal ascites. Musculoskeletal: Mild degenerative changes of the visualized thoracolumbar spine. IMPRESSION: Limited evaluation due to motion degradation and lack of intravenous contrast administration. 17 mm predominantly simple cyst in the anterior left upper kidney, although poorly evaluated. This is not considered suspicious for neoplasm and it is unclear that additional follow-up imaging is warranted given the patient's age. However, if follow-up is desired, please note that MR is unlikely to be beneficial given the motion degradation. Additional ancillary findings as above. Electronically Signed   By: Julian Hy M.D.   On: 02/21/2021 20:41   DG CHEST PORT 1 VIEW  Result Date: 03/20/2021 CLINICAL DATA:  80 year old female with history of hypoxia. EXAM: PORTABLE CHEST 1 VIEW COMPARISON:  Chest x-ray 03/17/2021. FINDINGS: Lung volumes are normal. Opacity in the medial aspect of the left base which may reflect atelectasis and/or consolidation. Decreasing small bilateral pleural effusions. Diffuse peribronchial cuffing with widespread interstitial  prominence and patchy ill-defined opacities in the lungs bilaterally, somewhat asymmetrically distributed, most severe in the apex of the right lung. No pneumothorax. Cephalization of the pulmonary vasculature. Mild cardiomegaly. Upper mediastinal contours are within normal limits. Atherosclerotic calcifications in the thoracic aorta. IMPRESSION: 1. The appearance the chest does suggest resolving congestive heart failure, as above. However, given the asymmetry of the pulmonary opacities, the possibility of superimposed multilobar bronchopneumonia is not excluded. 2. Aortic atherosclerosis. Electronically Signed   By: Vinnie Langton M.D.   On: 03/20/2021 08:33   DG CHEST PORT 1 VIEW  Result Date: 03/17/2021 CLINICAL DATA:  Hypoxia EXAM: PORTABLE CHEST 1 VIEW COMPARISON:  03/16/2021 FINDINGS: Mild cardiomegaly, stable. Atherosclerotic calcification of the aortic knob. Pulmonary vascular congestion with patchy bibasilar opacities. Small bilateral pleural effusions. No pneumothorax. IMPRESSION: Findings suggestive of CHF with pulmonary edema and small bilateral pleural effusions. Overall, little interval change from prior. Electronically Signed   By: Davina Poke D.O.   On: 03/17/2021 10:32   DG CHEST PORT 1 VIEW  Result Date: 03/16/2021 CLINICAL DATA:  Hypoxia.  Hypertension. EXAM: PORTABLE CHEST 1 VIEW COMPARISON:  10/04/2018 FINDINGS: Low lung volumes are present, causing crowding of the pulmonary vasculature. Bilateral indistinctness of the pulmonary vasculature with mild interstitial accentuation. Mild retrocardiac opacity suspicious for left lower lobe atelectasis or pneumonia. There is also some accentuated density along the right diaphragm and right lower lobe airspace opacity in the retro diaphragmatic region cannot be excluded. Thoracolumbar scoliosis and spondylosis. Borderline enlargement of the cardiopericardial silhouette. Suspected arthropathy of the right glenohumeral joint. IMPRESSION: 1.  Left basilar and potential right retro diaphragmatic airspace opacity. Lateral radiography may be helpful in further characterization if clinically feasible. 2. Chronic low lung volumes and interstitial accentuation. 3. Borderline enlargement of the cardiopericardial silhouette. 4. Thoracolumbar scoliosis and  spondylosis. 5. Right glenohumeral arthropathy. Electronically Signed   By: Van Clines M.D.   On: 03/16/2021 13:19   ECHOCARDIOGRAM COMPLETE  Result Date: 03/19/2021    ECHOCARDIOGRAM REPORT   Patient Name:   CALLE SCHADER Date of Exam: 03/19/2021 Medical Rec #:  [id number]      Height:       63.0 in Accession #:    [id number]     Weight:       173.9 lb Date of Birth:  12/27/1940      BSA:          1.822 m Patient Age:    24 years       BP:           154/62 mmHg Patient Gender: F              HR:           73 bpm. Exam Location:  Inpatient Procedure: 2D Echo Indications:    congestive heart failure  History:        Patient has prior history of Echocardiogram examinations, most                 recent 12/27/2018.  Sonographer:    Johny Chess RDCS Referring Phys: [place]  1. Left ventricular ejection fraction, by estimation, is 60 to 65%. The left ventricle has normal function. The left ventricle has no regional wall motion abnormalities. There is moderate left ventricular hypertrophy. Left ventricular diastolic parameters are indeterminate.  2. Right ventricular systolic function is normal. The right ventricular size is normal. Tricuspid regurgitation signal is inadequate for assessing PA pressure.  3. A small pericardial effusion is present.  4. The mitral valve is degenerative. No evidence of mitral valve regurgitation. Moderate mitral annular calcification.  5. The aortic valve was not well visualized. There is moderate calcification of the aortic valve. Aortic valve regurgitation is not visualized. Mild to moderate aortic valve stenosis. Vmax 2.3 m/s, MG 12  mmHg, AVA 1.5 cm^2, DI 0.45  6. The inferior vena cava is dilated in size with >50% respiratory variability, suggesting right atrial pressure of 8 mmHg. FINDINGS  Left Ventricle: Left ventricular ejection fraction, by estimation, is 60 to 65%. The left ventricle has normal function. The left ventricle has no regional wall motion abnormalities. The left ventricular internal cavity size was normal in size. There is  moderate left ventricular hypertrophy. Left ventricular diastolic parameters are indeterminate. Right Ventricle: The right ventricular size is normal. No increase in right ventricular wall thickness. Right ventricular systolic function is normal. Tricuspid regurgitation signal is inadequate for assessing PA pressure. Left Atrium: Left atrial size was normal in size. Right Atrium: Right atrial size was normal in size. Pericardium: A small pericardial effusion is present. Mitral Valve: The mitral valve is degenerative in appearance. Moderate mitral annular calcification. No evidence of mitral valve regurgitation. Tricuspid Valve: The tricuspid valve is normal in structure. Tricuspid valve regurgitation is trivial. Aortic Valve: The aortic valve was not well visualized. There is moderate calcification of the aortic valve. Aortic valve regurgitation is not visualized. Mild to moderate aortic stenosis is present. Aortic valve mean gradient measures 12.0 mmHg. Aortic valve peak gradient measures 21.7 mmHg. Pulmonic Valve: The pulmonic valve was not well visualized. Pulmonic valve regurgitation is not visualized. Aorta: The aortic root and ascending aorta are structurally normal, with no evidence of dilitation. Venous: The inferior vena cava is dilated in size with greater than  50% respiratory variability, suggesting right atrial pressure of 8 mmHg. IAS/Shunts: The interatrial septum was not well visualized.  LEFT VENTRICLE PLAX 2D LVIDd:         4.30 cm Diastology LVIDs:         2.70 cm LV e' medial:    7.94  cm/s LV PW:         0.90 cm LV E/e' medial:  15.4 LV IVS:        1.10 cm LV e' lateral:   6.31 cm/s                        LV E/e' lateral: 19.3  RIGHT VENTRICLE             IVC RV S prime:     15.00 cm/s  IVC diam: 2.10 cm TAPSE (M-mode): 2.0 cm LEFT ATRIUM           Index        RIGHT ATRIUM           Index LA diam:      3.70 cm 2.03 cm/m   RA Area:     13.20 cm LA Vol (A4C): 49.4 ml 27.11 ml/m  RA Volume:   29.10 ml  15.97 ml/m  AORTIC VALVE AV Vmax:           233.00 cm/s AV Vmean:          159.000 cm/s AV VTI:            0.491 m AV Peak Grad:      21.7 mmHg AV Mean Grad:      12.0 mmHg LVOT Vmax:         95.50 cm/s LVOT Vmean:        63.900 cm/s LVOT VTI:          0.215 m LVOT/AV VTI ratio: 0.44  AORTA Ao Asc diam: 3.10 cm MITRAL VALVE MV Area (PHT): 3.72 cm     SHUNTS MV Decel Time: 204 msec     Systemic VTI: 0.22 m MV E velocity: 122.00 cm/s MV A velocity: 139.00 cm/s MV E/A ratio:  0.88 Oswaldo Milian MD Electronically signed by Oswaldo Milian MD Signature Date/Time: 03/19/2021/4:47:41 PM    Final    CT MAXILLOFACIAL WO CONTRAST  Result Date: 03/04/2021 CLINICAL DATA:  Facial wound and abscess. EXAM: CT MAXILLOFACIAL WITHOUT CONTRAST TECHNIQUE: Multidetector CT imaging of the maxillofacial structures was performed. Multiplanar CT image reconstructions were also generated. COMPARISON:  Head CT dated 11/17/2019. FINDINGS: Osseous: No fracture or mandibular dislocation. No destructive process. Orbits: The globes and retro-orbital fat are preserved. Right scleral banding. Sinuses: Clear. Soft tissues: There is diffuse skin thickening and soft tissue swelling of the left side of the face in the region of the left parotid gland with thickening of the left earlobe. No drainable fluid collection or abscess. Several top-normal left cervical lymph nodes, likely reactive. Limited intracranial: Mild degenerative changes.  No acute findings. IMPRESSION: Left facial cellulitis versus parotiditis.  Clinical correlation is recommended. No drainable fluid collection or abscess. Electronically Signed   By: Anner Crete M.D.   On: 03/04/2021 01:31    Microbiology: Recent Results (from the past 240 hour(s))  Resp Panel by RT-PCR (Flu A&B, Covid) Nasopharyngeal Swab     Status: None   Collection Time: 03/15/21  2:39 PM   Specimen: Nasopharyngeal Swab; Nasopharyngeal(NP) swabs in vial transport medium  Result Value Ref Range Status  SARS Coronavirus 2 by RT PCR NEGATIVE NEGATIVE Final    Comment: (NOTE) SARS-CoV-2 target nucleic acids are NOT DETECTED.  The SARS-CoV-2 RNA is generally detectable in upper respiratory specimens during the acute phase of infection. The lowest concentration of SARS-CoV-2 viral copies this assay can detect is 138 copies/mL. A negative result does not preclude SARS-Cov-2 infection and should not be used as the sole basis for treatment or other patient management decisions. A negative result may occur with  improper specimen collection/handling, submission of specimen other than nasopharyngeal swab, presence of viral mutation(s) within the areas targeted by this assay, and inadequate number of viral copies(<138 copies/mL). A negative result must be combined with clinical observations, patient history, and epidemiological information. The expected result is Negative.  Fact Sheet for Patients:  EntrepreneurPulse.com.au  Fact Sheet for Healthcare Providers:  IncredibleEmployment.be  This test is no t yet approved or cleared by the [place] FDA and  has been authorized for detection and/or diagnosis of SARS-CoV-2 by FDA under an Emergency Use Authorization (EUA). This EUA will remain  in effect (meaning this test can be used) for the duration of the COVID-19 declaration under Section 564(b)(1) of the Act, 21 U.S.C.section 360bbb-3(b)(1), unless the authorization is terminated  or revoked sooner.        Influenza A by PCR NEGATIVE NEGATIVE Final   Influenza B by PCR NEGATIVE NEGATIVE Final    Comment: (NOTE) The Xpert Xpress SARS-CoV-2/FLU/RSV plus assay is intended as an aid in the diagnosis of influenza from Nasopharyngeal swab specimens and should not be used as a sole basis for treatment. Nasal washings and aspirates are unacceptable for Xpert Xpress SARS-CoV-2/FLU/RSV testing.  Fact Sheet for Patients: EntrepreneurPulse.com.au  Fact Sheet for Healthcare Providers: IncredibleEmployment.be  This test is not yet approved or cleared by the [place] FDA and has been authorized for detection and/or diagnosis of SARS-CoV-2 by FDA under an Emergency Use Authorization (EUA). This EUA will remain in effect (meaning this test can be used) for the duration of the COVID-19 declaration under Section 564(b)(1) of the Act, 21 U.S.C. section 360bbb-3(b)(1), unless the authorization is terminated or revoked.  Performed at [facility], [place] [address], [place], [place] [address]      Labs: Basic Metabolic Panel: Recent Labs  Lab 03/18/21 0931 03/19/21 0256 03/20/21 0406 03/21/21 0356 03/22/21 0354  NA 135 136 137 139 139  K 4.4 3.8 4.1 4.0 4.5  CL 98 97* 95* 96* 96*  CO2 31 33* 35* 34* 35*  GLUCOSE 196* 116* 172* 167* 137*  BUN 16 14 10 12 16   CREATININE 1.16* 1.03* 0.92 0.98 1.15*  CALCIUM 8.2* 8.3* 8.3* 8.3* 8.3*  MG  --  1.4* 1.8 1.7 1.8  PHOS  --  3.3 3.3 4.0 5.6*   Liver Function Tests: Recent Labs  Lab 03/18/21 0931 03/19/21 0256 03/20/21 0406 03/21/21 0356 03/22/21 0354  AST 18 17 21 18 20   ALT 15 15 18 15 17   ALKPHOS 51 49 55 56 64  BILITOT 0.4 0.5 0.8 0.8 0.5  PROT 5.1* 5.6* 5.6* 5.7* 5.6*  ALBUMIN 2.4* 2.7* 2.8* 2.8* 2.9*   No results for input(s): LIPASE, AMYLASE in the last 168 hours. No results for input(s): AMMONIA in the last 168 hours. CBC: Recent Labs  Lab 03/18/21 0931 03/19/21 0256 03/20/21 0406  03/21/21 0356 03/22/21 0354  WBC 13.8* 13.5* 14.3* 13.5* 10.4  NEUTROABS  --  9.4* 10.2* 9.7* 7.0  HGB 8.7* 8.8* 9.0*  9.8* 11.1*  HCT 27.6* 27.8* 28.0* 31.1* 34.1*  MCV 99.6 98.2 96.9 97.8 96.6  PLT 261 295 305 362 393   Cardiac Enzymes: No results for input(s): CKTOTAL, CKMB, CKMBINDEX, TROPONINI in the last 168 hours. BNP: BNP (last 3 results) Recent Labs    03/17/21 0821 03/19/21 0256 03/22/21 0354  BNP 239.7* 238.7* 100.3*    ProBNP (last 3 results) No results for input(s): PROBNP in the last 8760 hours.  CBG: Recent Labs  Lab 03/21/21 1659 03/21/21 2127 03/22/21 0625 03/22/21 0639 03/22/21 1124  GLUCAP 116* 244* 171* 174* 205*       Signed:  Fayrene Helper MD.  [facility] 03/22/2021, 12:41 PM

## 2021-03-22 NOTE — Progress Notes (Signed)
SATURATION QUALIFICATIONS: (This note is used to comply with regulatory documentation for home oxygen)  Patient Saturations on Room Air at Rest = 93%  Patient Saturations on Room Air while Ambulating =88%  Patient Saturations on 1 Liters of oxygen while Ambulating = 100 %  Please briefly explain why patient needs home oxygen: Patient' air saturation fluctuates low 93-88% between resting and ambulating . Ambulated with nasal cannula @ 1 L and O2 Saturation came back to 100% with deep breathing exercise too. O2 as PRN will help.

## 2021-03-22 NOTE — Plan of Care (Signed)
  Problem: Education: Goal: Knowledge of General Education information will improve Description: Including pain rating scale, medication(s)/side effects and non-pharmacologic comfort measures Outcome: Progressing   Problem: Clinical Measurements: Goal: Will remain free from infection Outcome: Progressing Goal: Respiratory complications will improve Outcome: Progressing   Problem: Coping: Goal: Level of anxiety will decrease Outcome: Progressing

## 2021-03-23 DIAGNOSIS — E1122 Type 2 diabetes mellitus with diabetic chronic kidney disease: Secondary | ICD-10-CM | POA: Diagnosis not present

## 2021-03-23 DIAGNOSIS — E1140 Type 2 diabetes mellitus with diabetic neuropathy, unspecified: Secondary | ICD-10-CM | POA: Diagnosis not present

## 2021-03-23 DIAGNOSIS — L03211 Cellulitis of face: Secondary | ICD-10-CM | POA: Diagnosis not present

## 2021-03-23 DIAGNOSIS — L0293 Carbuncle, unspecified: Secondary | ICD-10-CM | POA: Diagnosis not present

## 2021-03-23 DIAGNOSIS — R0902 Hypoxemia: Secondary | ICD-10-CM | POA: Diagnosis not present

## 2021-03-23 DIAGNOSIS — R531 Weakness: Secondary | ICD-10-CM | POA: Diagnosis not present

## 2021-03-23 DIAGNOSIS — N183 Chronic kidney disease, stage 3 unspecified: Secondary | ICD-10-CM | POA: Diagnosis not present

## 2021-03-23 DIAGNOSIS — J9691 Respiratory failure, unspecified with hypoxia: Secondary | ICD-10-CM | POA: Diagnosis not present

## 2021-03-23 DIAGNOSIS — M109 Gout, unspecified: Secondary | ICD-10-CM | POA: Diagnosis not present

## 2021-03-23 DIAGNOSIS — M6281 Muscle weakness (generalized): Secondary | ICD-10-CM | POA: Diagnosis not present

## 2021-03-23 DIAGNOSIS — L0390 Cellulitis, unspecified: Secondary | ICD-10-CM | POA: Diagnosis not present

## 2021-03-23 DIAGNOSIS — E1165 Type 2 diabetes mellitus with hyperglycemia: Secondary | ICD-10-CM | POA: Diagnosis not present

## 2021-03-23 DIAGNOSIS — R2681 Unsteadiness on feet: Secondary | ICD-10-CM | POA: Diagnosis not present

## 2021-03-23 DIAGNOSIS — R5381 Other malaise: Secondary | ICD-10-CM | POA: Diagnosis not present

## 2021-03-23 DIAGNOSIS — R739 Hyperglycemia, unspecified: Secondary | ICD-10-CM | POA: Diagnosis not present

## 2021-03-23 DIAGNOSIS — I5033 Acute on chronic diastolic (congestive) heart failure: Secondary | ICD-10-CM | POA: Diagnosis not present

## 2021-03-23 DIAGNOSIS — I480 Paroxysmal atrial fibrillation: Secondary | ICD-10-CM | POA: Diagnosis not present

## 2021-03-23 DIAGNOSIS — R0981 Nasal congestion: Secondary | ICD-10-CM | POA: Diagnosis not present

## 2021-03-23 DIAGNOSIS — K219 Gastro-esophageal reflux disease without esophagitis: Secondary | ICD-10-CM | POA: Diagnosis not present

## 2021-03-23 DIAGNOSIS — Z743 Need for continuous supervision: Secondary | ICD-10-CM | POA: Diagnosis not present

## 2021-03-23 DIAGNOSIS — I10 Essential (primary) hypertension: Secondary | ICD-10-CM | POA: Diagnosis not present

## 2021-03-23 DIAGNOSIS — E785 Hyperlipidemia, unspecified: Secondary | ICD-10-CM | POA: Diagnosis not present

## 2021-03-23 DIAGNOSIS — E875 Hyperkalemia: Secondary | ICD-10-CM | POA: Diagnosis not present

## 2021-03-23 DIAGNOSIS — D649 Anemia, unspecified: Secondary | ICD-10-CM | POA: Diagnosis not present

## 2021-03-23 LAB — GLUCOSE, CAPILLARY
Glucose-Capillary: 155 mg/dL — ABNORMAL HIGH (ref 70–99)
Glucose-Capillary: 163 mg/dL — ABNORMAL HIGH (ref 70–99)

## 2021-03-23 NOTE — Progress Notes (Signed)
PROGRESS NOTE    Sabrina Baker  YQM:[id number] DOB: 07-19-1940 DOA: 03/03/2021 PCP: [name], MD   Brief Narrative:  This 80 y.o. female with PMH significant for insulin-dependent type 2 diabetes, Charcot foot, CKD stage IIIb, A. 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 She  presented with purulence from L cheek, swelling, and erythema.  She was noted to have cellulitis on imaging.  ENT and ID were consulted.  She was managed with antibiotics and has improved.  Hospitalization complicated by volume overloaded, which was managed with lasix.  Her hypoxia has improved, she desats to 88% with activity, which is improved from previously. Needs follow up outpatient. She will be discharged on lasix. Hospitalization also complicated by gout.  Plan for outpatient rheumatology follow up.  Patient is being discharged to skilled nursing facility pending insurance.  Assessment & Plan:   Active Problems:   Facial cellulitis   Acute idiopathic gout   Hypoxia   Acute diastolic congestive heart failure (HCC)   Pericardial effusion   Aortic valve stenosis   Leukocytosis with elevated procalcitonin: Patient remains afebrile, imaging concerning for possible pneumonia, but clinically improved with lasix. Facial cellulitis continues to improve Not sure what significance of this significantly elevated procal is -> improved on repeat Follow chest imaging outpatient.   Acute hypoxic respiratory failure: She is found to have elevated BNP, bilateral LE edema -> seems to be improving. CXR with L basilar and potential R retrodiagphrgmatic airspace opacity. CXR 10/9 with resolving HF (?superimposed multilobar pneumonia - does have slightly elevated WBC, but afebrile - will monitor off abx for now) Patient is significantly improved.  O2 desats to 88% with activity, will discharge with 1 L with activity,  Continue 40 mg lasix daily -> She needs outpatient  repeat CXR and/or CT scan to follow chest imaging findings. Echo with EF 60-65%, moderate LVH, IVC dilated with >50% resp variability    Mild to Moderate AS Recommend outpatient cardiology follow-up.   Small Pericardial Effusion Follow with cardiology outpatient    Polyarthritis/acute gouty attack on right thumb and knees: Patient has declined home health PT and had worsening participation with PT and requested SNF placement.   Patient has been stable and had improvement in her knee pain.  She is awaiting SNF placement at this time. Continue allopurinol, colchicine Rheumatology OP Continue PRN oxy IR and tylenol TOC consulted for SNF placement.    Facial cellulitis/carbuncle: Remains afebrile, improving.  Blood cultures negative.  Patient has no complaints on her face.  Lesion looks to be drying, though still swollen.   ID signed off. Completed linezolid until 10/3 ENT signed off -> follow up with ENT outpatient . WOC s/o pain control PRN.   AKI / Hyponatremia / Hypokalemia : Resolved.   Patient has maintained baseline kidney function.  Insulin dependent Type 2 diabetes-  Hgb A1c was 8.1 on admission. Takes trulicity at home as well as insulin. Continue sSSI Decreased home basal insulin to 10units daily am for history of fasting hypoglycemia this admission -> follow closely outpt resume home DM meds Continue atorvastatin   HTN- chronic, stable Continue amlodipine, carvedilol   A. fib-rate controlled Continue Eliquis. Continue Coreg.   Anxiety/depression- Continue duloxetine.   DVT prophylaxis: Lovenox Code Status: Full code. Family Communication:No family at bed side. Disposition Plan:   Status is: Inpatient  Remains inpatient appropriate because:Inpatient level of care appropriate due to severity of illness  Dispo:  Patient From:  Planned Disposition: [facility]  Medically stable for discharge:   yes.     Consultants:   ENT ID  Procedures:   Antimicrobials:   Anti-infectives (From admission, onward)    Start     Dose/Rate Route Frequency Ordered Stop   03/13/21 0000  linezolid (ZYVOX) 600 MG tablet  Status:  Discontinued       Note to Pharmacy: Stop date of 03/13/21   600 mg Oral 2 times daily 03/13/21 1005 03/16/21    03/08/21 0000  linezolid (ZYVOX) 600 MG tablet  Status:  Discontinued       Note to Pharmacy: Stop date of 03/13/21   600 mg Oral 2 times daily 03/08/21 1135 03/13/21    03/04/21 2200  cefTRIAXone (ROCEPHIN) 2 g in sodium chloride 0.9 % 100 mL IVPB  Status:  Discontinued        2 g 200 mL/hr over 30 Minutes Intravenous Every 24 hours 03/04/21 0327 03/04/21 1444   03/04/21 1600  valACYclovir (VALTREX) tablet 1,000 mg  Status:  Discontinued        1,000 mg Oral 3 times daily 03/04/21 1444 03/04/21 1446   03/04/21 1500  valACYclovir (VALTREX) tablet 1,000 mg  Status:  Discontinued        1,000 mg Oral Daily 03/04/21 1446 03/08/21 1049   03/04/21 0300  linezolid (ZYVOX) tablet 600 mg        600 mg Oral Every 12 hours 03/04/21 0245 03/13/21 2149   03/04/21 0230  cefTRIAXone (ROCEPHIN) 2 g in sodium chloride 0.9 % 100 mL IVPB        2 g 200 mL/hr over 30 Minutes Intravenous  Once 03/04/21 0218 03/04/21 0309       Subjective: Patient was seen and examined at bedside.  Overnight events noted.  Patient reports feeling much improved.  Patient is being discharged to skilled nursing facility today.  Objective: Vitals:   03/22/21 1800 03/22/21 2015 03/23/21 0445 03/23/21 0938  BP: 117/66 140/76 (!) 164/81 131/62  Pulse: 74 72 76 89  Resp: 14 18 18 19   Temp: 98.4 F (36.9 C) 99 F (37.2 C) 99.5 F (37.5 C) 98.2 F (36.8 C)  TempSrc: Oral Oral Oral   SpO2: 97% 92% 91% 95%  Weight:      Height:        Intake/Output Summary (Last 24 hours) at 03/23/2021 1234 Last data filed at 03/23/2021 0900 Gross per 24 hour  Intake 900 ml  Output --  Net 900 ml   Filed Weights   03/20/21  2136 03/21/21 0500 03/21/21 2032  Weight: 73 kg 72.7 kg 72.8 kg    Examination:  General exam: Appears comfortable, not in any acute distress.  Deconditioned Respiratory system: Clear to auscultation. Respiratory effort normal. Cardiovascular system: S1-S2 heard, regular rate and rhythm, murmur++ Gastrointestinal system: Abdomen is soft, nontender, nondistended.  BS+. Central nervous system: Alert and oriented X 3. No focal neurological deficits. Extremities: No edema, no cyanosis, no clubbing. Skin: No rashes, lesions or ulcers Psychiatry: Judgement and insight appear normal. Mood & affect appropriate.     Data Reviewed: I have personally reviewed following labs and imaging studies  CBC: Recent Labs  Lab 03/18/21 0931 03/19/21 0256 03/20/21 0406 03/21/21 0356 03/22/21 0354  WBC 13.8* 13.5* 14.3* 13.5* 10.4  NEUTROABS  --  9.4* 10.2* 9.7* 7.0  HGB 8.7* 8.8* 9.0* 9.8* 11.1*  HCT 27.6* 27.8* 28.0* 31.1* 34.1*  MCV 99.6 98.2 96.9 97.8 96.6  PLT  261 295 305 362 458   Basic Metabolic Panel: Recent Labs  Lab 03/18/21 0931 03/19/21 0256 03/20/21 0406 03/21/21 0356 03/22/21 0354  NA 135 136 137 139 139  K 4.4 3.8 4.1 4.0 4.5  CL 98 97* 95* 96* 96*  CO2 31 33* 35* 34* 35*  GLUCOSE 196* 116* 172* 167* 137*  BUN 16 14 10 12 16   CREATININE 1.16* 1.03* 0.92 0.98 1.15*  CALCIUM 8.2* 8.3* 8.3* 8.3* 8.3*  MG  --  1.4* 1.8 1.7 1.8  PHOS  --  3.3 3.3 4.0 5.6*   GFR: Estimated Creatinine Clearance: 37.9 mL/min (A) (by C-G formula based on SCr of 1.15 mg/dL (H)). Liver Function Tests: Recent Labs  Lab 03/18/21 0931 03/19/21 0256 03/20/21 0406 03/21/21 0356 03/22/21 0354  AST 18 17 21 18 20   ALT 15 15 18 15 17   ALKPHOS 51 49 55 56 64  BILITOT 0.4 0.5 0.8 0.8 0.5  PROT 5.1* 5.6* 5.6* 5.7* 5.6*  ALBUMIN 2.4* 2.7* 2.8* 2.8* 2.9*   No results for input(s): LIPASE, AMYLASE in the last 168 hours. No results for input(s): AMMONIA in the last 168 hours. Coagulation  Profile: No results for input(s): INR, PROTIME in the last 168 hours. Cardiac Enzymes: No results for input(s): CKTOTAL, CKMB, CKMBINDEX, TROPONINI in the last 168 hours. BNP (last 3 results) No results for input(s): PROBNP in the last 8760 hours. HbA1C: No results for input(s): HGBA1C in the last 72 hours. CBG: Recent Labs  Lab 03/22/21 1124 03/22/21 1647 03/22/21 2005 03/23/21 0636 03/23/21 1137  GLUCAP 205* 116* 132* 155* 163*   Lipid Profile: No results for input(s): CHOL, HDL, LDLCALC, TRIG, CHOLHDL, LDLDIRECT in the last 72 hours. Thyroid Function Tests: No results for input(s): TSH, T4TOTAL, FREET4, T3FREE, THYROIDAB in the last 72 hours. Anemia Panel: No results for input(s): VITAMINB12, FOLATE, FERRITIN, TIBC, IRON, RETICCTPCT in the last 72 hours. Sepsis Labs: Recent Labs  Lab 03/21/21 0356 03/22/21 0354  PROCALCITON 34.42 0.10    Recent Results (from the past 240 hour(s))  Resp Panel by RT-PCR (Flu A&B, Covid) Nasopharyngeal Swab     Status: None   Collection Time: 03/15/21  2:39 PM   Specimen: Nasopharyngeal Swab; Nasopharyngeal(NP) swabs in vial transport medium  Result Value Ref Range Status   SARS Coronavirus 2 by RT PCR NEGATIVE NEGATIVE Final    Comment: (NOTE) SARS-CoV-2 target nucleic acids are NOT DETECTED.  The SARS-CoV-2 RNA is generally detectable in upper respiratory specimens during the acute phase of infection. The lowest concentration of SARS-CoV-2 viral copies this assay can detect is 138 copies/mL. A negative result does not preclude SARS-Cov-2 infection and should not be used as the sole basis for treatment or other patient management decisions. A negative result may occur with  improper specimen collection/handling, submission of specimen other than nasopharyngeal swab, presence of viral mutation(s) within the areas targeted by this assay, and inadequate number of viral copies(<138 copies/mL). A negative result must be combined  with clinical observations, patient history, and epidemiological information. The expected result is Negative.  Fact Sheet for Patients:  EntrepreneurPulse.com.au  Fact Sheet for Healthcare Providers:  IncredibleEmployment.be  This test is no t yet approved or cleared by the [place] FDA and  has been authorized for detection and/or diagnosis of SARS-CoV-2 by FDA under an Emergency Use Authorization (EUA). This EUA will remain  in effect (meaning this test can be used) for the duration of the COVID-19 declaration under Section 564(b)(1) of the  Act, 21 U.S.C.section 360bbb-3(b)(1), unless the authorization is terminated  or revoked sooner.       Influenza A by PCR NEGATIVE NEGATIVE Final   Influenza B by PCR NEGATIVE NEGATIVE Final    Comment: (NOTE) The Xpert Xpress SARS-CoV-2/FLU/RSV plus assay is intended as an aid in the diagnosis of influenza from Nasopharyngeal swab specimens and should not be used as a sole basis for treatment. Nasal washings and aspirates are unacceptable for Xpert Xpress SARS-CoV-2/FLU/RSV testing.  Fact Sheet for Patients: EntrepreneurPulse.com.au  Fact Sheet for Healthcare Providers: IncredibleEmployment.be  This test is not yet approved or cleared by the [place] FDA and has been authorized for detection and/or diagnosis of SARS-CoV-2 by FDA under an Emergency Use Authorization (EUA). This EUA will remain in effect (meaning this test can be used) for the duration of the COVID-19 declaration under Section 564(b)(1) of the Act, 21 U.S.C. section 360bbb-3(b)(1), unless the authorization is terminated or revoked.  Performed at [facility], [place] [address], [place], [place] [address]   Resp Panel by RT-PCR (Flu A&B, Covid) Nasopharyngeal Swab     Status: None   Collection Time: 03/22/21  6:08 PM   Specimen: Nasopharyngeal Swab; Nasopharyngeal(NP) swabs in vial  transport medium  Result Value Ref Range Status   SARS Coronavirus 2 by RT PCR NEGATIVE NEGATIVE Final    Comment: (NOTE) SARS-CoV-2 target nucleic acids are NOT DETECTED.  The SARS-CoV-2 RNA is generally detectable in upper respiratory specimens during the acute phase of infection. The lowest concentration of SARS-CoV-2 viral copies this assay can detect is 138 copies/mL. A negative result does not preclude SARS-Cov-2 infection and should not be used as the sole basis for treatment or other patient management decisions. A negative result may occur with  improper specimen collection/handling, submission of specimen other than nasopharyngeal swab, presence of viral mutation(s) within the areas targeted by this assay, and inadequate number of viral copies(<138 copies/mL). A negative result must be combined with clinical observations, patient history, and epidemiological information. The expected result is Negative.  Fact Sheet for Patients:  EntrepreneurPulse.com.au  Fact Sheet for Healthcare Providers:  IncredibleEmployment.be  This test is no t yet approved or cleared by the [place] FDA and  has been authorized for detection and/or diagnosis of SARS-CoV-2 by FDA under an Emergency Use Authorization (EUA). This EUA will remain  in effect (meaning this test can be used) for the duration of the COVID-19 declaration under Section 564(b)(1) of the Act, 21 U.S.C.section 360bbb-3(b)(1), unless the authorization is terminated  or revoked sooner.       Influenza A by PCR NEGATIVE NEGATIVE Final   Influenza B by PCR NEGATIVE NEGATIVE Final    Comment: (NOTE) The Xpert Xpress SARS-CoV-2/FLU/RSV plus assay is intended as an aid in the diagnosis of influenza from Nasopharyngeal swab specimens and should not be used as a sole basis for treatment. Nasal washings and aspirates are unacceptable for Xpert Xpress SARS-CoV-2/FLU/RSV testing.  Fact  Sheet for Patients: EntrepreneurPulse.com.au  Fact Sheet for Healthcare Providers: IncredibleEmployment.be  This test is not yet approved or cleared by the [place] FDA and has been authorized for detection and/or diagnosis of SARS-CoV-2 by FDA under an Emergency Use Authorization (EUA). This EUA will remain in effect (meaning this test can be used) for the duration of the COVID-19 declaration under Section 564(b)(1) of the Act, 21 U.S.C. section 360bbb-3(b)(1), unless the authorization is terminated or revoked.  Performed at [facility], [place]  [address], [place], [place] [address]    Radiology Studies: No results found.  Scheduled Meds:  acetaminophen  650 mg Oral Q6H   allopurinol  100 mg Oral Daily   amLODipine  10 mg Oral Daily   apixaban  5 mg Oral BID   atorvastatin  10 mg Oral Daily   carvedilol  25 mg Oral BID WC   colchicine  0.3 mg Oral Daily   diclofenac Sodium  2 g Topical QID   DULoxetine  60 mg Oral Daily   furosemide  40 mg Oral Daily   gabapentin  200 mg Oral QHS   insulin aspart  0-9 Units Subcutaneous TID WC   insulin glargine-yfgn  10 Units Subcutaneous Daily   latanoprost  1 drop Left Eye QHS   Continuous Infusions:   LOS: 19 days    Time spent: 25 mins    Shawna Clamp, MD [facility]   If 7PM-7AM, please contact night-coverage

## 2021-03-23 NOTE — Progress Notes (Signed)
Nutrition Brief Note  Patient identified on the Malnutrition Screening Tool (MST) Report.  Admitting Dx: Facial cellulitis [G95.621] PMH:  Past Medical History:  Diagnosis Date   Acute diastolic congestive heart failure (HCC)    Carotid stenosis    Carotid US 1/22: Bilateral ICA 1-39; follow-up as needed   Chronic kidney disease    stage 3   Diabetes mellitus without complication (HCC)    Gout    Hypercholesteremia    Hypertension    Neuromuscular disorder (HCC)    neuropathy   Osteopenia    Retinal detachment    TIA (transient ischemic attack)    Vitamin B12 deficiency     Medications:   acetaminophen  650 mg Oral Q6H   allopurinol  100 mg Oral Daily   amLODipine  10 mg Oral Daily   apixaban  5 mg Oral BID   atorvastatin  10 mg Oral Daily   carvedilol  25 mg Oral BID WC   colchicine  0.3 mg Oral Daily   diclofenac Sodium  2 g Topical QID   DULoxetine  60 mg Oral Daily   furosemide  40 mg Oral Daily   gabapentin  200 mg Oral QHS   insulin aspart  0-9 Units Subcutaneous TID WC   insulin glargine-yfgn  10 Units Subcutaneous Daily   latanoprost  1 drop Left Eye QHS   Labs: Recent Labs  Lab 03/20/21 0406 03/21/21 0356 03/22/21 0354  NA 137 139 139  K 4.1 4.0 4.5  CL 95* 96* 96*  CO2 35* 34* 35*  BUN 10 12 16   CREATININE 0.92 0.98 1.15*  CALCIUM 8.3* 8.3* 8.3*  MG 1.8 1.7 1.8  PHOS 3.3 4.0 5.6*  GLUCOSE 172* 167* 137*  CBGs 116-132-155  Wt Readings from Last 15 Encounters:  03/21/21 72.8 kg  02/15/21 72.6 kg  01/21/21 72.6 kg  12/28/20 76.7 kg  06/25/20 76.7 kg  12/01/19 74.4 kg  11/20/19 76.3 kg  06/30/19 78.8 kg  12/23/18 73.8 kg  10/11/18 67.4 kg  06/21/17 70.3 kg  01/24/16 67.1 kg  03/16/15 76.2 kg  06/17/14 70.3 kg  04/17/12 71 kg  Body mass index is 28.43 kg/m. Patient meets criteria for overweight based on current BMI.   Current diet order is heart healthy/carb modified, patient is consuming approximately 100% of meals at this time.  Discharge orders signed, pt to discharge to [facility] per TOC CM/SW. No nutrition interventions warranted at this time. If nutrition issues arise, please consult RD.   Larkin Ina, MS, RD, LDN (she/her/hers) RD pager number and weekend/on-call pager number located in [place].

## 2021-03-23 NOTE — Progress Notes (Signed)
DISCHARGE NOTE SNF [facility] to be discharged Skilled nursing facility per MD order. Patient verbalized understanding.  Skin clean, dry and intact without evidence of skin break down, no evidence of skin tears noted. IV catheter discontinued intact. Site without signs and symptoms of complications. Dressing and pressure applied. Pt denies pain at the site currently. No complaints noted.  Patient free of lines, drains, and wounds.   Discharge packet assembled. An After Visit Summary (AVS) was printed and given to the EMS personnel. Patient escorted via stretcher and discharged to [facility] via ambulance. Report called to accepting facility; all questions and concerns addressed.   Dolores Hoose, RN

## 2021-03-23 NOTE — TOC Transition Note (Signed)
Transition of Care [facility]) - CM/SW Discharge Note   Patient Details  Name: Sabrina Baker MRN: [id number] Date of Birth: 07/11/1940  [facility] CM/SW Contact:  Geralynn Ochs, LCSW Phone Number: 03/23/2021, 11:59 AM   Clinical Narrative:   Nurse to call report to [phone], Room 38.    Final next level of care: Skilled Nursing Facility Barriers to Discharge: Barriers Resolved   Patient Goals and CMS Choice Patient states their goals for this hospitalization and ongoing recovery are:: To go home CMS Medicare.gov Compare Post Acute Care list provided to:: Patient Choice offered to / list presented to : NA  Discharge Placement              Patient chooses bed at: [facility] Patient to be transferred to facility by: [facility] Name of family member notified: [place] Patient and family notified of of transfer: 03/23/21  Discharge Plan and Services   Discharge Planning Services: CM Consult, NA Post Acute Care Choice: NA          DME Arranged: N/A DME Agency: NA       HH Arranged: NA HH Agency: NA        Social Determinants of Health (SDOH) Interventions     Readmission Risk Interventions No flowsheet data found.

## 2021-03-24 ENCOUNTER — Inpatient Hospital Stay: Payer: Medicare Other | Admitting: Internal Medicine

## 2021-03-24 DIAGNOSIS — E1140 Type 2 diabetes mellitus with diabetic neuropathy, unspecified: Secondary | ICD-10-CM | POA: Diagnosis not present

## 2021-03-24 DIAGNOSIS — R5381 Other malaise: Secondary | ICD-10-CM | POA: Diagnosis not present

## 2021-03-24 DIAGNOSIS — E1122 Type 2 diabetes mellitus with diabetic chronic kidney disease: Secondary | ICD-10-CM | POA: Diagnosis not present

## 2021-03-24 DIAGNOSIS — I480 Paroxysmal atrial fibrillation: Secondary | ICD-10-CM | POA: Diagnosis not present

## 2021-03-24 DIAGNOSIS — N183 Chronic kidney disease, stage 3 unspecified: Secondary | ICD-10-CM | POA: Diagnosis not present

## 2021-03-24 DIAGNOSIS — L03211 Cellulitis of face: Secondary | ICD-10-CM | POA: Diagnosis not present

## 2021-03-24 DIAGNOSIS — M109 Gout, unspecified: Secondary | ICD-10-CM | POA: Diagnosis not present

## 2021-03-24 DIAGNOSIS — I10 Essential (primary) hypertension: Secondary | ICD-10-CM | POA: Diagnosis not present

## 2021-03-28 DIAGNOSIS — K219 Gastro-esophageal reflux disease without esophagitis: Secondary | ICD-10-CM | POA: Diagnosis not present

## 2021-03-28 DIAGNOSIS — L0293 Carbuncle, unspecified: Secondary | ICD-10-CM | POA: Diagnosis not present

## 2021-03-28 DIAGNOSIS — R5381 Other malaise: Secondary | ICD-10-CM | POA: Diagnosis not present

## 2021-03-28 DIAGNOSIS — L03211 Cellulitis of face: Secondary | ICD-10-CM | POA: Diagnosis not present

## 2021-03-28 DIAGNOSIS — E1140 Type 2 diabetes mellitus with diabetic neuropathy, unspecified: Secondary | ICD-10-CM | POA: Diagnosis not present

## 2021-03-28 DIAGNOSIS — E1122 Type 2 diabetes mellitus with diabetic chronic kidney disease: Secondary | ICD-10-CM | POA: Diagnosis not present

## 2021-03-28 DIAGNOSIS — E875 Hyperkalemia: Secondary | ICD-10-CM | POA: Diagnosis not present

## 2021-03-28 DIAGNOSIS — I10 Essential (primary) hypertension: Secondary | ICD-10-CM | POA: Diagnosis not present

## 2021-04-04 DIAGNOSIS — L03211 Cellulitis of face: Secondary | ICD-10-CM | POA: Diagnosis not present

## 2021-04-04 DIAGNOSIS — M109 Gout, unspecified: Secondary | ICD-10-CM | POA: Diagnosis not present

## 2021-04-04 DIAGNOSIS — E785 Hyperlipidemia, unspecified: Secondary | ICD-10-CM | POA: Diagnosis not present

## 2021-04-04 DIAGNOSIS — E1140 Type 2 diabetes mellitus with diabetic neuropathy, unspecified: Secondary | ICD-10-CM | POA: Diagnosis not present

## 2021-04-04 DIAGNOSIS — K219 Gastro-esophageal reflux disease without esophagitis: Secondary | ICD-10-CM | POA: Diagnosis not present

## 2021-04-04 DIAGNOSIS — D649 Anemia, unspecified: Secondary | ICD-10-CM | POA: Diagnosis not present

## 2021-04-04 DIAGNOSIS — R0981 Nasal congestion: Secondary | ICD-10-CM | POA: Diagnosis not present

## 2021-04-06 ENCOUNTER — Ambulatory Visit: Payer: Medicare Other | Admitting: Internal Medicine

## 2021-04-12 ENCOUNTER — Telehealth: Payer: Self-pay | Admitting: Internal Medicine

## 2021-04-12 NOTE — Telephone Encounter (Signed)
Patient calling to see if we received lab results from when patient was in the hospital at [facility], and then she had more labs while at [facility]. Lab report was to be sent here from [facility]. Patient states uric acid was high, and they increased medication. Patient would like to know if she needs to continue with current medication doses? Patient also needs to know when she will need to follow up here. Patient is hoping it will be after November due to so many other appointments she has to make. Please call patient to advise.

## 2021-04-12 NOTE — Telephone Encounter (Signed)
I do not see anything later than the 8/29 uric acid. Her hospitalization involved a lot of fluid medication this can commonly trigger or worsen gout. I agree with progressively increasing the allopurinol dose. If they increased from 100 mg to 200 mg or 300 mg daily then after another month should recheck the uric acid. We do not need a clinic visit right away if we can get the lab result checked a month after each dose increase.

## 2021-04-13 NOTE — Telephone Encounter (Addendum)
I called patient, patient has appt with PCP, Dr. Dema Severin 04/14/2021 and will have PCP recheck uric acid and forward labs to our office. Patient verbalized understanding.

## 2021-04-13 NOTE — Telephone Encounter (Signed)
LMOM for patient to call office to discuss results. 

## 2021-04-14 DIAGNOSIS — Z23 Encounter for immunization: Secondary | ICD-10-CM | POA: Diagnosis not present

## 2021-04-14 DIAGNOSIS — G894 Chronic pain syndrome: Secondary | ICD-10-CM | POA: Diagnosis not present

## 2021-04-14 DIAGNOSIS — M1A0690 Idiopathic chronic gout, unspecified knee, without tophus (tophi): Secondary | ICD-10-CM | POA: Diagnosis not present

## 2021-04-14 DIAGNOSIS — N179 Acute kidney failure, unspecified: Secondary | ICD-10-CM | POA: Diagnosis not present

## 2021-04-14 DIAGNOSIS — I5033 Acute on chronic diastolic (congestive) heart failure: Secondary | ICD-10-CM | POA: Diagnosis not present

## 2021-04-14 DIAGNOSIS — L03211 Cellulitis of face: Secondary | ICD-10-CM | POA: Diagnosis not present

## 2021-04-15 ENCOUNTER — Ambulatory Visit (INDEPENDENT_AMBULATORY_CARE_PROVIDER_SITE_OTHER): Payer: Medicare Other | Admitting: Podiatry

## 2021-04-15 ENCOUNTER — Other Ambulatory Visit: Payer: Self-pay | Admitting: Family Medicine

## 2021-04-15 ENCOUNTER — Ambulatory Visit
Admission: RE | Admit: 2021-04-15 | Discharge: 2021-04-15 | Disposition: A | Discharge status: Home / Self Care | Payer: Medicare Other | Source: Ambulatory Visit | Attending: Family Medicine | Admitting: Family Medicine

## 2021-04-15 ENCOUNTER — Telehealth: Payer: Self-pay

## 2021-04-15 ENCOUNTER — Encounter: Payer: Self-pay | Admitting: Podiatry

## 2021-04-15 ENCOUNTER — Other Ambulatory Visit: Payer: Self-pay

## 2021-04-15 DIAGNOSIS — I700 Atherosclerosis of aorta: Secondary | ICD-10-CM | POA: Diagnosis not present

## 2021-04-15 DIAGNOSIS — E1142 Type 2 diabetes mellitus with diabetic polyneuropathy: Secondary | ICD-10-CM | POA: Diagnosis not present

## 2021-04-15 DIAGNOSIS — M79674 Pain in right toe(s): Secondary | ICD-10-CM

## 2021-04-15 DIAGNOSIS — I5033 Acute on chronic diastolic (congestive) heart failure: Secondary | ICD-10-CM

## 2021-04-15 DIAGNOSIS — B351 Tinea unguium: Secondary | ICD-10-CM | POA: Diagnosis not present

## 2021-04-15 DIAGNOSIS — M79675 Pain in left toe(s): Secondary | ICD-10-CM | POA: Diagnosis not present

## 2021-04-15 DIAGNOSIS — M419 Scoliosis, unspecified: Secondary | ICD-10-CM | POA: Diagnosis not present

## 2021-04-15 DIAGNOSIS — I7789 Other specified disorders of arteries and arterioles: Secondary | ICD-10-CM | POA: Diagnosis not present

## 2021-04-15 NOTE — Telephone Encounter (Signed)
Patient called to confirm Dr. Benjamine Mola received her labwork results from Dr. Dema Severin and what she needs to do about her Allopurinol and Colchicine medications.

## 2021-04-15 NOTE — Telephone Encounter (Signed)
Lab results are in your folder for review. Please advise.

## 2021-04-15 NOTE — Progress Notes (Signed)
  Subjective:  Patient ID: Sabrina Baker, female    DOB: [birth year],  MRN: [id number]  Chief Complaint  Patient presents with   Nail Problem    Nail trim    80 y.o. female returns for the above complaint.  Patient presents with thickened elongated dystrophic toenails x10.  Pain on palpation.  Patient would like for me to debride down which is not able to do it herself.  She denies any other acute complaints.  She has an appointment scheduled for her to get diabetic shoes next week.  Objective:  There were no vitals filed for this visit. Podiatric Exam: Vascular: dorsalis pedis and posterior tibial pulses are palpable bilateral. Capillary return is immediate. Temperature gradient is WNL. Skin turgor WNL  Sensorium: Normal Semmes Weinstein monofilament test. Normal tactile sensation bilaterally. Nail Exam: Pt has thick disfigured discolored nails with subungual debris noted bilateral entire nail hallux through fifth toenails.  Pain on palpation to the nails. Ulcer Exam: There is no evidence of ulcer or pre-ulcerative changes or infection. Orthopedic Exam: Muscle tone and strength are WNL. No limitations in general ROM. No crepitus or effusions noted. HAV  B/L.  Hammer toes 2-5  B/L. Skin: No Porokeratosis. No infection or ulcers    Assessment & Plan:   1. Diabetic peripheral neuropathy associated with type 2 diabetes mellitus (HCC)   2. Pain due to onychomycosis of toenails of both feet      Patient was evaluated and treated and all questions answered.  Hammertoes 2 through 5 bilateral -I explained to the patient the etiology of hammertoe contractures and various treatment options were discussed.  Given the amount of pain she is having I believe patient will benefit from diabetic shoes to prevent ulceration given the contractures.  She will follow-up with Liliane Channel for diabetic shoes.   Onychomycosis with pain  -Nails palliatively debrided as below. -Educated on self-care  Procedure:  Nail Debridement Rationale: pain  Type of Debridement: manual, sharp debridement. Instrumentation: Nail nipper, rotary burr. Number of Nails: 10  Procedures and Treatment: Consent by patient was obtained for treatment procedures. The patient understood the discussion of treatment and procedures well. All questions were answered thoroughly reviewed. Debridement of mycotic and hypertrophic toenails, 1 through 5 bilateral and clearing of subungual debris. No ulceration, no infection noted.  Return Visit-Office Procedure: Patient instructed to return to the office for a follow up visit 3 months for continued evaluation and treatment.  Boneta Lucks, DPM    No follow-ups on file.

## 2021-04-17 NOTE — Progress Notes (Signed)
Cardiology Office Note:    Date:  04/18/2021   ID:  Sabrina Baker, DOB 06/07/1941, MRN [id number]  PCP:  [name], MD  Cardiologist:  Billal Rollo  Electrophysiologist:  None   Referring MD: [name], MD   Chief Complaint  Patient presents with   Atrial Fibrillation    Problem List 1. Paroxysmal atrial fib: CHADS2VASC = 7 ( female, age, TIA, HTN, DM)   2.  HTN 3, Covid 19 infection , April 2020  4.  DM  History of Present Illness:    Sabrina Baker is a 80 y.o. female with a  Recent hospitalization for COVID-19.  She had a prolonged hospitalization involving prolonged intubation.  She had paroxysmal atrial fibrillation.  She was placed on Eliquis at that time.  She was discharged in normal sinus rhythm.  She has been to [facility], [facility], [facility] She developed atrial fib while she was intubated and sick with COVID 19 . Has improved quite a bit since then.   Breathing has improved.  Has an occasional cough .   Her son died in 10/30/2022 of this year of CHF.   We discussed this   [name] 18, 2021 Sabrina Baker is seen today for follow up of her PAF, hyperlipidemia, and HTN. Her episode of PAF was related to a COVID infection Exercises some,  Has been doing some walking . Still fatigued from her covid .   Has occasional twinges in her left chest  Reports some swelling of her right leg, goes down at night  Is on eliquis so unlikely to be a DVT . No further arrhythmias to suggest afib .  Is a vegetarian - eats no meat   Nov. 7, 2022 Sabrina Baker is seen today for follow up of her PAF, HLD, HTN Was in the hospital for facial cellulitis, acute diastolic CHF  Required oxygen for a while Now is saturating well without supplemental O2 now CXR last Friday looked ok    Past Medical History:  Diagnosis Date   Acute diastolic congestive heart failure ([place])    Carotid stenosis    Carotid US 1/22: Bilateral ICA 1-39; follow-up as needed   Chronic kidney disease    stage  3   Diabetes mellitus without complication ([place])    Gout    Hypercholesteremia    Hypertension    Neuromuscular disorder ([place])    neuropathy   Osteopenia    Retinal detachment    TIA (transient ischemic attack)    Vitamin B12 deficiency     Past Surgical History:  Procedure Laterality Date   25 GAUGE PARS PLANA VITRECTOMY WITH 20 GAUGE MVR PORT Right 03/16/2015   Procedure: 25 GAUGE PARS PLANA VITRECTOMY WITH 23 GAUGE MVR PORT;  Surgeon: Hayden Pedro, MD;  Location: [place];  Service: Ophthalmology;  Laterality: Right;   ANKLE SURGERY     BREAST EXCISIONAL BIOPSY     BREAST LUMPECTOMY WITH RADIOACTIVE SEED LOCALIZATION Right 01/24/2016   Procedure: RIGHT BREAST LUMPECTOMY WITH RADIOACTIVE SEED LOCALIZATION;  Surgeon: Coralie Keens, MD;  Location: [place];  Service: General;  Laterality: Right;   EYE SURGERY     detached retna X2   HEEL SPUR SURGERY  2012   INCISION AND DRAINAGE ABSCESS Right 11/21/2019   Procedure: INCISION AND DRAINAGE FOREHEAD & RIGHT EAR ABSCESS;  Surgeon: Jerrell Belfast, MD;  Location: [place];  Service: ENT;  Laterality: Right;   INJECTION OF SILICONE OIL Right 66/0/6301   Procedure:  Extraction OF SILICONE OIL;  Surgeon: Hayden Pedro, MD;  Location: [place];  Service: Ophthalmology;  Laterality: Right;   ORIF ANKLE FRACTURE Left 06/17/2014   Procedure: OPEN REDUCTION INTERNAL FIXATION (ORIF) LEFT BIMALLEOLAR ANKLE FRACTURE;  Surgeon: Marianna Payment, MD;  Location: [place];  Service: Orthopedics;  Laterality: Left;   PHOTOCOAGULATION WITH LASER Right 03/16/2015   Procedure: PHOTOCOAGULATION WITH LASER;  Surgeon: Hayden Pedro, MD;  Location: [place];  Service: Ophthalmology;  Laterality: Right;   SHOULDER ARTHROSCOPY Right    plates and screws   VITRECTOMY Right 03/16/2015   WRIST ARTHROPLASTY Left    plates and screws and bone graft    Current Medications: Current Meds  Medication Sig   acetaminophen (TYLENOL) 650 MG CR tablet Take  1,300 mg by mouth every 8 (eight) hours as needed for pain.   allopurinol (ZYLOPRIM) 100 MG tablet Take 100 mg by mouth daily.   amLODipine (NORVASC) 10 MG tablet Take 10 mg by mouth daily.   apixaban (ELIQUIS) 5 MG TABS tablet Take 1 tablet (5 mg total) by mouth 2 (two) times daily.   atorvastatin (LIPITOR) 10 MG tablet Take 10 mg by mouth daily.   Blood Glucose Monitoring Suppl (GLUCOCOM BLOOD GLUCOSE MONITOR) DEVI Use to test blood sugar 3 times a day (E11.65)   calcitRIOL (ROCALTROL) 0.25 MCG capsule Take 0.25 mcg by mouth 3 (three) times a week. Mon, Wed, Friday   calcium-vitamin D (OSCAL WITH D) 500-200 MG-UNIT TABS tablet Take 1 tablet by mouth daily.    carvedilol (COREG) 25 MG tablet Take 25 mg by mouth 2 (two) times daily with a meal.   colchicine 0.6 MG tablet Take 0.5 tablets (0.3 mg total) by mouth daily. Started 04/07/13   [employer] B-12 500 MCG tablet Take 1,000 mcg by mouth daily.   DULoxetine (CYMBALTA) 60 MG capsule Take 60 mg by mouth daily.   Ferrous Sulfate (IRON) 325 (65 FE) MG TABS Take 1 tablet by mouth 2 (two) times daily.   furosemide (LASIX) 40 MG tablet Take 1 tablet (40 mg total) by mouth daily.   gabapentin (NEURONTIN) 100 MG capsule Take 200 mg by mouth at bedtime.   HYDROcodone-acetaminophen (NORCO/VICODIN) 5-325 MG tablet Take 1 tablet by mouth every 6 (six) hours as needed for moderate pain.   insulin glargine, 2 Unit Dial, (TOUJEO MAX SOLOSTAR) 300 UNIT/ML Solostar Pen Inject 10 Units into the skin daily.   Insulin Pen Needle 32G X 4 MM MISC USE TO INJECT INSULIN ONCE A DAY   ketoconazole (NIZORAL) 2 % shampoo Apply 1 application topically 2 (two) times a week.    Lancets 30G MISC Use to test blood sugars 3 times a day (Dx: E11.65)   latanoprost (XALATAN) 0.005 % ophthalmic solution Place 1 drop into the left eye at bedtime.    mupirocin cream (BACTROBAN) 2 % Apply topically 2 (two) times daily. Apply to forehead wound until healed   nateglinide (STARLIX) 120 MG  tablet Take 120 mg by mouth 3 (three) times daily as needed. If CBG is above 130   ONETOUCH VERIO test strip USE AS DIRECTED. TESTING FREQUENCY  3 X/DAILY (DX E11.65)   triamcinolone cream (KENALOG) 0.1 % Apply 1 application topically 2 (two) times daily as needed (For rash).    trimethoprim-polymyxin b (POLYTRIM) ophthalmic solution Use 4 times in left eye the day of injection, 4 drop the following day.   TRULICITY 1.5 NI/7.7OE SOPN Inject 3 mg into the skin every 7 (  seven) days. Friday     Allergies:   Ramipril, Other, Cortisone, Niacin, Niacin and related, and Valsartan   Social History   Socioeconomic History   Marital status: Divorced    Spouse name: Not on file   Number of children: Not on file   Years of education: Not on file   Highest education level: Not on file  Occupational History   Not on file  Tobacco Use   Smoking status: Never   Smokeless tobacco: Never  Vaping Use   Vaping Use: Never used  Substance and Sexual Activity   Alcohol use: No   Drug use: No   Sexual activity: Never    Birth control/protection: Post-menopausal  Other Topics Concern   Not on file  Social History Narrative   Not on file   Social Determinants of Health   Financial Resource Strain: Not on file  Food Insecurity: Not on file  Transportation Needs: Not on file  Physical Activity: Not on file  Stress: Not on file  Social Connections: Not on file     Family History: The patient's family history is not on file.  ROS:   Please see the history of present illness.     All other systems reviewed and are negative.  EKGs/Labs/Other Studies Reviewed:    The following studies were reviewed today:     Recent Labs: 03/22/2021: ALT 17; B Natriuretic Peptide 100.3; BUN 16; Creatinine, Ser 1.15; Hemoglobin 11.1; Magnesium 1.8; Platelets 393; Potassium 4.5; Sodium 139  Recent Lipid Panel    Component Value Date/Time   CHOL 134 04/18/2012 0540   TRIG 76 09/22/2018 0254   HDL 58  04/18/2012 0540   CHOLHDL 2.3 04/18/2012 0540   VLDL 14 04/18/2012 0540   LDLCALC 62 04/18/2012 0540    Physical Exam:     Physical Exam: Blood pressure 122/72, pulse 61, height 5\' 3"  (1.6 m), weight 150 lb 9.6 oz (68.3 kg), SpO2 97 %.  GEN:  Well nourished, well developed in no acute distress HEENT: Normal,  small area of healing cellulitis beside Left ear  NECK: No JVD; No carotid bruits LYMPHATICS: No lymphadenopathy CARDIAC: RRR ,  2/6 systolic murmur at LSB , radiating to L ax line.  RESPIRATORY:  Clear to auscultation without rales, wheezing or rhonchi  ABDOMEN: Soft, non-tender, non-distended MUSCULOSKELETAL:  No edema; No deformity  SKIN: Warm and dry NEUROLOGIC:  Alert and oriented x 3   ECG:  ASSESSMENT:    1. Acute diastolic congestive heart failure ([place])   2. Nonrheumatic aortic valve stenosis   3. Paroxysmal atrial fibrillation (HCC)     PLAN:      PAF:   CHADS2VASC is 3 ( female, age, TIA, HTN, DM ).      No recurrent Afib.  Is on eliquis  Cont current meds.   2. HTN:   BP is well controlled.   3.  Hyperlipidema:   LDL is 66.  Cont current meds.   4.  Aortic stenosis: She has mild to moderate aortic stenosis.  Is basically asymptomatic.  We will continue to follow.  Hopefully she will not have to have anything further done with her aortic valve.  We can certainly consider TAVR if and when the valve becomes severely stenotic. Will see her in 1 year with me or APP      Medication Adjustments/Labs and Tests Ordered: Current medicines are reviewed at length with the patient today.  Concerns regarding medicines are outlined above.  No  orders of the defined types were placed in this encounter.  No orders of the defined types were placed in this encounter.    Patient Instructions  Medication Instructions:  Your physician recommends that you continue on your current medications as directed. Please refer to the Current Medication list given to you  today.  *If you need a refill on your cardiac medications before your next appointment, please call your pharmacy*   Lab Work: NONE If you have labs (blood work) drawn today and your tests are completely normal, you will receive your results only by: [place] (if you have MyChart) OR A paper copy in the mail If you have any lab test that is abnormal or we need to change your treatment, we will call you to review the results.   Testing/Procedures: NONE   Follow-Up: At [facility], you and your health needs are our priority.  As part of our continuing mission to provide you with exceptional heart care, we have created designated Provider Care Teams.  These Care Teams include your primary Cardiologist (physician) and Advanced Practice Providers (APPs -  Physician Assistants and Nurse Practitioners) who all work together to provide you with the care you need, when you need it.  We recommend signing up for the patient portal called "MyChart".  Sign up information is provided on this After Visit Summary.  MyChart is used to connect with patients for Virtual Visits (Telemedicine).  Patients are able to view lab/test results, encounter notes, upcoming appointments, etc.  Non-urgent messages can be sent to your provider as well.   To learn more about what you can do with MyChart, go to [email or web address].    Your next appointment:   1 year(s)  The format for your next appointment:   In Person  Provider:   Mertie Moores, MD  or APP       Signed, Mertie Moores, MD  04/18/2021 9:20 AM    [place]

## 2021-04-18 ENCOUNTER — Telehealth: Payer: Self-pay

## 2021-04-18 ENCOUNTER — Other Ambulatory Visit: Payer: Self-pay

## 2021-04-18 ENCOUNTER — Encounter (INDEPENDENT_AMBULATORY_CARE_PROVIDER_SITE_OTHER): Payer: Medicare Other | Admitting: Ophthalmology

## 2021-04-18 ENCOUNTER — Ambulatory Visit (INDEPENDENT_AMBULATORY_CARE_PROVIDER_SITE_OTHER): Payer: Medicare Other | Admitting: Cardiovascular Disease

## 2021-04-18 ENCOUNTER — Encounter: Payer: Self-pay | Admitting: Cardiovascular Disease

## 2021-04-18 VITALS — BP 122/72 | HR 61 | Ht 63.0 in | Wt 150.6 lb

## 2021-04-18 DIAGNOSIS — H33302 Unspecified retinal break, left eye: Secondary | ICD-10-CM

## 2021-04-18 DIAGNOSIS — I5031 Acute diastolic (congestive) heart failure: Secondary | ICD-10-CM | POA: Diagnosis not present

## 2021-04-18 DIAGNOSIS — H338 Other retinal detachments: Secondary | ICD-10-CM | POA: Diagnosis not present

## 2021-04-18 DIAGNOSIS — I350 Nonrheumatic aortic (valve) stenosis: Secondary | ICD-10-CM | POA: Diagnosis not present

## 2021-04-18 DIAGNOSIS — H353122 Nonexudative age-related macular degeneration, left eye, intermediate dry stage: Secondary | ICD-10-CM | POA: Diagnosis not present

## 2021-04-18 DIAGNOSIS — I480 Paroxysmal atrial fibrillation: Secondary | ICD-10-CM

## 2021-04-18 DIAGNOSIS — E113511 Type 2 diabetes mellitus with proliferative diabetic retinopathy with macular edema, right eye: Secondary | ICD-10-CM

## 2021-04-18 DIAGNOSIS — H43813 Vitreous degeneration, bilateral: Secondary | ICD-10-CM | POA: Diagnosis not present

## 2021-04-18 DIAGNOSIS — I10 Essential (primary) hypertension: Secondary | ICD-10-CM

## 2021-04-18 DIAGNOSIS — H35033 Hypertensive retinopathy, bilateral: Secondary | ICD-10-CM

## 2021-04-18 DIAGNOSIS — M1A9XX1 Chronic gout, unspecified, with tophus (tophi): Secondary | ICD-10-CM

## 2021-04-18 DIAGNOSIS — E113312 Type 2 diabetes mellitus with moderate nonproliferative diabetic retinopathy with macular edema, left eye: Secondary | ICD-10-CM | POA: Diagnosis not present

## 2021-04-18 MED ORDER — COLCHICINE 0.6 MG PO TABS: 0.3000 mg | ORAL_TABLET | Freq: Every day | ORAL | 0 refills | Status: DC

## 2021-04-18 MED ORDER — ALLOPURINOL 100 MG PO TABS: ORAL_TABLET | ORAL | 0 refills | Status: DC

## 2021-04-18 NOTE — Telephone Encounter (Signed)
Patient is scheduled for 06/20/2021 with Dr. Benjamine Mola.

## 2021-04-18 NOTE — Patient Instructions (Signed)
Medication Instructions:  Your physician recommends that you continue on your current medications as directed. Please refer to the Current Medication list given to you today.  *If you need a refill on your cardiac medications before your next appointment, please call your pharmacy*   Lab Work: NONE If you have labs (blood work) drawn today and your tests are completely normal, you will receive your results only by: [place] (if you have MyChart) OR A paper copy in the mail If you have any lab test that is abnormal or we need to change your treatment, we will call you to review the results.   Testing/Procedures: NONE   Follow-Up: At [facility], you and your health needs are our priority.  As part of our continuing mission to provide you with exceptional heart care, we have created designated Provider Care Teams.  These Care Teams include your primary Cardiologist (physician) and Advanced Practice Providers (APPs -  Physician Assistants and Nurse Practitioners) who all work together to provide you with the care you need, when you need it.  We recommend signing up for the patient portal called "MyChart".  Sign up information is provided on this After Visit Summary.  MyChart is used to connect with patients for Virtual Visits (Telemedicine).  Patients are able to view lab/test results, encounter notes, upcoming appointments, etc.  Non-urgent messages can be sent to your provider as well.   To learn more about what you can do with MyChart, go to [email or web address].    Your next appointment:   1 year(s)  The format for your next appointment:   In Person  Provider:   Mertie Moores, MD  or APP

## 2021-04-18 NOTE — Telephone Encounter (Signed)
Please schedule Sabrina Baker for a follow up in approximately 2 months. I spoke with Sabrina Baker about her medication she was decreased back down to 100 mg allopurinol and colchicine 0.3 mg daily due to renal function. I instructed her to increased medicine back up to 200 mg daily for 1 month then to 300 mg daily and we should follow up after that. She can continue the colchicine 0.3 mg daily.

## 2021-04-18 NOTE — Telephone Encounter (Signed)
Patient stopped by the office on her way to her appointment with Dr. Zigmund Daniel to see if Dr. Benjamine Mola had a chance to review her labwork from Dr. Dema Severin.  Patient states she needs to know what to do about her medications.  Patient states she will stop by our office when her appointment with Dr. Rodena Piety is over.

## 2021-04-19 ENCOUNTER — Ambulatory Visit: Payer: Medicare Other | Admitting: Orthopedic Surgery

## 2021-04-20 DIAGNOSIS — L03211 Cellulitis of face: Secondary | ICD-10-CM | POA: Diagnosis not present

## 2021-04-22 DIAGNOSIS — D72829 Elevated white blood cell count, unspecified: Secondary | ICD-10-CM | POA: Diagnosis not present

## 2021-04-25 DIAGNOSIS — Z794 Long term (current) use of insulin: Secondary | ICD-10-CM | POA: Diagnosis not present

## 2021-04-25 DIAGNOSIS — E1165 Type 2 diabetes mellitus with hyperglycemia: Secondary | ICD-10-CM | POA: Diagnosis not present

## 2021-04-26 ENCOUNTER — Ambulatory Visit (INDEPENDENT_AMBULATORY_CARE_PROVIDER_SITE_OTHER): Payer: Medicare Other | Admitting: Podiatrist

## 2021-04-26 ENCOUNTER — Other Ambulatory Visit: Payer: Self-pay

## 2021-04-26 DIAGNOSIS — E1142 Type 2 diabetes mellitus with diabetic polyneuropathy: Secondary | ICD-10-CM

## 2021-04-26 DIAGNOSIS — E11610 Type 2 diabetes mellitus with diabetic neuropathic arthropathy: Secondary | ICD-10-CM

## 2021-04-26 NOTE — Progress Notes (Signed)
DIABETIC SHOES CASTING:  Patient presented for foam casting for 3 pr custom diabetic shoe inserts-  Patient has charcot changes bilateral and these areas are marked for offloading in the foam impressions taken today. A tri-layer diabetic insert will be ordered as well.   Patient is measured with a [employer] device to be a size 6 Left and 7 Right Wide.  A size 7 Wide will be ordered.     Diabetic shoes are chosen from the [employer].  The shoes chosen are [id number].  Second choice is the [id number].    Patients primary care phsician is Dr. [name], MD   The patient will be contacted when the shoes and insert are ready to be picked up.

## 2021-04-29 DIAGNOSIS — N189 Chronic kidney disease, unspecified: Secondary | ICD-10-CM | POA: Diagnosis not present

## 2021-04-29 DIAGNOSIS — Z7984 Long term (current) use of oral hypoglycemic drugs: Secondary | ICD-10-CM | POA: Diagnosis not present

## 2021-04-29 DIAGNOSIS — E1122 Type 2 diabetes mellitus with diabetic chronic kidney disease: Secondary | ICD-10-CM | POA: Diagnosis not present

## 2021-04-29 DIAGNOSIS — Z794 Long term (current) use of insulin: Secondary | ICD-10-CM | POA: Diagnosis not present

## 2021-04-29 DIAGNOSIS — E1140 Type 2 diabetes mellitus with diabetic neuropathy, unspecified: Secondary | ICD-10-CM | POA: Diagnosis not present

## 2021-05-04 ENCOUNTER — Telehealth: Payer: Self-pay

## 2021-05-04 DIAGNOSIS — M1A9XX1 Chronic gout, unspecified, with tophus (tophi): Secondary | ICD-10-CM

## 2021-05-04 NOTE — Telephone Encounter (Signed)
Patient advised Uric acid was not checked but that was also too soon after increasing back to 200 mg daily allopurinol. Patient advised Dr. Benjamine Mola will place future lab orders for 12/5 we can see how she is doing. If still high we would call with plan for increasing dose.

## 2021-05-04 NOTE — Telephone Encounter (Signed)
Patient called requesting a return call to let her know if she is due for labwork.  Patient states she had labwork with Dr. Dema Severin on 04/25/21, but is not sure if they checked her uric acid.  Patient states she is still taking Allopurinol 200 mg daily and Colchicine 1/2 tablet/daily.  Patient states she has an appointment with Dr. Zigmund Daniel on 05/16/21 which is in the same building and will stop by the office if Dr. Benjamine Mola needs additional labwork.

## 2021-05-04 NOTE — Telephone Encounter (Signed)
Uric acid was not checked but that was also too soon after increasing back to 200 mg daily allopurinol. I will place future lab orders for 12/5 we can see how she is doing. If still high we would call with plan for increasing dose.

## 2021-05-10 ENCOUNTER — Ambulatory Visit (INDEPENDENT_AMBULATORY_CARE_PROVIDER_SITE_OTHER): Payer: Medicare Other | Admitting: Orthopedic Surgery

## 2021-05-10 ENCOUNTER — Other Ambulatory Visit: Payer: Self-pay

## 2021-05-10 ENCOUNTER — Encounter: Payer: Self-pay | Admitting: Orthopedic Surgery

## 2021-05-10 DIAGNOSIS — M1A9XX1 Chronic gout, unspecified, with tophus (tophi): Secondary | ICD-10-CM | POA: Diagnosis not present

## 2021-05-10 NOTE — Progress Notes (Signed)
Office Visit Note   Patient: Sabrina Baker           Date of Birth: Oct 28, 1940           MRN: [id number] Visit Date: 05/10/2021              Requested by: [name], MD [address],  [place] [id number] PCP: [name], MD   Assessment & Plan: Visit Diagnoses:  1. Tophaceous gout of joint     Plan: Discussed with patient that the only surgical treatment that I could suggest would be arthrodesis of her gouty thumb IP joint.  Her thumb IP joint actually seems much less painful than when I saw her several months ago.  She is currently working with her rheumatologist to optimize her gout medication regimen.  Her thumb IP joint does not bother her enough to consider surgery currently.  I can see her back if her symptoms worsen.   Follow-Up Instructions: No follow-ups on file.   Orders:  No orders of the defined types were placed in this encounter.  No orders of the defined types were placed in this encounter.     Procedures: No procedures performed   Clinical Data: No additional findings.   Subjective: Chief Complaint  Patient presents with   Right Thumb - Pain    This is a 80 yo RHD F who presents for follow up of tophaceous gout involving her right thumb IP joint.  This has been going on since early September.  Her symptoms worsened after a hospitalization shortly after for facial cellulitis.  She has been working with her rheumatologist to optimize her gout medication regimen.  Her thumb pain, swelling, and erythema are much improved today from previous.     Review of Systems   Objective: Vital Signs: There were no vitals taken for this visit.  Physical Exam  Right Hand Exam   Tenderness  Right hand tenderness location: Mildly TTP at thumb IP joint.  Other  Erythema: absent Sensation: normal Pulse: present  Comments:  Previous thumb IP tophaceous swelling has improved as has the erythema.  She has limited ROM at the IP joint  but it is painless.      Specialty Comments:  No specialty comments available.  Imaging: No results found.   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/19/2015   Gastroenteritis 04/17/2012   Dehydration 04/17/2012   Diabetes mellitus ([place]) 04/17/2012   HTN (hypertension) 04/17/2012   TIA (transient ischemic attack) 04/17/2012   Past Medical History:  Diagnosis Date   Acute diastolic congestive heart failure ([place])    Carotid stenosis    Carotid US 1/22: Bilateral ICA 1-39; follow-up as needed   Chronic kidney disease    stage 3   Diabetes mellitus without complication ([place])    Gout    Hypercholesteremia    Hypertension    Neuromuscular disorder ([place])  neuropathy   Osteopenia    Retinal detachment    TIA (transient ischemic attack)    Vitamin B12 deficiency     History reviewed. No pertinent family history.  Past Surgical History:  Procedure Laterality Date   25 GAUGE PARS PLANA VITRECTOMY WITH 20 GAUGE MVR PORT Right 03/16/2015   Procedure: 25 GAUGE PARS PLANA VITRECTOMY WITH 23 GAUGE MVR PORT;  Surgeon: Hayden Pedro, MD;  Location: [facility];  Service: Ophthalmology;  Laterality: Right;   ANKLE SURGERY     BREAST EXCISIONAL BIOPSY     BREAST LUMPECTOMY WITH RADIOACTIVE SEED LOCALIZATION Right 01/24/2016   Procedure: RIGHT BREAST LUMPECTOMY WITH RADIOACTIVE SEED LOCALIZATION;  Surgeon: Coralie Keens, MD;  Location: [facility];  Service: General;  Laterality: Right;   EYE SURGERY     detached retna X2   HEEL SPUR SURGERY  2012   INCISION AND DRAINAGE ABSCESS Right 11/21/2019   Procedure: INCISION AND DRAINAGE FOREHEAD & RIGHT EAR ABSCESS;  Surgeon: Jerrell Belfast, MD;  Location: [facility];  Service: ENT;  Laterality: Right;   INJECTION OF SILICONE OIL Right 74/0/8144   Procedure: Extraction OF SILICONE OIL;  Surgeon: Hayden Pedro, MD;  Location: [facility];  Service: Ophthalmology;  Laterality: Right;   ORIF ANKLE FRACTURE Left 06/17/2014   Procedure: OPEN REDUCTION INTERNAL FIXATION (ORIF) LEFT BIMALLEOLAR ANKLE FRACTURE;  Surgeon: Marianna Payment, MD;  Location: [facility];  Service: Orthopedics;  Laterality: Left;   PHOTOCOAGULATION WITH LASER Right 03/16/2015   Procedure: PHOTOCOAGULATION WITH LASER;  Surgeon: Hayden Pedro, MD;  Location: [facility];  Service: Ophthalmology;  Laterality: Right;   SHOULDER ARTHROSCOPY Right    plates and screws   VITRECTOMY Right 03/16/2015   WRIST ARTHROPLASTY Left    plates and screws and bone graft   Social History   Occupational History   Not on file  Tobacco Use   Smoking status: Never   Smokeless tobacco: Never  Vaping Use   Vaping Use: Never used  Substance and Sexual Activity   Alcohol use: No   Drug use: No   Sexual activity: Never    Birth control/protection: Post-menopausal

## 2021-05-16 ENCOUNTER — Other Ambulatory Visit: Payer: Self-pay

## 2021-05-16 ENCOUNTER — Encounter (INDEPENDENT_AMBULATORY_CARE_PROVIDER_SITE_OTHER): Payer: Medicare Other | Admitting: Ophthalmology

## 2021-05-16 ENCOUNTER — Other Ambulatory Visit: Payer: Self-pay | Admitting: *Deleted

## 2021-05-16 DIAGNOSIS — I10 Essential (primary) hypertension: Secondary | ICD-10-CM

## 2021-05-16 DIAGNOSIS — H338 Other retinal detachments: Secondary | ICD-10-CM

## 2021-05-16 DIAGNOSIS — E113591 Type 2 diabetes mellitus with proliferative diabetic retinopathy without macular edema, right eye: Secondary | ICD-10-CM | POA: Diagnosis not present

## 2021-05-16 DIAGNOSIS — E113312 Type 2 diabetes mellitus with moderate nonproliferative diabetic retinopathy with macular edema, left eye: Secondary | ICD-10-CM | POA: Diagnosis not present

## 2021-05-16 DIAGNOSIS — H35033 Hypertensive retinopathy, bilateral: Secondary | ICD-10-CM

## 2021-05-16 DIAGNOSIS — H43813 Vitreous degeneration, bilateral: Secondary | ICD-10-CM

## 2021-05-16 DIAGNOSIS — M1A9XX1 Chronic gout, unspecified, with tophus (tophi): Secondary | ICD-10-CM

## 2021-05-16 LAB — URIC ACID: Uric Acid, Serum: 7.3 mg/dL — ABNORMAL HIGH (ref 2.5–7.0)

## 2021-05-17 ENCOUNTER — Other Ambulatory Visit: Payer: Self-pay | Admitting: Internal Medicine

## 2021-05-17 ENCOUNTER — Telehealth: Payer: Self-pay | Admitting: Podiatry

## 2021-05-17 DIAGNOSIS — M1A9XX1 Chronic gout, unspecified, with tophus (tophi): Secondary | ICD-10-CM

## 2021-05-17 NOTE — Telephone Encounter (Signed)
Pt called and left message checking on status of diabetic shoes.  Upon checking we have not gotten the paperwork signed from pcp. Called pt and she said Dr Dema Severin is her pcp and Jacolyn Reedy fnp treats her diabetes. Pt to call the endocrinologist in the morning to find out the overseeing provider as her previous md/do retired. She is to call me back and let me know and I will fax over documents to them.

## 2021-05-18 ENCOUNTER — Telehealth: Payer: Self-pay | Admitting: Podiatry

## 2021-05-18 NOTE — Telephone Encounter (Signed)
Pt left message stating she called the endo office and told them to have the doctor sign off on her diabetic shoes.  I returned call and explained that the documents that are needed have to be in the doctor's name and I called the endo office and Dr Manus Gunning is the overseeing md/do for the np so I have created a new order with his name and faxed it to them. She said thank you

## 2021-05-19 ENCOUNTER — Telehealth: Payer: Self-pay

## 2021-05-19 NOTE — Telephone Encounter (Signed)
Uric acid is 7.3 an improvement from 7.9 but still significantly above goal of 6. She should increase the allopurinol dose to 300 mg once daily. She can take 3 of the current tablets for this but in future refills we can send 300 mg strength tablet to reduce pill count. I do recommend continuing the low dose colchicine for now.

## 2021-05-19 NOTE — Telephone Encounter (Signed)
Patient called stating she had labwork on 05/16/21 to check her uric acid to determine if she needs to continue taking 200 mg Allopurinol and 1/2 Colchicine.  Patient requested a return call AFTER 2:00 pm.

## 2021-05-19 NOTE — Telephone Encounter (Signed)
Attempted to contact the patient, no answer and unable to leave a message.

## 2021-05-20 NOTE — Telephone Encounter (Signed)
Patient advised Uric acid is 7.3 an improvement from 7.9 but still significantly above goal of 6. She should increase the allopurinol dose to 300 mg once daily. She can take 3 of the current tablets for this but in future refills we can send 300 mg strength tablet to reduce pill count. Patient advised Dr. Benjamine Mola does recommend continuing the low dose colchicine for now.

## 2021-05-27 ENCOUNTER — Ambulatory Visit
Admission: RE | Admit: 2021-05-27 | Discharge: 2021-05-27 | Disposition: A | Discharge status: Home / Self Care | Payer: Medicare Other | Source: Ambulatory Visit | Attending: Family Medicine | Admitting: Family Medicine

## 2021-05-27 DIAGNOSIS — Z1231 Encounter for screening mammogram for malignant neoplasm of breast: Secondary | ICD-10-CM

## 2021-05-31 DIAGNOSIS — H1711 Central corneal opacity, right eye: Secondary | ICD-10-CM | POA: Diagnosis not present

## 2021-05-31 DIAGNOSIS — Z961 Presence of intraocular lens: Secondary | ICD-10-CM | POA: Diagnosis not present

## 2021-05-31 DIAGNOSIS — H401113 Primary open-angle glaucoma, right eye, severe stage: Secondary | ICD-10-CM | POA: Diagnosis not present

## 2021-05-31 DIAGNOSIS — H16231 Neurotrophic keratoconjunctivitis, right eye: Secondary | ICD-10-CM | POA: Diagnosis not present

## 2021-05-31 DIAGNOSIS — H40022 Open angle with borderline findings, high risk, left eye: Secondary | ICD-10-CM | POA: Diagnosis not present

## 2021-06-09 ENCOUNTER — Other Ambulatory Visit: Payer: Self-pay

## 2021-06-09 ENCOUNTER — Ambulatory Visit: Payer: Medicare Other

## 2021-06-09 DIAGNOSIS — M2042 Other hammer toe(s) (acquired), left foot: Secondary | ICD-10-CM

## 2021-06-09 DIAGNOSIS — E11610 Type 2 diabetes mellitus with diabetic neuropathic arthropathy: Secondary | ICD-10-CM

## 2021-06-09 DIAGNOSIS — E1142 Type 2 diabetes mellitus with diabetic polyneuropathy: Secondary | ICD-10-CM

## 2021-06-09 NOTE — Progress Notes (Signed)
SITUATION Reason for Visit: Fitting of Diabetic Shoes & Insoles Patient / Caregiver Report:  Patient is frustrated but understands why insoles need to be remade  OBJECTIVE DATA: Patient History / Diagnosis:     ICD-10-CM   1. Diabetic peripheral neuropathy associated with type 2 diabetes mellitus (HCC)  E11.42     2. 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 Hammer toes of both feet  M20.41    M20.42       Change in Status:   None  ACTIONS PERFORMED: Attempted to fit patient with insoles and shoes. Insoles were not fabricated to match patient's charcot deformities. New molds were taken. Shoes did not fit patient's foot. Opening is not wide enough. B3000W leather shoes with velcro strap to be ordered, size 7XW  PLAN Patient to return in four weeks. Plan of care was discussed with and agreed upon by patient and/or caregiver. All questions were answered and concerns addressed.

## 2021-06-13 ENCOUNTER — Encounter (INDEPENDENT_AMBULATORY_CARE_PROVIDER_SITE_OTHER): Payer: Commercial Managed Care - HMO | Admitting: Ophthalmology

## 2021-06-13 ENCOUNTER — Other Ambulatory Visit: Payer: Self-pay

## 2021-06-13 DIAGNOSIS — H33302 Unspecified retinal break, left eye: Secondary | ICD-10-CM | POA: Diagnosis not present

## 2021-06-13 DIAGNOSIS — E113511 Type 2 diabetes mellitus with proliferative diabetic retinopathy with macular edema, right eye: Secondary | ICD-10-CM | POA: Diagnosis not present

## 2021-06-13 DIAGNOSIS — H35033 Hypertensive retinopathy, bilateral: Secondary | ICD-10-CM | POA: Diagnosis not present

## 2021-06-13 DIAGNOSIS — H43813 Vitreous degeneration, bilateral: Secondary | ICD-10-CM | POA: Diagnosis not present

## 2021-06-13 DIAGNOSIS — E113312 Type 2 diabetes mellitus with moderate nonproliferative diabetic retinopathy with macular edema, left eye: Secondary | ICD-10-CM

## 2021-06-13 DIAGNOSIS — H338 Other retinal detachments: Secondary | ICD-10-CM | POA: Diagnosis not present

## 2021-06-13 DIAGNOSIS — I10 Essential (primary) hypertension: Secondary | ICD-10-CM

## 2021-06-13 DIAGNOSIS — R69 Illness, unspecified: Secondary | ICD-10-CM | POA: Diagnosis not present

## 2021-06-13 DIAGNOSIS — H35373 Puckering of macula, bilateral: Secondary | ICD-10-CM | POA: Diagnosis not present

## 2021-06-15 ENCOUNTER — Ambulatory Visit: Payer: Medicare Other | Admitting: Cardiovascular Disease

## 2021-06-15 DIAGNOSIS — R69 Illness, unspecified: Secondary | ICD-10-CM | POA: Diagnosis not present

## 2021-06-17 ENCOUNTER — Other Ambulatory Visit: Payer: Self-pay

## 2021-06-17 ENCOUNTER — Encounter (INDEPENDENT_AMBULATORY_CARE_PROVIDER_SITE_OTHER): Payer: Commercial Managed Care - HMO | Admitting: Ophthalmology

## 2021-06-17 DIAGNOSIS — E113312 Type 2 diabetes mellitus with moderate nonproliferative diabetic retinopathy with macular edema, left eye: Secondary | ICD-10-CM

## 2021-06-17 DIAGNOSIS — R69 Illness, unspecified: Secondary | ICD-10-CM | POA: Diagnosis not present

## 2021-06-18 ENCOUNTER — Other Ambulatory Visit: Payer: Self-pay | Admitting: Internal Medicine

## 2021-06-18 DIAGNOSIS — M1A9XX1 Chronic gout, unspecified, with tophus (tophi): Secondary | ICD-10-CM

## 2021-06-19 NOTE — Progress Notes (Deleted)
Office Visit Note  Patient: Sabrina Baker             Date of Birth: 17-Jul-1940           MRN: [id number]             PCP: [name], MD Referring: [name], MD Visit Date: 06/20/2021   Subjective:  No chief complaint on file.   History of Present Illness: Sabrina Baker is a 81 y.o. female here for follow up for erosive, tophaceous gout having started and titrated allopurinol to 300 mg daily and colchicine 0.3 mg.***   Previous HPI 01/21/21 Sabrina Baker is a 81 y.o. female here for erosive tophaceous gout.  She has longstanding but very infrequent history of gout limited to her lower extremity in the past.  In 2020 when hospitalized with COVID illness she experienced acute gouty arthritis of the right thumb with severe pain and swelling.  This had subsided with no persistent symptoms but she redeveloped right thumb pain and swelling worsening in May.  She does not recall any critical illness associated with triggering this new flare.  Outside of the right thumb she is not experiencing any other particularly swollen red or abnormally painful sites.  Hand was evaluated by orthopedics clinic with Dr. Erlinda Hong findings felt to represent gouty arthritis x-ray showing severe erosive changes of the head of proximal phalanx and around the interphalangeal joint.  Lab findings show elevated white blood cell count of 15.4, uric acid of 11.2, mild and impairment of GFR on July labs.  She was started on allopurinol at 100 mg daily dose.   Labs reviewed Uric acid 11.2   Imaging reviewed 12/08/20 Xray right thumb Erosion of the 1st IP joint, cystic changes around MCP joint   No Rheumatology ROS completed.   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/19/2015   Gastroenteritis 04/17/2012   Dehydration 04/17/2012   Diabetes mellitus ([place]) 04/17/2012   HTN (hypertension) 04/17/2012   TIA (transient ischemic attack) 04/17/2012    Past Medical History:  Diagnosis Date   Acute diastolic congestive heart failure ([place])    Carotid stenosis    Carotid US 1/22: Bilateral ICA 1-39; follow-up as needed   Chronic kidney disease    stage 3   Diabetes mellitus without complication ([place])    Gout    Hypercholesteremia    Hypertension    Neuromuscular disorder ([place])    neuropathy   Osteopenia    Retinal detachment    TIA (transient ischemic attack)    Vitamin B12 deficiency     No family history on file. Past Surgical History:  Procedure Laterality Date   25 GAUGE PARS PLANA VITRECTOMY WITH 20 GAUGE MVR PORT Right 03/16/2015   Procedure: 25 GAUGE PARS PLANA VITRECTOMY WITH 23 GAUGE MVR PORT;  Surgeon: Hayden Pedro, MD;  Location: [facility];  Service: Ophthalmology;  Laterality:  Right;   ANKLE SURGERY     BREAST EXCISIONAL BIOPSY     BREAST LUMPECTOMY     BREAST LUMPECTOMY WITH RADIOACTIVE SEED LOCALIZATION Right 01/24/2016   Procedure: RIGHT BREAST LUMPECTOMY WITH RADIOACTIVE SEED LOCALIZATION;  Surgeon: Coralie Keens, MD;  Location: [facility];  Service: General;  Laterality: Right;   EYE SURGERY     detached retna X2   HEEL SPUR SURGERY  2012   INCISION AND DRAINAGE ABSCESS Right 11/21/2019   Procedure: INCISION AND DRAINAGE FOREHEAD & RIGHT EAR ABSCESS;  Surgeon: Jerrell Belfast, MD;  Location: [facility];  Service: ENT;  Laterality: Right;   INJECTION OF SILICONE OIL Right 40/98/1191   Procedure: Extraction OF SILICONE  OIL;  Surgeon: Hayden Pedro, MD;  Location: [facility];  Service: Ophthalmology;  Laterality: Right;   ORIF ANKLE FRACTURE Left 06/17/2014   Procedure: OPEN REDUCTION INTERNAL FIXATION (ORIF) LEFT BIMALLEOLAR ANKLE FRACTURE;  Surgeon: Marianna Payment, MD;  Location: [facility];  Service: Orthopedics;  Laterality: Left;   PHOTOCOAGULATION WITH LASER Right 03/16/2015   Procedure: PHOTOCOAGULATION WITH LASER;  Surgeon: Hayden Pedro, MD;  Location: [facility];  Service: Ophthalmology;  Laterality: Right;   SHOULDER ARTHROSCOPY Right    plates and screws   VITRECTOMY Right 03/16/2015   WRIST ARTHROPLASTY Left    plates and screws and bone graft   Social History   Social History Narrative   Not on file   Immunization History  Administered Date(s) Administered   Influenza, High Dose Seasonal PF 03/11/2018   Influenza-Unspecified 03/12/2015   Moderna SARS-COV2 Booster Vaccination 05/11/2020, 11/23/2020   Zoster Recombinat (Shingrix) 06/21/2017, 06/21/2017     Objective: Vital Signs: There were no vitals taken for this visit.   Physical Exam   Musculoskeletal Exam: ***  CDAI Exam: CDAI Score: -- Patient Global: --; Provider Global: -- Swollen: --; Tender: -- Joint Exam 06/20/2021   No joint exam has been documented for this visit   There is currently no information documented on the homunculus. Go to the Rheumatology activity and complete the homunculus joint exam.  Investigation: No additional findings.  Imaging: MM 3D SCREEN BREAST BILATERAL  Result Date: 06/07/2021 CLINICAL DATA:  Screening. EXAM: DIGITAL SCREENING BILATERAL MAMMOGRAM WITH TOMOSYNTHESIS AND CAD TECHNIQUE: Bilateral screening digital craniocaudal and mediolateral oblique mammograms were obtained. Bilateral screening digital breast tomosynthesis was performed. The images were evaluated with computer-aided detection. COMPARISON:  Previous exam(s). ACR Breast Density Category c: The breast tissue is heterogeneously  dense, which may obscure small masses. FINDINGS: There are no findings suspicious for malignancy. IMPRESSION: No mammographic evidence of malignancy. A result letter of this screening mammogram will be mailed directly to the patient. RECOMMENDATION: Screening mammogram in one year. (Code:SM-B-01Y) BI-RADS CATEGORY  1: Negative. Electronically Signed   By: Lajean Manes M.D.   On: 06/07/2021 14:05    Recent Labs: Lab Results  Component Value Date   WBC 10.4 03/22/2021   HGB 11.1 (L) 03/22/2021   PLT 393 03/22/2021   NA 139 03/22/2021   K 4.5 03/22/2021   CL 96 (L) 03/22/2021   CO2 35 (H) 03/22/2021   GLUCOSE 137 (H) 03/22/2021   BUN 16 03/22/2021   CREATININE 1.15 (H) 03/22/2021   BILITOT 0.5 03/22/2021   ALKPHOS 64 03/22/2021   AST 20 03/22/2021   ALT 17 03/22/2021   PROT 5.6 (L) 03/22/2021   ALBUMIN 2.9 (L) 03/22/2021   CALCIUM 8.3 (L) 03/22/2021   GFRAA 47 (L) 11/25/2019  Speciality Comments: No specialty comments available.  Procedures:  No procedures performed Allergies: Ramipril, Other, Cortisone, Niacin, Niacin and related, and Valsartan   Assessment / Plan:     Visit Diagnoses: No diagnosis found.  ***  Orders: No orders of the defined types were placed in this encounter.  No orders of the defined types were placed in this encounter.    Follow-Up Instructions: No follow-ups on file.   Collier Salina, MD  Note - This record has been created using [employer].  Chart creation errors have been sought, but may not always  have been located. Such creation errors do not reflect on  the standard of medical care.

## 2021-06-20 ENCOUNTER — Ambulatory Visit: Payer: Medicare Other | Admitting: Internal Medicine

## 2021-06-20 NOTE — Telephone Encounter (Signed)
Next Visit: 06/20/2021  Last Visit: 01/21/2021  Last Fill: 04/18/2021  DX: Tophaceous gout of joint   Current Dose per phone note 05/19/2021: allopurinol dose to 300 mg once daily  Labs: 05/16/2021 Uric acid is 7.3 an improvement from 7.9 but still significantly above goal of 6.  Okay to refill Allopurinol?

## 2021-06-22 ENCOUNTER — Telehealth: Payer: Self-pay | Admitting: Internal Medicine

## 2021-06-22 NOTE — Telephone Encounter (Signed)
Patient called the office requesting a refill of Allopurinol to be sent to [facility] in [facility].

## 2021-06-22 NOTE — Telephone Encounter (Signed)
Left message to advise patient prescription was sent to the pharmacy on 06/21/2021.

## 2021-06-24 DIAGNOSIS — R69 Illness, unspecified: Secondary | ICD-10-CM | POA: Diagnosis not present

## 2021-07-06 ENCOUNTER — Other Ambulatory Visit: Payer: Self-pay

## 2021-07-06 ENCOUNTER — Encounter: Payer: Self-pay | Admitting: Internal Medicine

## 2021-07-06 ENCOUNTER — Ambulatory Visit (INDEPENDENT_AMBULATORY_CARE_PROVIDER_SITE_OTHER): Payer: Commercial Managed Care - HMO | Admitting: Internal Medicine

## 2021-07-06 VITALS — BP 124/84 | HR 66 | Ht 63.0 in | Wt 148.4 lb

## 2021-07-06 DIAGNOSIS — M1000 Idiopathic gout, unspecified site: Secondary | ICD-10-CM | POA: Diagnosis not present

## 2021-07-06 DIAGNOSIS — M1A9XX1 Chronic gout, unspecified, with tophus (tophi): Secondary | ICD-10-CM

## 2021-07-06 DIAGNOSIS — R69 Illness, unspecified: Secondary | ICD-10-CM | POA: Diagnosis not present

## 2021-07-06 NOTE — Progress Notes (Signed)
Office Visit Note  Patient: Sabrina Baker             Date of Birth: 20-Sep-1940           MRN: [id number]             PCP: [name], MD Referring: [name], MD Visit Date: 07/06/2021   Subjective:   History of Present Illness: Sabrina Baker is a 81 y.o. 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 She has increased her medication dose since the last visit without incident. Her right thumb IP joint remains enlarged with limited mobility but not causing much pain, no repeat episodes of redness and swelling. She is following up with Dr. Tempie Donning for her hand arthritis and deformity, currently observing unless it becomes unstable. She reports somewhat new or increased pain affecting both distal legs.  Previous HPI 01/21/21 Sabrina Baker is a 81 y.o. female here for erosive tophaceous gout.  She has longstanding but very infrequent history of gout limited to her lower extremity in the past.  In 2020 when hospitalized with COVID illness she experienced acute gouty arthritis of the right thumb with severe pain and swelling.  This had subsided with no persistent symptoms but she redeveloped right thumb pain and swelling worsening in May.  She does not recall any critical illness associated with triggering this new flare.  Outside of the right thumb she is not experiencing any other particularly swollen red or abnormally painful sites.  Hand was evaluated by orthopedics clinic with Dr. Erlinda Hong findings felt to represent gouty arthritis x-ray showing severe erosive changes of the head of proximal phalanx and around the interphalangeal joint.  Lab findings show elevated white blood cell count of 15.4, uric acid of 11.2, mild and impairment of GFR on July labs.  She was started on allopurinol at 100 mg daily dose.   Review of Systems  Constitutional:  Positive for fatigue.  HENT:  Negative for mouth sores, mouth dryness and nose  dryness.   Eyes:  Negative for pain, itching and dryness.  Respiratory:  Negative for shortness of breath and difficulty breathing.   Cardiovascular:  Negative for chest pain and palpitations.  Gastrointestinal:  Negative for blood in stool, constipation and diarrhea.  Endocrine: Negative for increased urination.  Genitourinary:  Negative for difficulty urinating.  Musculoskeletal:  Positive for joint pain, joint pain, myalgias, muscle weakness, morning stiffness, muscle tenderness and myalgias. Negative for joint swelling.  Skin:  Positive for rash. Negative for color change.  Allergic/Immunologic: Negative for susceptible to infections.  Neurological:  Negative for dizziness, numbness, headaches and weakness.  Hematological:  Positive for bruising/bleeding tendency.  Psychiatric/Behavioral:  Negative for confusion.    PMFS History:  Patient Active Problem List   Diagnosis Date Noted   Acute diastolic congestive heart failure (HCC)    Pericardial effusion    Aortic valve stenosis    Hypoxia    Acute idiopathic gout    Facial cellulitis 03/04/2021   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   Staph aureus infection 11/20/2019   Cellulitis 11/19/2019   Atrial fibrillation ([place]) 12/23/2018   ARDS (adult respiratory distress syndrome) ([place])    COVID-19    Mixed dyslipidemia 07/10/2016   Osteopenia 07/10/2016   Vitamin B12 deficiency 07/10/2016   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/19/2015   Gastroenteritis 04/17/2012   Dehydration 04/17/2012   Diabetes mellitus ([place]) 04/17/2012  HTN (hypertension) 04/17/2012   TIA (transient ischemic attack) 04/17/2012    Past Medical History:  Diagnosis Date   Acute diastolic congestive heart failure ([place])    Carotid stenosis    Carotid US 1/22: Bilateral ICA 1-39; follow-up as needed   Chronic kidney disease    stage 3   Diabetes mellitus without complication (HCC)    Gout    Hypercholesteremia    Hypertension     Neuromuscular disorder (HCC)    neuropathy   Osteopenia    Retinal detachment    TIA (transient ischemic attack)    Vitamin B12 deficiency     History reviewed. No pertinent family history. Past Surgical History:  Procedure Laterality Date   25 GAUGE PARS PLANA VITRECTOMY WITH 20 GAUGE MVR PORT Right 03/16/2015   Procedure: 25 GAUGE PARS PLANA VITRECTOMY WITH 23 GAUGE MVR PORT;  Surgeon: Hayden Pedro, MD;  Location: [facility];  Service: Ophthalmology;  Laterality: Right;   ANKLE SURGERY     BREAST EXCISIONAL BIOPSY     BREAST LUMPECTOMY     BREAST LUMPECTOMY WITH RADIOACTIVE SEED LOCALIZATION Right 01/24/2016   Procedure: RIGHT BREAST LUMPECTOMY WITH RADIOACTIVE SEED LOCALIZATION;  Surgeon: Coralie Keens, MD;  Location: [facility];  Service: General;  Laterality: Right;   EYE SURGERY     detached retna X2   HEEL SPUR SURGERY  2012   INCISION AND DRAINAGE ABSCESS Right 11/21/2019   Procedure: INCISION AND DRAINAGE FOREHEAD & RIGHT EAR ABSCESS;  Surgeon: Jerrell Belfast, MD;  Location: [facility];  Service: ENT;  Laterality: Right;   INJECTION OF SILICONE OIL Right 32/20/2542   Procedure: Extraction OF SILICONE OIL;  Surgeon: Hayden Pedro, MD;  Location: [facility];  Service: Ophthalmology;  Laterality: Right;   ORIF ANKLE FRACTURE Left 06/17/2014   Procedure: OPEN REDUCTION INTERNAL FIXATION (ORIF) LEFT BIMALLEOLAR ANKLE FRACTURE;  Surgeon: Marianna Payment, MD;  Location: [facility];  Service: Orthopedics;  Laterality: Left;   PHOTOCOAGULATION WITH LASER Right 03/16/2015   Procedure: PHOTOCOAGULATION WITH LASER;  Surgeon: Hayden Pedro, MD;  Location: [facility];  Service: Ophthalmology;  Laterality: Right;   SHOULDER ARTHROSCOPY Right    plates and screws   VITRECTOMY Right 03/16/2015   WRIST ARTHROPLASTY Left    plates and screws and bone graft   Social History   Social History Narrative   Not on file   Immunization History  Administered Date(s) Administered    Influenza, High Dose Seasonal PF 03/11/2018   Influenza-Unspecified 03/12/2015   Moderna SARS-COV2 Booster Vaccination 05/11/2020, 11/23/2020   Zoster Recombinat (Shingrix) 06/21/2017, 06/21/2017     Objective: Vital Signs: BP 124/84 (BP Location: Left Arm, Patient Position: Sitting, Cuff Size: Normal)    Pulse 66    Ht 5\' 3"  (1.6 m)    Wt 148 lb 6.4 oz (67.3 kg)    BMI 26.29 kg/m    Physical Exam Cardiovascular:     Rate and Rhythm: Normal rate and regular rhythm.  Pulmonary:     Effort: Pulmonary effort is normal.     Breath sounds: Normal breath sounds.  Skin:    General: Skin is warm and dry.     Findings: No rash.  Neurological:     Mental Status: She is alert.  Psychiatric:        Mood and Affect: Mood normal.     Musculoskeletal Exam:  Shoulders full ROM no tenderness or swelling Elbows full ROM no tenderness or swelling Wrists full  ROM no tenderness or swelling Heberdon's nodes on bilateral hands, right thumb enlarged soft tissue and instability of IP joint, no focal tenderness, swelling, redness, or warmth, left 3rd digit with nonreducible 3rd PIP joint flexion Knees full ROM no tenderness or swelling Bilateral charcot foot deformities, no palpable swelling or overlying edema   Investigation: No additional findings.  Imaging: No results found.  Recent Labs: Lab Results  Component Value Date   WBC 10.4 03/22/2021   HGB 11.1 (L) 03/22/2021   PLT 393 03/22/2021   NA 139 03/22/2021   K 4.5 03/22/2021   CL 96 (L) 03/22/2021   CO2 35 (H) 03/22/2021   GLUCOSE 137 (H) 03/22/2021   BUN 16 03/22/2021   CREATININE 1.15 (H) 03/22/2021   BILITOT 0.5 03/22/2021   ALKPHOS 64 03/22/2021   AST 20 03/22/2021   ALT 17 03/22/2021   PROT 5.6 (L) 03/22/2021   ALBUMIN 2.9 (L) 03/22/2021   CALCIUM 8.3 (L) 03/22/2021   GFRAA 47 (L) 11/25/2019    Speciality Comments: No specialty comments available.  Procedures:  No procedures performed Allergies: Ramipril, Other,  Cortisone, Niacin, Niacin and related, and Valsartan   Assessment / Plan:     Visit Diagnoses: Tophaceous gout of joint Acute idiopathic gout, unspecified site - Plan: Uric acid  Chronic tophaceous gout in the thumb but no other joint with active swelling and no severe pain bothering her. Rechecking uric acid level today now on 300 mg allopurinol since last month. If this is at goal I will recommend stopping the maintenance colchicine since she has somewhat increased risk for long term use due to CKD. If above goal will titrate up and need sooner f/u.  Orders: Orders Placed This Encounter  Procedures   Uric acid   No orders of the defined types were placed in this encounter.    Follow-Up Instructions: Return in about 1 year (around 07/06/2022) for Gout on allopurinol f/u 1 yr.   Collier Salina, MD  Note - This record has been created using [employer].  Chart creation errors have been sought, but may not always  have been located. Such creation errors do not reflect on  the standard of medical care.

## 2021-07-07 LAB — URIC ACID: Uric Acid, Serum: 5.3 mg/dL (ref 2.5–7.0)

## 2021-07-07 NOTE — Progress Notes (Signed)
Uric acid level is 5.3 this is at goal. She can continue the current allopurinol 300 mg daily and stop taking the colchicine routinely, just as needed for a flare up of her gout.

## 2021-07-11 DIAGNOSIS — R69 Illness, unspecified: Secondary | ICD-10-CM | POA: Diagnosis not present

## 2021-07-13 ENCOUNTER — Other Ambulatory Visit: Payer: Medicare Other

## 2021-07-13 ENCOUNTER — Ambulatory Visit: Payer: Medicare Other | Admitting: Podiatry

## 2021-07-16 ENCOUNTER — Other Ambulatory Visit: Payer: Self-pay | Admitting: Internal Medicine

## 2021-07-16 DIAGNOSIS — M1A9XX1 Chronic gout, unspecified, with tophus (tophi): Secondary | ICD-10-CM

## 2021-07-19 ENCOUNTER — Other Ambulatory Visit: Payer: Self-pay

## 2021-07-19 ENCOUNTER — Encounter (INDEPENDENT_AMBULATORY_CARE_PROVIDER_SITE_OTHER): Payer: Medicare Other | Admitting: Ophthalmology

## 2021-07-19 ENCOUNTER — Encounter (INDEPENDENT_AMBULATORY_CARE_PROVIDER_SITE_OTHER): Payer: Commercial Managed Care - HMO | Admitting: Ophthalmology

## 2021-07-19 DIAGNOSIS — H35372 Puckering of macula, left eye: Secondary | ICD-10-CM

## 2021-07-19 DIAGNOSIS — I10 Essential (primary) hypertension: Secondary | ICD-10-CM | POA: Diagnosis not present

## 2021-07-19 DIAGNOSIS — H43812 Vitreous degeneration, left eye: Secondary | ICD-10-CM | POA: Diagnosis not present

## 2021-07-19 DIAGNOSIS — H338 Other retinal detachments: Secondary | ICD-10-CM | POA: Diagnosis not present

## 2021-07-19 DIAGNOSIS — H35033 Hypertensive retinopathy, bilateral: Secondary | ICD-10-CM | POA: Diagnosis not present

## 2021-07-19 DIAGNOSIS — E113312 Type 2 diabetes mellitus with moderate nonproliferative diabetic retinopathy with macular edema, left eye: Secondary | ICD-10-CM

## 2021-07-19 DIAGNOSIS — E113511 Type 2 diabetes mellitus with proliferative diabetic retinopathy with macular edema, right eye: Secondary | ICD-10-CM | POA: Diagnosis not present

## 2021-07-20 ENCOUNTER — Ambulatory Visit: Payer: Medicare Other | Admitting: Podiatry

## 2021-07-20 DIAGNOSIS — N1832 Chronic kidney disease, stage 3b: Secondary | ICD-10-CM | POA: Diagnosis not present

## 2021-07-25 DIAGNOSIS — N390 Urinary tract infection, site not specified: Secondary | ICD-10-CM | POA: Diagnosis not present

## 2021-07-25 DIAGNOSIS — N2581 Secondary hyperparathyroidism of renal origin: Secondary | ICD-10-CM | POA: Diagnosis not present

## 2021-07-25 DIAGNOSIS — E1122 Type 2 diabetes mellitus with diabetic chronic kidney disease: Secondary | ICD-10-CM | POA: Diagnosis not present

## 2021-07-25 DIAGNOSIS — I129 Hypertensive chronic kidney disease with stage 1 through stage 4 chronic kidney disease, or unspecified chronic kidney disease: Secondary | ICD-10-CM | POA: Diagnosis not present

## 2021-07-25 DIAGNOSIS — N1832 Chronic kidney disease, stage 3b: Secondary | ICD-10-CM | POA: Diagnosis not present

## 2021-07-25 DIAGNOSIS — D631 Anemia in chronic kidney disease: Secondary | ICD-10-CM | POA: Diagnosis not present

## 2021-07-27 ENCOUNTER — Encounter: Payer: Self-pay | Admitting: Podiatry

## 2021-07-27 ENCOUNTER — Ambulatory Visit (INDEPENDENT_AMBULATORY_CARE_PROVIDER_SITE_OTHER): Payer: Medicare Other

## 2021-07-27 ENCOUNTER — Ambulatory Visit (INDEPENDENT_AMBULATORY_CARE_PROVIDER_SITE_OTHER): Payer: Medicare Other | Admitting: Podiatry

## 2021-07-27 ENCOUNTER — Other Ambulatory Visit: Payer: Self-pay

## 2021-07-27 DIAGNOSIS — E11610 Type 2 diabetes mellitus with diabetic neuropathic arthropathy: Secondary | ICD-10-CM | POA: Diagnosis not present

## 2021-07-27 DIAGNOSIS — B351 Tinea unguium: Secondary | ICD-10-CM | POA: Diagnosis not present

## 2021-07-27 DIAGNOSIS — M79674 Pain in right toe(s): Secondary | ICD-10-CM | POA: Diagnosis not present

## 2021-07-27 DIAGNOSIS — M79675 Pain in left toe(s): Secondary | ICD-10-CM | POA: Diagnosis not present

## 2021-07-27 DIAGNOSIS — M2042 Other hammer toe(s) (acquired), left foot: Secondary | ICD-10-CM

## 2021-07-27 DIAGNOSIS — E1142 Type 2 diabetes mellitus with diabetic polyneuropathy: Secondary | ICD-10-CM

## 2021-07-27 DIAGNOSIS — E1165 Type 2 diabetes mellitus with hyperglycemia: Secondary | ICD-10-CM | POA: Diagnosis not present

## 2021-07-27 DIAGNOSIS — M2041 Other hammer toe(s) (acquired), right foot: Secondary | ICD-10-CM

## 2021-07-27 DIAGNOSIS — Z794 Long term (current) use of insulin: Secondary | ICD-10-CM | POA: Diagnosis not present

## 2021-07-27 NOTE — Progress Notes (Signed)
°  Subjective:  Patient ID: Sabrina Baker, female    DOB: [birth year],  MRN: [id number]  Chief Complaint  Patient presents with   Nail Problem    Nail trim    81 y.o. female returns for the above complaint.  Patient presents with thickened elongated dystrophic toenails x10.  Pain on palpation.  Patient would like for me to debride down which is not able to do it herself.  She denies any other acute complaints.  Objective:  There were no vitals filed for this visit. Podiatric Exam: Vascular: dorsalis pedis and posterior tibial pulses are palpable bilateral. Capillary return is immediate. Temperature gradient is WNL. Skin turgor WNL  Sensorium: Normal Semmes Weinstein monofilament test. Normal tactile sensation bilaterally. Nail Exam: Pt has thick disfigured discolored nails with subungual debris noted bilateral entire nail hallux through fifth toenails.  Pain on palpation to the nails. Ulcer Exam: There is no evidence of ulcer or pre-ulcerative changes or infection. Orthopedic Exam: Muscle tone and strength are WNL. No limitations in general ROM. No crepitus or effusions noted. HAV  B/L.  Hammer toes 2-5  B/L. Skin: No Porokeratosis. No infection or ulcers    Assessment & Plan:   1. Pain due to onychomycosis of toenails of both feet   2. Diabetic peripheral neuropathy associated with type 2 diabetes mellitus ([place])      Patient was evaluated and treated and all questions answered.  Hammertoes 2 through 5 bilateral -I explained to the patient the etiology of hammertoe contractures and various treatment options were discussed.  Given the amount of pain she is having I believe patient will benefit from diabetic shoes to prevent ulceration given the contractures.  She will follow-up with [facility] for diabetic shoes.   Onychomycosis with pain  -Nails palliatively debrided as below. -Educated on self-care  Procedure: Nail Debridement Rationale: pain  Type of Debridement: manual, sharp  debridement. Instrumentation: Nail nipper, rotary burr. Number of Nails: 10  Procedures and Treatment: Consent by patient was obtained for treatment procedures. The patient understood the discussion of treatment and procedures well. All questions were answered thoroughly reviewed. Debridement of mycotic and hypertrophic toenails, 1 through 5 bilateral and clearing of subungual debris. No ulceration, no infection noted.  Return Visit-Office Procedure: Patient instructed to return to the office for a follow up visit 3 months for continued evaluation and treatment.  Boneta Lucks, DPM    No follow-ups on file.

## 2021-07-27 NOTE — Progress Notes (Signed)
SITUATION Reason for Visit: Fitting of Diabetic Shoes & Insoles Patient / Caregiver Report:  Patient is satisfied with fit an dfunction  OBJECTIVE DATA: Patient History / Diagnosis:     ICD-10-CM   1. Diabetic peripheral neuropathy associated with type 2 diabetes mellitus (HCC)  E11.42     2. 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 Hammer toes of both feet  M20.41    M20.42       Change in Status:   None  ACTIONS PERFORMED: In-Person Delivery, patient was fit with: - 1x pair A5500 PDAC approved prefabricated Diabetic Shoes: Apex Ambulator Velcro - 3x pair [id number] PDAC approved CAM milled custom diabetic insoles - [employer] and insoles were verified for structural integrity and safety. Patient wore shoes and insoles in office. Skin was inspected and free of areas of concern after wearing shoes and inserts. Shoes and inserts fit properly. Patient / Caregiver provided with ferbal instruction and demonstration regarding donning, doffing, wear, care, proper fit, function, purpose, cleaning, and use of shoes and insoles ' and in all related precautions and risks and benefits regarding shoes and insoles. Patient / Caregiver was instructed to wear properly fitting socks with shoes at all times. Patient was also provided with verbal instruction regarding how to report any failures or malfunctions of shoes or inserts, and necessary follow up care. Patient / Caregiver was also instructed to contact physician regarding change in status that may affect function of shoes and inserts.   Patient / Caregiver verbalized undersatnding of instruction provided. Patient / Caregiver demonstrated independence with proper donning and doffing of shoes and inserts.  PLAN Patient to follow up as needed. Plan of care was discussed with and agreed upon by patient and/or caregiver. All questions were answered and concerns addressed.

## 2021-07-28 DIAGNOSIS — R1031 Right lower quadrant pain: Secondary | ICD-10-CM | POA: Diagnosis not present

## 2021-07-28 DIAGNOSIS — G894 Chronic pain syndrome: Secondary | ICD-10-CM | POA: Diagnosis not present

## 2021-07-28 DIAGNOSIS — M1A0690 Idiopathic chronic gout, unspecified knee, without tophus (tophi): Secondary | ICD-10-CM | POA: Diagnosis not present

## 2021-07-28 DIAGNOSIS — R634 Abnormal weight loss: Secondary | ICD-10-CM | POA: Diagnosis not present

## 2021-07-28 DIAGNOSIS — I129 Hypertensive chronic kidney disease with stage 1 through stage 4 chronic kidney disease, or unspecified chronic kidney disease: Secondary | ICD-10-CM | POA: Diagnosis not present

## 2021-07-28 DIAGNOSIS — M1A9XX1 Chronic gout, unspecified, with tophus (tophi): Secondary | ICD-10-CM | POA: Diagnosis not present

## 2021-07-28 DIAGNOSIS — D6869 Other thrombophilia: Secondary | ICD-10-CM | POA: Diagnosis not present

## 2021-07-28 DIAGNOSIS — E1149 Type 2 diabetes mellitus with other diabetic neurological complication: Secondary | ICD-10-CM | POA: Diagnosis not present

## 2021-07-28 DIAGNOSIS — E785 Hyperlipidemia, unspecified: Secondary | ICD-10-CM | POA: Diagnosis not present

## 2021-07-28 DIAGNOSIS — L739 Follicular disorder, unspecified: Secondary | ICD-10-CM | POA: Diagnosis not present

## 2021-07-28 DIAGNOSIS — N183 Chronic kidney disease, stage 3 unspecified: Secondary | ICD-10-CM | POA: Diagnosis not present

## 2021-07-28 DIAGNOSIS — I480 Paroxysmal atrial fibrillation: Secondary | ICD-10-CM | POA: Diagnosis not present

## 2021-08-02 ENCOUNTER — Ambulatory Visit: Payer: Medicare Other

## 2021-08-02 ENCOUNTER — Other Ambulatory Visit: Payer: Self-pay

## 2021-08-02 DIAGNOSIS — L97511 Non-pressure chronic ulcer of other part of right foot limited to breakdown of skin: Secondary | ICD-10-CM

## 2021-08-02 DIAGNOSIS — E1142 Type 2 diabetes mellitus with diabetic polyneuropathy: Secondary | ICD-10-CM

## 2021-08-02 DIAGNOSIS — M2041 Other hammer toe(s) (acquired), right foot: Secondary | ICD-10-CM

## 2021-08-02 DIAGNOSIS — E11610 Type 2 diabetes mellitus with diabetic neuropathic arthropathy: Secondary | ICD-10-CM

## 2021-08-03 ENCOUNTER — Telehealth: Payer: Self-pay

## 2021-08-03 NOTE — Telephone Encounter (Signed)
Pt called and stated that she would like to have to shoes original shoes she ordered 2 years ago with [facility] she stated that the current shoes are not comfortable for her and the ones that were ordered are heavier. She stated that she would also like to have new inserts made with the original mold that was made, as it does not seem to be correct either due to her charcot.

## 2021-08-04 NOTE — Progress Notes (Signed)
SITUATION Reason for Consult: Follow-up with diabetic shoes and insoles Patient / Caregiver Report: Patient is unsatisfied with fit and function of shoes and insoles  OBJECTIVE DATA History / Diagnosis:    ICD-10-CM   1. Diabetic peripheral neuropathy associated with type 2 diabetes mellitus (HCC)  E11.42     2. Diabetic Charcot foot ([place])  E11.610     3. Hammer toes of both feet  M20.41    M20.42     4. Skin ulcer of fourth toe, limited to breakdown of skin, right ([place])  L97.511       Change in Pathology: None  ACTIONS PERFORMED Patient's equipment was checked for structural stability and fit. Patient reports she does not like the feel of the new insoles and she feels the tongue and topline is too high. Patient would like a low-cut black leather sneaker similar to what she is currently using. Accepted shoes for return and a different pair will be ordered. Modified existing insoles to increase offloading. Fit insoles to patient's previous shoes. Device(s) intact and fit is excellent. All questions answered and concerns addressed.  PLAN Follow-up as needed (PRN). Plan of care discussed with and agreed upon by patient / caregiver.

## 2021-08-05 DIAGNOSIS — Z794 Long term (current) use of insulin: Secondary | ICD-10-CM | POA: Diagnosis not present

## 2021-08-05 DIAGNOSIS — E1165 Type 2 diabetes mellitus with hyperglycemia: Secondary | ICD-10-CM | POA: Diagnosis not present

## 2021-08-05 DIAGNOSIS — Z7984 Long term (current) use of oral hypoglycemic drugs: Secondary | ICD-10-CM | POA: Diagnosis not present

## 2021-08-05 DIAGNOSIS — E1122 Type 2 diabetes mellitus with diabetic chronic kidney disease: Secondary | ICD-10-CM | POA: Diagnosis not present

## 2021-08-05 DIAGNOSIS — N189 Chronic kidney disease, unspecified: Secondary | ICD-10-CM | POA: Diagnosis not present

## 2021-08-05 DIAGNOSIS — E1140 Type 2 diabetes mellitus with diabetic neuropathy, unspecified: Secondary | ICD-10-CM | POA: Diagnosis not present

## 2021-08-05 DIAGNOSIS — Z7985 Long-term (current) use of injectable non-insulin antidiabetic drugs: Secondary | ICD-10-CM | POA: Diagnosis not present

## 2021-08-08 ENCOUNTER — Other Ambulatory Visit: Payer: Self-pay | Admitting: Internal Medicine

## 2021-08-08 DIAGNOSIS — M1A9XX1 Chronic gout, unspecified, with tophus (tophi): Secondary | ICD-10-CM

## 2021-08-08 MED ORDER — ALLOPURINOL 300 MG PO TABS: 300.0000 mg | ORAL_TABLET | Freq: Every day | ORAL | 3 refills | Status: DC

## 2021-08-08 NOTE — Telephone Encounter (Signed)
Next Visit: 07/05/2022  Last Visit: 07/06/2021  Last Fill:   DX: Tophaceous gout of joint  Current Dose per office note 07/06/2021: 300 mg allopurinol since last month  Labs: 07/27/2021 Chloride 97, Glucose 169, Creat. 1.50, Total Protein, GFR 35  Okay to refill Allopurinol?

## 2021-08-08 NOTE — Telephone Encounter (Signed)
Patient called the office requesting a refill of Allopurinol 300mg  to be sent to [facility] [place]. Patient states she normally gets a 90 day supply of all of her medications and would like 90 days if possible.

## 2021-08-09 NOTE — Telephone Encounter (Signed)
Spoke with pt and advised her that will need to come in to redo her eval. She stated that she would call back when she was able to come back in.

## 2021-08-10 ENCOUNTER — Other Ambulatory Visit: Payer: Self-pay

## 2021-08-10 ENCOUNTER — Ambulatory Visit: Payer: Medicare Other

## 2021-08-10 DIAGNOSIS — L97511 Non-pressure chronic ulcer of other part of right foot limited to breakdown of skin: Secondary | ICD-10-CM

## 2021-08-10 DIAGNOSIS — M2041 Other hammer toe(s) (acquired), right foot: Secondary | ICD-10-CM

## 2021-08-10 DIAGNOSIS — E1142 Type 2 diabetes mellitus with diabetic polyneuropathy: Secondary | ICD-10-CM

## 2021-08-10 DIAGNOSIS — M2042 Other hammer toe(s) (acquired), left foot: Secondary | ICD-10-CM

## 2021-08-10 DIAGNOSIS — E11610 Type 2 diabetes mellitus with diabetic neuropathic arthropathy: Secondary | ICD-10-CM

## 2021-08-10 NOTE — Progress Notes (Signed)
SITUATION ?Reason for Consult: Follow-up with diabetic shoes and insoles ?Patient / Caregiver Report: Patient selects New Balance 813 Black lace ? ?OBJECTIVE DATA ?History / Diagnosis:  ?  ICD-10-CM   ?1. Diabetic peripheral neuropathy associated with type 2 diabetes mellitus ([place])  E11.42   ?  ?2. Diabetic Charcot foot ([place])  E11.610   ?  ?3. Hammer toes of both feet  M20.41   ? M20.42   ?  ?4. Skin ulcer of fourth toe, limited to breakdown of skin, right ([place])  L97.511   ?  ? ? ?Change in Pathology: None ? ? ?ACTIONS PERFORMED ?Patient's equipment was checked for structural stability and fit. Patient's shoes are no longer available for order. Patient selected [employer]. Re-casted patient with areas of concern marked and detailed instructions for [facility] for fabrication for high walled insoles medial and lateral. Device(s) intact and fit is excellent. All questions answered and concerns addressed. ? ?PLAN ?Patient to be contacted when items in and ready. Plan of care discussed with and agreed upon by patient / caregiver. ? ?

## 2021-08-11 ENCOUNTER — Telehealth: Payer: Self-pay

## 2021-08-11 NOTE — Telephone Encounter (Signed)
Casts sent to [employer] ?

## 2021-08-11 NOTE — Telephone Encounter (Signed)
Ordered Replacement Shoes - NB 813 black lace 7W ?

## 2021-08-12 ENCOUNTER — Other Ambulatory Visit: Payer: Self-pay | Admitting: Internal Medicine

## 2021-08-12 DIAGNOSIS — M1A9XX1 Chronic gout, unspecified, with tophus (tophi): Secondary | ICD-10-CM

## 2021-08-12 MED ORDER — ALLOPURINOL 100 MG PO TABS: 300.0000 mg | ORAL_TABLET | Freq: Every day | ORAL | 1 refills | Status: DC

## 2021-08-12 NOTE — Progress Notes (Signed)
New Rx sent substituting 100 mg allopurinol due to 300 [facility]. ?

## 2021-08-17 ENCOUNTER — Other Ambulatory Visit: Payer: Self-pay

## 2021-08-17 ENCOUNTER — Encounter (INDEPENDENT_AMBULATORY_CARE_PROVIDER_SITE_OTHER): Payer: Medicare Other | Admitting: Ophthalmology

## 2021-08-17 DIAGNOSIS — H338 Other retinal detachments: Secondary | ICD-10-CM | POA: Diagnosis not present

## 2021-08-17 DIAGNOSIS — I10 Essential (primary) hypertension: Secondary | ICD-10-CM

## 2021-08-17 DIAGNOSIS — E113591 Type 2 diabetes mellitus with proliferative diabetic retinopathy without macular edema, right eye: Secondary | ICD-10-CM | POA: Diagnosis not present

## 2021-08-17 DIAGNOSIS — H43812 Vitreous degeneration, left eye: Secondary | ICD-10-CM | POA: Diagnosis not present

## 2021-08-17 DIAGNOSIS — E113312 Type 2 diabetes mellitus with moderate nonproliferative diabetic retinopathy with macular edema, left eye: Secondary | ICD-10-CM

## 2021-08-17 DIAGNOSIS — H353122 Nonexudative age-related macular degeneration, left eye, intermediate dry stage: Secondary | ICD-10-CM | POA: Diagnosis not present

## 2021-08-17 DIAGNOSIS — H35033 Hypertensive retinopathy, bilateral: Secondary | ICD-10-CM | POA: Diagnosis not present

## 2021-08-25 ENCOUNTER — Other Ambulatory Visit: Payer: Self-pay

## 2021-08-25 ENCOUNTER — Other Ambulatory Visit: Payer: Self-pay | Admitting: Physician Assistant

## 2021-08-25 ENCOUNTER — Ambulatory Visit
Admission: RE | Admit: 2021-08-25 | Discharge: 2021-08-25 | Disposition: A | Discharge status: Home / Self Care | Payer: Medicare Other | Source: Ambulatory Visit | Attending: Physician Assistant | Admitting: Physician Assistant

## 2021-08-25 DIAGNOSIS — R1032 Left lower quadrant pain: Secondary | ICD-10-CM | POA: Diagnosis not present

## 2021-08-25 DIAGNOSIS — R1031 Right lower quadrant pain: Secondary | ICD-10-CM | POA: Diagnosis not present

## 2021-08-25 DIAGNOSIS — R109 Unspecified abdominal pain: Secondary | ICD-10-CM | POA: Diagnosis not present

## 2021-08-26 ENCOUNTER — Ambulatory Visit
Admission: RE | Admit: 2021-08-26 | Discharge: 2021-08-26 | Disposition: A | Discharge status: Home / Self Care | Payer: Medicare Other | Source: Ambulatory Visit | Attending: Family Medicine | Admitting: Family Medicine

## 2021-08-26 ENCOUNTER — Other Ambulatory Visit: Payer: Self-pay | Admitting: Family Medicine

## 2021-08-26 DIAGNOSIS — K5900 Constipation, unspecified: Secondary | ICD-10-CM | POA: Diagnosis not present

## 2021-08-26 DIAGNOSIS — R1032 Left lower quadrant pain: Secondary | ICD-10-CM

## 2021-08-26 DIAGNOSIS — D72119 Hypereosinophilic syndrome (hes), unspecified: Secondary | ICD-10-CM

## 2021-08-29 ENCOUNTER — Telehealth: Payer: Self-pay

## 2021-08-30 NOTE — Telephone Encounter (Signed)
error 

## 2021-08-31 DIAGNOSIS — N289 Disorder of kidney and ureter, unspecified: Secondary | ICD-10-CM | POA: Diagnosis not present

## 2021-08-31 DIAGNOSIS — D72829 Elevated white blood cell count, unspecified: Secondary | ICD-10-CM | POA: Diagnosis not present

## 2021-09-07 DIAGNOSIS — D72829 Elevated white blood cell count, unspecified: Secondary | ICD-10-CM | POA: Diagnosis not present

## 2021-09-14 ENCOUNTER — Encounter (INDEPENDENT_AMBULATORY_CARE_PROVIDER_SITE_OTHER): Payer: Medicare Other | Admitting: Ophthalmology

## 2021-09-14 DIAGNOSIS — H353122 Nonexudative age-related macular degeneration, left eye, intermediate dry stage: Secondary | ICD-10-CM

## 2021-09-14 DIAGNOSIS — H35033 Hypertensive retinopathy, bilateral: Secondary | ICD-10-CM

## 2021-09-14 DIAGNOSIS — E113312 Type 2 diabetes mellitus with moderate nonproliferative diabetic retinopathy with macular edema, left eye: Secondary | ICD-10-CM

## 2021-09-14 DIAGNOSIS — E113591 Type 2 diabetes mellitus with proliferative diabetic retinopathy without macular edema, right eye: Secondary | ICD-10-CM

## 2021-09-14 DIAGNOSIS — I10 Essential (primary) hypertension: Secondary | ICD-10-CM | POA: Diagnosis not present

## 2021-09-14 DIAGNOSIS — H35372 Puckering of macula, left eye: Secondary | ICD-10-CM

## 2021-09-14 DIAGNOSIS — H338 Other retinal detachments: Secondary | ICD-10-CM | POA: Diagnosis not present

## 2021-09-14 DIAGNOSIS — H43812 Vitreous degeneration, left eye: Secondary | ICD-10-CM

## 2021-09-21 ENCOUNTER — Ambulatory Visit: Payer: Medicare Other

## 2021-09-21 DIAGNOSIS — M2041 Other hammer toe(s) (acquired), right foot: Secondary | ICD-10-CM

## 2021-09-21 DIAGNOSIS — E11610 Type 2 diabetes mellitus with diabetic neuropathic arthropathy: Secondary | ICD-10-CM

## 2021-09-21 DIAGNOSIS — S41112A Laceration without foreign body of left upper arm, initial encounter: Secondary | ICD-10-CM | POA: Diagnosis not present

## 2021-09-21 DIAGNOSIS — D72829 Elevated white blood cell count, unspecified: Secondary | ICD-10-CM | POA: Diagnosis not present

## 2021-09-21 DIAGNOSIS — E1142 Type 2 diabetes mellitus with diabetic polyneuropathy: Secondary | ICD-10-CM

## 2021-09-21 NOTE — Progress Notes (Signed)
SITUATION ?Reason for Visit: Fitting of Diabetic [place] ?Patient / Caregiver Report:  Patient is satisfied with fit and function of shoes and insoles. ? ?OBJECTIVE DATA: ?Patient History / Diagnosis:   ?  ICD-10-CM   ?1. Diabetic peripheral neuropathy associated with type 2 diabetes mellitus ([place])  E11.42   ?  ?2. Diabetic Charcot foot ([place])  E11.610   ?  ?3. Hammer toes of both feet  M20.41   ? M20.42   ?  ? ? ?Change in Status:   None ? ?ACTIONS PERFORMED: ?In-Person Delivery, patient was fit with: ?- 1x pair A5500 PDAC approved prefabricated Diabetic Shoes: New Balance 813 Lace ?- 3x pair [phone] PDAC approved vacuum formed custom diabetic insoles; [employer]: [id number] ? ?Shoes and insoles were verified for structural integrity and safety. Patient wore shoes and insoles in office. Skin was inspected and free of areas of concern after wearing shoes and inserts. Shoes and inserts fit properly. Patient / Caregiver provided with ferbal instruction and demonstration regarding donning, doffing, wear, care, proper fit, function, purpose, cleaning, and use of shoes and insoles ' and in all related precautions and risks and benefits regarding shoes and insoles. Patient / Caregiver was instructed to wear properly fitting socks with shoes at all times. Patient was also provided with verbal instruction regarding how to report any failures or malfunctions of shoes or inserts, and necessary follow up care. Patient / Caregiver was also instructed to contact physician regarding change in status that may affect function of shoes and inserts.  ? ?Patient / Caregiver verbalized undersatnding of instruction provided. Patient / Caregiver demonstrated independence with proper donning and doffing of shoes and inserts. ? ?PLAN ?Patient to follow with treating physician as recommended. Plan of care was discussed with and agreed upon by patient and/or caregiver. All questions were answered and concerns addressed. ? ?

## 2021-10-07 DIAGNOSIS — H401113 Primary open-angle glaucoma, right eye, severe stage: Secondary | ICD-10-CM | POA: Diagnosis not present

## 2021-10-07 DIAGNOSIS — H40022 Open angle with borderline findings, high risk, left eye: Secondary | ICD-10-CM | POA: Diagnosis not present

## 2021-10-07 DIAGNOSIS — H16231 Neurotrophic keratoconjunctivitis, right eye: Secondary | ICD-10-CM | POA: Diagnosis not present

## 2021-10-07 DIAGNOSIS — H1711 Central corneal opacity, right eye: Secondary | ICD-10-CM | POA: Diagnosis not present

## 2021-10-07 DIAGNOSIS — Z961 Presence of intraocular lens: Secondary | ICD-10-CM | POA: Diagnosis not present

## 2021-10-10 DIAGNOSIS — D72829 Elevated white blood cell count, unspecified: Secondary | ICD-10-CM | POA: Diagnosis not present

## 2021-10-11 ENCOUNTER — Encounter (INDEPENDENT_AMBULATORY_CARE_PROVIDER_SITE_OTHER): Payer: Medicare Other | Admitting: Ophthalmology

## 2021-10-11 DIAGNOSIS — H338 Other retinal detachments: Secondary | ICD-10-CM | POA: Diagnosis not present

## 2021-10-11 DIAGNOSIS — I10 Essential (primary) hypertension: Secondary | ICD-10-CM | POA: Diagnosis not present

## 2021-10-11 DIAGNOSIS — H353122 Nonexudative age-related macular degeneration, left eye, intermediate dry stage: Secondary | ICD-10-CM

## 2021-10-11 DIAGNOSIS — E113591 Type 2 diabetes mellitus with proliferative diabetic retinopathy without macular edema, right eye: Secondary | ICD-10-CM | POA: Diagnosis not present

## 2021-10-11 DIAGNOSIS — H43812 Vitreous degeneration, left eye: Secondary | ICD-10-CM | POA: Diagnosis not present

## 2021-10-11 DIAGNOSIS — H35033 Hypertensive retinopathy, bilateral: Secondary | ICD-10-CM | POA: Diagnosis not present

## 2021-10-11 DIAGNOSIS — E113312 Type 2 diabetes mellitus with moderate nonproliferative diabetic retinopathy with macular edema, left eye: Secondary | ICD-10-CM

## 2021-10-11 DIAGNOSIS — H35371 Puckering of macula, right eye: Secondary | ICD-10-CM

## 2021-10-26 DIAGNOSIS — K219 Gastro-esophageal reflux disease without esophagitis: Secondary | ICD-10-CM | POA: Diagnosis not present

## 2021-10-26 DIAGNOSIS — I480 Paroxysmal atrial fibrillation: Secondary | ICD-10-CM | POA: Diagnosis not present

## 2021-10-26 DIAGNOSIS — I129 Hypertensive chronic kidney disease with stage 1 through stage 4 chronic kidney disease, or unspecified chronic kidney disease: Secondary | ICD-10-CM | POA: Diagnosis not present

## 2021-10-26 DIAGNOSIS — D6869 Other thrombophilia: Secondary | ICD-10-CM | POA: Diagnosis not present

## 2021-10-26 DIAGNOSIS — N183 Chronic kidney disease, stage 3 unspecified: Secondary | ICD-10-CM | POA: Diagnosis not present

## 2021-10-26 DIAGNOSIS — I700 Atherosclerosis of aorta: Secondary | ICD-10-CM | POA: Diagnosis not present

## 2021-10-26 DIAGNOSIS — Z79899 Other long term (current) drug therapy: Secondary | ICD-10-CM | POA: Diagnosis not present

## 2021-10-26 DIAGNOSIS — D72829 Elevated white blood cell count, unspecified: Secondary | ICD-10-CM | POA: Diagnosis not present

## 2021-10-26 DIAGNOSIS — M8588 Other specified disorders of bone density and structure, other site: Secondary | ICD-10-CM | POA: Diagnosis not present

## 2021-10-26 DIAGNOSIS — E1121 Type 2 diabetes mellitus with diabetic nephropathy: Secondary | ICD-10-CM | POA: Diagnosis not present

## 2021-10-26 DIAGNOSIS — E785 Hyperlipidemia, unspecified: Secondary | ICD-10-CM | POA: Diagnosis not present

## 2021-10-26 DIAGNOSIS — G894 Chronic pain syndrome: Secondary | ICD-10-CM | POA: Diagnosis not present

## 2021-10-31 DIAGNOSIS — E1165 Type 2 diabetes mellitus with hyperglycemia: Secondary | ICD-10-CM | POA: Diagnosis not present

## 2021-10-31 DIAGNOSIS — Z794 Long term (current) use of insulin: Secondary | ICD-10-CM | POA: Diagnosis not present

## 2021-11-02 ENCOUNTER — Ambulatory Visit (INDEPENDENT_AMBULATORY_CARE_PROVIDER_SITE_OTHER): Payer: Medicare Other | Admitting: Podiatry

## 2021-11-02 ENCOUNTER — Encounter: Payer: Self-pay | Admitting: Podiatry

## 2021-11-02 DIAGNOSIS — M79675 Pain in left toe(s): Secondary | ICD-10-CM | POA: Diagnosis not present

## 2021-11-02 DIAGNOSIS — N189 Chronic kidney disease, unspecified: Secondary | ICD-10-CM | POA: Diagnosis not present

## 2021-11-02 DIAGNOSIS — E1165 Type 2 diabetes mellitus with hyperglycemia: Secondary | ICD-10-CM | POA: Diagnosis not present

## 2021-11-02 DIAGNOSIS — B351 Tinea unguium: Secondary | ICD-10-CM

## 2021-11-02 DIAGNOSIS — M79674 Pain in right toe(s): Secondary | ICD-10-CM

## 2021-11-02 DIAGNOSIS — E1122 Type 2 diabetes mellitus with diabetic chronic kidney disease: Secondary | ICD-10-CM | POA: Diagnosis not present

## 2021-11-02 DIAGNOSIS — Z7985 Long-term (current) use of injectable non-insulin antidiabetic drugs: Secondary | ICD-10-CM | POA: Diagnosis not present

## 2021-11-02 DIAGNOSIS — E1142 Type 2 diabetes mellitus with diabetic polyneuropathy: Secondary | ICD-10-CM

## 2021-11-02 DIAGNOSIS — E1140 Type 2 diabetes mellitus with diabetic neuropathy, unspecified: Secondary | ICD-10-CM | POA: Diagnosis not present

## 2021-11-02 DIAGNOSIS — Z794 Long term (current) use of insulin: Secondary | ICD-10-CM | POA: Diagnosis not present

## 2021-11-02 DIAGNOSIS — Z7984 Long term (current) use of oral hypoglycemic drugs: Secondary | ICD-10-CM | POA: Diagnosis not present

## 2021-11-02 NOTE — Progress Notes (Signed)
  Subjective:  Patient ID: Sabrina Baker, female    DOB: [birth year],  MRN: [id number]  Chief Complaint  Patient presents with   Nail Problem    Nail trim    81 y.o. female returns for the above complaint.  Patient presents with thickened elongated dystrophic toenails x10.  Pain on palpation.  Patient would like for me to debride down which is not able to do it herself.  She denies any other acute complaints.  Objective:  There were no vitals filed for this visit. Podiatric Exam: Vascular: dorsalis pedis and posterior tibial pulses are palpable bilateral. Capillary return is immediate. Temperature gradient is WNL. Skin turgor WNL  Sensorium: Normal Semmes Weinstein monofilament test. Normal tactile sensation bilaterally. Nail Exam: Pt has thick disfigured discolored nails with subungual debris noted bilateral entire nail hallux through fifth toenails.  Pain on palpation to the nails. Ulcer Exam: There is no evidence of ulcer or pre-ulcerative changes or infection. Orthopedic Exam: Muscle tone and strength are WNL. No limitations in general ROM. No crepitus or effusions noted. HAV  B/L.  Hammer toes 2-5  B/L. Skin: No Porokeratosis. No infection or ulcers    Assessment & Plan:   1. Diabetic peripheral neuropathy associated with type 2 diabetes mellitus (HCC)   2. Pain due to onychomycosis of toenails of both feet      Patient was evaluated and treated and all questions answered.  Hammertoes 2 through 5 bilateral -I explained to the patient the etiology of hammertoe contractures and various treatment options were discussed.  Given the amount of pain she is having I believe patient will benefit from diabetic shoes to prevent ulceration given the contractures.   -Diabetic shoes are functioning well.   Onychomycosis with pain  -Nails palliatively debrided as below. -Educated on self-care  Procedure: Nail Debridement Rationale: pain  Type of Debridement: manual, sharp  debridement. Instrumentation: Nail nipper, rotary burr. Number of Nails: 10  Procedures and Treatment: Consent by patient was obtained for treatment procedures. The patient understood the discussion of treatment and procedures well. All questions were answered thoroughly reviewed. Debridement of mycotic and hypertrophic toenails, 1 through 5 bilateral and clearing of subungual debris. No ulceration, no infection noted.  Return Visit-Office Procedure: Patient instructed to return to the office for a follow up visit 3 months for continued evaluation and treatment.  Boneta Lucks, DPM    No follow-ups on file.

## 2021-11-08 ENCOUNTER — Encounter (INDEPENDENT_AMBULATORY_CARE_PROVIDER_SITE_OTHER): Payer: Medicare Other | Admitting: Ophthalmology

## 2021-11-08 DIAGNOSIS — E113312 Type 2 diabetes mellitus with moderate nonproliferative diabetic retinopathy with macular edema, left eye: Secondary | ICD-10-CM | POA: Diagnosis not present

## 2021-11-08 DIAGNOSIS — I10 Essential (primary) hypertension: Secondary | ICD-10-CM

## 2021-11-08 DIAGNOSIS — H35372 Puckering of macula, left eye: Secondary | ICD-10-CM

## 2021-11-08 DIAGNOSIS — H338 Other retinal detachments: Secondary | ICD-10-CM

## 2021-11-08 DIAGNOSIS — E113591 Type 2 diabetes mellitus with proliferative diabetic retinopathy without macular edema, right eye: Secondary | ICD-10-CM | POA: Diagnosis not present

## 2021-11-08 DIAGNOSIS — H43812 Vitreous degeneration, left eye: Secondary | ICD-10-CM

## 2021-11-08 DIAGNOSIS — H35033 Hypertensive retinopathy, bilateral: Secondary | ICD-10-CM

## 2021-11-08 DIAGNOSIS — N1832 Chronic kidney disease, stage 3b: Secondary | ICD-10-CM | POA: Diagnosis not present

## 2021-11-15 DIAGNOSIS — D631 Anemia in chronic kidney disease: Secondary | ICD-10-CM | POA: Diagnosis not present

## 2021-11-15 DIAGNOSIS — M1A9XX1 Chronic gout, unspecified, with tophus (tophi): Secondary | ICD-10-CM | POA: Diagnosis not present

## 2021-11-15 DIAGNOSIS — E1122 Type 2 diabetes mellitus with diabetic chronic kidney disease: Secondary | ICD-10-CM | POA: Diagnosis not present

## 2021-11-15 DIAGNOSIS — N1832 Chronic kidney disease, stage 3b: Secondary | ICD-10-CM | POA: Diagnosis not present

## 2021-11-15 DIAGNOSIS — I129 Hypertensive chronic kidney disease with stage 1 through stage 4 chronic kidney disease, or unspecified chronic kidney disease: Secondary | ICD-10-CM | POA: Diagnosis not present

## 2021-11-15 DIAGNOSIS — N2581 Secondary hyperparathyroidism of renal origin: Secondary | ICD-10-CM | POA: Diagnosis not present

## 2021-11-24 DIAGNOSIS — M48062 Spinal stenosis, lumbar region with neurogenic claudication: Secondary | ICD-10-CM | POA: Diagnosis not present

## 2021-11-24 DIAGNOSIS — M5412 Radiculopathy, cervical region: Secondary | ICD-10-CM | POA: Diagnosis not present

## 2021-11-24 DIAGNOSIS — M4316 Spondylolisthesis, lumbar region: Secondary | ICD-10-CM | POA: Diagnosis not present

## 2021-12-02 ENCOUNTER — Telehealth: Payer: Self-pay | Admitting: Internal Medicine

## 2021-12-02 ENCOUNTER — Encounter: Payer: Self-pay | Admitting: Physical Medicine and Rehabilitation

## 2021-12-02 NOTE — Telephone Encounter (Signed)
Patient called the office stating she has been having diarrhea since starting Allopurinol. Patient would like to know if it could be a side effect.

## 2021-12-06 ENCOUNTER — Encounter (INDEPENDENT_AMBULATORY_CARE_PROVIDER_SITE_OTHER): Payer: Medicare Other | Admitting: Ophthalmology

## 2021-12-06 DIAGNOSIS — E113591 Type 2 diabetes mellitus with proliferative diabetic retinopathy without macular edema, right eye: Secondary | ICD-10-CM | POA: Diagnosis not present

## 2021-12-06 DIAGNOSIS — E113312 Type 2 diabetes mellitus with moderate nonproliferative diabetic retinopathy with macular edema, left eye: Secondary | ICD-10-CM

## 2021-12-06 DIAGNOSIS — H43812 Vitreous degeneration, left eye: Secondary | ICD-10-CM | POA: Diagnosis not present

## 2021-12-06 DIAGNOSIS — I10 Essential (primary) hypertension: Secondary | ICD-10-CM

## 2021-12-06 DIAGNOSIS — H338 Other retinal detachments: Secondary | ICD-10-CM | POA: Diagnosis not present

## 2021-12-06 DIAGNOSIS — H35033 Hypertensive retinopathy, bilateral: Secondary | ICD-10-CM | POA: Diagnosis not present

## 2021-12-16 ENCOUNTER — Telehealth: Payer: Self-pay | Admitting: Internal Medicine

## 2021-12-16 DIAGNOSIS — M1A9XX1 Chronic gout, unspecified, with tophus (tophi): Secondary | ICD-10-CM

## 2021-12-16 NOTE — Telephone Encounter (Signed)
Patient called the office stating she would like someone to call her back regarding her allopurinol. Patient states its causing diarrhea and she needs to know what to do about it.

## 2021-12-16 NOTE — Telephone Encounter (Unsigned)
Patient called stating she stopped taking the Allopurinol 1 week ago and the diarrhea stopped.  This morning around 6:30 am she took it and within an hour started having diarrhea.  Patient requested a return call to let her know how to proceed.

## 2021-12-20 NOTE — Telephone Encounter (Signed)
Sorry to hear she was having side effects from the medication. If it's significant she should probably stay off the allopurinol.  If she stops this I recommend rechecking her uric acid 1 month after she stops. If the uric acid increases significantly we could switch to an alternative medication. If it stays okay we could just monitor for now.

## 2021-12-20 NOTE — Telephone Encounter (Signed)
Patient advised sorry to hear she was having side effects from the medication. Patient advised if it's significant she should probably stay off the allopurinol.   Patient advised if she stops this, Dr. Benjamine Mola recommends rechecking her uric acid 1 month after she stops. If the uric acid increases significantly we could switch to an alternative medication. If it stays okay we could just monitor for now. Patient expressed understanding and will come in August to have her Uric Acid checked. Future order placed.

## 2021-12-27 ENCOUNTER — Emergency Department (HOSPITAL_COMMUNITY)
Admission: EM | Admit: 2021-12-27 | Discharge: 2021-12-27 | Discharge status: Against Medical Advice | Payer: Medicare Other | Attending: Emergency Medicine | Admitting: Emergency Medicine

## 2021-12-27 ENCOUNTER — Encounter (HOSPITAL_COMMUNITY): Payer: Self-pay | Admitting: Emergency Medicine

## 2021-12-27 ENCOUNTER — Other Ambulatory Visit: Payer: Self-pay

## 2021-12-27 DIAGNOSIS — M545 Low back pain, unspecified: Secondary | ICD-10-CM | POA: Diagnosis not present

## 2021-12-27 DIAGNOSIS — M542 Cervicalgia: Secondary | ICD-10-CM | POA: Diagnosis not present

## 2021-12-27 DIAGNOSIS — R0789 Other chest pain: Secondary | ICD-10-CM | POA: Diagnosis not present

## 2021-12-27 DIAGNOSIS — R634 Abnormal weight loss: Secondary | ICD-10-CM | POA: Insufficient documentation

## 2021-12-27 DIAGNOSIS — M549 Dorsalgia, unspecified: Secondary | ICD-10-CM | POA: Insufficient documentation

## 2021-12-27 DIAGNOSIS — M5441 Lumbago with sciatica, right side: Secondary | ICD-10-CM | POA: Diagnosis not present

## 2021-12-27 DIAGNOSIS — R519 Headache, unspecified: Secondary | ICD-10-CM | POA: Diagnosis not present

## 2021-12-27 DIAGNOSIS — G8929 Other chronic pain: Secondary | ICD-10-CM | POA: Diagnosis not present

## 2021-12-27 DIAGNOSIS — Z5321 Procedure and treatment not carried out due to patient leaving prior to being seen by health care provider: Secondary | ICD-10-CM | POA: Diagnosis not present

## 2021-12-27 DIAGNOSIS — M5442 Lumbago with sciatica, left side: Secondary | ICD-10-CM | POA: Diagnosis not present

## 2021-12-27 NOTE — ED Triage Notes (Addendum)
C/o headache and "grinding noises" in the back of her head when she turns her head x 2-3 weeks.  Also reports bilateral leg pain/weakness and lower back pain.  Last fall in March.  Reports 40lbs unintentional weight loss in the last year.  Seen by PCP office this morning .

## 2021-12-27 NOTE — ED Notes (Signed)
No answer x2 

## 2021-12-27 NOTE — ED Provider Triage Note (Signed)
Emergency Medicine Provider Triage Evaluation Note  Sabrina Baker , a 81 y.o. female  was evaluated in triage.  Pt complains of mutliple complaints including headache, neck pain, weight loss, back pain.  She has a history of RA and chronic pain.  She was seen by PA Phillips Odor "for an MRI."  Review of Systems  Positive: Multiple complaints Negative: fever  Physical Exam  BP 137/78 (BP Location: Right Arm)   Pulse 96   Temp 98.5 F (36.9 C) (Oral)   Resp 16   SpO2 98%  Gen:   Awake, no distress   Resp:  Normal effort  MSK:   Moves extremities without difficulty  Other:    Medical Decision Making  Medically screening exam initiated at 10:59 AM.  Appropriate orders placed.  Sabrina Baker was informed that the remainder of the evaluation will be completed by another provider, this initial triage assessment does not replace that evaluation, and the importance of remaining in the ED until their evaluation is complete.  I spoke with PA Vinny Costella over the phone.  He states that this patient has been seen in the past by Dr. Ronnald Ramp told she did not need an MRI because she is not a surgical candidate.  She came to his office demanding an MRI today.  He said that he could not get one immediately and did not know what to tell her. Patient came to the ER stating that she was told to come for an MRI however this is not factual.   Margarita Mail, PA-C 12/27/21 1108

## 2021-12-27 NOTE — ED Notes (Signed)
No answer x1

## 2021-12-27 NOTE — ED Notes (Signed)
No answer x3

## 2021-12-28 ENCOUNTER — Other Ambulatory Visit: Payer: Self-pay | Admitting: Family Medicine

## 2021-12-28 DIAGNOSIS — M5442 Lumbago with sciatica, left side: Secondary | ICD-10-CM

## 2021-12-29 ENCOUNTER — Other Ambulatory Visit: Payer: Self-pay | Admitting: Family Medicine

## 2021-12-29 DIAGNOSIS — G8929 Other chronic pain: Secondary | ICD-10-CM

## 2021-12-30 ENCOUNTER — Other Ambulatory Visit: Payer: Self-pay | Admitting: Physician Assistant

## 2021-12-30 DIAGNOSIS — R519 Headache, unspecified: Secondary | ICD-10-CM

## 2022-01-03 ENCOUNTER — Telehealth: Payer: Self-pay | Admitting: Internal Medicine

## 2022-01-03 ENCOUNTER — Encounter (INDEPENDENT_AMBULATORY_CARE_PROVIDER_SITE_OTHER): Payer: Medicare Other | Admitting: Ophthalmology

## 2022-01-03 DIAGNOSIS — I10 Essential (primary) hypertension: Secondary | ICD-10-CM

## 2022-01-03 DIAGNOSIS — H35033 Hypertensive retinopathy, bilateral: Secondary | ICD-10-CM | POA: Diagnosis not present

## 2022-01-03 DIAGNOSIS — E113591 Type 2 diabetes mellitus with proliferative diabetic retinopathy without macular edema, right eye: Secondary | ICD-10-CM | POA: Diagnosis not present

## 2022-01-03 DIAGNOSIS — H338 Other retinal detachments: Secondary | ICD-10-CM

## 2022-01-03 DIAGNOSIS — H43813 Vitreous degeneration, bilateral: Secondary | ICD-10-CM

## 2022-01-03 DIAGNOSIS — E113312 Type 2 diabetes mellitus with moderate nonproliferative diabetic retinopathy with macular edema, left eye: Secondary | ICD-10-CM | POA: Diagnosis not present

## 2022-01-03 NOTE — Telephone Encounter (Signed)
Patient advised she is not due for labs until then middle of August. Patient advised Dr. Benjamine Mola wanted to check her uric acid level one month after being off the Allopurinol to see what effect it has on her uric acid. Patient expressed understanding and will come in August to have labs.

## 2022-01-03 NOTE — Telephone Encounter (Signed)
Patient called the office requesting lab orders be sent to [facility], Dr. Dema Severin. Patient states she is going tomorrow at 55. Patient requests call once faxed. Fax# [phone]

## 2022-01-04 DIAGNOSIS — R634 Abnormal weight loss: Secondary | ICD-10-CM | POA: Diagnosis not present

## 2022-01-06 ENCOUNTER — Other Ambulatory Visit: Payer: Medicare Other

## 2022-01-07 ENCOUNTER — Ambulatory Visit
Admission: RE | Admit: 2022-01-07 | Discharge: 2022-01-07 | Disposition: A | Discharge status: Home / Self Care | Payer: Medicare Other | Source: Ambulatory Visit | Attending: Physician Assistant | Admitting: Physician Assistant

## 2022-01-07 ENCOUNTER — Ambulatory Visit
Admission: RE | Admit: 2022-01-07 | Discharge: 2022-01-07 | Disposition: A | Discharge status: Home / Self Care | Payer: Medicare Other | Source: Ambulatory Visit | Attending: Family Medicine | Admitting: Family Medicine

## 2022-01-07 DIAGNOSIS — R519 Headache, unspecified: Secondary | ICD-10-CM | POA: Diagnosis not present

## 2022-01-07 DIAGNOSIS — G8929 Other chronic pain: Secondary | ICD-10-CM

## 2022-01-07 DIAGNOSIS — M48061 Spinal stenosis, lumbar region without neurogenic claudication: Secondary | ICD-10-CM | POA: Diagnosis not present

## 2022-01-07 DIAGNOSIS — M5136 Other intervertebral disc degeneration, lumbar region: Secondary | ICD-10-CM | POA: Diagnosis not present

## 2022-01-07 DIAGNOSIS — M545 Low back pain, unspecified: Secondary | ICD-10-CM | POA: Diagnosis not present

## 2022-01-18 ENCOUNTER — Other Ambulatory Visit: Payer: Self-pay | Admitting: Family Medicine

## 2022-01-18 DIAGNOSIS — Z1231 Encounter for screening mammogram for malignant neoplasm of breast: Secondary | ICD-10-CM

## 2022-01-23 ENCOUNTER — Other Ambulatory Visit: Payer: Self-pay | Admitting: Physical Medicine and Rehabilitation

## 2022-01-23 ENCOUNTER — Ambulatory Visit
Admission: RE | Admit: 2022-01-23 | Discharge: 2022-01-23 | Disposition: A | Discharge status: Home / Self Care | Payer: Medicare Other | Source: Ambulatory Visit | Attending: Physical Medicine and Rehabilitation | Admitting: Physical Medicine and Rehabilitation

## 2022-01-23 DIAGNOSIS — M164 Bilateral post-traumatic osteoarthritis of hip: Secondary | ICD-10-CM | POA: Diagnosis not present

## 2022-01-23 DIAGNOSIS — M461 Sacroiliitis, not elsewhere classified: Secondary | ICD-10-CM | POA: Diagnosis not present

## 2022-01-23 DIAGNOSIS — M5416 Radiculopathy, lumbar region: Secondary | ICD-10-CM | POA: Diagnosis not present

## 2022-01-23 DIAGNOSIS — M5126 Other intervertebral disc displacement, lumbar region: Secondary | ICD-10-CM | POA: Diagnosis not present

## 2022-01-31 ENCOUNTER — Encounter (INDEPENDENT_AMBULATORY_CARE_PROVIDER_SITE_OTHER): Payer: Medicare Other | Admitting: Ophthalmology

## 2022-01-31 DIAGNOSIS — I10 Essential (primary) hypertension: Secondary | ICD-10-CM

## 2022-01-31 DIAGNOSIS — H43812 Vitreous degeneration, left eye: Secondary | ICD-10-CM | POA: Diagnosis not present

## 2022-01-31 DIAGNOSIS — H35033 Hypertensive retinopathy, bilateral: Secondary | ICD-10-CM

## 2022-01-31 DIAGNOSIS — H35371 Puckering of macula, right eye: Secondary | ICD-10-CM | POA: Diagnosis not present

## 2022-01-31 DIAGNOSIS — H338 Other retinal detachments: Secondary | ICD-10-CM | POA: Diagnosis not present

## 2022-01-31 DIAGNOSIS — E113312 Type 2 diabetes mellitus with moderate nonproliferative diabetic retinopathy with macular edema, left eye: Secondary | ICD-10-CM | POA: Diagnosis not present

## 2022-01-31 DIAGNOSIS — E113511 Type 2 diabetes mellitus with proliferative diabetic retinopathy with macular edema, right eye: Secondary | ICD-10-CM | POA: Diagnosis not present

## 2022-02-06 DIAGNOSIS — M461 Sacroiliitis, not elsewhere classified: Secondary | ICD-10-CM | POA: Diagnosis not present

## 2022-02-07 DIAGNOSIS — D320 Benign neoplasm of cerebral meninges: Secondary | ICD-10-CM | POA: Diagnosis not present

## 2022-02-07 DIAGNOSIS — M4316 Spondylolisthesis, lumbar region: Secondary | ICD-10-CM | POA: Diagnosis not present

## 2022-02-08 ENCOUNTER — Ambulatory Visit (INDEPENDENT_AMBULATORY_CARE_PROVIDER_SITE_OTHER): Payer: Medicare Other | Admitting: Podiatry

## 2022-02-08 DIAGNOSIS — B351 Tinea unguium: Secondary | ICD-10-CM

## 2022-02-08 DIAGNOSIS — E1142 Type 2 diabetes mellitus with diabetic polyneuropathy: Secondary | ICD-10-CM

## 2022-02-08 DIAGNOSIS — M79674 Pain in right toe(s): Secondary | ICD-10-CM | POA: Diagnosis not present

## 2022-02-08 DIAGNOSIS — M79675 Pain in left toe(s): Secondary | ICD-10-CM | POA: Diagnosis not present

## 2022-02-08 NOTE — Progress Notes (Signed)
  Subjective:  Patient ID: Sabrina Baker, female    DOB: 09/17/1940,  MRN: [id number]  Chief Complaint  Patient presents with   Nail Problem   80 y.o. female returns for the above complaint.  Patient presents with thickened elongated dystrophic toenails x10.  Pain on palpation.  Patient would like for me to debride down which is not able to do it herself.  She denies any other acute complaints.  Objective:  There were no vitals filed for this visit. Podiatric Exam: Vascular: dorsalis pedis and posterior tibial pulses are palpable bilateral. Capillary return is immediate. Temperature gradient is WNL. Skin turgor WNL  Sensorium: Normal Semmes Weinstein monofilament test. Normal tactile sensation bilaterally. Nail Exam: Pt has thick disfigured discolored nails with subungual debris noted bilateral entire nail hallux through fifth toenails.  Pain on palpation to the nails. Ulcer Exam: There is no evidence of ulcer or pre-ulcerative changes or infection. Orthopedic Exam: Muscle tone and strength are WNL. No limitations in general ROM. No crepitus or effusions noted. HAV  B/L.  Hammer toes 2-5  B/L. Skin: No Porokeratosis. No infection or ulcers    Assessment & Plan:   No diagnosis found.    Patient was evaluated and treated and all questions answered.  Hammertoes 2 through 5 bilateral -I explained to the patient the etiology of hammertoe contractures and various treatment options were discussed.  Given the amount of pain she is having I believe patient will benefit from diabetic shoes to prevent ulceration given the contractures.   -Diabetic shoes are functioning well.   Onychomycosis with pain  -Nails palliatively debrided as below. -Educated on self-care  Procedure: Nail Debridement Rationale: pain  Type of Debridement: manual, sharp debridement. Instrumentation: Nail nipper, rotary burr. Number of Nails: 10  Procedures and Treatment: Consent by patient was obtained for  treatment procedures. The patient understood the discussion of treatment and procedures well. All questions were answered thoroughly reviewed. Debridement of mycotic and hypertrophic toenails, 1 through 5 bilateral and clearing of subungual debris. No ulceration, no infection noted.  Return Visit-Office Procedure: Patient instructed to return to the office for a follow up visit 3 months for continued evaluation and treatment.  Ahmeer Tuman, DPM    No follow-ups on file. 

## 2022-02-10 DIAGNOSIS — N183 Chronic kidney disease, stage 3 unspecified: Secondary | ICD-10-CM | POA: Diagnosis not present

## 2022-02-10 DIAGNOSIS — I480 Paroxysmal atrial fibrillation: Secondary | ICD-10-CM | POA: Diagnosis not present

## 2022-02-10 DIAGNOSIS — I129 Hypertensive chronic kidney disease with stage 1 through stage 4 chronic kidney disease, or unspecified chronic kidney disease: Secondary | ICD-10-CM | POA: Diagnosis not present

## 2022-02-10 DIAGNOSIS — I5033 Acute on chronic diastolic (congestive) heart failure: Secondary | ICD-10-CM | POA: Diagnosis not present

## 2022-02-10 DIAGNOSIS — Z23 Encounter for immunization: Secondary | ICD-10-CM | POA: Diagnosis not present

## 2022-02-10 DIAGNOSIS — D6869 Other thrombophilia: Secondary | ICD-10-CM | POA: Diagnosis not present

## 2022-02-10 DIAGNOSIS — E441 Mild protein-calorie malnutrition: Secondary | ICD-10-CM | POA: Diagnosis not present

## 2022-02-10 DIAGNOSIS — Z0000 Encounter for general adult medical examination without abnormal findings: Secondary | ICD-10-CM | POA: Diagnosis not present

## 2022-02-10 DIAGNOSIS — E785 Hyperlipidemia, unspecified: Secondary | ICD-10-CM | POA: Diagnosis not present

## 2022-02-10 DIAGNOSIS — R634 Abnormal weight loss: Secondary | ICD-10-CM | POA: Diagnosis not present

## 2022-02-10 DIAGNOSIS — K219 Gastro-esophageal reflux disease without esophagitis: Secondary | ICD-10-CM | POA: Diagnosis not present

## 2022-02-10 DIAGNOSIS — I700 Atherosclerosis of aorta: Secondary | ICD-10-CM | POA: Diagnosis not present

## 2022-02-10 DIAGNOSIS — E1121 Type 2 diabetes mellitus with diabetic nephropathy: Secondary | ICD-10-CM | POA: Diagnosis not present

## 2022-02-10 DIAGNOSIS — E538 Deficiency of other specified B group vitamins: Secondary | ICD-10-CM | POA: Diagnosis not present

## 2022-02-10 DIAGNOSIS — G894 Chronic pain syndrome: Secondary | ICD-10-CM | POA: Diagnosis not present

## 2022-02-15 DIAGNOSIS — Z961 Presence of intraocular lens: Secondary | ICD-10-CM | POA: Diagnosis not present

## 2022-02-15 DIAGNOSIS — H1711 Central corneal opacity, right eye: Secondary | ICD-10-CM | POA: Diagnosis not present

## 2022-02-15 DIAGNOSIS — H40022 Open angle with borderline findings, high risk, left eye: Secondary | ICD-10-CM | POA: Diagnosis not present

## 2022-02-15 DIAGNOSIS — H401113 Primary open-angle glaucoma, right eye, severe stage: Secondary | ICD-10-CM | POA: Diagnosis not present

## 2022-02-15 DIAGNOSIS — H16231 Neurotrophic keratoconjunctivitis, right eye: Secondary | ICD-10-CM | POA: Diagnosis not present

## 2022-02-15 DIAGNOSIS — H0011 Chalazion right upper eyelid: Secondary | ICD-10-CM | POA: Diagnosis not present

## 2022-02-28 ENCOUNTER — Encounter (INDEPENDENT_AMBULATORY_CARE_PROVIDER_SITE_OTHER): Payer: Medicare Other | Admitting: Ophthalmology

## 2022-02-28 DIAGNOSIS — H43812 Vitreous degeneration, left eye: Secondary | ICD-10-CM | POA: Diagnosis not present

## 2022-02-28 DIAGNOSIS — E113511 Type 2 diabetes mellitus with proliferative diabetic retinopathy with macular edema, right eye: Secondary | ICD-10-CM | POA: Diagnosis not present

## 2022-02-28 DIAGNOSIS — I10 Essential (primary) hypertension: Secondary | ICD-10-CM | POA: Diagnosis not present

## 2022-02-28 DIAGNOSIS — H35033 Hypertensive retinopathy, bilateral: Secondary | ICD-10-CM | POA: Diagnosis not present

## 2022-02-28 DIAGNOSIS — H338 Other retinal detachments: Secondary | ICD-10-CM | POA: Diagnosis not present

## 2022-02-28 DIAGNOSIS — E113312 Type 2 diabetes mellitus with moderate nonproliferative diabetic retinopathy with macular edema, left eye: Secondary | ICD-10-CM

## 2022-03-09 DIAGNOSIS — E1165 Type 2 diabetes mellitus with hyperglycemia: Secondary | ICD-10-CM | POA: Diagnosis not present

## 2022-03-09 DIAGNOSIS — Z794 Long term (current) use of insulin: Secondary | ICD-10-CM | POA: Diagnosis not present

## 2022-03-15 DIAGNOSIS — Z7985 Long-term (current) use of injectable non-insulin antidiabetic drugs: Secondary | ICD-10-CM | POA: Diagnosis not present

## 2022-03-15 DIAGNOSIS — E1140 Type 2 diabetes mellitus with diabetic neuropathy, unspecified: Secondary | ICD-10-CM | POA: Diagnosis not present

## 2022-03-15 DIAGNOSIS — Z794 Long term (current) use of insulin: Secondary | ICD-10-CM | POA: Diagnosis not present

## 2022-03-15 DIAGNOSIS — E1165 Type 2 diabetes mellitus with hyperglycemia: Secondary | ICD-10-CM | POA: Diagnosis not present

## 2022-03-15 DIAGNOSIS — Z7984 Long term (current) use of oral hypoglycemic drugs: Secondary | ICD-10-CM | POA: Diagnosis not present

## 2022-03-22 DIAGNOSIS — N2581 Secondary hyperparathyroidism of renal origin: Secondary | ICD-10-CM | POA: Diagnosis not present

## 2022-03-22 DIAGNOSIS — N1832 Chronic kidney disease, stage 3b: Secondary | ICD-10-CM | POA: Diagnosis not present

## 2022-03-22 DIAGNOSIS — N189 Chronic kidney disease, unspecified: Secondary | ICD-10-CM | POA: Diagnosis not present

## 2022-03-22 DIAGNOSIS — D631 Anemia in chronic kidney disease: Secondary | ICD-10-CM | POA: Diagnosis not present

## 2022-03-22 DIAGNOSIS — E1122 Type 2 diabetes mellitus with diabetic chronic kidney disease: Secondary | ICD-10-CM | POA: Diagnosis not present

## 2022-03-22 DIAGNOSIS — I10 Essential (primary) hypertension: Secondary | ICD-10-CM | POA: Diagnosis not present

## 2022-03-28 ENCOUNTER — Encounter (INDEPENDENT_AMBULATORY_CARE_PROVIDER_SITE_OTHER): Payer: Medicare Other | Admitting: Ophthalmology

## 2022-03-28 DIAGNOSIS — H43812 Vitreous degeneration, left eye: Secondary | ICD-10-CM | POA: Diagnosis not present

## 2022-03-28 DIAGNOSIS — H35033 Hypertensive retinopathy, bilateral: Secondary | ICD-10-CM | POA: Diagnosis not present

## 2022-03-28 DIAGNOSIS — E113312 Type 2 diabetes mellitus with moderate nonproliferative diabetic retinopathy with macular edema, left eye: Secondary | ICD-10-CM

## 2022-03-28 DIAGNOSIS — I10 Essential (primary) hypertension: Secondary | ICD-10-CM | POA: Diagnosis not present

## 2022-03-28 DIAGNOSIS — E113511 Type 2 diabetes mellitus with proliferative diabetic retinopathy with macular edema, right eye: Secondary | ICD-10-CM

## 2022-03-28 DIAGNOSIS — H353122 Nonexudative age-related macular degeneration, left eye, intermediate dry stage: Secondary | ICD-10-CM

## 2022-03-28 DIAGNOSIS — H35371 Puckering of macula, right eye: Secondary | ICD-10-CM

## 2022-03-31 DIAGNOSIS — N1832 Chronic kidney disease, stage 3b: Secondary | ICD-10-CM | POA: Diagnosis not present

## 2022-04-05 ENCOUNTER — Encounter
Payer: Medicare Other | Attending: Physical Medicine and Rehabilitation | Admitting: Physical Medicine and Rehabilitation

## 2022-04-05 ENCOUNTER — Encounter: Payer: Self-pay | Admitting: Physical Medicine and Rehabilitation

## 2022-04-05 VITALS — BP 148/89 | HR 75 | Ht 63.0 in | Wt 132.0 lb

## 2022-04-05 DIAGNOSIS — M48062 Spinal stenosis, lumbar region with neurogenic claudication: Secondary | ICD-10-CM | POA: Diagnosis not present

## 2022-04-05 DIAGNOSIS — Z79891 Long term (current) use of opiate analgesic: Secondary | ICD-10-CM | POA: Diagnosis not present

## 2022-04-05 DIAGNOSIS — R269 Unspecified abnormalities of gait and mobility: Secondary | ICD-10-CM | POA: Diagnosis not present

## 2022-04-05 DIAGNOSIS — Z79899 Other long term (current) drug therapy: Secondary | ICD-10-CM | POA: Diagnosis not present

## 2022-04-05 DIAGNOSIS — G894 Chronic pain syndrome: Secondary | ICD-10-CM | POA: Insufficient documentation

## 2022-04-05 DIAGNOSIS — Z5181 Encounter for therapeutic drug level monitoring: Secondary | ICD-10-CM | POA: Insufficient documentation

## 2022-04-05 DIAGNOSIS — M4800 Spinal stenosis, site unspecified: Secondary | ICD-10-CM | POA: Insufficient documentation

## 2022-04-05 MED ORDER — GABAPENTIN 300 MG PO CAPS: 300.0000 mg | ORAL_CAPSULE | Freq: Two times a day (BID) | ORAL | 5 refills | Status: DC

## 2022-04-05 NOTE — Progress Notes (Signed)
Subjective:    Patient ID: Sabrina Baker, female    DOB: Oct 24, 1940, 81 y.o.   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  Hx of Charcot feet B/L Here for evaluation for chronic pain.   Also had COVID 3/30 to 5/1 - was in [facility]- sedated, intubated x2; UTI, and went to [facility] 5/1-5/21- then went to [facility]- in [place] til 11/15/2018   Has chronic pain and Lumbar stenosis with lumbar radiculopathy-    April 2023- chair slipped and ended up on the floor- and hurt more on the R where she landed- so more severe pain on R>L  lower lumbar  Radiates down legs- as far as the calves and sometimes down as far as feet- B/L   Also has diabetic neuropathy and getting worse as well.    Has a little brace on below knee on LLE- - is compression brace- seems to help pain in LE's. Mainly wears on LLE.    Tried: Takes Norco 10/325 mg usually gets #45- or 5/325 mg #90- Usually takes 3x/day Not on any nerve pain meds- uses pain cream, voltaren gel and salonpas for pain- not at the same time Usually puts on low back Gabapentin 200 mg QHS- makes her sleep  On Duloxetine 60 mg daily   Has rolator at home- is worn out- c Can walk a little bit with cane.  Here in transport w/c.     Surgeries: Hx of R shoulder fx 2006 and L wrist-  2007- L heel surgery due to popped achilles 2006- detached retina- surgery 2017- glaucoma surgery for pressure 2015-  L ankle fx- and plate- Dr Erlinda Hong 80s/90's cataract surgeries.    MRI- some severe spondylosis throughout cervical spine- subluxation T1-2 and T2-3- with some stenosis.  Lumbar MRI- severe Advanced DDD and advanced facet arthropathy- throughout lumbar spine- severe spinal stenosis L1/2 and L4/5-  and moderately severe spinal stenosis at T10/11 as  well.    Pain Inventory Average Pain 6 Pain Right Now 4 My pain is intermittent, sharp, burning, dull, stabbing, tingling, and aching  In the last 24 hours, has pain interfered with the following? General activity 5 Relation with others 3 Enjoyment of life 5 What TIME of day is your pain at its worst? varies Sleep (in general) Good  Pain is worse with: walking, bending, sitting, and standing Pain improves with: rest and medication Relief from Meds: 8  use a cane use a walker how many minutes can you walk? 5 ability to climb steps?  no do you drive?  no  disabled: date disabled . I need assistance with the following:  meal prep, household duties, and shopping  bladder control problems weakness numbness tremor tingling trouble walking spasms depression  New pt  New pt    No family history on file. Social History   Socioeconomic History   Marital status: Divorced    Spouse name: Not on file   Number of children: Not on file   Years of education: Not on file   Highest education level: Not on file  Occupational History   Not on file  Tobacco Use   Smoking status: Never   Smokeless tobacco: Never  Vaping Use   Vaping Use: Never used  Substance and Sexual  Activity   Alcohol use: No   Drug use: No   Sexual activity: Never    Birth control/protection: Post-menopausal  Other Topics Concern   Not on file  Social History Narrative   Not on file   Social Determinants of Health   Financial Resource Strain: Not on file  Food Insecurity: Not on file  Transportation Needs: Not on file  Physical Activity: Not on file  Stress: Not on file  Social Connections: Not on file   Past Surgical History:  Procedure Laterality Date   25 GAUGE PARS PLANA VITRECTOMY WITH 20 GAUGE MVR PORT Right 03/16/2015   Procedure: 25 GAUGE PARS PLANA VITRECTOMY WITH 23 GAUGE MVR PORT;  Surgeon: Hayden Pedro, MD;  Location: [place];  Service: Ophthalmology;  Laterality: Right;    ANKLE SURGERY     BREAST EXCISIONAL BIOPSY     BREAST LUMPECTOMY     BREAST LUMPECTOMY WITH RADIOACTIVE SEED LOCALIZATION Right 01/24/2016   Procedure: RIGHT BREAST LUMPECTOMY WITH RADIOACTIVE SEED LOCALIZATION;  Surgeon: Coralie Keens, MD;  Location: [place];  Service: General;  Laterality: Right;   EYE SURGERY     detached retna X2   HEEL SPUR SURGERY  2012   INCISION AND DRAINAGE ABSCESS Right 11/21/2019   Procedure: INCISION AND DRAINAGE FOREHEAD & RIGHT EAR ABSCESS;  Surgeon: Jerrell Belfast, MD;  Location: [place];  Service: ENT;  Laterality: Right;   INJECTION OF SILICONE OIL Right 19/62/2297   Procedure: Extraction OF SILICONE OIL;  Surgeon: Hayden Pedro, MD;  Location: [place];  Service: Ophthalmology;  Laterality: Right;   ORIF ANKLE FRACTURE Left 06/17/2014   Procedure: OPEN REDUCTION INTERNAL FIXATION (ORIF) LEFT BIMALLEOLAR ANKLE FRACTURE;  Surgeon: Marianna Payment, MD;  Location: [place];  Service: Orthopedics;  Laterality: Left;   PHOTOCOAGULATION WITH LASER Right 03/16/2015   Procedure: PHOTOCOAGULATION WITH LASER;  Surgeon: Hayden Pedro, MD;  Location: [place];  Service: Ophthalmology;  Laterality: Right;   SHOULDER ARTHROSCOPY Right    plates and screws   VITRECTOMY Right 03/16/2015   WRIST ARTHROPLASTY Left    plates and screws and bone graft   Past Medical History:  Diagnosis Date   Acute diastolic congestive heart failure (HCC)    Carotid stenosis    Carotid US 1/22: Bilateral ICA 1-39; follow-up as needed   Chronic kidney disease    stage 3   Diabetes mellitus without complication (HCC)    Gout    Hypercholesteremia    Hypertension    Neuromuscular disorder (HCC)    neuropathy   Osteopenia    Retinal detachment    TIA (transient ischemic attack)    Vitamin B12 deficiency    BP (!) 148/89   Pulse 75   Ht '5\' 3"'$  (1.6 m)   Wt 130 lb (59 kg)   BMI 23.03 kg/m   Opioid Risk Score:   Fall Risk Score:  `1  Depression screen [facility]  2/9     04/05/2022   11:06 AM 12/01/2019    1:51 PM  Depression screen PHQ 2/9  Decreased Interest 1 0  Down, Depressed, Hopeless 1 0  PHQ - 2 Score 2 0  Altered sleeping 1   Tired, decreased energy 1   Change in appetite 0   Feeling bad or failure about yourself  0   Trouble concentrating 0   Moving slowly or fidgety/restless 0   Suicidal thoughts 0   PHQ-9 Score 4   Difficult doing work/chores  Somewhat difficult      Review of Systems  Musculoskeletal:  Positive for back pain and gait problem.       Bilateral pain on the anterior part of leg Bilateral calf, knee, foot pain  Neurological:  Positive for weakness and numbness.  Psychiatric/Behavioral:  Positive for dysphoric mood.   All other systems reviewed and are negative.     Objective:   Physical Exam Awake, alert, in transport w/c; very talkative, NAD Appears frail; and stated age  MS: RLE- HF 5-/5; KE/KF 4+5 DF 4-/5 and PF 4+/5 LLE- HF 5-/5; KE/KF 4+/5; and DF 3+/5 (ankle fusion) and PF 4/5  TTP across low back - R>L L4/5 paraspinals as well as SI joints are also TTP- compression testing (+) Has (+) SLR R side, not L side Pain worse with lumbar extension, not lumbar flexion.  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  Hx of Charcot feet B/L Here for evaluation for chronic pain. Has lost 60 lbs in last 1 year- wasn't trying to lose weight.   Dr Ronnald Ramp ordered MRI of thoracic spine, but don't see it.   2. Pt needs new rolator- will try to get ince has been 7 years- needs rolator due to CHF and needing frequent seated rest breaks- to sit down- also has severe  lumbar stenosis and diabetic neuropathy.    3. Needs to speak to PCP about weight loss- concern for weight loss causes- since unintentional.   4. Will refer to Dr Letta Pate for possible SI joint vs lower lumbar ESI's-will need to check with Cardiologist to see if can do Epidural steroid injections/come off Eliquis x 5 days.   5. Increase Gabapentin to 300 mg nightly x 1 week; then do 300 mg 2x/day OR 600 mg nightly- for nerve pain   6. Continue Duloxetine 60 mg daily.    7. Need Urine drug screen vs oral drug screen- once it's OK- hopefully, then can write for Norco 5/325 mg 3x/day as needed  8. Opiate contract- needs to be done today - pt told if gets pain meds at ER- then needs ot let us know- if has fracture.   9. Oxycodone messed up bowels- made her very constipated.   10. F/U 3 months- on chronic pain/lumbar stenosis/nerve pain   11. Need to call Cardiology to see if can come off Eliquis for Epidural steroid injections and let me know.   I spent a total of 46   minutes on total care today- >50% coordination of care- due to education on lumbar stenosis, Epidural steroid injections and

## 2022-04-05 NOTE — Progress Notes (Signed)
Faxed copy of order for Rollator walker to [employer] along with note and demographic sheet.

## 2022-04-05 NOTE — Patient Instructions (Addendum)
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  Hx of Charcot feet B/L Here for evaluation for chronic pain. Has lost 60 lbs in last 1 year- wasn't trying to lose weight.   Dr Ronnald Ramp ordered MRI of thoracic spine, but don't see it.   2. Pt needs new rolator- will try to get ince has been 7 years- needs rolator due to CHF and needing frequent seated rest breaks- to sit down- also has severe lumbar stenosis and diabetic neuropathy.    3. Needs to speak to PCP about weight loss- concern for weight loss causes- since unintentional.   4. Will refer to Dr Letta Pate for possible SI joint vs lower lumbar ESI's-will need to check with Cardiologist to see if can do Epidural steroid injections/come off Eliquis x 5 days.   5. Increase Gabapentin to 300 mg nightly x 1 week; then do 300 mg 2x/day OR 600 mg nightly- for nerve pain   6. Continue Duloxetine 60 mg daily.    7. Need Urine drug screen vs oral drug screen- once it's OK- hopefully, then can write for Norco 5/325 mg 3x/day as needed  8. Opiate contract- needs to be done today - pt told if gets pain meds at ER- then needs ot let us know- if has fracture.   9. Oxycodone messed up bowels- made her very constipated.   10. F/U 3 months- on chronic pain/lumbar stenosis/nerve pain  11. Need to call Cardiology to see if can come off Eliquis for Epidural steroid injections and let me know.

## 2022-04-05 NOTE — Addendum Note (Signed)
Addended by: Franchot Gallo on: 04/05/2022 12:34 PM   Modules accepted: Orders

## 2022-04-06 DIAGNOSIS — R2689 Other abnormalities of gait and mobility: Secondary | ICD-10-CM | POA: Diagnosis not present

## 2022-04-06 LAB — HSV DNA BY PCR (REFERENCE LAB)
HSV 1 DNA: NEGATIVE
HSV 2 DNA: NEGATIVE

## 2022-04-07 ENCOUNTER — Telehealth: Payer: Self-pay | Admitting: *Deleted

## 2022-04-07 NOTE — Telephone Encounter (Signed)
Mrs Badgett called and reports she called Dr Elmarie Shiley office and they do not take request to stop blood thinner's from patients. We have a form we can faxed to him, you just need to get her scheduled for what type of injection you want her to have.  Once I have the date I can fax Dr Acie Fredrickson.

## 2022-04-08 LAB — DRUG TOX MONITOR 1 W/CONF, ORAL FLD
Amphetamines: NEGATIVE ng/mL (ref <10)
Barbiturates: NEGATIVE ng/mL (ref <10)
Benzodiazepines: NEGATIVE ng/mL (ref <0.50)
Buprenorphine: NEGATIVE ng/mL (ref <0.10)
Cocaine: NEGATIVE ng/mL (ref <5.0)
Codeine: NEGATIVE ng/mL (ref <2.5)
Dihydrocodeine: 4.5 ng/mL — ABNORMAL HIGH (ref <2.5)
Fentanyl: NEGATIVE ng/mL (ref <0.10)
Heroin Metabolite: NEGATIVE ng/mL (ref <1.0)
Hydrocodone: 77.4 ng/mL — ABNORMAL HIGH (ref <2.5)
Hydromorphone: NEGATIVE ng/mL (ref <2.5)
MARIJUANA: NEGATIVE ng/mL (ref <2.5)
MDMA: NEGATIVE ng/mL (ref <10)
Meprobamate: NEGATIVE ng/mL (ref <2.5)
Methadone: NEGATIVE ng/mL (ref <5.0)
Morphine: NEGATIVE ng/mL (ref <2.5)
Nicotine Metabolite: NEGATIVE ng/mL (ref <5.0)
Norhydrocodone: NEGATIVE ng/mL (ref <2.5)
Noroxycodone: NEGATIVE ng/mL (ref <2.5)
Opiates: POSITIVE ng/mL — AB (ref <2.5)
Oxycodone: NEGATIVE ng/mL (ref <2.5)
Oxymorphone: NEGATIVE ng/mL (ref <2.5)
Phencyclidine: NEGATIVE ng/mL (ref <10)
Tapentadol: NEGATIVE ng/mL (ref <5.0)
Tramadol: NEGATIVE ng/mL (ref <5.0)
Zolpidem: NEGATIVE ng/mL (ref <5.0)

## 2022-04-08 LAB — DRUG TOX ALC METAB W/CON, ORAL FLD: Alcohol Metabolite: NEGATIVE ng/mL (ref <25)

## 2022-04-14 ENCOUNTER — Telehealth: Payer: Self-pay | Admitting: *Deleted

## 2022-04-14 NOTE — Telephone Encounter (Signed)
Gabapentin is making her too unsteady on her feet at night so she wants to go back to the 200 mg dose which she was on and tolerated well.  Her appt for SI injection is 06/06/22.

## 2022-04-16 ENCOUNTER — Encounter: Payer: Self-pay | Admitting: Cardiovascular Disease

## 2022-04-16 NOTE — Progress Notes (Unsigned)
Cardiology Office Note:    Date:  04/17/2022   ID:  Sabrina Baker, DOB 1941-05-27, MRN [id number]  PCP:  [name], MD  Cardiologist:  Kyi Romanello  Electrophysiologist:  None   Referring MD: [name], MD   Chief Complaint  Patient presents with   Atrial Fibrillation        Congestive Heart Failure         Problem List 1. Paroxysmal atrial fib: CHADS2VASC = 7 ( female, age, TIA, HTN, DM)   2.  HTN 3, Covid 19 infection , April 2020  4.  DM  History of Present Illness:    Sabrina Baker is a 81 y.o. female with a  Recent hospitalization for COVID-19.  She had a prolonged hospitalization involving prolonged intubation.  She had paroxysmal atrial fibrillation.  She was placed on Eliquis at that time.  She was discharged in normal sinus rhythm.  She has been to [facility], [facility], [facility] She developed atrial fib while she was intubated and sick with COVID 19 . Has improved quite a bit since then.   Breathing has improved.  Has an occasional cough .   Her son died in 2022/11/21 of this year of CHF.   We discussed this   Sabrina Baker. 18, 2021 Sabrina Baker is seen today for follow up of her PAF, hyperlipidemia, and HTN. Her episode of PAF was related to a COVID infection Exercises some,  Has been doing some walking . Still fatigued from her covid .   Has occasional twinges in her left chest  Reports some swelling of her right leg, goes down at night  Is on eliquis so unlikely to be a DVT . No further arrhythmias to suggest afib .  Is a vegetarian - eats no meat   Nov. 7, 2022 [name] is seen today for follow up of her PAF, HLD, HTN Was in the hospital for facial cellulitis, acute diastolic CHF  Required oxygen for a while Now is saturating well without supplemental O2 now CXR last Friday looked ok    Nov. 6, 2023 Sabrina Baker is seen today for follow up of her PAF, HLD, HTN Hx of diastolic CHF   Breathing has been ok      Past Medical History:  Diagnosis  Date   Acute diastolic congestive heart failure ([place])    Carotid stenosis    Carotid US 1/22: Bilateral ICA 1-39; follow-up as needed   Chronic kidney disease    stage 3   Diabetes mellitus without complication ([place])    Gout    Hypercholesteremia    Hypertension    Neuromuscular disorder ([place])    neuropathy   Osteopenia    Retinal detachment    TIA (transient ischemic attack)    Vitamin B12 deficiency     Past Surgical History:  Procedure Laterality Date   25 GAUGE PARS PLANA VITRECTOMY WITH 20 GAUGE MVR PORT Right 03/16/2015   Procedure: 25 GAUGE PARS PLANA VITRECTOMY WITH 23 GAUGE MVR PORT;  Surgeon: Hayden Pedro, MD;  Location: [place];  Service: Ophthalmology;  Laterality: Right;   ANKLE SURGERY     BREAST EXCISIONAL BIOPSY     BREAST LUMPECTOMY     BREAST LUMPECTOMY WITH RADIOACTIVE SEED LOCALIZATION Right 01/24/2016   Procedure: RIGHT BREAST LUMPECTOMY WITH RADIOACTIVE SEED LOCALIZATION;  Surgeon: Coralie Keens, MD;  Location: [place];  Service: General;  Laterality: Right;   EYE SURGERY     detached retna X2  HEEL SPUR SURGERY  2012   INCISION AND DRAINAGE ABSCESS Right 11/21/2019   Procedure: INCISION AND DRAINAGE FOREHEAD & RIGHT EAR ABSCESS;  Surgeon: Jerrell Belfast, MD;  Location: [place];  Service: ENT;  Laterality: Right;   INJECTION OF SILICONE OIL Right 80/99/8338   Procedure: Extraction OF SILICONE OIL;  Surgeon: Hayden Pedro, MD;  Location: [place];  Service: Ophthalmology;  Laterality: Right;   ORIF ANKLE FRACTURE Left 06/17/2014   Procedure: OPEN REDUCTION INTERNAL FIXATION (ORIF) LEFT BIMALLEOLAR ANKLE FRACTURE;  Surgeon: Marianna Payment, MD;  Location: [place];  Service: Orthopedics;  Laterality: Left;   PHOTOCOAGULATION WITH LASER Right 03/16/2015   Procedure: PHOTOCOAGULATION WITH LASER;  Surgeon: Hayden Pedro, MD;  Location: [place];  Service: Ophthalmology;  Laterality: Right;   SHOULDER ARTHROSCOPY Right    plates and screws    VITRECTOMY Right 03/16/2015   WRIST ARTHROPLASTY Left    plates and screws and bone graft    Current Medications: Current Meds  Medication Sig   acetaminophen (TYLENOL) 650 MG CR tablet Take 1,300 mg by mouth every 8 (eight) hours as needed for pain.   apixaban (ELIQUIS) 5 MG TABS tablet Take 1 tablet (5 mg total) by mouth 2 (two) times daily.   atorvastatin (LIPITOR) 10 MG tablet Take 10 mg by mouth daily.   Blood Glucose Monitoring Suppl (GLUCOCOM BLOOD GLUCOSE MONITOR) DEVI Use to test blood sugar 3 times a day (E11.65)   brimonidine (ALPHAGAN) 0.2 % ophthalmic solution Place 1 drop into the left eye 2 (two) times daily.   calcitRIOL (ROCALTROL) 0.25 MCG capsule Take 0.25 mcg by mouth 3 (three) times a week. Mon, Wed, Friday   calcium-vitamin D (OSCAL WITH D) 500-200 MG-UNIT TABS tablet Take 1 tablet by mouth daily.    carvedilol (COREG) 25 MG tablet Take 25 mg by mouth 2 (two) times daily with a meal.   colchicine 0.6 MG tablet Take 0.5 tablets (0.3 mg total) by mouth daily. Started 04/07/13   [facility] B-12 500 MCG tablet Take 1,000 mcg by mouth daily.   diclofenac Sodium (VOLTAREN) 1 % GEL Apply topically 4 (four) times daily.   DULoxetine (CYMBALTA) 60 MG capsule Take 60 mg by mouth daily.   febuxostat (ULORIC) 40 MG tablet Take 40 mg by mouth daily. One tab a day   Ferrous Sulfate (IRON) 325 (65 FE) MG TABS Take 1 tablet by mouth 2 (two) times daily.   furosemide (LASIX) 40 MG tablet Take 1 tablet (40 mg total) by mouth daily.   gabapentin (NEURONTIN) 100 MG capsule Take 200 mg by mouth at bedtime.   HYDROcodone-acetaminophen (NORCO/VICODIN) 5-325 MG tablet Take 1 tablet by mouth every 6 (six) hours as needed for moderate pain.   insulin glargine, 2 Unit Dial, (TOUJEO MAX SOLOSTAR) 300 UNIT/ML Solostar Pen Inject 10 Units into the skin daily.   Insulin Pen Needle 32G X 4 MM MISC USE TO INJECT INSULIN ONCE A DAY   ketoconazole (NIZORAL) 2 % shampoo Apply 1 application topically 2 (two)  times a week.    Lancets 30G MISC Use to test blood sugars 3 times a day (Dx: E11.65)   mupirocin cream (BACTROBAN) 2 % Apply topically 2 (two) times daily. Apply to forehead wound until healed   nateglinide (STARLIX) 120 MG tablet Take 120 mg by mouth 3 (three) times daily as needed. If CBG is above 130   ONETOUCH VERIO test strip USE AS DIRECTED. TESTING FREQUENCY  3 X/DAILY (DX E11.65)  triamcinolone cream (KENALOG) 0.1 % Apply 1 application topically 2 (two) times daily as needed (For rash).    trimethoprim-polymyxin b (POLYTRIM) ophthalmic solution Use 4 times in left eye the day of injection, 4 drop the following day.   TRULICITY 1.5 WH/6.7RF SOPN Inject 3 mg into the skin every 7 (seven) days. Friday     Allergies:   Ramipril, Other, Cortisone, Niacin, Niacin and related, and Valsartan   Social History   Socioeconomic History   Marital status: Divorced    Spouse name: Not on file   Number of children: Not on file   Years of education: Not on file   Highest education level: Not on file  Occupational History   Not on file  Tobacco Use   Smoking status: Never   Smokeless tobacco: Never  Vaping Use   Vaping Use: Never used  Substance and Sexual Activity   Alcohol use: No   Drug use: No   Sexual activity: Never    Birth control/protection: Post-menopausal  Other Topics Concern   Not on file  Social History Narrative   Not on file   Social Determinants of Health   Financial Resource Strain: Not on file  Food Insecurity: Not on file  Transportation Needs: Not on file  Physical Activity: Not on file  Stress: Not on file  Social Connections: Not on file     Family History: The patient's family history is not on file.  ROS:   Please see the history of present illness.     All other systems reviewed and are negative.  EKGs/Labs/Other Studies Reviewed:    The following studies were reviewed today:     Recent Labs: No results found for requested labs within last  365 days.  Recent Lipid Panel    Component Value Date/Time   CHOL 134 04/18/2012 0540   TRIG 76 09/22/2018 0254   HDL 58 04/18/2012 0540   CHOLHDL 2.3 04/18/2012 0540   VLDL 14 04/18/2012 0540   LDLCALC 62 04/18/2012 0540    Physical Exam:     Physical Exam: Blood pressure 120/72, pulse 73, height '5\' 3"'$  (1.6 m), weight 133 lb 6.4 oz (60.5 kg), SpO2 92 %.       GEN:  Well nourished, well developed in no acute distress HEENT: Normal NECK: No JVD; No carotid bruits LYMPHATICS: No lymphadenopathy CARDIAC: RRR , soft systolic murmur  RESPIRATORY:  Clear to auscultation without rales, wheezing or rhonchi  ABDOMEN: Soft, non-tender, non-distended MUSCULOSKELETAL:  No edema; No deformity  SKIN: Warm and dry NEUROLOGIC:  Alert and oriented x 3   ECG:   Nov. 6, 2023:  NSR at 73.  Poor R wave progression - likely due to lead placement     ASSESSMENT:    1. Paroxysmal atrial fibrillation (HCC)   2. Essential hypertension   3. Nonrheumatic aortic valve stenosis      PLAN:      PAF:   CHADS2VASC is 63 ( female, age, TIA, HTN, DM ).    Is maintaining NSR      2. HTN:    BP is well controlled.   3.  Hyperlipidema:      Stable   4.  Aortic stenosis:  Stable     Medication Adjustments/Labs and Tests Ordered: Current medicines are reviewed at length with the patient today.  Concerns regarding medicines are outlined above.  Orders Placed This Encounter  Procedures   EKG 12-Lead    No orders of the  defined types were placed in this encounter.    Patient Instructions  Medication Instructions:  Your physician recommends that you continue on your current medications as directed. Please refer to the Current Medication list given to you today.  *If you need a refill on your cardiac medications before your next appointment, please call your pharmacy*   Lab Work: NONE If you have labs (blood work) drawn today and your tests are completely normal, you will receive  your results only by: [place] (if you have MyChart) OR A paper copy in the mail If you have any lab test that is abnormal or we need to change your treatment, we will call you to review the results.   Testing/Procedures: NONE   Follow-Up: At [facility], you and your health needs are our priority.  As part of our continuing mission to provide you with exceptional heart care, we have created designated Provider Care Teams.  These Care Teams include your primary Cardiologist (physician) and Advanced Practice Providers (APPs -  Physician Assistants and Nurse Practitioners) who all work together to provide you with the care you need, when you need it.  We recommend signing up for the patient portal called "MyChart".  Sign up information is provided on this After Visit Summary.  MyChart is used to connect with patients for Virtual Visits (Telemedicine).  Patients are able to view lab/test results, encounter notes, upcoming appointments, etc.  Non-urgent messages can be sent to your provider as well.   To learn more about what you can do with MyChart, go to [email or web address].    Your next appointment:   1 year(s)  The format for your next appointment:   In Person  Provider:   Mertie Moores, MD       Important Information About Sugar         Signed, Mertie Moores, MD  04/17/2022 11:19 AM    [place]

## 2022-04-17 ENCOUNTER — Encounter: Payer: Self-pay | Admitting: Cardiovascular Disease

## 2022-04-17 ENCOUNTER — Ambulatory Visit: Payer: Medicare Other | Attending: Cardiovascular Disease | Admitting: Cardiovascular Disease

## 2022-04-17 VITALS — BP 120/72 | HR 73 | Ht 63.0 in | Wt 133.4 lb

## 2022-04-17 DIAGNOSIS — I480 Paroxysmal atrial fibrillation: Secondary | ICD-10-CM | POA: Diagnosis not present

## 2022-04-17 DIAGNOSIS — I350 Nonrheumatic aortic (valve) stenosis: Secondary | ICD-10-CM | POA: Diagnosis not present

## 2022-04-17 DIAGNOSIS — I10 Essential (primary) hypertension: Secondary | ICD-10-CM

## 2022-04-17 NOTE — Telephone Encounter (Signed)
I think she is ok right now with what she has.

## 2022-04-17 NOTE — Patient Instructions (Signed)
Medication Instructions:  Your physician recommends that you continue on your current medications as directed. Please refer to the Current Medication list given to you today.  *If you need a refill on your cardiac medications before your next appointment, please call your pharmacy*   Lab Work: NONE If you have labs (blood work) drawn today and your tests are completely normal, you will receive your results only by: MyChart Message (if you have MyChart) OR A paper copy in the mail If you have any lab test that is abnormal or we need to change your treatment, we will call you to review the results.   Testing/Procedures: NONE   Follow-Up: At [facility], you and your health needs are our priority.  As part of our continuing mission to provide you with exceptional heart care, we have created designated Provider Care Teams.  These Care Teams include your primary Cardiologist (physician) and Advanced Practice Providers (APPs -  Physician Assistants and Nurse Practitioners) who all work together to provide you with the care you need, when you need it.  We recommend signing up for the patient portal called "MyChart".  Sign up information is provided on this After Visit Summary.  MyChart is used to connect with patients for Virtual Visits (Telemedicine).  Patients are able to view lab/test results, encounter notes, upcoming appointments, etc.  Non-urgent messages can be sent to your provider as well.   To learn more about what you can do with MyChart, go to [email or web address].    Your next appointment:   1 year(s)  The format for your next appointment:   In Person  Provider:   Philip Nahser, MD       Important Information About Sugar       

## 2022-04-24 ENCOUNTER — Telehealth: Payer: Self-pay

## 2022-04-24 MED ORDER — HYDROCODONE-ACETAMINOPHEN 5-325 MG PO TABS: 1.0000 | ORAL_TABLET | Freq: Four times a day (QID) | ORAL | 0 refills | Status: DC | PRN

## 2022-04-24 NOTE — Telephone Encounter (Signed)
[facility] [place] PMP: Filled  Written  ID  Drug  QTY  Days  Prescriber  RX #  Dispenser  Refill  Daily Dose*  Pymt Type  PMP  04/05/2022 04/05/2022 2  Gabapentin 300 Mg Capsule 60.00 30 Me Lov [id number] Nor (2879) 0/5  Medicare [place] 03/29/2022 03/29/2022 2  Hydrocodone-Acetamin 5-325 Mg 90.00 30 Cy Whi [id number] Nor (2879) 0/0 15.00 MME Medicare [place] 03/10/2022 03/08/2022 2  Hydrocodone-Acetamin 10-325 Mg 20.00 14 Cy Whi [id number] Nor (2879) 0/0 14.29 MME Medicare H. Rivera Colon

## 2022-04-26 ENCOUNTER — Encounter (INDEPENDENT_AMBULATORY_CARE_PROVIDER_SITE_OTHER): Payer: Medicare Other | Admitting: Ophthalmology

## 2022-04-26 DIAGNOSIS — H43812 Vitreous degeneration, left eye: Secondary | ICD-10-CM | POA: Diagnosis not present

## 2022-04-26 DIAGNOSIS — I10 Essential (primary) hypertension: Secondary | ICD-10-CM | POA: Diagnosis not present

## 2022-04-26 DIAGNOSIS — H35373 Puckering of macula, bilateral: Secondary | ICD-10-CM

## 2022-04-26 DIAGNOSIS — E113511 Type 2 diabetes mellitus with proliferative diabetic retinopathy with macular edema, right eye: Secondary | ICD-10-CM | POA: Diagnosis not present

## 2022-04-26 DIAGNOSIS — E113312 Type 2 diabetes mellitus with moderate nonproliferative diabetic retinopathy with macular edema, left eye: Secondary | ICD-10-CM

## 2022-04-26 DIAGNOSIS — H35033 Hypertensive retinopathy, bilateral: Secondary | ICD-10-CM | POA: Diagnosis not present

## 2022-05-01 DIAGNOSIS — N183 Chronic kidney disease, stage 3 unspecified: Secondary | ICD-10-CM | POA: Diagnosis not present

## 2022-05-01 DIAGNOSIS — I129 Hypertensive chronic kidney disease with stage 1 through stage 4 chronic kidney disease, or unspecified chronic kidney disease: Secondary | ICD-10-CM | POA: Diagnosis not present

## 2022-05-01 DIAGNOSIS — D6869 Other thrombophilia: Secondary | ICD-10-CM | POA: Diagnosis not present

## 2022-05-01 DIAGNOSIS — I480 Paroxysmal atrial fibrillation: Secondary | ICD-10-CM | POA: Diagnosis not present

## 2022-05-01 DIAGNOSIS — G894 Chronic pain syndrome: Secondary | ICD-10-CM | POA: Diagnosis not present

## 2022-05-01 DIAGNOSIS — E441 Mild protein-calorie malnutrition: Secondary | ICD-10-CM | POA: Diagnosis not present

## 2022-05-01 DIAGNOSIS — E1121 Type 2 diabetes mellitus with diabetic nephropathy: Secondary | ICD-10-CM | POA: Diagnosis not present

## 2022-05-17 ENCOUNTER — Ambulatory Visit (INDEPENDENT_AMBULATORY_CARE_PROVIDER_SITE_OTHER): Payer: Medicare Other | Admitting: Podiatry

## 2022-05-17 DIAGNOSIS — E1142 Type 2 diabetes mellitus with diabetic polyneuropathy: Secondary | ICD-10-CM

## 2022-05-17 DIAGNOSIS — M79675 Pain in left toe(s): Secondary | ICD-10-CM | POA: Diagnosis not present

## 2022-05-17 DIAGNOSIS — M79674 Pain in right toe(s): Secondary | ICD-10-CM

## 2022-05-17 DIAGNOSIS — B351 Tinea unguium: Secondary | ICD-10-CM

## 2022-05-17 DIAGNOSIS — M2042 Other hammer toe(s) (acquired), left foot: Secondary | ICD-10-CM

## 2022-05-17 DIAGNOSIS — M2041 Other hammer toe(s) (acquired), right foot: Secondary | ICD-10-CM

## 2022-05-17 NOTE — Progress Notes (Signed)
  Subjective:  Patient ID: Sabrina Baker, female    DOB: Aug 23, 1940,  MRN: [id number]  Chief Complaint  Patient presents with   Nail Problem   81 y.o. female returns for the above complaint.  Patient presents with thickened elongated dystrophic toenails x10.  Pain on palpation.  Patient would like for me to debride down which is not able to do it herself.  She denies any other acute complaints.  Objective:  There were no vitals filed for this visit. Podiatric Exam: Vascular: dorsalis pedis and posterior tibial pulses are palpable bilateral. Capillary return is immediate. Temperature gradient is WNL. Skin turgor WNL  Sensorium: Normal Semmes Weinstein monofilament test. Normal tactile sensation bilaterally. Nail Exam: Pt has thick disfigured discolored nails with subungual debris noted bilateral entire nail hallux through fifth toenails.  Pain on palpation to the nails. Ulcer Exam: There is no evidence of ulcer or pre-ulcerative changes or infection. Orthopedic Exam: Muscle tone and strength are WNL. No limitations in general ROM. No crepitus or effusions noted. HAV  B/L.  Hammer toes 2-5  B/L. Skin: No Porokeratosis. No infection or ulcers    Assessment & Plan:   No diagnosis found.    Patient was evaluated and treated and all questions answered.  Hammertoes 2 through 5 bilateral -I explained to the patient the etiology of hammertoe contractures and various treatment options were discussed.  Given the amount of pain she is having I believe patient will benefit from diabetic shoes to prevent ulceration given the contractures.   -Diabetic shoes are functioning well.   Onychomycosis with pain  -Nails palliatively debrided as below. -Educated on self-care  Procedure: Nail Debridement Rationale: pain  Type of Debridement: manual, sharp debridement. Instrumentation: Nail nipper, rotary burr. Number of Nails: 10  Procedures and Treatment: Consent by patient was obtained for  treatment procedures. The patient understood the discussion of treatment and procedures well. All questions were answered thoroughly reviewed. Debridement of mycotic and hypertrophic toenails, 1 through 5 bilateral and clearing of subungual debris. No ulceration, no infection noted.  Return Visit-Office Procedure: Patient instructed to return to the office for a follow up visit 3 months for continued evaluation and treatment.  Boneta Lucks, DPM    No follow-ups on file.

## 2022-05-24 ENCOUNTER — Encounter (INDEPENDENT_AMBULATORY_CARE_PROVIDER_SITE_OTHER): Payer: Medicare Other | Admitting: Ophthalmology

## 2022-05-24 ENCOUNTER — Telehealth: Payer: Self-pay

## 2022-05-24 ENCOUNTER — Telehealth: Payer: Self-pay | Admitting: Physical Medicine and Rehabilitation

## 2022-05-24 DIAGNOSIS — H43812 Vitreous degeneration, left eye: Secondary | ICD-10-CM | POA: Diagnosis not present

## 2022-05-24 DIAGNOSIS — E103312 Type 1 diabetes mellitus with moderate nonproliferative diabetic retinopathy with macular edema, left eye: Secondary | ICD-10-CM

## 2022-05-24 DIAGNOSIS — E103511 Type 1 diabetes mellitus with proliferative diabetic retinopathy with macular edema, right eye: Secondary | ICD-10-CM

## 2022-05-24 DIAGNOSIS — H338 Other retinal detachments: Secondary | ICD-10-CM | POA: Diagnosis not present

## 2022-05-24 MED ORDER — HYDROCODONE-ACETAMINOPHEN 5-325 MG PO TABS: 1.0000 | ORAL_TABLET | Freq: Four times a day (QID) | ORAL | 0 refills | Status: DC | PRN

## 2022-05-24 NOTE — Telephone Encounter (Signed)
Dr. Dagoberto Ligas is out of the office.   Mrs. Mallinger has call twice for a refill of the Hydroodone 5-325. If granted [facility] [place] [facility] [place]. Thank you

## 2022-05-24 NOTE — Telephone Encounter (Signed)
Patient is requesting a refill on hydrocodone.  [facility] would [facility].

## 2022-05-24 NOTE — Telephone Encounter (Signed)
PMP was Reviewed.  Dr Dagoberto Ligas Note was Reviewed.  Hydrocodone e-scribed today.  Call Placed to Ms. La Bolt regarding the above, no answer. Unable to leave a message.

## 2022-05-25 MED ORDER — HYDROCODONE-ACETAMINOPHEN 5-325 MG PO TABS: 1.0000 | ORAL_TABLET | Freq: Four times a day (QID) | ORAL | 0 refills | Status: DC | PRN

## 2022-05-25 NOTE — Telephone Encounter (Signed)
Checked UDS. It appears consistent. Rx written and sent to the pharmacy. Thanks!

## 2022-05-25 NOTE — Addendum Note (Signed)
Addended by: Alger Simons T on: 05/25/2022 10:43 AM   Modules accepted: Orders

## 2022-05-26 ENCOUNTER — Telehealth: Payer: Self-pay

## 2022-05-26 NOTE — Telephone Encounter (Signed)
Patient called in to verify and see if we sent in prescription or if pharmacy has medication ready for pick up. She was complaining about not being able to get in touch w/ pharmacist. Reached out to pharmacy and they will have it ready for pick up.

## 2022-05-31 ENCOUNTER — Ambulatory Visit
Admission: RE | Admit: 2022-05-31 | Discharge: 2022-05-31 | Disposition: A | Discharge status: Home / Self Care | Payer: Medicare Other | Source: Ambulatory Visit | Attending: Family Medicine | Admitting: Family Medicine

## 2022-05-31 DIAGNOSIS — Z1231 Encounter for screening mammogram for malignant neoplasm of breast: Secondary | ICD-10-CM

## 2022-06-02 ENCOUNTER — Other Ambulatory Visit: Payer: Self-pay | Admitting: Family Medicine

## 2022-06-02 ENCOUNTER — Encounter: Payer: Self-pay | Admitting: Family Medicine

## 2022-06-02 DIAGNOSIS — R928 Other abnormal and inconclusive findings on diagnostic imaging of breast: Secondary | ICD-10-CM

## 2022-06-06 ENCOUNTER — Encounter: Payer: Medicare Other | Attending: Physical Medicine and Rehabilitation | Admitting: Physical Medicine & Rehabilitation

## 2022-06-06 ENCOUNTER — Encounter: Payer: Self-pay | Admitting: Physical Medicine & Rehabilitation

## 2022-06-06 VITALS — BP 153/86 | HR 79 | Temp 99.6°F | Ht 63.0 in | Wt 132.0 lb

## 2022-06-06 DIAGNOSIS — M533 Sacrococcygeal disorders, not elsewhere classified: Secondary | ICD-10-CM | POA: Diagnosis not present

## 2022-06-06 MED ORDER — BETAMETHASONE SOD PHOS & ACET 6 (3-3) MG/ML IJ SUSP
6.0000 mg | Freq: Once | INTRAMUSCULAR | Status: AC
  Administered 2022-06-06: 6 mg via INTRAMUSCULAR

## 2022-06-06 MED ORDER — LIDOCAINE HCL (PF) 2 % IJ SOLN
1.0000 mL | Freq: Once | INTRAMUSCULAR | Status: AC
  Administered 2022-06-06: 1 mL

## 2022-06-06 MED ORDER — LIDOCAINE HCL 1 % IJ SOLN
2.0000 mL | Freq: Once | INTRAMUSCULAR | Status: AC
  Administered 2022-06-06: 2 mL

## 2022-06-06 NOTE — Progress Notes (Signed)

## 2022-06-06 NOTE — Patient Instructions (Signed)
Sacroiliac injection was performed today. A combination of numbing medicine (lidocaine) plus a cortisone medicine (betamethasone) was injected. The injection was done under x-ray guidance. This procedure has been performed to help reduce low back and buttocks pain as well as potentially hip pain. The duration of this injection is variable lasting from hours to  Months. It may repeated if needed. 

## 2022-06-06 NOTE — Progress Notes (Signed)
  PROCEDURE RECORD [facility]   Name: Sabrina Baker DOB:29-Oct-1940 MRN: [id number]  Date:06/06/2022  Physician: Alysia Penna, MD    Nurse/CMA: Truman Hayward, CMA  Allergies:  Allergies  Allergen Reactions   Ramipril Anaphylaxis and Other (See Comments)   Other Other (See Comments)   Cortisone Itching and Rash   Niacin Diarrhea    And nausea   Niacin And Related Diarrhea   Valsartan Other (See Comments)    Hyperkalemia     Consent Signed: Yes.    Is patient diabetic? Yes.    CBG today? 148  Pregnant: No. LMP: No LMP recorded. Patient is postmenopausal. (age 22-55)  Anticoagulants: yes (Eliquis) Anti-inflammatory: no Antibiotics: no  Procedure: Sacroiliac Joint Injection Position: Prone Start Time: 12:58 pm  End Time: 1:02 pm Fluoro Time: 10  RN/CMA Marvis Moeller, CMA    Time 12:34 PM 1:10 pm    BP 153/86 143/84    Pulse 79 82    Respirations 16 16    O2 Sat 95 90    S/S 6 6    Pain Level 3/10 0/10     D/C home with Theodoro Kos Friend, patient A & O X 3, D/C instructions reviewed, and sits independently.

## 2022-06-21 ENCOUNTER — Ambulatory Visit: Payer: Medicare Other

## 2022-06-21 DIAGNOSIS — H40022 Open angle with borderline findings, high risk, left eye: Secondary | ICD-10-CM | POA: Diagnosis not present

## 2022-06-21 DIAGNOSIS — Z961 Presence of intraocular lens: Secondary | ICD-10-CM | POA: Diagnosis not present

## 2022-06-21 DIAGNOSIS — H16231 Neurotrophic keratoconjunctivitis, right eye: Secondary | ICD-10-CM | POA: Diagnosis not present

## 2022-06-21 DIAGNOSIS — H401113 Primary open-angle glaucoma, right eye, severe stage: Secondary | ICD-10-CM | POA: Diagnosis not present

## 2022-06-21 DIAGNOSIS — H1711 Central corneal opacity, right eye: Secondary | ICD-10-CM | POA: Diagnosis not present

## 2022-06-22 ENCOUNTER — Telehealth: Payer: Self-pay

## 2022-06-22 MED ORDER — HYDROCODONE-ACETAMINOPHEN 5-325 MG PO TABS: 1.0000 | ORAL_TABLET | Freq: Three times a day (TID) | ORAL | 0 refills | Status: DC | PRN

## 2022-06-22 NOTE — Telephone Encounter (Signed)
Sent in [place] 5/325 mg- however since we discussed filling 90/month, will send in that dose- it also fits the way pt taking it.   That's also what we discussed at my visit with her in October-  [facility] [place] [place]

## 2022-06-22 NOTE — Addendum Note (Signed)
Addended by: Boneta Lucks on: 06/22/2022 08:08 AM   Modules accepted: Orders

## 2022-06-22 NOTE — Telephone Encounter (Addendum)
Patient currently has #25  Hydrocodone 5-325 MG on hand. She is requesting a refill to be sent and put on hold for refilling.

## 2022-06-23 DIAGNOSIS — N189 Chronic kidney disease, unspecified: Secondary | ICD-10-CM | POA: Diagnosis not present

## 2022-06-23 DIAGNOSIS — Z794 Long term (current) use of insulin: Secondary | ICD-10-CM | POA: Diagnosis not present

## 2022-06-23 DIAGNOSIS — E1165 Type 2 diabetes mellitus with hyperglycemia: Secondary | ICD-10-CM | POA: Diagnosis not present

## 2022-06-23 DIAGNOSIS — E782 Mixed hyperlipidemia: Secondary | ICD-10-CM | POA: Diagnosis not present

## 2022-06-23 DIAGNOSIS — E1122 Type 2 diabetes mellitus with diabetic chronic kidney disease: Secondary | ICD-10-CM | POA: Diagnosis not present

## 2022-06-23 DIAGNOSIS — Z7984 Long term (current) use of oral hypoglycemic drugs: Secondary | ICD-10-CM | POA: Diagnosis not present

## 2022-06-23 DIAGNOSIS — Z7985 Long-term (current) use of injectable non-insulin antidiabetic drugs: Secondary | ICD-10-CM | POA: Diagnosis not present

## 2022-06-23 DIAGNOSIS — E1140 Type 2 diabetes mellitus with diabetic neuropathy, unspecified: Secondary | ICD-10-CM | POA: Diagnosis not present

## 2022-06-26 ENCOUNTER — Encounter (INDEPENDENT_AMBULATORY_CARE_PROVIDER_SITE_OTHER): Payer: 59 | Admitting: Ophthalmology

## 2022-06-26 DIAGNOSIS — I10 Essential (primary) hypertension: Secondary | ICD-10-CM

## 2022-06-26 DIAGNOSIS — H43812 Vitreous degeneration, left eye: Secondary | ICD-10-CM | POA: Diagnosis not present

## 2022-06-26 DIAGNOSIS — E113312 Type 2 diabetes mellitus with moderate nonproliferative diabetic retinopathy with macular edema, left eye: Secondary | ICD-10-CM | POA: Diagnosis not present

## 2022-06-26 DIAGNOSIS — E113511 Type 2 diabetes mellitus with proliferative diabetic retinopathy with macular edema, right eye: Secondary | ICD-10-CM

## 2022-06-26 DIAGNOSIS — H33301 Unspecified retinal break, right eye: Secondary | ICD-10-CM | POA: Diagnosis not present

## 2022-06-26 DIAGNOSIS — H35033 Hypertensive retinopathy, bilateral: Secondary | ICD-10-CM

## 2022-06-28 ENCOUNTER — Ambulatory Visit
Admission: RE | Admit: 2022-06-28 | Discharge: 2022-06-28 | Disposition: A | Discharge status: Home / Self Care | Payer: 59 | Source: Ambulatory Visit | Attending: Family Medicine | Admitting: Family Medicine

## 2022-06-28 ENCOUNTER — Other Ambulatory Visit: Payer: Self-pay | Admitting: Family Medicine

## 2022-06-28 ENCOUNTER — Ambulatory Visit
Admission: RE | Admit: 2022-06-28 | Discharge: 2022-06-28 | Disposition: A | Discharge status: Home / Self Care | Payer: Medicare Other | Source: Ambulatory Visit | Attending: Family Medicine | Admitting: Family Medicine

## 2022-06-28 DIAGNOSIS — R928 Other abnormal and inconclusive findings on diagnostic imaging of breast: Secondary | ICD-10-CM

## 2022-06-28 DIAGNOSIS — N6315 Unspecified lump in the right breast, overlapping quadrants: Secondary | ICD-10-CM | POA: Diagnosis not present

## 2022-06-28 DIAGNOSIS — C50811 Malignant neoplasm of overlapping sites of right female breast: Secondary | ICD-10-CM | POA: Diagnosis not present

## 2022-06-28 DIAGNOSIS — N6310 Unspecified lump in the right breast, unspecified quadrant: Secondary | ICD-10-CM

## 2022-06-28 HISTORY — PX: BREAST BIOPSY: SHX20

## 2022-06-29 ENCOUNTER — Ambulatory Visit (INDEPENDENT_AMBULATORY_CARE_PROVIDER_SITE_OTHER): Payer: 59

## 2022-06-29 DIAGNOSIS — E11610 Type 2 diabetes mellitus with diabetic neuropathic arthropathy: Secondary | ICD-10-CM

## 2022-06-29 DIAGNOSIS — M2042 Other hammer toe(s) (acquired), left foot: Secondary | ICD-10-CM

## 2022-06-29 DIAGNOSIS — E1142 Type 2 diabetes mellitus with diabetic polyneuropathy: Secondary | ICD-10-CM

## 2022-06-29 DIAGNOSIS — M2041 Other hammer toe(s) (acquired), right foot: Secondary | ICD-10-CM

## 2022-06-29 NOTE — Progress Notes (Signed)
Patient presents to the office today for diabetic shoe and insole measuring.  Patient was measured with brannock device to determine size and width for 1 pair of extra depth shoes and foam casted for 3 pair of insoles.   ABN signed.   Documentation of medical necessity will be sent to patient's treating diabetic doctor to verify and sign.   Patient's diabetic provider: Tacey Heap, DO (Attending) NPI: [id number]   Shoes and insoles will be ordered at that time and patient will be notified for an appointment for fitting when they arrive.   Brannock measurement: 6.5 M  Patient shoe selection-   1st   Shoe choice:   813 NEW BALANCE  Shoe size ordered: 7 M

## 2022-07-04 NOTE — Progress Notes (Unsigned)
Office Visit Note  Patient: Sabrina Baker             Date of Birth: June 17, 1940           MRN: [id number]             PCP: [name], MD Referring: [name], MD Visit Date: 07/05/2022   Subjective:  No chief complaint on file.   History of Present Illness: Sabrina Baker is a 82 y.o. female here for follow up ***   Previous HPI 07/06/21 Sabrina Baker is a 82 y.o. 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 She has increased her medication dose since the last visit without incident. Her right thumb IP joint remains enlarged with limited mobility but not causing much pain, no repeat episodes of redness and swelling. She is following up with Dr. Tempie Donning for her hand arthritis and deformity, currently observing unless it becomes unstable. She reports somewhat new or increased pain affecting both distal legs.   Previous HPI 01/21/21 Sabrina Baker is a 82 y.o. female here for erosive tophaceous gout.  She has longstanding but very infrequent history of gout limited to her lower extremity in the past.  In 2020 when hospitalized with COVID illness she experienced acute gouty arthritis of the right thumb with severe pain and swelling.  This had subsided with no persistent symptoms but she redeveloped right thumb pain and swelling worsening in May.  She does not recall any critical illness associated with triggering this new flare.  Outside of the right thumb she is not experiencing any other particularly swollen red or abnormally painful sites.  Hand was evaluated by orthopedics clinic with Dr. Erlinda Hong findings felt to represent gouty arthritis x-ray showing severe erosive changes of the head of proximal phalanx and around the interphalangeal joint.  Lab findings show elevated white blood cell count of 15.4, uric acid of 11.2, mild and impairment of GFR on July labs.  She was started on allopurinol at 100 mg daily dose.   No  Rheumatology ROS completed.   PMFS History:  Patient Active Problem List   Diagnosis Date Noted   Spinal stenosis, lumbar region, with neurogenic claudication 04/05/2022   Nerve pain due to spinal stenosis 04/05/2022   Abnormality of gait 57/84/6962   Acute diastolic congestive heart failure (HCC)    Pericardial effusion    Aortic valve stenosis    Hypoxia    Acute idiopathic gout    Facial cellulitis 03/04/2021   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   Staph aureus infection 11/20/2019   Cellulitis 11/19/2019   Atrial fibrillation ([place]) 12/23/2018   ARDS (adult respiratory distress syndrome) ([place])    COVID-19    Mixed dyslipidemia 07/10/2016   Osteopenia 07/10/2016   Vitamin B12 deficiency 07/10/2016   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/19/2015   Gastroenteritis 04/17/2012   Dehydration 04/17/2012   Diabetes mellitus ([place]) 04/17/2012   HTN (hypertension) 04/17/2012   TIA (transient ischemic attack) 04/17/2012    Past Medical History:  Diagnosis Date   Acute diastolic congestive heart failure ([place])    Carotid stenosis    Carotid US 1/22: Bilateral ICA 1-39; follow-up as needed   Chronic kidney disease    stage 3   Diabetes mellitus without complication ([place])    Gout    Hypercholesteremia    Hypertension    Neuromuscular disorder ([place])    neuropathy  Osteopenia    Retinal detachment    TIA (transient ischemic attack)    Vitamin B12 deficiency     No family history on file. Past Surgical History:  Procedure Laterality Date   25 GAUGE PARS PLANA VITRECTOMY WITH 20 GAUGE MVR PORT Right 03/16/2015   Procedure: 25 GAUGE PARS PLANA VITRECTOMY WITH 23 GAUGE MVR PORT;  Surgeon: Hayden Pedro, MD;  Location: [place];  Service: Ophthalmology;  Laterality: Right;   ANKLE SURGERY     BREAST BIOPSY Right 06/28/2022   [place] RT BREAST BX W LOC DEV 1ST LESION IMG BX SPEC US GUIDE 06/28/2022 [facility]   BREAST EXCISIONAL BIOPSY     BREAST LUMPECTOMY      BREAST LUMPECTOMY WITH RADIOACTIVE SEED LOCALIZATION Right 01/24/2016   Procedure: RIGHT BREAST LUMPECTOMY WITH RADIOACTIVE SEED LOCALIZATION;  Surgeon: Coralie Keens, MD;  Location: [place];  Service: General;  Laterality: Right;   EYE SURGERY     detached retna X2   HEEL SPUR SURGERY  2012   INCISION AND DRAINAGE ABSCESS Right 11/21/2019   Procedure: INCISION AND DRAINAGE FOREHEAD & RIGHT EAR ABSCESS;  Surgeon: Jerrell Belfast, MD;  Location: [facility];  Service: ENT;  Laterality: Right;   INJECTION OF SILICONE OIL Right 61/95/0932   Procedure: Extraction OF SILICONE OIL;  Surgeon: Hayden Pedro, MD;  Location: [facility];  Service: Ophthalmology;  Laterality: Right;   ORIF ANKLE FRACTURE Left 06/17/2014   Procedure: OPEN REDUCTION INTERNAL FIXATION (ORIF) LEFT BIMALLEOLAR ANKLE FRACTURE;  Surgeon: Marianna Payment, MD;  Location: [place];  Service: Orthopedics;  Laterality: Left;   PHOTOCOAGULATION WITH LASER Right 03/16/2015   Procedure: PHOTOCOAGULATION WITH LASER;  Surgeon: Hayden Pedro, MD;  Location: [facility];  Service: Ophthalmology;  Laterality: Right;   SHOULDER ARTHROSCOPY Right    plates and screws   VITRECTOMY Right 03/16/2015   WRIST ARTHROPLASTY Left    plates and screws and bone graft   Social History   Social History Narrative   Not on file   Immunization History  Administered Date(s) Administered   Influenza, High Dose Seasonal PF 03/11/2018   Influenza-Unspecified 03/12/2015   Moderna SARS-COV2 Booster Vaccination 05/11/2020, 11/23/2020   Zoster Recombinat (Shingrix) 06/21/2017, 06/21/2017     Objective: Vital Signs: There were no vitals taken for this visit.   Physical Exam   Musculoskeletal Exam: ***  CDAI Exam: CDAI Score: -- Patient Global: --; Provider Global: -- Swollen: --; Tender: -- Joint Exam 07/05/2022   No joint exam has been documented for this visit   There is currently no information documented on the homunculus. Go to  the Rheumatology activity and complete the homunculus joint exam.  Investigation: No additional findings.  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 This was found to be concordant by Dr. Nolon Nations. Pathology results were discussed with the patient by telephone. The patient reported doing well after the biopsy with tenderness at the site. Post biopsy instructions and care were reviewed and questions were answered. The patient was encouraged to call [facility] for any additional concerns. Per patient request, surgical consultation has been arranged with Dr. Nedra Hai at [facility] on July 11, 2022. Pathology results  reported by Stacie Acres RN on 06/29/2022. Electronically Signed   By: Nolon Nations M.D.   On: 06/29/2022 15:29   Result Date: 06/29/2022 CLINICAL DATA:  Patient presents for ultrasound-guided core biopsy of RIGHT breast mass. EXAM: ULTRASOUND GUIDED RIGHT BREAST CORE NEEDLE BIOPSY COMPARISON:  Previous exam(s). PROCEDURE: I met with the patient and we discussed the procedure of ultrasound-guided biopsy, including benefits and alternatives. We discussed the high likelihood of a successful procedure. We discussed the risks of the procedure, including infection, bleeding, tissue injury, clip migration, and inadequate sampling. Informed written consent was given. The usual time-out protocol was performed immediately prior to the procedure. Lesion quadrant: 9 o'clock RIGHT breast Using sterile technique and 1% Lidocaine as local anesthetic, under direct ultrasound visualization, a 12 gauge spring-loaded device was used to perform biopsy of mass in the 9 o'clock location of the RIGHT breast using a  inferior to superior approach. At the conclusion of the procedure ribbon shaped tissue marker clip was deployed into the biopsy cavity. Follow up 2 view mammogram was performed and dictated separately. IMPRESSION: Ultrasound guided biopsy of RIGHT breast mass. No apparent complications. Electronically Signed: By: Nolon Nations M.D. On: 06/28/2022 14:35  MM CLIP PLACEMENT RIGHT  Result Date: 06/28/2022 CLINICAL DATA:  Status post ultrasound-guided core biopsy of RIGHT breast mass. EXAM: 3D DIAGNOSTIC RIGHT MAMMOGRAM POST ULTRASOUND BIOPSY COMPARISON:  Previous exam(s). FINDINGS: 3D Mammographic images were obtained following ultrasound guided biopsy of mass in the 9 o'clock location of the RIGHT breast and placement of a ribbon shaped clip. The biopsy marking clip is in expected position at the site of biopsy. IMPRESSION: Appropriate positioning of the ribbon shaped biopsy marking clip at the site of biopsy in the RIGHT breast mass. Final Assessment: Post Procedure Mammograms for Marker Placement Electronically Signed   By: Nolon Nations M.D.   On: 06/28/2022 14:38  MM DIAG BREAST TOMO UNI RIGHT  Result Date: 06/28/2022 CLINICAL DATA:  Patient was recalled from screening mammogram for a possible mass in the right breast. EXAM: DIGITAL DIAGNOSTIC UNILATERAL RIGHT MAMMOGRAM WITH TOMOSYNTHESIS; ULTRASOUND RIGHT BREAST LIMITED TECHNIQUE: Right digital diagnostic mammography and breast tomosynthesis was performed.; Targeted ultrasound examination of the right breast was performed COMPARISON:  Previous exam(s). ACR Breast Density Category b: There are scattered areas of fibroglandular density. FINDINGS: Additional imaging of the right breast was performed. There is persistence of a mass in the lateral aspect of the breast. On physical exam, I palpate a discrete mass in the right breast at 9 o'clock 8 cm from the nipple. Targeted ultrasound is performed, showing an irregular hypoechoic mass in the right breast  at 9 o'clock 8 cm from the nipple measuring 1.4 x 0.7 x 1.3 cm. Sonographic evaluation the right axilla does not show any enlarged adenopathy. IMPRESSION: Suspicious 1.4 cm mass in the 9 o'clock region of the right breast 8 cm from the nipple. RECOMMENDATION: Ultrasound-guided core biopsy of the mass in the 9 o'clock region of the right breast is recommended. The biopsy will be scheduled at the patient's convenience. I have discussed the findings and recommendations with the patient. If applicable, a reminder letter will be sent to the patient regarding the next appointment. BI-RADS CATEGORY  4: Suspicious. Electronically Signed   By: Lillia Mountain M.D.   On: 06/28/2022 12:51  US BREAST LTD UNI RIGHT INC AXILLA  Result Date: 06/28/2022 CLINICAL DATA:  Patient was recalled from screening mammogram for a possible mass in the right breast. EXAM: DIGITAL  DIAGNOSTIC UNILATERAL RIGHT MAMMOGRAM WITH TOMOSYNTHESIS; ULTRASOUND RIGHT BREAST LIMITED TECHNIQUE: Right digital diagnostic mammography and breast tomosynthesis was performed.; Targeted ultrasound examination of the right breast was performed COMPARISON:  Previous exam(s). ACR Breast Density Category b: There are scattered areas of fibroglandular density. FINDINGS: Additional imaging of the right breast was performed. There is persistence of a mass in the lateral aspect of the breast. On physical exam, I palpate a discrete mass in the right breast at 9 o'clock 8 cm from the nipple. Targeted ultrasound is performed, showing an irregular hypoechoic mass in the right breast at 9 o'clock 8 cm from the nipple measuring 1.4 x 0.7 x 1.3 cm. Sonographic evaluation the right axilla does not show any enlarged adenopathy. IMPRESSION: Suspicious 1.4 cm mass in the 9 o'clock region of the right breast 8 cm from the nipple. RECOMMENDATION: Ultrasound-guided core biopsy of the mass in the 9 o'clock region of the right breast is recommended. The biopsy will be scheduled at the  patient's convenience. I have discussed the findings and recommendations with the patient. If applicable, a reminder letter will be sent to the patient regarding the next appointment. BI-RADS CATEGORY  4: Suspicious. Electronically Signed   By: Lillia Mountain M.D.   On: 06/28/2022 12:51   Recent Labs: Lab Results  Component Value Date   WBC 10.4 03/22/2021   HGB 11.1 (L) 03/22/2021   PLT 393 03/22/2021   NA 139 03/22/2021   K 4.5 03/22/2021   CL 96 (L) 03/22/2021   CO2 35 (H) 03/22/2021   GLUCOSE 137 (H) 03/22/2021   BUN 16 03/22/2021   CREATININE 1.15 (H) 03/22/2021   BILITOT 0.5 03/22/2021   ALKPHOS 64 03/22/2021   AST 20 03/22/2021   ALT 17 03/22/2021   PROT 5.6 (L) 03/22/2021   ALBUMIN 2.9 (L) 03/22/2021   CALCIUM 8.3 (L) 03/22/2021   GFRAA 47 (L) 11/25/2019    Speciality Comments: No specialty comments available.  Procedures:  No procedures performed Allergies: Ramipril, Other, Cortisone, Niacin, Niacin and related, and Valsartan   Assessment / Plan:     Visit Diagnoses: No diagnosis found.  ***  Orders: No orders of the defined types were placed in this encounter.  No orders of the defined types were placed in this encounter.    Follow-Up Instructions: No follow-ups on file.   Collier [place], MD  Note - This record has been created using [employer].  Chart creation errors have been sought, but may not always  have been located. Such creation errors do not reflect on  the standard of medical care.

## 2022-07-05 ENCOUNTER — Encounter: Payer: Self-pay | Admitting: Internal Medicine

## 2022-07-05 ENCOUNTER — Ambulatory Visit: Payer: 59 | Attending: Internal Medicine | Admitting: Internal Medicine

## 2022-07-05 ENCOUNTER — Ambulatory Visit (INDEPENDENT_AMBULATORY_CARE_PROVIDER_SITE_OTHER): Payer: 59

## 2022-07-05 ENCOUNTER — Ambulatory Visit: Payer: 59

## 2022-07-05 VITALS — BP 99/66 | HR 97 | Resp 12 | Ht 62.5 in | Wt 134.0 lb

## 2022-07-05 DIAGNOSIS — M79642 Pain in left hand: Secondary | ICD-10-CM | POA: Diagnosis not present

## 2022-07-05 DIAGNOSIS — M1A9XX1 Chronic gout, unspecified, with tophus (tophi): Secondary | ICD-10-CM

## 2022-07-05 DIAGNOSIS — M79641 Pain in right hand: Secondary | ICD-10-CM

## 2022-07-05 DIAGNOSIS — N183 Chronic kidney disease, stage 3 unspecified: Secondary | ICD-10-CM | POA: Diagnosis not present

## 2022-07-05 DIAGNOSIS — M2540 Effusion, unspecified joint: Secondary | ICD-10-CM | POA: Diagnosis not present

## 2022-07-05 DIAGNOSIS — M1000 Idiopathic gout, unspecified site: Secondary | ICD-10-CM | POA: Diagnosis not present

## 2022-07-05 DIAGNOSIS — N189 Chronic kidney disease, unspecified: Secondary | ICD-10-CM | POA: Insufficient documentation

## 2022-07-06 LAB — SEDIMENTATION RATE: Sed Rate: 25 mm/h (ref 0–30)

## 2022-07-06 LAB — CBC WITH DIFFERENTIAL/PLATELET
Absolute Monocytes: 722 cells/uL (ref 200–950)
Basophils Absolute: 26 cells/uL (ref 0–200)
Basophils Relative: 0.3 %
Eosinophils Absolute: 299 cells/uL (ref 15–500)
Eosinophils Relative: 3.4 %
HCT: 39.6 % (ref 35.0–45.0)
Hemoglobin: 13.3 g/dL (ref 11.7–15.5)
Lymphs Abs: 1681 cells/uL (ref 850–3900)
MCH: 33.3 pg — ABNORMAL HIGH (ref 27.0–33.0)
MCHC: 33.6 g/dL (ref 32.0–36.0)
MCV: 99 fL (ref 80.0–100.0)
MPV: 10.4 fL (ref 7.5–12.5)
Monocytes Relative: 8.2 %
Neutro Abs: 6072 cells/uL (ref 1500–7800)
Neutrophils Relative %: 69 %
Platelets: 276 10*3/uL (ref 140–400)
RBC: 4 10*6/uL (ref 3.80–5.10)
RDW: 11.5 % (ref 11.0–15.0)
Total Lymphocyte: 19.1 %
WBC: 8.8 10*3/uL (ref 3.8–10.8)

## 2022-07-06 LAB — URIC ACID: Uric Acid, Serum: 5.3 mg/dL (ref 2.5–7.0)

## 2022-07-07 NOTE — Progress Notes (Signed)
Lab results all look good her uric acid is 5.3 so the febuxostat 40 mg is working well. Her xrays show the known erosion in her right thumb but other areas all look like osteoarthritis. I don't have any specific additional medication to recommend for this, we could refer her to occupational therapy about the difficulty using her right hand if she is interested.

## 2022-07-10 ENCOUNTER — Encounter: Payer: Self-pay | Admitting: Physical Medicine and Rehabilitation

## 2022-07-10 ENCOUNTER — Encounter: Payer: 59 | Attending: Physical Medicine and Rehabilitation | Admitting: Physical Medicine and Rehabilitation

## 2022-07-10 VITALS — BP 137/80 | HR 87 | Ht 62.5 in | Wt 138.0 lb

## 2022-07-10 DIAGNOSIS — R269 Unspecified abnormalities of gait and mobility: Secondary | ICD-10-CM | POA: Diagnosis not present

## 2022-07-10 DIAGNOSIS — M48062 Spinal stenosis, lumbar region with neurogenic claudication: Secondary | ICD-10-CM | POA: Insufficient documentation

## 2022-07-10 DIAGNOSIS — M4800 Spinal stenosis, site unspecified: Secondary | ICD-10-CM | POA: Insufficient documentation

## 2022-07-10 MED ORDER — HYDROCODONE-ACETAMINOPHEN 5-325 MG PO TABS: 1.0000 | ORAL_TABLET | Freq: Four times a day (QID) | ORAL | 0 refills | Status: DC | PRN

## 2022-07-10 NOTE — Progress Notes (Signed)
Subjective:    Patient ID: Sabrina Baker, female    DOB: 05-29-1941, 82 y.o.   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  Hx of Charcot feet B/L Here for f/u for chronic pain.    S/P R SI joint injection by Dr Letta Pate 06/06/22 Seems ot have helped some.   Oldest sister died after 90th bday- funeral  Ex husband also died in 06-14-2023-   Takes 2-5 pills/day. Norco- takes care of most of pain, but taking the way she  "was taught". Which is mor epills than she was written for.     Takes gabapentin 300 mg QHS.  Duloxetine 60 mg daily. Taken for a long time.   Occ has gout flare.   Has been working well the way things are going.   Thought L ankle was swelling- and R calf swelling Takes Lasix daily.   R calf is more sore than L calf right now.    Pain Inventory Average Pain 5 Pain Right Now 3 My pain is aching  In the last 24 hours, has pain interfered with the following? General activity 0 Relation with others 6 Enjoyment of life 0 What TIME of day is your pain at its worst? daytime and evening Sleep (in general) Good  Pain is worse with: some activites Pain improves with: rest, pacing activities, and medication Relief from Meds: 5  No family history on file. Social History   Socioeconomic History   Marital status: Divorced    Spouse name: Not on file   Number of children: Not on file   Years of education: Not on file   Highest education level: Not on file  Occupational History   Not on file  Tobacco Use   Smoking status: Never   Smokeless tobacco: Never  Vaping Use   Vaping Use: Never used  Substance and Sexual Activity   Alcohol use: No   Drug use: No   Sexual activity: Never    Birth control/protection: Post-menopausal  Other Topics Concern   Not on file   Social History Narrative   Not on file   Social Determinants of Health   Financial Resource Strain: Not on file  Food Insecurity: Not on file  Transportation Needs: Not on file  Physical Activity: Not on file  Stress: Not on file  Social Connections: Not on file   Past Surgical History:  Procedure Laterality Date   25 GAUGE PARS PLANA VITRECTOMY WITH 20 GAUGE MVR PORT Right 03/16/2015   Procedure: 25 GAUGE PARS PLANA VITRECTOMY WITH 23 GAUGE MVR PORT;  Surgeon: Hayden Pedro, MD;  Location: [place];  Service: Ophthalmology;  Laterality: Right;   ANKLE SURGERY     BREAST BIOPSY Right 06/28/2022   [place] RT BREAST BX W LOC DEV 1ST LESION IMG BX SPEC US GUIDE 06/28/2022 [facility]   BREAST EXCISIONAL BIOPSY     BREAST LUMPECTOMY     BREAST LUMPECTOMY WITH RADIOACTIVE SEED LOCALIZATION Right 01/24/2016   Procedure: RIGHT BREAST LUMPECTOMY WITH RADIOACTIVE SEED LOCALIZATION;  Surgeon: Coralie Keens, MD;  Location: [place];  Service: General;  Laterality: Right;   EYE SURGERY     detached retna X2   HEEL SPUR SURGERY  2012   INCISION AND DRAINAGE ABSCESS Right  11/21/2019   Procedure: INCISION AND DRAINAGE FOREHEAD & RIGHT EAR ABSCESS;  Surgeon: Jerrell Belfast, MD;  Location: [place];  Service: ENT;  Laterality: Right;   INJECTION OF SILICONE OIL Right 65/99/3570   Procedure: Extraction OF SILICONE OIL;  Surgeon: Hayden Pedro, MD;  Location: [place];  Service: Ophthalmology;  Laterality: Right;   ORIF ANKLE FRACTURE Left 06/17/2014   Procedure: OPEN REDUCTION INTERNAL FIXATION (ORIF) LEFT BIMALLEOLAR ANKLE FRACTURE;  Surgeon: Marianna Payment, MD;  Location: [place];  Service: Orthopedics;  Laterality: Left;   PHOTOCOAGULATION WITH LASER Right 03/16/2015   Procedure: PHOTOCOAGULATION WITH LASER;  Surgeon: Hayden Pedro, MD;  Location: [place];  Service: Ophthalmology;  Laterality: Right;   SHOULDER ARTHROSCOPY Right    plates and screws   VITRECTOMY Right  03/16/2015   WRIST ARTHROPLASTY Left    plates and screws and bone graft   Past Surgical History:  Procedure Laterality Date   25 GAUGE PARS PLANA VITRECTOMY WITH 20 GAUGE MVR PORT Right 03/16/2015   Procedure: 25 GAUGE PARS PLANA VITRECTOMY WITH 23 GAUGE MVR PORT;  Surgeon: Hayden Pedro, MD;  Location: [place];  Service: Ophthalmology;  Laterality: Right;   ANKLE SURGERY     BREAST BIOPSY Right 06/28/2022   [place] RT BREAST BX W LOC DEV 1ST LESION IMG BX SPEC US GUIDE 06/28/2022 [facility]   BREAST EXCISIONAL BIOPSY     BREAST LUMPECTOMY     BREAST LUMPECTOMY WITH RADIOACTIVE SEED LOCALIZATION Right 01/24/2016   Procedure: RIGHT BREAST LUMPECTOMY WITH RADIOACTIVE SEED LOCALIZATION;  Surgeon: Coralie Keens, MD;  Location: [place];  Service: General;  Laterality: Right;   EYE SURGERY     detached retna X2   HEEL SPUR SURGERY  2012   INCISION AND DRAINAGE ABSCESS Right 11/21/2019   Procedure: INCISION AND DRAINAGE FOREHEAD & RIGHT EAR ABSCESS;  Surgeon: Jerrell Belfast, MD;  Location: [place];  Service: ENT;  Laterality: Right;   INJECTION OF SILICONE OIL Right 17/79/3903   Procedure: Extraction OF SILICONE OIL;  Surgeon: Hayden Pedro, MD;  Location: [place];  Service: Ophthalmology;  Laterality: Right;   ORIF ANKLE FRACTURE Left 06/17/2014   Procedure: OPEN REDUCTION INTERNAL FIXATION (ORIF) LEFT BIMALLEOLAR ANKLE FRACTURE;  Surgeon: Marianna Payment, MD;  Location: [place];  Service: Orthopedics;  Laterality: Left;   PHOTOCOAGULATION WITH LASER Right 03/16/2015   Procedure: PHOTOCOAGULATION WITH LASER;  Surgeon: Hayden Pedro, MD;  Location: [place];  Service: Ophthalmology;  Laterality: Right;   SHOULDER ARTHROSCOPY Right    plates and screws   VITRECTOMY Right 03/16/2015   WRIST ARTHROPLASTY Left    plates and screws and bone graft   Past Medical History:  Diagnosis Date   Acute diastolic congestive heart failure ([place])    Carotid stenosis    Carotid US  1/22: Bilateral ICA 1-39; follow-up as needed   Chronic kidney disease    stage 3   Diabetes mellitus without complication (HCC)    Gout    Hypercholesteremia    Hypertension    Neuromuscular disorder (HCC)    neuropathy   Osteopenia    Retinal detachment    TIA (transient ischemic attack)    Vitamin B12 deficiency    BP 137/80   Pulse 87   Ht 5' 2.5" (1.588 m)   Wt 138 lb (62.6 kg)   BMI 24.84 kg/m   Opioid Risk Score:   Fall Risk Score:  `1  Depression screen  PHQ 2/9     06/06/2022   12:30 PM 04/05/2022   11:06 AM 12/01/2019    1:51 PM  Depression screen PHQ 2/9  Decreased Interest 1 1 0  Down, Depressed, Hopeless 1 1 0  PHQ - 2 Score 2 2 0  Altered sleeping  1   Tired, decreased energy  1   Change in appetite  0   Feeling bad or failure about yourself   0   Trouble concentrating  0   Moving slowly or fidgety/restless  0   Suicidal thoughts  0   PHQ-9 Score  4   Difficult doing work/chores  Somewhat difficult      Review of Systems  Musculoskeletal:  Positive for back pain.       Right shoulder/arm pain Right leg pain Bilateral ankle pain  All other systems reviewed and are negative.     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  Hx of Charcot feet B/L Here for f/u on chronic pain  Educated pt on lumbar stenosis.   2. Educated pt that will con't Norco 5/325 mg 3x/day as needed- with an extra 10 tabs as needed for bad days. So 100 tabs /month max. Educated pt cannot refill early.  Sent in refill for next month.    3. Con't gabapentin 300 mg QHS- for nerve pain.  Has refills.    4.  Will arrange for SI joint injections with Dr  Letta Pate- in 42month- prefer B/L per pt.   5. Will need UDS at next appointment.   6. F/U in 3 months with me.   I spent a total of 20   minutes on total care today- >50% coordination of care- due to discussion/education on pain meds- and lumbar stenosis- and went over independent review of MRI this year and 2022.

## 2022-07-10 NOTE — Patient Instructions (Signed)
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  Hx of Charcot feet B/L Here for f/u on chronic pain  Educated pt on lumbar stenosis.   2. Educated pt that will con't Norco 5/325 mg 3x/day as needed- with an extra 10 tabs as needed for bad days. So 100 tabs /month max. Educated pt cannot refill early.  Sent in refill for next month.    3. Con't gabapentin 300 mg QHS- for nerve pain.  Has refills.    4.  Will arrange for SI joint injections with Dr Letta Pate- in 68month- prefer B/L per pt.   5. Will need UDS at next appointment.   6. F/U in 3 months with me.

## 2022-07-11 ENCOUNTER — Telehealth: Payer: Self-pay

## 2022-07-11 ENCOUNTER — Other Ambulatory Visit: Payer: Self-pay | Admitting: Surgery

## 2022-07-11 DIAGNOSIS — Z853 Personal history of malignant neoplasm of breast: Secondary | ICD-10-CM

## 2022-07-11 DIAGNOSIS — C50911 Malignant neoplasm of unspecified site of right female breast: Secondary | ICD-10-CM | POA: Diagnosis not present

## 2022-07-11 MED ORDER — APIXABAN 2.5 MG PO TABS: 2.5000 mg | ORAL_TABLET | Freq: Two times a day (BID) | ORAL | 1 refills | Status: DC

## 2022-07-11 NOTE — Telephone Encounter (Signed)
Patient with diagnosis of afib on Eliquis for anticoagulation.    Procedure: RIGHT BREAST LUMPECTOMY SURGERY  Date of procedure: TBD   CHA2DS2-VASc Score = 8   This indicates a 10.8% annual risk of stroke. The patient's score is based upon: CHF History: 1 HTN History: 1 Diabetes History: 1 Stroke History: 2 Vascular Disease History: 0 Age Score: 2 Gender Score: 1      CrCl 26 ml/min  While doing patient's clearance, I noticed that her scr has been >1.5 since May. Therefore since she is >80 she needs a dose reduction. I called pt and reviewed the dose reduction and Rx sent to pharmacy.  Per office protocol, patient can hold Eliquis for 3 days prior to procedure.    Patient is at higher risk due to her possible TIA in 2013, however with her kidney function, I believe a 3 day hold would be acceptable.   **This guidance is not considered finalized until pre-operative APP has relayed final recommendations.**

## 2022-07-11 NOTE — Telephone Encounter (Signed)
Primary Cardiologist:Philip Nahser, MD   Preoperative team, please contact this patient and set up a phone call appointment for further preoperative risk assessment. Please obtain consent and complete medication review. Thank you for your help.   I confirm that guidance regarding antiplatelet and oral anticoagulation therapy has been completed and, if necessary, noted below.   Emmaline Life, NP-C  07/11/2022, 3:07 PM [address] Office [phone] Fax [phone]

## 2022-07-11 NOTE — Telephone Encounter (Signed)
..  Pre-operative Risk Assessment    Patient Name: Sabrina Baker  DOB: Oct 31, 1940 MRN: [id number]      Request for Surgical Clearance    Procedure:   RIGHT BREAST LUMPECTOMY SURGERY  Date of Surgery:  Clearance TBD                                 Surgeon:  Evlyn Courier Surgeon's Group or Practice Name:  [facility] Phone number:  [phone] Fax number:  [phone]   Type of Clearance Requested:   - Medical  - Pharmacy:  Hold Apixaban (Eliquis)     Type of Anesthesia:   LMA   Additional requests/questions:    Gwenlyn Found   07/11/2022, 1:11 PM

## 2022-07-12 ENCOUNTER — Telehealth: Payer: Self-pay | Admitting: Radiation Oncology

## 2022-07-12 ENCOUNTER — Telehealth: Payer: Self-pay | Admitting: *Deleted

## 2022-07-12 ENCOUNTER — Telehealth: Payer: Self-pay | Admitting: Hematology and Oncology

## 2022-07-12 DIAGNOSIS — G8929 Other chronic pain: Secondary | ICD-10-CM | POA: Diagnosis not present

## 2022-07-12 DIAGNOSIS — I129 Hypertensive chronic kidney disease with stage 1 through stage 4 chronic kidney disease, or unspecified chronic kidney disease: Secondary | ICD-10-CM | POA: Diagnosis not present

## 2022-07-12 DIAGNOSIS — E785 Hyperlipidemia, unspecified: Secondary | ICD-10-CM | POA: Diagnosis not present

## 2022-07-12 DIAGNOSIS — K219 Gastro-esophageal reflux disease without esophagitis: Secondary | ICD-10-CM | POA: Diagnosis not present

## 2022-07-12 DIAGNOSIS — I10 Essential (primary) hypertension: Secondary | ICD-10-CM | POA: Diagnosis not present

## 2022-07-12 DIAGNOSIS — E08610 Diabetes mellitus due to underlying condition with diabetic neuropathic arthropathy: Secondary | ICD-10-CM | POA: Diagnosis not present

## 2022-07-12 DIAGNOSIS — I5033 Acute on chronic diastolic (congestive) heart failure: Secondary | ICD-10-CM | POA: Diagnosis not present

## 2022-07-12 NOTE — Telephone Encounter (Signed)
Left message for patient to call back to schedule consult per 1/30 referral.

## 2022-07-12 NOTE — Progress Notes (Signed)
New Breast Cancer Diagnosis: Right Breast   Did patient present with symptoms (if so, please note symptoms) or screening mammography?:Screening Mass    Location and Extent of disease :right breast. Located at 9 o'clock position, measured 1.4 cm in greatest dimension. Adenopathy no.  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 Ninfa Linden 07/11/2022 -From a surgical standpoint we did discussed a radioactive seed guided lumpectomy versus mastectomy and the long-term results of each. She is interested in breast conservation.  - Ambulatory Referral to Oncology-Medical - Ambulatory Referral to Radiation Oncology  -Right Lumpectomy 2017- Fibrocystic changes   Medical oncologist, treatment if any:   Dr. Lindi Adie 07/17/2022   Family History of Breast/Ovarian/Prostate Cancer: None  Lymphedema issues, if any:  No    Pain issues, if any: No    SAFETY ISSUES: Prior radiation? No Pacemaker/ICD? No Possible current pregnancy? Postmenopausal Is the patient on methotrexate? No  Current Complaints / other details:

## 2022-07-12 NOTE — Telephone Encounter (Signed)
Pt has been scheduled for a tele visit 07/18/22.  Consent on file / medications reconciled.    Patient Consent for Virtual Visit        Sabrina Baker has provided verbal consent on 07/12/2022 for a virtual visit (video or telephone).   CONSENT FOR VIRTUAL VISIT FOR:  Sabrina Baker  By participating in this virtual visit I agree to the following:  I hereby voluntarily request, consent and authorize [facility] and its employed or contracted physicians, physician assistants, nurse practitioners or other licensed health care professionals (the Practitioner), to provide me with telemedicine health care services (the "Services") as deemed necessary by the treating Practitioner. I acknowledge and consent to receive the Services by the Practitioner via telemedicine. I understand that the telemedicine visit will involve communicating with the Practitioner through live audiovisual communication technology and the disclosure of certain medical information by electronic transmission. I acknowledge that I have been given the opportunity to request an in-person assessment or other available alternative prior to the telemedicine visit and am voluntarily participating in the telemedicine visit.  I understand that I have the right to withhold or withdraw my consent to the use of telemedicine in the course of my care at any time, without affecting my right to future care or treatment, and that the Practitioner or I may terminate the telemedicine visit at any time. I understand that I have the right to inspect all information obtained and/or recorded in the course of the telemedicine visit and may receive copies of available information for a reasonable fee.  I understand that some of the potential risks of receiving the Services via telemedicine include:  Delay or interruption in medical evaluation due to technological equipment failure or disruption; Information transmitted may not be sufficient (e.g.  poor resolution of images) to allow for appropriate medical decision making by the Practitioner; and/or  In rare instances, security protocols could fail, causing a breach of personal health information.  Furthermore, I acknowledge that it is my responsibility to provide information about my medical history, conditions and care that is complete and accurate to the best of my ability. I acknowledge that Practitioner's advice, recommendations, and/or decision may be based on factors not within their control, such as incomplete or inaccurate data provided by me or distortions of diagnostic images or specimens that may result from electronic transmissions. I understand that the practice of medicine is not an exact science and that Practitioner makes no warranties or guarantees regarding treatment outcomes. I acknowledge that a copy of this consent can be made available to me via my patient portal ([facility]), or I can request a printed copy by calling the office of [facility].    I understand that my insurance will be billed for this visit.   I have read or had this consent read to me. I understand the contents of this consent, which adequately explains the benefits and risks of the Services being provided via telemedicine.  I have been provided ample opportunity to ask questions regarding this consent and the Services and have had my questions answered to my satisfaction. I give my informed consent for the services to be provided through the use of telemedicine in my medical care

## 2022-07-12 NOTE — Telephone Encounter (Signed)
Pt has been scheduled a tele visit, 07/18/22. Consent on file / medications reconciled.

## 2022-07-12 NOTE — Telephone Encounter (Signed)
Scheduled appt per 1/30 referral. Pt is aware of appt date and time. Pt is aware to arrive 15 mins prior to appt time and to bring and updated insurance card. Pt is aware of appt location.

## 2022-07-13 ENCOUNTER — Ambulatory Visit
Admission: RE | Admit: 2022-07-13 | Discharge: 2022-07-13 | Disposition: A | Discharge status: Home / Self Care | Payer: 59 | Source: Ambulatory Visit | Attending: Radiation Oncology | Admitting: Radiation Oncology

## 2022-07-13 ENCOUNTER — Encounter: Payer: Self-pay | Admitting: Radiation Oncology

## 2022-07-13 DIAGNOSIS — C50411 Malignant neoplasm of upper-outer quadrant of right female breast: Secondary | ICD-10-CM | POA: Diagnosis not present

## 2022-07-13 DIAGNOSIS — Z170 Estrogen receptor positive status [ER+]: Secondary | ICD-10-CM | POA: Diagnosis not present

## 2022-07-13 NOTE — Addendum Note (Signed)
Encounter addended by: Hayden Pedro, PA-C on: 07/13/2022 2:15 PM  Actions taken: Level of Service modified

## 2022-07-13 NOTE — Progress Notes (Signed)
Radiation Oncology         [phone] ________________________________  Initial Outpatient Consultation - Conducted via telephone at patient request.  I spoke with the patient to conduct this consult visit via telephone. The patient was notified in advance and was offered an in person or telemedicine meeting to allow for face to face communication but instead preferred to proceed with a telephone consult.     Name: Sabrina Baker        MRN: [id number]  Date of Service: 07/13/2022 DOB: [birth year]  CC:Sabrina Stains, MD  Sabrina Keens, MD     REFERRING PHYSICIAN: Coralie Keens, MD   DIAGNOSIS: The encounter diagnosis was Primary malignant neoplasm of upper outer quadrant of right breast ([place]).   HISTORY OF PRESENT ILLNESS: Sabrina Baker is a 82 y.o. female seen at the request of Dr. Ninfa Baker for a diagnosis of right breast cancer.  Also had a lesion on the right breast underwent excision in 2017 which showed benign changes.  She presented with screening detected mass more recently on bilateral diagnostic ultrasound this was found at the 9 o'clock position measuring 1.4 cm.  Her axilla is negative for adenopathy. 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  Her cancer was ER/PR positive, HER2 negative with a Ki-67 of 25%.  She is getting scheduled to undergo right lumpectomy with Dr. Ninfa Baker and is seen to discuss options of adjuvant treatment.   PREVIOUS RADIATION THERAPY: No   PAST MEDICAL HISTORY:  Past Medical History:  Diagnosis Date   Acute diastolic congestive heart failure ([place])    Carotid stenosis    Carotid US 1/22: Bilateral ICA 1-39; follow-up as needed   Chronic kidney disease    stage 3   Diabetes mellitus without complication (HCC)    Gout    Hypercholesteremia    Hypertension    Neuromuscular disorder (HCC)    neuropathy   Osteopenia    Retinal detachment    TIA (transient ischemic attack)     Vitamin B12 deficiency        PAST SURGICAL HISTORY: Past Surgical History:  Procedure Laterality Date   25 GAUGE PARS PLANA VITRECTOMY WITH 20 GAUGE MVR PORT Right 03/16/2015   Procedure: 25 GAUGE PARS PLANA VITRECTOMY WITH 23 GAUGE MVR PORT;  Surgeon: Hayden Pedro, MD;  Location: [facility];  Service: Ophthalmology;  Laterality: Right;   ANKLE SURGERY     BREAST BIOPSY Right 06/28/2022   [place] RT BREAST BX W LOC DEV 1ST LESION IMG BX SPEC US GUIDE 06/28/2022 [facility] MAMMOGRAPHY   BREAST EXCISIONAL BIOPSY     BREAST LUMPECTOMY     BREAST LUMPECTOMY WITH RADIOACTIVE SEED LOCALIZATION Right 01/24/2016   Procedure: RIGHT BREAST LUMPECTOMY WITH RADIOACTIVE SEED LOCALIZATION;  Surgeon: Sabrina Keens, MD;  Location: [facility];  Service: General;  Laterality: Right;   EYE SURGERY     detached retna X2   HEEL SPUR SURGERY  2012   INCISION AND DRAINAGE ABSCESS Right 11/21/2019   Procedure: INCISION AND DRAINAGE FOREHEAD & RIGHT EAR ABSCESS;  Surgeon: Jerrell Belfast, MD;  Location: [facility];  Service: ENT;  Laterality: Right;   INJECTION OF SILICONE OIL Right 25/10/3974   Procedure: Extraction OF SILICONE OIL;  Surgeon: Hayden Pedro, MD;  Location: [facility];  Service: Ophthalmology;  Laterality: Right;   ORIF ANKLE FRACTURE Left 06/17/2014   Procedure: OPEN REDUCTION INTERNAL FIXATION (ORIF) LEFT BIMALLEOLAR ANKLE FRACTURE;  Surgeon:  Naiping Eduard Roux, MD;  Location: [facility];  Service: Orthopedics;  Laterality: Left;   PHOTOCOAGULATION WITH LASER Right 03/16/2015   Procedure: PHOTOCOAGULATION WITH LASER;  Surgeon: Hayden Pedro, MD;  Location: [facility];  Service: Ophthalmology;  Laterality: Right;   SHOULDER ARTHROSCOPY Right    plates and screws   VITRECTOMY Right 03/16/2015   WRIST ARTHROPLASTY Left    plates and screws and bone graft     FAMILY HISTORY: History reviewed. No pertinent family history.   SOCIAL HISTORY:  reports that she has never smoked. She has never used  smokeless tobacco. She reports that she does not drink alcohol and does not use drugs.  The patient is divorced and lives in [place], [place]. She has an adult daughter who helps her.   ALLERGIES: Ramipril, Other, Cortisone, Niacin, Niacin and related, and Valsartan   MEDICATIONS:  Current Outpatient Medications  Medication Sig Dispense Refill   acetaminophen (TYLENOL) 650 MG CR tablet Take 1,300 mg by mouth every 8 (eight) hours as needed for pain.     apixaban (ELIQUIS) 2.5 MG TABS tablet Take 1 tablet (2.5 mg total) by mouth 2 (two) times daily. 180 tablet 1   atorvastatin (LIPITOR) 10 MG tablet Take 10 mg by mouth daily.     bevacizumab (AVASTIN) 400 MG/16ML SOLN Inject into the vein.     Blood Glucose Monitoring Suppl (GLUCOCOM BLOOD GLUCOSE MONITOR) DEVI Use to test blood sugar 3 times a day (E11.65)     brimonidine (ALPHAGAN) 0.2 % ophthalmic solution Place 1 drop into the left eye 2 (two) times daily.     calcitRIOL (ROCALTROL) 0.25 MCG capsule Take 0.25 mcg by mouth 3 (three) times a week. Mon, Wed, Friday     calcium-vitamin D (OSCAL WITH D) 500-200 MG-UNIT TABS tablet Take 1 tablet by mouth daily.      carvedilol (COREG) 25 MG tablet Take 25 mg by mouth 2 (two) times daily with a meal.     diclofenac Sodium (VOLTAREN) 1 % GEL Apply topically 4 (four) times daily.     DULoxetine (CYMBALTA) 60 MG capsule Take 60 mg by mouth daily.     febuxostat (ULORIC) 40 MG tablet Take 40 mg by mouth daily. One tab a day     Ferrous Sulfate (IRON) 325 (65 FE) MG TABS Take 1 tablet by mouth 2 (two) times daily.     gabapentin (NEURONTIN) 300 MG capsule Take 1 capsule (300 mg total) by mouth 2 (two) times daily. Started with 300 mg nightly x 1 week, then increase to 300 mg BID or 60 '0mg'$  QHS- for nerve pain 60 capsule 5   HYDROcodone-acetaminophen (NORCO/VICODIN) 5-325 MG tablet Take 1 tablet by mouth every 6 (six) hours as needed for moderate pain. Will increase back to 100 tabs max/month- for  chronic pain 100 tablet 0   insulin glargine, 2 Unit Dial, (TOUJEO MAX SOLOSTAR) 300 UNIT/ML Solostar Pen Inject 10 Units into the skin daily. (Patient taking differently: Inject 18 Units into the skin daily.)     Insulin Pen Needle 32G X 4 MM MISC USE TO INJECT INSULIN ONCE A DAY     ketoconazole (NIZORAL) 2 % shampoo Apply 1 application topically 2 (two) times a week.      Lancets 30G MISC Use to test blood sugars 3 times a day (Dx: E11.65)     nateglinide (STARLIX) 120 MG tablet Take 60 mg by mouth 3 (three) times daily as needed. If CBG is above  [place] test strip USE AS DIRECTED. TESTING FREQUENCY  3 X/DAILY (DX E11.65)     pantoprazole (PROTONIX) 40 MG tablet 1 tablet Orally twice a day Dx: K21.9 for 90 days     triamcinolone cream (KENALOG) 0.1 % Apply 1 application topically 2 (two) times daily as needed (For rash).      trimethoprim-polymyxin b (POLYTRIM) ophthalmic solution Use 4 times in left eye the day of injection, 4 drop the following day.     TRULICITY 1.5 HR/4.1UL SOPN Inject 3 mg into the skin every 7 (seven) days. Friday     furosemide (LASIX) 40 MG tablet Take 1 tablet (40 mg total) by mouth daily. 30 tablet 0   mupirocin cream (BACTROBAN) 2 % Apply topically 2 (two) times daily. Apply to forehead wound until healed (Patient not taking: Reported on 07/13/2022) 15 g 0   No current facility-administered medications for this encounter.     REVIEW OF SYSTEMS: On review of systems, the patient reports that she is doing well overall but uses a rollerator or cane to get around. She denies any breast specific concerns but is reliant on transportation, and physically needs a lot of help to get to appointments per report.     PHYSICAL EXAM:  Unable to assess due to encounter type.    ECOG = 1  0 - Asymptomatic (Fully active, able to carry on all predisease activities without restriction)  1 - Symptomatic but completely ambulatory (Restricted in physically  strenuous activity but ambulatory and able to carry out work of a light or sedentary nature. For example, light housework, office work)  2 - Symptomatic, <50% in bed during the day (Ambulatory and capable of all self care but unable to carry out any work activities. Up and about more than 50% of waking hours)  3 - Symptomatic, >50% in bed, but not bedbound (Capable of only limited self-care, confined to bed or chair 50% or more of waking hours)  4 - Bedbound (Completely disabled. Cannot carry on any self-care. Totally confined to bed or chair)  5 - Death   Eustace Pen MM, Creech RH, Tormey DC, et al. [phone]). "Toxicity and response criteria of the [facility]". [place] Oncol. 5 (6): 649-55    LABORATORY DATA:  Lab Results  Component Value Date   WBC 8.8 07/05/2022   HGB 13.3 07/05/2022   HCT 39.6 07/05/2022   MCV 99.0 07/05/2022   PLT 276 07/05/2022   Lab Results  Component Value Date   NA 139 03/22/2021   K 4.5 03/22/2021   CL 96 (L) 03/22/2021   CO2 35 (H) 03/22/2021   Lab Results  Component Value Date   ALT 17 03/22/2021   AST 20 03/22/2021   ALKPHOS 64 03/22/2021   BILITOT 0.5 03/22/2021      RADIOGRAPHY: XR Hand 2 View Left  Result Date: 07/05/2022 X-ray left hand 2 views AP and lateral Radiocarpal joint space appears normal there is a ossicle in the ulnocarpal space.  Mild degenerative appearing changes in the first [facility] joint but with subluxation present.  MCP joint spaces appear well-preserved some asymmetry or narrowing at the metacarpal head but with no erosive or osteophyte changes.  Widening of the third proximal phalanx consistent with remote healed fracture.  Mild asymmetric joint space narrowing at DIP joints and cystic change in PIPs.  No definite erosions.  Bone mineralization consistent with generalized osteopenia. Impression Demineralization consistent with longstanding  inflammatory arthritis, degenerative changes with severe at the first  [facility] joint moderate in distal finger joints  XR Hand 2 View Right  Result Date: 07/05/2022 X-ray right hand 2 views Radiocarpal joint space appears normal.  There is cystic changes throughout carpal bones with moderate degenerative arthritis of the first [facility] joint no significant subluxation.  First MCP with cystic changes and well-demarcated periarticular erosion with joint space relatively preserved.  IP joint with extensive erosive disease.  There is mild subluxation of the second MCP joint with no erosions or osteophyte process.  Mild asymmetric DIP joint space narrowing.  Bone mineralization consistent with generalized osteopenia. 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 This was found to be concordant by Dr. Nolon Nations. Pathology results were discussed with the patient by telephone. The patient reported doing well after the biopsy with tenderness at the site. Post biopsy instructions and care were reviewed and questions were answered. The patient was encouraged to call [facility] for any additional concerns. Per patient request, surgical consultation has been arranged with Dr. Nedra Hai at [facility] on July 11, 2022. Pathology results reported by Stacie Acres RN on 06/29/2022. Electronically Signed   By: Nolon Nations M.D.   On: 06/29/2022 15:29   Result Date: 06/29/2022 CLINICAL DATA:  Patient presents for ultrasound-guided core biopsy of RIGHT breast mass. EXAM: ULTRASOUND GUIDED RIGHT BREAST CORE NEEDLE BIOPSY COMPARISON:  Previous exam(s). PROCEDURE: I  met with the patient and we discussed the procedure of ultrasound-guided biopsy, including benefits and alternatives. We discussed the high likelihood of a successful procedure. We discussed the risks of the procedure, including infection, bleeding, tissue injury, clip migration, and inadequate sampling. Informed written consent was given. The usual time-out protocol was performed immediately prior to the procedure. Lesion quadrant: 9 o'clock RIGHT breast Using sterile technique and 1% Lidocaine as local anesthetic, under direct ultrasound visualization, a 12 gauge spring-loaded device was used to perform biopsy of mass in the 9 o'clock location of the RIGHT breast using a inferior to superior approach. At the conclusion of the procedure ribbon shaped tissue marker clip was deployed into the biopsy cavity. Follow up 2 view mammogram was performed and dictated separately. IMPRESSION: Ultrasound guided biopsy of RIGHT breast mass. No apparent complications. Electronically Signed: By: Nolon Nations M.D. On: 06/28/2022 14:35  MM CLIP PLACEMENT RIGHT  Result Date: 06/28/2022 CLINICAL DATA:  Status post ultrasound-guided core biopsy of RIGHT breast mass. EXAM: 3D DIAGNOSTIC RIGHT MAMMOGRAM POST ULTRASOUND BIOPSY COMPARISON:  Previous exam(s). FINDINGS: 3D Mammographic images were obtained following ultrasound guided biopsy of mass in the 9 o'clock location of the RIGHT breast and placement of a ribbon shaped clip. The biopsy marking clip is in expected position at the site of biopsy. IMPRESSION: Appropriate positioning of the ribbon shaped biopsy marking clip at the site of biopsy in the RIGHT breast mass. Final Assessment: Post Procedure Mammograms for Marker Placement Electronically Signed   By: Nolon Nations M.D.   On: 06/28/2022 14:38  MM DIAG BREAST TOMO UNI RIGHT  Result Date: 06/28/2022 CLINICAL DATA:  Patient was recalled from screening  mammogram for a possible mass in the right breast. EXAM: DIGITAL  DIAGNOSTIC UNILATERAL RIGHT MAMMOGRAM WITH TOMOSYNTHESIS; ULTRASOUND RIGHT BREAST LIMITED TECHNIQUE: Right digital diagnostic mammography and breast tomosynthesis was performed.; Targeted ultrasound examination of the right breast was performed COMPARISON:  Previous exam(s). ACR Breast Density Category b: There are scattered areas of fibroglandular density. FINDINGS: Additional imaging of the right breast was performed. There is persistence of a mass in the lateral aspect of the breast. On physical exam, I palpate a discrete mass in the right breast at 9 o'clock 8 cm from the nipple. Targeted ultrasound is performed, showing an irregular hypoechoic mass in the right breast at 9 o'clock 8 cm from the nipple measuring 1.4 x 0.7 x 1.3 cm. Sonographic evaluation the right axilla does not show any enlarged adenopathy. IMPRESSION: Suspicious 1.4 cm mass in the 9 o'clock region of the right breast 8 cm from the nipple. RECOMMENDATION: Ultrasound-guided core biopsy of the mass in the 9 o'clock region of the right breast is recommended. The biopsy will be scheduled at the patient's convenience. I have discussed the findings and recommendations with the patient. If applicable, a reminder letter will be sent to the patient regarding the next appointment. BI-RADS CATEGORY  4: Suspicious. Electronically Signed   By: Lillia Mountain M.D.   On: 06/28/2022 12:51  US BREAST LTD UNI RIGHT INC AXILLA  Result Date: 06/28/2022 CLINICAL DATA:  Patient was recalled from screening mammogram for a possible mass in the right breast. EXAM: DIGITAL DIAGNOSTIC UNILATERAL RIGHT MAMMOGRAM WITH TOMOSYNTHESIS; ULTRASOUND RIGHT BREAST LIMITED TECHNIQUE: Right digital diagnostic mammography and breast tomosynthesis was performed.; Targeted ultrasound examination of the right breast was performed COMPARISON:  Previous exam(s). ACR Breast Density Category b: There are scattered areas of fibroglandular density. FINDINGS: Additional imaging of the right  breast was performed. There is persistence of a mass in the lateral aspect of the breast. On physical exam, I palpate a discrete mass in the right breast at 9 o'clock 8 cm from the nipple. Targeted ultrasound is performed, showing an irregular hypoechoic mass in the right breast at 9 o'clock 8 cm from the nipple measuring 1.4 x 0.7 x 1.3 cm. Sonographic evaluation the right axilla does not show any enlarged adenopathy. IMPRESSION: Suspicious 1.4 cm mass in the 9 o'clock region of the right breast 8 cm from the nipple. RECOMMENDATION: Ultrasound-guided core biopsy of the mass in the 9 o'clock region of the right breast is recommended. The biopsy will be scheduled at the patient's convenience. I have discussed the findings and recommendations with the patient. If applicable, a reminder letter will be sent to the patient regarding the next appointment. BI-RADS CATEGORY  4: Suspicious. Electronically Signed   By: Lillia Mountain M.D.   On: 06/28/2022 12:51      IMPRESSION/PLAN: 1. Stage IA, [id number], grade 1, ER/PR positive invasive ductal carcinoma of the right breast. Dr. Lisbeth Renshaw discusses the pathology findings and reviews the nature of early stage right breast disease. The consensus from the breast conference includes breast conservation with lumpectomy. Depending on her performance status when she meets with medical oncology, the tumor may be tested for Oncotype Dx score to determine a role for systemic therapy. Provided that chemotherapy is not indicated, the patient's course would then be followed by external radiotherapy to the breast  to reduce risks of local recurrence followed by antiestrogen therapy. We discussed the risks, benefits, short, and long term effects of radiotherapy, as well as the curative intent. Dr.  Lisbeth Renshaw discusses the delivery and logistics of radiotherapy and anticipates a course of 4 weeks of radiotherapy to the right breast. We also discussed possibility in forgoing radiation based on her  final pathology, in addition to ultrahypofractionated course of treatment. She is interested in avoiding radiation if possible. We will reach out following her surgery to make the final decision.     This encounter was conducted via telephone.  The patient has provided two factor identification and has given verbal consent for this type of encounter and has been advised to only accept a meeting of this type in a secure network environment. The time spent during this encounter was 60 minutes including preparation, discussion, and coordination of the patient's care. The attendants for this meeting include Blenda Nicely, RN, Dr. Lisbeth Renshaw, Hayden Pedro  and Jeral Fruit.  During the encounter,  Blenda Nicely, RN, Dr. Lisbeth Renshaw, and Hayden Pedro were located at [facility] Radiation Oncology Department.  Sabrina Baker was located at home.   The above documentation reflects my direct findings during this shared patient visit. Please see the separate note by Dr. Lisbeth Renshaw on this date for the remainder of the patient's plan of care.    Carola Rhine, [facility]    **Disclaimer: This note was dictated with voice recognition software. Similar sounding words can inadvertently be transcribed and this note may contain transcription errors which may not have been corrected upon publication of note.**

## 2022-07-17 ENCOUNTER — Inpatient Hospital Stay: Payer: 59 | Attending: Hematology and Oncology | Admitting: Hematology and Oncology

## 2022-07-17 ENCOUNTER — Other Ambulatory Visit: Payer: Self-pay

## 2022-07-17 VITALS — BP 198/106 | HR 80 | Temp 97.5°F | Resp 18 | Wt 137.1 lb

## 2022-07-17 DIAGNOSIS — Z8616 Personal history of COVID-19: Secondary | ICD-10-CM | POA: Diagnosis not present

## 2022-07-17 DIAGNOSIS — C50411 Malignant neoplasm of upper-outer quadrant of right female breast: Secondary | ICD-10-CM | POA: Diagnosis not present

## 2022-07-17 DIAGNOSIS — Z170 Estrogen receptor positive status [ER+]: Secondary | ICD-10-CM | POA: Insufficient documentation

## 2022-07-17 DIAGNOSIS — Z79899 Other long term (current) drug therapy: Secondary | ICD-10-CM | POA: Diagnosis not present

## 2022-07-17 NOTE — Assessment & Plan Note (Signed)
07/13/2022: Screening mammogram detected possible mass Rt Breast Right 1.4 cm, Axilla Neg, breast Biopsy: Grade 1 IDC with LG DCIS, ER 90%, PR: 40%, Ki 67: 25%, Her 2: 1+ (Neg)  Pathology and radiology counseling:Discussed with the patient, the details of pathology including the type of breast cancer,the clinical staging, the significance of ER, PR and HER-2/neu receptors and the implications for treatment. After reviewing the pathology in detail, we proceeded to discuss the different treatment options between surgery, radiation, chemotherapy, antiestrogen therapies.  Recommendations: 1. Breast conserving surgery followed by 2. Adjuvant radiation therapy followed by 3. Adjuvant antiestrogen therapy  Return to clinic after surgery to discuss final pathology report

## 2022-07-17 NOTE — Progress Notes (Signed)
[name] CONSULT NOTE  Patient Care Team: [name], MD as PCP - General (Family Medicine) Nahser, Wonda Cheng, MD as PCP - Cardiology (Cardiology)  CHIEF COMPLAINTS/PURPOSE OF CONSULTATION:  Newly diagnosed breast cancer  HISTORY OF PRESENTING ILLNESS:  Sabrina Baker 82 y.o. female is here because of recent diagnosis of right breast cancer.  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  She was referred to [place] for discussion regarding treatment options.   I reviewed her records extensively and collaborated the history with the patient.  SUMMARY OF ONCOLOGIC HISTORY: Oncology History  Primary malignant neoplasm of upper outer quadrant of right breast ([place])  07/13/2022 Initial Diagnosis   Screening mammogram detected possible mass Rt Breast Right 1.4 cm, Axilla Neg, breast Biopsy: Grade 1 IDC with LG DCIS, ER 90%, PR: 40%, Ki 67: 25%, Her 2: 1+ (Neg)   07/13/2022 Cancer Staging   Staging form: Breast, AJCC 8th Edition - Clinical stage from 07/13/2022: Stage IA (cT1c, cN0, cM0, G1, ER+, PR+, HER2-) - Signed by Hayden Pedro, PA-C on 07/13/2022 Stage prefix: Initial diagnosis Method of lymph node assessment: Clinical Histologic grading system: 3 grade system      MEDICAL HISTORY:  Past Medical History:  Diagnosis Date   Acute diastolic congestive heart failure (HCC)    Carotid stenosis    Carotid US 1/22: Bilateral ICA 1-39; follow-up as needed   Chronic kidney disease    stage 3   Diabetes mellitus without complication (HCC)    Gout    Hypercholesteremia    Hypertension    Neuromuscular disorder (HCC)    neuropathy   Osteopenia    Retinal detachment    TIA (transient ischemic attack)    Vitamin B12 deficiency     SURGICAL HISTORY: Past Surgical History:  Procedure Laterality  Date   25 GAUGE PARS PLANA VITRECTOMY WITH 20 GAUGE MVR PORT Right 03/16/2015   Procedure: 25 GAUGE PARS PLANA VITRECTOMY WITH 23 GAUGE MVR PORT;  Surgeon: Hayden Pedro, MD;  Location: [facility];  Service: Ophthalmology;  Laterality: Right;   ANKLE SURGERY     BREAST BIOPSY Right 06/28/2022   [place] RT BREAST BX W LOC DEV 1ST LESION IMG BX SPEC US GUIDE 06/28/2022 [facility]   BREAST EXCISIONAL BIOPSY     BREAST LUMPECTOMY     BREAST LUMPECTOMY WITH RADIOACTIVE SEED LOCALIZATION Right 01/24/2016   Procedure: RIGHT BREAST LUMPECTOMY WITH RADIOACTIVE SEED LOCALIZATION;  Surgeon: Coralie Keens, MD;  Location: [facility];  Service: General;  Laterality: Right;   EYE SURGERY     detached retna X2   HEEL SPUR SURGERY  2012   INCISION AND DRAINAGE ABSCESS Right 11/21/2019   Procedure: INCISION AND DRAINAGE FOREHEAD & RIGHT EAR ABSCESS;  Surgeon: Jerrell Belfast, MD;  Location: [facility];  Service: ENT;  Laterality: Right;   INJECTION OF SILICONE OIL Right 19/37/9024   Procedure: Extraction OF SILICONE OIL;  Surgeon: Hayden Pedro, MD;  Location: [facility];  Service: Ophthalmology;  Laterality: Right;   ORIF ANKLE FRACTURE Left 06/17/2014   Procedure: OPEN REDUCTION INTERNAL FIXATION (ORIF) LEFT BIMALLEOLAR ANKLE FRACTURE;  Surgeon: Marianna Payment, MD;  Location: [facility];  Service: Orthopedics;  Laterality: Left;   PHOTOCOAGULATION WITH LASER Right 03/16/2015   Procedure: PHOTOCOAGULATION WITH LASER;  Surgeon:  Hayden Pedro, MD;  Location: [place];  Service: Ophthalmology;  Laterality: Right;   SHOULDER ARTHROSCOPY Right    plates and screws   VITRECTOMY Right 03/16/2015   WRIST ARTHROPLASTY Left    plates and screws and bone graft    SOCIAL HISTORY: Social History   Socioeconomic History   Marital status: Divorced    Spouse name: Not on file   Number of children: Not on file   Years of education: Not on file   Highest education level: Not on file  Occupational History    Not on file  Tobacco Use   Smoking status: Never   Smokeless tobacco: Never  Vaping Use   Vaping Use: Never used  Substance and Sexual Activity   Alcohol use: No   Drug use: No   Sexual activity: Never    Birth control/protection: Post-menopausal  Other Topics Concern   Not on file  Social History Narrative   Not on file   Social Determinants of Health   Financial Resource Strain: Not on file  Food Insecurity: Not on file  Transportation Needs: Not on file  Physical Activity: Not on file  Stress: Not on file  Social Connections: Not on file  Intimate Partner Violence: Not on file    FAMILY HISTORY: No family history on file.  ALLERGIES:  is allergic to ramipril, other, cortisone, niacin, niacin and related, and valsartan.  MEDICATIONS:  Current Outpatient Medications  Medication Sig Dispense Refill   acetaminophen (TYLENOL) 650 MG CR tablet Take 1,300 mg by mouth every 8 (eight) hours as needed for pain.     apixaban (ELIQUIS) 2.5 MG TABS tablet Take 1 tablet (2.5 mg total) by mouth 2 (two) times daily. 180 tablet 1   atorvastatin (LIPITOR) 10 MG tablet Take 10 mg by mouth daily.     bevacizumab (AVASTIN) 400 MG/16ML SOLN Inject into the vein.     Blood Glucose Monitoring Suppl (GLUCOCOM BLOOD GLUCOSE MONITOR) DEVI Use to test blood sugar 3 times a day (E11.65)     brimonidine (ALPHAGAN) 0.2 % ophthalmic solution Place 1 drop into the left eye 2 (two) times daily.     calcitRIOL (ROCALTROL) 0.25 MCG capsule Take 0.25 mcg by mouth 3 (three) times a week. Mon, Wed, Friday     calcium-vitamin D (OSCAL WITH D) 500-200 MG-UNIT TABS tablet Take 1 tablet by mouth daily.      carvedilol (COREG) 25 MG tablet Take 25 mg by mouth 2 (two) times daily with a meal.     colchicine 0.6 MG tablet Take 0.6 mg by mouth daily.     cyanocobalamin 100 MCG tablet Take 100 mcg by mouth daily.     diclofenac Sodium (VOLTAREN) 1 % GEL Apply topically 4 (four) times daily.     DULoxetine  (CYMBALTA) 60 MG capsule Take 60 mg by mouth daily.     febuxostat (ULORIC) 40 MG tablet Take 40 mg by mouth daily. One tab a day     Ferrous Sulfate (IRON) 325 (65 FE) MG TABS Take 1 tablet by mouth 2 (two) times daily.     furosemide (LASIX) 40 MG tablet Take 1 tablet (40 mg total) by mouth daily. 30 tablet 0   gabapentin (NEURONTIN) 300 MG capsule Take 1 capsule (300 mg total) by mouth 2 (two) times daily. Started with 300 mg nightly x 1 week, then increase to 300 mg BID or 60 '0mg'$  QHS- for nerve pain 60 capsule 5   HYDROcodone-acetaminophen (NORCO/VICODIN)  5-325 MG tablet Take 1 tablet by mouth every 6 (six) hours as needed for moderate pain. Will increase back to 100 tabs max/month- for chronic pain 100 tablet 0   insulin glargine, 2 Unit Dial, (TOUJEO MAX SOLOSTAR) 300 UNIT/ML Solostar Pen Inject 10 Units into the skin daily. (Patient taking differently: Inject 18 Units into the skin daily.)     Insulin Pen Needle 32G X 4 MM MISC USE TO INJECT INSULIN ONCE A DAY     ketoconazole (NIZORAL) 2 % shampoo Apply 1 application topically 2 (two) times a week.      Lancets 30G MISC Use to test blood sugars 3 times a day (Dx: E11.65)     mupirocin cream (BACTROBAN) 2 % Apply topically 2 (two) times daily. Apply to forehead wound until healed (Patient not taking: Reported on 07/13/2022) 15 g 0   nateglinide (STARLIX) 120 MG tablet Take 60 mg by mouth 3 (three) times daily as needed. If CBG is above 110     ONETOUCH VERIO test strip USE AS DIRECTED. TESTING FREQUENCY  3 X/DAILY (DX E11.65)     pantoprazole (PROTONIX) 40 MG tablet 1 tablet Orally twice a day Dx: K21.9 for 90 days     triamcinolone cream (KENALOG) 0.1 % Apply 1 application topically 2 (two) times daily as needed (For rash).      trimethoprim-polymyxin b (POLYTRIM) ophthalmic solution Use 4 times in left eye the day of injection, 4 drop the following day.     TRULICITY 1.5 YB/0.1BP SOPN Inject 3 mg into the skin every 7 (seven) days. Friday      No current facility-administered medications for this visit.    REVIEW OF SYSTEMS:   Constitutional: Denies fevers, chills or abnormal night sweats Breast:  Denies any palpable lumps or discharge All other systems were reviewed with the patient and are negative.  PHYSICAL EXAMINATION: ECOG PERFORMANCE STATUS: 2 - Symptomatic, <50% confined to bed  Vitals:   07/17/22 1512  BP: (!) 198/106  Pulse: 80  Resp: 18  Temp: (!) 97.5 F (36.4 C)  SpO2: 96%   Filed Weights   07/17/22 1512  Weight: 137 lb 1 oz (62.2 kg)    GENERAL:alert, no distress and comfortable    LABORATORY DATA:  I have reviewed the data as listed Lab Results  Component Value Date   WBC 8.8 07/05/2022   HGB 13.3 07/05/2022   HCT 39.6 07/05/2022   MCV 99.0 07/05/2022   PLT 276 07/05/2022   Lab Results  Component Value Date   NA 139 03/22/2021   K 4.5 03/22/2021   CL 96 (L) 03/22/2021   CO2 35 (H) 03/22/2021    RADIOGRAPHIC STUDIES: I have personally reviewed the radiological reports and agreed with the findings in the report.  ASSESSMENT AND PLAN:  Primary malignant neoplasm of upper outer quadrant of right breast ([place]) 07/13/2022: Screening mammogram detected possible mass Rt Breast Right 1.4 cm, Axilla Neg, breast Biopsy: Grade 1 IDC with LG DCIS, ER 90%, PR: 40%, Ki 67: 25%, Her 2: 1+ (Neg)  Pathology and radiology counseling:Discussed with the patient, the details of pathology including the type of breast cancer,the clinical staging, the significance of ER, PR and HER-2/neu receptors and the implications for treatment. After reviewing the pathology in detail, we proceeded to discuss the different treatment options between surgery, radiation, chemotherapy, antiestrogen therapies.  Recommendations: 1. Breast conserving surgery (waiting on cardiology clearance for surgery) 2. Adjuvant radiation therapy vs  Adjuvant antiestrogen therapy (patient  is interested in antiestrogen therapy alone)  She  is able to walk with the help of her walker.  She had COVID infection and had to be intubated twice in 2020. Return to clinic after surgery to discuss final pathology report  All questions were answered. The patient knows to call the clinic with any problems, questions or concerns.    Harriette Ohara, MD 07/17/22

## 2022-07-18 ENCOUNTER — Ambulatory Visit: Payer: 59 | Attending: Internal Medicine | Admitting: Nurse Practitioner

## 2022-07-18 ENCOUNTER — Encounter: Payer: Self-pay | Admitting: *Deleted

## 2022-07-18 DIAGNOSIS — Z01810 Encounter for preprocedural cardiovascular examination: Secondary | ICD-10-CM

## 2022-07-18 NOTE — Progress Notes (Signed)
Virtual Visit via Telephone Note   Because of Sabrina Baker's co-morbid illnesses, she is at least at moderate risk for complications without adequate follow up.  This format is felt to be most appropriate for this patient at this time.  The patient did not have access to video technology/had technical difficulties with video requiring transitioning to audio format only (telephone).  All issues noted in this document were discussed and addressed.  No physical exam could be performed with this format.  Please refer to the patient's chart for her consent to telehealth for [facility].  Evaluation Performed:  Preoperative cardiovascular risk assessment _____________   Date:  07/18/2022   Patient ID:  Sabrina Baker, DOB Apr 11, 1941, MRN [id number] Patient Location:  Home Provider location:   Office  Primary Care Provider:  [name], MD Primary Cardiologist:  Mertie Moores, MD  Chief Complaint / Patient Profile   82 y.o. y/o female with a h/o paroxysmal atrial fibrillation, chronic diastolic heart failure, nonrheumatic aortic valve stenosis, hypertension, and hyperlipidemia who is pending R breast lumpectomy with Dr. Evlyn Courier of [facility] and presents today for telephonic preoperative cardiovascular risk assessment.  History of Present Illness    [name] is a 82 y.o. female who presents via audio/video conferencing for a telehealth visit today.  Pt was last seen in cardiology clinic on 04/17/2022 by Dr. Acie Fredrickson.  At that time ALYSIANA ETHRIDGE was doing well.  The patient is now pending procedure as outlined above. Since her last visit, she has done well from a cardiac standpoint.   She denies chest pain, palpitations, dyspnea, pnd, orthopnea, n, v, dizziness, syncope, edema, weight gain, or early satiety. All other systems reviewed and are otherwise negative except as noted above.   Past Medical History    Past Medical History:  Diagnosis Date   Acute  diastolic congestive heart failure ([place])    Carotid stenosis    Carotid US 1/22: Bilateral ICA 1-39; follow-up as needed   Chronic kidney disease    stage 3   Diabetes mellitus without complication (HCC)    Gout    Hypercholesteremia    Hypertension    Neuromuscular disorder (HCC)    neuropathy   Osteopenia    Retinal detachment    TIA (transient ischemic attack)    Vitamin B12 deficiency    Past Surgical History:  Procedure Laterality Date   25 GAUGE PARS PLANA VITRECTOMY WITH 20 GAUGE MVR PORT Right 03/16/2015   Procedure: 25 GAUGE PARS PLANA VITRECTOMY WITH 23 GAUGE MVR PORT;  Surgeon: Hayden Pedro, MD;  Location: [facility];  Service: Ophthalmology;  Laterality: Right;   ANKLE SURGERY     BREAST BIOPSY Right 06/28/2022   [place] RT BREAST BX W LOC DEV 1ST LESION IMG BX SPEC US GUIDE 06/28/2022 [facility]   BREAST EXCISIONAL BIOPSY     BREAST LUMPECTOMY     BREAST LUMPECTOMY WITH RADIOACTIVE SEED LOCALIZATION Right 01/24/2016   Procedure: RIGHT BREAST LUMPECTOMY WITH RADIOACTIVE SEED LOCALIZATION;  Surgeon: Coralie Keens, MD;  Location: [place];  Service: General;  Laterality: Right;   EYE SURGERY     detached retna X2   HEEL SPUR SURGERY  2012   INCISION AND DRAINAGE ABSCESS Right 11/21/2019   Procedure: INCISION AND DRAINAGE FOREHEAD & RIGHT EAR ABSCESS;  Surgeon: Jerrell Belfast, MD;  Location: [facility];  Service: ENT;  Laterality: Right;   INJECTION OF SILICONE OIL Right 14/43/1540  Procedure: Extraction OF SILICONE OIL;  Surgeon: Hayden Pedro, MD;  Location: [place];  Service: Ophthalmology;  Laterality: Right;   ORIF ANKLE FRACTURE Left 06/17/2014   Procedure: OPEN REDUCTION INTERNAL FIXATION (ORIF) LEFT BIMALLEOLAR ANKLE FRACTURE;  Surgeon: Marianna Payment, MD;  Location: [place];  Service: Orthopedics;  Laterality: Left;   PHOTOCOAGULATION WITH LASER Right 03/16/2015   Procedure: PHOTOCOAGULATION WITH LASER;  Surgeon: Hayden Pedro, MD;   Location: [place];  Service: Ophthalmology;  Laterality: Right;   SHOULDER ARTHROSCOPY Right    plates and screws   VITRECTOMY Right 03/16/2015   WRIST ARTHROPLASTY Left    plates and screws and bone graft    Allergies  Allergies  Allergen Reactions   Ramipril Anaphylaxis and Other (See Comments)   Other Other (See Comments)   Cortisone Itching and Rash   Niacin Diarrhea    And nausea   Niacin And Related Diarrhea   Valsartan Other (See Comments)    Hyperkalemia     Home Medications    Prior to Admission medications   Medication Sig Start Date End Date Taking? Authorizing Provider  acetaminophen (TYLENOL) 650 MG CR tablet Take 1,300 mg by mouth every 8 (eight) hours as needed for pain.    [provider]  apixaban (ELIQUIS) 2.5 MG TABS tablet Take 1 tablet (2.5 mg total) by mouth 2 (two) times daily. 07/11/22   Nahser, Wonda Cheng, MD  atorvastatin (LIPITOR) 10 MG tablet Take 10 mg by mouth daily.    [provider]  bevacizumab (AVASTIN) 400 MG/16ML SOLN Inject into the vein.    [provider]  Blood Glucose Monitoring Suppl (GLUCOCOM BLOOD GLUCOSE MONITOR) DEVI Use to test blood sugar 3 times a day (E11.65) 06/18/20   [provider]  brimonidine (ALPHAGAN) 0.2 % ophthalmic solution Place 1 drop into the left eye 2 (two) times daily. 04/01/22   [provider]  calcitRIOL (ROCALTROL) 0.25 MCG capsule Take 0.25 mcg by mouth 3 (three) times a week. Jory Sims, Friday 12/17/20   [provider]  calcium-vitamin D (OSCAL WITH D) 500-200 MG-UNIT TABS tablet Take 1 tablet by mouth daily.     [provider]  carvedilol (COREG) 25 MG tablet Take 25 mg by mouth 2 (two) times daily with a meal.    [provider]  colchicine 0.6 MG tablet Take 0.6 mg by mouth daily.    [provider]  cyanocobalamin 100 MCG tablet Take 100 mcg by mouth daily.    [provider]  diclofenac Sodium (VOLTAREN) 1 % GEL Apply  topically 4 (four) times daily.    [provider]  DULoxetine (CYMBALTA) 60 MG capsule Take 60 mg by mouth daily. 07/03/18   [provider]  febuxostat (ULORIC) 40 MG tablet Take 40 mg by mouth daily. One tab a day    [provider]  Ferrous Sulfate (IRON) 325 (65 FE) MG TABS Take 1 tablet by mouth 2 (two) times daily.    [provider]  furosemide (LASIX) 40 MG tablet Take 1 tablet (40 mg total) by mouth daily. 03/22/21 07/12/22  Elodia Florence., MD  gabapentin (NEURONTIN) 300 MG capsule Take 1 capsule (300 mg total) by mouth 2 (two) times daily. Started with 300 mg nightly x 1 week, then increase to 300 mg BID or 60 '0mg'$  QHS- for nerve pain 04/05/22   Lovorn, Jinny Blossom, MD  HYDROcodone-acetaminophen (NORCO/VICODIN) 5-325 MG tablet Take 1 tablet by mouth every  6 (six) hours as needed for moderate pain. Will increase back to 100 tabs max/month- for chronic pain 07/10/22   Lovorn, Jinny Blossom, MD  insulin glargine, 2 Unit Dial, (TOUJEO MAX SOLOSTAR) 300 UNIT/ML Solostar Pen Inject 10 Units into the skin daily. Patient taking differently: Inject 18 Units into the skin daily. 03/22/21   Elodia Florence., MD  Insulin Pen Needle 32G X 4 MM MISC USE TO INJECT INSULIN ONCE A DAY 04/05/18   [provider]  ketoconazole (NIZORAL) 2 % shampoo Apply 1 application topically 2 (two) times a week.  02/27/19   [provider]  Lancets 30G MISC Use to test blood sugars 3 times a day (Dx: E11.65)    [provider]  mupirocin cream (BACTROBAN) 2 % Apply topically 2 (two) times daily. Apply to forehead wound until healed Patient not taking: Reported on 07/13/2022 11/25/19   Nolberto Hanlon, MD  nateglinide (STARLIX) 120 MG tablet Take 60 mg by mouth 3 (three) times daily as needed. If CBG is above 110 11/22/20   [provider]  ONETOUCH VERIO test strip USE AS DIRECTED. TESTING FREQUENCY  3 X/DAILY (DX E11.65) 11/17/18   [provider]   pantoprazole (PROTONIX) 40 MG tablet 1 tablet Orally twice a day Dx: K21.9 for 90 days    [provider]  triamcinolone cream (KENALOG) 0.1 % Apply 1 application topically 2 (two) times daily as needed (For rash).  01/09/17   [provider]  trimethoprim-polymyxin b (POLYTRIM) ophthalmic solution Use 4 times in left eye the day of injection, 4 drop the following day.    [provider]  TRULICITY 1.5 OM/3.5DH SOPN Inject 3 mg into the skin every 7 (seven) days. Friday 11/25/18   [provider]    Physical Exam    Vital Signs:  KARMELLA BOUVIER does not have vital signs available for review today.  Given telephonic nature of communication, physical exam is limited. AAOx3. NAD. Normal affect.  Speech and respirations are unlabored.  Accessory Clinical Findings    None  Assessment & Plan    1.  Preoperative Cardiovascular Risk Assessment: According to the Revised Cardiac Risk Index (RCRI), her Perioperative Risk of Major Cardiac Event is (%): 0.9. Her Functional Capacity in METs is: 4.4 according to the Duke Activity Status Index (DASI).Therefore, based on ACC/AHA guidelines, patient would be at acceptable risk for the planned procedure without further cardiovascular testing.  The patient was advised that if she develops new symptoms prior to surgery to contact our office to arrange for a follow-up visit, and she verbalized understanding.  Per office protocol, patient can hold Eliquis for 3 days prior to procedure.  Please resume Eliquis as soon as possible postprocedure, at the discretion of the surgeon.   A copy of this note will be routed to requesting surgeon.  Time:   Today, I have spent 10 minutes with the patient with telehealth technology discussing medical history, symptoms, and management plan.     Lenna Sciara, NP  07/18/2022, 2:31 PM

## 2022-07-20 ENCOUNTER — Other Ambulatory Visit: Payer: Self-pay | Admitting: Surgery

## 2022-07-20 ENCOUNTER — Telehealth: Payer: Self-pay | Admitting: Radiology

## 2022-07-20 DIAGNOSIS — C50411 Malignant neoplasm of upper-outer quadrant of right female breast: Secondary | ICD-10-CM

## 2022-07-20 DIAGNOSIS — Z853 Personal history of malignant neoplasm of breast: Secondary | ICD-10-CM

## 2022-07-20 NOTE — Telephone Encounter (Signed)
[employer] 2021-05 - Specimen Collection Study to Evaluate Biomarkers in Subjects with Cancer    07/20/2022  PHONE CALL: Confirmed I was speaking with Sabrina Baker . Informed patient reason for all is to introduce the above mentioned study. This coordinator explained study in detail including the voluntary nature of study. Patient stated she has multiple appts and many things going on at this time and is not interested in adding more on to her schedule at this time. This coordinator expressed understanding and thanked patient for her time.   Carol Ada, RT(R)(T) Clinical Research Coordinator

## 2022-07-24 ENCOUNTER — Other Ambulatory Visit: Payer: Self-pay | Admitting: Physical Medicine and Rehabilitation

## 2022-07-24 ENCOUNTER — Encounter (INDEPENDENT_AMBULATORY_CARE_PROVIDER_SITE_OTHER): Payer: 59 | Admitting: Ophthalmology

## 2022-07-24 DIAGNOSIS — H35033 Hypertensive retinopathy, bilateral: Secondary | ICD-10-CM

## 2022-07-24 DIAGNOSIS — E113312 Type 2 diabetes mellitus with moderate nonproliferative diabetic retinopathy with macular edema, left eye: Secondary | ICD-10-CM

## 2022-07-24 DIAGNOSIS — H43812 Vitreous degeneration, left eye: Secondary | ICD-10-CM

## 2022-07-24 DIAGNOSIS — E113511 Type 2 diabetes mellitus with proliferative diabetic retinopathy with macular edema, right eye: Secondary | ICD-10-CM

## 2022-07-24 DIAGNOSIS — H338 Other retinal detachments: Secondary | ICD-10-CM | POA: Diagnosis not present

## 2022-07-24 DIAGNOSIS — H35372 Puckering of macula, left eye: Secondary | ICD-10-CM

## 2022-07-24 DIAGNOSIS — H353122 Nonexudative age-related macular degeneration, left eye, intermediate dry stage: Secondary | ICD-10-CM

## 2022-07-24 DIAGNOSIS — I10 Essential (primary) hypertension: Secondary | ICD-10-CM

## 2022-07-24 NOTE — Telephone Encounter (Signed)
Requesting prescription refill for Hydrocodone-acetaminophen and gabapentin 300

## 2022-07-25 ENCOUNTER — Telehealth: Payer: Self-pay | Admitting: Physical Medicine and Rehabilitation

## 2022-07-25 NOTE — Telephone Encounter (Signed)
Nothing needed. 

## 2022-07-26 ENCOUNTER — Inpatient Hospital Stay: Payer: 59 | Admitting: Licensed Clinical Social Worker

## 2022-07-26 MED ORDER — HYDROCODONE-ACETAMINOPHEN 5-325 MG PO TABS: 1.0000 | ORAL_TABLET | Freq: Four times a day (QID) | ORAL | 0 refills | Status: DC | PRN

## 2022-07-26 MED ORDER — GABAPENTIN 300 MG PO CAPS: 300.0000 mg | ORAL_CAPSULE | Freq: Two times a day (BID) | ORAL | 5 refills | Status: DC

## 2022-07-26 NOTE — Progress Notes (Signed)
[facility] Work  Clinical Social Work was referred by new patient protocol for assessment of psychosocial needs.  Clinical Social Worker contacted patient by phone  to offer support and assess for needs.   Patient reports doing well overall. She is trying not to dwell on the cancer and to take each step as it comes. She does find it helpful to gather information and then take time to make her best decision.  Pt lives alone and does utilize her insurance transportation benefit for other medical appts. Discussed [facility] transportation for appts here if she does not have rides left and if her daughter is unable to assist.  Pt is asking Dr. Trevor Mace office about potential stay at rehab for about a week post-surgery as this is what she has done after other surgeries as she adjusts.   Pt does have good support from daughter, a few friends, and her pastor. She also utilizes a positive outlook and sense of humor to cope.  No needs at this time. CSW encouraged pt to reach out should she need support in the future.   [place], Clarissa Worker [employer]

## 2022-07-27 ENCOUNTER — Encounter (HOSPITAL_BASED_OUTPATIENT_CLINIC_OR_DEPARTMENT_OTHER): Payer: Self-pay | Admitting: Surgery

## 2022-07-31 ENCOUNTER — Encounter (HOSPITAL_BASED_OUTPATIENT_CLINIC_OR_DEPARTMENT_OTHER)
Admission: RE | Admit: 2022-07-31 | Discharge: 2022-07-31 | Disposition: A | Discharge status: Home / Self Care | Payer: 59 | Source: Ambulatory Visit | Attending: Surgery | Admitting: Surgery

## 2022-07-31 ENCOUNTER — Ambulatory Visit: Admission: RE | Admit: 2022-07-31 | Payer: 59 | Source: Ambulatory Visit

## 2022-07-31 DIAGNOSIS — Z01812 Encounter for preprocedural laboratory examination: Secondary | ICD-10-CM | POA: Insufficient documentation

## 2022-07-31 LAB — BASIC METABOLIC PANEL
Anion gap: 10 (ref 5–15)
BUN: 38 mg/dL — ABNORMAL HIGH (ref 8–23)
CO2: 31 mmol/L (ref 22–32)
Calcium: 9.1 mg/dL (ref 8.9–10.3)
Chloride: 95 mmol/L — ABNORMAL LOW (ref 98–111)
Creatinine, Ser: 1.49 mg/dL — ABNORMAL HIGH (ref 0.44–1.00)
GFR, Estimated: 35 mL/min — ABNORMAL LOW (ref >60)
Glucose, Bld: 159 mg/dL — ABNORMAL HIGH (ref 70–99)
Potassium: 4.7 mmol/L (ref 3.5–5.1)
Sodium: 136 mmol/L (ref 135–145)

## 2022-07-31 MED ORDER — ENSURE PRE-SURGERY PO LIQD: 296.0000 mL | Freq: Once | ORAL | Status: DC

## 2022-07-31 NOTE — Progress Notes (Signed)

## 2022-08-02 ENCOUNTER — Ambulatory Visit
Admission: RE | Admit: 2022-08-02 | Discharge: 2022-08-02 | Disposition: A | Discharge status: Home / Self Care | Payer: 59 | Source: Ambulatory Visit | Attending: Surgery | Admitting: Surgery

## 2022-08-02 DIAGNOSIS — Z853 Personal history of malignant neoplasm of breast: Secondary | ICD-10-CM

## 2022-08-02 DIAGNOSIS — C50911 Malignant neoplasm of unspecified site of right female breast: Secondary | ICD-10-CM | POA: Diagnosis not present

## 2022-08-02 HISTORY — PX: BREAST BIOPSY: SHX20

## 2022-08-03 ENCOUNTER — Encounter (HOSPITAL_BASED_OUTPATIENT_CLINIC_OR_DEPARTMENT_OTHER): Payer: Self-pay | Admitting: Surgery

## 2022-08-03 ENCOUNTER — Other Ambulatory Visit: Payer: Self-pay

## 2022-08-03 ENCOUNTER — Ambulatory Visit (HOSPITAL_BASED_OUTPATIENT_CLINIC_OR_DEPARTMENT_OTHER): Payer: 59 | Admitting: Anesthesiology

## 2022-08-03 ENCOUNTER — Ambulatory Visit
Admission: RE | Admit: 2022-08-03 | Discharge: 2022-08-03 | Disposition: A | Discharge status: Home / Self Care | Payer: 59 | Source: Ambulatory Visit | Attending: Surgery | Admitting: Surgery

## 2022-08-03 ENCOUNTER — Encounter (HOSPITAL_BASED_OUTPATIENT_CLINIC_OR_DEPARTMENT_OTHER)
Admission: RE | Disposition: A | Discharge status: Home / Self Care | Payer: Self-pay | Source: Home / Self Care | Attending: Surgery

## 2022-08-03 ENCOUNTER — Ambulatory Visit (HOSPITAL_BASED_OUTPATIENT_CLINIC_OR_DEPARTMENT_OTHER)
Admission: RE | Admit: 2022-08-03 | Discharge: 2022-08-04 | Disposition: A | Discharge status: Home / Self Care | Payer: 59 | Attending: Surgery | Admitting: Surgery

## 2022-08-03 DIAGNOSIS — C50811 Malignant neoplasm of overlapping sites of right female breast: Secondary | ICD-10-CM | POA: Diagnosis not present

## 2022-08-03 DIAGNOSIS — G8929 Other chronic pain: Secondary | ICD-10-CM | POA: Insufficient documentation

## 2022-08-03 DIAGNOSIS — N6011 Diffuse cystic mastopathy of right breast: Secondary | ICD-10-CM | POA: Insufficient documentation

## 2022-08-03 DIAGNOSIS — I130 Hypertensive heart and chronic kidney disease with heart failure and stage 1 through stage 4 chronic kidney disease, or unspecified chronic kidney disease: Secondary | ICD-10-CM | POA: Insufficient documentation

## 2022-08-03 DIAGNOSIS — Z01818 Encounter for other preprocedural examination: Secondary | ICD-10-CM

## 2022-08-03 DIAGNOSIS — Z794 Long term (current) use of insulin: Secondary | ICD-10-CM | POA: Diagnosis not present

## 2022-08-03 DIAGNOSIS — N6021 Fibroadenosis of right breast: Secondary | ICD-10-CM | POA: Insufficient documentation

## 2022-08-03 DIAGNOSIS — N189 Chronic kidney disease, unspecified: Secondary | ICD-10-CM | POA: Diagnosis not present

## 2022-08-03 DIAGNOSIS — I110 Hypertensive heart disease with heart failure: Secondary | ICD-10-CM | POA: Diagnosis not present

## 2022-08-03 DIAGNOSIS — E1122 Type 2 diabetes mellitus with diabetic chronic kidney disease: Secondary | ICD-10-CM | POA: Diagnosis not present

## 2022-08-03 DIAGNOSIS — C50911 Malignant neoplasm of unspecified site of right female breast: Secondary | ICD-10-CM | POA: Diagnosis not present

## 2022-08-03 DIAGNOSIS — I4891 Unspecified atrial fibrillation: Secondary | ICD-10-CM | POA: Diagnosis not present

## 2022-08-03 DIAGNOSIS — Z9889 Other specified postprocedural states: Secondary | ICD-10-CM

## 2022-08-03 DIAGNOSIS — Z7901 Long term (current) use of anticoagulants: Secondary | ICD-10-CM | POA: Diagnosis not present

## 2022-08-03 DIAGNOSIS — E119 Type 2 diabetes mellitus without complications: Secondary | ICD-10-CM

## 2022-08-03 DIAGNOSIS — I5031 Acute diastolic (congestive) heart failure: Secondary | ICD-10-CM | POA: Diagnosis not present

## 2022-08-03 DIAGNOSIS — R928 Other abnormal and inconclusive findings on diagnostic imaging of breast: Secondary | ICD-10-CM | POA: Diagnosis not present

## 2022-08-03 DIAGNOSIS — Z170 Estrogen receptor positive status [ER+]: Secondary | ICD-10-CM | POA: Insufficient documentation

## 2022-08-03 DIAGNOSIS — I509 Heart failure, unspecified: Secondary | ICD-10-CM | POA: Diagnosis not present

## 2022-08-03 DIAGNOSIS — Z853 Personal history of malignant neoplasm of breast: Secondary | ICD-10-CM

## 2022-08-03 HISTORY — PX: BREAST LUMPECTOMY WITH RADIOACTIVE SEED LOCALIZATION: SHX6424

## 2022-08-03 LAB — GLUCOSE, CAPILLARY
Glucose-Capillary: 218 mg/dL — ABNORMAL HIGH (ref 70–99)
Glucose-Capillary: 406 mg/dL — ABNORMAL HIGH (ref 70–99)
Glucose-Capillary: 453 mg/dL — ABNORMAL HIGH (ref 70–99)
Glucose-Capillary: 458 mg/dL — ABNORMAL HIGH (ref 70–99)
Glucose-Capillary: 544 mg/dL (ref 70–99)
Glucose-Capillary: 555 mg/dL (ref 70–99)
Glucose-Capillary: 84 mg/dL (ref 70–99)
Glucose-Capillary: 95 mg/dL (ref 70–99)

## 2022-08-03 SURGERY — BREAST LUMPECTOMY WITH RADIOACTIVE SEED LOCALIZATION
Anesthesia: General | Site: Breast | Laterality: Right

## 2022-08-03 MED ORDER — ONDANSETRON 4 MG PO TBDP: 4.0000 mg | ORAL_TABLET | Freq: Four times a day (QID) | ORAL | Status: DC | PRN

## 2022-08-03 MED ORDER — INSULIN ASPART 100 UNIT/ML IJ SOLN
0.0000 [IU] | Freq: Three times a day (TID) | INTRAMUSCULAR | Status: DC
  Administered 2022-08-03: 3 [IU] via SUBCUTANEOUS
  Filled 2022-08-03 (×2): qty 1

## 2022-08-03 MED ORDER — OXYCODONE HCL 5 MG PO TABS: 5.0000 mg | ORAL_TABLET | ORAL | Status: DC | PRN

## 2022-08-03 MED ORDER — PROPOFOL 10 MG/ML IV BOLUS
INTRAVENOUS | Status: AC
  Filled 2022-08-03: qty 20

## 2022-08-03 MED ORDER — DIPHENHYDRAMINE HCL 50 MG/ML IJ SOLN: 12.5000 mg | Freq: Four times a day (QID) | INTRAMUSCULAR | Status: DC | PRN

## 2022-08-03 MED ORDER — FENTANYL CITRATE (PF) 100 MCG/2ML IJ SOLN
INTRAMUSCULAR | Status: AC
  Filled 2022-08-03: qty 2

## 2022-08-03 MED ORDER — ONDANSETRON HCL 4 MG/2ML IJ SOLN: 4.0000 mg | Freq: Four times a day (QID) | INTRAMUSCULAR | Status: DC | PRN

## 2022-08-03 MED ORDER — NATEGLINIDE 60 MG PO TABS
60.0000 mg | ORAL_TABLET | Freq: Three times a day (TID) | ORAL | Status: DC
  Filled 2022-08-03: qty 1

## 2022-08-03 MED ORDER — DULOXETINE HCL 60 MG PO CPEP
60.0000 mg | ORAL_CAPSULE | Freq: Every day | ORAL | Status: DC
  Filled 2022-08-03: qty 1

## 2022-08-03 MED ORDER — FUROSEMIDE 40 MG PO TABS
40.0000 mg | ORAL_TABLET | Freq: Every day | ORAL | Status: DC
  Filled 2022-08-03: qty 1

## 2022-08-03 MED ORDER — CARVEDILOL 25 MG PO TABS
25.0000 mg | ORAL_TABLET | Freq: Two times a day (BID) | ORAL | Status: DC
  Administered 2022-08-03: 25 mg via ORAL
  Filled 2022-08-03: qty 1

## 2022-08-03 MED ORDER — ONDANSETRON HCL 4 MG/2ML IJ SOLN
INTRAMUSCULAR | Status: AC
  Filled 2022-08-03: qty 2

## 2022-08-03 MED ORDER — LACTATED RINGERS IV SOLN: INTRAVENOUS | Status: DC

## 2022-08-03 MED ORDER — INSULIN ASPART 100 UNIT/ML IJ SOLN
10.0000 [IU] | Freq: Once | INTRAMUSCULAR | Status: AC
  Administered 2022-08-03: 10 [IU] via SUBCUTANEOUS
  Filled 2022-08-03: qty 1

## 2022-08-03 MED ORDER — CHLORHEXIDINE GLUCONATE CLOTH 2 % EX PADS: 6.0000 | MEDICATED_PAD | Freq: Once | CUTANEOUS | Status: DC

## 2022-08-03 MED ORDER — DIPHENHYDRAMINE HCL 12.5 MG/5ML PO ELIX: 12.5000 mg | ORAL_SOLUTION | Freq: Four times a day (QID) | ORAL | Status: DC | PRN

## 2022-08-03 MED ORDER — SODIUM CHLORIDE 0.9 % IV SOLN: INTRAVENOUS | Status: DC

## 2022-08-03 MED ORDER — PHENYLEPHRINE HCL (PRESSORS) 10 MG/ML IV SOLN
INTRAVENOUS | Status: DC | PRN
  Administered 2022-08-03: 120 ug via INTRAVENOUS
  Administered 2022-08-03: 80 ug via INTRAVENOUS
  Administered 2022-08-03: 40 ug via INTRAVENOUS

## 2022-08-03 MED ORDER — ACETAMINOPHEN 500 MG PO TABS
1000.0000 mg | ORAL_TABLET | Freq: Once | ORAL | Status: AC
  Administered 2022-08-03: 500 mg via ORAL

## 2022-08-03 MED ORDER — BRIMONIDINE TARTRATE 0.2 % OP SOLN: 1.0000 [drp] | Freq: Two times a day (BID) | OPHTHALMIC | Status: DC

## 2022-08-03 MED ORDER — FENTANYL CITRATE (PF) 100 MCG/2ML IJ SOLN
INTRAMUSCULAR | Status: DC | PRN
  Administered 2022-08-03: 25 ug via INTRAVENOUS

## 2022-08-03 MED ORDER — DEXAMETHASONE SODIUM PHOSPHATE 4 MG/ML IJ SOLN
INTRAMUSCULAR | Status: DC | PRN
  Administered 2022-08-03: 4 mg via INTRAVENOUS

## 2022-08-03 MED ORDER — ACETAMINOPHEN 500 MG PO TABS: 1000.0000 mg | ORAL_TABLET | ORAL | Status: AC

## 2022-08-03 MED ORDER — EPHEDRINE SULFATE (PRESSORS) 50 MG/ML IJ SOLN
INTRAMUSCULAR | Status: DC | PRN
  Administered 2022-08-03: 5 mg via INTRAVENOUS
  Administered 2022-08-03: 10 mg via INTRAVENOUS
  Administered 2022-08-03: 5 mg via INTRAVENOUS

## 2022-08-03 MED ORDER — INSULIN GLARGINE-YFGN 100 UNIT/ML ~~LOC~~ SOLN
18.0000 [IU] | Freq: Once | SUBCUTANEOUS | Status: AC
  Administered 2022-08-03: 18 [IU] via SUBCUTANEOUS
  Filled 2022-08-03: qty 0.18

## 2022-08-03 MED ORDER — ONDANSETRON HCL 4 MG/2ML IJ SOLN
INTRAMUSCULAR | Status: DC | PRN
  Administered 2022-08-03: 4 mg via INTRAVENOUS

## 2022-08-03 MED ORDER — BUPIVACAINE HCL (PF) 0.5 % IJ SOLN
INTRAMUSCULAR | Status: DC | PRN
  Administered 2022-08-03: 20 mL

## 2022-08-03 MED ORDER — LIDOCAINE HCL (CARDIAC) PF 100 MG/5ML IV SOSY
PREFILLED_SYRINGE | INTRAVENOUS | Status: DC | PRN
  Administered 2022-08-03: 20 mg via INTRAVENOUS

## 2022-08-03 MED ORDER — LIDOCAINE 2% (20 MG/ML) 5 ML SYRINGE
INTRAMUSCULAR | Status: AC
  Filled 2022-08-03: qty 5

## 2022-08-03 MED ORDER — ACETAMINOPHEN 500 MG PO TABS
1000.0000 mg | ORAL_TABLET | Freq: Four times a day (QID) | ORAL | Status: DC
  Administered 2022-08-03: 1000 mg via ORAL
  Filled 2022-08-03 (×2): qty 2

## 2022-08-03 MED ORDER — MORPHINE SULFATE (PF) 4 MG/ML IV SOLN: 1.0000 mg | INTRAVENOUS | Status: DC | PRN

## 2022-08-03 MED ORDER — CEFAZOLIN SODIUM-DEXTROSE 2-4 GM/100ML-% IV SOLN
2.0000 g | INTRAVENOUS | Status: AC
  Administered 2022-08-03 (×2): 2 g via INTRAVENOUS

## 2022-08-03 MED ORDER — DEXAMETHASONE SODIUM PHOSPHATE 10 MG/ML IJ SOLN
INTRAMUSCULAR | Status: AC
  Filled 2022-08-03: qty 1

## 2022-08-03 MED ORDER — INSULIN ASPART 100 UNIT/ML IJ SOLN: 0.0000 [IU] | Freq: Every day | INTRAMUSCULAR | Status: DC

## 2022-08-03 MED ORDER — PROPOFOL 10 MG/ML IV BOLUS
INTRAVENOUS | Status: DC | PRN
  Administered 2022-08-03: 60 mg via INTRAVENOUS

## 2022-08-03 MED ORDER — TRAMADOL HCL 50 MG PO TABS
50.0000 mg | ORAL_TABLET | Freq: Four times a day (QID) | ORAL | Status: DC | PRN
  Administered 2022-08-03 – 2022-08-04 (×2): 50 mg via ORAL
  Filled 2022-08-03 (×2): qty 1

## 2022-08-03 MED ORDER — FEBUXOSTAT 40 MG PO TABS
40.0000 mg | ORAL_TABLET | Freq: Every day | ORAL | Status: DC
  Filled 2022-08-03: qty 1

## 2022-08-03 MED ORDER — FENTANYL CITRATE (PF) 100 MCG/2ML IJ SOLN: 25.0000 ug | INTRAMUSCULAR | Status: DC | PRN

## 2022-08-03 SURGICAL SUPPLY — 47 items
ADH SKN CLS APL DERMABOND .7 (GAUZE/BANDAGES/DRESSINGS) ×1
APL PRP STRL LF DISP 70% ISPRP (MISCELLANEOUS) ×1
APPLIER CLIP 9.375 MED OPEN (MISCELLANEOUS)
APR CLP MED 9.3 20 MLT OPN (MISCELLANEOUS)
BINDER BREAST 3XL (GAUZE/BANDAGES/DRESSINGS) IMPLANT
BINDER BREAST LRG (GAUZE/BANDAGES/DRESSINGS) IMPLANT
BINDER BREAST MEDIUM (GAUZE/BANDAGES/DRESSINGS) IMPLANT
BINDER BREAST XLRG (GAUZE/BANDAGES/DRESSINGS) IMPLANT
BINDER BREAST XXLRG (GAUZE/BANDAGES/DRESSINGS) IMPLANT
BLADE SURG 15 STRL LF DISP TIS (BLADE) ×1 IMPLANT
BLADE SURG 15 STRL SS (BLADE) ×1
CANISTER SUC SOCK COL 7IN (MISCELLANEOUS) IMPLANT
CANISTER SUCT 1200ML W/VALVE (MISCELLANEOUS) IMPLANT
CHLORAPREP W/TINT 26 (MISCELLANEOUS) ×1 IMPLANT
CLIP APPLIE 9.375 MED OPEN (MISCELLANEOUS) IMPLANT
COVER BACK TABLE 60X90IN (DRAPES) ×1 IMPLANT
COVER MAYO STAND STRL (DRAPES) ×1 IMPLANT
COVER PROBE CYLINDRICAL 5X96 (MISCELLANEOUS) ×1 IMPLANT
DERMABOND ADVANCED .7 DNX12 (GAUZE/BANDAGES/DRESSINGS) ×1 IMPLANT
DRAPE LAPAROSCOPIC ABDOMINAL (DRAPES) ×1 IMPLANT
DRAPE UTILITY XL STRL (DRAPES) ×1 IMPLANT
ELECT REM PT RETURN 9FT ADLT (ELECTROSURGICAL) ×1
ELECTRODE REM PT RTRN 9FT ADLT (ELECTROSURGICAL) ×1 IMPLANT
GAUZE SPONGE 4X4 12PLY STRL LF (GAUZE/BANDAGES/DRESSINGS) IMPLANT
GLOVE SURG SIGNA 7.5 PF LTX (GLOVE) ×1 IMPLANT
GOWN STRL REUS W/ TWL LRG LVL3 (GOWN DISPOSABLE) ×1 IMPLANT
GOWN STRL REUS W/ TWL XL LVL3 (GOWN DISPOSABLE) ×1 IMPLANT
GOWN STRL REUS W/TWL LRG LVL3 (GOWN DISPOSABLE) ×1
GOWN STRL REUS W/TWL XL LVL3 (GOWN DISPOSABLE) ×1
KIT MARKER MARGIN INK (KITS) ×1 IMPLANT
NDL HYPO 25X1 1.5 SAFETY (NEEDLE) ×1 IMPLANT
NEEDLE HYPO 25X1 1.5 SAFETY (NEEDLE) ×1 IMPLANT
NS IRRIG 1000ML POUR BTL (IV SOLUTION) IMPLANT
PACK BASIN DAY SURGERY FS (CUSTOM PROCEDURE TRAY) ×1 IMPLANT
PENCIL SMOKE EVACUATOR (MISCELLANEOUS) ×1 IMPLANT
SLEEVE SCD COMPRESS KNEE MED (STOCKING) ×1 IMPLANT
SPIKE FLUID TRANSFER (MISCELLANEOUS) IMPLANT
SPONGE T-LAP 4X18 ~~LOC~~+RFID (SPONGE) ×1 IMPLANT
SUT MNCRL AB 4-0 PS2 18 (SUTURE) ×1 IMPLANT
SUT SILK 2 0 SH (SUTURE) IMPLANT
SUT VIC AB 3-0 SH 27 (SUTURE) ×2
SUT VIC AB 3-0 SH 27X BRD (SUTURE) ×1 IMPLANT
SYR CONTROL 10ML LL (SYRINGE) ×1 IMPLANT
TOWEL GREEN STERILE FF (TOWEL DISPOSABLE) ×1 IMPLANT
TRAY FAXITRON CT DISP (TRAY / TRAY PROCEDURE) ×1 IMPLANT
TUBE CONNECTING 20X1/4 (TUBING) IMPLANT
YANKAUER SUCT BULB TIP NO VENT (SUCTIONS) IMPLANT

## 2022-08-03 NOTE — Anesthesia Preprocedure Evaluation (Addendum)
Anesthesia Evaluation  Patient identified by MRN, date of birth, ID band Patient awake    Reviewed: Allergy & Precautions, NPO status , Patient's Chart, lab work & pertinent test results, reviewed documented beta blocker date and time   Airway Mallampati: I  TM Distance: >3 FB Neck ROM: Full    Dental  (+) Edentulous Upper, Edentulous Lower, Dental Advisory Given   Pulmonary neg pulmonary ROS   Pulmonary exam normal breath sounds clear to auscultation       Cardiovascular hypertension, Pt. on medications and Pt. on home beta blockers +CHF  Normal cardiovascular exam+ dysrhythmias (eliquis) Atrial Fibrillation + Valvular Problems/Murmurs (mild/mod AS) AS  Rhythm:Regular Rate:Normal  TTE 2022  1. Left ventricular ejection fraction, by estimation, is 60 to 65%. The  left ventricle has normal function. The left ventricle has no regional  wall motion abnormalities. There is moderate left ventricular hypertrophy.  Left ventricular diastolic  parameters are indeterminate.   2. Right ventricular systolic function is normal. The right ventricular  size is normal. Tricuspid regurgitation signal is inadequate for assessing  PA pressure.   3. A small pericardial effusion is present.   4. The mitral valve is degenerative. No evidence of mitral valve  regurgitation. Moderate mitral annular calcification.   5. The aortic valve was not well visualized. There is moderate  calcification of the aortic valve. Aortic valve regurgitation is not  visualized. Mild to moderate aortic valve stenosis. Vmax 2.3 m/s, MG 12  mmHg, AVA 1.5 cm^2, DI 0.45   6. The inferior vena cava is dilated in size with >50% respiratory  variability, suggesting right atrial pressure of 8 mmHg.     Neuro/Psych TIA negative psych ROS   GI/Hepatic negative GI ROS, Neg liver ROS,,,  Endo/Other  diabetes, Type 2, Insulin Dependent    Renal/GU Renal InsufficiencyRenal  disease  negative genitourinary   Musculoskeletal  (+) Arthritis ,    Abdominal   Peds  Hematology negative hematology ROS (+)   Anesthesia Other Findings   Reproductive/Obstetrics                             Anesthesia Physical Anesthesia Plan  ASA: 3  Anesthesia Plan: General   Post-op Pain Management: Tylenol PO (pre-op)*   Induction: Intravenous  PONV Risk Score and Plan: 3 and Ondansetron, Dexamethasone and Treatment may vary due to age or medical condition  Airway Management Planned: LMA  Additional Equipment:   Intra-op Plan:   Post-operative Plan: Extubation in OR  Informed Consent: I have reviewed the patients History and Physical, chart, labs and discussed the procedure including the risks, benefits and alternatives for the proposed anesthesia with the patient or authorized representative who has indicated his/her understanding and acceptance.     Dental advisory given  Plan Discussed with: CRNA  Anesthesia Plan Comments:        Anesthesia Quick Evaluation

## 2022-08-03 NOTE — Interval H&P Note (Signed)
History and Physical Interval Note:no change in H and P  08/03/2022 7:13 AM  Sabrina Baker  has presented today for surgery, with the diagnosis of RIGHT BREAST CANCER.  The various methods of treatment have been discussed with the patient and family. After consideration of risks, benefits and other options for treatment, the patient has consented to  Procedure(s) with comments: RIGHT BREAST LUMPECTOMY WITH RADIOACTIVE SEED LOCALIZATION (Right) - LMA as a surgical intervention.  The patient's history has been reviewed, patient examined, no change in status, stable for surgery.  I have reviewed the patient's chart and labs.  Questions were answered to the patient's satisfaction.     Coralie Keens

## 2022-08-03 NOTE — Transfer of Care (Signed)
Immediate Anesthesia Transfer of Care Note  Patient: Sabrina Baker  Procedure(s) Performed: RIGHT BREAST LUMPECTOMY WITH RADIOACTIVE SEED LOCALIZATION (Right: Breast)  Patient Location: PACU  Anesthesia Type:General  Level of Consciousness: awake, alert , and patient cooperative  Airway & Oxygen Therapy: Patient Spontanous Breathing and Patient connected to face mask oxygen  Post-op Assessment: Report given to RN and Post -op Vital signs reviewed and stable  Post vital signs: Reviewed and stable  Last Vitals:  Vitals Value Taken Time  BP 166/80 08/03/22 0925  Temp    Pulse 67 08/03/22 0931  Resp 19 08/03/22 0931  SpO2 97 % 08/03/22 0931  Vitals shown include unvalidated device data.  Last Pain:  Vitals:   08/03/22 0717  TempSrc: Oral  PainSc: 3       Patients Stated Pain Goal: 3 ([id number])  Complications: No notable events documented.

## 2022-08-03 NOTE — Op Note (Signed)
RIGHT BREAST LUMPECTOMY WITH RADIOACTIVE SEED LOCALIZATION  Procedure Note  Sabrina Baker 08/03/2022   Pre-op Diagnosis: RIGHT BREAST CANCER     Post-op Diagnosis: Same  Procedure(s): RIGHT BREAST LUMPECTOMY WITH RADIOACTIVE SEED LOCALIZATION  Surgeon(s): Coralie Keens, MD  Anesthesia: General  Staff:  Circulator: Maurene Capes, RN Scrub Person: Lorenza Burton, CST  Estimated Blood Loss: Minimal               Specimens: Sent to pathology  Indications: This is an 82 year old female was found of distortion in the right breast on screening mammography.  Ultrasound showed a 1.4 cm mass.  Biopsy showed invasive ductal carcinoma.  The decision was made to proceed with a radioactive seed guided right breast lumpectomy  Procedure: The patient was brought to the operating room and identified as a correct patient.  She was placed upon on the operating room table and general anesthesia was induced.  Her right breast was prepped and draped in the usual sterile fashion.  Using the neoprobe I localized the radioactive seed at the 9 o'clock position laterally in the right breast.  I anesthetized the skin over the area with Marcaine and the performed elliptical incision with a scalpel.  I then dissected circumferentially around the incision down into the breast tissue with the cautery.  With the neoprobe, I stayed widely around the signal from the radioactive seed in all directions and then came under the lumpectomy specimen posteriorly with the cautery.  I then completed the lumpectomy.  I marked all margins with paint.  An x-ray was performed on the specimen confirming that the radioactive seed and previous biopsy clip were in the specimen.  The lumpectomy specimen was then sent to pathology for evaluation.  I achieved hemostasis with the cautery.  I anesthetized the incision further with Marcaine.  We then closed the subcutaneous tissue with interrupted 3-0 Vicryl sutures and closed the skin  with a running 4-0 Monocryl.  Dermabond was then applied.  The patient tolerated the procedure well.  All the counts were correct at the end of the procedure.  The patient was then extubated in the operating room and taken in a stable condition to the recovery room.          Coralie Keens   Date: 08/03/2022  Time: 9:23 AM

## 2022-08-03 NOTE — H&P (Signed)
[facility] PHYSICIAN: [name], [place]  PROVIDER: Beverlee Nims, MD MRN: [id number] DOB: 1940-09-16  Subjective  Chief Complaint: New Consultation (Right Breast Cancer )  History of Present Illness: Sabrina Baker is a 82 y.o. female who is seen as an office consultation for evaluation of New Consultation (Right Breast Cancer )  This is an 82 year old female referred here for the recent diagnosis of a right breast cancer. I actually performed a lumpectomy on her back in 2017 for a mass which ended up being fibrocystic changes in the right breast. Recently, on screening mammography she was found to have a 1.4 cm mass at the 9 o'clock position of the right breast 8 cm from the nipple. She underwent a biopsy showing an invasive ductal carcinoma. It was 90% ER positive, 40% PR positive, HER2 negative, and had a Ki-67 of 25%. She had an ultrasound of the axilla which was unremarkable. She has had no problems regarding her breast. She is now on anticoagulation for A-fib and is followed by cardiology for this. She does have chronic pain issues. She is otherwise without complaints.  Review of Systems: A complete review of systems was obtained from the patient. I have reviewed this information and discussed as appropriate with the patient. See HPI as well for other ROS.  ROS  Medical History: Past Medical History: Diagnosis Date Arthritis Chronic kidney disease Diabetes mellitus without complication (CMS-HCC) GERD (gastroesophageal reflux disease) Glaucoma (increased eye pressure) Hyperlipidemia  There is no problem list on file for this patient.  History reviewed. No pertinent surgical history.  Allergies Allergen Reactions Ramipril Anaphylaxis and Other (See Comments) Valsartan Other (See Comments) Hyperkalemia Other Other (See Comments) Cortisone Itching and Rash Niacin Diarrhea And nausea  Current Outpatient Medications on File Prior to Visit Medication Sig Dispense  Refill atorvastatin (LIPITOR) 10 MG tablet Take 10 mg by mouth once daily bevacizumab (AVASTIN) 25 mg/mL injection Inject into the vein brimonidine (ALPHAGAN) 0.2 % ophthalmic solution Apply 1 drop to eye 2 (two) times daily calcitRIOL (ROCALTROL) 0.25 MCG capsule Take by mouth calcium carbonate-vitamin D3 (OS-CAL 500+D) 500 mg-5 mcg (200 unit) tablet Take 1 tablet by mouth once daily carvediloL (COREG) 25 MG tablet Take by mouth diclofenac (VOLTAREN) 1 % topical gel Apply topically 4 (four) times daily DULoxetine (DRIZALMA SPRINKLE) 60 mg CDRS ELIQUIS 5 mg tablet TAKE 1 TABLET BY MOUTH TWICE A DAY for 90 Febuxostat (ULORIC) 40 mg tablet Take by mouth ferrous sulfate 325 (65 FE) MG tablet Take 1 tablet by mouth 2 (two) times daily FUROsemide (LASIX) 40 MG tablet Take 40 mg by mouth once daily gabapentin (NEURONTIN) 300 MG capsule Take by mouth HYDROcodone-acetaminophen (NORCO) 10-325 mg tablet 1/2 TABLET ORALLY . . .MAY FILL 30 DAYS AFTER LAST SCRIPT THREE TIMES A DAY AS NEEDED FOR PAIN ketoconazole (NIZORAL) 2 % shampoo USE EVERY OTHER DAY EXTERNALLY for 20 latanoprost (XALATAN) 0.005 % ophthalmic solution mupirocin (BACTROBAN) 2 % ointment 1 application to affected area Externally twice a day As needed nateglinide (STARLIX) 120 MG tablet Take by mouth pantoprazole (PROTONIX) 40 MG DR tablet 1 tablet Orally twice a day Dx: K21.9 for 90 days TOUJEO SOLOSTAR U-300 INSULIN pen injector (concentration 300 units/mL) INJECT 18 UNITS INTO THE SKIN DAILY. triamcinolone 0.1 % cream 1 application to affected area Externally Twice a day as needed for 30 days TRULICITY 3 [id number] mL subcutaneous pen injector 3 ml Subcutaneous once a week  No current facility-administered medications on file prior to visit.  History reviewed. No  pertinent family history.  Social History  Tobacco Use Smoking Status Never Smokeless Tobacco Never   Social History  Socioeconomic History Marital status:  Divorced Tobacco Use Smoking status: Never Smokeless tobacco: Never Substance and Sexual Activity Alcohol use: Not Currently Drug use: Never  Objective:  Vitals:  BP: (!) 154/77 Pulse: 87 Temp: 36.2 C (97.1 F) SpO2: 96% Weight: 61.2 kg (135 lb) Height: 157.5 cm (5' 2"$ )  Body mass index is 24.69 kg/m.  Physical Exam  She appears well on exam  I cannot palpate the mass in the breast secondary to some ecchymosis and bruising from her biopsy. The nipple areolar complex is normal. There is no axillary adenopathy.  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 From a surgical standpoint we did discussed a radioactive seed guided lumpectomy versus mastectomy and the long-term results of each. She is interested in breast conservation. We discussed the surgical procedure in detail. We discussed the risks which includes but is not limited to bleeding, infection, injury to surrounding structures, the need for further surgery if margins are positive, cardiopulmonary issues, postoperative recovery, etc.  She will need cardiac clearance prior to surgery and will need to stop her Eliquis 2 days preoperatively  We will also refer her to medical and radiation oncology for care after surgery.

## 2022-08-03 NOTE — Discharge Instructions (Addendum)
[facility] Office Phone Number [phone]  BREAST BIOPSY/ PARTIAL MASTECTOMY: POST OP INSTRUCTIONS  Always review your discharge instruction sheet given to you by the facility where your surgery was performed.  IF YOU HAVE DISABILITY OR FAMILY LEAVE FORMS, YOU MUST BRING THEM TO THE OFFICE FOR PROCESSING.  DO NOT GIVE THEM TO YOUR DOCTOR.  A prescription for pain medication may be given to you upon discharge.  Take your pain medication as prescribed, if needed.  If narcotic pain medicine is not needed, then you may take acetaminophen (Tylenol) or ibuprofen (Advil) as needed. Take your usually prescribed medications unless otherwise directed If you need a refill on your pain medication, please contact your pharmacy.  They will contact our office to request authorization.  Prescriptions will not be filled after 5pm or on week-ends. You should eat very light the first 24 hours after surgery, such as soup, crackers, pudding, etc.  Resume your normal diet the day after surgery. Most patients will experience some swelling and bruising in the breast.  Ice packs and a good support bra will help.  Swelling and bruising can take several days to resolve.  It is common to experience some constipation if taking pain medication after surgery.  Increasing fluid intake and taking a stool softener will usually help or prevent this problem from occurring.  A mild laxative (Milk of Magnesia or Miralax) should be taken according to package directions if there are no bowel movements after 48 hours. Unless discharge instructions indicate otherwise, you may remove your bandages 24-48 hours after surgery, and you may shower at that time.  You may have steri-strips (small skin tapes) in place directly over the incision.  These strips should be left on the skin for 7-10 days.  If your surgeon used skin glue on the incision, you may shower in 24 hours.  The glue will flake off over the next 2-3 weeks.  Any  sutures or staples will be removed at the office during your follow-up visit. ACTIVITIES:  You may resume regular daily activities (gradually increasing) beginning the next day.  Wearing a good support bra or sports bra minimizes pain and swelling.  You may have sexual intercourse when it is comfortable. You may drive when you no longer are taking prescription pain medication, you can comfortably wear a seatbelt, and you can safely maneuver your car and apply brakes. RETURN TO WORK:  ______________________________________________________________________________________ You should see your doctor in the office for a follow-up appointment approximately two weeks after your surgery.  Your doctor's nurse will typically make your follow-up appointment when she calls you with your pathology report.  Expect your pathology report 2-3 business days after your surgery.  You may call to check if you do not hear from us after three days. OTHER INSTRUCTIONS: _______________________________________________________________________________________________ _____________________________________________________________________________________________________________________________________ _____________________________________________________________________________________________________________________________________ _____________________________________________________________________________________________________________________________________  WHEN TO CALL YOUR DOCTOR: Fever over 101.0 Nausea and/or vomiting. Extreme swelling or bruising. Continued bleeding from incision. Increased pain, redness, or drainage from the incision.  The clinic staff is available to answer your questions during regular business hours.  Please don't hesitate to call and ask to speak to one of the nurses for clinical concerns.  If you have a medical emergency, go to the nearest emergency room or call 911.  A surgeon from [facility] is always on call at the hospital.  For further questions, please visit [email or web address]    Post Anesthesia Home Care Instructions  Activity: Get plenty of rest for the remainder of   the day. A responsible individual must stay with you for 24 hours following the procedure.  For the next 24 hours, DO NOT: -Drive a car -Operate machinery -Drink alcoholic beverages -Take any medication unless instructed by your physician -Make any legal decisions or sign important papers.  Meals: Start with liquid foods such as gelatin or soup. Progress to regular foods as tolerated. Avoid greasy, spicy, heavy foods. If nausea and/or vomiting occur, drink only clear liquids until the nausea and/or vomiting subsides. Call your physician if vomiting continues.  Special Instructions/Symptoms: Your throat may feel dry or sore from the anesthesia or the breathing tube placed in your throat during surgery. If this causes discomfort, gargle with warm salt water. The discomfort should disappear within 24 hours.  If you had a scopolamine patch placed behind your ear for the management of post- operative nausea and/or vomiting:  1. The medication in the patch is effective for 72 hours, after which it should be removed.  Wrap patch in a tissue and discard in the trash. Wash hands thoroughly with soap and water. 2. You may remove the patch earlier than 72 hours if you experience unpleasant side effects which may include dry mouth, dizziness or visual disturbances. 3. Avoid touching the patch. Wash your hands with soap and water after contact with the patch.      

## 2022-08-03 NOTE — Anesthesia Postprocedure Evaluation (Signed)
Anesthesia Post Note  Patient: Sabrina Baker  Procedure(s) Performed: RIGHT BREAST LUMPECTOMY WITH RADIOACTIVE SEED LOCALIZATION (Right: Breast)     Patient location during evaluation: PACU Anesthesia Type: General Level of consciousness: awake and alert Pain management: pain level controlled Vital Signs Assessment: post-procedure vital signs reviewed and stable Respiratory status: spontaneous breathing, nonlabored ventilation, respiratory function stable and patient connected to nasal cannula oxygen Cardiovascular status: blood pressure returned to baseline and stable Postop Assessment: no apparent nausea or vomiting Anesthetic complications: no  No notable events documented.  Last Vitals:  Vitals:   08/03/22 1030 08/03/22 1245  BP: (!) 178/94   Pulse: 65   Resp: 16   Temp: 36.7 C   SpO2: 96% 99%    Last Pain:  Vitals:   08/03/22 1030  TempSrc:   PainSc: 2                  Danesha Kirchoff L Breunna Nordmann

## 2022-08-03 NOTE — Anesthesia Procedure Notes (Signed)
Procedure Name: LMA Insertion Date/Time: 08/03/2022 8:59 AM  Performed by: Verita Lamb, CRNAPre-anesthesia Checklist: Patient identified, Emergency Drugs available, Suction available and Patient being monitored Patient Re-evaluated:Patient Re-evaluated prior to induction Oxygen Delivery Method: Circle system utilized Preoxygenation: Pre-oxygenation with 100% oxygen Induction Type: IV induction Ventilation: Mask ventilation without difficulty LMA: LMA inserted LMA Size: 4.0 Number of attempts: 1 Airway Equipment and Method: Bite block Placement Confirmation: positive ETCO2, CO2 detector and breath sounds checked- equal and bilateral Tube secured with: Tape Dental Injury: Teeth and Oropharynx as per pre-operative assessment

## 2022-08-04 ENCOUNTER — Encounter (HOSPITAL_BASED_OUTPATIENT_CLINIC_OR_DEPARTMENT_OTHER): Payer: Self-pay | Admitting: Surgery

## 2022-08-04 DIAGNOSIS — C50811 Malignant neoplasm of overlapping sites of right female breast: Secondary | ICD-10-CM | POA: Diagnosis not present

## 2022-08-04 DIAGNOSIS — I5031 Acute diastolic (congestive) heart failure: Secondary | ICD-10-CM | POA: Diagnosis not present

## 2022-08-04 DIAGNOSIS — E1122 Type 2 diabetes mellitus with diabetic chronic kidney disease: Secondary | ICD-10-CM | POA: Diagnosis not present

## 2022-08-04 DIAGNOSIS — I4891 Unspecified atrial fibrillation: Secondary | ICD-10-CM | POA: Diagnosis not present

## 2022-08-04 DIAGNOSIS — Z170 Estrogen receptor positive status [ER+]: Secondary | ICD-10-CM | POA: Diagnosis not present

## 2022-08-04 DIAGNOSIS — N6011 Diffuse cystic mastopathy of right breast: Secondary | ICD-10-CM | POA: Diagnosis not present

## 2022-08-04 DIAGNOSIS — N6021 Fibroadenosis of right breast: Secondary | ICD-10-CM | POA: Diagnosis not present

## 2022-08-04 DIAGNOSIS — I130 Hypertensive heart and chronic kidney disease with heart failure and stage 1 through stage 4 chronic kidney disease, or unspecified chronic kidney disease: Secondary | ICD-10-CM | POA: Diagnosis not present

## 2022-08-04 DIAGNOSIS — Z7901 Long term (current) use of anticoagulants: Secondary | ICD-10-CM | POA: Diagnosis not present

## 2022-08-04 DIAGNOSIS — N189 Chronic kidney disease, unspecified: Secondary | ICD-10-CM | POA: Diagnosis not present

## 2022-08-04 DIAGNOSIS — Z794 Long term (current) use of insulin: Secondary | ICD-10-CM | POA: Diagnosis not present

## 2022-08-04 DIAGNOSIS — G8929 Other chronic pain: Secondary | ICD-10-CM | POA: Diagnosis not present

## 2022-08-04 LAB — GLUCOSE, CAPILLARY
Glucose-Capillary: 315 mg/dL — ABNORMAL HIGH (ref 70–99)
Glucose-Capillary: 77 mg/dL (ref 70–99)

## 2022-08-04 NOTE — Discharge Summary (Signed)
Physician Discharge Summary  Patient ID: Sabrina Baker MRN: [id number] DOB/AGE: [birth year] 82 y.o.  Admit date: 08/03/2022 Discharge date: 08/04/2022  Admission Diagnoses:  Discharge Diagnoses:  Principal Problem:   S/P lumpectomy, right breast   Discharged Condition: good  Hospital Course: uneventful post op recovery.  Discharged home POD#1  Consults: None  Significant Diagnostic Studies:   Treatments: surgery: radioactive seed guided left breast lumpectomy  Discharge Exam: Blood pressure (!) 175/73, pulse 76, temperature 98.1 F (36.7 C), resp. rate 16, height '5\' 2"'$  (1.575 m), weight 63 kg, SpO2 100 %. General appearance: alert, cooperative, and no distress Resp: clear to auscultation bilaterally Cardio: regular rate and rhythm, S1, S2 normal, no murmur, click, rub or gallop Incision/Wound:incision healing well  Disposition: Discharge disposition: 01-Home or Self Care        Allergies as of 08/04/2022       Reactions   Ramipril Anaphylaxis, Other (See Comments)   Cortisone Itching, Rash   Niacin Diarrhea   And nausea   Niacin And Related Diarrhea   Valsartan Other (See Comments)   Hyperkalemia        Medication List     TAKE these medications    acetaminophen 650 MG CR tablet Commonly known as: TYLENOL Take 1,300 mg by mouth every 8 (eight) hours as needed for pain.   apixaban 2.5 MG Tabs tablet Commonly known as: Eliquis Take 1 tablet (2.5 mg total) by mouth 2 (two) times daily.   atorvastatin 10 MG tablet Commonly known as: LIPITOR Take 10 mg by mouth daily.   bevacizumab 400 MG/16ML Soln Commonly known as: AVASTIN Inject into the vein.   brimonidine 0.2 % ophthalmic solution Commonly known as: ALPHAGAN Place 1 drop into the left eye 2 (two) times daily.   calcitRIOL 0.25 MCG capsule Commonly known as: ROCALTROL Take 0.25 mcg by mouth 3 (three) times a week. Mon, Wed, Friday   calcium-vitamin D 500-200 MG-UNIT Tabs tablet Commonly  known as: OSCAL WITH D Take 1 tablet by mouth daily.   carvedilol 25 MG tablet Commonly known as: COREG Take 25 mg by mouth 2 (two) times daily with a meal.   colchicine 0.6 MG tablet Take 0.6 mg by mouth daily.   cyanocobalamin 100 MCG tablet Take 100 mcg by mouth daily.   diclofenac Sodium 1 % Gel Commonly known as: VOLTAREN Apply topically 4 (four) times daily.   DULoxetine 60 MG capsule Commonly known as: CYMBALTA Take 60 mg by mouth daily.   febuxostat 40 MG tablet Commonly known as: ULORIC Take 40 mg by mouth daily. One tab a day   furosemide 40 MG tablet Commonly known as: Lasix Take 1 tablet (40 mg total) by mouth daily.   gabapentin 300 MG capsule Commonly known as: Neurontin Take 1 capsule (300 mg total) by mouth 2 (two) times daily. Started with 300 mg nightly x 1 week, then increase to 300 mg BID or 60 '0mg'$  QHS- for nerve pain   GlucoCom Blood Glucose Monitor Devi Use to test blood sugar 3 times a day (E11.65)   HYDROcodone-acetaminophen 5-325 MG tablet Commonly known as: NORCO/VICODIN Take 1 tablet by mouth every 6 (six) hours as needed for moderate pain. Will increase back to 100 tabs max/month- for chronic pain   Insulin Pen Needle 32G X 4 MM Misc USE TO INJECT INSULIN ONCE A DAY   Iron 325 (65 Fe) MG Tabs Take 1 tablet by mouth 2 (two) times daily.   ketoconazole 2 % shampoo  Commonly known as: NIZORAL Apply 1 application topically 2 (two) times a week.   Lancets 30G Misc Use to test blood sugars 3 times a day (Dx: E11.65)   nateglinide 120 MG tablet Commonly known as: STARLIX Take 60 mg by mouth 3 (three) times daily as needed. If CBG is above 110   OneTouch Verio test strip Generic drug: glucose blood USE AS DIRECTED. TESTING FREQUENCY  3 X/DAILY (DX E11.65)   pantoprazole 40 MG tablet Commonly known as: PROTONIX 1 tablet Orally twice a day Dx: K21.9 for 90 days   Toujeo Max SoloStar 300 UNIT/ML Solostar Pen Generic drug: insulin  glargine (2 Unit Dial) Inject 10 Units into the skin daily. What changed: how much to take   triamcinolone cream 0.1 % Commonly known as: KENALOG Apply 1 application topically 2 (two) times daily as needed (For rash).   trimethoprim-polymyxin b ophthalmic solution Commonly known as: POLYTRIM Use 4 times in left eye the day of injection, 4 drop the following day.   Trulicity 1.5 0000000 Sopn Generic drug: Dulaglutide Inject 3 mg into the skin every 7 (seven) days. Friday         Signed: Coralie Keens 08/04/2022, 6:44 AM

## 2022-08-07 ENCOUNTER — Telehealth: Payer: Self-pay | Admitting: Hematology and Oncology

## 2022-08-07 LAB — SURGICAL PATHOLOGY

## 2022-08-07 NOTE — Telephone Encounter (Signed)
Per 2/26 IB reached out to patient to schedule, left voicemail for patient to call back.

## 2022-08-07 NOTE — Telephone Encounter (Signed)
Patient called to schedule post op. Patient aware of date and time of appointment.

## 2022-08-08 ENCOUNTER — Encounter: Payer: Self-pay | Admitting: *Deleted

## 2022-08-10 ENCOUNTER — Encounter: Payer: Self-pay | Admitting: Podiatry

## 2022-08-10 NOTE — Progress Notes (Signed)
Patient Care Team: [name], MD as PCP - General (Family Medicine) Nahser, Wonda Cheng, MD as PCP - Cardiology (Cardiology) Mauro Kaufmann, RN as Oncology Nurse Navigator Rockwell Germany, RN as Oncology Nurse Navigator Nicholas Lose, MD as Consulting Physician (Hematology and Oncology)  DIAGNOSIS: No diagnosis found.  SUMMARY OF ONCOLOGIC HISTORY: Oncology History  Primary malignant neoplasm of upper outer quadrant of right breast ([place])  07/13/2022 Initial Diagnosis   Screening mammogram detected possible mass Rt Breast Right 1.4 cm, Axilla Neg, breast Biopsy: Grade 1 IDC with LG DCIS, ER 90%, PR: 40%, Ki 67: 25%, Her 2: 1+ (Neg)   07/13/2022 Cancer Staging   Staging form: Breast, AJCC 8th Edition - Clinical stage from 07/13/2022: Stage IA (cT1c, cN0, cM0, G1, ER+, PR+, HER2-) - Signed by Hayden Pedro, PA-C on 07/13/2022 Stage prefix: Initial diagnosis Method of lymph node assessment: Clinical Histologic grading system: 3 grade system     CHIEF COMPLIANT: Follow-up after surgery  INTERVAL HISTORY: Sabrina Baker is a 82 y.o. female is here because of recent diagnosis of right breast cancer.   ALLERGIES:  is allergic to ramipril, cortisone, niacin, niacin and related, and valsartan.  MEDICATIONS:  Current Outpatient Medications  Medication Sig Dispense Refill   acetaminophen (TYLENOL) 650 MG CR tablet Take 1,300 mg by mouth every 8 (eight) hours as needed for pain.     apixaban (ELIQUIS) 2.5 MG TABS tablet Take 1 tablet (2.5 mg total) by mouth 2 (two) times daily. 180 tablet 1   atorvastatin (LIPITOR) 10 MG tablet Take 10 mg by mouth daily.     bevacizumab (AVASTIN) 400 MG/16ML SOLN Inject into the vein.     Blood Glucose Monitoring Suppl (GLUCOCOM BLOOD GLUCOSE MONITOR) DEVI Use to test blood sugar 3 times a day (E11.65)     brimonidine (ALPHAGAN) 0.2 % ophthalmic solution Place 1 drop into the left eye 2 (two) times daily.     calcitRIOL (ROCALTROL) 0.25 MCG  capsule Take 0.25 mcg by mouth 3 (three) times a week. Mon, Wed, Friday     calcium-vitamin D (OSCAL WITH D) 500-200 MG-UNIT TABS tablet Take 1 tablet by mouth daily.      carvedilol (COREG) 25 MG tablet Take 25 mg by mouth 2 (two) times daily with a meal.     colchicine 0.6 MG tablet Take 0.6 mg by mouth daily.     cyanocobalamin 100 MCG tablet Take 100 mcg by mouth daily.     diclofenac Sodium (VOLTAREN) 1 % GEL Apply topically 4 (four) times daily.     DULoxetine (CYMBALTA) 60 MG capsule Take 60 mg by mouth daily.     febuxostat (ULORIC) 40 MG tablet Take 40 mg by mouth daily. One tab a day     Ferrous Sulfate (IRON) 325 (65 FE) MG TABS Take 1 tablet by mouth 2 (two) times daily.     furosemide (LASIX) 40 MG tablet Take 1 tablet (40 mg total) by mouth daily. 30 tablet 0   gabapentin (NEURONTIN) 300 MG capsule Take 1 capsule (300 mg total) by mouth 2 (two) times daily. Started with 300 mg nightly x 1 week, then increase to 300 mg BID or 60 '0mg'$  QHS- for nerve pain 60 capsule 5   HYDROcodone-acetaminophen (NORCO/VICODIN) 5-325 MG tablet Take 1 tablet by mouth every 6 (six) hours as needed for moderate pain. Will increase back to 100 tabs max/month- for chronic pain 100 tablet 0   insulin glargine, 2 Unit Dial, (  TOUJEO MAX SOLOSTAR) 300 UNIT/ML Solostar Pen Inject 10 Units into the skin daily. (Patient taking differently: Inject 18 Units into the skin daily.)     Insulin Pen Needle 32G X 4 MM MISC USE TO INJECT INSULIN ONCE A DAY     ketoconazole (NIZORAL) 2 % shampoo Apply 1 application topically 2 (two) times a week.      Lancets 30G MISC Use to test blood sugars 3 times a day (Dx: E11.65)     nateglinide (STARLIX) 120 MG tablet Take 60 mg by mouth 3 (three) times daily as needed. If CBG is above 110     ONETOUCH VERIO test strip USE AS DIRECTED. TESTING FREQUENCY  3 X/DAILY (DX E11.65)     pantoprazole (PROTONIX) 40 MG tablet 1 tablet Orally twice a day Dx: K21.9 for 90 days     triamcinolone  cream (KENALOG) 0.1 % Apply 1 application topically 2 (two) times daily as needed (For rash).      trimethoprim-polymyxin b (POLYTRIM) ophthalmic solution Use 4 times in left eye the day of injection, 4 drop the following day.     TRULICITY 1.5 0000000 SOPN Inject 3 mg into the skin every 7 (seven) days. Friday     No current facility-administered medications for this visit.    PHYSICAL EXAMINATION: ECOG PERFORMANCE STATUS: {CHL ONC ECOG [phone]}  There were no vitals filed for this visit. There were no vitals filed for this visit.  BREAST:*** No palpable masses or nodules in either right or left breasts. No palpable axillary supraclavicular or infraclavicular adenopathy no breast tenderness or nipple discharge. (exam performed in the presence of a chaperone)  LABORATORY DATA:  I have reviewed the data as listed    Latest Ref Rng & Units 07/31/2022    9:43 AM 03/22/2021    3:54 AM 03/21/2021    3:56 AM  CMP  Glucose 70 - 99 mg/dL 159  137  167   BUN 8 - 23 mg/dL 38  16  12   Creatinine 0.44 - 1.00 mg/dL 1.49  1.15  0.98   Sodium 135 - 145 mmol/L 136  139  139   Potassium 3.5 - 5.1 mmol/L 4.7  4.5  4.0   Chloride 98 - 111 mmol/L 95  96  96   CO2 22 - 32 mmol/L 31  35  34   Calcium 8.9 - 10.3 mg/dL 9.1  8.3  8.3   Total Protein 6.5 - 8.1 g/dL  5.6  5.7   Total Bilirubin 0.3 - 1.2 mg/dL  0.5  0.8   Alkaline Phos 38 - 126 U/L  64  56   AST 15 - 41 U/L  20  18   ALT 0 - 44 U/L  17  15     Lab Results  Component Value Date   WBC 8.8 07/05/2022   HGB 13.3 07/05/2022   HCT 39.6 07/05/2022   MCV 99.0 07/05/2022   PLT 276 07/05/2022   NEUTROABS 6,072 07/05/2022    ASSESSMENT & PLAN:  No problem-specific Assessment & Plan notes found for this encounter.    No orders of the defined types were placed in this encounter.  The patient has a good understanding of the overall plan. she agrees with it. she will call with any problems that may develop before the next visit  here. Total time spent: 30 mins including face to face time and time spent for planning, charting and co-ordination of care   Azai Gaffin L  Lannie Fields, [place] 08/10/22    I Gardiner Coins am acting as a Education administrator for [employer]  ***

## 2022-08-14 ENCOUNTER — Other Ambulatory Visit: Payer: Self-pay

## 2022-08-14 ENCOUNTER — Inpatient Hospital Stay: Payer: 59 | Attending: Hematology and Oncology | Admitting: Hematology and Oncology

## 2022-08-14 VITALS — BP 133/69 | HR 78 | Temp 97.9°F | Resp 14 | Wt 137.7 lb

## 2022-08-14 DIAGNOSIS — Z170 Estrogen receptor positive status [ER+]: Secondary | ICD-10-CM | POA: Insufficient documentation

## 2022-08-14 DIAGNOSIS — C50411 Malignant neoplasm of upper-outer quadrant of right female breast: Secondary | ICD-10-CM | POA: Insufficient documentation

## 2022-08-14 DIAGNOSIS — Z79811 Long term (current) use of aromatase inhibitors: Secondary | ICD-10-CM | POA: Insufficient documentation

## 2022-08-14 DIAGNOSIS — Z79899 Other long term (current) drug therapy: Secondary | ICD-10-CM | POA: Diagnosis not present

## 2022-08-14 MED ORDER — ANASTROZOLE 1 MG PO TABS: 1.0000 mg | ORAL_TABLET | Freq: Every day | ORAL | 3 refills | Status: DC

## 2022-08-14 NOTE — Assessment & Plan Note (Signed)
08/03/2022:Right lumpectomy: Grade 1 IDC 1.4 cm with focal DCIS, margins negative, PASH, ER 90%, PR 40%, HER2 1+ negative, Ki-67 25%  Pathology counseling: I discussed the final pathology report of the patient provided  a copy of this report. I discussed the margins as well as lymph node surgeries. We also discussed the final staging along with previously performed ER/PR and HER-2/neu testing.  Treatment plan: Antiestrogen therapy (patient did not want radiation)  Anastrozole counseling: We discussed the risks and benefits of anti-estrogen therapy with aromatase inhibitors. These include but not limited to insomnia, hot flashes, mood changes, vaginal dryness, bone density loss, and weight gain. We strongly believe that the benefits far outweigh the risks. Patient understands these risks and consented to starting treatment. Planned treatment duration is 5 years.  Return to clinic in 3 months for survivorship care plan visit

## 2022-08-16 ENCOUNTER — Telehealth: Payer: Self-pay | Admitting: *Deleted

## 2022-08-16 ENCOUNTER — Telehealth: Payer: Self-pay | Admitting: Hematology and Oncology

## 2022-08-16 ENCOUNTER — Telehealth: Payer: Self-pay | Admitting: Radiation Oncology

## 2022-08-16 NOTE — Telephone Encounter (Signed)
Received VM from pt requesting to schedule yearly f/u for 10/27/22.  RN sent message to scheduling team to arrange appt.

## 2022-08-16 NOTE — Telephone Encounter (Signed)
Scheduled appointment per 3/4 los. Patient is aware of the made appointment.

## 2022-08-16 NOTE — Telephone Encounter (Signed)
I called the patient today to review her pathology findings were reviewed by Dr. Lisbeth Renshaw and he feels she has the ability to forgo radiation if she desires based on clear margins and T1c disease, with clinically negative nodes and plans to proceed with antiestrogen therapy.***

## 2022-08-21 ENCOUNTER — Encounter (INDEPENDENT_AMBULATORY_CARE_PROVIDER_SITE_OTHER): Payer: 59 | Admitting: Ophthalmology

## 2022-08-21 DIAGNOSIS — H353122 Nonexudative age-related macular degeneration, left eye, intermediate dry stage: Secondary | ICD-10-CM | POA: Diagnosis not present

## 2022-08-21 DIAGNOSIS — H338 Other retinal detachments: Secondary | ICD-10-CM | POA: Diagnosis not present

## 2022-08-21 DIAGNOSIS — H43812 Vitreous degeneration, left eye: Secondary | ICD-10-CM

## 2022-08-21 DIAGNOSIS — H35033 Hypertensive retinopathy, bilateral: Secondary | ICD-10-CM | POA: Diagnosis not present

## 2022-08-21 DIAGNOSIS — H35372 Puckering of macula, left eye: Secondary | ICD-10-CM | POA: Diagnosis not present

## 2022-08-21 DIAGNOSIS — I10 Essential (primary) hypertension: Secondary | ICD-10-CM

## 2022-08-21 DIAGNOSIS — E113312 Type 2 diabetes mellitus with moderate nonproliferative diabetic retinopathy with macular edema, left eye: Secondary | ICD-10-CM | POA: Diagnosis not present

## 2022-08-21 DIAGNOSIS — E113511 Type 2 diabetes mellitus with proliferative diabetic retinopathy with macular edema, right eye: Secondary | ICD-10-CM | POA: Diagnosis not present

## 2022-08-23 ENCOUNTER — Other Ambulatory Visit: Payer: Self-pay

## 2022-08-23 ENCOUNTER — Encounter: Payer: Self-pay | Admitting: *Deleted

## 2022-08-23 DIAGNOSIS — C50411 Malignant neoplasm of upper-outer quadrant of right female breast: Secondary | ICD-10-CM

## 2022-08-23 MED ORDER — HYDROCODONE-ACETAMINOPHEN 5-325 MG PO TABS: 1.0000 | ORAL_TABLET | Freq: Four times a day (QID) | ORAL | 0 refills | Status: DC | PRN

## 2022-08-31 DIAGNOSIS — G894 Chronic pain syndrome: Secondary | ICD-10-CM | POA: Diagnosis not present

## 2022-08-31 DIAGNOSIS — M461 Sacroiliitis, not elsewhere classified: Secondary | ICD-10-CM | POA: Diagnosis not present

## 2022-08-31 DIAGNOSIS — I130 Hypertensive heart and chronic kidney disease with heart failure and stage 1 through stage 4 chronic kidney disease, or unspecified chronic kidney disease: Secondary | ICD-10-CM | POA: Diagnosis not present

## 2022-08-31 DIAGNOSIS — N183 Chronic kidney disease, stage 3 unspecified: Secondary | ICD-10-CM | POA: Diagnosis not present

## 2022-08-31 DIAGNOSIS — E785 Hyperlipidemia, unspecified: Secondary | ICD-10-CM | POA: Diagnosis not present

## 2022-08-31 DIAGNOSIS — I480 Paroxysmal atrial fibrillation: Secondary | ICD-10-CM | POA: Diagnosis not present

## 2022-08-31 DIAGNOSIS — I5032 Chronic diastolic (congestive) heart failure: Secondary | ICD-10-CM | POA: Diagnosis not present

## 2022-08-31 DIAGNOSIS — E1122 Type 2 diabetes mellitus with diabetic chronic kidney disease: Secondary | ICD-10-CM | POA: Diagnosis not present

## 2022-08-31 DIAGNOSIS — L219 Seborrheic dermatitis, unspecified: Secondary | ICD-10-CM | POA: Diagnosis not present

## 2022-08-31 DIAGNOSIS — Z794 Long term (current) use of insulin: Secondary | ICD-10-CM | POA: Diagnosis not present

## 2022-08-31 DIAGNOSIS — I700 Atherosclerosis of aorta: Secondary | ICD-10-CM | POA: Diagnosis not present

## 2022-08-31 DIAGNOSIS — D6869 Other thrombophilia: Secondary | ICD-10-CM | POA: Diagnosis not present

## 2022-09-04 ENCOUNTER — Telehealth: Payer: Self-pay

## 2022-09-04 NOTE — Telephone Encounter (Signed)
Patient has an allergy for cortisone and she is schedule Thursday for an B/L SI injection, is she going to be able to get this procedure done ?

## 2022-09-05 ENCOUNTER — Telehealth: Payer: Self-pay | Admitting: Podiatry

## 2022-09-05 NOTE — Telephone Encounter (Signed)
Lmom for patient to call back to schedule picking up diabetic shoes

## 2022-09-06 ENCOUNTER — Telehealth: Payer: Self-pay | Admitting: *Deleted

## 2022-09-06 NOTE — Telephone Encounter (Signed)
Called this morning to verify if she needs to stop medications before injection tomorrow. She is on Eliquis but is having bilateral SI injections. She will not have to stop the medication for this procedure.

## 2022-09-07 ENCOUNTER — Encounter: Payer: 59 | Admitting: Physical Medicine & Rehabilitation

## 2022-09-07 ENCOUNTER — Telehealth: Payer: Self-pay

## 2022-09-07 VITALS — BP 89/56 | HR 68 | Temp 98.3°F | Ht 62.0 in | Wt 137.0 lb

## 2022-09-07 DIAGNOSIS — Z79899 Other long term (current) drug therapy: Secondary | ICD-10-CM | POA: Diagnosis not present

## 2022-09-07 DIAGNOSIS — M533 Sacrococcygeal disorders, not elsewhere classified: Secondary | ICD-10-CM

## 2022-09-07 DIAGNOSIS — M48062 Spinal stenosis, lumbar region with neurogenic claudication: Secondary | ICD-10-CM

## 2022-09-07 DIAGNOSIS — Z859 Personal history of malignant neoplasm, unspecified: Secondary | ICD-10-CM | POA: Diagnosis not present

## 2022-09-07 DIAGNOSIS — R5383 Other fatigue: Secondary | ICD-10-CM | POA: Diagnosis not present

## 2022-09-07 DIAGNOSIS — I4891 Unspecified atrial fibrillation: Secondary | ICD-10-CM | POA: Diagnosis not present

## 2022-09-07 DIAGNOSIS — R531 Weakness: Secondary | ICD-10-CM | POA: Diagnosis not present

## 2022-09-07 DIAGNOSIS — Z7901 Long term (current) use of anticoagulants: Secondary | ICD-10-CM | POA: Diagnosis not present

## 2022-09-07 DIAGNOSIS — I10 Essential (primary) hypertension: Secondary | ICD-10-CM | POA: Diagnosis not present

## 2022-09-07 DIAGNOSIS — E860 Dehydration: Secondary | ICD-10-CM | POA: Diagnosis not present

## 2022-09-07 NOTE — Progress Notes (Unsigned)
  PROCEDURE RECORD [facility]   Name: Sabrina Baker DOB:[birth year] MRN: [id number]  Date:09/07/2022  Physician: Alysia Penna, MD    Nurse/CMA: ***  Allergies:  Allergies  Allergen Reactions   Ramipril Anaphylaxis and Other (See Comments)   Cortisone Itching and Rash   Niacin Diarrhea    And nausea   Niacin And Related Diarrhea   Valsartan Other (See Comments)    Hyperkalemia     Consent Signed: Yes.    Is patient diabetic? Yes.    CBG today? 107  Pregnant: No. LMP: No LMP recorded. Patient is postmenopausal. (age 78-55)  Anticoagulants: yes (eliquis) Anti-inflammatory: no Antibiotics: no  Procedure: Bilateral Sacroiliac Joint Injection  Position: Prone Start Time: ***  End Time: ***  Fluoro Time: ***  RN/CMA Maxime Beckner, CMA     Time 11:06 am     BP 90/53     Pulse 81     Respirations 16     O2 Sat 94     S/S 6 6    Pain Level 8/10      D/C home with friend, patient A & O X 3, D/C instructions reviewed, and sits independently.

## 2022-09-07 NOTE — Telephone Encounter (Signed)
Patient came in today for an injection. During vital sign check her BP was taken 3 different times and all 3 times it was low. She stated she felt weak and had to sit down right away. She was given a piece of candy because she said she felt like her sugar dropped. She was advised to go to the Urgent Care across the street to be checked out.

## 2022-09-11 DIAGNOSIS — I10 Essential (primary) hypertension: Secondary | ICD-10-CM | POA: Diagnosis not present

## 2022-09-11 DIAGNOSIS — E1165 Type 2 diabetes mellitus with hyperglycemia: Secondary | ICD-10-CM | POA: Diagnosis not present

## 2022-09-14 ENCOUNTER — Encounter (HOSPITAL_COMMUNITY): Payer: Self-pay

## 2022-09-15 ENCOUNTER — Encounter: Payer: 59 | Attending: Physical Medicine and Rehabilitation | Admitting: Physical Medicine & Rehabilitation

## 2022-09-15 ENCOUNTER — Encounter: Payer: Self-pay | Admitting: Physical Medicine & Rehabilitation

## 2022-09-15 VITALS — Ht 62.0 in | Wt 141.0 lb

## 2022-09-15 DIAGNOSIS — M48062 Spinal stenosis, lumbar region with neurogenic claudication: Secondary | ICD-10-CM | POA: Diagnosis not present

## 2022-09-15 DIAGNOSIS — G8929 Other chronic pain: Secondary | ICD-10-CM | POA: Diagnosis not present

## 2022-09-15 DIAGNOSIS — M533 Sacrococcygeal disorders, not elsewhere classified: Secondary | ICD-10-CM | POA: Diagnosis not present

## 2022-09-15 DIAGNOSIS — M4800 Spinal stenosis, site unspecified: Secondary | ICD-10-CM | POA: Diagnosis not present

## 2022-09-15 MED ORDER — IOHEXOL 180 MG/ML  SOLN
3.0000 mL | Freq: Once | INTRAMUSCULAR | Status: DC
Start: 2022-09-15 — End: 2023-04-26

## 2022-09-15 MED ORDER — BETAMETHASONE SOD PHOS & ACET 6 (3-3) MG/ML IJ SUSP
6.0000 mg | Freq: Once | INTRAMUSCULAR | Status: DC
Start: 2022-09-15 — End: 2023-04-26

## 2022-09-15 MED ORDER — LIDOCAINE HCL 1 % IJ SOLN
5.0000 mL | Freq: Once | INTRAMUSCULAR | Status: DC
Start: 2022-09-15 — End: 2023-04-26

## 2022-09-15 NOTE — Progress Notes (Signed)
  PROCEDURE RECORD [facility]   Name: CANESHIA GOUGHNOUR DOB:04-28-1941 MRN: [id number]  Date:09/15/2022  Physician: Claudette Laws, MD    Nurse/CMA: Somtochukwu Woollard S  Allergies:  Allergies  Allergen Reactions   Ramipril Anaphylaxis and Other (See Comments)   Cortisone Itching and Rash   Niacin Diarrhea    And nausea   Niacin And Related Diarrhea   Valsartan Other (See Comments)    Hyperkalemia     Consent Signed: Yes.    Is patient diabetic? Yes.    CBG today? 120  Pregnant: No. LMP: No LMP recorded. Patient is postmenopausal. (age 82-55)  Anticoagulants: yes (Eliquis) Anti-inflammatory: no Antibiotics: no  Procedure: Bilateral Sacroiliac injection  Position: Prone Start Time: 11:57  End Time: 12:04  Fluoro Time: 29  RN/CMA Catalina Gravel S    Time 11:24 12:11    BP 121/72 117/71    Pulse 77 114    Respirations 16 16    O2 Sat 89 90    S/S 6 6    Pain Level 6/10 6/10     D/C home with Brayton Mars, patient A & O X 3, D/C instructions reviewed, and sits independently.

## 2022-09-15 NOTE — Progress Notes (Signed)
Bilateral sacroiliac injections under fluoroscopic guidance  Indication: Low back and buttocks pain not relieved by medication management and other conservative care.  Informed consent was obtained after describing risks and benefits of the procedure with the patient, this includes bleeding, bruising, infection, paralysis and medication side effects. The patient wishes to proceed and has given written consent. The patient was placed in a prone position. The lumbar and sacral area was marked and prepped with Betadine. A 25-gauge 1-1/2 inch needle was inserted into the skin and subcutaneous tissue and 1 mL of 1% lidocaine was injected into each side. Then a 25-gauge 3 inch spinal needle was inserted under fluoroscopic guidance into the left sacroiliac joint. AP and lateral images were utilized. Omnipaque 180x0.5 mL under live fluoroscopy demonstrated no intravascular uptake. Then a solution containing one ML of 6 mg per mL Celestone in 2 ML of 2% lidocaine MPF was injected x1.5 mL. This same procedure was repeated on the right side using the same needle, injectate, and technique. Patient tolerated the procedure well. Post procedure instructions were given. Please see post procedure form.  Meds: Celestone 6mg Omnipaque 1ml Lidocaine 1% 2ml Lidocaine 2% PF 2 ml  

## 2022-09-15 NOTE — Patient Instructions (Signed)
Sacroiliac injection was performed today. A combination of numbing medicine (lidocaine) plus a cortisone medicine (betamethasone) was injected. The injection was done under x-ray guidance. This procedure has been performed to help reduce low back and buttocks pain as well as potentially hip pain. The duration of this injection is variable lasting from hours to  Months. It may repeated if needed. 

## 2022-09-18 DIAGNOSIS — Z794 Long term (current) use of insulin: Secondary | ICD-10-CM | POA: Diagnosis not present

## 2022-09-18 DIAGNOSIS — E1165 Type 2 diabetes mellitus with hyperglycemia: Secondary | ICD-10-CM | POA: Diagnosis not present

## 2022-09-19 ENCOUNTER — Other Ambulatory Visit: Payer: Self-pay | Admitting: *Deleted

## 2022-09-19 ENCOUNTER — Ambulatory Visit (INDEPENDENT_AMBULATORY_CARE_PROVIDER_SITE_OTHER): Payer: 59 | Admitting: Podiatry

## 2022-09-19 DIAGNOSIS — E1142 Type 2 diabetes mellitus with diabetic polyneuropathy: Secondary | ICD-10-CM

## 2022-09-19 DIAGNOSIS — M2042 Other hammer toe(s) (acquired), left foot: Secondary | ICD-10-CM

## 2022-09-19 DIAGNOSIS — E11610 Type 2 diabetes mellitus with diabetic neuropathic arthropathy: Secondary | ICD-10-CM

## 2022-09-19 DIAGNOSIS — M2041 Other hammer toe(s) (acquired), right foot: Secondary | ICD-10-CM

## 2022-09-19 MED ORDER — HYDROCODONE-ACETAMINOPHEN 5-325 MG PO TABS: 1.0000 | ORAL_TABLET | Freq: Four times a day (QID) | ORAL | 0 refills | Status: DC | PRN

## 2022-09-19 NOTE — Progress Notes (Signed)
Patient presents today to pick up diabetic shoes and insoles.  She tried on the shoes with the insoles and the fit was not satisfactory.   She states the shoes were supposed to be white and not black.    Reorder: 813 in white  Insoles were kept by [place]  to check fit with reordered shoes.  Patient will be contacted for a fitting appointment once the reordered shoes arrive in office.

## 2022-09-19 NOTE — Telephone Encounter (Signed)
Patient called for refill.  Due 09/23/22

## 2022-09-21 NOTE — Telephone Encounter (Signed)
Patient called she really wants to talk to Dr Allena Katz about the inserts she does not think she was given the correct ones for her Charcot.

## 2022-09-27 ENCOUNTER — Encounter (INDEPENDENT_AMBULATORY_CARE_PROVIDER_SITE_OTHER): Payer: 59 | Admitting: Ophthalmology

## 2022-09-27 DIAGNOSIS — H18221 Idiopathic corneal edema, right eye: Secondary | ICD-10-CM

## 2022-09-27 DIAGNOSIS — E113312 Type 2 diabetes mellitus with moderate nonproliferative diabetic retinopathy with macular edema, left eye: Secondary | ICD-10-CM

## 2022-09-27 DIAGNOSIS — H43813 Vitreous degeneration, bilateral: Secondary | ICD-10-CM

## 2022-09-27 DIAGNOSIS — I10 Essential (primary) hypertension: Secondary | ICD-10-CM | POA: Diagnosis not present

## 2022-09-27 DIAGNOSIS — H353122 Nonexudative age-related macular degeneration, left eye, intermediate dry stage: Secondary | ICD-10-CM | POA: Diagnosis not present

## 2022-09-27 DIAGNOSIS — H35033 Hypertensive retinopathy, bilateral: Secondary | ICD-10-CM

## 2022-09-27 DIAGNOSIS — E113511 Type 2 diabetes mellitus with proliferative diabetic retinopathy with macular edema, right eye: Secondary | ICD-10-CM | POA: Diagnosis not present

## 2022-10-04 DIAGNOSIS — L0292 Furuncle, unspecified: Secondary | ICD-10-CM | POA: Diagnosis not present

## 2022-10-04 DIAGNOSIS — N1832 Chronic kidney disease, stage 3b: Secondary | ICD-10-CM | POA: Diagnosis not present

## 2022-10-09 ENCOUNTER — Encounter (HOSPITAL_BASED_OUTPATIENT_CLINIC_OR_DEPARTMENT_OTHER): Payer: 59 | Admitting: Physical Medicine and Rehabilitation

## 2022-10-09 ENCOUNTER — Encounter: Payer: Self-pay | Admitting: Physical Medicine and Rehabilitation

## 2022-10-09 VITALS — BP 133/78 | HR 70 | Ht 62.0 in | Wt 139.6 lb

## 2022-10-09 DIAGNOSIS — M4800 Spinal stenosis, site unspecified: Secondary | ICD-10-CM | POA: Diagnosis not present

## 2022-10-09 DIAGNOSIS — G8929 Other chronic pain: Secondary | ICD-10-CM | POA: Diagnosis not present

## 2022-10-09 DIAGNOSIS — M48062 Spinal stenosis, lumbar region with neurogenic claudication: Secondary | ICD-10-CM | POA: Diagnosis not present

## 2022-10-09 DIAGNOSIS — M533 Sacrococcygeal disorders, not elsewhere classified: Secondary | ICD-10-CM | POA: Diagnosis not present

## 2022-10-09 MED ORDER — HYDROCODONE-ACETAMINOPHEN 5-325 MG PO TABS: 1.0000 | ORAL_TABLET | Freq: Four times a day (QID) | ORAL | 0 refills | Status: DC | PRN

## 2022-10-09 NOTE — Patient Instructions (Signed)
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  Hx of Charcot feet B/L Here for f/u for chronic pain.     Add her 100 mg to 300 mg Gabapentin- so take 400 mg nightly-and if it works, then call me back when gets refill for Norco and I will send in 400 mg Gabapentin pill for her.   2.  Still taking Duloxetine- gets from Dr Cliffton Asters- PCP- 60 mg daily.    3. Con't Norco 5/325 mg 3x/day and another 10 pills/month for bad days. Can fill 10/18/22   4. Will send in 2nd month of Norco- fill not earlier than 11/17/22  5. Get a tennis ball- or a firm ball- hold pressure or lay the tennis ball on thigh and hold pressure 2-4 minutes to relax the muscle- mos tof my patients do 3-4x/week, but can be daily if need be.   6. Can continue Voltaren/diclofenac gel on knees/shins  up to 4x/day  7. F/U in 3 months- can see Riley Lam in 3 months and me in 6 months.

## 2022-10-09 NOTE — Progress Notes (Signed)
Subjective:    Patient ID: Sabrina Baker, female    DOB: 1940-07-24, 82 y.o.   MRN: [id number]  HPI Pt is an 82 yr old with hx of Afib, HTN , DM with diabetic neuropathy, , CKD3; HLD, chronic dCHF, GERD, gout,  anemia- chronic; and 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  Hx of Charcot feet B/L Here for f/u for chronic pain.    Been having more pain in neck and R shoulder where has "plates and screws" in R shoulder.   Has more pain also in "bones" of shins and gout acting up some in B/L nankles and L thumb.   Right now- has pain in R lateral thigh pain with a "knot" in leg.    Using Voltaren gel  as well- which is helpful.   Just got  B/L SI joint injections Didn't last as well as first injection.  Starting to wear off already.   Taking pain meds-  2-5 pills/day.  But doesn't wear off early.   Since had COVID, pain is worse.  Was ventilated- for >30 days- in 2020.   Is vegetarian- for 63 years.  Eats cheese/eggs and dairy once in awhile.   Just still taking Gabapentin 300 mg QHS-  Goes to bed 10pm to 2am.            Pain Inventory Average Pain 8 Pain Right Now 4 My pain is intermittent and aching  In the last 24 hours, has pain interfered with the following? General activity 5 Relation with others 5 Enjoyment of life 5 What TIME of day is your pain at its worst? varies mostly daytime Sleep (in general) Fair  Pain is worse with: some activites Pain improves with: rest and medication Relief from Meds: 4  No family history on file. Social History   Socioeconomic History   Marital status: Divorced    Spouse name: Not on file   Number of children: Not on file   Years of education: Not on file   Highest education level: Not on file  Occupational History   Not on file  Tobacco Use   Smoking status: Never   Smokeless tobacco: Never  Vaping Use   Vaping Use: Never used  Substance and Sexual Activity   Alcohol  use: No   Drug use: No   Sexual activity: Never    Birth control/protection: Post-menopausal  Other Topics Concern   Not on file  Social History Narrative   Not on file   Social Determinants of Health   Financial Resource Strain: Not on file  Food Insecurity: Not on file  Transportation Needs: Not on file  Physical Activity: Not on file  Stress: Not on file  Social Connections: Not on file   Past Surgical History:  Procedure Laterality Date   25 GAUGE PARS PLANA VITRECTOMY WITH 20 GAUGE MVR PORT Right 03/16/2015   Procedure: 25 GAUGE PARS PLANA VITRECTOMY WITH 23 GAUGE MVR PORT;  Surgeon: Sherrie George, MD;  Location: [facility] OR;  Service: Ophthalmology;  Laterality: Right;   ANKLE SURGERY     BREAST BIOPSY Right 06/28/2022   [place] RT BREAST BX W LOC DEV 1ST LESION IMG BX SPEC US GUIDE 06/28/2022 [facility]   BREAST BIOPSY  08/02/2022   MM RT RADIOACTIVE SEED LOC MAMMO GUIDE 08/02/2022 [facility]   BREAST EXCISIONAL BIOPSY     BREAST LUMPECTOMY     BREAST LUMPECTOMY WITH RADIOACTIVE SEED LOCALIZATION Right  01/24/2016   Procedure: RIGHT BREAST LUMPECTOMY WITH RADIOACTIVE SEED LOCALIZATION;  Surgeon: Abigail Miyamoto, MD;  Location: [facility];  Service: General;  Laterality: Right;   BREAST LUMPECTOMY WITH RADIOACTIVE SEED LOCALIZATION Right 08/03/2022   Procedure: RIGHT BREAST LUMPECTOMY WITH RADIOACTIVE SEED LOCALIZATION;  Surgeon: Abigail Miyamoto, MD;  Location: [facility];  Service: General;  Laterality: Right;  LMA   EYE SURGERY     detached retna X2   HEEL SPUR SURGERY  2012   INCISION AND DRAINAGE ABSCESS Right 11/21/2019   Procedure: INCISION AND DRAINAGE FOREHEAD & RIGHT EAR ABSCESS;  Surgeon: Osborn Coho, MD;  Location: [facility] OR;  Service: ENT;  Laterality: Right;   INJECTION OF SILICONE OIL Right 03/16/2015   Procedure: Extraction OF SILICONE OIL;  Surgeon: Sherrie George, MD;  Location: [facility] OR;  Service: Ophthalmology;   Laterality: Right;   ORIF ANKLE FRACTURE Left 06/17/2014   Procedure: OPEN REDUCTION INTERNAL FIXATION (ORIF) LEFT BIMALLEOLAR ANKLE FRACTURE;  Surgeon: Cheral Almas, MD;  Location: [facility] OR;  Service: Orthopedics;  Laterality: Left;   PHOTOCOAGULATION WITH LASER Right 03/16/2015   Procedure: PHOTOCOAGULATION WITH LASER;  Surgeon: Sherrie George, MD;  Location: [facility] OR;  Service: Ophthalmology;  Laterality: Right;   SHOULDER ARTHROSCOPY Right    plates and screws   VITRECTOMY Right 03/16/2015   WRIST ARTHROPLASTY Left    plates and screws and bone graft   Past Surgical History:  Procedure Laterality Date   25 GAUGE PARS PLANA VITRECTOMY WITH 20 GAUGE MVR PORT Right 03/16/2015   Procedure: 25 GAUGE PARS PLANA VITRECTOMY WITH 23 GAUGE MVR PORT;  Surgeon: Sherrie George, MD;  Location[facility] OR;  Service: Ophthalmology;  Laterality: Right;   ANKLE SURGERY     BREAST BIOPSY Right 06/28/2022   [place] RT BREAST BX W LOC DEV 1ST LESION IMG BX SPEC US GUIDE 06/28/2022 [facility]   BREAST BIOPSY  08/02/2022   MM RT RADIOACTIVE SEED LOC MAMMO GUIDE 08/02/2022 [facility]   BREAST EXCISIONAL BIOPSY     BREAST LUMPECTOMY     BREAST LUMPECTOMY WITH RADIOACTIVE SEED LOCALIZATION Right 01/24/2016   Procedure: RIGHT BREAST LUMPECTOMY WITH RADIOACTIVE SEED LOCALIZATION;  Surgeon: Abigail Miyamoto, MD;  Location: [facility];  Service: General;  Laterality: Right;   BREAST LUMPECTOMY WITH RADIOACTIVE SEED LOCALIZATION Right 08/03/2022   Procedure: RIGHT BREAST LUMPECTOMY WITH RADIOACTIVE SEED LOCALIZATION;  Surgeon: Abigail Miyamoto, MD;  Location:  SURGERY CENTER;  Service: General;  Laterality: Right;  LMA   EYE SURGERY     detached retna X2   HEEL SPUR SURGERY  2012   INCISION AND DRAINAGE ABSCESS Right 11/21/2019   Procedure: INCISION AND DRAINAGE FOREHEAD & RIGHT EAR ABSCESS;  Surgeon: Osborn Coho, MD;  Location: [facility] OR;  Service: ENT;  Laterality: Right;    INJECTION OF SILICONE OIL Right 03/16/2015   Procedure: Extraction OF SILICONE OIL;  Surgeon: Sherrie George, MD;  Location: [facility] OR;  Service: Ophthalmology;  Laterality: Right;   ORIF ANKLE FRACTURE Left 06/17/2014   Procedure: OPEN REDUCTION INTERNAL FIXATION (ORIF) LEFT BIMALLEOLAR ANKLE FRACTURE;  Surgeon: Cheral Almas, MD;  Location: [facility] OR;  Service: Orthopedics;  Laterality: Left;   PHOTOCOAGULATION WITH LASER Right 03/16/2015   Procedure: PHOTOCOAGULATION WITH LASER;  Surgeon: Sherrie George, MD;  Location: [facility] OR;  Service: Ophthalmology;  Laterality: Right;   SHOULDER ARTHROSCOPY Right    plates and screws   VITRECTOMY Right 03/16/2015  WRIST ARTHROPLASTY Left    plates and screws and bone graft   Past Medical History:  Diagnosis Date   Acute diastolic congestive heart failure (HCC)    Carotid stenosis    Carotid US 1/22: Bilateral ICA 1-39; follow-up as needed   Chronic kidney disease    stage 3   Diabetes mellitus without complication (HCC)    Gout    Hypercholesteremia    Hypertension    Neuromuscular disorder (HCC)    neuropathy   Osteopenia    Retinal detachment    TIA (transient ischemic attack)    Vitamin B12 deficiency    BP (!) 146/98   Pulse 70   Ht 5\' 2"  (1.575 m)   Wt 139 lb 9.6 oz (63.3 kg)   SpO2 98%   BMI 25.53 kg/m   BP recheck 133/78  Opioid Risk Score:   Fall Risk Score:  `1  Depression screen [facility] 2/9     09/15/2022   11:43 AM 06/06/2022   12:30 PM 04/05/2022   11:06 AM 12/01/2019    1:51 PM  Depression screen PHQ 2/9  Decreased Interest 1 1 1  0  Down, Depressed, Hopeless 1 1 1  0  PHQ - 2 Score 2 2 2  0  Altered sleeping   1   Tired, decreased energy   1   Change in appetite   0   Feeling bad or failure about yourself    0   Trouble concentrating   0   Moving slowly or fidgety/restless   0   Suicidal thoughts   0   PHQ-9 Score   4   Difficult doing work/chores   Somewhat difficult     Review of Systems  Constitutional:  Negative.   HENT: Negative.    Eyes: Negative.   Respiratory: Negative.    Cardiovascular: Negative.   Gastrointestinal: Negative.   Endocrine: Negative.   Genitourinary: Negative.   Musculoskeletal:  Positive for back pain and gait problem.  Allergic/Immunologic: Negative.   Hematological:  Bruises/bleeds easily.       Eliquis  Psychiatric/Behavioral: Negative.    All other systems reviewed and are negative.      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       Assessment & Plan:   Pt is an 82 yr old with hx of Afib, HTN , DM with diabetic neuropathy, , CKD3; HLD, chronic dCHF, GERD, gout,  anemia- chronic; and 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  Hx of Charcot feet B/L Here for f/u for chronic pain.     Add her 100 mg to 300 mg Gabapentin- so take 400 mg nightly-and if it works, then call me back when gets refill for Norco and I will send in 400 mg Gabapentin pill for her.   2.  Still taking Duloxetine- gets from Dr Cliffton Asters- PCP- 60 mg daily.    3. Con't Norco 5/325 mg 3x/day and another 10 pills/month for bad days. Can fill 10/18/22   4. Will send in 2nd month of Norco- fill not earlier than 11/17/22  5. Get a tennis ball- or a firm ball- hold pressure or lay the tennis ball on thigh and hold pressure 2-4 minutes to relax the muscle- mos tof my patients do 3-4x/week, but can be  daily if need be.   6. Can continue Voltaren/diclofenac gel on knees/shins  up to 4x/day  7. F/U in 3 months- can see Riley Lam in 3 months and me in 6 months.   8. UDS was done last visit- will con't as required.    I spent a total of  32  minutes on total care today- >50% coordination of care- due to education on SI joint, pain meds; Riley Lam and me alternating; and going up on Nerve pain

## 2022-10-10 DIAGNOSIS — E1122 Type 2 diabetes mellitus with diabetic chronic kidney disease: Secondary | ICD-10-CM | POA: Diagnosis not present

## 2022-10-10 DIAGNOSIS — N2581 Secondary hyperparathyroidism of renal origin: Secondary | ICD-10-CM | POA: Diagnosis not present

## 2022-10-10 DIAGNOSIS — D631 Anemia in chronic kidney disease: Secondary | ICD-10-CM | POA: Diagnosis not present

## 2022-10-10 DIAGNOSIS — N1832 Chronic kidney disease, stage 3b: Secondary | ICD-10-CM | POA: Diagnosis not present

## 2022-10-10 DIAGNOSIS — I10 Essential (primary) hypertension: Secondary | ICD-10-CM | POA: Diagnosis not present

## 2022-10-12 ENCOUNTER — Ambulatory Visit: Payer: 59 | Admitting: Podiatry

## 2022-10-12 DIAGNOSIS — E11610 Type 2 diabetes mellitus with diabetic neuropathic arthropathy: Secondary | ICD-10-CM

## 2022-10-12 NOTE — Progress Notes (Signed)
Patient sent to [employer] by Dr Allena Katz to ensure the inserts are offloaded for her charcot

## 2022-10-25 ENCOUNTER — Ambulatory Visit (INDEPENDENT_AMBULATORY_CARE_PROVIDER_SITE_OTHER): Payer: 59 | Admitting: Podiatry

## 2022-10-25 DIAGNOSIS — B351 Tinea unguium: Secondary | ICD-10-CM

## 2022-10-25 DIAGNOSIS — E1142 Type 2 diabetes mellitus with diabetic polyneuropathy: Secondary | ICD-10-CM | POA: Diagnosis not present

## 2022-10-25 DIAGNOSIS — M79675 Pain in left toe(s): Secondary | ICD-10-CM

## 2022-10-25 DIAGNOSIS — M79674 Pain in right toe(s): Secondary | ICD-10-CM

## 2022-10-25 NOTE — Progress Notes (Signed)
  Subjective:  Patient ID: Sabrina Baker, female    DOB: February 16, 1941,  MRN: [id number]  Chief Complaint  Patient presents with   Diabetes   82 y.o. female returns for the above complaint.  Patient presents with thickened elongated dystrophic toenails x10.  Pain on palpation.  Patient would like for me to debride down which is not able to do it herself.  She denies any other acute complaints.  Objective:  There were no vitals filed for this visit. Podiatric Exam: Vascular: dorsalis pedis and posterior tibial pulses are palpable bilateral. Capillary return is immediate. Temperature gradient is WNL. Skin turgor WNL  Sensorium: Normal Semmes Weinstein monofilament test. Normal tactile sensation bilaterally. Nail Exam: Pt has thick disfigured discolored nails with subungual debris noted bilateral entire nail hallux through fifth toenails.  Pain on palpation to the nails. Ulcer Exam: There is no evidence of ulcer or pre-ulcerative changes or infection. Orthopedic Exam: Muscle tone and strength are WNL. No limitations in general ROM. No crepitus or effusions noted. HAV  B/L.  Hammer toes 2-5  B/L. Skin: No Porokeratosis. No infection or ulcers    Assessment & Plan:   No diagnosis found.    Patient was evaluated and treated and all questions answered.  Hammertoes 2 through 5 bilateral -I explained to the patient the etiology of hammertoe contractures and various treatment options were discussed.  Given the amount of pain she is having I believe patient will benefit from diabetic shoes to prevent ulceration given the contractures.   -Diabetic shoes are functioning well.   Onychomycosis with pain  -Nails palliatively debrided as below. -Educated on self-care  Procedure: Nail Debridement Rationale: pain  Type of Debridement: manual, sharp debridement. Instrumentation: Nail nipper, rotary burr. Number of Nails: 10  Procedures and Treatment: Consent by patient was obtained for treatment  procedures. The patient understood the discussion of treatment and procedures well. All questions were answered thoroughly reviewed. Debridement of mycotic and hypertrophic toenails, 1 through 5 bilateral and clearing of subungual debris. No ulceration, no infection noted.  Return Visit-Office Procedure: Patient instructed to return to the office for a follow up visit 3 months for continued evaluation and treatment.  Nicholes Rough, DPM    No follow-ups on file.

## 2022-10-27 ENCOUNTER — Encounter: Payer: Self-pay | Admitting: Adult Health

## 2022-10-27 ENCOUNTER — Other Ambulatory Visit: Payer: Self-pay

## 2022-10-27 ENCOUNTER — Inpatient Hospital Stay: Payer: 59 | Attending: Hematology and Oncology | Admitting: Adult Health

## 2022-10-27 VITALS — BP 107/51 | HR 63 | Temp 98.1°F | Resp 16 | Wt 142.3 lb

## 2022-10-27 DIAGNOSIS — Z170 Estrogen receptor positive status [ER+]: Secondary | ICD-10-CM | POA: Diagnosis not present

## 2022-10-27 DIAGNOSIS — M85839 Other specified disorders of bone density and structure, unspecified forearm: Secondary | ICD-10-CM | POA: Diagnosis not present

## 2022-10-27 DIAGNOSIS — Z79811 Long term (current) use of aromatase inhibitors: Secondary | ICD-10-CM | POA: Diagnosis not present

## 2022-10-27 DIAGNOSIS — C50411 Malignant neoplasm of upper-outer quadrant of right female breast: Secondary | ICD-10-CM | POA: Insufficient documentation

## 2022-10-27 DIAGNOSIS — M8589 Other specified disorders of bone density and structure, multiple sites: Secondary | ICD-10-CM | POA: Diagnosis not present

## 2022-10-27 DIAGNOSIS — N189 Chronic kidney disease, unspecified: Secondary | ICD-10-CM | POA: Insufficient documentation

## 2022-10-27 DIAGNOSIS — Z79899 Other long term (current) drug therapy: Secondary | ICD-10-CM | POA: Diagnosis not present

## 2022-10-27 NOTE — Progress Notes (Signed)
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)  07/13/2022 Initial Diagnosis   Screening mammogram detected possible mass Rt Breast Right 1.4 cm, Axilla Neg, breast Biopsy: Grade 1 IDC with LG DCIS, ER 90%, PR: 40%, Ki 67: 25%, Her 2: 1+ (Neg)   07/13/2022 Cancer Staging   Staging form: Breast, AJCC 8th Edition - Clinical stage from 07/13/2022: Stage IA (cT1c, cN0, cM0, G1, ER+, PR+, HER2-) - Signed by Ronny Bacon, PA-C on 07/13/2022 Stage prefix: Initial diagnosis Method of lymph node assessment: Clinical Histologic grading system: 3 grade system   08/03/2022 Surgery   Right lumpectomy: Grade 1 IDC 1.4 cm with focal DCIS, margins negative, PASH, ER 90%, PR 40%, HER2 1+ negative, Ki-67 25%   08/2022 -  Anti-estrogen oral therapy   Anastrozole x5 years     INTERVAL HISTORY:  Sabrina Baker to review her survivorship care plan detailing her treatment course for breast cancer, as well as monitoring long-term side effects of that treatment, education regarding health maintenance, screening, and overall wellness and health promotion.     Overall, Sabrina Baker reports feeling quite well.  She is taking Anastrozole daily with good tolerance.  She has chronic sciatica.    REVIEW OF SYSTEMS:  Review of Systems  Constitutional:  Negative for appetite change, chills, fatigue, fever and unexpected weight change.  HENT:   Negative for hearing loss, lump/mass and trouble swallowing.   Eyes:  Negative for eye problems and icterus.  Respiratory:  Negative for chest tightness, cough and shortness of breath.   Cardiovascular:  Negative for chest pain, leg swelling and palpitations.  Gastrointestinal:  Negative for abdominal distention, abdominal pain, constipation, diarrhea, nausea and vomiting.  Endocrine: Negative for hot flashes.  Genitourinary:  Negative for difficulty urinating.   Musculoskeletal:  Negative  for arthralgias.  Skin:  Negative for itching and rash.  Neurological:  Negative for dizziness, extremity weakness, headaches and numbness.  Hematological:  Negative for adenopathy. Does not bruise/bleed easily.  Psychiatric/Behavioral:  Negative for depression. The patient is not nervous/anxious.    Breast: Denies any new nodularity, masses, tenderness, nipple changes, or nipple discharge.       PAST MEDICAL/SURGICAL HISTORY:  Past Medical History:  Diagnosis Date   Acute diastolic congestive heart failure (HCC)    Carotid stenosis    Carotid US 1/22: Bilateral ICA 1-39; follow-up as needed   Chronic kidney disease    stage 3   Diabetes mellitus without complication (HCC)    Gout    Hypercholesteremia    Hypertension    Neuromuscular disorder (HCC)    neuropathy   Osteopenia    Retinal detachment    TIA (transient ischemic attack)    Vitamin B12 deficiency    Past Surgical History:  Procedure Laterality Date   25 GAUGE PARS PLANA VITRECTOMY WITH 20 GAUGE MVR PORT Right 03/16/2015   Procedure: 25 GAUGE PARS PLANA VITRECTOMY WITH 23 GAUGE MVR PORT;  Surgeon: Sherrie George, MD;  Location: [facility] OR;  Service: Ophthalmology;  Laterality: Right;   ANKLE SURGERY     BREAST BIOPSY Right 06/28/2022   [place] RT BREAST BX W LOC DEV 1ST LESION IMG BX SPEC US GUIDE 06/28/2022 [facility] MAMMOGRAPHY   BREAST BIOPSY  08/02/2022   MM RT RADIOACTIVE SEED LOC MAMMO GUIDE 08/02/2022 [facility] MAMMOGRAP[facility]   BREAST EXCISIONAL BIOPSY     BREAST LUMPECTOMY     BREAST LUMPECTOMY WITH  RADIOACTIVE SEED LOCALIZATION Right 01/24/2016   Procedure: RIGHT BREAST LUMPECTOMY WITH RADIOACTIVE SEED LOCALIZATION;  Surgeon: Abigail Miyamoto, MD;  Location: [facility];  Service: General;  Laterality: Right;   BREAST LUMPECTOMY WITH RADIOACTIVE SEED LOCALIZATION Right 08/03/2022   Procedure: RIGHT BREAST LUMPECTOMY WITH RADIOACTIVE SEED LOCALIZATION;  Surgeon: Abigail Miyamoto, MD;  Location: [facility];  Service: General;  Laterality: Right;  LMA   EYE SURGERY     detached retna X2   HEEL SPUR SURGERY  2012   INCISION AND DRAINAGE ABSCESS Right 11/21/2019   Procedure: INCISION AND DRAINAGE FOREHEAD & RIGHT EAR ABSCESS;  Surgeon: Osborn Coho, MD;  Location: [facility] OR;  Service: ENT;  Laterality: Right;   INJECTION OF SILICONE OIL Right 03/16/2015   Procedure: Extraction OF SILICONE OIL;  Surgeon: Sherrie George, MD;  Location: [facility] OR;  Service: Ophthalmology;  Laterality: Right;   ORIF ANKLE FRACTURE Left 06/17/2014   Procedure: OPEN REDUCTION INTERNAL FIXATION (ORIF) LEFT BIMALLEOLAR ANKLE FRACTURE;  Surgeon: Cheral Almas, MD;  Location: [facility] OR;  Service: Orthopedics;  Laterality: Left;   PHOTOCOAGULATION WITH LASER Right 03/16/2015   Procedure: PHOTOCOAGULATION WITH LASER;  Surgeon: Sherrie George, MD;  Location: [facility] OR;  Service: Ophthalmology;  Laterality: Right;   SHOULDER ARTHROSCOPY Right    plates and screws   VITRECTOMY Right 03/16/2015   WRIST ARTHROPLASTY Left    plates and screws and bone graft     ALLERGIES:  Allergies  Allergen Reactions   Ramipril Anaphylaxis and Other (See Comments)   Cortisone Itching and Rash   Niacin Diarrhea    And nausea   Niacin And Related Diarrhea   Valsartan Other (See Comments)    Hyperkalemia      CURRENT MEDICATIONS:  Outpatient Encounter Medications as of 10/27/2022  Medication Sig   acetaminophen (TYLENOL) 650 MG CR tablet Take 1,300 mg by mouth every 8 (eight) hours as needed for pain.   anastrozole (ARIMIDEX) 1 MG tablet Take 1 tablet (1 mg total) by mouth daily.   apixaban (ELIQUIS) 2.5 MG TABS tablet Take 1 tablet (2.5 mg total) by mouth 2 (two) times daily.   atorvastatin (LIPITOR) 10 MG tablet Take 10 mg by mouth daily.   bevacizumab (AVASTIN) 400 MG/16ML SOLN Inject into the vein.   Blood Glucose Monitoring Suppl (GLUCOCOM BLOOD GLUCOSE MONITOR) DEVI Use to test blood sugar 3 times a day (E11.65)    brimonidine (ALPHAGAN) 0.2 % ophthalmic solution Place 1 drop into the left eye 2 (two) times daily.   calcitRIOL (ROCALTROL) 0.25 MCG capsule Take 0.25 mcg by mouth 3 (three) times a week. Mon, Wed, Friday   calcium-vitamin D (OSCAL WITH D) 500-200 MG-UNIT TABS tablet Take 1 tablet by mouth daily.    carvedilol (COREG) 25 MG tablet Take 25 mg by mouth 2 (two) times daily with a meal.   cyanocobalamin 100 MCG tablet Take 100 mcg by mouth daily.   diclofenac Sodium (VOLTAREN) 1 % GEL Apply topically 4 (four) times daily. As needed   DULoxetine (CYMBALTA) 60 MG capsule Take 60 mg by mouth daily.   febuxostat (ULORIC) 40 MG tablet Take 40 mg by mouth daily. One tab a day   Ferrous Sulfate (IRON) 325 (65 FE) MG TABS Take 1 tablet by mouth 2 (two) times daily.   furosemide (LASIX) 40 MG tablet Take 1 tablet (40 mg total) by mouth daily.   gabapentin (NEURONTIN) 300 MG capsule Take 1 capsule (300 mg total)  by mouth 2 (two) times daily. Started with 300 mg nightly x 1 week, then increase to 300 mg BID or 60 0mg  QHS- for nerve pain (Patient taking differently: Take 300 mg by mouth 2 (two) times daily. Started with 300 mg nightly x 1 week, then increase to 300 mg BID or 60 0mg  QHS- for nerve pain. Take at bedtime)   HYDROcodone-acetaminophen (NORCO/VICODIN) 5-325 MG tablet Take 1 tablet by mouth every 6 (six) hours as needed for moderate pain. Chronic pain- fill after 11/17/22   insulin glargine, 2 Unit Dial, (TOUJEO MAX SOLOSTAR) 300 UNIT/ML Solostar Pen Inject 10 Units into the skin daily. (Patient taking differently: Inject 18 Units into the skin daily.)   Insulin Pen Needle 32G X 4 MM MISC USE TO INJECT INSULIN ONCE A DAY   ketoconazole (NIZORAL) 2 % shampoo Apply 1 application topically 2 (two) times a week.    Lancets 30G MISC Use to test blood sugars 3 times a day (Dx: E11.65)   nateglinide (STARLIX) 120 MG tablet Take 60 mg by mouth 3 (three) times daily as needed. If CBG is above 110   ONETOUCH VERIO  test strip USE AS DIRECTED. TESTING FREQUENCY  3 X/DAILY (DX E11.65)   pantoprazole (PROTONIX) 40 MG tablet 1 tablet Orally twice a day Dx: K21.9 for 90 days   triamcinolone cream (KENALOG) 0.1 % Apply 1 application topically 2 (two) times daily as needed (For rash).    trimethoprim-polymyxin b (POLYTRIM) ophthalmic solution Use 4 times in left eye the day of injection, 4 drop the following day.   TRULICITY 1.5 MG/0.5ML SOPN Inject 3 mg into the skin every 7 (seven) days. Friday   Facility-Administered Encounter Medications as of 10/27/2022  Medication   betamethasone acetate-betamethasone sodium phosphate (CELESTONE) injection 6 mg   iohexol (OMNIPAQUE) 180 MG/ML injection 3 mL   lidocaine (XYLOCAINE) 1 % (with pres) injection 5 mL     ONCOLOGIC FAMILY HISTORY:  No family history on file.   SOCIAL HISTORY:  Social History   Socioeconomic History   Marital status: Divorced    Spouse name: Not on file   Number of children: Not on file   Years of education: Not on file   Highest education level: Not on file  Occupational History   Not on file  Tobacco Use   Smoking status: Never   Smokeless tobacco: Never  Vaping Use   Vaping Use: Never used  Substance and Sexual Activity   Alcohol use: No   Drug use: No   Sexual activity: Never    Birth control/protection: Post-menopausal  Other Topics Concern   Not on file  Social History Narrative   Not on file   Social Determinants of Health   Financial Resource Strain: Not on file  Food Insecurity: Not on file  Transportation Needs: Not on file  Physical Activity: Not on file  Stress: Not on file  Social Connections: Not on file  Intimate Partner Violence: Not on file     OBSERVATIONS/OBJECTIVE:  BP (!) 107/51 (BP Location: Left Arm, Patient Position: Sitting)   Pulse 63   Temp 98.1 F (36.7 C) (Temporal)   Resp 16   Wt 142 lb 4.8 oz (64.5 kg)   SpO2 96%   BMI 26.03 kg/m  GENERAL: Patient is a well appearing female  in no acute distress HEENT:  Sclerae anicteric.  Oropharynx clear and moist. No ulcerations or evidence of oropharyngeal candidiasis. Neck is supple.  NODES:  No cervical, supraclavicular,  or axillary lymphadenopathy palpated.  BREAST EXAM:  Deferred. LUNGS:  Clear to auscultation bilaterally.  No wheezes or rhonchi. HEART:  Regular rate and rhythm. No murmur appreciated. ABDOMEN:  Soft, nontender.  Positive, normoactive bowel sounds. No organomegaly palpated. MSK:  No focal spinal tenderness to palpation. Full range of motion bilaterally in the upper extremities. EXTREMITIES:  No peripheral edema.   SKIN:  Clear with no obvious rashes or skin changes. No nail dyscrasia. NEURO:  Nonfocal. Well oriented.  Appropriate affect.   LABORATORY DATA:  None for this visit.  DIAGNOSTIC IMAGING:  None for this visit.      ASSESSMENT AND PLAN:  Ms.. Baker is a pleasant 82 y.o. female with Stage IA right breast invasive ductal carcinoma, ER+/PR+/HER2-, diagnosed in 07/2022, treated with lumpectomy, and anti-estrogen therapy with Anastrozole beginning in 08/2022.  She presents to the Survivorship Clinic for our initial meeting and routine follow-up post-completion of treatment for breast cancer.    1. Stage IA right breast cancer:  Sabrina Baker is continuing to recover from definitive treatment for breast cancer. She will follow-up with her medical oncologist, Dr.  Pamelia Hoit in 6 months with history and physical exam per surveillance protocol.  She will continue her anti-estrogen therapy with Anastrozole. Thus far, she is tolerating the Anastrozole well, with minimal side effects. Her mammogram is due 05/2023; orders placed today.     Today, a comprehensive survivorship care plan and treatment summary was reviewed with the patient today detailing her breast cancer diagnosis, treatment course, potential late/long-term effects of treatment, appropriate follow-up care with recommendations for the future, and  patient education resources.  A copy of this summary, along with a letter will be sent to the patient's primary care provider via mail/fax/In Basket message after today's visit.    2. Bone health:  Given Sabrina Baker's age/history of breast cancer and her current treatment regimen including anti-estrogen therapy with Anastrozole, she is at risk for bone demineralization.  Her last DEXA scan was 05/17/2020 and demonstrated osteopenia with a t score of -1.6 in the left femur and forearm. Orders placed for repeat testing to be completed 05/2023.  She was given education on specific activities to promote bone health.  3. Cancer screening:  Due to Sabrina Baker's history and her age, she should receive screening for skin cancers, colon cancer, and gynecologic cancers.  The information and recommendations are listed on the patient's comprehensive care plan/treatment summary and were reviewed in detail with the patient.    4. Health maintenance and wellness promotion: Sabrina Baker was encouraged to consume 5-7 servings of fruits and vegetables per day. We reviewed the "Nutrition Rainbow" handout.  She was also encouraged to engage in moderate to vigorous exercise for 30 minutes per day most days of the week.  She was instructed to limit her alcohol consumption and continue to abstain from tobacco use.     5. Support services/counseling: It is not uncommon for this period of the patient's cancer care trajectory to be one of many emotions and stressors.   She was given information regarding our available services and encouraged to contact me with any questions or for help enrolling in any of our support group/programs.    Follow up instructions:    -Return to cancer center in 6 months  -Mammogram due in 05/2023 -Bone density 05/2023 -She is welcome to return back to the Survivorship Clinic at any time; no additional follow-up needed at this time.  -Consider referral back to survivorship as  a long-term survivor for  continued surveillance  The patient was provided an opportunity to ask questions and all were answered. The patient agreed with the plan and demonstrated an understanding of the instructions.   Total encounter time:30 minutes*in face-to-face visit time, chart review, lab review, care coordination, order entry, and documentation of the encounter time.    Lillard Anes, NP 10/27/22 9:43 AM Medical Oncology and Hematology [facility] [address] [place], [place] [address] Tel. [phone]    Fax. [phone]  *Total Encounter Time as defined by the Centers for Medicare and Medicaid Services includes, in addition to the face-to-face time of a patient visit (documented in the note above) non-face-to-face time: obtaining and reviewing outside history, ordering and reviewing medications, tests or procedures, care coordination (communications with other health care professionals or caregivers) and documentation in the medical record.

## 2022-10-30 ENCOUNTER — Encounter (INDEPENDENT_AMBULATORY_CARE_PROVIDER_SITE_OTHER): Payer: 59 | Admitting: Ophthalmology

## 2022-11-10 ENCOUNTER — Encounter (INDEPENDENT_AMBULATORY_CARE_PROVIDER_SITE_OTHER): Payer: 59 | Admitting: Ophthalmology

## 2022-11-10 DIAGNOSIS — H43813 Vitreous degeneration, bilateral: Secondary | ICD-10-CM

## 2022-11-10 DIAGNOSIS — I10 Essential (primary) hypertension: Secondary | ICD-10-CM | POA: Diagnosis not present

## 2022-11-10 DIAGNOSIS — H35033 Hypertensive retinopathy, bilateral: Secondary | ICD-10-CM

## 2022-11-10 DIAGNOSIS — H338 Other retinal detachments: Secondary | ICD-10-CM

## 2022-11-10 DIAGNOSIS — E113312 Type 2 diabetes mellitus with moderate nonproliferative diabetic retinopathy with macular edema, left eye: Secondary | ICD-10-CM

## 2022-11-10 DIAGNOSIS — E113591 Type 2 diabetes mellitus with proliferative diabetic retinopathy without macular edema, right eye: Secondary | ICD-10-CM | POA: Diagnosis not present

## 2022-11-16 ENCOUNTER — Telehealth: Payer: Self-pay | Admitting: Physical Medicine and Rehabilitation

## 2022-11-16 MED ORDER — GABAPENTIN 400 MG PO CAPS
400.0000 mg | ORAL_CAPSULE | Freq: Three times a day (TID) | ORAL | 5 refills | Status: DC
Start: 2022-11-16 — End: 2023-04-11

## 2022-11-16 NOTE — Telephone Encounter (Signed)
Patient aware.

## 2022-11-16 NOTE — Telephone Encounter (Signed)
Patient called in and would like to know if there is 400 mg gabapentin dosage , patient said she has been taking the 300mg  that were prescribed and had 100 mg that were prescribed before and were left over so has been taking them at night and they have been helping her get better rest . Patient said she will be running out of her 100mg  and is requesting a refill . Patient uses [facility] in [place]

## 2022-11-28 ENCOUNTER — Other Ambulatory Visit: Payer: Self-pay | Admitting: Cardiovascular Disease

## 2022-11-28 NOTE — Telephone Encounter (Signed)
Prescription refill request for Eliquis received. Indication:afib Last office visit:11/23 Scr:2.14  3/24 Age: 82 Weight:64.5  kg  Prescription refilled

## 2022-12-01 DIAGNOSIS — E1142 Type 2 diabetes mellitus with diabetic polyneuropathy: Secondary | ICD-10-CM | POA: Diagnosis not present

## 2022-12-04 DIAGNOSIS — E1165 Type 2 diabetes mellitus with hyperglycemia: Secondary | ICD-10-CM | POA: Diagnosis not present

## 2022-12-04 DIAGNOSIS — Z794 Long term (current) use of insulin: Secondary | ICD-10-CM | POA: Diagnosis not present

## 2022-12-06 ENCOUNTER — Telehealth: Payer: Self-pay

## 2022-12-06 NOTE — Telephone Encounter (Signed)
Patient requesting a refill of Hydrocodone 5-325 MG  Filled  Written  ID  Drug  QTY  Days  Prescriber  RX #  Dispenser  Refill  Daily Dose*  Pymt Type  PMP  11/16/2022 11/16/2022 2  Gabapentin 400 Mg Capsule 90.00 30 Me Lov [id number] Nor ([id number]) 0/5  Medicare [place] 11/14/2022 07/26/2022 2  Hydrocodone-Acetamin 5-325 Mg 100.00 25 Me Lov [id number] Nor ([id number]) 0/0 20.00 MME Medicare [place] 10/16/2022 10/09/2022 2  Hydrocodone-Acetamin 5-325 Mg 100.00 25 Me Lov [id number] Nor ([id number]) 0/0 20.00 MME Medicare [place]

## 2022-12-07 MED ORDER — HYDROCODONE-ACETAMINOPHEN 5-325 MG PO TABS: 1.0000 | ORAL_TABLET | Freq: Four times a day (QID) | ORAL | 0 refills | Status: DC | PRN

## 2022-12-11 DIAGNOSIS — Z794 Long term (current) use of insulin: Secondary | ICD-10-CM | POA: Diagnosis not present

## 2022-12-11 DIAGNOSIS — E1165 Type 2 diabetes mellitus with hyperglycemia: Secondary | ICD-10-CM | POA: Diagnosis not present

## 2022-12-12 ENCOUNTER — Encounter (INDEPENDENT_AMBULATORY_CARE_PROVIDER_SITE_OTHER): Payer: 59 | Admitting: Ophthalmology

## 2022-12-12 DIAGNOSIS — H35033 Hypertensive retinopathy, bilateral: Secondary | ICD-10-CM | POA: Diagnosis not present

## 2022-12-12 DIAGNOSIS — H35371 Puckering of macula, right eye: Secondary | ICD-10-CM

## 2022-12-12 DIAGNOSIS — H43812 Vitreous degeneration, left eye: Secondary | ICD-10-CM

## 2022-12-12 DIAGNOSIS — I10 Essential (primary) hypertension: Secondary | ICD-10-CM | POA: Diagnosis not present

## 2022-12-12 DIAGNOSIS — E113591 Type 2 diabetes mellitus with proliferative diabetic retinopathy without macular edema, right eye: Secondary | ICD-10-CM

## 2022-12-12 DIAGNOSIS — H338 Other retinal detachments: Secondary | ICD-10-CM

## 2022-12-12 DIAGNOSIS — E113312 Type 2 diabetes mellitus with moderate nonproliferative diabetic retinopathy with macular edema, left eye: Secondary | ICD-10-CM

## 2022-12-26 NOTE — Progress Notes (Signed)
Office Visit Note  Patient: Sabrina Baker             Date of Birth: May 30, 1941           MRN: [id number]             PCP: Laurann Montana, MD Referring: Laurann Montana, MD Visit Date: 12/27/2022   Subjective:  Follow-up   History of Present Illness: Sabrina Baker is a 82 y.o. 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  Overall gout has been doing well no major flares.  Biggest problem has been the pain in her low back and sciatica.  Seeing [facility] for this with 2 back injections providing a partial benefit.  She also reports noticing some more trouble with her left thumb feels that the MCP joint is becoming more stiff and possibly wider than usual.  But not associated with any palpable swelling or redness or symptoms like her previous gout.  Previous HPI 07/05/22 Sabrina Baker is a 82 y.o. female here for follow up for erosive tophaceous gout on ulorix 40 mg daily daily.  She is off any maintenance colchicine without a particular flareup of inflammatory joint pain. She was switched from allopurinol to febuxostat related mostly to her renal function. 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  She is currently in the process of workup and plan for this problem.  She has had some increased pain in the low back pelvis and hip regions and went for a sacroiliac joint injection on December 26 this was beneficial.  She has some concern about progression of arthritis in her hand with some increase in joint nodule and decreased range of motion and additional MCP joints.  She also continues to have somewhat decreased use of pinch or pincer grip in her right hand due to the significant thumb damage.   Previous HPI 07/06/21 Sabrina Baker is a 82 y.o. female here for follow up for erosive tophaceous gout on allopurinol 300 mg daily and  colchicine 0.3 mg daily. She has increased her medication dose since the last visit without incident. Her right thumb IP joint remains enlarged with limited mobility but not causing much pain, no repeat episodes of redness and swelling. She is following up with Dr. Frazier Butt for her hand arthritis and deformity, currently observing unless it becomes unstable. She reports somewhat new or increased pain affecting both distal legs.   Previous HPI 01/21/21 Sabrina Baker is a 82 y.o. female here for erosive tophaceous gout.  She has longstanding but very infrequent history of gout limited to her lower extremity in the past.  In 2020 when hospitalized with COVID illness she experienced acute gouty arthritis of the right thumb with severe pain and swelling.  This had subsided with no persistent symptoms but she redeveloped right thumb pain and swelling worsening in May.  She does not recall any critical illness associated with triggering this new flare.  Outside of the right thumb she is not experiencing any other particularly swollen red or abnormally painful sites.  Hand was evaluated by orthopedics clinic with Dr. Roda Shutters findings felt to represent gouty arthritis x-ray showing severe erosive changes of the head of proximal phalanx and around the interphalangeal joint.  Lab findings show elevated white blood cell count of 15.4, uric acid of 11.2, mild and impairment of GFR on  July labs.  She was started on allopurinol at 100 mg daily dose.   Review of Systems  Constitutional:  Positive for fatigue.  HENT:  Positive for mouth dryness. Negative for mouth sores.   Eyes:  Negative for dryness.  Respiratory:  Negative for shortness of breath.   Cardiovascular:  Positive for chest pain. Negative for palpitations.  Gastrointestinal:  Positive for constipation and diarrhea. Negative for blood in stool.  Endocrine: Negative for increased urination.  Genitourinary:  Negative for involuntary urination.  Musculoskeletal:   Positive for joint pain, gait problem, joint pain, myalgias and myalgias. Negative for joint swelling, muscle weakness, morning stiffness and muscle tenderness.  Skin:  Negative for color change, rash, hair loss and sensitivity to sunlight.  Allergic/Immunologic: Negative for susceptible to infections.  Neurological:  Positive for headaches. Negative for dizziness.  Hematological:  Negative for swollen glands.  Psychiatric/Behavioral:  Negative for depressed mood and sleep disturbance. The patient is not nervous/anxious.     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) 07/13/2022   CKD (chronic kidney disease) 07/05/2022   Joint swelling 07/05/2022   Spinal stenosis, lumbar region, with neurogenic claudication 04/05/2022   Nerve pain due to spinal stenosis 04/05/2022   Abnormality of gait 04/05/2022   Acute diastolic congestive heart failure (HCC)    Pericardial effusion    Aortic valve stenosis    Hypoxia    Acute idiopathic gout    Facial cellulitis 03/04/2021   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   Staph aureus infection 11/20/2019   Cellulitis 11/19/2019   Atrial fibrillation (HCC) 12/23/2018   ARDS (adult respiratory distress syndrome) (HCC)    COVID-19    Mixed dyslipidemia 07/10/2016   Osteopenia 07/10/2016   Vitamin B12 deficiency 07/10/2016   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/19/2015   Gastroenteritis 04/17/2012   Dehydration 04/17/2012   Diabetes mellitus (HCC) 04/17/2012   HTN (hypertension) 04/17/2012   TIA (transient ischemic attack) 04/17/2012    Past Medical History:  Diagnosis Date   Acute diastolic congestive heart failure (HCC)    Breast cancer (HCC)    right breast   Carotid stenosis    Carotid US 1/22: Bilateral ICA 1-39; follow-up as needed   Chronic kidney  disease    stage 3   Diabetes mellitus without complication (HCC)    Gout    Hypercholesteremia    Hypertension    Neuromuscular disorder (HCC)    neuropathy   Osteopenia    Retinal detachment    Spinal stenosis    TIA (transient ischemic attack)    Vitamin B12 deficiency     History reviewed. No pertinent family history. Past Surgical History:  Procedure Laterality Date   25 GAUGE PARS PLANA VITRECTOMY WITH 20 GAUGE MVR PORT Right 03/16/2015   Procedure: 25 GAUGE PARS PLANA VITRECTOMY WITH 23 GAUGE MVR PORT;  Surgeon: Sherrie George, MD;  Location: [facility] OR;  Service: Ophthalmology;  Laterality: Right;   ANKLE SURGERY     BREAST BIOPSY Right 06/28/2022   [place] RT BREAST BX W LOC DEV 1ST LESION IMG BX SPEC US GUIDE 06/28/2022 [facility]   BREAST BIOPSY  08/02/2022   MM RT RADIOACTIVE SEED LOC MAMMO GUIDE 08/02/2022 [facility]   BREAST EXCISIONAL BIOPSY     BREAST LUMPECTOMY  BREAST LUMPECTOMY WITH RADIOACTIVE SEED LOCALIZATION Right 01/24/2016   Procedure: RIGHT BREAST LUMPECTOMY WITH RADIOACTIVE SEED LOCALIZATION;  Surgeon: Abigail Miyamoto, MD;  Location: [facility];  Service: General;  Laterality: Right;   BREAST LUMPECTOMY WITH RADIOACTIVE SEED LOCALIZATION Right 08/03/2022   Procedure: RIGHT BREAST LUMPECTOMY WITH RADIOACTIVE SEED LOCALIZATION;  Surgeon: Abigail Miyamoto, MD;  Location: [facility];  Service: General;  Laterality: Right;  LMA   EYE SURGERY     detached retna X2   HEEL SPUR SURGERY  2012   INCISION AND DRAINAGE ABSCESS Right 11/21/2019   Procedure: INCISION AND DRAINAGE FOREHEAD & RIGHT EAR ABSCESS;  Surgeon: Osborn Coho, MD;  Location: [facility] OR;  Service: ENT;  Laterality: Right;   INJECTION OF SILICONE OIL Right 03/16/2015   Procedure: Extraction OF SILICONE OIL;  Surgeon: Sherrie George, MD;  Location: [facility] OR;  Service: Ophthalmology;  Laterality: Right;   ORIF ANKLE FRACTURE Left 06/17/2014   Procedure: OPEN  REDUCTION INTERNAL FIXATION (ORIF) LEFT BIMALLEOLAR ANKLE FRACTURE;  Surgeon: Cheral Almas, MD;  Location: [facility] OR;  Service: Orthopedics;  Laterality: Left;   PHOTOCOAGULATION WITH LASER Right 03/16/2015   Procedure: PHOTOCOAGULATION WITH LASER;  Surgeon: Sherrie George, MD;  Location: [facility] OR;  Service: Ophthalmology;  Laterality: Right;   SHOULDER ARTHROSCOPY Right    plates and screws   VITRECTOMY Right 03/16/2015   WRIST ARTHROPLASTY Left    plates and screws and bone graft   Social History   Social History Narrative   Not on file   Immunization History  Administered Date(s) Administered   Influenza, High Dose Seasonal PF 03/11/2018   Influenza-Unspecified 03/12/2015   Moderna SARS-COV2 Booster Vaccination 05/11/2020, 11/23/2020   Zoster Recombinant(Shingrix) 06/21/2017, 06/21/2017     Objective: Vital Signs: BP 93/60 (BP Location: Left Arm, Patient Position: Sitting, Cuff Size: Normal)   Pulse 80   Resp 14   Ht 5' 2.5" (1.588 m)   Wt 139 lb (63 kg)   BMI 25.02 kg/m    Physical Exam Eyes:     Conjunctiva/sclera: Conjunctivae normal.  Cardiovascular:     Rate and Rhythm: Normal rate and regular rhythm.  Pulmonary:     Effort: Pulmonary effort is normal.     Breath sounds: Normal breath sounds.  Lymphadenopathy:     Cervical: No cervical adenopathy.  Skin:    General: Skin is warm and dry.     Findings: Bruising present.  Neurological:     Mental Status: She is alert.  Psychiatric:        Mood and Affect: Mood normal.      Musculoskeletal Exam:  Wrists full ROM no tenderness or swelling Distal Heberden's nodes present on both hands,, left 3rd PIP nonreducible joint flexion, squaring of thumb joint no palpable synovitis Knees full ROM no tenderness or swelling  Investigation: No additional findings.  Imaging: No results found.  Recent Labs: Lab Results  Component Value Date   WBC 8.8 07/05/2022   HGB 13.3 07/05/2022   PLT 276 07/05/2022   NA  136 07/31/2022   K 4.7 07/31/2022   CL 95 (L) 07/31/2022   CO2 31 07/31/2022   GLUCOSE 159 (H) 07/31/2022   BUN 38 (H) 07/31/2022   CREATININE 1.49 (H) 07/31/2022   BILITOT 0.5 03/22/2021   ALKPHOS 64 03/22/2021   AST 20 03/22/2021   ALT 17 03/22/2021   PROT 5.6 (L) 03/22/2021   ALBUMIN 2.9 (L) 03/22/2021   CALCIUM 9.1 07/31/2022  GFRAA 47 (L) 11/25/2019    Speciality Comments: No specialty comments available.  Procedures:  No procedures performed Allergies: Ramipril, Cortisone, Niacin, Niacin and related, and Valsartan   Assessment / Plan:     Visit Diagnoses: Tophaceous gout of joint - Uric Acid: 5.3 on 07/05/2022 Acute idiopathic gout, unspecified site  Gout appears to be well-controlled no new major flareups.  Her described symptoms and exam findings are consistent with the generalized osteoarthritis of multiple sites.  Recheck serum uric acid level if it is remaining at goal on once daily treatment can moved to follow-up in the next year.  Stage 3 chronic kidney disease, unspecified whether stage 3a or 3b CKD (HCC) Medication monitoring encounter - febuxostat 40 mg daily  Decreased renal function appears to be her main contributor for the gout.  Has had recent metabolic panel within the past month reviewed with no significant change.  Orders: No orders of the defined types were placed in this encounter.  No orders of the defined types were placed in this encounter.    Follow-Up Instructions: No follow-ups on file.   Fuller Plan, MD  Note - This record has been created using [employer].  Chart creation errors have been sought, but may not always  have been located. Such creation errors do not reflect on  the standard of medical care.

## 2022-12-27 ENCOUNTER — Encounter: Payer: Self-pay | Admitting: Internal Medicine

## 2022-12-27 ENCOUNTER — Ambulatory Visit: Payer: 59 | Attending: Internal Medicine | Admitting: Internal Medicine

## 2022-12-27 VITALS — BP 93/60 | HR 80 | Resp 14 | Ht 62.5 in | Wt 139.0 lb

## 2022-12-27 DIAGNOSIS — M1000 Idiopathic gout, unspecified site: Secondary | ICD-10-CM

## 2022-12-27 DIAGNOSIS — Z5181 Encounter for therapeutic drug level monitoring: Secondary | ICD-10-CM | POA: Diagnosis not present

## 2022-12-27 DIAGNOSIS — M1A9XX1 Chronic gout, unspecified, with tophus (tophi): Secondary | ICD-10-CM | POA: Diagnosis not present

## 2022-12-27 DIAGNOSIS — N183 Chronic kidney disease, stage 3 unspecified: Secondary | ICD-10-CM | POA: Diagnosis not present

## 2022-12-28 LAB — URIC ACID: Uric Acid, Serum: 6 mg/dL (ref 2.5–7.0)

## 2022-12-29 DIAGNOSIS — Z961 Presence of intraocular lens: Secondary | ICD-10-CM | POA: Diagnosis not present

## 2022-12-29 DIAGNOSIS — H401113 Primary open-angle glaucoma, right eye, severe stage: Secondary | ICD-10-CM | POA: Diagnosis not present

## 2022-12-29 DIAGNOSIS — H40022 Open angle with borderline findings, high risk, left eye: Secondary | ICD-10-CM | POA: Diagnosis not present

## 2022-12-29 DIAGNOSIS — H1711 Central corneal opacity, right eye: Secondary | ICD-10-CM | POA: Diagnosis not present

## 2022-12-29 DIAGNOSIS — H16231 Neurotrophic keratoconjunctivitis, right eye: Secondary | ICD-10-CM | POA: Diagnosis not present

## 2023-01-05 ENCOUNTER — Telehealth: Payer: Self-pay | Admitting: *Deleted

## 2023-01-05 MED ORDER — HYDROCODONE-ACETAMINOPHEN 5-325 MG PO TABS: 1.0000 | ORAL_TABLET | Freq: Four times a day (QID) | ORAL | 0 refills | Status: DC | PRN

## 2023-01-05 NOTE — Telephone Encounter (Signed)
Patient notified via voicemail.

## 2023-01-05 NOTE — Telephone Encounter (Signed)
Sabrina Baker called for a refill on her hydrocodone 5/325. Per PMP last fill date 12/07/22 #100.

## 2023-01-08 ENCOUNTER — Telehealth: Payer: Self-pay

## 2023-01-08 ENCOUNTER — Encounter: Payer: Self-pay | Admitting: Registered Nurse

## 2023-01-08 ENCOUNTER — Encounter: Payer: 59 | Attending: Registered Nurse | Admitting: Registered Nurse

## 2023-01-08 VITALS — BP 120/70 | HR 69 | Ht 62.5 in | Wt 142.4 lb

## 2023-01-08 DIAGNOSIS — M542 Cervicalgia: Secondary | ICD-10-CM | POA: Insufficient documentation

## 2023-01-08 DIAGNOSIS — R269 Unspecified abnormalities of gait and mobility: Secondary | ICD-10-CM | POA: Diagnosis not present

## 2023-01-08 DIAGNOSIS — Z5181 Encounter for therapeutic drug level monitoring: Secondary | ICD-10-CM | POA: Diagnosis not present

## 2023-01-08 DIAGNOSIS — G894 Chronic pain syndrome: Secondary | ICD-10-CM | POA: Diagnosis not present

## 2023-01-08 DIAGNOSIS — Z79891 Long term (current) use of opiate analgesic: Secondary | ICD-10-CM | POA: Diagnosis not present

## 2023-01-08 DIAGNOSIS — M48062 Spinal stenosis, lumbar region with neurogenic claudication: Secondary | ICD-10-CM | POA: Diagnosis not present

## 2023-01-08 DIAGNOSIS — M25511 Pain in right shoulder: Secondary | ICD-10-CM | POA: Diagnosis not present

## 2023-01-08 DIAGNOSIS — G8929 Other chronic pain: Secondary | ICD-10-CM | POA: Insufficient documentation

## 2023-01-08 MED ORDER — HYDROCODONE-ACETAMINOPHEN 5-325 MG PO TABS: 1.0000 | ORAL_TABLET | Freq: Four times a day (QID) | ORAL | 0 refills | Status: DC | PRN

## 2023-01-08 NOTE — Progress Notes (Signed)
Subjective:    Patient ID: Sabrina Baker, female    DOB: 10/29/1940, 82 y.o.   MRN: [id number]  HPI: SHAKEIRA PASINSKI is a 82 y.o. female who returns for follow up appointment for chronic pain and medication refill. She states her pain is located in her .neck, right shoulder pain and lower back pain and buttock pain.  She rates her pain 5. Her current exercise regime is walking  short distances with her cane and uses walker at home.  Ms. Ortez Morphine equivalent is 15.00 MME.  Oral Swab was performed today.     Pain Inventory Average Pain 5 Pain Right Now 5 My pain is intermittent and aching  In the last 24 hours, has pain interfered with the following? General activity 5 Relation with others 5 Enjoyment of life 5 What TIME of day is your pain at its worst? varies Sleep (in general) Fair  Pain is worse with: some activites Pain improves with: rest and medication Relief from Meds: 4  No family history on file. Social History   Socioeconomic History   Marital status: Divorced    Spouse name: Not on file   Number of children: Not on file   Years of education: Not on file   Highest education level: Not on file  Occupational History   Not on file  Tobacco Use   Smoking status: Never    Passive exposure: Past   Smokeless tobacco: Never  Vaping Use   Vaping status: Never Used  Substance and Sexual Activity   Alcohol use: No   Drug use: No   Sexual activity: Never    Birth control/protection: Post-menopausal  Other Topics Concern   Not on file  Social History Narrative   Not on file   Social Determinants of Health   Financial Resource Strain: Not on file  Food Insecurity: Not on file  Transportation Needs: Not on file  Physical Activity: Not on file  Stress: Not on file  Social Connections: Unknown (09/07/2022)   Received from [facility]   Social Network    Social Network: Not on file   Past Surgical History:  Procedure Laterality Date   25 GAUGE PARS  PLANA VITRECTOMY WITH 20 GAUGE MVR PORT Right 03/16/2015   Procedure: 25 GAUGE PARS PLANA VITRECTOMY WITH 23 GAUGE MVR PORT;  Surgeon: Sherrie George, MD;  Location: [facility] OR;  Service: Ophthalmology;  Laterality: Right;   ANKLE SURGERY     BREAST BIOPSY Right 06/28/2022   [place] RT BREAST BX W LOC DEV 1ST LESION IMG BX SPEC US GUIDE 06/28/2022 [facility]   BREAST BIOPSY  08/02/2022   MM RT RADIOACTIVE SEED LOC MAMMO GUIDE 08/02/2022 [facility]   BREAST EXCISIONAL BIOPSY     BREAST LUMPECTOMY     BREAST LUMPECTOMY WITH RADIOACTIVE SEED LOCALIZATION Right 01/24/2016   Procedure: RIGHT BREAST LUMPECTOMY WITH RADIOACTIVE SEED LOCALIZATION;  Surgeon: Abigail Miyamoto, MD;  Location: [facility];  Service: General;  Laterality: Right;   BREAST LUMPECTOMY WITH RADIOACTIVE SEED LOCALIZATION Right 08/03/2022   Procedure: RIGHT BREAST LUMPECTOMY WITH RADIOACTIVE SEED LOCALIZATION;  Surgeon: Abigail Miyamoto, MD;  Location: [facility];  Service: General;  Laterality: Right;  LMA   EYE SURGERY     detached retna X2   HEEL SPUR SURGERY  2012   INCISION AND DRAINAGE ABSCESS Right 11/21/2019   Procedure: INCISION AND DRAINAGE FOREHEAD & RIGHT EAR ABSCESS;  Surgeon: Osborn Coho, MD;  Location: [facility] OR;  Service: ENT;  Laterality: Right;   INJECTION OF SILICONE OIL Right 03/16/2015   Procedure: Extraction OF SILICONE OIL;  Surgeon: Sherrie George, MD;  Location: [facility] OR;  Service: Ophthalmology;  Laterality: Right;   ORIF ANKLE FRACTURE Left 06/17/2014   Procedure: OPEN REDUCTION INTERNAL FIXATION (ORIF) LEFT BIMALLEOLAR ANKLE FRACTURE;  Surgeon: Cheral Almas, MD;  Location: [facility] OR;  Service: Orthopedics;  Laterality: Left;   PHOTOCOAGULATION WITH LASER Right 03/16/2015   Procedure: PHOTOCOAGULATION WITH LASER;  Surgeon: Sherrie George, MD;  Location: [facility] OR;  Service: Ophthalmology;  Laterality: Right;   SHOULDER ARTHROSCOPY Right    plates and screws   VITRECTOMY  Right 03/16/2015   WRIST ARTHROPLASTY Left    plates and screws and bone graft   Past Surgical History:  Procedure Laterality Date   25 GAUGE PARS PLANA VITRECTOMY WITH 20 GAUGE MVR PORT Right 03/16/2015   Procedure: 25 GAUGE PARS PLANA VITRECTOMY WITH 23 GAUGE MVR PORT;  Surgeon: Sherrie George, MD;  Location: [facility] OR;  Service: Ophthalmology;  Laterality: Right;   ANKLE SURGERY     BREAST BIOPSY Right 06/28/2022   [place] RT BREAST BX W LOC DEV 1ST LESION IMG BX SPEC US GUIDE 06/28/2022 [facility]   BREAST BIOPSY  08/02/2022   MM RT RADIOACTIVE SEED LOC MAMMO GUIDE 08/02/2022 [facility]   BREAST EXCISIONAL BIOPSY     BREAST LUMPECTOMY     BREAST LUMPECTOMY WITH RADIOACTIVE SEED LOCALIZATION Right 01/24/2016   Procedure: RIGHT BREAST LUMPECTOMY WITH RADIOACTIVE SEED LOCALIZATION;  Surgeon: Abigail Miyamoto, MD;  Location: [facility];  Service: General;  Laterality: Right;   BREAST LUMPECTOMY WITH RADIOACTIVE SEED LOCALIZATION Right 08/03/2022   Procedure: RIGHT BREAST LUMPECTOMY WITH RADIOACTIVE SEED LOCALIZATION;  Surgeon: Abigail Miyamoto, MD;  Location: [facility];  Service: General;  Laterality: Right;  LMA   EYE SURGERY     detached retna X2   HEEL SPUR SURGERY  2012   INCISION AND DRAINAGE ABSCESS Right 11/21/2019   Procedure: INCISION AND DRAINAGE FOREHEAD & RIGHT EAR ABSCESS;  Surgeon: Osborn Coho, MD;  Location: [facility] OR;  Service: ENT;  Laterality: Right;   INJECTION OF SILICONE OIL Right 03/16/2015   Procedure: Extraction OF SILICONE OIL;  Surgeon: Sherrie George, MD;  Location: [facility] OR;  Service: Ophthalmology;  Laterality: Right;   ORIF ANKLE FRACTURE Left 06/17/2014   Procedure: OPEN REDUCTION INTERNAL FIXATION (ORIF) LEFT BIMALLEOLAR ANKLE FRACTURE;  Surgeon: Cheral Almas, MD;  Location: [facility] OR;  Service: Orthopedics;  Laterality: Left;   PHOTOCOAGULATION WITH LASER Right 03/16/2015   Procedure: PHOTOCOAGULATION WITH LASER;   Surgeon: Sherrie George, MD;  Location: [facility] OR;  Service: Ophthalmology;  Laterality: Right;   SHOULDER ARTHROSCOPY Right    plates and screws   VITRECTOMY Right 03/16/2015   WRIST ARTHROPLASTY Left    plates and screws and bone graft   Past Medical History:  Diagnosis Date   Acute diastolic congestive heart failure (HCC)    Breast cancer (HCC)    right breast   Carotid stenosis    Carotid US 1/22: Bilateral ICA 1-39; follow-up as needed   Chronic kidney disease    stage 3   Diabetes mellitus without complication (HCC)    Gout    Hypercholesteremia    Hypertension    Neuromuscular disorder (HCC)    neuropathy   Osteopenia    Retinal detachment    Spinal stenosis    TIA (transient ischemic  attack)    Vitamin B12 deficiency    BP (!) 154/78   Pulse 63   Ht 5' 2.5" (1.588 m)   Wt 142 lb 6.4 oz (64.6 kg)   SpO2 (!) 80%   BMI 25.63 kg/m   Opioid Risk Score:   Fall Risk Score:  `1  Depression screen [facility] 2/9     09/15/2022   11:43 AM 06/06/2022   12:30 PM 04/05/2022   11:06 AM 12/01/2019    1:51 PM  Depression screen PHQ 2/9  Decreased Interest 1 1 1  0  Down, Depressed, Hopeless 1 1 1  0  PHQ - 2 Score 2 2 2  0  Altered sleeping   1   Tired, decreased energy   1   Change in appetite   0   Feeling bad or failure about yourself    0   Trouble concentrating   0   Moving slowly or fidgety/restless   0   Suicidal thoughts   0   PHQ-9 Score   4   Difficult doing work/chores   Somewhat difficult       Review of Systems  Musculoskeletal:  Positive for back pain.       Bilateral leg pain  All other systems reviewed and are negative.     Objective:   Physical Exam Vitals and nursing note reviewed.  Constitutional:      Appearance: Normal appearance.  Neck:     Comments: Cervical Paraspinal Tenderness: C-5-C-6 Cardiovascular:     Rate and Rhythm: Normal rate and regular rhythm.     Pulses: Normal pulses.     Heart sounds: Normal heart sounds.  Pulmonary:      Effort: Pulmonary effort is normal.     Breath sounds: Normal breath sounds.  Musculoskeletal:     Cervical back: Normal range of motion and neck supple.     Comments: Normal Muscle Bulk and Muscle Testing Reveals:  Upper Extremities: Right: Decreased ROM 90 Degrees and Muscle Strength 5/5 Right AC Joint Tenderness Left: Upper Extremity: Full ROM and Muscle Strength 5/5 Lumbar Paraspinal Tenderness: L-4-L-5 Mainly right side  Lower Extremities: Full ROM and Muscle Strength 5/5 Right Knee brace Intact Arises from Table slowly Antalgic Gait     Skin:    General: Skin is warm and dry.  Neurological:     Mental Status: She is alert and oriented to person, place, and time.  Psychiatric:        Mood and Affect: Mood normal.        Behavior: Behavior normal.           Assessment & Plan:  Spinal Stenosis lumbar region with neurogenic with Claudication: She is scheduled for : Bilateral sacroiliac injections under fluoroscopic guidance  Indication: with Dr Wynn Banker : Indication: Low back and buttocks pain not relieved by medication management and other conservative care. 2. Cervicalgia: Continue HEP as tolerated. Continue to Monitor.  3.Chronic Right Shoulder Pain: Continue HEP as Tolerated. Continue to Monitor.  4. Abnormality Gait: Continue to use assistive device : Cane and walker. Continue to Monitor.   F/U in 1 month with Dr Wynn Banker

## 2023-01-08 NOTE — Telephone Encounter (Signed)
Sabrina Baker called stated the [facility] in [facility] does have the Hydrocodone 5-325 in stock.

## 2023-01-09 ENCOUNTER — Telehealth: Payer: Self-pay

## 2023-01-09 NOTE — Progress Notes (Signed)
Uric acid 6.0 this is at goal just at the upper limit. Okay to continue current allopurinol 100 mg once daily and f/u next year.

## 2023-01-09 NOTE — Telephone Encounter (Signed)
Patient contacted the office and inquired about her lab results from 12/27/2022. After reviewing the chart, there was a uric acid drawn. Patient states she has an appointment with her kidney doctor near the end of the month and would like the result sent there. Patient call back number is [phone]. Please advise.

## 2023-01-09 NOTE — Telephone Encounter (Signed)
Patient advised in lab result note.

## 2023-01-09 NOTE — Telephone Encounter (Signed)
Now addressed in associated result note. If she has the nephrology office contact information would be fine to send this.

## 2023-01-09 NOTE — Progress Notes (Signed)
FYI- Correcting documentation current regimen uloric 40 mg once daily, not allopurinol

## 2023-01-16 ENCOUNTER — Encounter (INDEPENDENT_AMBULATORY_CARE_PROVIDER_SITE_OTHER): Payer: 59 | Admitting: Ophthalmology

## 2023-01-16 DIAGNOSIS — E113312 Type 2 diabetes mellitus with moderate nonproliferative diabetic retinopathy with macular edema, left eye: Secondary | ICD-10-CM

## 2023-01-16 DIAGNOSIS — H338 Other retinal detachments: Secondary | ICD-10-CM | POA: Diagnosis not present

## 2023-01-16 DIAGNOSIS — I10 Essential (primary) hypertension: Secondary | ICD-10-CM

## 2023-01-16 DIAGNOSIS — Z794 Long term (current) use of insulin: Secondary | ICD-10-CM

## 2023-01-16 DIAGNOSIS — E113591 Type 2 diabetes mellitus with proliferative diabetic retinopathy without macular edema, right eye: Secondary | ICD-10-CM | POA: Diagnosis not present

## 2023-01-16 DIAGNOSIS — H35033 Hypertensive retinopathy, bilateral: Secondary | ICD-10-CM | POA: Diagnosis not present

## 2023-01-16 DIAGNOSIS — H43812 Vitreous degeneration, left eye: Secondary | ICD-10-CM | POA: Diagnosis not present

## 2023-01-19 ENCOUNTER — Ambulatory Visit: Payer: 59 | Admitting: Physical Medicine & Rehabilitation

## 2023-01-31 ENCOUNTER — Ambulatory Visit: Payer: 59 | Admitting: Podiatry

## 2023-01-31 ENCOUNTER — Encounter: Payer: Self-pay | Admitting: Podiatry

## 2023-01-31 DIAGNOSIS — M79674 Pain in right toe(s): Secondary | ICD-10-CM | POA: Diagnosis not present

## 2023-01-31 DIAGNOSIS — E1142 Type 2 diabetes mellitus with diabetic polyneuropathy: Secondary | ICD-10-CM | POA: Diagnosis not present

## 2023-01-31 DIAGNOSIS — M79675 Pain in left toe(s): Secondary | ICD-10-CM

## 2023-01-31 DIAGNOSIS — B351 Tinea unguium: Secondary | ICD-10-CM | POA: Diagnosis not present

## 2023-01-31 NOTE — Progress Notes (Signed)
  Subjective:  Patient ID: Sabrina Baker, female    DOB: [birth year],  MRN: [id number]  Chief Complaint  Patient presents with   Diabetes    RM12: Patient is here for a follow up for chronic conditions for [facility]    82 y.o. female returns for the above complaint.  Patient presents with thickened elongated dystrophic toenails x10.  Pain on palpation.  Patient would like for me to debride down which is not able to do it herself.  She denies any other acute complaints.  Objective:  There were no vitals filed for this visit. Podiatric Exam: Vascular: dorsalis pedis and posterior tibial pulses are palpable bilateral. Capillary return is immediate. Temperature gradient is WNL. Skin turgor WNL  Sensorium: Normal Semmes Weinstein monofilament test. Normal tactile sensation bilaterally. Nail Exam: Pt has thick disfigured discolored nails with subungual debris noted bilateral entire nail hallux through fifth toenails.  Pain on palpation to the nails. Ulcer Exam: There is no evidence of ulcer or pre-ulcerative changes or infection. Orthopedic Exam: Muscle tone and strength are WNL. No limitations in general ROM. No crepitus or effusions noted. HAV  B/L.  Hammer toes 2-5  B/L. Skin: No Porokeratosis. No infection or ulcers    Assessment & Plan:   1. Pain due to onychomycosis of toenails of both feet   2. Diabetic peripheral neuropathy associated with type 2 diabetes mellitus (HCC)       Patient was evaluated and treated and all questions answered.  Hammertoes 2 through 5 bilateral -I explained to the patient the etiology of hammertoe contractures and various treatment options were discussed.  Given the amount of pain she is having I believe patient will benefit from diabetic shoes to prevent ulceration given the contractures.   -Diabetic shoes are functioning well.   Onychomycosis with pain  -Nails palliatively debrided as below. -Educated on self-care  Procedure: Nail  Debridement Rationale: pain  Type of Debridement: manual, sharp debridement. Instrumentation: Nail nipper, rotary burr. Number of Nails: 10  Procedures and Treatment: Consent by patient was obtained for treatment procedures. The patient understood the discussion of treatment and procedures well. All questions were answered thoroughly reviewed. Debridement of mycotic and hypertrophic toenails, 1 through 5 bilateral and clearing of subungual debris. No ulceration, no infection noted.  Return Visit-Office Procedure: Patient instructed to return to the office for a follow up visit 3 months for continued evaluation and treatment.  Nicholes Rough, DPM    No follow-ups on file.

## 2023-02-05 ENCOUNTER — Telehealth: Payer: Self-pay | Admitting: Physical Medicine & Rehabilitation

## 2023-02-05 DIAGNOSIS — N1832 Chronic kidney disease, stage 3b: Secondary | ICD-10-CM | POA: Diagnosis not present

## 2023-02-05 NOTE — Telephone Encounter (Signed)
[facility] They told patient rx for 10-325 could be sent and she can cut in half  [facility] [place].

## 2023-02-05 NOTE — Telephone Encounter (Signed)
Need refill for hydrocodone. [facility] [place] [facility] of [facility] 10-325, patient can cut the 10 in half to make it 5

## 2023-02-07 DIAGNOSIS — E1122 Type 2 diabetes mellitus with diabetic chronic kidney disease: Secondary | ICD-10-CM | POA: Diagnosis not present

## 2023-02-07 DIAGNOSIS — I129 Hypertensive chronic kidney disease with stage 1 through stage 4 chronic kidney disease, or unspecified chronic kidney disease: Secondary | ICD-10-CM | POA: Diagnosis not present

## 2023-02-07 DIAGNOSIS — D631 Anemia in chronic kidney disease: Secondary | ICD-10-CM | POA: Diagnosis not present

## 2023-02-07 DIAGNOSIS — N1832 Chronic kidney disease, stage 3b: Secondary | ICD-10-CM | POA: Diagnosis not present

## 2023-02-07 DIAGNOSIS — N2581 Secondary hyperparathyroidism of renal origin: Secondary | ICD-10-CM | POA: Diagnosis not present

## 2023-02-08 ENCOUNTER — Encounter: Payer: Self-pay | Admitting: Physical Medicine & Rehabilitation

## 2023-02-08 ENCOUNTER — Encounter: Payer: 59 | Attending: Registered Nurse | Admitting: Physical Medicine & Rehabilitation

## 2023-02-08 ENCOUNTER — Telehealth: Payer: Self-pay | Admitting: Registered Nurse

## 2023-02-08 VITALS — BP 155/80 | HR 70 | Ht 62.5 in | Wt 145.2 lb

## 2023-02-08 DIAGNOSIS — G8929 Other chronic pain: Secondary | ICD-10-CM | POA: Insufficient documentation

## 2023-02-08 DIAGNOSIS — M533 Sacrococcygeal disorders, not elsewhere classified: Secondary | ICD-10-CM | POA: Insufficient documentation

## 2023-02-08 MED ORDER — BETAMETHASONE SOD PHOS & ACET 6 (3-3) MG/ML IJ SUSP
12.0000 mg | Freq: Once | INTRAMUSCULAR | Status: AC
Start: 2023-02-08 — End: 2023-02-08
  Administered 2023-02-08: 6 mg

## 2023-02-08 MED ORDER — LIDOCAINE HCL (PF) 2 % IJ SOLN
2.0000 mL | Freq: Once | INTRAMUSCULAR | Status: AC
Start: 2023-02-08 — End: 2023-02-08
  Administered 2023-02-08: 2 mL

## 2023-02-08 MED ORDER — IOHEXOL 180 MG/ML  SOLN
2.0000 mL | Freq: Once | INTRAMUSCULAR | Status: AC
  Administered 2023-02-08: 2 mL

## 2023-02-08 MED ORDER — HYDROCODONE-ACETAMINOPHEN 7.5-325 MG PO TABS: 1.0000 | ORAL_TABLET | Freq: Three times a day (TID) | ORAL | 0 refills | Status: DC | PRN

## 2023-02-08 MED ORDER — LIDOCAINE HCL 1 % IJ SOLN
5.0000 mL | Freq: Once | INTRAMUSCULAR | Status: AC
Start: 2023-02-08 — End: 2023-02-08
  Administered 2023-02-08: 5 mL

## 2023-02-08 NOTE — Progress Notes (Signed)
  PROCEDURE RECORD [facility]   Name: Sabrina Baker DOB:10/13/1940 MRN: [id number]  Date:02/08/2023  Physician: Claudette Laws, MD    Nurse/CMA: Zedric Deroy RN  Allergies:  Allergies  Allergen Reactions   Ramipril Anaphylaxis and Other (See Comments)   Cortisone Itching and Rash   Niacin Diarrhea    And nausea   Niacin And Related Diarrhea   Valsartan Other (See Comments)    Hyperkalemia     Consent Signed: Yes.    Is patient diabetic? Yes.    CBG today? 113  Pregnant: No. LMP: No LMP recorded. Patient is postmenopausal. (age 48-55)  Anticoagulants: yes (eliquis) Anti-inflammatory: no Antibiotics: no  Procedure: bilateral sacroiliac steroid injections Position: Prone Start Time: 3:04 End Time: 3:12 Fluoro Time: 24 sec  RN/CMA Designer, multimedia    Time 2 45 3 18    BP 155/80 163/75    Pulse 70 77    Respirations 16 16    O2 Sat 91 90    S/S 6 6    Pain Level 5 2     D/C home with transport, patient A & O X 3, D/C instructions reviewed, and sits independently.         Subjective:    Patient ID: Sabrina Baker, female    DOB: 1940/07/06, 82 y.o.   MRN: [id number]  HPI    Review of Systems     Objective:   Physical Exam        Assessment & Plan:

## 2023-02-08 NOTE — Telephone Encounter (Signed)
PMP was Reviewed.  [facility] , [facility]one 5/325 is out of stock.  Hydrocodone 5mg /325 prescription was canceled.  Hydrocodone 7.5/325 prescription sent to pharmacy.  Call placed to Ms. Jay regarding the above, she verbalizes understanding.  Ms. Stepanski states she currently has Hydrocodone 5/ 325 8 tablets.  She has a scheduled appointment with Dr Wynn Banker today.

## 2023-02-08 NOTE — Progress Notes (Deleted)
   Subjective:    Patient ID: Sabrina Baker, female    DOB: [birth year], 82 y.o.   MRN: [id number]  HPI    Review of Systems     Objective:   Physical Exam        Assessment & Plan:

## 2023-02-08 NOTE — Patient Instructions (Signed)
Sacroiliac injection was performed today. A combination of numbing medicine (lidocaine) plus a cortisone medicine (betamethasone) was injected. The injection was done under x-ray guidance. This procedure has been performed to help reduce low back and buttocks pain as well as potentially hip pain. The duration of this injection is variable lasting from hours to  Months. It may repeated if needed. 

## 2023-02-08 NOTE — Progress Notes (Signed)
Bilateral sacroiliac injections under fluoroscopic guidance  Indication: Low back and buttocks pain not relieved by medication management and other conservative care.  Informed consent was obtained after describing risks and benefits of the procedure with the patient, this includes bleeding, bruising, infection, paralysis and medication side effects. The patient wishes to proceed and has given written consent. The patient was placed in a prone position. The lumbar and sacral area was marked and prepped with Betadine. A 25-gauge 1-1/2 inch needle was inserted into the skin and subcutaneous tissue and 1 mL of 1% lidocaine was injected into each side. Then a 25-gauge 3 inch spinal needle was inserted under fluoroscopic guidance into the left sacroiliac joint. AP and lateral images were utilized. Omnipaque 180x0.5 mL under live fluoroscopy demonstrated no intravascular uptake. Then a solution containing one ML of 6 mg per mL Celestone in 2 ML of 2% lidocaine MPF was injected x1.5 mL. This same procedure was repeated on the right side using the same needle, injectate, and technique. Patient tolerated the procedure well. Post procedure instructions were given. Please see post procedure form.  Meds: Celestone 6mg  Omnipaque 1ml Lidocaine 1% 5ml Lidocaine 2% PF 2 ml

## 2023-02-15 ENCOUNTER — Encounter (INDEPENDENT_AMBULATORY_CARE_PROVIDER_SITE_OTHER): Payer: 59 | Admitting: Ophthalmology

## 2023-02-15 DIAGNOSIS — H338 Other retinal detachments: Secondary | ICD-10-CM | POA: Diagnosis not present

## 2023-02-15 DIAGNOSIS — H43812 Vitreous degeneration, left eye: Secondary | ICD-10-CM

## 2023-02-15 DIAGNOSIS — H35033 Hypertensive retinopathy, bilateral: Secondary | ICD-10-CM | POA: Diagnosis not present

## 2023-02-15 DIAGNOSIS — Z794 Long term (current) use of insulin: Secondary | ICD-10-CM

## 2023-02-15 DIAGNOSIS — E113591 Type 2 diabetes mellitus with proliferative diabetic retinopathy without macular edema, right eye: Secondary | ICD-10-CM | POA: Diagnosis not present

## 2023-02-15 DIAGNOSIS — E113312 Type 2 diabetes mellitus with moderate nonproliferative diabetic retinopathy with macular edema, left eye: Secondary | ICD-10-CM

## 2023-02-16 DIAGNOSIS — C50911 Malignant neoplasm of unspecified site of right female breast: Secondary | ICD-10-CM | POA: Diagnosis not present

## 2023-02-21 ENCOUNTER — Other Ambulatory Visit: Payer: Self-pay | Admitting: Family Medicine

## 2023-02-21 DIAGNOSIS — Z23 Encounter for immunization: Secondary | ICD-10-CM | POA: Diagnosis not present

## 2023-02-21 DIAGNOSIS — D6869 Other thrombophilia: Secondary | ICD-10-CM | POA: Diagnosis not present

## 2023-02-21 DIAGNOSIS — E785 Hyperlipidemia, unspecified: Secondary | ICD-10-CM | POA: Diagnosis not present

## 2023-02-21 DIAGNOSIS — N183 Chronic kidney disease, stage 3 unspecified: Secondary | ICD-10-CM | POA: Diagnosis not present

## 2023-02-21 DIAGNOSIS — E113491 Type 2 diabetes mellitus with severe nonproliferative diabetic retinopathy without macular edema, right eye: Secondary | ICD-10-CM | POA: Diagnosis not present

## 2023-02-21 DIAGNOSIS — R0989 Other specified symptoms and signs involving the circulatory and respiratory systems: Secondary | ICD-10-CM

## 2023-02-21 DIAGNOSIS — N2581 Secondary hyperparathyroidism of renal origin: Secondary | ICD-10-CM | POA: Diagnosis not present

## 2023-02-21 DIAGNOSIS — E538 Deficiency of other specified B group vitamins: Secondary | ICD-10-CM | POA: Diagnosis not present

## 2023-02-21 DIAGNOSIS — I130 Hypertensive heart and chronic kidney disease with heart failure and stage 1 through stage 4 chronic kidney disease, or unspecified chronic kidney disease: Secondary | ICD-10-CM | POA: Diagnosis not present

## 2023-02-21 DIAGNOSIS — Z0000 Encounter for general adult medical examination without abnormal findings: Secondary | ICD-10-CM | POA: Diagnosis not present

## 2023-02-21 DIAGNOSIS — I480 Paroxysmal atrial fibrillation: Secondary | ICD-10-CM | POA: Diagnosis not present

## 2023-02-21 DIAGNOSIS — E1122 Type 2 diabetes mellitus with diabetic chronic kidney disease: Secondary | ICD-10-CM | POA: Diagnosis not present

## 2023-02-21 DIAGNOSIS — I700 Atherosclerosis of aorta: Secondary | ICD-10-CM | POA: Diagnosis not present

## 2023-02-21 DIAGNOSIS — M8588 Other specified disorders of bone density and structure, other site: Secondary | ICD-10-CM | POA: Diagnosis not present

## 2023-02-21 DIAGNOSIS — Z79899 Other long term (current) drug therapy: Secondary | ICD-10-CM | POA: Diagnosis not present

## 2023-02-21 DIAGNOSIS — I129 Hypertensive chronic kidney disease with stage 1 through stage 4 chronic kidney disease, or unspecified chronic kidney disease: Secondary | ICD-10-CM | POA: Diagnosis not present

## 2023-02-21 DIAGNOSIS — E1142 Type 2 diabetes mellitus with diabetic polyneuropathy: Secondary | ICD-10-CM | POA: Diagnosis not present

## 2023-03-07 ENCOUNTER — Other Ambulatory Visit: Payer: Self-pay | Admitting: Family Medicine

## 2023-03-07 ENCOUNTER — Ambulatory Visit
Admission: RE | Admit: 2023-03-07 | Discharge: 2023-03-07 | Disposition: A | Discharge status: Home / Self Care | Payer: 59 | Source: Ambulatory Visit | Attending: Family Medicine | Admitting: Family Medicine

## 2023-03-07 DIAGNOSIS — R0989 Other specified symptoms and signs involving the circulatory and respiratory systems: Secondary | ICD-10-CM

## 2023-03-12 ENCOUNTER — Telehealth: Payer: Self-pay | Admitting: Specialist

## 2023-03-12 NOTE — Telephone Encounter (Signed)
Patient is a patient of Dr. Berline Chough. Call back number is [phone], her current number on file is not working. She has a question about her prescription for hydrocodone-acetaminophen Please call her back

## 2023-03-13 DIAGNOSIS — E1165 Type 2 diabetes mellitus with hyperglycemia: Secondary | ICD-10-CM | POA: Diagnosis not present

## 2023-03-13 DIAGNOSIS — Z794 Long term (current) use of insulin: Secondary | ICD-10-CM | POA: Diagnosis not present

## 2023-03-13 MED ORDER — HYDROCODONE-ACETAMINOPHEN 7.5-325 MG PO TABS: 1.0000 | ORAL_TABLET | Freq: Three times a day (TID) | ORAL | 0 refills | Status: DC | PRN

## 2023-03-13 NOTE — Telephone Encounter (Signed)
Pt came into the office and said she has 5 left she wanted to make sure to let the doctor know

## 2023-03-13 NOTE — Telephone Encounter (Signed)
Pt notified that prescription has been sent to pharmacy.

## 2023-03-21 ENCOUNTER — Encounter (INDEPENDENT_AMBULATORY_CARE_PROVIDER_SITE_OTHER): Payer: 59 | Admitting: Ophthalmology

## 2023-03-21 DIAGNOSIS — I10 Essential (primary) hypertension: Secondary | ICD-10-CM

## 2023-03-21 DIAGNOSIS — H35033 Hypertensive retinopathy, bilateral: Secondary | ICD-10-CM | POA: Diagnosis not present

## 2023-03-21 DIAGNOSIS — E113312 Type 2 diabetes mellitus with moderate nonproliferative diabetic retinopathy with macular edema, left eye: Secondary | ICD-10-CM

## 2023-03-21 DIAGNOSIS — H43812 Vitreous degeneration, left eye: Secondary | ICD-10-CM | POA: Diagnosis not present

## 2023-03-21 DIAGNOSIS — E113591 Type 2 diabetes mellitus with proliferative diabetic retinopathy without macular edema, right eye: Secondary | ICD-10-CM

## 2023-03-21 DIAGNOSIS — H338 Other retinal detachments: Secondary | ICD-10-CM

## 2023-03-21 DIAGNOSIS — Z794 Long term (current) use of insulin: Secondary | ICD-10-CM | POA: Diagnosis not present

## 2023-04-04 DIAGNOSIS — Z794 Long term (current) use of insulin: Secondary | ICD-10-CM | POA: Diagnosis not present

## 2023-04-04 DIAGNOSIS — E1165 Type 2 diabetes mellitus with hyperglycemia: Secondary | ICD-10-CM | POA: Diagnosis not present

## 2023-04-09 ENCOUNTER — Telehealth: Payer: Self-pay | Admitting: Physical Medicine and Rehabilitation

## 2023-04-09 NOTE — Telephone Encounter (Signed)
Patient called in requesting a medication refill on HYDROcodone-acetaminophen (NORCO) 7.5-325 MG tablet , patient has an appointment 10/30 but only has 2 left and if approved [facility] [place]

## 2023-04-10 MED ORDER — HYDROCODONE-ACETAMINOPHEN 7.5-325 MG PO TABS: 1.0000 | ORAL_TABLET | Freq: Three times a day (TID) | ORAL | 0 refills | Status: DC | PRN

## 2023-04-10 NOTE — Addendum Note (Signed)
Addended by: Genice Rouge on: 04/10/2023 10:32 AM   Modules accepted: Orders

## 2023-04-11 ENCOUNTER — Encounter: Payer: Self-pay | Admitting: Physical Medicine and Rehabilitation

## 2023-04-11 ENCOUNTER — Encounter: Payer: 59 | Attending: Registered Nurse | Admitting: Physical Medicine and Rehabilitation

## 2023-04-11 VITALS — BP 90/56 | HR 68 | Ht 62.5 in | Wt 144.0 lb

## 2023-04-11 DIAGNOSIS — M4800 Spinal stenosis, site unspecified: Secondary | ICD-10-CM | POA: Diagnosis not present

## 2023-04-11 DIAGNOSIS — M48062 Spinal stenosis, lumbar region with neurogenic claudication: Secondary | ICD-10-CM | POA: Insufficient documentation

## 2023-04-11 DIAGNOSIS — M533 Sacrococcygeal disorders, not elsewhere classified: Secondary | ICD-10-CM | POA: Diagnosis not present

## 2023-04-11 DIAGNOSIS — G8929 Other chronic pain: Secondary | ICD-10-CM | POA: Diagnosis not present

## 2023-04-11 MED ORDER — HYDROCODONE-ACETAMINOPHEN 7.5-325 MG PO TABS: 1.0000 | ORAL_TABLET | Freq: Three times a day (TID) | ORAL | 0 refills | Status: DC | PRN

## 2023-04-11 MED ORDER — GABAPENTIN 400 MG PO CAPS: 400.0000 mg | ORAL_CAPSULE | Freq: Three times a day (TID) | ORAL | 5 refills | Status: DC

## 2023-04-11 MED ORDER — HYDROCODONE-ACETAMINOPHEN 7.5-325 MG PO TABS: 1.0000 | ORAL_TABLET | Freq: Four times a day (QID) | ORAL | 0 refills | Status: DC | PRN

## 2023-04-11 NOTE — Patient Instructions (Signed)
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  Hx of Charcot feet B/L Here for f/u for chronic pain.    O2 sats initially 86%- came up to 94%- doing better- feels the same- so was from cold hands?   2. Con't gabapentin 400 mg up to 3x/day- sent in 6 refills  3. Just got Norco 7.5/325 mg #90- will send in refill since just got yesterday.    4. Not due for opiate contract today.   5. Needs to see Riley Lam in 2 months and 4 months and me in 6 months.

## 2023-04-11 NOTE — Progress Notes (Addendum)
Subjective:    Patient ID: Sabrina Baker, female    DOB: August 03, 1940, 82 y.o.   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  Hx of Charcot feet B/L Here for f/u for chronic pain.  Finished up playing volleyball Stands or sitting  taking up tickets  for tournaments- for entire day- 12 hours day. For 3 days at a time.   Had SI joint injection 02/08/23- by Dr Wynn Banker- lasted 2 months-  Does help when has it. Has been done with Celestone.   Has more pain to R side than L side .   Landed more on R side when had fall in kitchen - early last year- slid out of chair in kitchen at home early 2023.   Now has new dx of CHF-  Goes to Cardiology Monday-   Depends on what doing, gets more tired with activities Doesn't get SOB often-    On Norco 7.5/325 mg 3x/day as needed - just got dose yesterday- 2-4x/day- usually when has tournaments- takes it 4x/day.   Also on gabapentin 400 mg nightly- and sometimes 2x/day- never 3x/day-      Pain Inventory Average Pain 5 Pain Right Now 5 My pain is intermittent and aching  In the last 24 hours, has pain interfered with the following? General activity 5 Relation with others 5 Enjoyment of life 5 What TIME of day is your pain at its worst? varies Sleep (in general) Fair  Pain is worse with: some activites Pain improves with: rest and medication Relief from Meds: 4  No family history on file. Social History   Socioeconomic History   Marital status: Divorced    Spouse name: Not on file   Number of children: Not on file   Years of education: Not on file   Highest education level: Not on file  Occupational History   Not on file  Tobacco Use   Smoking status: Never    Passive exposure: Past   Smokeless tobacco: Never  Vaping Use   Vaping status:  Never Used  Substance and Sexual Activity   Alcohol use: No   Drug use: No   Sexual activity: Never    Birth control/protection: Post-menopausal  Other Topics Concern   Not on file  Social History Narrative   Not on file   Social Determinants of Health   Financial Resource Strain: Not on file  Food Insecurity: Not on file  Transportation Needs: Not on file  Physical Activity: Not on file  Stress: Not on file  Social Connections: Unknown (09/07/2022)   Received from [facility], [facility]   Social Network    Social Network: Not on file   Past Surgical History:  Procedure Laterality Date   25 GAUGE PARS PLANA VITRECTOMY WITH 20 GAUGE MVR PORT Right 03/16/2015   Procedure: 25 GAUGE PARS PLANA VITRECTOMY WITH 23 GAUGE MVR PORT;  Surgeon: Sherrie George, MD;  Location: [facility] OR;  Service: Ophthalmology;  Laterality: Right;   ANKLE SURGERY     BREAST BIOPSY Right 06/28/2022   [place] RT BREAST BX W LOC DEV 1ST LESION IMG BX SPEC US GUIDE 06/28/2022 [facility]   BREAST BIOPSY  08/02/2022   MM RT RADIOACTIVE SEED LOC MAMMO GUIDE 08/02/2022 [facility]   BREAST  EXCISIONAL BIOPSY     BREAST LUMPECTOMY     BREAST LUMPECTOMY WITH RADIOACTIVE SEED LOCALIZATION Right 01/24/2016   Procedure: RIGHT BREAST LUMPECTOMY WITH RADIOACTIVE SEED LOCALIZATION;  Surgeon: Abigail Miyamoto, MD;  Location: [facility];  Service: General;  Laterality: Right;   BREAST LUMPECTOMY WITH RADIOACTIVE SEED LOCALIZATION Right 08/03/2022   Procedure: RIGHT BREAST LUMPECTOMY WITH RADIOACTIVE SEED LOCALIZATION;  Surgeon: Abigail Miyamoto, MD;  Location: [facility];  Service: General;  Laterality: Right;  LMA   EYE SURGERY     detached retna X2   HEEL SPUR SURGERY  2012   INCISION AND DRAINAGE ABSCESS Right 11/21/2019   Procedure: INCISION AND DRAINAGE FOREHEAD & RIGHT EAR ABSCESS;  Surgeon: Osborn Coho, MD;  Location: [facility] OR;  Service: ENT;  Laterality: Right;   INJECTION  OF SILICONE OIL Right 03/16/2015   Procedure: Extraction OF SILICONE OIL;  Surgeon: Sherrie George, MD;  Location: [facility] OR;  Service: Ophthalmology;  Laterality: Right;   ORIF ANKLE FRACTURE Left 06/17/2014   Procedure: OPEN REDUCTION INTERNAL FIXATION (ORIF) LEFT BIMALLEOLAR ANKLE FRACTURE;  Surgeon: Cheral Almas, MD;  Location: [facility] OR;  Service: Orthopedics;  Laterality: Left;   PHOTOCOAGULATION WITH LASER Right 03/16/2015   Procedure: PHOTOCOAGULATION WITH LASER;  Surgeon: Sherrie George, MD;  Location: [facility] OR;  Service: Ophthalmology;  Laterality: Right;   SHOULDER ARTHROSCOPY Right    plates and screws   VITRECTOMY Right 03/16/2015   WRIST ARTHROPLASTY Left    plates and screws and bone graft   Past Surgical History:  Procedure Laterality Date   25 GAUGE PARS PLANA VITRECTOMY WITH 20 GAUGE MVR PORT Right 03/16/2015   Procedure: 25 GAUGE PARS PLANA VITRECTOMY WITH 23 GAUGE MVR PORT;  Surgeon: Sherrie George, MD;  Location: [facility] OR;  Service: Ophthalmology;  Laterality: Right;   ANKLE SURGERY     BREAST BIOPSY Right 06/28/2022   [place] RT BREAST BX W LOC DEV 1ST LESION IMG BX SPEC US GUIDE 06/28/2022 [facility]   BREAST BIOPSY  08/02/2022   MM RT RADIOACTIVE SEED LOC MAMMO GUIDE 08/02/2022 [facility]   BREAST EXCISIONAL BIOPSY     BREAST LUMPECTOMY     BREAST LUMPECTOMY WITH RADIOACTIVE SEED LOCALIZATION Right 01/24/2016   Procedure: RIGHT BREAST LUMPECTOMY WITH RADIOACTIVE SEED LOCALIZATION;  Surgeon: Abigail Miyamoto, MD;  Location: [facility];  Service: General;  Laterality: Right;   BREAST LUMPECTOMY WITH RADIOACTIVE SEED LOCALIZATION Right 08/03/2022   Procedure: RIGHT BREAST LUMPECTOMY WITH RADIOACTIVE SEED LOCALIZATION;  Surgeon: Abigail Miyamoto, MD;  Location: [facility];  Service: General;  Laterality: Right;  LMA   EYE SURGERY     detached retna X2   HEEL SPUR SURGERY  2012   INCISION AND DRAINAGE ABSCESS Right 11/21/2019    Procedure: INCISION AND DRAINAGE FOREHEAD & RIGHT EAR ABSCESS;  Surgeon: Osborn Coho, MD;  Location: [facility] OR;  Service: ENT;  Laterality: Right;   INJECTION OF SILICONE OIL Right 03/16/2015   Procedure: Extraction OF SILICONE OIL;  Surgeon: Sherrie George, MD;  Location: [facility] OR;  Service: Ophthalmology;  Laterality: Right;   ORIF ANKLE FRACTURE Left 06/17/2014   Procedure: OPEN REDUCTION INTERNAL FIXATION (ORIF) LEFT BIMALLEOLAR ANKLE FRACTURE;  Surgeon: Cheral Almas, MD;  Location: [facility] OR;  Service: Orthopedics;  Laterality: Left;   PHOTOCOAGULATION WITH LASER Right 03/16/2015   Procedure: PHOTOCOAGULATION WITH LASER;  Surgeon: Sherrie George, MD;  Location: [facility] OR;  Service: Ophthalmology;  Laterality: Right;   SHOULDER ARTHROSCOPY Right    plates and screws   VITRECTOMY Right 03/16/2015   WRIST ARTHROPLASTY Left    plates and screws and bone graft   Past Medical History:  Diagnosis Date   Acute diastolic congestive heart failure (HCC)    Breast cancer (HCC)    right breast   Carotid stenosis    Carotid US 1/22: Bilateral ICA 1-39; follow-up as needed   Chronic kidney disease    stage 3   Diabetes mellitus without complication (HCC)    Gout    Hypercholesteremia    Hypertension    Neuromuscular disorder (HCC)    neuropathy   Osteopenia    Retinal detachment    Spinal stenosis    TIA (transient ischemic attack)    Vitamin B12 deficiency    BP (!) 90/56   Pulse 68   Ht 5' 2.5" (1.588 m)   Wt 144 lb (65.3 kg)   BMI 25.92 kg/m   Opioid Risk Score:   Fall Risk Score:  `1  Depression screen [facility] 2/9     02/08/2023    2:47 PM 09/15/2022   11:43 AM 06/06/2022   12:30 PM 04/05/2022   11:06 AM 12/01/2019    1:51 PM  Depression screen PHQ 2/9  Decreased Interest 0 1 1 1  0  Down, Depressed, Hopeless 0 1 1 1  0  PHQ - 2 Score 0 2 2 2  0  Altered sleeping    1   Tired, decreased energy    1   Change in appetite    0   Feeling bad or failure about yourself     0    Trouble concentrating    0   Moving slowly or fidgety/restless    0   Suicidal thoughts    0   PHQ-9 Score    4   Difficult doing work/chores    Somewhat difficult      Review of Systems  Musculoskeletal:  Positive for back pain.       Bilateral leg pain  All other systems reviewed and are negative.     Objective:   Physical Exam  Awake, alert, appropriate, using Rolator to get around; NAD Wearing a "be active" brace on R leg below R knee TTP across lateral aspect of R calf TTP across low back in band TTP on SI joint- said SI joint injection has worn off- more TTP on R side than L side  Compression test and distraction slightly positive still B/L Tophus on R thumb- arthritis on L hand Charcot feet evident on exam Rocker bottom looking feet TTP with enlarged lateral malleolus      Assessment & Plan:   Pt is an 82 yr old with hx of Afib, HTN , DM with diabetic neuropathy, , CKD3; HLD, chronic dCHF, GERD, gout,  anemia- chronic; and 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  Hx of Charcot feet B/L Here for f/u for chronic pain.    O2 sats initially 86%- came up to 94%- doing better- feels the same- so was from cold hands?   2. Con't gabapentin 400 mg up to 3x/day- sent in 6 refills  3. Just got Norco 7.5/325 mg #90- will send in refill since just got yesterday.    4. Not due for opiate contract today.   5. Needs to see Riley Lam in 2 months and 4 months and me in 6 months.  6. TO see Dr Wynn Banker beginning of December for SI joint injections.     I spent a total of  21  minutes on total care today- >50% coordination of care- due to d/w pt about being seen more often and refilling pain meds 2 Rx's- also d/w pt about her new dx of CHF and gout and arthritis issues

## 2023-04-15 ENCOUNTER — Encounter: Payer: Self-pay | Admitting: Cardiovascular Disease

## 2023-04-15 NOTE — Progress Notes (Unsigned)
Cardiology Office Note:    Date:  04/16/2023   ID:  Sabrina Baker, DOB May 29, 1941, MRN [id number]  PCP:  Laurann Montana, MD  Cardiologist:  Nadene Witherspoon  Electrophysiologist:  None   Referring MD: Laurann Montana, MD   Chief Complaint  Patient presents with   Atrial Fibrillation   Congestive Heart Failure         Problem List 1. Paroxysmal atrial fib: CHADS2VASC = 7 ( female, age, TIA, HTN, DM)   2.  HTN 3, Covid 19 infection , April 2020  4.  DM  History of Present Illness:    Sabrina Baker is a 82 y.o. female with a  Recent hospitalization for COVID-19.  She had a prolonged hospitalization involving prolonged intubation.  She had paroxysmal atrial fibrillation.  She was placed on Eliquis at that time.  She was discharged in normal sinus rhythm.  She has been to [facility], [facility], [facility] She developed atrial fib while she was intubated and sick with COVID 19 . Has improved quite a bit since then.   Breathing has improved.  Has an occasional cough .   Her son died in 11-07-22 of this year of CHF.   We discussed this   [name] 18, 2021 Sabrina Baker is seen today for follow up of her PAF, hyperlipidemia, and HTN. Her episode of PAF was related to a COVID infection Exercises some,  Has been doing some walking . Still fatigued from her covid .   Has occasional twinges in her left chest  Reports some swelling of her right leg, goes down at night  Is on eliquis so unlikely to be a DVT . No further arrhythmias to suggest afib .  Is a vegetarian - eats no meat   Nov. 7, 2022 Sabrina Baker is seen today for follow up of her PAF, HLD, HTN Was in the hospital for facial cellulitis, acute diastolic CHF  Required oxygen for a while Now is saturating well without supplemental O2 now CXR last Friday looked ok    Nov. 6, 2023 Sabrina Baker is seen today for follow up of her PAF, HLD, HTN Hx of diastolic CHF   Breathing has been ok   Nov. 4, 2024  Sabrina Baker is seen today for her PAF,  HLD, HTN Hx of diastolic CHF    Past Medical History:  Diagnosis Date   Acute diastolic congestive heart failure (HCC)    Breast cancer (HCC)    right breast   Carotid stenosis    Carotid US 1/22: Bilateral ICA 1-39; follow-up as needed   Chronic kidney disease    stage 3   Diabetes mellitus without complication (HCC)    Gout    Hypercholesteremia    Hypertension    Neuromuscular disorder (HCC)    neuropathy   Osteopenia    Retinal detachment    Spinal stenosis    TIA (transient ischemic attack)    Vitamin B12 deficiency     Past Surgical History:  Procedure Laterality Date   25 GAUGE PARS PLANA VITRECTOMY WITH 20 GAUGE MVR PORT Right 03/16/2015   Procedure: 25 GAUGE PARS PLANA VITRECTOMY WITH 23 GAUGE MVR PORT;  Surgeon: Sherrie George, MD;  Location: [facility] OR;  Service: Ophthalmology;  Laterality: Right;   ANKLE SURGERY     BREAST BIOPSY Right 06/28/2022   [place] RT BREAST BX W LOC DEV 1ST LESION IMG BX SPEC US GUIDE 06/28/2022 [facility]   BREAST BIOPSY  08/02/2022   MM RT  RADIOACTIVE SEED LOC MAMMO GUIDE 08/02/2022 [facility]   BREAST EXCISIONAL BIOPSY     BREAST LUMPECTOMY     BREAST LUMPECTOMY WITH RADIOACTIVE SEED LOCALIZATION Right 01/24/2016   Procedure: RIGHT BREAST LUMPECTOMY WITH RADIOACTIVE SEED LOCALIZATION;  Surgeon: Abigail Miyamoto, MD;  Location: [facility];  Service: General;  Laterality: Right;   BREAST LUMPECTOMY WITH RADIOACTIVE SEED LOCALIZATION Right 08/03/2022   Procedure: RIGHT BREAST LUMPECTOMY WITH RADIOACTIVE SEED LOCALIZATION;  Surgeon: Abigail Miyamoto, MD;  Location: [facility];  Service: General;  Laterality: Right;  LMA   EYE SURGERY     detached retna X2   HEEL SPUR SURGERY  2012   INCISION AND DRAINAGE ABSCESS Right 11/21/2019   Procedure: INCISION AND DRAINAGE FOREHEAD & RIGHT EAR ABSCESS;  Surgeon: Osborn Coho, MD;  Location: [facility] OR;  Service: ENT;  Laterality: Right;   INJECTION OF SILICONE  OIL Right 03/16/2015   Procedure: Extraction OF SILICONE OIL;  Surgeon: Sherrie George, MD;  Location: [facility] OR;  Service: Ophthalmology;  Laterality: Right;   ORIF ANKLE FRACTURE Left 06/17/2014   Procedure: OPEN REDUCTION INTERNAL FIXATION (ORIF) LEFT BIMALLEOLAR ANKLE FRACTURE;  Surgeon: Cheral Almas, MD;  Location: [facility] OR;  Service: Orthopedics;  Laterality: Left;   PHOTOCOAGULATION WITH LASER Right 03/16/2015   Procedure: PHOTOCOAGULATION WITH LASER;  Surgeon: Sherrie George, MD;  Location: [facility] OR;  Service: Ophthalmology;  Laterality: Right;   SHOULDER ARTHROSCOPY Right    plates and screws   VITRECTOMY Right 03/16/2015   WRIST ARTHROPLASTY Left    plates and screws and bone graft    Current Medications: Current Meds  Medication Sig   acetaminophen (TYLENOL) 650 MG CR tablet Take 1,300 mg by mouth every 8 (eight) hours as needed for pain.   amLODipine (NORVASC) 2.5 MG tablet Take 2.5 mg by mouth daily.   anastrozole (ARIMIDEX) 1 MG tablet Take 1 tablet (1 mg total) by mouth daily.   atorvastatin (LIPITOR) 10 MG tablet Take 10 mg by mouth daily.   bevacizumab (AVASTIN) 400 MG/16ML SOLN Inject into the vein.   bevacizumab (AVASTIN) 400 MG/16ML SOLN Apply to eye.   Blood Glucose Monitoring Suppl (GLUCOCOM BLOOD GLUCOSE MONITOR) DEVI Use to test blood sugar 3 times a day (E11.65)   brimonidine (ALPHAGAN P) 0.1 % SOLN    brimonidine (ALPHAGAN) 0.2 % ophthalmic solution Place 1 drop into the left eye 2 (two) times daily.   calcitRIOL (ROCALTROL) 0.25 MCG capsule Take 0.25 mcg by mouth 3 (three) times a week. Mon, Wed, Friday   calcium-vitamin D (OSCAL WITH D) 500-200 MG-UNIT TABS tablet Take 1 tablet by mouth daily.    carvedilol (COREG) 25 MG tablet Take 25 mg by mouth 2 (two) times daily with a meal.   ciprofloxacin (CILOXAN) 0.3 % ophthalmic solution SMARTSIG:In Eye(s)   colchicine 0.6 MG tablet 1 tablet Orally once a day as needed for acute gout flare for 90 days   cyanocobalamin  100 MCG tablet Take 100 mcg by mouth daily.   diclofenac Sodium (VOLTAREN) 1 % GEL Apply topically 4 (four) times daily. As needed   DULoxetine (CYMBALTA) 60 MG capsule Take 60 mg by mouth daily.   ELIQUIS 2.5 MG TABS tablet TAKE 1 TABLET BY MOUTH TWICE A DAY   febuxostat (ULORIC) 40 MG tablet Take 40 mg by mouth daily. One tab a day   Ferrous Sulfate (IRON) 325 (65 FE) MG TABS Take 1 tablet by mouth 2 (two) times daily.  furosemide (LASIX) 20 MG tablet Take 20 mg by mouth daily.   gabapentin (NEURONTIN) 400 MG capsule Take 1 capsule (400 mg total) by mouth 3 (three) times daily. Can take 2x/day- can increase if need be, to a max of 400 mg 3x/day- for nerve pain   HYDROcodone-acetaminophen (NORCO) 7.5-325 MG tablet Take 1 tablet by mouth every 6 (six) hours as needed for moderate pain (pain score 4-6). Fill 12/24-   HYDROcodone-acetaminophen (NORCO) 7.5-325 MG tablet Take 1 tablet by mouth every 8 (eight) hours as needed for moderate pain (pain score 4-6).   Insulin Pen Needle 32G X 4 MM MISC USE TO INJECT INSULIN ONCE A DAY   ketoconazole (NIZORAL) 2 % shampoo Apply 1 application topically 2 (two) times a week.    Lancets 30G MISC Use to test blood sugars 3 times a day (Dx: E11.65)   mupirocin ointment (BACTROBAN) 2 % 1 application to affected area Externally Dx: R21 twice a day as needed   nateglinide (STARLIX) 60 MG tablet Take by mouth.   ONETOUCH VERIO test strip USE AS DIRECTED. TESTING FREQUENCY  3 X/DAILY (DX E11.65)   pantoprazole (PROTONIX) 40 MG tablet 1 tablet Orally twice a day Dx: K21.9 for 90 days   TOUJEO SOLOSTAR 300 UNIT/ML Solostar Pen Inject into the skin.   triamcinolone cream (KENALOG) 0.1 % Apply 1 application topically 2 (two) times daily as needed (For rash).    trimethoprim-polymyxin b (POLYTRIM) ophthalmic solution Use 4 times in left eye the day of injection, 4 drop the following day.   TRULICITY 3 MG/0.5ML SOAJ Inject into the skin once a week.   VELTASSA 8.4 g  packet PLEASE SEE ATTACHED FOR DETAILED DIRECTIONS   Current Facility-Administered Medications for the 04/16/23 encounter (Office Visit) with Arlin Savona, Deloris Ping, MD  Medication   betamethasone acetate-betamethasone sodium phosphate (CELESTONE) injection 6 mg   iohexol (OMNIPAQUE) 180 MG/ML injection 3 mL   lidocaine (XYLOCAINE) 1 % (with pres) injection 5 mL     Allergies:   Ramipril, Cortisone, Niacin, Niacin and related, and Valsartan   Social History   Socioeconomic History   Marital status: Divorced    Spouse name: Not on file   Number of children: Not on file   Years of education: Not on file   Highest education level: Not on file  Occupational History   Not on file  Tobacco Use   Smoking status: Never    Passive exposure: Past   Smokeless tobacco: Never  Vaping Use   Vaping status: Never Used  Substance and Sexual Activity   Alcohol use: No   Drug use: No   Sexual activity: Never    Birth control/protection: Post-menopausal  Other Topics Concern   Not on file  Social History Narrative   Not on file   Social Determinants of Health   Financial Resource Strain: Not on file  Food Insecurity: Not on file  Transportation Needs: Not on file  Physical Activity: Not on file  Stress: Not on file  Social Connections: Unknown (09/07/2022)   Received from [facility], [facility]   Social Network    Social Network: Not on file     Family History: The patient's family history is not on file.  ROS:   Please see the history of present illness.     All other systems reviewed and are negative.  EKGs/Labs/Other Studies Reviewed:    The following studies were reviewed today:     Recent Labs: 07/05/2022: Hemoglobin 13.3;  Platelets 276 07/31/2022: BUN 38; Creatinine, Ser 1.49; Potassium 4.7; Sodium 136  Recent Lipid Panel    Component Value Date/Time   CHOL 134 04/18/2012 0540   TRIG 76 09/22/2018 0254   HDL 58 04/18/2012 0540   CHOLHDL 2.3 04/18/2012 0540   VLDL  14 04/18/2012 0540   LDLCALC 62 04/18/2012 0540    Physical Exam:     Physical Exam: Blood pressure (!) 118/52, pulse 77, height 5\' 3"  (1.6 m), weight 146 lb 9.6 oz (66.5 kg), SpO2 91%.      GEN:  Well nourished, well developed in no acute distress HEENT: Normal NECK: No JVD; No carotid bruits LYMPHATICS: No lymphadenopathy CARDIAC: RRR  soft systolic murmur  RESPIRATORY:  Clear to auscultation without rales, wheezing or rhonchi  ABDOMEN: Soft, non-tender, non-distended MUSCULOSKELETAL:  No edema; No deformity  SKIN: Warm and dry NEUROLOGIC:  Alert and oriented x 3    ECG:  EKG Interpretation Date/Time:  Monday April 16 2023 09:40:22 EST Ventricular Rate:  73 PR Interval:  156 QRS Duration:  94 QT Interval:  380 QTC Calculation: 418 R Axis:   -54  Text Interpretation: Sinus rhythm with Premature atrial complexes Left axis deviation Anterior infarct , age undetermined When compared with ECG of 18-Mar-2021 14:33, Premature atrial complexes are now Present Anterior infarct is now Present Inverted T waves have replaced nonspecific T wave abnormality in Inferior leads Confirmed by Kristeen Miss ([id number]) on 04/16/2023 9:54:43 AM    EKG Interpretation Date/Time:  Monday April 16 2023 09:40:22 EST Ventricular Rate:  73 PR Interval:  156 QRS Duration:  94 QT Interval:  380 QTC Calculation: 418 R Axis:   -54  Text Interpretation: Sinus rhythm with Premature atrial complexes Left axis deviation Anterior infarct , age undetermined When compared with ECG of 18-Mar-2021 14:33, Premature atrial complexes are now Present Anterior infarct is now Present Inverted T waves have replaced nonspecific T wave abnormality in Inferior leads Confirmed by Kristeen Miss ([id number]) on 04/16/2023 9:54:43 AM      ASSESSMENT:    1. Paroxysmal atrial fibrillation (HCC)   2. Nonrheumatic aortic valve stenosis   3. Chronic diastolic heart failure (HCC)       PLAN:      PAF:   CHADS2VASC  is 82 ( female, age, TIA, HTN, DM ).    Cont eliquis 2.5 BID  9 creatinine is 1.49, age 46      2. HTN:       Bp is well controlled.   3.  Hyperlipidema:     stable     4.  Aortic stenosis:   Had mild AS by echo in 2022.  Repeat echo     Medication Adjustments/Labs and Tests Ordered: Current medicines are reviewed at length with the patient today.  Concerns regarding medicines are outlined above.  Orders Placed This Encounter  Procedures   EKG 12-Lead   ECHOCARDIOGRAM COMPLETE    No orders of the defined types were placed in this encounter.    Patient Instructions  Testing/Procedures: ECHO Your physician has requested that you have an echocardiogram. Echocardiography is a painless test that uses sound waves to create images of your heart. It provides your doctor with information about the size and shape of your heart and how well your heart's chambers and valves are working. This procedure takes approximately one hour. There are no restrictions for this procedure. Please do NOT wear cologne, perfume, aftershave, or lotions (deodorant is allowed). Please arrive 15 minutes  prior to your appointment time.  Please note: We ask at that you not bring children with you during ultrasound (echo/ vascular) testing. Due to room size and safety concerns, children are not allowed in the ultrasound rooms during exams. Our front office staff cannot provide observation of children in our lobby area while testing is being conducted. An adult accompanying a patient to their appointment will only be allowed in the ultrasound room at the discretion of the ultrasound technician under special circumstances. We apologize for any inconvenience.  Follow-Up: At [facility], you and your health needs are our priority.  As part of our continuing mission to provide you with exceptional heart care, we have created designated Provider Care Teams.  These Care Teams include your primary Cardiologist  (physician) and Advanced Practice Providers (APPs -  Physician Assistants and Nurse Practitioners) who all work together to provide you with the care you need, when you need it.  We recommend signing up for the patient portal called "MyChart".  Sign up information is provided on this After Visit Summary.  MyChart is used to connect with patients for Virtual Visits (Telemedicine).  Patients are able to view lab/test results, encounter notes, upcoming appointments, etc.  Non-urgent messages can be sent to your provider as well.   To learn more about what you can do with MyChart, go to [email or web address].    Your next appointment:   1 year(s)  Provider:   Kristeen Miss, MD        Signed, Kristeen Miss, MD  04/16/2023 2:18 PM    [facility]

## 2023-04-16 ENCOUNTER — Encounter: Payer: Self-pay | Admitting: Cardiovascular Disease

## 2023-04-16 ENCOUNTER — Ambulatory Visit: Payer: 59 | Attending: Cardiovascular Disease | Admitting: Cardiovascular Disease

## 2023-04-16 VITALS — BP 118/52 | HR 77 | Ht 63.0 in | Wt 146.6 lb

## 2023-04-16 DIAGNOSIS — I350 Nonrheumatic aortic (valve) stenosis: Secondary | ICD-10-CM

## 2023-04-16 DIAGNOSIS — I5032 Chronic diastolic (congestive) heart failure: Secondary | ICD-10-CM

## 2023-04-16 DIAGNOSIS — I480 Paroxysmal atrial fibrillation: Secondary | ICD-10-CM | POA: Diagnosis not present

## 2023-04-16 NOTE — Patient Instructions (Signed)
Testing/Procedures: ECHO Your physician has requested that you have an echocardiogram. Echocardiography is a painless test that uses sound waves to create images of your heart. It provides your doctor with information about the size and shape of your heart and how well your heart's chambers and valves are working. This procedure takes approximately one hour. There are no restrictions for this procedure. Please do NOT wear cologne, perfume, aftershave, or lotions (deodorant is allowed). Please arrive 15 minutes prior to your appointment time.  Please note: We ask at that you not bring children with you during ultrasound (echo/ vascular) testing. Due to room size and safety concerns, children are not allowed in the ultrasound rooms during exams. Our front office staff cannot provide observation of children in our lobby area while testing is being conducted. An adult accompanying a patient to their appointment will only be allowed in the ultrasound room at the discretion of the ultrasound technician under special circumstances. We apologize for any inconvenience.  Follow-Up: At [facility], you and your health needs are our priority.  As part of our continuing mission to provide you with exceptional heart care, we have created designated Provider Care Teams.  These Care Teams include your primary Cardiologist (physician) and Advanced Practice Providers (APPs -  Physician Assistants and Nurse Practitioners) who all work together to provide you with the care you need, when you need it.  We recommend signing up for the patient portal called "MyChart".  Sign up information is provided on this After Visit Summary.  MyChart is used to connect with patients for Virtual Visits (Telemedicine).  Patients are able to view lab/test results, encounter notes, upcoming appointments, etc.  Non-urgent messages can be sent to your provider as well.   To learn more about what you can do with MyChart, go to  [email or web address].    Your next appointment:   1 year(s)  Provider:   Kristeen Miss, MD

## 2023-04-18 ENCOUNTER — Other Ambulatory Visit: Payer: Self-pay | Admitting: Cardiovascular Disease

## 2023-04-18 DIAGNOSIS — I5031 Acute diastolic (congestive) heart failure: Secondary | ICD-10-CM

## 2023-04-18 DIAGNOSIS — I480 Paroxysmal atrial fibrillation: Secondary | ICD-10-CM

## 2023-04-18 NOTE — Telephone Encounter (Signed)
Eliquis 2.5mg  refill request received. Patient is 82 years old, weight-66.5kg, Crea-1.67 on 03/13/23 via Care Everywhere from [facility], Diagnosis-Afib, and last seen by Dr. Elease Hashimoto on 04/16/23. Dose is appropriate based on dosing criteria. Will send in refill to requested pharmacy.

## 2023-04-25 ENCOUNTER — Encounter (HOSPITAL_COMMUNITY): Admission: EM | Disposition: E | Payer: Self-pay | Source: Home / Self Care | Attending: Internal Medicine

## 2023-04-25 ENCOUNTER — Encounter (INDEPENDENT_AMBULATORY_CARE_PROVIDER_SITE_OTHER): Payer: 59 | Admitting: Ophthalmology

## 2023-04-25 ENCOUNTER — Other Ambulatory Visit: Payer: Self-pay

## 2023-04-25 ENCOUNTER — Emergency Department (HOSPITAL_COMMUNITY): Payer: 59

## 2023-04-25 ENCOUNTER — Inpatient Hospital Stay (HOSPITAL_COMMUNITY)
Admission: EM | Admit: 2023-04-25 | Discharge: 2023-05-13 | DRG: 492 | Disposition: E | Payer: 59 | Attending: Internal Medicine | Admitting: Internal Medicine

## 2023-04-25 ENCOUNTER — Inpatient Hospital Stay (HOSPITAL_COMMUNITY): Payer: 59 | Admitting: Anesthesiology

## 2023-04-25 ENCOUNTER — Encounter (HOSPITAL_COMMUNITY): Payer: Self-pay

## 2023-04-25 ENCOUNTER — Inpatient Hospital Stay (HOSPITAL_COMMUNITY): Payer: 59

## 2023-04-25 DIAGNOSIS — S82242A Displaced spiral fracture of shaft of left tibia, initial encounter for closed fracture: Secondary | ICD-10-CM | POA: Diagnosis not present

## 2023-04-25 DIAGNOSIS — Z853 Personal history of malignant neoplasm of breast: Secondary | ICD-10-CM | POA: Diagnosis not present

## 2023-04-25 DIAGNOSIS — J849 Interstitial pulmonary disease, unspecified: Secondary | ICD-10-CM | POA: Diagnosis present

## 2023-04-25 DIAGNOSIS — R6889 Other general symptoms and signs: Secondary | ICD-10-CM | POA: Diagnosis not present

## 2023-04-25 DIAGNOSIS — E875 Hyperkalemia: Secondary | ICD-10-CM | POA: Diagnosis present

## 2023-04-25 DIAGNOSIS — Y92099 Unspecified place in other non-institutional residence as the place of occurrence of the external cause: Secondary | ICD-10-CM | POA: Diagnosis not present

## 2023-04-25 DIAGNOSIS — I130 Hypertensive heart and chronic kidney disease with heart failure and stage 1 through stage 4 chronic kidney disease, or unspecified chronic kidney disease: Secondary | ICD-10-CM | POA: Diagnosis not present

## 2023-04-25 DIAGNOSIS — G510 Bell's palsy: Secondary | ICD-10-CM | POA: Diagnosis present

## 2023-04-25 DIAGNOSIS — I5032 Chronic diastolic (congestive) heart failure: Secondary | ICD-10-CM | POA: Diagnosis present

## 2023-04-25 DIAGNOSIS — Z79811 Long term (current) use of aromatase inhibitors: Secondary | ICD-10-CM

## 2023-04-25 DIAGNOSIS — R6510 Systemic inflammatory response syndrome (SIRS) of non-infectious origin without acute organ dysfunction: Secondary | ICD-10-CM | POA: Diagnosis present

## 2023-04-25 DIAGNOSIS — E1122 Type 2 diabetes mellitus with diabetic chronic kidney disease: Secondary | ICD-10-CM | POA: Diagnosis present

## 2023-04-25 DIAGNOSIS — S82202A Unspecified fracture of shaft of left tibia, initial encounter for closed fracture: Secondary | ICD-10-CM | POA: Diagnosis not present

## 2023-04-25 DIAGNOSIS — I6523 Occlusion and stenosis of bilateral carotid arteries: Secondary | ICD-10-CM | POA: Diagnosis present

## 2023-04-25 DIAGNOSIS — M25511 Pain in right shoulder: Secondary | ICD-10-CM | POA: Diagnosis present

## 2023-04-25 DIAGNOSIS — I6782 Cerebral ischemia: Secondary | ICD-10-CM | POA: Diagnosis not present

## 2023-04-25 DIAGNOSIS — M4800 Spinal stenosis, site unspecified: Secondary | ICD-10-CM | POA: Diagnosis present

## 2023-04-25 DIAGNOSIS — I4891 Unspecified atrial fibrillation: Secondary | ICD-10-CM | POA: Diagnosis not present

## 2023-04-25 DIAGNOSIS — D72829 Elevated white blood cell count, unspecified: Secondary | ICD-10-CM | POA: Diagnosis not present

## 2023-04-25 DIAGNOSIS — J9601 Acute respiratory failure with hypoxia: Secondary | ICD-10-CM | POA: Diagnosis present

## 2023-04-25 DIAGNOSIS — R944 Abnormal results of kidney function studies: Secondary | ICD-10-CM | POA: Diagnosis present

## 2023-04-25 DIAGNOSIS — I517 Cardiomegaly: Secondary | ICD-10-CM | POA: Diagnosis not present

## 2023-04-25 DIAGNOSIS — S82235A Nondisplaced oblique fracture of shaft of left tibia, initial encounter for closed fracture: Secondary | ICD-10-CM | POA: Diagnosis not present

## 2023-04-25 DIAGNOSIS — Z602 Problems related to living alone: Secondary | ICD-10-CM | POA: Diagnosis present

## 2023-04-25 DIAGNOSIS — S72342A Displaced spiral fracture of shaft of left femur, initial encounter for closed fracture: Secondary | ICD-10-CM | POA: Diagnosis not present

## 2023-04-25 DIAGNOSIS — E871 Hypo-osmolality and hyponatremia: Secondary | ICD-10-CM | POA: Diagnosis not present

## 2023-04-25 DIAGNOSIS — Z96632 Presence of left artificial wrist joint: Secondary | ICD-10-CM | POA: Diagnosis present

## 2023-04-25 DIAGNOSIS — Z515 Encounter for palliative care: Secondary | ICD-10-CM | POA: Diagnosis not present

## 2023-04-25 DIAGNOSIS — Z883 Allergy status to other anti-infective agents status: Secondary | ICD-10-CM

## 2023-04-25 DIAGNOSIS — Y9301 Activity, walking, marching and hiking: Secondary | ICD-10-CM | POA: Diagnosis present

## 2023-04-25 DIAGNOSIS — G35 Multiple sclerosis: Secondary | ICD-10-CM | POA: Diagnosis not present

## 2023-04-25 DIAGNOSIS — S82452A Displaced comminuted fracture of shaft of left fibula, initial encounter for closed fracture: Secondary | ICD-10-CM | POA: Diagnosis not present

## 2023-04-25 DIAGNOSIS — M8580 Other specified disorders of bone density and structure, unspecified site: Secondary | ICD-10-CM | POA: Diagnosis present

## 2023-04-25 DIAGNOSIS — W19XXXA Unspecified fall, initial encounter: Secondary | ICD-10-CM

## 2023-04-25 DIAGNOSIS — I700 Atherosclerosis of aorta: Secondary | ICD-10-CM | POA: Diagnosis not present

## 2023-04-25 DIAGNOSIS — Z794 Long term (current) use of insulin: Secondary | ICD-10-CM

## 2023-04-25 DIAGNOSIS — I10 Essential (primary) hypertension: Secondary | ICD-10-CM | POA: Diagnosis not present

## 2023-04-25 DIAGNOSIS — R0902 Hypoxemia: Secondary | ICD-10-CM

## 2023-04-25 DIAGNOSIS — Z8673 Personal history of transient ischemic attack (TIA), and cerebral infarction without residual deficits: Secondary | ICD-10-CM

## 2023-04-25 DIAGNOSIS — E1165 Type 2 diabetes mellitus with hyperglycemia: Secondary | ICD-10-CM | POA: Diagnosis not present

## 2023-04-25 DIAGNOSIS — Z9889 Other specified postprocedural states: Secondary | ICD-10-CM | POA: Diagnosis not present

## 2023-04-25 DIAGNOSIS — I469 Cardiac arrest, cause unspecified: Secondary | ICD-10-CM | POA: Diagnosis not present

## 2023-04-25 DIAGNOSIS — S82402A Unspecified fracture of shaft of left fibula, initial encounter for closed fracture: Secondary | ICD-10-CM | POA: Diagnosis not present

## 2023-04-25 DIAGNOSIS — I249 Acute ischemic heart disease, unspecified: Secondary | ICD-10-CM | POA: Diagnosis not present

## 2023-04-25 DIAGNOSIS — Z4682 Encounter for fitting and adjustment of non-vascular catheter: Secondary | ICD-10-CM | POA: Diagnosis not present

## 2023-04-25 DIAGNOSIS — N1832 Chronic kidney disease, stage 3b: Secondary | ICD-10-CM | POA: Diagnosis present

## 2023-04-25 DIAGNOSIS — R918 Other nonspecific abnormal finding of lung field: Secondary | ICD-10-CM | POA: Diagnosis not present

## 2023-04-25 DIAGNOSIS — S82422A Displaced transverse fracture of shaft of left fibula, initial encounter for closed fracture: Secondary | ICD-10-CM | POA: Diagnosis not present

## 2023-04-25 DIAGNOSIS — W1830XA Fall on same level, unspecified, initial encounter: Secondary | ICD-10-CM | POA: Diagnosis present

## 2023-04-25 DIAGNOSIS — M109 Gout, unspecified: Secondary | ICD-10-CM | POA: Diagnosis present

## 2023-04-25 DIAGNOSIS — K219 Gastro-esophageal reflux disease without esophagitis: Secondary | ICD-10-CM | POA: Diagnosis present

## 2023-04-25 DIAGNOSIS — Y92009 Unspecified place in unspecified non-institutional (private) residence as the place of occurrence of the external cause: Secondary | ICD-10-CM

## 2023-04-25 DIAGNOSIS — E7800 Pure hypercholesterolemia, unspecified: Secondary | ICD-10-CM | POA: Diagnosis present

## 2023-04-25 DIAGNOSIS — Z66 Do not resuscitate: Secondary | ICD-10-CM | POA: Diagnosis not present

## 2023-04-25 DIAGNOSIS — S0990XA Unspecified injury of head, initial encounter: Secondary | ICD-10-CM | POA: Diagnosis not present

## 2023-04-25 DIAGNOSIS — E119 Type 2 diabetes mellitus without complications: Secondary | ICD-10-CM | POA: Diagnosis not present

## 2023-04-25 DIAGNOSIS — I2510 Atherosclerotic heart disease of native coronary artery without angina pectoris: Secondary | ICD-10-CM | POA: Diagnosis not present

## 2023-04-25 DIAGNOSIS — N183 Chronic kidney disease, stage 3 unspecified: Secondary | ICD-10-CM

## 2023-04-25 DIAGNOSIS — S82222A Displaced transverse fracture of shaft of left tibia, initial encounter for closed fracture: Secondary | ICD-10-CM | POA: Diagnosis not present

## 2023-04-25 DIAGNOSIS — R140 Abdominal distension (gaseous): Secondary | ICD-10-CM | POA: Diagnosis not present

## 2023-04-25 DIAGNOSIS — S8012XA Contusion of left lower leg, initial encounter: Secondary | ICD-10-CM | POA: Diagnosis present

## 2023-04-25 DIAGNOSIS — Z79899 Other long term (current) drug therapy: Secondary | ICD-10-CM

## 2023-04-25 DIAGNOSIS — R609 Edema, unspecified: Secondary | ICD-10-CM | POA: Diagnosis not present

## 2023-04-25 DIAGNOSIS — S199XXA Unspecified injury of neck, initial encounter: Secondary | ICD-10-CM | POA: Diagnosis not present

## 2023-04-25 DIAGNOSIS — S82209A Unspecified fracture of shaft of unspecified tibia, initial encounter for closed fracture: Secondary | ICD-10-CM | POA: Diagnosis present

## 2023-04-25 DIAGNOSIS — Z888 Allergy status to other drugs, medicaments and biological substances status: Secondary | ICD-10-CM

## 2023-04-25 DIAGNOSIS — M25561 Pain in right knee: Secondary | ICD-10-CM | POA: Diagnosis present

## 2023-04-25 DIAGNOSIS — Z7901 Long term (current) use of anticoagulants: Secondary | ICD-10-CM

## 2023-04-25 DIAGNOSIS — Z7985 Long-term (current) use of injectable non-insulin antidiabetic drugs: Secondary | ICD-10-CM

## 2023-04-25 DIAGNOSIS — S82302A Unspecified fracture of lower end of left tibia, initial encounter for closed fracture: Secondary | ICD-10-CM | POA: Diagnosis not present

## 2023-04-25 DIAGNOSIS — Z87892 Personal history of anaphylaxis: Secondary | ICD-10-CM

## 2023-04-25 HISTORY — PX: TIBIA IM NAIL INSERTION: SHX2516

## 2023-04-25 LAB — RESPIRATORY PANEL BY PCR

## 2023-04-25 LAB — CBC WITH DIFFERENTIAL/PLATELET
Abs Immature Granulocytes: 0.08 10*3/uL — ABNORMAL HIGH (ref 0.00–0.07)
Basophils Absolute: 0 10*3/uL (ref 0.0–0.1)
Basophils Relative: 0 %
Eosinophils Absolute: 0.2 10*3/uL (ref 0.0–0.5)
Eosinophils Relative: 2 %
HCT: 42 % (ref 36.0–46.0)
Hemoglobin: 13.4 g/dL (ref 12.0–15.0)
Immature Granulocytes: 1 %
Lymphocytes Relative: 11 %
Lymphs Abs: 1.4 10*3/uL (ref 0.7–4.0)
MCH: 32.1 pg (ref 26.0–34.0)
MCHC: 31.9 g/dL (ref 30.0–36.0)
MCV: 100.5 fL — ABNORMAL HIGH (ref 80.0–100.0)
Monocytes Absolute: 0.9 10*3/uL (ref 0.1–1.0)
Monocytes Relative: 7 %
Neutro Abs: 9.9 10*3/uL — ABNORMAL HIGH (ref 1.7–7.7)
Neutrophils Relative %: 79 %
Platelets: 238 10*3/uL (ref 150–400)
RBC: 4.18 MIL/uL (ref 3.87–5.11)
RDW: 13.3 % (ref 11.5–15.5)
WBC: 12.6 10*3/uL — ABNORMAL HIGH (ref 4.0–10.5)
nRBC: 0 % (ref 0.0–0.2)

## 2023-04-25 LAB — URINALYSIS, ROUTINE W REFLEX MICROSCOPIC
Bacteria, UA: NONE SEEN
Bilirubin Urine: NEGATIVE
Glucose, UA: 50 mg/dL — AB
Hgb urine dipstick: NEGATIVE
Ketones, ur: NEGATIVE mg/dL
Nitrite: NEGATIVE
Protein, ur: NEGATIVE mg/dL
Specific Gravity, Urine: 1.008 (ref 1.005–1.030)
pH: 5 (ref 5.0–8.0)

## 2023-04-25 LAB — GLUCOSE, CAPILLARY
Glucose-Capillary: 181 mg/dL — ABNORMAL HIGH (ref 70–99)
Glucose-Capillary: 180 mg/dL — ABNORMAL HIGH (ref 70–99)
Glucose-Capillary: 138 mg/dL — ABNORMAL HIGH (ref 70–99)
Glucose-Capillary: 116 mg/dL — ABNORMAL HIGH (ref 70–99)
Glucose-Capillary: 263 mg/dL — ABNORMAL HIGH (ref 70–99)

## 2023-04-25 LAB — I-STAT CHEM 8, ED
BUN: 47 mg/dL — ABNORMAL HIGH (ref 8–23)
Calcium, Ion: 0.87 mmol/L — CL (ref 1.15–1.40)
Chloride: 105 mmol/L (ref 98–111)
Creatinine, Ser: 1.2 mg/dL — ABNORMAL HIGH (ref 0.44–1.00)
Glucose, Bld: 235 mg/dL — ABNORMAL HIGH (ref 70–99)
HCT: 42 % (ref 36.0–46.0)
Hemoglobin: 14.3 g/dL (ref 12.0–15.0)
Potassium: 5.2 mmol/L — ABNORMAL HIGH (ref 3.5–5.1)
Sodium: 132 mmol/L — ABNORMAL LOW (ref 135–145)
TCO2: 27 mmol/L (ref 22–32)

## 2023-04-25 LAB — HEMOGLOBIN A1C
Hgb A1c MFr Bld: 8.3 % — ABNORMAL HIGH (ref 4.8–5.6)
Mean Plasma Glucose: 191.51 mg/dL

## 2023-04-25 LAB — BASIC METABOLIC PANEL
Anion gap: 8 (ref 5–15)
BUN: 38 mg/dL — ABNORMAL HIGH (ref 8–23)
CO2: 26 mmol/L (ref 22–32)
Calcium: 9.6 mg/dL (ref 8.9–10.3)
Chloride: 100 mmol/L (ref 98–111)
Creatinine, Ser: 1.26 mg/dL — ABNORMAL HIGH (ref 0.44–1.00)
GFR, Estimated: 43 mL/min — ABNORMAL LOW (ref >60)
Glucose, Bld: 236 mg/dL — ABNORMAL HIGH (ref 70–99)
Potassium: 5.2 mmol/L — ABNORMAL HIGH (ref 3.5–5.1)
Sodium: 134 mmol/L — ABNORMAL LOW (ref 135–145)

## 2023-04-25 LAB — SURGICAL PCR SCREEN
MRSA, PCR: NEGATIVE
Staphylococcus aureus: POSITIVE — AB

## 2023-04-25 LAB — TROPONIN I (HIGH SENSITIVITY)
Troponin I (High Sensitivity): 21 ng/L — ABNORMAL HIGH (ref <18)
Troponin I (High Sensitivity): 17 ng/L (ref <18)

## 2023-04-25 LAB — HEPATIC FUNCTION PANEL
ALT: 14 U/L (ref 0–44)
AST: 18 U/L (ref 15–41)
Albumin: 3.3 g/dL — ABNORMAL LOW (ref 3.5–5.0)
Alkaline Phosphatase: 65 U/L (ref 38–126)
Bilirubin, Direct: 0.1 mg/dL (ref 0.0–0.2)
Indirect Bilirubin: 0.4 mg/dL (ref 0.3–0.9)
Total Bilirubin: 0.5 mg/dL (ref <1.2)
Total Protein: 6.5 g/dL (ref 6.5–8.1)

## 2023-04-25 LAB — PROCALCITONIN: Procalcitonin: <0.1 ng/mL

## 2023-04-25 LAB — PROTIME-INR
INR: 1 (ref 0.8–1.2)
Prothrombin Time: 13.7 s (ref 11.4–15.2)

## 2023-04-25 LAB — SAMPLE TO BLOOD BANK

## 2023-04-25 LAB — BRAIN NATRIURETIC PEPTIDE: B Natriuretic Peptide: 245 pg/mL — ABNORMAL HIGH (ref 0.0–100.0)

## 2023-04-25 LAB — I-STAT CG4 LACTIC ACID, ED: Lactic Acid, Venous: 1 mmol/L (ref 0.5–1.9)

## 2023-04-25 LAB — ETHANOL: Alcohol, Ethyl (B): <10 mg/dL (ref <10)

## 2023-04-25 LAB — SARS CORONAVIRUS 2 BY RT PCR: SARS Coronavirus 2 by RT PCR: NEGATIVE

## 2023-04-25 SURGERY — INSERTION, INTRAMEDULLARY ROD, TIBIA
Anesthesia: General | Laterality: Left

## 2023-04-25 MED ORDER — MUPIROCIN 2 % EX OINT
1.0000 | TOPICAL_OINTMENT | Freq: Two times a day (BID) | CUTANEOUS | Status: DC
  Administered 2023-04-25: 1 via NASAL
  Filled 2023-04-25: qty 22

## 2023-04-25 MED ORDER — CHLORHEXIDINE GLUCONATE 4 % EX SOLN: 60.0000 mL | Freq: Once | CUTANEOUS | Status: DC

## 2023-04-25 MED ORDER — ORAL CARE MOUTH RINSE: 15.0000 mL | Freq: Once | OROMUCOSAL | Status: AC

## 2023-04-25 MED ORDER — 0.9 % SODIUM CHLORIDE (POUR BTL) OPTIME
TOPICAL | Status: DC | PRN
  Administered 2023-04-25: 1000 mL

## 2023-04-25 MED ORDER — ANASTROZOLE 1 MG PO TABS
1.0000 mg | ORAL_TABLET | Freq: Every day | ORAL | Status: DC
  Filled 2023-04-25 (×2): qty 1

## 2023-04-25 MED ORDER — DOCUSATE SODIUM 100 MG PO CAPS
100.0000 mg | ORAL_CAPSULE | Freq: Two times a day (BID) | ORAL | Status: DC
  Administered 2023-04-25: 100 mg via ORAL
  Filled 2023-04-25: qty 1

## 2023-04-25 MED ORDER — CARVEDILOL 12.5 MG PO TABS
ORAL_TABLET | ORAL | Status: AC
  Administered 2023-04-25: 25 mg via ORAL
  Filled 2023-04-25: qty 2

## 2023-04-25 MED ORDER — FENTANYL CITRATE PF 50 MCG/ML IJ SOSY
50.0000 ug | PREFILLED_SYRINGE | Freq: Once | INTRAMUSCULAR | Status: AC
  Administered 2023-04-25: 50 ug via INTRAVENOUS
  Filled 2023-04-25: qty 1

## 2023-04-25 MED ORDER — CHLORHEXIDINE GLUCONATE CLOTH 2 % EX PADS: 6.0000 | MEDICATED_PAD | Freq: Every day | CUTANEOUS | Status: DC

## 2023-04-25 MED ORDER — ACETAMINOPHEN 325 MG PO TABS: 325.0000 mg | ORAL_TABLET | Freq: Four times a day (QID) | ORAL | Status: DC | PRN

## 2023-04-25 MED ORDER — DULOXETINE HCL 60 MG PO CPEP: 60.0000 mg | ORAL_CAPSULE | Freq: Every day | ORAL | Status: DC

## 2023-04-25 MED ORDER — DEXTROSE 5 % IV SOLN
500.0000 mg | Freq: Every day | INTRAVENOUS | Status: DC
  Administered 2023-04-25: 500 mg via INTRAVENOUS
  Filled 2023-04-25: qty 5

## 2023-04-25 MED ORDER — FENTANYL CITRATE (PF) 250 MCG/5ML IJ SOLN
INTRAMUSCULAR | Status: DC | PRN
  Administered 2023-04-25: 50 ug via INTRAVENOUS

## 2023-04-25 MED ORDER — PHENYLEPHRINE HCL-NACL 20-0.9 MG/250ML-% IV SOLN
INTRAVENOUS | Status: DC | PRN
  Administered 2023-04-25: 80 ug/min via INTRAVENOUS

## 2023-04-25 MED ORDER — METHOCARBAMOL 1000 MG/10ML IJ SOLN: 500.0000 mg | Freq: Four times a day (QID) | INTRAMUSCULAR | Status: DC | PRN

## 2023-04-25 MED ORDER — METOCLOPRAMIDE HCL 5 MG/ML IJ SOLN: 5.0000 mg | Freq: Three times a day (TID) | INTRAMUSCULAR | Status: DC | PRN

## 2023-04-25 MED ORDER — MORPHINE SULFATE (PF) 2 MG/ML IV SOLN: 0.5000 mg | INTRAVENOUS | Status: DC | PRN

## 2023-04-25 MED ORDER — ACETAMINOPHEN 325 MG PO TABS: 650.0000 mg | ORAL_TABLET | Freq: Four times a day (QID) | ORAL | Status: DC | PRN

## 2023-04-25 MED ORDER — CARVEDILOL 12.5 MG PO TABS: 25.0000 mg | ORAL_TABLET | Freq: Once | ORAL | Status: AC

## 2023-04-25 MED ORDER — GABAPENTIN 400 MG PO CAPS
400.0000 mg | ORAL_CAPSULE | Freq: Three times a day (TID) | ORAL | Status: DC
  Administered 2023-04-25: 400 mg via ORAL
  Filled 2023-04-25: qty 1

## 2023-04-25 MED ORDER — METOCLOPRAMIDE HCL 5 MG PO TABS
5.0000 mg | ORAL_TABLET | Freq: Three times a day (TID) | ORAL | Status: DC | PRN
Start: 2023-04-25 — End: 2023-04-26

## 2023-04-25 MED ORDER — ENOXAPARIN SODIUM 40 MG/0.4ML IJ SOSY: 40.0000 mg | PREFILLED_SYRINGE | INTRAMUSCULAR | Status: DC

## 2023-04-25 MED ORDER — BRIMONIDINE TARTRATE 0.15 % OP SOLN
1.0000 [drp] | Freq: Two times a day (BID) | OPHTHALMIC | Status: DC
  Administered 2023-04-25: 1 [drp] via OPHTHALMIC
  Filled 2023-04-25: qty 5

## 2023-04-25 MED ORDER — SENNOSIDES-DOCUSATE SODIUM 8.6-50 MG PO TABS
1.0000 | ORAL_TABLET | Freq: Two times a day (BID) | ORAL | Status: DC
  Administered 2023-04-25: 1 via ORAL
  Filled 2023-04-25: qty 1

## 2023-04-25 MED ORDER — COLCHICINE 0.6 MG PO TABS
0.6000 mg | ORAL_TABLET | Freq: Once | ORAL | Status: DC
  Filled 2023-04-25: qty 1

## 2023-04-25 MED ORDER — PHENYLEPHRINE 80 MCG/ML (10ML) SYRINGE FOR IV PUSH (FOR BLOOD PRESSURE SUPPORT)
PREFILLED_SYRINGE | INTRAVENOUS | Status: DC | PRN
  Administered 2023-04-25: 80 ug via INTRAVENOUS

## 2023-04-25 MED ORDER — ALBUMIN HUMAN 5 % IV SOLN: INTRAVENOUS | Status: DC | PRN

## 2023-04-25 MED ORDER — GUAIFENESIN ER 600 MG PO TB12
600.0000 mg | ORAL_TABLET | Freq: Two times a day (BID) | ORAL | Status: DC
  Administered 2023-04-25: 600 mg via ORAL
  Filled 2023-04-25: qty 1

## 2023-04-25 MED ORDER — CEFAZOLIN SODIUM-DEXTROSE 2-4 GM/100ML-% IV SOLN: 2.0000 g | Freq: Three times a day (TID) | INTRAVENOUS | Status: DC

## 2023-04-25 MED ORDER — METHOCARBAMOL 500 MG PO TABS
500.0000 mg | ORAL_TABLET | Freq: Four times a day (QID) | ORAL | Status: DC | PRN
  Administered 2023-04-25: 500 mg via ORAL
  Filled 2023-04-25: qty 1

## 2023-04-25 MED ORDER — PROPOFOL 10 MG/ML IV BOLUS
INTRAVENOUS | Status: DC | PRN
  Administered 2023-04-25: 120 mg via INTRAVENOUS

## 2023-04-25 MED ORDER — CARVEDILOL PHOSPHATE ER 10 MG PO CP24: 25.0000 mg | ORAL_CAPSULE | Freq: Once | ORAL | Status: DC

## 2023-04-25 MED ORDER — VANCOMYCIN HCL 1000 MG IV SOLR
INTRAVENOUS | Status: DC | PRN
  Administered 2023-04-25: 1000 mg

## 2023-04-25 MED ORDER — INSULIN ASPART 100 UNIT/ML IJ SOLN: 0.0000 [IU] | Freq: Three times a day (TID) | INTRAMUSCULAR | Status: DC

## 2023-04-25 MED ORDER — AMLODIPINE BESYLATE 5 MG PO TABS: 2.5000 mg | ORAL_TABLET | Freq: Every day | ORAL | Status: DC

## 2023-04-25 MED ORDER — OXYCODONE HCL 5 MG PO TABS: 5.0000 mg | ORAL_TABLET | Freq: Once | ORAL | Status: DC | PRN

## 2023-04-25 MED ORDER — POVIDONE-IODINE 10 % EX SWAB
2.0000 | Freq: Once | CUTANEOUS | Status: AC
  Administered 2023-04-25: 2 via TOPICAL

## 2023-04-25 MED ORDER — ONDANSETRON HCL 4 MG PO TABS: 4.0000 mg | ORAL_TABLET | Freq: Four times a day (QID) | ORAL | Status: DC | PRN

## 2023-04-25 MED ORDER — PANTOPRAZOLE SODIUM 40 MG PO TBEC
40.0000 mg | DELAYED_RELEASE_TABLET | Freq: Two times a day (BID) | ORAL | Status: DC
  Administered 2023-04-25: 40 mg via ORAL
  Filled 2023-04-25: qty 1

## 2023-04-25 MED ORDER — CARVEDILOL 25 MG PO TABS
25.0000 mg | ORAL_TABLET | Freq: Two times a day (BID) | ORAL | Status: DC
  Filled 2023-04-25: qty 1

## 2023-04-25 MED ORDER — ONDANSETRON HCL 4 MG/2ML IJ SOLN: 4.0000 mg | Freq: Once | INTRAMUSCULAR | Status: DC | PRN

## 2023-04-25 MED ORDER — CHLORHEXIDINE GLUCONATE 0.12 % MT SOLN: 15.0000 mL | Freq: Once | OROMUCOSAL | Status: AC

## 2023-04-25 MED ORDER — HYDROCODONE-ACETAMINOPHEN 5-325 MG PO TABS
1.0000 | ORAL_TABLET | Freq: Four times a day (QID) | ORAL | Status: DC | PRN
Start: 2023-04-25 — End: 2023-04-25

## 2023-04-25 MED ORDER — APIXABAN 2.5 MG PO TABS: 2.5000 mg | ORAL_TABLET | Freq: Two times a day (BID) | ORAL | Status: DC

## 2023-04-25 MED ORDER — ONDANSETRON HCL 4 MG/2ML IJ SOLN
INTRAMUSCULAR | Status: DC | PRN
  Administered 2023-04-25: 4 mg via INTRAVENOUS

## 2023-04-25 MED ORDER — IOPAMIDOL (ISOVUE-370) INJECTION 76%
60.0000 mL | Freq: Once | INTRAVENOUS | Status: AC | PRN
  Administered 2023-04-25: 60 mL via INTRAVENOUS

## 2023-04-25 MED ORDER — HYDROCODONE-ACETAMINOPHEN 7.5-325 MG PO TABS
1.0000 | ORAL_TABLET | ORAL | Status: DC | PRN
  Administered 2023-04-25: 2 via ORAL
  Filled 2023-04-25: qty 2

## 2023-04-25 MED ORDER — CHLORHEXIDINE GLUCONATE 0.12 % MT SOLN
OROMUCOSAL | Status: AC
  Administered 2023-04-25: 15 mL via OROMUCOSAL
  Filled 2023-04-25: qty 15

## 2023-04-25 MED ORDER — POLYETHYLENE GLYCOL 3350 17 G PO PACK: 17.0000 g | PACK | Freq: Every day | ORAL | Status: DC | PRN

## 2023-04-25 MED ORDER — ACETAMINOPHEN 500 MG PO TABS
ORAL_TABLET | ORAL | Status: AC
  Filled 2023-04-25: qty 2

## 2023-04-25 MED ORDER — FENTANYL CITRATE (PF) 100 MCG/2ML IJ SOLN: 25.0000 ug | INTRAMUSCULAR | Status: DC | PRN

## 2023-04-25 MED ORDER — CALCITRIOL 0.25 MCG PO CAPS
0.2500 ug | ORAL_CAPSULE | ORAL | Status: DC
  Filled 2023-04-25: qty 1

## 2023-04-25 MED ORDER — OXYCODONE HCL 5 MG/5ML PO SOLN: 5.0000 mg | Freq: Once | ORAL | Status: DC | PRN

## 2023-04-25 MED ORDER — CARVEDILOL 25 MG PO TABS
25.0000 mg | ORAL_TABLET | Freq: Two times a day (BID) | ORAL | Status: DC
  Administered 2023-04-25: 25 mg via ORAL
  Filled 2023-04-25: qty 1

## 2023-04-25 MED ORDER — INSULIN GLARGINE-YFGN 100 UNIT/ML ~~LOC~~ SOLN
10.0000 [IU] | Freq: Every day | SUBCUTANEOUS | Status: DC
  Administered 2023-04-25: 10 [IU] via SUBCUTANEOUS
  Filled 2023-04-25 (×2): qty 0.1

## 2023-04-25 MED ORDER — FENTANYL CITRATE (PF) 100 MCG/2ML IJ SOLN
INTRAMUSCULAR | Status: AC
  Filled 2023-04-25: qty 2

## 2023-04-25 MED ORDER — INSULIN ASPART 100 UNIT/ML IJ SOLN
0.0000 [IU] | INTRAMUSCULAR | Status: DC | PRN
Start: 2023-04-25 — End: 2023-04-25
  Administered 2023-04-25: 2 [IU] via SUBCUTANEOUS
  Filled 2023-04-25: qty 1

## 2023-04-25 MED ORDER — COLCHICINE 0.6 MG PO TABS: 0.6000 mg | ORAL_TABLET | Freq: Once | ORAL | Status: DC | PRN

## 2023-04-25 MED ORDER — SODIUM CHLORIDE 0.9 % IV SOLN
2.0000 g | INTRAVENOUS | Status: DC
  Administered 2023-04-25: 2 g via INTRAVENOUS
  Filled 2023-04-25: qty 20

## 2023-04-25 MED ORDER — VANCOMYCIN HCL 1000 MG IV SOLR
INTRAVENOUS | Status: AC
  Filled 2023-04-25: qty 20

## 2023-04-25 MED ORDER — ROCURONIUM BROMIDE 10 MG/ML (PF) SYRINGE
PREFILLED_SYRINGE | INTRAVENOUS | Status: DC | PRN
  Administered 2023-04-25: 50 mg via INTRAVENOUS

## 2023-04-25 MED ORDER — LIDOCAINE 2% (20 MG/ML) 5 ML SYRINGE
INTRAMUSCULAR | Status: DC | PRN
  Administered 2023-04-25: 60 mg via INTRAVENOUS

## 2023-04-25 MED ORDER — ACETAMINOPHEN 500 MG PO TABS
1000.0000 mg | ORAL_TABLET | Freq: Once | ORAL | Status: AC
  Administered 2023-04-25: 1000 mg via ORAL

## 2023-04-25 MED ORDER — ATORVASTATIN CALCIUM 10 MG PO TABS: 10.0000 mg | ORAL_TABLET | Freq: Every day | ORAL | Status: DC

## 2023-04-25 MED ORDER — CEFAZOLIN SODIUM-DEXTROSE 2-4 GM/100ML-% IV SOLN
2.0000 g | INTRAVENOUS | Status: AC
  Administered 2023-04-25: 2 g via INTRAVENOUS
  Filled 2023-04-25: qty 100

## 2023-04-25 MED ORDER — DICLOFENAC SODIUM 1 % EX GEL
2.0000 g | Freq: Four times a day (QID) | CUTANEOUS | Status: DC
  Administered 2023-04-25: 2 g via TOPICAL
  Filled 2023-04-25: qty 100

## 2023-04-25 MED ORDER — HYDROCODONE-ACETAMINOPHEN 5-325 MG PO TABS: 1.0000 | ORAL_TABLET | ORAL | Status: DC | PRN

## 2023-04-25 MED ORDER — LACTATED RINGERS IV SOLN: INTRAVENOUS | Status: DC | PRN

## 2023-04-25 MED ORDER — LACTATED RINGERS IV BOLUS
1000.0000 mL | Freq: Once | INTRAVENOUS | Status: AC
  Administered 2023-04-25: 1000 mL via INTRAVENOUS

## 2023-04-25 MED ORDER — ONDANSETRON HCL 4 MG/2ML IJ SOLN: 4.0000 mg | Freq: Four times a day (QID) | INTRAMUSCULAR | Status: DC | PRN

## 2023-04-25 SURGICAL SUPPLY — 57 items
BAG COUNTER SPONGE SURGICOUNT (BAG) ×1 IMPLANT
BENZOIN TINCTURE PRP APPL 2/3 (GAUZE/BANDAGES/DRESSINGS) IMPLANT
BIT DRILL LONG 4.2 (BIT) IMPLANT
BIT DRILL SHORT 4.2 (BIT) IMPLANT
BLADE SURG 10 STRL SS (BLADE) ×2 IMPLANT
BNDG COHESIVE 4X5 TAN STRL (GAUZE/BANDAGES/DRESSINGS) ×1 IMPLANT
BNDG ELASTIC 4INX 5YD STR LF (GAUZE/BANDAGES/DRESSINGS) IMPLANT
BNDG ELASTIC 4X5.8 VLCR STR LF (GAUZE/BANDAGES/DRESSINGS) ×1 IMPLANT
BNDG ELASTIC 6INX 5YD STR LF (GAUZE/BANDAGES/DRESSINGS) IMPLANT
BNDG ELASTIC 6X5.8 VLCR STR LF (GAUZE/BANDAGES/DRESSINGS) ×1 IMPLANT
BNDG GAUZE DERMACEA FLUFF 4 (GAUZE/BANDAGES/DRESSINGS) ×1 IMPLANT
BRUSH SCRUB EZ PLAIN DRY (MISCELLANEOUS) ×2 IMPLANT
CHLORAPREP W/TINT 26 (MISCELLANEOUS) ×1 IMPLANT
COVER SURGICAL LIGHT HANDLE (MISCELLANEOUS) ×2 IMPLANT
DRAPE C-ARM 42X72 X-RAY (DRAPES) ×1 IMPLANT
DRAPE C-ARMOR (DRAPES) ×1 IMPLANT
DRAPE HALF SHEET 40X57 (DRAPES) ×2 IMPLANT
DRAPE IMP U-DRAPE 54X76 (DRAPES) ×2 IMPLANT
DRAPE INCISE IOBAN 66X45 STRL (DRAPES) IMPLANT
DRAPE SURG ORHT 6 SPLT 77X108 (DRAPES) ×2 IMPLANT
DRAPE U-SHAPE 47X51 STRL (DRAPES) ×1 IMPLANT
DRILL BIT SHORT 4.2 (BIT) ×2
DRSG ADAPTIC 3X8 NADH LF (GAUZE/BANDAGES/DRESSINGS) ×1 IMPLANT
ELECT REM PT RETURN 9FT ADLT (ELECTROSURGICAL) ×1
ELECTRODE REM PT RTRN 9FT ADLT (ELECTROSURGICAL) ×1 IMPLANT
GAUZE SPONGE 4X4 12PLY STRL (GAUZE/BANDAGES/DRESSINGS) ×1 IMPLANT
GLOVE BIO SURGEON STRL SZ 6.5 (GLOVE) ×3 IMPLANT
GLOVE BIO SURGEON STRL SZ7.5 (GLOVE) ×4 IMPLANT
GLOVE BIOGEL PI IND STRL 6.5 (GLOVE) ×1 IMPLANT
GLOVE BIOGEL PI IND STRL 7.5 (GLOVE) ×1 IMPLANT
GOWN STRL REUS W/ TWL LRG LVL3 (GOWN DISPOSABLE) ×2 IMPLANT
GOWN STRL REUS W/TWL LRG LVL3 (GOWN DISPOSABLE) ×2
KIT BASIN OR (CUSTOM PROCEDURE TRAY) ×1 IMPLANT
KIT TURNOVER KIT B (KITS) ×1 IMPLANT
NAIL TIB TFNA 10X300 (Nail) IMPLANT
PACK TOTAL JOINT (CUSTOM PROCEDURE TRAY) ×1 IMPLANT
PAD ARMBOARD 7.5X6 YLW CONV (MISCELLANEOUS) ×2 IMPLANT
PAD CAST 4YDX4 CTTN HI CHSV (CAST SUPPLIES) IMPLANT
PADDING CAST COTTON 4X4 STRL (CAST SUPPLIES) ×1
PADDING CAST COTTON 6X4 STRL (CAST SUPPLIES) IMPLANT
REAMER ROD DEEP FLUTE 2.5X950 (INSTRUMENTS) IMPLANT
SCREW LOCK HDLS 5X54 NSTRL (Screw) IMPLANT
SCREW LOCK IM NAIL 5X30 (Screw) IMPLANT
SCREW LOCK LP 5X38 LT (Screw) IMPLANT
SCREW LOCK LP THRD 5X32 NS (Screw) IMPLANT
STAPLER VISISTAT 35W (STAPLE) ×1 IMPLANT
STRIP CLOSURE SKIN 1/2X4 (GAUZE/BANDAGES/DRESSINGS) IMPLANT
SUT MNCRL AB 3-0 PS2 18 (SUTURE) ×1 IMPLANT
SUT MON AB 2-0 CT1 36 (SUTURE) IMPLANT
SUT VIC AB 0 CT1 27 (SUTURE) ×1
SUT VIC AB 0 CT1 27XBRD ANBCTR (SUTURE) IMPLANT
SUT VIC AB 2-0 CT1 27 (SUTURE)
SUT VIC AB 2-0 CT1 TAPERPNT 27 (SUTURE) IMPLANT
SUT VIC AB CT1 27XBRD ANBCTRL (SUTURE) ×1
TOWEL GREEN STERILE (TOWEL DISPOSABLE) ×2 IMPLANT
TOWEL GREEN STERILE FF (TOWEL DISPOSABLE) ×1 IMPLANT
YANKAUER SUCT BULB TIP NO VENT (SUCTIONS) IMPLANT

## 2023-04-25 NOTE — Progress Notes (Signed)
Orthopedic Tech Progress Note Patient Details:  KYTZIA HESTERBERG Dec 01, 1940 [id number]  Ortho Devices Type of Ortho Device: Long leg splint, Stirrup splint Ortho Device/Splint Location: LLE Ortho Device/Splint Interventions: Ordered, Application, Adjustment   Post Interventions Patient Tolerated: Well Instructions Provided: Care of device  [place] A Gerilyn Pilgrim 04/15/2023, 6:54 AM

## 2023-04-25 NOTE — ED Notes (Signed)
Daughter updated. Daughter would like an update from Careers adviser after surgery .

## 2023-04-25 NOTE — ED Notes (Signed)
ED TO INPATIENT HANDOFF REPORT  ED Nurse Name and Phone #: Marchelle Folks RN [phone]  S Name/Age/Gender Sabrina Baker 82 y.o. female Room/Bed: TRAAC/TRAAC  Code Status   Code Status: Prior  Home/SNF/Other Home Patient oriented to: self, place, time, and situation Is this baseline? Yes   Triage Complete: Triage complete  Chief Complaint Fracture of tibial shaft, closed [S82.209A]  Triage Note Patient from home, fell walking to the bathroom. Said her leg gave out. She is on Eliquis. Denies hitting her head, no neck or back pain. Says she has right shoulder and right knee pain. Left leg deformity, EMS says there is some shortening and lateral rotation. CSMS intact.Patient is alert, oriented x4 and no bleeding.  VS  172/86 71 96% CBG 228    Allergies Allergies  Allergen Reactions   Ramipril Anaphylaxis and Other (See Comments)   Cortisone Itching and Rash   Niacin Diarrhea    And nausea   Niacin And Related Diarrhea   Valsartan Other (See Comments)    Hyperkalemia     Level of Care/Admitting Diagnosis ED Disposition     ED Disposition  Admit   Condition  --   Comment  Hospital Area: [facility] [100100]  Level of Care: Telemetry Medical [104]  May admit patient to [facility] or [facility] if equivalent level of care is available:: No  Covid Evaluation: Asymptomatic - no recent exposure (last 10 days) testing not required  Diagnosis: Fracture of tibial shaft, closed [130865]  Admitting Physician: REYGAN, EVELAND [[id number]]  Attending Physician: LEARLINE, FREEBERG [[id number]]  Certification:: I certify this patient will need inpatient services for at least 2 midnights  Expected Medical Readiness: 04/27/2023          B Medical/Surgery History Past Medical History:  Diagnosis Date   Acute diastolic congestive heart failure (HCC)    Breast cancer (HCC)    right breast   Carotid stenosis    Carotid US 1/22: Bilateral ICA 1-39; follow-up as needed    Chronic kidney disease    stage 3   Diabetes mellitus without complication (HCC)    Gout    Hypercholesteremia    Hypertension    Neuromuscular disorder (HCC)    neuropathy   Osteopenia    Retinal detachment    Spinal stenosis    TIA (transient ischemic attack)    Vitamin B12 deficiency    Past Surgical History:  Procedure Laterality Date   25 GAUGE PARS PLANA VITRECTOMY WITH 20 GAUGE MVR PORT Right 03/16/2015   Procedure: 25 GAUGE PARS PLANA VITRECTOMY WITH 23 GAUGE MVR PORT;  Surgeon: Sherrie George, MD;  Location: [facility] OR;  Service: Ophthalmology;  Laterality: Right;   ANKLE SURGERY     BREAST BIOPSY Right 06/28/2022   [place] RT BREAST BX W LOC DEV 1ST LESION IMG BX SPEC US GUIDE 06/28/2022 [facility]   BREAST BIOPSY  08/02/2022   MM RT RADIOACTIVE SEED LOC MAMMO GUIDE 08/02/2022 [facility]   BREAST EXCISIONAL BIOPSY     BREAST LUMPECTOMY     BREAST LUMPECTOMY WITH RADIOACTIVE SEED LOCALIZATION Right 01/24/2016   Procedure: RIGHT BREAST LUMPECTOMY WITH RADIOACTIVE SEED LOCALIZATION;  Surgeon: Abigail Miyamoto, MD;  Location: [facility];  Service: General;  Laterality: Right;   BREAST LUMPECTOMY WITH RADIOACTIVE SEED LOCALIZATION Right 08/03/2022   Procedure: RIGHT BREAST LUMPECTOMY WITH RADIOACTIVE SEED LOCALIZATION;  Surgeon: Abigail Miyamoto, MD;  Location: [facility];  Service: General;  Laterality:  Right;  LMA   EYE SURGERY     detached retna X2   HEEL SPUR SURGERY  2012   INCISION AND DRAINAGE ABSCESS Right 11/21/2019   Procedure: INCISION AND DRAINAGE FOREHEAD & RIGHT EAR ABSCESS;  Surgeon: Osborn Coho, MD;  Location: [facility] OR;  Service: ENT;  Laterality: Right;   INJECTION OF SILICONE OIL Right 03/16/2015   Procedure: Extraction OF SILICONE OIL;  Surgeon: Sherrie George, MD;  Location: [facility] OR;  Service: Ophthalmology;  Laterality: Right;   ORIF ANKLE FRACTURE Left 06/17/2014   Procedure: OPEN REDUCTION INTERNAL FIXATION (ORIF)  LEFT BIMALLEOLAR ANKLE FRACTURE;  Surgeon: Cheral Almas, MD;  Location: [facility] OR;  Service: Orthopedics;  Laterality: Left;   PHOTOCOAGULATION WITH LASER Right 03/16/2015   Procedure: PHOTOCOAGULATION WITH LASER;  Surgeon: Sherrie George, MD;  Location: [facility] OR;  Service: Ophthalmology;  Laterality: Right;   SHOULDER ARTHROSCOPY Right    plates and screws   VITRECTOMY Right 03/16/2015   WRIST ARTHROPLASTY Left    plates and screws and bone graft     A IV Location/Drains/Wounds Patient Lines/Drains/Airways Status     Active Line/Drains/Airways     Name Placement date Placement time Site Days   Peripheral IV 05/12/2023 20 G Left Antecubital 04/18/2023  0456  Antecubital  less than 1   Wound / Incision (Open or Dehisced) 03/03/21 Other (Comment) Face Left unable to assess, dressing present 03/03/21  1830  Face  783            Intake/Output Last 24 hours No intake or output data in the 24 hours ending 04/16/2023 0942  Labs/Imaging Results for orders placed or performed during the hospital encounter of 05/05/2023 (from the past 48 hour(s))  Sample to Blood Bank     Status: None   Collection Time: 04/19/2023  4:50 AM  Result Value Ref Range   Blood Bank Specimen SAMPLE AVAILABLE FOR TESTING    Sample Expiration      04/28/2023,2359 Performed at [facility], [address] [place], [place] [address]   Urinalysis, Routine w reflex microscopic -Urine, Clean Catch     Status: Abnormal   Collection Time: 04/17/2023  5:00 AM  Result Value Ref Range   Color, Urine STRAW (A) YELLOW   APPearance CLEAR CLEAR   Specific Gravity, Urine 1.008 1.005 - 1.030   pH 5.0 5.0 - 8.0   Glucose, UA 50 (A) NEGATIVE mg/dL   Hgb urine dipstick NEGATIVE NEGATIVE   Bilirubin Urine NEGATIVE NEGATIVE   Ketones, ur NEGATIVE NEGATIVE mg/dL   Protein, ur NEGATIVE NEGATIVE mg/dL   Nitrite NEGATIVE NEGATIVE   Leukocytes,Ua TRACE (A) NEGATIVE   RBC / HPF 0-5 0 - 5 RBC/hpf   WBC, UA 0-5 0 - 5 WBC/hpf    Bacteria, UA NONE SEEN NONE SEEN   Squamous Epithelial / HPF 0-5 0 - 5 /HPF    Comment: Performed at [facility], [address] [place], [place] [address]  CBC with Differential     Status: Abnormal   Collection Time: 05/09/2023  5:02 AM  Result Value Ref Range   WBC 12.6 (H) 4.0 - 10.5 K/uL   RBC 4.18 3.87 - 5.11 MIL/uL   Hemoglobin 13.4 12.0 - 15.0 g/dL   HCT 47.8 29.5 - 62.1 %   MCV 100.5 (H) 80.0 - 100.0 fL   MCH 32.1 26.0 - 34.0 pg   MCHC 31.9 30.0 - 36.0 g/dL   RDW 30.8 65.7 -  15.5 %   Platelets 238 150 - 400 K/uL   nRBC 0.0 0.0 - 0.2 %   Neutrophils Relative % 79 %   Neutro Abs 9.9 (H) 1.7 - 7.7 K/uL   Lymphocytes Relative 11 %   Lymphs Abs 1.4 0.7 - 4.0 K/uL   Monocytes Relative 7 %   Monocytes Absolute 0.9 0.1 - 1.0 K/uL   Eosinophils Relative 2 %   Eosinophils Absolute 0.2 0.0 - 0.5 K/uL   Basophils Relative 0 %   Basophils Absolute 0.0 0.0 - 0.1 K/uL   Immature Granulocytes 1 %   Abs Immature Granulocytes 0.08 (H) 0.00 - 0.07 K/uL    Comment: Performed at [facility], [address] [place], [place] [address]  Basic metabolic panel     Status: Abnormal   Collection Time: 04/28/2023  5:02 AM  Result Value Ref Range   Sodium 134 (L) 135 - 145 mmol/L   Potassium 5.2 (H) 3.5 - 5.1 mmol/L   Chloride 100 98 - 111 mmol/L   CO2 26 22 - 32 mmol/L   Glucose, Bld 236 (H) 70 - 99 mg/dL    Comment: Glucose reference range applies only to samples taken after fasting for at least 8 hours.   BUN 38 (H) 8 - 23 mg/dL   Creatinine, Ser 2.53 (H) 0.44 - 1.00 mg/dL   Calcium 9.6 8.9 - 66.4 mg/dL   GFR, Estimated 43 (L) >60 mL/min    Comment: (NOTE) Calculated using the CKD-EPI Creatinine Equation (2021)    Anion gap 8 5 - 15    Comment: Performed at [facility], [address], [place], [place] [address]  Protime-INR     Status: None   Collection Time: 04/23/2023  5:02 AM  Result Value Ref Range   Prothrombin Time 13.7 11.4 - 15.2 seconds   INR 1.0 0.8 - 1.2     Comment: (NOTE) INR goal varies based on device and disease states. Performed at [facility], [address], [place], [place] [address]   Hepatic function panel     Status: Abnormal   Collection Time: 04/24/2023  5:02 AM  Result Value Ref Range   Total Protein 6.5 6.5 - 8.1 g/dL   Albumin 3.3 (L) 3.5 - 5.0 g/dL   AST 18 15 - 41 U/L   ALT 14 0 - 44 U/L   Alkaline Phosphatase 65 38 - 126 U/L   Total Bilirubin 0.5 <1.2 mg/dL   Bilirubin, Direct 0.1 0.0 - 0.2 mg/dL   Indirect Bilirubin 0.4 0.3 - 0.9 mg/dL    Comment: Performed at [facility], [address] [place], [place] [address]  Sabrina natriuretic peptide     Status: Abnormal   Collection Time: 05/12/2023  5:02 AM  Result Value Ref Range   B Natriuretic Peptide 245.0 (H) 0.0 - 100.0 pg/mL    Comment: Performed at [facility], [address] [place], [place] [address]  Troponin I (High Sensitivity)     Status: Abnormal   Collection Time: 05/09/2023  5:02 AM  Result Value Ref Range   Troponin I (High Sensitivity) 21 (H) <18 ng/L    Comment: (NOTE) Elevated high sensitivity troponin I (hsTnI) values and significant  changes across serial measurements may suggest ACS but many other  chronic and acute conditions are known to elevate hsTnI results.  Refer to the "Links" section for chest pain algorithms and additional  guidance. Performed at [facility], [address], [place], [place] [address]   I-Stat Chem 8, ED     Status: Abnormal   Collection Time: 04/28/2023  5:13 AM  Result Value Ref Range   Sodium 132 (L) 135 - 145 mmol/L   Potassium 5.2 (H) 3.5 - 5.1 mmol/L   Chloride 105 98 - 111 mmol/L   BUN 47 (H) 8 - 23 mg/dL   Creatinine, Ser 5.64 (H) 0.44 - 1.00 mg/dL   Glucose, Bld 332 (H) 70 - 99 mg/dL    Comment: Glucose reference range applies only to samples taken after fasting for at least 8 hours.   Calcium, Ion 0.87 (LL) 1.15 - 1.40 mmol/L   TCO2 27 22 - 32 mmol/L   Hemoglobin 14.3 12.0 -  15.0 g/dL   HCT 95.1 88.4 - 16.6 %   Comment NOTIFIED PHYSICIAN   Ethanol     Status: None   Collection Time: 05/09/2023  5:15 AM  Result Value Ref Range   Alcohol, Ethyl (B) <10 <10 mg/dL    Comment: (NOTE) Lowest detectable limit for serum alcohol is 10 mg/dL.  For medical purposes only. Performed at [facility], [address], [place], [place] [address]   I-Stat Lactic Acid, ED     Status: None   Collection Time: 04/21/2023  5:19 AM  Result Value Ref Range   Lactic Acid, Venous 1.0 0.5 - 1.9 mmol/L  Troponin I (High Sensitivity)     Status: None   Collection Time: 04/24/2023  8:29 AM  Result Value Ref Range   Troponin I (High Sensitivity) 17 <18 ng/L    Comment: (NOTE) Elevated high sensitivity troponin I (hsTnI) values and significant  changes across serial measurements may suggest ACS but many other  chronic and acute conditions are known to elevate hsTnI results.  Refer to the "Links" section for chest pain algorithms and additional  guidance. Performed at [facility], [address] [place], [place] [address]    CT Angio Chest PE W and/or Wo Contrast  Result Date: 04/24/2023 CLINICAL DATA:  82 year old female with history of trauma from a fall. Suspected pulmonary embolism. EXAM: CT ANGIOGRAPHY CHEST WITH CONTRAST TECHNIQUE: Multidetector CT imaging of the chest was performed using the standard protocol during bolus administration of intravenous contrast. Multiplanar CT image reconstructions and MIPs were obtained to evaluate the vascular anatomy. RADIATION DOSE REDUCTION: This exam was performed according to the departmental dose-optimization program which includes automated exposure control, adjustment of the mA and/or kV according to patient size and/or use of iterative reconstruction technique. CONTRAST:  60mL ISOVUE-370 IOPAMIDOL (ISOVUE-370) INJECTION 76% COMPARISON:  No priors. FINDINGS: Cardiovascular: No filling defects are noted within the pulmonary  arterial tree to suggest pulmonary embolism. Heart size is normal. Trace amount of pericardial fluid and/or thickening, unlikely to be of any hemodynamic significance at this time. No pericardial calcification. There is aortic atherosclerosis, as well as atherosclerosis of the great vessels of the mediastinum and the coronary arteries, including calcified atherosclerotic plaque in the left anterior descending and left circumflex coronary arteries. Calcifications of the aortic valve. Calcifications of the mitral annulus. Mediastinum/Nodes: No pathologically enlarged mediastinal or hilar lymph nodes. Small hiatal hernia. No axillary lymphadenopathy. Lungs/Pleura: Patchy areas of ground-glass attenuation, septal thickening, thickening of the peribronchovascular interstitium and mild cylindrical bronchiectasis scattered throughout the lungs bilaterally, likely areas of chronic post infectious or inflammatory scarring. No confluent consolidative airspace disease. No pleural effusions. No pneumothorax. No definite suspicious appearing  pulmonary nodules or masses are noted. Upper Abdomen: Aortic atherosclerosis. Musculoskeletal: Postoperative changes of ORIF in the right proximal humerus partially imaged. There are no aggressive appearing lytic or blastic lesions noted in the visualized portions of the skeleton. Review of the MIP images confirms the above findings. IMPRESSION: 1. No evidence of pulmonary embolism. 2. Trace amount of pericardial fluid and/or thickening, unlikely to be of any hemodynamic significance at this time. No pericardial calcification. 3. Aortic atherosclerosis, in addition to two-vessel coronary artery disease. 4. There are calcifications of the aortic valve and mitral annulus. Echocardiographic correlation for evaluation of potential valvular dysfunction may be warranted if clinically indicated. 5. The appearance of the lungs suggests areas of chronic post infectious or inflammatory scarring,  although the possibility of interstitial lung disease is not excluded. Outpatient referral to Pulmonology for further clinical evaluation is recommended, with consideration for follow-up nonemergent outpatient high-resolution chest CT if clinically appropriate. Aortic Atherosclerosis (ICD10-I70.0). Electronically Signed   By: Trudie Reed M.D.   On: 04/29/2023 08:15   DG Pelvis Portable  Result Date: 05/12/2023 CLINICAL DATA:  Fall injury with polytrauma. EXAM: RIGHT KNEE - 1-2 VIEW; PORTABLE CHEST - 1 VIEW; PORTABLE PELVIS 1-2 VIEWS; LEFT TIBIA AND FIBULA - 2 VIEW COMPARISON:  PA and lateral chest 04/15/2021, AP pelvis 01/23/2022. No prior right knee films. No prior left tibia fibula film. FINDINGS: Chest AP portable 5:37 a.m.: There is mild cardiomegaly without evidence of CHF. The lungs clear aside from chronic left upper lobe linear scarring. There is mild chronic elevation of the right diaphragm. The mediastinum is stable with aortic tortuosity and patchy calcification. No pleural effusion or pneumothorax is evident. There is osteopenia with degenerative changes and slight dextroscoliosis of the thoracic spine. No acute osseous findings. 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 Portable AP pelvis single view: No pelvic fracture or diastasis is seen. There is osteopenia. There is mild arthrosis symmetrically at the hips, SI joints, pubic symphysis. Both proximal femurs are grossly intact AP. There is calcification in both common iliac arteries, right pelvic phleboliths. Right knee, AP Lat only: There is osteopenia without evidence of fracture or dislocation. No focal osseous lesion is suspected. 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 There is a linear calcific body posterior to the joint space. Unclear if  this is a fabella or an intra-articular loose body. There is no suprapatellar bursal effusion. Superficial soft tissues are unremarkable. Left tibia fibula series, AP and lateral with multiple films obtained: Osteopenia. There is an acute transverse comminuted and mildly impacted fibular neck fracture, with mild spreading of the fragments and no other displacement of this fracture. There is an acute spiral oblique fracture at about the junction of the mid and distal thirds of the tibial shaft, with 1/2 of a shaft width of lateral displacement of the distal fragment and otherwise with no other significant displacement. There is a hairline nondisplaced oblique fracture in the distal tibia distal to the spiral fracture site. There are no further acute fractures. There are 2 old fracture fixation screws inserted through the medial malleolus, two through the posterior calcaneus. There is also a chronic lateral sideplate fixation of the distal fibular shaft and malleolus. There is moderate secondary arthrosis at the ankle, midfoot collapse partially visible on 1 of the lateral views, and pes planus. There is mild generalized edema in  the foreleg and ankle. IMPRESSION: 1. Acute transverse comminuted and mildly impacted fibular neck fracture, with mild spreading of the fragments. 2. Acute spiral oblique fracture, junction of the mid and distal thirds of the tibial shaft, with 1/2 of a shaft width of lateral displacement of the distal fragment. 3. Hairline nondisplaced oblique fracture in the distal tibia distal to the spiral fracture site. 4. Osteopenia and degenerative change without evidence of right knee fracture or dislocation. 5. No acute chest findings. Chronic changes as above. 6. No evidence of pelvic fracture or diastasis. 7. Aortic and iliac atherosclerosis. 8. Old fracture fixation screws through the medial malleolus, posterior calcaneus, and distal fibular shaft and malleolus. 9. Midfoot collapse partially  visible on 1 of the lateral views, and pes planus. Electronically Signed   By: Almira Bar M.D.   On: 05/03/2023 06:34   DG Chest Port 1 View  Result Date: 05/01/2023 CLINICAL DATA:  Fall injury with polytrauma. EXAM: RIGHT KNEE - 1-2 VIEW; PORTABLE CHEST - 1 VIEW; PORTABLE PELVIS 1-2 VIEWS; LEFT TIBIA AND FIBULA - 2 VIEW COMPARISON:  PA and lateral chest 04/15/2021, AP pelvis 01/23/2022. No prior right knee films. No prior left tibia fibula film. FINDINGS: Chest AP portable 5:37 a.m.: There is mild cardiomegaly without evidence of CHF. The lungs clear aside from chronic left upper lobe linear scarring. There is mild chronic elevation of the right diaphragm. The mediastinum is stable with aortic tortuosity and patchy calcification. No pleural effusion or pneumothorax is evident. There is osteopenia with degenerative changes and slight dextroscoliosis of the thoracic spine. No acute osseous findings. 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 Portable AP pelvis single view: No pelvic fracture or diastasis is seen. There is osteopenia. There is mild arthrosis symmetrically at the hips, SI joints, pubic symphysis. Both proximal femurs are grossly intact AP. There is calcification in both common iliac arteries, right pelvic phleboliths. Right knee, AP Lat only: There is osteopenia without evidence of fracture or dislocation. No focal osseous lesion is suspected. 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 There is a linear calcific body posterior to the joint space. Unclear if this is a fabella or an intra-articular loose body. There is no suprapatellar bursal effusion. Superficial soft tissues are unremarkable. Left tibia fibula series, AP and lateral with multiple films obtained: Osteopenia. There is an acute transverse  comminuted and mildly impacted fibular neck fracture, with mild spreading of the fragments and no other displacement of this fracture. There is an acute spiral oblique fracture at about the junction of the mid and distal thirds of the tibial shaft, with 1/2 of a shaft width of lateral displacement of the distal fragment and otherwise with no other significant displacement. There is a hairline nondisplaced oblique fracture in the distal tibia distal to the spiral fracture site. There are no further acute fractures. There are 2 old fracture fixation screws inserted through the medial malleolus, two through the posterior calcaneus. There is also a chronic lateral sideplate fixation of the distal fibular shaft and malleolus. There is moderate secondary arthrosis at the ankle, midfoot collapse partially visible on 1 of the lateral views, and pes planus. There is mild generalized edema in the foreleg and ankle. IMPRESSION: 1. Acute transverse comminuted and mildly impacted fibular neck fracture, with mild spreading of the fragments. 2. Acute spiral oblique fracture, junction of  the mid and distal thirds of the tibial shaft, with 1/2 of a shaft width of lateral displacement of the distal fragment. 3. Hairline nondisplaced oblique fracture in the distal tibia distal to the spiral fracture site. 4. Osteopenia and degenerative change without evidence of right knee fracture or dislocation. 5. No acute chest findings. Chronic changes as above. 6. No evidence of pelvic fracture or diastasis. 7. Aortic and iliac atherosclerosis. 8. Old fracture fixation screws through the medial malleolus, posterior calcaneus, and distal fibular shaft and malleolus. 9. Midfoot collapse partially visible on 1 of the lateral views, and pes planus. Electronically Signed   By: Almira Bar M.D.   On: 04/13/2023 06:34   DG Knee 2 Views Right  Result Date: 04/15/2023 CLINICAL DATA:  Fall injury with polytrauma. EXAM: RIGHT KNEE - 1-2 VIEW;  PORTABLE CHEST - 1 VIEW; PORTABLE PELVIS 1-2 VIEWS; LEFT TIBIA AND FIBULA - 2 VIEW COMPARISON:  PA and lateral chest 04/15/2021, AP pelvis 01/23/2022. No prior right knee films. No prior left tibia fibula film. FINDINGS: Chest AP portable 5:37 a.m.: There is mild cardiomegaly without evidence of CHF. The lungs clear aside from chronic left upper lobe linear scarring. There is mild chronic elevation of the right diaphragm. The mediastinum is stable with aortic tortuosity and patchy calcification. No pleural effusion or pneumothorax is evident. There is osteopenia with degenerative changes and slight dextroscoliosis of the thoracic spine. No acute osseous findings. 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 Portable AP pelvis single view: No pelvic fracture or diastasis is seen. There is osteopenia. There is mild arthrosis symmetrically at the hips, SI joints, pubic symphysis. Both proximal femurs are grossly intact AP. There is calcification in both common iliac arteries, right pelvic phleboliths. Right knee, AP Lat only: There is osteopenia without evidence of fracture or dislocation. No focal osseous lesion is suspected. 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 There is a linear calcific body posterior to the joint space. Unclear if this is a fabella or an intra-articular loose body. There is no suprapatellar bursal effusion. Superficial soft tissues are unremarkable. Left tibia fibula series, AP and lateral with multiple films obtained: Osteopenia. There is an acute transverse comminuted and mildly impacted fibular neck fracture, with mild spreading of the fragments and no other displacement of this fracture. There is an acute spiral oblique fracture at about the junction of the mid and distal thirds of the tibial shaft,  with 1/2 of a shaft width of lateral displacement of the distal fragment and otherwise with no other significant displacement. There is a hairline nondisplaced oblique fracture in the distal tibia distal to the spiral fracture site. There are no further acute fractures. There are 2 old fracture fixation screws inserted through the medial malleolus, two through the posterior calcaneus. There is also a chronic lateral sideplate fixation of the distal fibular shaft and malleolus. There is moderate secondary arthrosis at the ankle, midfoot collapse partially visible on 1 of the lateral views, and pes planus. There is mild generalized edema in the foreleg and ankle. IMPRESSION: 1. Acute transverse comminuted and mildly impacted fibular neck fracture, with mild spreading of the fragments. 2. Acute spiral oblique fracture, junction of the mid and distal thirds of the tibial shaft, with 1/2 of a shaft width of lateral displacement of the distal fragment. 3. Hairline nondisplaced oblique fracture in  the distal tibia distal to the spiral fracture site. 4. Osteopenia and degenerative change without evidence of right knee fracture or dislocation. 5. No acute chest findings. Chronic changes as above. 6. No evidence of pelvic fracture or diastasis. 7. Aortic and iliac atherosclerosis. 8. Old fracture fixation screws through the medial malleolus, posterior calcaneus, and distal fibular shaft and malleolus. 9. Midfoot collapse partially visible on 1 of the lateral views, and pes planus. Electronically Signed   By: Almira Bar M.D.   On: 04/28/2023 06:34   DG Tibia/Fibula Left  Result Date: 05/11/2023 CLINICAL DATA:  Fall injury with polytrauma. EXAM: RIGHT KNEE - 1-2 VIEW; PORTABLE CHEST - 1 VIEW; PORTABLE PELVIS 1-2 VIEWS; LEFT TIBIA AND FIBULA - 2 VIEW COMPARISON:  PA and lateral chest 04/15/2021, AP pelvis 01/23/2022. No prior right knee films. No prior left tibia fibula film. FINDINGS: Chest AP portable 5:37 a.m.: There  is mild cardiomegaly without evidence of CHF. The lungs clear aside from chronic left upper lobe linear scarring. There is mild chronic elevation of the right diaphragm. The mediastinum is stable with aortic tortuosity and patchy calcification. No pleural effusion or pneumothorax is evident. There is osteopenia with degenerative changes and slight dextroscoliosis of the thoracic spine. No acute osseous findings. 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 Portable AP pelvis single view: No pelvic fracture or diastasis is seen. There is osteopenia. There is mild arthrosis symmetrically at the hips, SI joints, pubic symphysis. Both proximal femurs are grossly intact AP. There is calcification in both common iliac arteries, right pelvic phleboliths. Right knee, AP Lat only: There is osteopenia without evidence of fracture or dislocation. No focal osseous lesion is suspected. 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 There is a linear calcific body posterior to the joint space. Unclear if this is a fabella or an intra-articular loose body. There is no suprapatellar bursal effusion. Superficial soft tissues are unremarkable. Left tibia fibula series, AP and lateral with multiple films obtained: Osteopenia. There is an acute transverse comminuted and mildly impacted fibular neck fracture, with mild spreading of the fragments and no other displacement of this fracture. There is an acute spiral oblique fracture at about the junction of the mid and distal thirds of the tibial shaft, with 1/2 of a shaft width of lateral displacement of the distal fragment and otherwise with no other significant displacement. There is a hairline nondisplaced oblique fracture in the distal tibia distal to the spiral fracture site. There are no further  acute fractures. There are 2 old fracture fixation screws inserted through the medial malleolus, two through the posterior calcaneus. There is also a chronic lateral sideplate fixation of the distal fibular shaft and malleolus. There is moderate secondary arthrosis at the ankle, midfoot collapse partially visible on 1 of the lateral views, and pes planus. There is mild generalized edema in the foreleg and ankle. IMPRESSION: 1. Acute transverse comminuted and mildly impacted fibular neck fracture, with mild spreading of the fragments. 2. Acute spiral oblique fracture, junction of the mid and distal thirds of the tibial shaft, with 1/2 of a shaft width of lateral displacement of the distal fragment. 3. Hairline nondisplaced oblique fracture in the distal tibia distal to the spiral fracture site. 4. Osteopenia and degenerative change without evidence of right knee fracture or dislocation. 5. No acute chest findings. Chronic changes as  above. 6. No evidence of pelvic fracture or diastasis. 7. Aortic and iliac atherosclerosis. 8. Old fracture fixation screws through the medial malleolus, posterior calcaneus, and distal fibular shaft and malleolus. 9. Midfoot collapse partially visible on 1 of the lateral views, and pes planus. Electronically Signed   By: Almira Bar M.D.   On: 04/21/2023 06:34   CT HEAD WO CONTRAST ( )  Result Date: 04/17/2023 CLINICAL DATA:  Fall at home.  Blunt poly trauma EXAM: CT HEAD WITHOUT CONTRAST CT CERVICAL SPINE WITHOUT CONTRAST TECHNIQUE: Multidetector CT imaging of the head and cervical spine was performed following the standard protocol without intravenous contrast. Multiplanar CT image reconstructions of the cervical spine were also generated. RADIATION DOSE REDUCTION: This exam was performed according to the departmental dose-optimization program which includes automated exposure control, adjustment of the mA and/or kV according to patient size and/or use of iterative  reconstruction technique. COMPARISON:  Sabrina MRI 01/07/2022 FINDINGS: CT HEAD FINDINGS Sabrina: No evidence of acute infarction, hemorrhage, hydrocephalus, extra-axial collection or mass lesion/mass effect. Cerebral volume loss and chronic small vessel ischemia that is mild for age. Vascular: No hyperdense vessel or unexpected calcification. Skull: No acute finding Sinuses/Orbits: No evidence of injury.  Right scleral band. CT CERVICAL SPINE FINDINGS Alignment: No traumatic malalignment.  Mild scoliosis Skull base and vertebrae: No acute fracture. No primary bone lesion or focal pathologic process. Soft tissues and spinal canal: No prevertebral fluid or swelling. No visible canal hematoma. Disc levels: Advanced and generalized disc and facet degeneration with diffuse spurring and disc collapse. Endplate sclerosis at multiple levels. 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 Upper chest: No visible injury IMPRESSION: No evidence of acute intracranial or cervical spine injury. Electronically Signed   By: Tiburcio Pea M.D.   On: 04/13/2023 05:42   CT Cervical Spine Wo Contrast  Result Date: 04/29/2023 CLINICAL DATA:  Fall at home.  Blunt poly trauma EXAM: CT HEAD WITHOUT CONTRAST CT CERVICAL SPINE WITHOUT CONTRAST TECHNIQUE: Multidetector CT imaging of the head and cervical spine was performed following the standard protocol without intravenous contrast. Multiplanar CT image reconstructions of the cervical spine were also generated. RADIATION DOSE REDUCTION: This exam was performed according to the departmental dose-optimization program which includes automated exposure control, adjustment of the mA and/or kV according to patient size and/or use of iterative reconstruction technique. COMPARISON:  Sabrina MRI 01/07/2022 FINDINGS: CT HEAD FINDINGS Sabrina: No evidence of acute infarction, hemorrhage, hydrocephalus, extra-axial collection or mass lesion/mass effect. Cerebral volume loss and chronic small vessel ischemia  that is mild for age. Vascular: No hyperdense vessel or unexpected calcification. Skull: No acute finding Sinuses/Orbits: No evidence of injury.  Right scleral band. CT CERVICAL SPINE FINDINGS Alignment: No traumatic malalignment.  Mild scoliosis Skull base and vertebrae: No acute fracture. No primary bone lesion or focal pathologic process. Soft tissues and spinal canal: No prevertebral fluid or swelling. No visible canal hematoma. Disc levels: Advanced and generalized disc and facet degeneration with diffuse spurring and disc collapse. Endplate sclerosis at multiple levels. 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 Upper chest: No visible injury IMPRESSION: No evidence of acute intracranial or cervical spine injury. Electronically Signed   By: Tiburcio Pea M.D.   On: 05/09/2023 05:42    Pending Labs Unresulted Labs (From admission, onward)     Start     Ordered   04/29/2023 0827  Surgical pcr screen  Once,   URGENT        05/04/2023 7829  Vitals/Pain Today's Vitals   05/07/2023 0745 04/27/2023 0815 04/28/2023 0841 05/03/2023 0930  BP: (!) 100/47 (!) 149/76  (!) 141/80  Pulse: 77 71  75  Resp: (!) 27 (!) 21  18  Temp:   99.1 F (37.3 C)   SpO2: 100% 94%  93%  PainSc:        Isolation Precautions No active isolations  Medications Medications  fentaNYL (SUBLIMAZE) injection 50 mcg (has no administration in time range)  fentaNYL (SUBLIMAZE) injection 50 mcg (50 mcg Intravenous Given 04/19/2023 0501)  lactated ringers bolus 1,000 mL (0 mLs Intravenous Stopped 05/07/2023 0633)  fentaNYL (SUBLIMAZE) injection 50 mcg (50 mcg Intravenous Given 04/22/2023 0602)  iopamidol (ISOVUE-370) 76 % injection 60 mL (60 mLs Intravenous Contrast Given 05/04/2023 0756)    Mobility walks     Focused Assessments Cardiac Assessment Handoff:  Cardiac Rhythm: Normal sinus rhythm Lab Results  Component Value Date   CKTOTAL 11 (L) 09/22/2018   TROPONINI 0.04 (HH) 09/21/2018   Lab Results  Component  Value Date   DDIMER 1.03 (H) 09/22/2018   Does the Patient currently have chest pain? No    R Recommendations: See Admitting Provider Note  Report given to:   Additional Notes: pt ambulates independently at baseline

## 2023-04-25 NOTE — ED Provider Notes (Signed)
[facility]-EMERGENCY DEPT [facility] Emergency Department Provider Note MRN:  [id number]  Arrival date & time: 05/08/2023     Chief Complaint   Fall   History of Present Illness   Sabrina Baker is a 82 y.o. year-old female presents to the ED with chief complaint of fall.  She states that she was walking to the bathroom with her rollator and the walker got away from her.  She states that she slid down to the ground.  She says she's not sure if she hit her head.  Denies LOC.  She does take eliquis.  She complains of left leg pain, right knee pain, and right  shoulder pain.  She states that she is having a hard time straightening her left leg.  She rates her pain as 8/10.  She denies any other associated symptoms.  History provided by patient.   Review of Systems  Pertinent positive and negative review of systems noted in HPI.    Physical Exam   Vitals:   04/15/2023 1323 04/21/2023 1328  BP: (!) 160/82   Pulse: 78   Resp:    Temp:    SpO2: 90% 92%    CONSTITUTIONAL:  non toxic-appearing, NAD NEURO:  Alert and oriented x 3, CN 3-12 grossly intact EYES:  eyes equal and reactive ENT/NECK:  Supple, no stridor  CARDIO:  normal rate, regular rhythm, appears well-perfused  PULM:  No respiratory distress, CTAB GI/GU:  non-distended,  MSK/SPINE:  hematoma and deformity to left shin, right knee without abnormality, moves both upper extremities, no deformity noted of upper extremities SKIN:  no rash, hematoma to left shin   *Additional and/or pertinent findings included in MDM below  Diagnostic and Interventional Summary    EKG Interpretation Date/Time:    Ventricular Rate:    PR Interval:    QRS Duration:    QT Interval:    QTC Calculation:   R Axis:      Text Interpretation:         Labs Reviewed  SURGICAL PCR SCREEN - Abnormal; Notable for the following components:      Result Value   Staphylococcus aureus POSITIVE (*)    All other components within normal limits  CBC  WITH DIFFERENTIAL/PLATELET - Abnormal; Notable for the following components:   WBC 12.6 (*)    MCV 100.5 (*)    Neutro Abs 9.9 (*)    Abs Immature Granulocytes 0.08 (*)    All other components within normal limits  BASIC METABOLIC PANEL - Abnormal; Notable for the following components:   Sodium 134 (*)    Potassium 5.2 (*)    Glucose, Bld 236 (*)    BUN 38 (*)    Creatinine, Ser 1.26 (*)    GFR, Estimated 43 (*)    All other components within normal limits  URINALYSIS, ROUTINE W REFLEX MICROSCOPIC - Abnormal; Notable for the following components:   Color, Urine STRAW (*)    Glucose, UA 50 (*)    Leukocytes,Ua TRACE (*)    All other components within normal limits  HEPATIC FUNCTION PANEL - Abnormal; Notable for the following components:   Albumin 3.3 (*)    All other components within normal limits  BRAIN NATRIURETIC PEPTIDE - Abnormal; Notable for the following components:   B Natriuretic Peptide 245.0 (*)    All other components within normal limits  GLUCOSE, CAPILLARY - Abnormal; Notable for the following components:   Glucose-Capillary 181 (*)    All other components within  normal limits  GLUCOSE, CAPILLARY - Abnormal; Notable for the following components:   Glucose-Capillary 180 (*)    All other components within normal limits  I-STAT CHEM 8, ED - Abnormal; Notable for the following components:   Sodium 132 (*)    Potassium 5.2 (*)    BUN 47 (*)    Creatinine, Ser 1.20 (*)    Glucose, Bld 235 (*)    Calcium, Ion 0.87 (*)    All other components within normal limits  TROPONIN I (HIGH SENSITIVITY) - Abnormal; Notable for the following components:   Troponin I (High Sensitivity) 21 (*)    All other components within normal limits  CULTURE, BLOOD (ROUTINE X 2)  CULTURE, BLOOD (ROUTINE X 2)  RESPIRATORY PANEL BY PCR  SARS CORONAVIRUS 2 BY RT PCR  ETHANOL  PROTIME-INR  PROCALCITONIN  I-STAT CG4 LACTIC ACID, ED  SAMPLE TO BLOOD BANK  TROPONIN I (HIGH SENSITIVITY)     DG C-Arm 1-60 Min-No Report  Final Result    CT Angio Chest PE W and/or Wo Contrast  Final Result    DG Knee 2 Views Right  Final Result    DG Tibia/Fibula Left  Final Result    DG Pelvis Portable  Final Result    DG Chest Port 1 View  Final Result    CT HEAD WO CONTRAST ( )  Final Result    CT Cervical Spine Wo Contrast  Final Result    DG Tibia/Fibula Left    (Results Pending)  DG Tibia/Fibula Left Port    (Results Pending)    Medications  chlorhexidine (HIBICLENS) 4 % liquid 4 Application (has no administration in time range)  HYDROcodone-acetaminophen (NORCO/VICODIN) 5-325 MG per tablet 1-2 tablet ( Oral MAR Hold 04/27/2023 1307)  morphine (PF) 2 MG/ML injection 0.5 mg ( Intravenous MAR Hold 04/14/2023 1307)  enoxaparin (LOVENOX) injection 40 mg ( Subcutaneous Automatically Held 05/03/23 1200)  senna-docusate (Senokot-S) tablet 1 tablet ( Oral Automatically Held 05/03/23 2200)  acetaminophen (TYLENOL) tablet 650 mg ( Oral MAR Hold 05/05/2023 1307)  colchicine tablet 0.6 mg ( Oral Automatically Held 04/24/2023 1345)  anastrozole (ARIMIDEX) tablet 1 mg ( Oral Automatically Held 05/03/23 1000)  amLODipine (NORVASC) tablet 2.5 mg ( Oral Automatically Held 05/03/23 1000)  carvedilol (COREG) tablet 25 mg ( Oral Automatically Held 05/03/23 1700)  DULoxetine (CYMBALTA) DR capsule 60 mg ( Oral Automatically Held 05/03/23 1000)  calcitRIOL (ROCALTROL) capsule 0.25 mcg ( Oral Automatically Held 05/02/23 0900)  pantoprazole (PROTONIX) EC tablet 40 mg ( Oral Automatically Held 05/03/23 2200)  insulin aspart (novoLOG) injection 0-7 Units (2 Units Subcutaneous Given 04/24/2023 1328)  oxyCODONE (Oxy IR/ROXICODONE) immediate release tablet 5 mg (has no administration in time range)    Or  oxyCODONE (ROXICODONE) 5 MG/5ML solution 5 mg (has no administration in time range)  fentaNYL (SUBLIMAZE) injection 25-50 mcg (has no administration in time range)  ondansetron (ZOFRAN) injection 4 mg  (has no administration in time range)  acetaminophen (TYLENOL) 500 MG tablet (has no administration in time range)  fentaNYL (SUBLIMAZE) injection 50 mcg (50 mcg Intravenous Given 04/27/2023 0501)  lactated ringers bolus 1,000 mL (0 mLs Intravenous Stopped 05/08/2023 0633)  fentaNYL (SUBLIMAZE) injection 50 mcg (50 mcg Intravenous Given 05/08/2023 0602)  iopamidol (ISOVUE-370) 76 % injection 60 mL (60 mLs Intravenous Contrast Given 05/05/2023 0756)  povidone-iodine 10 % swab 2 Application (2 Applications Topical Given 04/29/2023 1312)  ceFAZolin (ANCEF) IVPB 2g/100 mL premix (2 g Intravenous Given 05/03/2023 1426)  fentaNYL (SUBLIMAZE)  injection 50 mcg (50 mcg Intravenous Given 04/27/2023 0959)  chlorhexidine (PERIDEX) 0.12 % solution 15 mL (15 mLs Mouth/Throat Given 05/10/2023 1331)    Or  Oral care mouth rinse ( Mouth Rinse See Alternative 04/16/2023 1331)  acetaminophen (TYLENOL) tablet 1,000 mg (1,000 mg Oral Given 04/22/2023 1334)  carvedilol (COREG) tablet 25 mg (25 mg Oral Given 05/10/2023 1345)  fentaNYL (SUBLIMAZE) 100 MCG/2ML injection (  Override pull for Anesthesia 04/16/2023 1420)     Procedures  /  Critical Care Procedures  ED Course and Medical Decision Making  I have reviewed the triage vital signs, the nursing notes, and pertinent available records from the EMR.  Social Determinants Affecting Complexity of Care: Patient has no clinically significant social determinants affecting this chief complaint..   ED Course: Clinical Course as of 04/22/2023 1535  Wed Apr 25, 2023  0502 Patient made a level 2 trauma for FOT.  She's not sure if she hit her head.  She is on Eliquis.  Since she's uncertain if she hit her head or not, will activate per department protocol. [RB]  0607 O2 sat dropped to upper 80s, patient put on supplemental [place] O2.  She denies SOB or chest pain. [RB]  [phone] ? Fat embolism.  [CC]    Clinical Course User Index [CC] Glyn Ade, MD [RB] Roxy Horseman, PA-C     Medical Decision Making Patient made a level 2 trauma for fall on thinners.  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 Radiology: ordered.  Risk Prescription drug management. Decision regarding hospitalization.         Consultants: I consulted with Dr. Steward Drone, who recommend posterior long leg and stirrup.  Ortho to consult.    Treatment and Plan: Patient signed out to oncoming team.  Will need follow-up on labs and imaging.  Consider CT PE if CXR negative.  Will need admission for hypoxia.     Final Clinical Impressions(s) / ED Diagnoses     ICD-10-CM   1. Closed displaced spiral fracture of shaft of left tibia, initial encounter  S82.242A     2. Hypoxia  R09.02       ED Discharge Orders     None         Discharge Instructions Discussed with and Provided to Patient:   Discharge Instructions   None      Roxy Horseman, PA-C 04/28/2023 1536    Mesner, Barbara Cower, MD 04/22/2023 0730

## 2023-04-25 NOTE — Plan of Care (Signed)
  Problem: Clinical Measurements: Goal: Respiratory complications will improve Outcome: Progressing   Problem: Pain Management: Goal: General experience of comfort will improve Outcome: Progressing   Problem: Safety: Goal: Ability to remain free from injury will improve Outcome: Progressing   Problem: Skin Integrity: Goal: Risk for impaired skin integrity will decrease Outcome: Progressing

## 2023-04-25 NOTE — Anesthesia Procedure Notes (Signed)
Procedure Name: Intubation Date/Time: 04/22/2023 2:19 PM  Performed by: Loleta Aman Batley, CRNAPre-anesthesia Checklist: Patient identified, Patient being monitored, Timeout performed, Emergency Drugs available and Suction available Patient Re-evaluated:Patient Re-evaluated prior to induction Oxygen Delivery Method: Circle system utilized Preoxygenation: Pre-oxygenation with 100% oxygen Induction Type: IV induction Ventilation: Mask ventilation without difficulty Laryngoscope Size: Mac and 3 Grade View: Grade I Tube type: Oral Tube size: 6.5 mm Number of attempts: 1 Airway Equipment and Method: Stylet Placement Confirmation: ETT inserted through vocal cords under direct vision, positive ETCO2 and breath sounds checked- equal and bilateral Secured at: 21 cm Tube secured with: Tape Dental Injury: Teeth and Oropharynx as per pre-operative assessment

## 2023-04-25 NOTE — H&P (View-Only) (Signed)
Reason for Consult:Left tibia fx Referring Physician: Glyn Ade Time called: 0730 Time at bedside: 0854   Sabrina Baker is an 82 y.o. female.  HPI: Sabrina Baker's rollator failed to lock and rolled away from her causing her to fall. She had immediate left leg pain and could not get up. She was brought to the ED where x-rays showed a left tibia fx and orthopedic surgery was consulted. She lives at home alone.  Past Medical History:  Diagnosis Date   Acute diastolic congestive heart failure (HCC)    Breast cancer (HCC)    right breast   Carotid stenosis    Carotid US 1/22: Bilateral ICA 1-39; follow-up as needed   Chronic kidney disease    stage 3   Diabetes mellitus without complication (HCC)    Gout    Hypercholesteremia    Hypertension    Neuromuscular disorder (HCC)    neuropathy   Osteopenia    Retinal detachment    Spinal stenosis    TIA (transient ischemic attack)    Vitamin B12 deficiency     Past Surgical History:  Procedure Laterality Date   25 GAUGE PARS PLANA VITRECTOMY WITH 20 GAUGE MVR PORT Right 03/16/2015   Procedure: 25 GAUGE PARS PLANA VITRECTOMY WITH 23 GAUGE MVR PORT;  Surgeon: Sherrie George, MD;  Location: [facility] OR;  Service: Ophthalmology;  Laterality: Right;   ANKLE SURGERY     BREAST BIOPSY Right 06/28/2022   [place] RT BREAST BX W LOC DEV 1ST LESION IMG BX SPEC US GUIDE 06/28/2022 [facility]   BREAST BIOPSY  08/02/2022   MM RT RADIOACTIVE SEED LOC MAMMO GUIDE 08/02/2022 [facility]   BREAST EXCISIONAL BIOPSY     BREAST LUMPECTOMY     BREAST LUMPECTOMY WITH RADIOACTIVE SEED LOCALIZATION Right 01/24/2016   Procedure: RIGHT BREAST LUMPECTOMY WITH RADIOACTIVE SEED LOCALIZATION;  Surgeon: Abigail Miyamoto, MD;  Location: [facility];  Service: General;  Laterality: Right;   BREAST LUMPECTOMY WITH RADIOACTIVE SEED LOCALIZATION Right 08/03/2022   Procedure: RIGHT BREAST LUMPECTOMY WITH RADIOACTIVE SEED LOCALIZATION;  Surgeon: Abigail Miyamoto, MD;  Location: [facility];  Service: General;  Laterality: Right;  LMA   EYE SURGERY     detached retna X2   HEEL SPUR SURGERY  2012   INCISION AND DRAINAGE ABSCESS Right 11/21/2019   Procedure: INCISION AND DRAINAGE FOREHEAD & RIGHT EAR ABSCESS;  Surgeon: Osborn Coho, MD;  Location: [facility] OR;  Service: ENT;  Laterality: Right;   INJECTION OF SILICONE OIL Right 03/16/2015   Procedure: Extraction OF SILICONE OIL;  Surgeon: Sherrie George, MD;  Location: [facility] OR;  Service: Ophthalmology;  Laterality: Right;   ORIF ANKLE FRACTURE Left 06/17/2014   Procedure: OPEN REDUCTION INTERNAL FIXATION (ORIF) LEFT BIMALLEOLAR ANKLE FRACTURE;  Surgeon: Cheral Almas, MD;  Location: [facility] OR;  Service: Orthopedics;  Laterality: Left;   PHOTOCOAGULATION WITH LASER Right 03/16/2015   Procedure: PHOTOCOAGULATION WITH LASER;  Surgeon: Sherrie George, MD;  Location: [facility] OR;  Service: Ophthalmology;  Laterality: Right;   SHOULDER ARTHROSCOPY Right    plates and screws   VITRECTOMY Right 03/16/2015   WRIST ARTHROPLASTY Left    plates and screws and bone graft    History reviewed. No pertinent family history.  Social History:  reports that she has never smoked. She has been exposed to tobacco smoke. She has never used smokeless tobacco. She reports that she does not drink alcohol and does not use drugs.  Allergies:  Allergies  Allergen Reactions   Ramipril Anaphylaxis and Other (See Comments)   Cortisone Itching and Rash   Niacin Diarrhea    And nausea   Niacin And Related Diarrhea   Valsartan Other (See Comments)    Hyperkalemia     Medications: I have reviewed the patient's current medications.  Results for orders placed or performed during the hospital encounter of 04/27/2023 (from the past 48 hour(s))  Sample to Blood Bank     Status: None   Collection Time: 04/23/2023  4:50 AM  Result Value Ref Range   Blood Bank Specimen SAMPLE AVAILABLE FOR TESTING    Sample Expiration       04/28/2023,2359 Performed at [facility], [address] [place], [place] [address]   Urinalysis, Routine w reflex microscopic -Urine, Clean Catch     Status: Abnormal   Collection Time: 05/07/2023  5:00 AM  Result Value Ref Range   Color, Urine STRAW (A) YELLOW   APPearance CLEAR CLEAR   Specific Gravity, Urine 1.008 1.005 - 1.030   pH 5.0 5.0 - 8.0   Glucose, UA 50 (A) NEGATIVE mg/dL   Hgb urine dipstick NEGATIVE NEGATIVE   Bilirubin Urine NEGATIVE NEGATIVE   Ketones, ur NEGATIVE NEGATIVE mg/dL   Protein, ur NEGATIVE NEGATIVE mg/dL   Nitrite NEGATIVE NEGATIVE   Leukocytes,Ua TRACE (A) NEGATIVE   RBC / HPF 0-5 0 - 5 RBC/hpf   WBC, UA 0-5 0 - 5 WBC/hpf   Bacteria, UA NONE SEEN NONE SEEN   Squamous Epithelial / HPF 0-5 0 - 5 /HPF    Comment: Performed at [facility], [address] [place], [place] [address]  CBC with Differential     Status: Abnormal   Collection Time: 04/15/2023  5:02 AM  Result Value Ref Range   WBC 12.6 (H) 4.0 - 10.5 K/uL   RBC 4.18 3.87 - 5.11 MIL/uL   Hemoglobin 13.4 12.0 - 15.0 g/dL   HCT 82.9 56.2 - 13.0 %   MCV 100.5 (H) 80.0 - 100.0 fL   MCH 32.1 26.0 - 34.0 pg   MCHC 31.9 30.0 - 36.0 g/dL   RDW 86.5 78.4 - 69.6 %   Platelets 238 150 - 400 K/uL   nRBC 0.0 0.0 - 0.2 %   Neutrophils Relative % 79 %   Neutro Abs 9.9 (H) 1.7 - 7.7 K/uL   Lymphocytes Relative 11 %   Lymphs Abs 1.4 0.7 - 4.0 K/uL   Monocytes Relative 7 %   Monocytes Absolute 0.9 0.1 - 1.0 K/uL   Eosinophils Relative 2 %   Eosinophils Absolute 0.2 0.0 - 0.5 K/uL   Basophils Relative 0 %   Basophils Absolute 0.0 0.0 - 0.1 K/uL   Immature Granulocytes 1 %   Abs Immature Granulocytes 0.08 (H) 0.00 - 0.07 K/uL    Comment: Performed at [facility], [address] [place], [place] [address]  Basic metabolic panel     Status: Abnormal   Collection Time: 04/14/2023  5:02 AM  Result Value Ref Range   Sodium 134 (L) 135 - 145 mmol/L   Potassium 5.2 (H) 3.5 - 5.1  mmol/L   Chloride 100 98 - 111 mmol/L   CO2 26 22 - 32 mmol/L   Glucose, Bld 236 (H) 70 - 99 mg/dL    Comment: Glucose reference range applies only to samples taken after fasting for at least 8 hours.   BUN 38 (H) 8 - 23  mg/dL   Creatinine, Ser 8.41 (H) 0.44 - 1.00 mg/dL   Calcium 9.6 8.9 - 66.0 mg/dL   GFR, Estimated 43 (L) >60 mL/min    Comment: (NOTE) Calculated using the CKD-EPI Creatinine Equation (2021)    Anion gap 8 5 - 15    Comment: Performed at [facility], [address], [place], [place] [address]  Protime-INR     Status: None   Collection Time: 05/02/2023  5:02 AM  Result Value Ref Range   Prothrombin Time 13.7 11.4 - 15.2 seconds   INR 1.0 0.8 - 1.2    Comment: (NOTE) INR goal varies based on device and disease states. Performed at [facility], [address] [place], [place] [address]   Hepatic function panel     Status: Abnormal   Collection Time: 04/15/2023  5:02 AM  Result Value Ref Range   Total Protein 6.5 6.5 - 8.1 g/dL   Albumin 3.3 (L) 3.5 - 5.0 g/dL   AST 18 15 - 41 U/L   ALT 14 0 - 44 U/L   Alkaline Phosphatase 65 38 - 126 U/L   Total Bilirubin 0.5 <1.2 mg/dL   Bilirubin, Direct 0.1 0.0 - 0.2 mg/dL   Indirect Bilirubin 0.4 0.3 - 0.9 mg/dL    Comment: Performed at [facility], [address] [place], [place] [address]  Brain natriuretic peptide     Status: Abnormal   Collection Time: 04/13/2023  5:02 AM  Result Value Ref Range   B Natriuretic Peptide 245.0 (H) 0.0 - 100.0 pg/mL    Comment: Performed at [facility], [address], [place], [place] [address]  Troponin I (High Sensitivity)     Status: Abnormal   Collection Time: 05/02/2023  5:02 AM  Result Value Ref Range   Troponin I (High Sensitivity) 21 (H) <18 ng/L    Comment: (NOTE) Elevated high sensitivity troponin I (hsTnI) values and significant  changes across serial measurements may suggest ACS but many other  chronic and acute conditions are known to elevate  hsTnI results.  Refer to the "Links" section for chest pain algorithms and additional  guidance. Performed at [facility], [address] [place], [place] [address]   I-Stat Chem 8, ED     Status: Abnormal   Collection Time: 04/20/2023  5:13 AM  Result Value Ref Range   Sodium 132 (L) 135 - 145 mmol/L   Potassium 5.2 (H) 3.5 - 5.1 mmol/L   Chloride 105 98 - 111 mmol/L   BUN 47 (H) 8 - 23 mg/dL   Creatinine, Ser 6.23 (H) 0.44 - 1.00 mg/dL   Glucose, Bld 762 (H) 70 - 99 mg/dL    Comment: Glucose reference range applies only to samples taken after fasting for at least 8 hours.   Calcium, Ion 0.87 (LL) 1.15 - 1.40 mmol/L   TCO2 27 22 - 32 mmol/L   Hemoglobin 14.3 12.0 - 15.0 g/dL   HCT 83.1 51.7 - 61.6 %   Comment NOTIFIED PHYSICIAN   Ethanol     Status: None   Collection Time: 05/06/2023  5:15 AM  Result Value Ref Range   Alcohol, Ethyl (B) <10 <10 mg/dL    Comment: (NOTE) Lowest detectable limit for serum alcohol is 10 mg/dL.  For medical purposes only. Performed at [facility], [address], [place], [place] [address]   I-Stat Lactic Acid, ED     Status: None  Collection Time: 05/09/2023  5:19 AM  Result Value Ref Range   Lactic Acid, Venous 1.0 0.5 - 1.9 mmol/L    CT Angio Chest PE W and/or Wo Contrast  Result Date: 04/15/2023 CLINICAL DATA:  82 year old female with history of trauma from a fall. Suspected pulmonary embolism. EXAM: CT ANGIOGRAPHY CHEST WITH CONTRAST TECHNIQUE: Multidetector CT imaging of the chest was performed using the standard protocol during bolus administration of intravenous contrast. Multiplanar CT image reconstructions and MIPs were obtained to evaluate the vascular anatomy. RADIATION DOSE REDUCTION: This exam was performed according to the departmental dose-optimization program which includes automated exposure control, adjustment of the mA and/or kV according to patient size and/or use of iterative reconstruction technique. CONTRAST:   60mL ISOVUE-370 IOPAMIDOL (ISOVUE-370) INJECTION 76% COMPARISON:  No priors. FINDINGS: Cardiovascular: No filling defects are noted within the pulmonary arterial tree to suggest pulmonary embolism. Heart size is normal. Trace amount of pericardial fluid and/or thickening, unlikely to be of any hemodynamic significance at this time. No pericardial calcification. There is aortic atherosclerosis, as well as atherosclerosis of the great vessels of the mediastinum and the coronary arteries, including calcified atherosclerotic plaque in the left anterior descending and left circumflex coronary arteries. Calcifications of the aortic valve. Calcifications of the mitral annulus. Mediastinum/Nodes: No pathologically enlarged mediastinal or hilar lymph nodes. Small hiatal hernia. No axillary lymphadenopathy. Lungs/Pleura: Patchy areas of ground-glass attenuation, septal thickening, thickening of the peribronchovascular interstitium and mild cylindrical bronchiectasis scattered throughout the lungs bilaterally, likely areas of chronic post infectious or inflammatory scarring. No confluent consolidative airspace disease. No pleural effusions. No pneumothorax. No definite suspicious appearing pulmonary nodules or masses are noted. Upper Abdomen: Aortic atherosclerosis. Musculoskeletal: Postoperative changes of ORIF in the right proximal humerus partially imaged. There are no aggressive appearing lytic or blastic lesions noted in the visualized portions of the skeleton. Review of the MIP images confirms the above findings. IMPRESSION: 1. No evidence of pulmonary embolism. 2. Trace amount of pericardial fluid and/or thickening, unlikely to be of any hemodynamic significance at this time. No pericardial calcification. 3. Aortic atherosclerosis, in addition to two-vessel coronary artery disease. 4. There are calcifications of the aortic valve and mitral annulus. Echocardiographic correlation for evaluation of potential valvular  dysfunction may be warranted if clinically indicated. 5. The appearance of the lungs suggests areas of chronic post infectious or inflammatory scarring, although the possibility of interstitial lung disease is not excluded. Outpatient referral to Pulmonology for further clinical evaluation is recommended, with consideration for follow-up nonemergent outpatient high-resolution chest CT if clinically appropriate. Aortic Atherosclerosis (ICD10-I70.0). Electronically Signed   By: Trudie Reed M.D.   On: 04/30/2023 08:15   DG Pelvis Portable  Result Date: 04/23/2023 CLINICAL DATA:  Fall injury with polytrauma. EXAM: RIGHT KNEE - 1-2 VIEW; PORTABLE CHEST - 1 VIEW; PORTABLE PELVIS 1-2 VIEWS; LEFT TIBIA AND FIBULA - 2 VIEW COMPARISON:  PA and lateral chest 04/15/2021, AP pelvis 01/23/2022. No prior right knee films. No prior left tibia fibula film. FINDINGS: Chest AP portable 5:37 a.m.: There is mild cardiomegaly without evidence of CHF. The lungs clear aside from chronic left upper lobe linear scarring. There is mild chronic elevation of the right diaphragm. The mediastinum is stable with aortic tortuosity and patchy calcification. No pleural effusion or pneumothorax is evident. There is osteopenia with degenerative changes and slight dextroscoliosis of the thoracic spine. No acute osseous findings. 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 Portable AP pelvis single view: No pelvic fracture or diastasis is seen. There is osteopenia. There is mild arthrosis symmetrically at the hips, SI joints, pubic symphysis. Both proximal femurs are grossly intact AP. There is calcification in both common iliac arteries, right pelvic phleboliths. Right knee, AP Lat only: There is osteopenia without evidence of fracture or dislocation. No focal osseous lesion is suspected.  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 There is a linear calcific body posterior to the joint space. Unclear if this is a fabella or an intra-articular loose body. There is no suprapatellar bursal effusion. Superficial soft tissues are unremarkable. Left tibia fibula series, AP and lateral with multiple films obtained: Osteopenia. There is an acute transverse comminuted and mildly impacted fibular neck fracture, with mild spreading of the fragments and no other displacement of this fracture. There is an acute spiral oblique fracture at about the junction of the mid and distal thirds of the tibial shaft, with 1/2 of a shaft width of lateral displacement of the distal fragment and otherwise with no other significant displacement. There is a hairline nondisplaced oblique fracture in the distal tibia distal to the spiral fracture site. There are no further acute fractures. There are 2 old fracture fixation screws inserted through the medial malleolus, two through the posterior calcaneus. There is also a chronic lateral sideplate fixation of the distal fibular shaft and malleolus. There is moderate secondary arthrosis at the ankle, midfoot collapse partially visible on 1 of the lateral views, and pes planus. There is mild generalized edema in the foreleg and ankle. IMPRESSION: 1. Acute transverse comminuted and mildly impacted fibular neck fracture, with mild spreading of the fragments. 2. Acute spiral oblique fracture, junction of the mid and distal thirds of the tibial shaft, with 1/2 of a shaft width of lateral displacement of the distal fragment. 3. Hairline nondisplaced oblique fracture in the distal tibia distal to the spiral fracture site. 4. Osteopenia and degenerative change without evidence of right knee fracture or dislocation. 5. No acute chest findings. Chronic changes as above. 6. No evidence of pelvic fracture or diastasis. 7. Aortic and iliac atherosclerosis. 8. Old  fracture fixation screws through the medial malleolus, posterior calcaneus, and distal fibular shaft and malleolus. 9. Midfoot collapse partially visible on 1 of the lateral views, and pes planus. Electronically Signed   By: Almira Bar M.D.   On: 05/07/2023 06:34   DG Chest Port 1 View  Result Date: 05/02/2023 CLINICAL DATA:  Fall injury with polytrauma. EXAM: RIGHT KNEE - 1-2 VIEW; PORTABLE CHEST - 1 VIEW; PORTABLE PELVIS 1-2 VIEWS; LEFT TIBIA AND FIBULA - 2 VIEW COMPARISON:  PA and lateral chest 04/15/2021, AP pelvis 01/23/2022. No prior right knee films. No prior left tibia fibula film. FINDINGS: Chest AP portable 5:37 a.m.: There is mild cardiomegaly without evidence of CHF. The lungs clear aside from chronic left upper lobe linear scarring. There is mild chronic elevation of the right diaphragm. The mediastinum is stable with aortic tortuosity and patchy calcification. No pleural effusion or pneumothorax is evident. There is osteopenia with degenerative changes and slight dextroscoliosis of the thoracic spine. No acute osseous findings. 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 Portable AP pelvis single view: No pelvic fracture or diastasis is seen. There is osteopenia. There is  mild arthrosis symmetrically at the hips, SI joints, pubic symphysis. Both proximal femurs are grossly intact AP. There is calcification in both common iliac arteries, right pelvic phleboliths. Right knee, AP Lat only: There is osteopenia without evidence of fracture or dislocation. No focal osseous lesion is suspected. 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 There is a linear calcific body posterior to the joint space. Unclear if this is a fabella or an intra-articular loose body. There is no suprapatellar bursal effusion.  Superficial soft tissues are unremarkable. Left tibia fibula series, AP and lateral with multiple films obtained: Osteopenia. There is an acute transverse comminuted and mildly impacted fibular neck fracture, with mild spreading of the fragments and no other displacement of this fracture. There is an acute spiral oblique fracture at about the junction of the mid and distal thirds of the tibial shaft, with 1/2 of a shaft width of lateral displacement of the distal fragment and otherwise with no other significant displacement. There is a hairline nondisplaced oblique fracture in the distal tibia distal to the spiral fracture site. There are no further acute fractures. There are 2 old fracture fixation screws inserted through the medial malleolus, two through the posterior calcaneus. There is also a chronic lateral sideplate fixation of the distal fibular shaft and malleolus. There is moderate secondary arthrosis at the ankle, midfoot collapse partially visible on 1 of the lateral views, and pes planus. There is mild generalized edema in the foreleg and ankle. IMPRESSION: 1. Acute transverse comminuted and mildly impacted fibular neck fracture, with mild spreading of the fragments. 2. Acute spiral oblique fracture, junction of the mid and distal thirds of the tibial shaft, with 1/2 of a shaft width of lateral displacement of the distal fragment. 3. Hairline nondisplaced oblique fracture in the distal tibia distal to the spiral fracture site. 4. Osteopenia and degenerative change without evidence of right knee fracture or dislocation. 5. No acute chest findings. Chronic changes as above. 6. No evidence of pelvic fracture or diastasis. 7. Aortic and iliac atherosclerosis. 8. Old fracture fixation screws through the medial malleolus, posterior calcaneus, and distal fibular shaft and malleolus. 9. Midfoot collapse partially visible on 1 of the lateral views, and pes planus. Electronically Signed   By: Almira Bar M.D.    On: 04/30/2023 06:34   DG Knee 2 Views Right  Result Date: 04/30/2023 CLINICAL DATA:  Fall injury with polytrauma. EXAM: RIGHT KNEE - 1-2 VIEW; PORTABLE CHEST - 1 VIEW; PORTABLE PELVIS 1-2 VIEWS; LEFT TIBIA AND FIBULA - 2 VIEW COMPARISON:  PA and lateral chest 04/15/2021, AP pelvis 01/23/2022. No prior right knee films. No prior left tibia fibula film. FINDINGS: Chest AP portable 5:37 a.m.: There is mild cardiomegaly without evidence of CHF. The lungs clear aside from chronic left upper lobe linear scarring. There is mild chronic elevation of the right diaphragm. The mediastinum is stable with aortic tortuosity and patchy calcification. No pleural effusion or pneumothorax is evident. There is osteopenia with degenerative changes and slight dextroscoliosis of the thoracic spine. No acute osseous findings. 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 Portable AP pelvis single view: No pelvic fracture or diastasis is seen. There is osteopenia. There is mild arthrosis symmetrically at the hips, SI joints, pubic symphysis. Both proximal femurs are grossly intact AP. There is calcification in both common iliac arteries, right pelvic phleboliths.  Right knee, AP Lat only: There is osteopenia without evidence of fracture or dislocation. No focal osseous lesion is suspected. 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 There is a linear calcific body posterior to the joint space. Unclear if this is a fabella or an intra-articular loose body. There is no suprapatellar bursal effusion. Superficial soft tissues are unremarkable. Left tibia fibula series, AP and lateral with multiple films obtained: Osteopenia. There is an acute transverse comminuted and mildly impacted fibular neck fracture, with mild spreading of the fragments and no  other displacement of this fracture. There is an acute spiral oblique fracture at about the junction of the mid and distal thirds of the tibial shaft, with 1/2 of a shaft width of lateral displacement of the distal fragment and otherwise with no other significant displacement. There is a hairline nondisplaced oblique fracture in the distal tibia distal to the spiral fracture site. There are no further acute fractures. There are 2 old fracture fixation screws inserted through the medial malleolus, two through the posterior calcaneus. There is also a chronic lateral sideplate fixation of the distal fibular shaft and malleolus. There is moderate secondary arthrosis at the ankle, midfoot collapse partially visible on 1 of the lateral views, and pes planus. There is mild generalized edema in the foreleg and ankle. IMPRESSION: 1. Acute transverse comminuted and mildly impacted fibular neck fracture, with mild spreading of the fragments. 2. Acute spiral oblique fracture, junction of the mid and distal thirds of the tibial shaft, with 1/2 of a shaft width of lateral displacement of the distal fragment. 3. Hairline nondisplaced oblique fracture in the distal tibia distal to the spiral fracture site. 4. Osteopenia and degenerative change without evidence of right knee fracture or dislocation. 5. No acute chest findings. Chronic changes as above. 6. No evidence of pelvic fracture or diastasis. 7. Aortic and iliac atherosclerosis. 8. Old fracture fixation screws through the medial malleolus, posterior calcaneus, and distal fibular shaft and malleolus. 9. Midfoot collapse partially visible on 1 of the lateral views, and pes planus. Electronically Signed   By: Almira Bar M.D.   On: 04/23/2023 06:34   DG Tibia/Fibula Left  Result Date: 04/20/2023 CLINICAL DATA:  Fall injury with polytrauma. EXAM: RIGHT KNEE - 1-2 VIEW; PORTABLE CHEST - 1 VIEW; PORTABLE PELVIS 1-2 VIEWS; LEFT TIBIA AND FIBULA - 2 VIEW COMPARISON:  PA and  lateral chest 04/15/2021, AP pelvis 01/23/2022. No prior right knee films. No prior left tibia fibula film. FINDINGS: Chest AP portable 5:37 a.m.: There is mild cardiomegaly without evidence of CHF. The lungs clear aside from chronic left upper lobe linear scarring. There is mild chronic elevation of the right diaphragm. The mediastinum is stable with aortic tortuosity and patchy calcification. No pleural effusion or pneumothorax is evident. There is osteopenia with degenerative changes and slight dextroscoliosis of the thoracic spine. No acute osseous findings. 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 Portable AP pelvis single view: No pelvic fracture or diastasis is seen. There is osteopenia. There is mild arthrosis symmetrically at the hips, SI joints, pubic symphysis. Both proximal femurs are grossly intact AP. There is calcification in both common iliac arteries, right pelvic phleboliths. Right knee, AP Lat only: There is osteopenia without evidence of fracture or dislocation. No focal osseous lesion is suspected. 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 There  is a linear calcific body posterior to the joint space. Unclear if this is a fabella or an intra-articular loose body. There is no suprapatellar bursal effusion. Superficial soft tissues are unremarkable. Left tibia fibula series, AP and lateral with multiple films obtained: Osteopenia. There is an acute transverse comminuted and mildly impacted fibular neck fracture, with mild spreading of the fragments and no other displacement of this fracture. There is an acute spiral oblique fracture at about the junction of the mid and distal thirds of the tibial shaft, with 1/2 of a shaft width of lateral displacement of the distal fragment and otherwise with no other  significant displacement. There is a hairline nondisplaced oblique fracture in the distal tibia distal to the spiral fracture site. There are no further acute fractures. There are 2 old fracture fixation screws inserted through the medial malleolus, two through the posterior calcaneus. There is also a chronic lateral sideplate fixation of the distal fibular shaft and malleolus. There is moderate secondary arthrosis at the ankle, midfoot collapse partially visible on 1 of the lateral views, and pes planus. There is mild generalized edema in the foreleg and ankle. IMPRESSION: 1. Acute transverse comminuted and mildly impacted fibular neck fracture, with mild spreading of the fragments. 2. Acute spiral oblique fracture, junction of the mid and distal thirds of the tibial shaft, with 1/2 of a shaft width of lateral displacement of the distal fragment. 3. Hairline nondisplaced oblique fracture in the distal tibia distal to the spiral fracture site. 4. Osteopenia and degenerative change without evidence of right knee fracture or dislocation. 5. No acute chest findings. Chronic changes as above. 6. No evidence of pelvic fracture or diastasis. 7. Aortic and iliac atherosclerosis. 8. Old fracture fixation screws through the medial malleolus, posterior calcaneus, and distal fibular shaft and malleolus. 9. Midfoot collapse partially visible on 1 of the lateral views, and pes planus. Electronically Signed   By: Almira Bar M.D.   On: 05/04/2023 06:34   CT HEAD WO CONTRAST ( )  Result Date: 05/03/2023 CLINICAL DATA:  Fall at home.  Blunt poly trauma EXAM: CT HEAD WITHOUT CONTRAST CT CERVICAL SPINE WITHOUT CONTRAST TECHNIQUE: Multidetector CT imaging of the head and cervical spine was performed following the standard protocol without intravenous contrast. Multiplanar CT image reconstructions of the cervical spine were also generated. RADIATION DOSE REDUCTION: This exam was performed according to the departmental  dose-optimization program which includes automated exposure control, adjustment of the mA and/or kV according to patient size and/or use of iterative reconstruction technique. COMPARISON:  Brain MRI 01/07/2022 FINDINGS: CT HEAD FINDINGS Brain: No evidence of acute infarction, hemorrhage, hydrocephalus, extra-axial collection or mass lesion/mass effect. Cerebral volume loss and chronic small vessel ischemia that is mild for age. Vascular: No hyperdense vessel or unexpected calcification. Skull: No acute finding Sinuses/Orbits: No evidence of injury.  Right scleral band. CT CERVICAL SPINE FINDINGS Alignment: No traumatic malalignment.  Mild scoliosis Skull base and vertebrae: No acute fracture. No primary bone lesion or focal pathologic process. Soft tissues and spinal canal: No prevertebral fluid or swelling. No visible canal hematoma. Disc levels: Advanced and generalized disc and facet degeneration with diffuse spurring and disc collapse. Endplate sclerosis at multiple levels. 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 Upper chest: No visible injury IMPRESSION: No evidence of acute intracranial or cervical spine injury. Electronically Signed   By: Tiburcio Pea M.D.   On: 04/28/2023 05:42   CT Cervical Spine Wo Contrast  Result Date: 04/22/2023 CLINICAL DATA:  Fall at home.  Blunt poly trauma EXAM: CT HEAD WITHOUT CONTRAST CT CERVICAL SPINE WITHOUT CONTRAST TECHNIQUE: Multidetector CT imaging of the head and cervical spine was performed following the standard protocol without intravenous contrast. Multiplanar CT image reconstructions of the cervical spine were also generated. RADIATION DOSE REDUCTION: This exam was performed according to the departmental dose-optimization program which includes automated exposure control, adjustment of the mA and/or kV according to patient size and/or use of iterative reconstruction technique. COMPARISON:  Brain MRI 01/07/2022 FINDINGS: CT HEAD FINDINGS Brain: No evidence of  acute infarction, hemorrhage, hydrocephalus, extra-axial collection or mass lesion/mass effect. Cerebral volume loss and chronic small vessel ischemia that is mild for age. Vascular: No hyperdense vessel or unexpected calcification. Skull: No acute finding Sinuses/Orbits: No evidence of injury.  Right scleral band. CT CERVICAL SPINE FINDINGS Alignment: No traumatic malalignment.  Mild scoliosis Skull base and vertebrae: No acute fracture. No primary bone lesion or focal pathologic process. Soft tissues and spinal canal: No prevertebral fluid or swelling. No visible canal hematoma. Disc levels: Advanced and generalized disc and facet degeneration with diffuse spurring and disc collapse. Endplate sclerosis at multiple levels. 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 Upper chest: No visible injury IMPRESSION: No evidence of acute intracranial or cervical spine injury. Electronically Signed   By: Tiburcio Pea M.D.   On: 04/18/2023 05:42    Review of Systems  HENT:  Negative for ear discharge, ear pain, hearing loss and tinnitus.   Eyes:  Negative for photophobia and pain.  Respiratory:  Negative for cough and shortness of breath.   Cardiovascular:  Negative for chest pain.  Gastrointestinal:  Negative for abdominal pain, nausea and vomiting.  Genitourinary:  Negative for dysuria, flank pain, frequency and urgency.  Musculoskeletal:  Positive for arthralgias (Left lower leg). Negative for back pain, myalgias and neck pain.  Neurological:  Negative for dizziness and headaches.  Hematological:  Does not bruise/bleed easily.  Psychiatric/Behavioral:  The patient is not nervous/anxious.    Blood pressure (!) 149/76, pulse 71, temperature 99.1 F (37.3 C), resp. rate (!) 21, SpO2 94%. Physical Exam Constitutional:      General: She is not in acute distress.    Appearance: She is well-developed. She is not diaphoretic.  HENT:     Head: Normocephalic and atraumatic.  Eyes:     General: No scleral icterus.        Right eye: No discharge.        Left eye: No discharge.     Conjunctiva/sclera: Conjunctivae normal.  Cardiovascular:     Rate and Rhythm: Normal rate and regular rhythm.  Pulmonary:     Effort: Pulmonary effort is normal. No respiratory distress.  Musculoskeletal:     Cervical back: Normal range of motion.     Comments: LLE No traumatic wounds, ecchymosis, or rash  Short leg splint in place  No knee effusion  Knee stable to varus/ valgus and anterior/posterior stress  Sens DPN, SPN, TN intact  Motor EHL 5/5  Toes perfused, No significant edema  Skin:    General: Skin is warm and dry.  Neurological:     Mental Status: She is alert.  Psychiatric:        Mood and Affect: Mood normal.        Behavior: Behavior normal.     Assessment/Plan: Left tibia fx -- Plan IMN today with Dr. Jena Gauss. Please keep NPO.    Freeman Caldron, PA-C Orthopedic Surgery [phone] 05/09/2023, 9:00 AM

## 2023-04-25 NOTE — Consult Note (Signed)
Reason for Consult:Left tibia fx Referring Physician: Glyn Ade Time called: 0730 Time at bedside: 0854   Sabrina Baker is an 82 y.o. female.  HPI: Sabrina Baker's rollator failed to lock and rolled away from her causing her to fall. She had immediate left leg pain and could not get up. She was brought to the ED where x-rays showed a left tibia fx and orthopedic surgery was consulted. She lives at home alone.  Past Medical History:  Diagnosis Date   Acute diastolic congestive heart failure (HCC)    Breast cancer (HCC)    right breast   Carotid stenosis    Carotid US 1/22: Bilateral ICA 1-39; follow-up as needed   Chronic kidney disease    stage 3   Diabetes mellitus without complication (HCC)    Gout    Hypercholesteremia    Hypertension    Neuromuscular disorder (HCC)    neuropathy   Osteopenia    Retinal detachment    Spinal stenosis    TIA (transient ischemic attack)    Vitamin B12 deficiency     Past Surgical History:  Procedure Laterality Date   25 GAUGE PARS PLANA VITRECTOMY WITH 20 GAUGE MVR PORT Right 03/16/2015   Procedure: 25 GAUGE PARS PLANA VITRECTOMY WITH 23 GAUGE MVR PORT;  Surgeon: Sherrie George, MD;  Location: [facility] OR;  Service: Ophthalmology;  Laterality: Right;   ANKLE SURGERY     BREAST BIOPSY Right 06/28/2022   [place] RT BREAST BX W LOC DEV 1ST LESION IMG BX SPEC US GUIDE 06/28/2022 [facility]   BREAST BIOPSY  08/02/2022   MM RT RADIOACTIVE SEED LOC MAMMO GUIDE 08/02/2022 [facility]   BREAST EXCISIONAL BIOPSY     BREAST LUMPECTOMY     BREAST LUMPECTOMY WITH RADIOACTIVE SEED LOCALIZATION Right 01/24/2016   Procedure: RIGHT BREAST LUMPECTOMY WITH RADIOACTIVE SEED LOCALIZATION;  Surgeon: Abigail Miyamoto, MD;  Location: [facility];  Service: General;  Laterality: Right;   BREAST LUMPECTOMY WITH RADIOACTIVE SEED LOCALIZATION Right 08/03/2022   Procedure: RIGHT BREAST LUMPECTOMY WITH RADIOACTIVE SEED LOCALIZATION;  Surgeon: Abigail Miyamoto, MD;  Location: [facility];  Service: General;  Laterality: Right;  LMA   EYE SURGERY     detached retna X2   HEEL SPUR SURGERY  2012   INCISION AND DRAINAGE ABSCESS Right 11/21/2019   Procedure: INCISION AND DRAINAGE FOREHEAD & RIGHT EAR ABSCESS;  Surgeon: Osborn Coho, MD;  Location: [facility] OR;  Service: ENT;  Laterality: Right;   INJECTION OF SILICONE OIL Right 03/16/2015   Procedure: Extraction OF SILICONE OIL;  Surgeon: Sherrie George, MD;  Location: [facility] OR;  Service: Ophthalmology;  Laterality: Right;   ORIF ANKLE FRACTURE Left 06/17/2014   Procedure: OPEN REDUCTION INTERNAL FIXATION (ORIF) LEFT BIMALLEOLAR ANKLE FRACTURE;  Surgeon: Cheral Almas, MD;  Location: [facility] OR;  Service: Orthopedics;  Laterality: Left;   PHOTOCOAGULATION WITH LASER Right 03/16/2015   Procedure: PHOTOCOAGULATION WITH LASER;  Surgeon: Sherrie George, MD;  Location: [facility] OR;  Service: Ophthalmology;  Laterality: Right;   SHOULDER ARTHROSCOPY Right    plates and screws   VITRECTOMY Right 03/16/2015   WRIST ARTHROPLASTY Left    plates and screws and bone graft    History reviewed. No pertinent family history.  Social History:  reports that she has never smoked. She has been exposed to tobacco smoke. She has never used smokeless tobacco. She reports that she does not drink alcohol and does not use drugs.  Allergies:  Allergies  Allergen Reactions   Ramipril Anaphylaxis and Other (See Comments)   Cortisone Itching and Rash   Niacin Diarrhea    And nausea   Niacin And Related Diarrhea   Valsartan Other (See Comments)    Hyperkalemia     Medications: I have reviewed the patient's current medications.  Results for orders placed or performed during the hospital encounter of 04/27/2023 (from the past 48 hour(s))  Sample to Blood Bank     Status: None   Collection Time: 04/23/2023  4:50 AM  Result Value Ref Range   Blood Bank Specimen SAMPLE AVAILABLE FOR TESTING    Sample Expiration       04/28/2023,2359 Performed at [facility], [address] [place], [place] [address]   Urinalysis, Routine w reflex microscopic -Urine, Clean Catch     Status: Abnormal   Collection Time: 05/07/2023  5:00 AM  Result Value Ref Range   Color, Urine STRAW (A) YELLOW   APPearance CLEAR CLEAR   Specific Gravity, Urine 1.008 1.005 - 1.030   pH 5.0 5.0 - 8.0   Glucose, UA 50 (A) NEGATIVE mg/dL   Hgb urine dipstick NEGATIVE NEGATIVE   Bilirubin Urine NEGATIVE NEGATIVE   Ketones, ur NEGATIVE NEGATIVE mg/dL   Protein, ur NEGATIVE NEGATIVE mg/dL   Nitrite NEGATIVE NEGATIVE   Leukocytes,Ua TRACE (A) NEGATIVE   RBC / HPF 0-5 0 - 5 RBC/hpf   WBC, UA 0-5 0 - 5 WBC/hpf   Bacteria, UA NONE SEEN NONE SEEN   Squamous Epithelial / HPF 0-5 0 - 5 /HPF    Comment: Performed at [facility], [address] [place], [place] [address]  CBC with Differential     Status: Abnormal   Collection Time: 04/15/2023  5:02 AM  Result Value Ref Range   WBC 12.6 (H) 4.0 - 10.5 K/uL   RBC 4.18 3.87 - 5.11 MIL/uL   Hemoglobin 13.4 12.0 - 15.0 g/dL   HCT 82.9 56.2 - 13.0 %   MCV 100.5 (H) 80.0 - 100.0 fL   MCH 32.1 26.0 - 34.0 pg   MCHC 31.9 30.0 - 36.0 g/dL   RDW 86.5 78.4 - 69.6 %   Platelets 238 150 - 400 K/uL   nRBC 0.0 0.0 - 0.2 %   Neutrophils Relative % 79 %   Neutro Abs 9.9 (H) 1.7 - 7.7 K/uL   Lymphocytes Relative 11 %   Lymphs Abs 1.4 0.7 - 4.0 K/uL   Monocytes Relative 7 %   Monocytes Absolute 0.9 0.1 - 1.0 K/uL   Eosinophils Relative 2 %   Eosinophils Absolute 0.2 0.0 - 0.5 K/uL   Basophils Relative 0 %   Basophils Absolute 0.0 0.0 - 0.1 K/uL   Immature Granulocytes 1 %   Abs Immature Granulocytes 0.08 (H) 0.00 - 0.07 K/uL    Comment: Performed at [facility], [address] [place], [place] [address]  Basic metabolic panel     Status: Abnormal   Collection Time: 04/14/2023  5:02 AM  Result Value Ref Range   Sodium 134 (L) 135 - 145 mmol/L   Potassium 5.2 (H) 3.5 - 5.1  mmol/L   Chloride 100 98 - 111 mmol/L   CO2 26 22 - 32 mmol/L   Glucose, Bld 236 (H) 70 - 99 mg/dL    Comment: Glucose reference range applies only to samples taken after fasting for at least 8 hours.   BUN 38 (H) 8 - 23  mg/dL   Creatinine, Ser 8.41 (H) 0.44 - 1.00 mg/dL   Calcium 9.6 8.9 - 66.0 mg/dL   GFR, Estimated 43 (L) >60 mL/min    Comment: (NOTE) Calculated using the CKD-EPI Creatinine Equation (2021)    Anion gap 8 5 - 15    Comment: Performed at [facility], [address], [place], [place] [address]  Protime-INR     Status: None   Collection Time: 05/02/2023  5:02 AM  Result Value Ref Range   Prothrombin Time 13.7 11.4 - 15.2 seconds   INR 1.0 0.8 - 1.2    Comment: (NOTE) INR goal varies based on device and disease states. Performed at [facility], [address] [place], [place] [address]   Hepatic function panel     Status: Abnormal   Collection Time: 04/15/2023  5:02 AM  Result Value Ref Range   Total Protein 6.5 6.5 - 8.1 g/dL   Albumin 3.3 (L) 3.5 - 5.0 g/dL   AST 18 15 - 41 U/L   ALT 14 0 - 44 U/L   Alkaline Phosphatase 65 38 - 126 U/L   Total Bilirubin 0.5 <1.2 mg/dL   Bilirubin, Direct 0.1 0.0 - 0.2 mg/dL   Indirect Bilirubin 0.4 0.3 - 0.9 mg/dL    Comment: Performed at [facility], [address] [place], [place] [address]  Brain natriuretic peptide     Status: Abnormal   Collection Time: 04/13/2023  5:02 AM  Result Value Ref Range   B Natriuretic Peptide 245.0 (H) 0.0 - 100.0 pg/mL    Comment: Performed at [facility], [address], [place], [place] [address]  Troponin I (High Sensitivity)     Status: Abnormal   Collection Time: 05/02/2023  5:02 AM  Result Value Ref Range   Troponin I (High Sensitivity) 21 (H) <18 ng/L    Comment: (NOTE) Elevated high sensitivity troponin I (hsTnI) values and significant  changes across serial measurements may suggest ACS but many other  chronic and acute conditions are known to elevate  hsTnI results.  Refer to the "Links" section for chest pain algorithms and additional  guidance. Performed at [facility], [address] [place], [place] [address]   I-Stat Chem 8, ED     Status: Abnormal   Collection Time: 04/20/2023  5:13 AM  Result Value Ref Range   Sodium 132 (L) 135 - 145 mmol/L   Potassium 5.2 (H) 3.5 - 5.1 mmol/L   Chloride 105 98 - 111 mmol/L   BUN 47 (H) 8 - 23 mg/dL   Creatinine, Ser 6.23 (H) 0.44 - 1.00 mg/dL   Glucose, Bld 762 (H) 70 - 99 mg/dL    Comment: Glucose reference range applies only to samples taken after fasting for at least 8 hours.   Calcium, Ion 0.87 (LL) 1.15 - 1.40 mmol/L   TCO2 27 22 - 32 mmol/L   Hemoglobin 14.3 12.0 - 15.0 g/dL   HCT 83.1 51.7 - 61.6 %   Comment NOTIFIED PHYSICIAN   Ethanol     Status: None   Collection Time: 05/06/2023  5:15 AM  Result Value Ref Range   Alcohol, Ethyl (B) <10 <10 mg/dL    Comment: (NOTE) Lowest detectable limit for serum alcohol is 10 mg/dL.  For medical purposes only. Performed at [facility], [address], [place], [place] [address]   I-Stat Lactic Acid, ED     Status: None  Collection Time: 05/09/2023  5:19 AM  Result Value Ref Range   Lactic Acid, Venous 1.0 0.5 - 1.9 mmol/L    CT Angio Chest PE W and/or Wo Contrast  Result Date: 04/15/2023 CLINICAL DATA:  82 year old female with history of trauma from a fall. Suspected pulmonary embolism. EXAM: CT ANGIOGRAPHY CHEST WITH CONTRAST TECHNIQUE: Multidetector CT imaging of the chest was performed using the standard protocol during bolus administration of intravenous contrast. Multiplanar CT image reconstructions and MIPs were obtained to evaluate the vascular anatomy. RADIATION DOSE REDUCTION: This exam was performed according to the departmental dose-optimization program which includes automated exposure control, adjustment of the mA and/or kV according to patient size and/or use of iterative reconstruction technique. CONTRAST:   60mL ISOVUE-370 IOPAMIDOL (ISOVUE-370) INJECTION 76% COMPARISON:  No priors. FINDINGS: Cardiovascular: No filling defects are noted within the pulmonary arterial tree to suggest pulmonary embolism. Heart size is normal. Trace amount of pericardial fluid and/or thickening, unlikely to be of any hemodynamic significance at this time. No pericardial calcification. There is aortic atherosclerosis, as well as atherosclerosis of the great vessels of the mediastinum and the coronary arteries, including calcified atherosclerotic plaque in the left anterior descending and left circumflex coronary arteries. Calcifications of the aortic valve. Calcifications of the mitral annulus. Mediastinum/Nodes: No pathologically enlarged mediastinal or hilar lymph nodes. Small hiatal hernia. No axillary lymphadenopathy. Lungs/Pleura: Patchy areas of ground-glass attenuation, septal thickening, thickening of the peribronchovascular interstitium and mild cylindrical bronchiectasis scattered throughout the lungs bilaterally, likely areas of chronic post infectious or inflammatory scarring. No confluent consolidative airspace disease. No pleural effusions. No pneumothorax. No definite suspicious appearing pulmonary nodules or masses are noted. Upper Abdomen: Aortic atherosclerosis. Musculoskeletal: Postoperative changes of ORIF in the right proximal humerus partially imaged. There are no aggressive appearing lytic or blastic lesions noted in the visualized portions of the skeleton. Review of the MIP images confirms the above findings. IMPRESSION: 1. No evidence of pulmonary embolism. 2. Trace amount of pericardial fluid and/or thickening, unlikely to be of any hemodynamic significance at this time. No pericardial calcification. 3. Aortic atherosclerosis, in addition to two-vessel coronary artery disease. 4. There are calcifications of the aortic valve and mitral annulus. Echocardiographic correlation for evaluation of potential valvular  dysfunction may be warranted if clinically indicated. 5. The appearance of the lungs suggests areas of chronic post infectious or inflammatory scarring, although the possibility of interstitial lung disease is not excluded. Outpatient referral to Pulmonology for further clinical evaluation is recommended, with consideration for follow-up nonemergent outpatient high-resolution chest CT if clinically appropriate. Aortic Atherosclerosis (ICD10-I70.0). Electronically Signed   By: Trudie Reed M.D.   On: 04/30/2023 08:15   DG Pelvis Portable  Result Date: 04/23/2023 CLINICAL DATA:  Fall injury with polytrauma. EXAM: RIGHT KNEE - 1-2 VIEW; PORTABLE CHEST - 1 VIEW; PORTABLE PELVIS 1-2 VIEWS; LEFT TIBIA AND FIBULA - 2 VIEW COMPARISON:  PA and lateral chest 04/15/2021, AP pelvis 01/23/2022. No prior right knee films. No prior left tibia fibula film. FINDINGS: Chest AP portable 5:37 a.m.: There is mild cardiomegaly without evidence of CHF. The lungs clear aside from chronic left upper lobe linear scarring. There is mild chronic elevation of the right diaphragm. The mediastinum is stable with aortic tortuosity and patchy calcification. No pleural effusion or pneumothorax is evident. There is osteopenia with degenerative changes and slight dextroscoliosis of the thoracic spine. No acute osseous findings. 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 Portable AP pelvis single view: No pelvic fracture or diastasis is seen. There is osteopenia. There is mild arthrosis symmetrically at the hips, SI joints, pubic symphysis. Both proximal femurs are grossly intact AP. There is calcification in both common iliac arteries, right pelvic phleboliths. Right knee, AP Lat only: There is osteopenia without evidence of fracture or dislocation. No focal osseous lesion is suspected.  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 There is a linear calcific body posterior to the joint space. Unclear if this is a fabella or an intra-articular loose body. There is no suprapatellar bursal effusion. Superficial soft tissues are unremarkable. Left tibia fibula series, AP and lateral with multiple films obtained: Osteopenia. There is an acute transverse comminuted and mildly impacted fibular neck fracture, with mild spreading of the fragments and no other displacement of this fracture. There is an acute spiral oblique fracture at about the junction of the mid and distal thirds of the tibial shaft, with 1/2 of a shaft width of lateral displacement of the distal fragment and otherwise with no other significant displacement. There is a hairline nondisplaced oblique fracture in the distal tibia distal to the spiral fracture site. There are no further acute fractures. There are 2 old fracture fixation screws inserted through the medial malleolus, two through the posterior calcaneus. There is also a chronic lateral sideplate fixation of the distal fibular shaft and malleolus. There is moderate secondary arthrosis at the ankle, midfoot collapse partially visible on 1 of the lateral views, and pes planus. There is mild generalized edema in the foreleg and ankle. IMPRESSION: 1. Acute transverse comminuted and mildly impacted fibular neck fracture, with mild spreading of the fragments. 2. Acute spiral oblique fracture, junction of the mid and distal thirds of the tibial shaft, with 1/2 of a shaft width of lateral displacement of the distal fragment. 3. Hairline nondisplaced oblique fracture in the distal tibia distal to the spiral fracture site. 4. Osteopenia and degenerative change without evidence of right knee fracture or dislocation. 5. No acute chest findings. Chronic changes as above. 6. No evidence of pelvic fracture or diastasis. 7. Aortic and iliac atherosclerosis. 8. Old  fracture fixation screws through the medial malleolus, posterior calcaneus, and distal fibular shaft and malleolus. 9. Midfoot collapse partially visible on 1 of the lateral views, and pes planus. Electronically Signed   By: Almira Bar M.D.   On: 05/07/2023 06:34   DG Chest Port 1 View  Result Date: 05/02/2023 CLINICAL DATA:  Fall injury with polytrauma. EXAM: RIGHT KNEE - 1-2 VIEW; PORTABLE CHEST - 1 VIEW; PORTABLE PELVIS 1-2 VIEWS; LEFT TIBIA AND FIBULA - 2 VIEW COMPARISON:  PA and lateral chest 04/15/2021, AP pelvis 01/23/2022. No prior right knee films. No prior left tibia fibula film. FINDINGS: Chest AP portable 5:37 a.m.: There is mild cardiomegaly without evidence of CHF. The lungs clear aside from chronic left upper lobe linear scarring. There is mild chronic elevation of the right diaphragm. The mediastinum is stable with aortic tortuosity and patchy calcification. No pleural effusion or pneumothorax is evident. There is osteopenia with degenerative changes and slight dextroscoliosis of the thoracic spine. No acute osseous findings. 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 Portable AP pelvis single view: No pelvic fracture or diastasis is seen. There is osteopenia. There is  mild arthrosis symmetrically at the hips, SI joints, pubic symphysis. Both proximal femurs are grossly intact AP. There is calcification in both common iliac arteries, right pelvic phleboliths. Right knee, AP Lat only: There is osteopenia without evidence of fracture or dislocation. No focal osseous lesion is suspected. 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 There is a linear calcific body posterior to the joint space. Unclear if this is a fabella or an intra-articular loose body. There is no suprapatellar bursal effusion.  Superficial soft tissues are unremarkable. Left tibia fibula series, AP and lateral with multiple films obtained: Osteopenia. There is an acute transverse comminuted and mildly impacted fibular neck fracture, with mild spreading of the fragments and no other displacement of this fracture. There is an acute spiral oblique fracture at about the junction of the mid and distal thirds of the tibial shaft, with 1/2 of a shaft width of lateral displacement of the distal fragment and otherwise with no other significant displacement. There is a hairline nondisplaced oblique fracture in the distal tibia distal to the spiral fracture site. There are no further acute fractures. There are 2 old fracture fixation screws inserted through the medial malleolus, two through the posterior calcaneus. There is also a chronic lateral sideplate fixation of the distal fibular shaft and malleolus. There is moderate secondary arthrosis at the ankle, midfoot collapse partially visible on 1 of the lateral views, and pes planus. There is mild generalized edema in the foreleg and ankle. IMPRESSION: 1. Acute transverse comminuted and mildly impacted fibular neck fracture, with mild spreading of the fragments. 2. Acute spiral oblique fracture, junction of the mid and distal thirds of the tibial shaft, with 1/2 of a shaft width of lateral displacement of the distal fragment. 3. Hairline nondisplaced oblique fracture in the distal tibia distal to the spiral fracture site. 4. Osteopenia and degenerative change without evidence of right knee fracture or dislocation. 5. No acute chest findings. Chronic changes as above. 6. No evidence of pelvic fracture or diastasis. 7. Aortic and iliac atherosclerosis. 8. Old fracture fixation screws through the medial malleolus, posterior calcaneus, and distal fibular shaft and malleolus. 9. Midfoot collapse partially visible on 1 of the lateral views, and pes planus. Electronically Signed   By: Almira Bar M.D.    On: 04/30/2023 06:34   DG Knee 2 Views Right  Result Date: 04/30/2023 CLINICAL DATA:  Fall injury with polytrauma. EXAM: RIGHT KNEE - 1-2 VIEW; PORTABLE CHEST - 1 VIEW; PORTABLE PELVIS 1-2 VIEWS; LEFT TIBIA AND FIBULA - 2 VIEW COMPARISON:  PA and lateral chest 04/15/2021, AP pelvis 01/23/2022. No prior right knee films. No prior left tibia fibula film. FINDINGS: Chest AP portable 5:37 a.m.: There is mild cardiomegaly without evidence of CHF. The lungs clear aside from chronic left upper lobe linear scarring. There is mild chronic elevation of the right diaphragm. The mediastinum is stable with aortic tortuosity and patchy calcification. No pleural effusion or pneumothorax is evident. There is osteopenia with degenerative changes and slight dextroscoliosis of the thoracic spine. No acute osseous findings. 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 Portable AP pelvis single view: No pelvic fracture or diastasis is seen. There is osteopenia. There is mild arthrosis symmetrically at the hips, SI joints, pubic symphysis. Both proximal femurs are grossly intact AP. There is calcification in both common iliac arteries, right pelvic phleboliths.  Right knee, AP Lat only: There is osteopenia without evidence of fracture or dislocation. No focal osseous lesion is suspected. 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 There is a linear calcific body posterior to the joint space. Unclear if this is a fabella or an intra-articular loose body. There is no suprapatellar bursal effusion. Superficial soft tissues are unremarkable. Left tibia fibula series, AP and lateral with multiple films obtained: Osteopenia. There is an acute transverse comminuted and mildly impacted fibular neck fracture, with mild spreading of the fragments and no  other displacement of this fracture. There is an acute spiral oblique fracture at about the junction of the mid and distal thirds of the tibial shaft, with 1/2 of a shaft width of lateral displacement of the distal fragment and otherwise with no other significant displacement. There is a hairline nondisplaced oblique fracture in the distal tibia distal to the spiral fracture site. There are no further acute fractures. There are 2 old fracture fixation screws inserted through the medial malleolus, two through the posterior calcaneus. There is also a chronic lateral sideplate fixation of the distal fibular shaft and malleolus. There is moderate secondary arthrosis at the ankle, midfoot collapse partially visible on 1 of the lateral views, and pes planus. There is mild generalized edema in the foreleg and ankle. IMPRESSION: 1. Acute transverse comminuted and mildly impacted fibular neck fracture, with mild spreading of the fragments. 2. Acute spiral oblique fracture, junction of the mid and distal thirds of the tibial shaft, with 1/2 of a shaft width of lateral displacement of the distal fragment. 3. Hairline nondisplaced oblique fracture in the distal tibia distal to the spiral fracture site. 4. Osteopenia and degenerative change without evidence of right knee fracture or dislocation. 5. No acute chest findings. Chronic changes as above. 6. No evidence of pelvic fracture or diastasis. 7. Aortic and iliac atherosclerosis. 8. Old fracture fixation screws through the medial malleolus, posterior calcaneus, and distal fibular shaft and malleolus. 9. Midfoot collapse partially visible on 1 of the lateral views, and pes planus. Electronically Signed   By: Almira Bar M.D.   On: 04/23/2023 06:34   DG Tibia/Fibula Left  Result Date: 04/20/2023 CLINICAL DATA:  Fall injury with polytrauma. EXAM: RIGHT KNEE - 1-2 VIEW; PORTABLE CHEST - 1 VIEW; PORTABLE PELVIS 1-2 VIEWS; LEFT TIBIA AND FIBULA - 2 VIEW COMPARISON:  PA and  lateral chest 04/15/2021, AP pelvis 01/23/2022. No prior right knee films. No prior left tibia fibula film. FINDINGS: Chest AP portable 5:37 a.m.: There is mild cardiomegaly without evidence of CHF. The lungs clear aside from chronic left upper lobe linear scarring. There is mild chronic elevation of the right diaphragm. The mediastinum is stable with aortic tortuosity and patchy calcification. No pleural effusion or pneumothorax is evident. There is osteopenia with degenerative changes and slight dextroscoliosis of the thoracic spine. No acute osseous findings. 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 Portable AP pelvis single view: No pelvic fracture or diastasis is seen. There is osteopenia. There is mild arthrosis symmetrically at the hips, SI joints, pubic symphysis. Both proximal femurs are grossly intact AP. There is calcification in both common iliac arteries, right pelvic phleboliths. Right knee, AP Lat only: There is osteopenia without evidence of fracture or dislocation. No focal osseous lesion is suspected. 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 There  is a linear calcific body posterior to the joint space. Unclear if this is a fabella or an intra-articular loose body. There is no suprapatellar bursal effusion. Superficial soft tissues are unremarkable. Left tibia fibula series, AP and lateral with multiple films obtained: Osteopenia. There is an acute transverse comminuted and mildly impacted fibular neck fracture, with mild spreading of the fragments and no other displacement of this fracture. There is an acute spiral oblique fracture at about the junction of the mid and distal thirds of the tibial shaft, with 1/2 of a shaft width of lateral displacement of the distal fragment and otherwise with no other  significant displacement. There is a hairline nondisplaced oblique fracture in the distal tibia distal to the spiral fracture site. There are no further acute fractures. There are 2 old fracture fixation screws inserted through the medial malleolus, two through the posterior calcaneus. There is also a chronic lateral sideplate fixation of the distal fibular shaft and malleolus. There is moderate secondary arthrosis at the ankle, midfoot collapse partially visible on 1 of the lateral views, and pes planus. There is mild generalized edema in the foreleg and ankle. IMPRESSION: 1. Acute transverse comminuted and mildly impacted fibular neck fracture, with mild spreading of the fragments. 2. Acute spiral oblique fracture, junction of the mid and distal thirds of the tibial shaft, with 1/2 of a shaft width of lateral displacement of the distal fragment. 3. Hairline nondisplaced oblique fracture in the distal tibia distal to the spiral fracture site. 4. Osteopenia and degenerative change without evidence of right knee fracture or dislocation. 5. No acute chest findings. Chronic changes as above. 6. No evidence of pelvic fracture or diastasis. 7. Aortic and iliac atherosclerosis. 8. Old fracture fixation screws through the medial malleolus, posterior calcaneus, and distal fibular shaft and malleolus. 9. Midfoot collapse partially visible on 1 of the lateral views, and pes planus. Electronically Signed   By: Almira Bar M.D.   On: 05/04/2023 06:34   CT HEAD WO CONTRAST ( )  Result Date: 05/03/2023 CLINICAL DATA:  Fall at home.  Blunt poly trauma EXAM: CT HEAD WITHOUT CONTRAST CT CERVICAL SPINE WITHOUT CONTRAST TECHNIQUE: Multidetector CT imaging of the head and cervical spine was performed following the standard protocol without intravenous contrast. Multiplanar CT image reconstructions of the cervical spine were also generated. RADIATION DOSE REDUCTION: This exam was performed according to the departmental  dose-optimization program which includes automated exposure control, adjustment of the mA and/or kV according to patient size and/or use of iterative reconstruction technique. COMPARISON:  Brain MRI 01/07/2022 FINDINGS: CT HEAD FINDINGS Brain: No evidence of acute infarction, hemorrhage, hydrocephalus, extra-axial collection or mass lesion/mass effect. Cerebral volume loss and chronic small vessel ischemia that is mild for age. Vascular: No hyperdense vessel or unexpected calcification. Skull: No acute finding Sinuses/Orbits: No evidence of injury.  Right scleral band. CT CERVICAL SPINE FINDINGS Alignment: No traumatic malalignment.  Mild scoliosis Skull base and vertebrae: No acute fracture. No primary bone lesion or focal pathologic process. Soft tissues and spinal canal: No prevertebral fluid or swelling. No visible canal hematoma. Disc levels: Advanced and generalized disc and facet degeneration with diffuse spurring and disc collapse. Endplate sclerosis at multiple levels. 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 Upper chest: No visible injury IMPRESSION: No evidence of acute intracranial or cervical spine injury. Electronically Signed   By: Tiburcio Pea M.D.   On: 04/28/2023 05:42   CT Cervical Spine Wo Contrast  Result Date: 04/22/2023 CLINICAL DATA:  Fall at home.  Blunt poly trauma EXAM: CT HEAD WITHOUT CONTRAST CT CERVICAL SPINE WITHOUT CONTRAST TECHNIQUE: Multidetector CT imaging of the head and cervical spine was performed following the standard protocol without intravenous contrast. Multiplanar CT image reconstructions of the cervical spine were also generated. RADIATION DOSE REDUCTION: This exam was performed according to the departmental dose-optimization program which includes automated exposure control, adjustment of the mA and/or kV according to patient size and/or use of iterative reconstruction technique. COMPARISON:  Brain MRI 01/07/2022 FINDINGS: CT HEAD FINDINGS Brain: No evidence of  acute infarction, hemorrhage, hydrocephalus, extra-axial collection or mass lesion/mass effect. Cerebral volume loss and chronic small vessel ischemia that is mild for age. Vascular: No hyperdense vessel or unexpected calcification. Skull: No acute finding Sinuses/Orbits: No evidence of injury.  Right scleral band. CT CERVICAL SPINE FINDINGS Alignment: No traumatic malalignment.  Mild scoliosis Skull base and vertebrae: No acute fracture. No primary bone lesion or focal pathologic process. Soft tissues and spinal canal: No prevertebral fluid or swelling. No visible canal hematoma. Disc levels: Advanced and generalized disc and facet degeneration with diffuse spurring and disc collapse. Endplate sclerosis at multiple levels. 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 Upper chest: No visible injury IMPRESSION: No evidence of acute intracranial or cervical spine injury. Electronically Signed   By: Tiburcio Pea M.D.   On: 04/18/2023 05:42    Review of Systems  HENT:  Negative for ear discharge, ear pain, hearing loss and tinnitus.   Eyes:  Negative for photophobia and pain.  Respiratory:  Negative for cough and shortness of breath.   Cardiovascular:  Negative for chest pain.  Gastrointestinal:  Negative for abdominal pain, nausea and vomiting.  Genitourinary:  Negative for dysuria, flank pain, frequency and urgency.  Musculoskeletal:  Positive for arthralgias (Left lower leg). Negative for back pain, myalgias and neck pain.  Neurological:  Negative for dizziness and headaches.  Hematological:  Does not bruise/bleed easily.  Psychiatric/Behavioral:  The patient is not nervous/anxious.    Blood pressure (!) 149/76, pulse 71, temperature 99.1 F (37.3 C), resp. rate (!) 21, SpO2 94%. Physical Exam Constitutional:      General: She is not in acute distress.    Appearance: She is well-developed. She is not diaphoretic.  HENT:     Head: Normocephalic and atraumatic.  Eyes:     General: No scleral icterus.        Right eye: No discharge.        Left eye: No discharge.     Conjunctiva/sclera: Conjunctivae normal.  Cardiovascular:     Rate and Rhythm: Normal rate and regular rhythm.  Pulmonary:     Effort: Pulmonary effort is normal. No respiratory distress.  Musculoskeletal:     Cervical back: Normal range of motion.     Comments: LLE No traumatic wounds, ecchymosis, or rash  Short leg splint in place  No knee effusion  Knee stable to varus/ valgus and anterior/posterior stress  Sens DPN, SPN, TN intact  Motor EHL 5/5  Toes perfused, No significant edema  Skin:    General: Skin is warm and dry.  Neurological:     Mental Status: She is alert.  Psychiatric:        Mood and Affect: Mood normal.        Behavior: Behavior normal.     Assessment/Plan: Left tibia fx -- Plan IMN today with Dr. Jena Gauss. Please keep NPO.    Freeman Caldron, PA-C Orthopedic Surgery [phone] 05/09/2023, 9:00 AM

## 2023-04-25 NOTE — ED Notes (Signed)
Patient not on O2 at home has been desating with good pleth, even prior to pain medication. Provider made aware and patient placed on 3L of O2. New orders received.

## 2023-04-25 NOTE — Progress Notes (Signed)
Pt arrived to room 5N12 via stretcher from the ED. Received report from [employer], Charity fundraiser. See assessment.

## 2023-04-25 NOTE — ED Notes (Signed)
Report called to 5N

## 2023-04-25 NOTE — ED Notes (Signed)
Assisted ortho tech with splinting. Patient tolerated well.

## 2023-04-25 NOTE — Plan of Care (Signed)
Patient presents to the emergency room early this morning with a closed tibial shaft fracture on the left after a fall from standing.  She uses a walker at baseline.  There is no concern for compartment syndrome at this time.  Compartments are compressible and soft left lower extremity.  Given the complexity of her fracture right radius radius likely hardware, I will plan for definitive consultation with the Ortho trauma team for definitive management today.  Please remain n.p.o. in preparation for OR.

## 2023-04-25 NOTE — Progress Notes (Signed)
Orthopedic Tech Progress Note Patient Details:  Sabrina Baker [birth year] [id number]  Ortho Devices Type of Ortho Device: CAM walker Ortho Device/Splint Location: LLE Ortho Device/Splint Interventions: Ordered, Application   Post Interventions Patient Tolerated: Well Instructions Provided: Care of device  Sabrina Baker E Sabrina Baker 04/18/2023, 7:00 PM

## 2023-04-25 NOTE — H&P (Signed)
History and Physical    Patient: Sabrina Baker EXB:[id number] DOB: November 24, 1940 DOA: 04/27/2023 DOS: the patient was seen and examined on 05/09/2023 PCP: Laurann Montana, MD  Patient coming from: Home (lives alone) via EMS  Chief Complaint:  Chief Complaint  Patient presents with   Fall   HPI: Sabrina Baker is a 82 y.o. female with medical history significant of hypertension, hyperlipidemia, TIA, carotid stenosis, diabetes mellitus type 2, CKD stage III, and history of breast cancer who presents after having a fall.  At baseline patient ambulates with the use of a rolling walker.  She had been getting out of bed and the brakes on her rollator were not marked when it slipped out" causing her to fall.  She does not think that she hit her head and denied losing consciousness.  She complained of severe pain in her left leg and was unable to ambulate.  Patient does also report having significant stiffness in the right knee for which she suspects she is possibly developing gout flare.   In the emergency department patient was noted to be afebrile, with respirations 21-27, blood pressures elevated up to 205/92, and O2 saturations currently maintained on 5 L of nasal cannula oxygen.  Labs significant for WBC 12.6, sodium 134, potassium 5.2, BUN 38, creatinine 1.26, glucose 236, high-sensitivity troponin 21 ->17, and BNP 245.  CT scan of the head and cervical spine did not note any acute abnormality.  X-rays revealed acute transverse communicated and mildly impacted fibular neck fracture, spiral oblique fracture of the mid to distal tibia, and hairline nondisplaced oblique fracture of the distal tibia.  Orthopedics have been consulted and plan on taking for surgical correction possibly today.  Patient had been given 1 L normal saline IV fluids and fentanyl IV for pain.  CT angiogram of the chest that also been obtained due to patient's hypoxia, but noted no signs for pulmonary embolism and chronic  postinfectious or inflammatory scarring concerning for interstitial lung disease.  Review of Systems: As mentioned in the history of present illness. All other systems reviewed and are negative. Past Medical History:  Diagnosis Date   Acute diastolic congestive heart failure (HCC)    Breast cancer (HCC)    right breast   Carotid stenosis    Carotid US 1/22: Bilateral ICA 1-39; follow-up as needed   Chronic kidney disease    stage 3   Diabetes mellitus without complication (HCC)    Gout    Hypercholesteremia    Hypertension    Neuromuscular disorder (HCC)    neuropathy   Osteopenia    Retinal detachment    Spinal stenosis    TIA (transient ischemic attack)    Vitamin B12 deficiency    Past Surgical History:  Procedure Laterality Date   25 GAUGE PARS PLANA VITRECTOMY WITH 20 GAUGE MVR PORT Right 03/16/2015   Procedure: 25 GAUGE PARS PLANA VITRECTOMY WITH 23 GAUGE MVR PORT;  Surgeon: Sherrie George, MD;  Location: [facility] OR;  Service: Ophthalmology;  Laterality: Right;   ANKLE SURGERY     BREAST BIOPSY Right 06/28/2022   [place] RT BREAST BX W LOC DEV 1ST LESION IMG BX SPEC US GUIDE 06/28/2022 [facility]   BREAST BIOPSY  08/02/2022   MM RT RADIOACTIVE SEED LOC MAMMO GUIDE 08/02/2022 [facility]   BREAST EXCISIONAL BIOPSY     BREAST LUMPECTOMY     BREAST LUMPECTOMY WITH RADIOACTIVE SEED LOCALIZATION Right 01/24/2016   Procedure: RIGHT BREAST LUMPECTOMY WITH RADIOACTIVE SEED  LOCALIZATION;  Surgeon: Abigail Miyamoto, MD;  Location: [facility];  Service: General;  Laterality: Right;   BREAST LUMPECTOMY WITH RADIOACTIVE SEED LOCALIZATION Right 08/03/2022   Procedure: RIGHT BREAST LUMPECTOMY WITH RADIOACTIVE SEED LOCALIZATION;  Surgeon: Abigail Miyamoto, MD;  Location:  SURGERY CENTER;  Service: General;  Laterality: Right;  LMA   EYE SURGERY     detached retna X2   HEEL SPUR SURGERY  2012   INCISION AND DRAINAGE ABSCESS Right 11/21/2019   Procedure:  INCISION AND DRAINAGE FOREHEAD & RIGHT EAR ABSCESS;  Surgeon: Osborn Coho, MD;  Location: [facility] OR;  Service: ENT;  Laterality: Right;   INJECTION OF SILICONE OIL Right 03/16/2015   Procedure: Extraction OF SILICONE OIL;  Surgeon: Sherrie George, MD;  Location: [facility] OR;  Service: Ophthalmology;  Laterality: Right;   ORIF ANKLE FRACTURE Left 06/17/2014   Procedure: OPEN REDUCTION INTERNAL FIXATION (ORIF) LEFT BIMALLEOLAR ANKLE FRACTURE;  Surgeon: Cheral Almas, MD;  Location: [facility] OR;  Service: Orthopedics;  Laterality: Left;   PHOTOCOAGULATION WITH LASER Right 03/16/2015   Procedure: PHOTOCOAGULATION WITH LASER;  Surgeon: Sherrie George, MD;  Location: [facility] OR;  Service: Ophthalmology;  Laterality: Right;   SHOULDER ARTHROSCOPY Right    plates and screws   VITRECTOMY Right 03/16/2015   WRIST ARTHROPLASTY Left    plates and screws and bone graft   Social History:  reports that she has never smoked. She has been exposed to tobacco smoke. She has never used smokeless tobacco. She reports that she does not drink alcohol and does not use drugs.  Allergies  Allergen Reactions   Ramipril Anaphylaxis and Other (See Comments)   Cortisone Itching and Rash   Niacin Diarrhea    And nausea   Niacin And Related Diarrhea   Valsartan Other (See Comments)    Hyperkalemia     History reviewed. No pertinent family history.  Prior to Admission medications   Medication Sig Start Date End Date Taking? Authorizing Provider  ELIQUIS 2.5 MG TABS tablet TAKE 1 TABLET BY MOUTH TWICE A DAY 04/18/23  Yes Nahser, Deloris Ping, MD  acetaminophen (TYLENOL) 650 MG CR tablet Take 1,300 mg by mouth every 8 (eight) hours as needed for pain.    [provider]  amLODipine (NORVASC) 2.5 MG tablet Take 2.5 mg by mouth daily. 11/20/22   [provider]  anastrozole (ARIMIDEX) 1 MG tablet Take 1 tablet (1 mg total) by mouth daily. 08/14/22   Serena Croissant, MD  atorvastatin (LIPITOR) 10 MG tablet Take 10 mg by  mouth daily.    [provider]  bevacizumab (AVASTIN) 400 MG/16ML SOLN Inject into the vein.    [provider]  bevacizumab (AVASTIN) 400 MG/16ML SOLN Apply to eye. 12/31/20   [provider]  Blood Glucose Monitoring Suppl (GLUCOCOM BLOOD GLUCOSE MONITOR) DEVI Use to test blood sugar 3 times a day (E11.65) 06/18/20   [provider]  brimonidine (ALPHAGAN P) 0.1 % SOLN     [provider]  brimonidine (ALPHAGAN) 0.2 % ophthalmic solution Place 1 drop into the left eye 2 (two) times daily. 04/01/22   [provider]  calcitRIOL (ROCALTROL) 0.25 MCG capsule Take 0.25 mcg by mouth 3 (three) times a week. Paulo Fruit, Friday 12/17/20   [provider]  calcium-vitamin D (OSCAL WITH D) 500-200 MG-UNIT TABS tablet Take 1 tablet by mouth daily.     [provider]  carvedilol (COREG) 25 MG tablet Take 25 mg by  mouth 2 (two) times daily with a meal.    [provider]  ciprofloxacin (CILOXAN) 0.3 % ophthalmic solution SMARTSIG:In Eye(s) 11/28/22   [provider]  colchicine 0.6 MG tablet 1 tablet Orally once a day as needed for acute gout flare for 90 days    [provider]  cyanocobalamin 100 MCG tablet Take 100 mcg by mouth daily.    [provider]  diclofenac Sodium (VOLTAREN) 1 % GEL Apply topically 4 (four) times daily. As needed    [provider]  DULoxetine (CYMBALTA) 60 MG capsule Take 60 mg by mouth daily. 07/03/18   [provider]  febuxostat (ULORIC) 40 MG tablet Take 40 mg by mouth daily. One tab a day    [provider]  Ferrous Sulfate (IRON) 325 (65 FE) MG TABS Take 1 tablet by mouth 2 (two) times daily.    [provider]  furosemide (LASIX) 20 MG tablet Take 20 mg by mouth daily. 01/18/23   [provider]  gabapentin (NEURONTIN) 400 MG capsule Take 1 capsule (400 mg total) by mouth 3 (three) times daily. Can take 2x/day- can increase if need  be, to a max of 400 mg 3x/day- for nerve pain 04/11/23   Lovorn, Aundra Millet, MD  HYDROcodone-acetaminophen (NORCO) 7.5-325 MG tablet Take 1 tablet by mouth every 6 (six) hours as needed for moderate pain (pain score 4-6). Fill 12/24- 04/11/23   Lovorn, Aundra Millet, MD  HYDROcodone-acetaminophen (NORCO) 7.5-325 MG tablet Take 1 tablet by mouth every 8 (eight) hours as needed for moderate pain (pain score 4-6). 04/11/23   Lovorn, Aundra Millet, MD  Insulin Pen Needle 32G X 4 MM MISC USE TO INJECT INSULIN ONCE A DAY 04/05/18   [provider]  ketoconazole (NIZORAL) 2 % shampoo Apply 1 application topically 2 (two) times a week.  02/27/19   [provider]  Lancets 30G MISC Use to test blood sugars 3 times a day (Dx: E11.65)    [provider]  mupirocin ointment (BACTROBAN) 2 % 1 application to affected area Externally Dx: R21 twice a day as needed    [provider]  nateglinide (STARLIX) 60 MG tablet Take by mouth. 04/04/23   [provider]  ONETOUCH VERIO test strip USE AS DIRECTED. TESTING FREQUENCY  3 X/DAILY (DX E11.65) 11/17/18   [provider]  pantoprazole (PROTONIX) 40 MG tablet 1 tablet Orally twice a day Dx: K21.9 for 90 days    [provider]  TOUJEO SOLOSTAR 300 UNIT/ML Solostar Pen Inject into the skin. 04/04/23   [provider]  triamcinolone cream (KENALOG) 0.1 % Apply 1 application topically 2 (two) times daily as needed (For rash).  01/09/17   [provider]  trimethoprim-polymyxin b (POLYTRIM) ophthalmic solution Use 4 times in left eye the day of injection, 4 drop the following day.    [provider]  TRULICITY 3 MG/0.5ML SOAJ Inject into the skin once a week.    [provider]  VELTASSA 8.4 g packet PLEASE SEE ATTACHED FOR DETAILED DIRECTIONS 02/26/23   [provider]    Physical Exam: Vitals:   05/08/2023 0645 04/17/2023 0745 05/11/2023 0815 05/03/2023 0841  BP: (!) 205/92 (!) 100/47 (!)  149/76   Pulse: 78 77 71   Resp: 12 (!) 27 (!) 21   Temp:    99.1 F (37.3 C)  SpO2: 94% 100% 94%     Constitutional: Elderly female currently in no acute distress Eyes: PERRL, lids  and conjunctivae normal ENMT: Mucous membranes are moist. Normal dentition.  Neck: normal, supple   Respiratory: Patient appears fairly clear to auscultation with intermittent crackles.  Currently on 4 L of nasal cannula oxygen with O2 saturations maintained. Cardiovascular: Regular rate and rhythm, no murmurs / rubs / gallops. No extremity edema. 2+ pedal pulses. No carotid bruits.  Abdomen: no tenderness, no masses palpated. No hepatosplenomegaly. Bowel sounds positive.  Musculoskeletal: Left leg currently in cast.  No significant erythema or swelling noted of the right knee. Skin: no rashes, lesions, ulcers. No induration Neurologic: CN 2-12 grossly intact.   Strength 5/5 in all 4.  Psychiatric: Normal judgment and insight. Alert and oriented x 3. Normal mood.   Data Reviewed:  EKG reveals sinus rhythm at 73 bpm with premature atrial complexes and left axis deviation.  Reviewed labs, imaging, and pertinent records as documented  Assessment and Plan: Closed left tibia fracture secondary to fall Patient presents after having a fall while using her rollator.  X-rays revealed left tibia fractures.  Orthopedics consulted and plan on taking patient for cervical correction this afternoon. -Admit to a surgical telemetry bed -Hip fracture order set utilized -N.p.o. for need of procedure -Hydrocodone/morphine IV as needed for moderate to severe pain -Appreciate orthopedic consultative services,  will for follow-up for any further recommendations.  SIRS Patient was noted to be febrile up to 102.5 F with tachypnea and white blood cell count elevated at 12.6.  Initial lactic acid reassuring at 1.  Urinalysis without significant signs for infection.  Unclear cause of symptoms at this time, but question respiratory  cause of symptoms. -Check blood cultures -Check COVID-19 and respiratory virus panel  -Check procalcitonin -Check urine Legionella and urine strep -Empiric antibiotics of Rocephin and azithromycin.  Adjust antibiotics as deemed medically appropriate  Acute respiratory failure with hypoxia At baseline patient is not normally on oxygen, but was requiring 5 L of nasal cannula oxygen to maintain O2 saturations while in the emergency department.  Question the possibility of fat embolus, but CT angiogram of the chest chronic postinfectious or inflammatory scarring concerning for interstitial lung disease. -Continuous pulse oximetry with oxygen maintain O2 saturation greater than 92%. -Albuterol nebs as needed for shortness of breath/wheezing  Hyperkalemia Acute.  Potassium elevated at 5.2. -Recheck kidney function in a.m.  Hyponatremia Acute.  Sodium noted to be 134.  Question possibility of hypovolemic hyponatremia.  Patient had been given 1 L lactated Ringer's while in the ED. -Recheck kidney function in a.m.  Essential hypertension Blood pressures currently maintained. -Continue amlodipine and Coreg -Hold furosemide at this time  Diastolic congestive heart failure Chronic.  On physical exam she did not appear grossly fluid overloaded.  BNP 245 which appears similar to priors.  Last EF noted to be 60 to 65% with indeterminate diastolic parameters back in 03/2021.  Patient was scheduled to have repeat echocardiogram on 11/4 that had not been obtained yet. -Strict I&Os and daily weights -Consider need of echocardiogram for further evaluation  Diabetes mellitus type 2 with hyperglycemia, with long-term use of insulin On admission glucose elevated at 263.  Last available hemoglobin A1c was 8.1 when checked on 03/04/2021.  Home regimen includes Toujeo 20 units daily. -Hypoglycemia protocols -Held nateglinide and Trulicity -Pharmacy substitution of Semglee 10 units -CBGs before every meal  with moderate SSI  Chronic kidney disease stage IIIb Creatinine 1.26 with BUN 38.  Elevated BUN to creatinine ratio suggest possible prerenal cause of symptoms. -Hold furosemide -Recheck kidney function in  a.m.  Hyperlipidemia -Continue Lipitor  Possible gout flare of right knee Patient reports having pain and stiffness in the right knee for which she suspect possible gout flare.  No significant erythema or swelling appreciated. -Colchicine  History of breast cancer -Continue anastrozole  GERD -Continue Protonix  DVT prophylaxis: Eliquis currently on hold  Advance Care Planning:   Code Status: Full Code   Consults: Orthopedics  Family Communication: Guest in room updated.  Severity of Illness: The appropriate patient status for this patient is INPATIENT. Inpatient status is judged to be reasonable and necessary in order to provide the required intensity of service to ensure the patient's safety. The patient's presenting symptoms, physical exam findings, and initial radiographic and laboratory data in the context of their chronic comorbidities is felt to place them at high risk for further clinical deterioration. Furthermore, it is not anticipated that the patient will be medically stable for discharge from the hospital within 2 midnights of admission.   * I certify that at the point of admission it is my clinical judgment that the patient will require inpatient hospital care spanning beyond 2 midnights from the point of admission due to high intensity of service, high risk for further deterioration and high frequency of surveillance required.*  Author: Clydie Braun, MD 05/10/2023 9:15 AM  For on call review [email or web address].

## 2023-04-25 NOTE — Progress Notes (Signed)
Pt returned to 5N12 via bed after surgery. Received report from [place], Charity fundraiser. See focused reassessment.

## 2023-04-25 NOTE — Transfer of Care (Signed)
Immediate Anesthesia Transfer of Care Note  Patient: Sabrina Baker  Procedure(s) Performed: INTRAMEDULLARY (IM) NAIL TIBIAL (Left)  Patient Location: PACU  Anesthesia Type:General  Level of Consciousness: drowsy and responds to stimulation  Airway & Oxygen Therapy: Patient Spontanous Breathing and Patient connected to face mask oxygen  Post-op Assessment: Report given to RN and Post -op Vital signs reviewed and stable  Post vital signs: Reviewed and stable  Last Vitals:  Vitals Value Taken Time  BP 133/62 04/28/2023 1527  Temp    Pulse 65 04/13/2023 1530  Resp 20 04/23/2023 1530  SpO2 99 % 05/11/2023 1530  Vitals shown include unfiled device data.  Last Pain:  Vitals:   05/03/2023 1127  TempSrc: Oral  PainSc:          Complications: No notable events documented.

## 2023-04-25 NOTE — Anesthesia Postprocedure Evaluation (Signed)
Anesthesia Post Note  Patient: KRISHELLE SOCHOR  Procedure(s) Performed: INTRAMEDULLARY (IM) NAIL TIBIAL (Left)     Patient location during evaluation: PACU Anesthesia Type: General Level of consciousness: awake and alert Pain management: pain level controlled Vital Signs Assessment: post-procedure vital signs reviewed and stable Respiratory status: spontaneous breathing, nonlabored ventilation, respiratory function stable and patient connected to nasal cannula oxygen Cardiovascular status: stable and blood pressure returned to baseline Anesthetic complications: no   No notable events documented.  Last Vitals:  Vitals:   04/20/2023 1545 05/11/2023 1600  BP: (!) 144/88 (!) 163/93  Pulse: 68 61  Resp: 15 20  Temp:    SpO2: 100% 95%    Last Pain:  Vitals:   05/09/2023 1600  TempSrc:   PainSc: 0-No pain                 Beryle Lathe

## 2023-04-25 NOTE — Op Note (Signed)
Orthopaedic Surgery Operative Note (CSN: [id number] ) Date of Surgery: 04/13/2023  Admit Date: 04/24/2023   Diagnoses: Pre-Op Diagnoses: Left tibial shaft fracture   Post-Op Diagnosis: Same  Procedures: CPT 27759-Intramedullary nailing of left tibia fracture  Surgeons : Primary: Roby Lofts, MD  Assistant: Sabrina Southward, PA-C  Location: OR 3   Anesthesia: General   Antibiotics: Ancef 2g preop with 1gm vancomycin powder placed topically   Tourniquet time: None    Estimated Blood Loss: 30 mL  Complications:* No complications entered in OR log *   Specimens:* No specimens in log *   Implants: Implant Name Type Inv. Item Serial No. Manufacturer Lot No. LRB No. Used Action  NAIL TIB TFNA [id number] - [id number] Nail NAIL TIB TFNA 10X300  [employer] [id number] Left 1 Implanted  SCREW LOCK IM NAIL 5X30 - [id number] Screw SCREW LOCK IM NAIL 5X30  [employer]  Left 1 Implanted  SCREW LOCK LP 5X38 LT - [id number] Screw SCREW LOCK LP 5X38 LT  [employer]  Left 1 Implanted  SCREW LOCK HDLS 5X54 NSTRL - [id number] Screw SCREW LOCK HDLS 5X54 NSTRL  [employer]  Left 1 Implanted  SCREW LOCK LP THRD 5X32 NS - [id number] Screw SCREW LOCK LP THRD 5X32 NS  [employer]  Left 1 Implanted     Indications for Surgery: 82 year old female who sustained a ground-level fall with a left tibial shaft fracture.  Due to the unstable nature of her injury I recommend proceeding with intramedullary nailing of left tibia.  Risks and benefits were discussed with the patient.  Risks included but not limited to bleeding, infection, malunion, nonunion, hardware failure, hardware irritation, nerve and blood vessel injury, DVT, even the possibility anesthetic complications.  She agreed to proceed with surgery and consent was obtained.  Operative Findings: Intramedullary nailing of left tibial shaft fracture using Synthes 10 x 300 mm tibial nail  Procedure: The patient was  identified in the preoperative holding area. Consent was confirmed with the patient and their family and all questions were answered. The operative extremity was marked after confirmation with the patient. she was then brought back to the operating room by our anesthesia colleagues.  She was placed under general anesthetic and carefully transferred over to radiolucent flattop table.  A bump was placed under her operative hip.  The left lower extremity was then prepped and draped in usual sterile fashion.  A timeout was performed to verify the patient, the procedure, and the extremity.  Preoperative antibiotics were dosed.  Fluoroscopic imaging showed the unstable nature of her injury.  A lateral parapatellar incision was made and carried down through skin and subcutaneous tissue.  A threaded guidewire was placed at the appropriate starting point and advanced into the proximal metaphysis.  I then used an awl to enter the medullary canal.  I then passed a ball-tipped guidewire down the center of the canal and seated it into the distal metaphysis.  I measured the length and chose to use a 300 mm nail.  I then sequentially reamed up to 11 mm obtaining excellent chatter.  I then passed a 10 x 300 mm nail down the center of the canal the fracture lined up anatomically.  I then used perfect circle technique to place 2 medial to lateral distal interlocking screws.  I used the targeting arm to place 2 proximal interlocking screws.  Targeting arm was removed and final fluoroscopic imaging was obtained.  The incision was copiously irrigated.  A gram of vancomycin  powder was placed into the lateral parapatellar incision.  A layered closure of 0 Vicryl, 2-0 Monocryl and 3-0 Monocryl with Steri-Strips were used to close the skin.  Sterile dressings were applied.  Patient was placed in a boot.  She was awoke from anesthesia and taken to the PACU in stable condition.  Post Op Plan/Instructions: The patient will be  weightbearing as tolerated to the left lower extremity.  She will receive postoperative Ancef.  She may be restarted on her anticoagulation postoperative day 1.  Will have her mobilize with physical and Occupational Therapy.  I was present and performed the entire surgery.  Sabrina Southward, PA-C did assist me throughout the case. An assistant was necessary given the difficulty in approach, maintenance of reduction and ability to instrument the fracture.   Truitt Merle, MD [facility]

## 2023-04-25 NOTE — ED Provider Notes (Signed)
   Accepted handoff at shift change from [employer]. Please see prior provider note for more detail.   Briefly: Patient is 82 y.o. "chief complaint of fall.  She states that she was walking to the bathroom with her rollator and the walker got away from her.  She states that she slid down to the ground.  She says she's not sure if she hit her head.  Denies LOC.  She does take eliquis.  She complains of left leg pain, right knee pain, and right  shoulder pain.  She states that she is having a hard time straightening her left leg.  She rates her pain as 8/10.  She denies any other associated symptoms. "  Plan:  - Admitting patient for fractured fibula and tibia from fall and new onset hypoxia. - ortho following. - PMHx CHF, CKD, HLD, HTN, osteopenia, Afib on Eliquis - Presents to ED after fall, patient is unsure if she hit her head but denies LOC -CBC with mild leukocytosis at 12.6.  No anemia.  Initial troponin 21.  Repeat troponin pending.  CMP with mild hyponatremia 134, mild hyperkalemia at 5.2.  Elevated glucose at 236.  BUN/creatinine at baseline at 38/1.26.  UA not concerning for infection.  BNP is elevated at 245 - physical exam not concerning for fluid overload. -CT head/cervical spine without acute process.  X-ray showing patient's tibial/fibular fractures. CT chest without PE; there is areas of chronic post infectious vs inflammatory scarring vs possible interstitial lung disease. I am still concerned that patient may have fat embolus given her 97% O2 on 5L. - patient has been provided with pain control and IV fluids in ED. - Dr. Katrinka Blazing admitting provider.    Dorthy Cooler, [place] 05/02/2023 1478    Marily Memos, MD 04/28/2023 0730

## 2023-04-25 NOTE — ED Triage Notes (Signed)
Patient from home, fell walking to the bathroom. Said her leg gave out. She is on Eliquis. Denies hitting her head, no neck or back pain. Says she has right shoulder and right knee pain. Left leg deformity, EMS says there is some shortening and lateral rotation. CSMS intact.Patient is alert, oriented x4 and no bleeding.  VS  172/86 71 96% CBG 228

## 2023-04-25 NOTE — Interval H&P Note (Signed)
Patient is indicated for intramedullary nailing of left tibial shaft fracture.  Risks and benefits were discussed with the patient.  Risks include but not limited to bleeding, infection, malunion, nonunion, hardware failure, hardware rotation, nerve and blood vessel injury, DVT, even the possibility anesthetic complications.  She agreed to proceed with surgery and consent was obtained.  Roby Lofts, MD [facility] [phone] (office) [email or web address]

## 2023-04-25 NOTE — Progress Notes (Signed)
Transition of Care [facility]) - CAGE-AID Screening   Patient Details  Name: Sabrina Baker MRN: [id number] Date of Birth: [birth year]  Transition of Care [facility]) CM/SW Contact:    Leota Sauers, RN Phone Number: 05/02/2023, 9:46 PM   Clinical Narrative:  Patient denies use of alcohol and illicit substances. Resources not given at this time.   CAGE-AID Screening:    Have You Ever Felt You Ought to Cut Down on Your Drinking or Drug Use?: No Have People Annoyed You By Critizing Your Drinking Or Drug Use?: No Have You Felt Bad Or Guilty About Your Drinking Or Drug Use?: No Have You Ever Had a Drink or Used Drugs First Thing In The Morning to Steady Your Nerves or to Get Rid of a Hangover?: No CAGE-AID Score: 0  Substance Abuse Education Offered: No

## 2023-04-25 NOTE — Anesthesia Preprocedure Evaluation (Addendum)
Anesthesia Evaluation  Patient identified by MRN, date of birth, ID band Patient awake    Reviewed: Allergy & Precautions, NPO status , Patient's Chart, lab work & pertinent test results  History of Anesthesia Complications Negative for: history of anesthetic complications  Airway Mallampati: II  TM Distance: >3 FB Neck ROM: Full    Dental  (+) Edentulous Upper, Edentulous Lower   Pulmonary neg pulmonary ROS   Pulmonary exam normal        Cardiovascular hypertension, Pt. on medications Normal cardiovascular exam   '22 TTE - EF 60 to 65%. There is moderate left ventricular hypertrophy. A small pericardial effusion is present. Mild to moderate aortic valve stenosis. Vmax 2.3 m/s, MG 12 mmHg, AVA 1.5 cm^2, DI 0.45     Neuro/Psych TIA Neuromuscular disease  negative psych ROS   GI/Hepatic negative GI ROS, Neg liver ROS,,,  Endo/Other  diabetes, Type 2, Insulin Dependent   K 5.2   Renal/GU CRFRenal disease     Musculoskeletal  (+) Arthritis ,   Gout    Abdominal   Peds  Hematology  On eliquis    Anesthesia Other Findings On GLP-1a, last dose 7 days ago    Reproductive/Obstetrics  Breast cancer                               Anesthesia Physical Anesthesia Plan  ASA: 3  Anesthesia Plan: General   Post-op Pain Management: Tylenol PO (pre-op)*   Induction: Intravenous  PONV Risk Score and Plan: 3 and Treatment may vary due to age or medical condition and Ondansetron  Airway Management Planned: Oral ETT  Additional Equipment: None  Intra-op Plan:   Post-operative Plan: Extubation in OR  Informed Consent: I have reviewed the patients History and Physical, chart, labs and discussed the procedure including the risks, benefits and alternatives for the proposed anesthesia with the patient or authorized representative who has indicated his/her understanding and acceptance.      Dental advisory given  Plan Discussed with: CRNA and Anesthesiologist  Anesthesia Plan Comments:         Anesthesia Quick Evaluation

## 2023-04-26 ENCOUNTER — Inpatient Hospital Stay (HOSPITAL_COMMUNITY): Payer: 59

## 2023-04-26 ENCOUNTER — Other Ambulatory Visit (HOSPITAL_COMMUNITY): Payer: 59

## 2023-04-26 ENCOUNTER — Other Ambulatory Visit: Payer: Self-pay | Admitting: Student

## 2023-04-26 DIAGNOSIS — I469 Cardiac arrest, cause unspecified: Secondary | ICD-10-CM | POA: Diagnosis not present

## 2023-04-26 DIAGNOSIS — S82222A Displaced transverse fracture of shaft of left tibia, initial encounter for closed fracture: Secondary | ICD-10-CM

## 2023-04-26 DIAGNOSIS — J9601 Acute respiratory failure with hypoxia: Secondary | ICD-10-CM

## 2023-04-26 LAB — PROTIME-INR
INR: 1.3 — ABNORMAL HIGH (ref 0.8–1.2)
Prothrombin Time: 16.4 s — ABNORMAL HIGH (ref 11.4–15.2)

## 2023-04-26 LAB — POCT I-STAT 7, (LYTES, BLD GAS, ICA,H+H)
Acid-base deficit: 6 mmol/L — ABNORMAL HIGH (ref 0.0–2.0)
Bicarbonate: 19.6 mmol/L — ABNORMAL LOW (ref 20.0–28.0)
Calcium, Ion: 1.36 mmol/L (ref 1.15–1.40)
HCT: 33 % — ABNORMAL LOW (ref 36.0–46.0)
Hemoglobin: 11.2 g/dL — ABNORMAL LOW (ref 12.0–15.0)
O2 Saturation: 96 %
Patient temperature: 94
Potassium: 5.5 mmol/L — ABNORMAL HIGH (ref 3.5–5.1)
Sodium: 136 mmol/L (ref 135–145)
TCO2: 21 mmol/L — ABNORMAL LOW (ref 22–32)
pCO2 arterial: 33.9 mm[Hg] (ref 32–48)
pH, Arterial: 7.358 (ref 7.35–7.45)
pO2, Arterial: 78 mm[Hg] — ABNORMAL LOW (ref 83–108)

## 2023-04-26 LAB — COMPREHENSIVE METABOLIC PANEL
ALT: 301 U/L — ABNORMAL HIGH (ref 0–44)
AST: 477 U/L — ABNORMAL HIGH (ref 15–41)
Albumin: 2.6 g/dL — ABNORMAL LOW (ref 3.5–5.0)
Alkaline Phosphatase: 52 U/L (ref 38–126)
Anion gap: 14 (ref 5–15)
BUN: 31 mg/dL — ABNORMAL HIGH (ref 8–23)
CO2: 20 mmol/L — ABNORMAL LOW (ref 22–32)
Calcium: 9.3 mg/dL (ref 8.9–10.3)
Chloride: 100 mmol/L (ref 98–111)
Creatinine, Ser: 1.77 mg/dL — ABNORMAL HIGH (ref 0.44–1.00)
GFR, Estimated: 29 mL/min — ABNORMAL LOW (ref >60)
Glucose, Bld: 379 mg/dL — ABNORMAL HIGH (ref 70–99)
Potassium: 5.6 mmol/L — ABNORMAL HIGH (ref 3.5–5.1)
Sodium: 134 mmol/L — ABNORMAL LOW (ref 135–145)
Total Bilirubin: 0.7 mg/dL (ref <1.2)
Total Protein: 5.4 g/dL — ABNORMAL LOW (ref 6.5–8.1)

## 2023-04-26 LAB — PHOSPHORUS: Phosphorus: 7 mg/dL — ABNORMAL HIGH (ref 2.5–4.6)

## 2023-04-26 LAB — GLUCOSE, CAPILLARY
Glucose-Capillary: 319 mg/dL — ABNORMAL HIGH (ref 70–99)
Glucose-Capillary: 326 mg/dL — ABNORMAL HIGH (ref 70–99)

## 2023-04-26 LAB — CBC
HCT: 37.7 % (ref 36.0–46.0)
Hemoglobin: 11.4 g/dL — ABNORMAL LOW (ref 12.0–15.0)
MCH: 31.6 pg (ref 26.0–34.0)
MCHC: 30.2 g/dL (ref 30.0–36.0)
MCV: 104.4 fL — ABNORMAL HIGH (ref 80.0–100.0)
Platelets: 196 10*3/uL (ref 150–400)
RBC: 3.61 MIL/uL — ABNORMAL LOW (ref 3.87–5.11)
RDW: 13.8 % (ref 11.5–15.5)
WBC: 25.4 10*3/uL — ABNORMAL HIGH (ref 4.0–10.5)
nRBC: 0.1 % (ref 0.0–0.2)

## 2023-04-26 LAB — LACTIC ACID, PLASMA: Lactic Acid, Venous: 6.5 mmol/L (ref 0.5–1.9)

## 2023-04-26 LAB — TROPONIN I (HIGH SENSITIVITY): Troponin I (High Sensitivity): 76 ng/L — ABNORMAL HIGH (ref <18)

## 2023-04-26 LAB — MAGNESIUM: Magnesium: 2 mg/dL (ref 1.7–2.4)

## 2023-04-26 MED ORDER — FENTANYL CITRATE PF 50 MCG/ML IJ SOSY: 25.0000 ug | PREFILLED_SYRINGE | Freq: Once | INTRAMUSCULAR | Status: DC

## 2023-04-26 MED ORDER — EPINEPHRINE 1 MG/10ML IJ SOSY
0.5000 mg | PREFILLED_SYRINGE | Freq: Once | INTRAMUSCULAR | Status: AC
  Administered 2023-04-26: 0.5 mg via INTRAVENOUS

## 2023-04-26 MED ORDER — LORAZEPAM 2 MG/ML IJ SOLN
2.0000 mg | Freq: Once | INTRAMUSCULAR | Status: AC
  Administered 2023-04-26: 2 mg via INTRAVENOUS
  Filled 2023-04-26: qty 1

## 2023-04-26 MED ORDER — FAMOTIDINE 20 MG PO TABS: 20.0000 mg | ORAL_TABLET | Freq: Every day | ORAL | Status: DC

## 2023-04-26 MED ORDER — DOCUSATE SODIUM 50 MG/5ML PO LIQD: 100.0000 mg | Freq: Two times a day (BID) | ORAL | Status: DC

## 2023-04-26 MED ORDER — LORAZEPAM 2 MG/ML IJ SOLN
INTRAMUSCULAR | Status: AC
  Filled 2023-04-26: qty 1

## 2023-04-26 MED ORDER — FENTANYL 2500MCG IN NS 250ML (10MCG/ML) PREMIX INFUSION
25.0000 ug/h | INTRAVENOUS | Status: DC
  Filled 2023-04-26: qty 250

## 2023-04-26 MED ORDER — AMIODARONE IV BOLUS ONLY 150 MG/100ML
INTRAVENOUS | Status: AC | PRN
  Administered 2023-04-26: 300 mg via INTRAVENOUS

## 2023-04-26 MED ORDER — POLYETHYLENE GLYCOL 3350 17 G PO PACK: 17.0000 g | PACK | Freq: Every day | ORAL | Status: DC

## 2023-04-26 MED ORDER — NOREPINEPHRINE 4 MG/250ML-% IV SOLN: 0.0000 ug/min | INTRAVENOUS | Status: DC

## 2023-04-26 MED ORDER — NOREPINEPHRINE 4 MG/250ML-% IV SOLN
0.0000 ug/min | INTRAVENOUS | Status: DC
  Administered 2023-04-26: 10 ug/min via INTRAVENOUS

## 2023-04-26 MED ORDER — ATROPINE SULFATE 1 MG/10ML IJ SOSY
PREFILLED_SYRINGE | INTRAMUSCULAR | Status: AC | PRN
  Administered 2023-04-26: 1 mg via INTRAVENOUS

## 2023-04-26 MED ORDER — SODIUM BICARBONATE 8.4 % IV SOLN
50.0000 meq | Freq: Once | INTRAVENOUS | Status: AC
  Administered 2023-04-26: 50 meq via INTRAVENOUS

## 2023-04-26 MED ORDER — FENTANYL BOLUS VIA INFUSION: 25.0000 ug | INTRAVENOUS | Status: DC | PRN

## 2023-04-26 MED ORDER — SODIUM CHLORIDE 0.9 % IV SOLN
1.0000 g | Freq: Once | INTRAVENOUS | Status: DC
  Filled 2023-04-26: qty 10

## 2023-04-26 MED ORDER — INSULIN ASPART 100 UNIT/ML IJ SOLN
0.0000 [IU] | INTRAMUSCULAR | Status: DC
Start: 2023-04-26 — End: 2023-04-26

## 2023-04-26 MED ORDER — EPINEPHRINE 1 MG/10ML IJ SOSY
PREFILLED_SYRINGE | INTRAMUSCULAR | Status: AC | PRN
  Administered 2023-04-26 (×2): 1 mg via INTRAVENOUS

## 2023-04-26 MED ORDER — NALOXONE HCL 0.4 MG/ML IJ SOLN
INTRAMUSCULAR | Status: AC
  Filled 2023-04-26: qty 1

## 2023-04-27 ENCOUNTER — Encounter (HOSPITAL_COMMUNITY): Payer: Self-pay | Admitting: Student

## 2023-04-30 LAB — CULTURE, BLOOD (ROUTINE X 2)
Culture: NO GROWTH
Culture: NO GROWTH
Special Requests: ADEQUATE
Special Requests: ADEQUATE

## 2023-05-04 ENCOUNTER — Ambulatory Visit: Payer: 59 | Admitting: Hematology and Oncology

## 2023-05-09 ENCOUNTER — Ambulatory Visit: Payer: 59 | Admitting: Podiatry

## 2023-05-13 NOTE — Progress Notes (Signed)
eLink Physician-Brief Progress Note Patient Name: Sabrina Baker DOB: 1940/08/21 MRN: [id number]   Date of Service  04/30/2023  HPI/Events of Note  Patient admitted to the ICU s/p Code Blue with ROSC, and post-operative respiratory failure requiring intubation and mechanical ventilation after intra-medullary nailing of a left tibial fracture.  eICU Interventions  New Patient Evaluation.        [name] 04/22/2023, 2:39 AM

## 2023-05-13 NOTE — Progress Notes (Signed)
Responded to CODE BLUE. IO in place, just placed prior to pt being transferred to this unit.

## 2023-05-13 NOTE — Progress Notes (Signed)
EEG attempted, pt coding. Will be attempted in am as schedule allows.

## 2023-05-13 NOTE — Progress Notes (Signed)
Patient arrived to 2H07 AT 0230 with CPR in progress. Pulse check was done and patient was found to have a pulse at this time. With NP at bedside Norepinephrine was started and titrated by verbal orders of Joneen Roach at bedside. Pulses were lost at 0332 and cpr was started. Briant Sites, DO was also at bedside with daughter discussing plan of care of the patient. The daughter states that the patient has previously stated she would not want long term care. The daughter wanted the patient to be changed to a DNR. Time of death was called at 04:14.

## 2023-05-13 NOTE — Death Summary Note (Signed)
DEATH SUMMARY   Patient Details  Name: Sabrina Baker MRN: [id number] DOB: July 05, 1940  Admission/Discharge Information   Admit Date:  05-25-2023  Date of Death: Date of Death: May 26, 2023  Time of Death: Time of Death: 0414  Length of Stay: 1  Referring Physician: Laurann Montana, MD    Diagnoses  Preliminary cause of death: presumed acute coronary syndrome Secondary Diagnoses (including complications and co-morbidities):  Principal Problem:   Fracture of tibial shaft, closed Active Problems:   HTN (hypertension)   Acute respiratory failure with hypoxia (HCC)   Gout   Chronic kidney disease, stage III (moderate) (HCC)   Fall at home, initial encounter   Hyperkalemia   SIRS (systemic inflammatory response syndrome) (HCC)   Hyponatremia   Chronic diastolic CHF (congestive heart failure) (HCC)   History of breast cancer   Cardiac arrest [facility])   Brief Hospital Course (including significant findings, care, treatment, and services provided and events leading to death)  82 year old female with PMH as below significant for CHF, CKD, DM who presented to [facility] ED 05/25/23 after a fall complaining of L ankle pain. Uses walker at baseline. In the ED she was hypertensive and febrile. Hypoxemic requiring 5L [place]. 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 Found to have L tibia fracture and was taken to the OR for intramedullary nailing. Procedure was without complication. PCCM called around 0200 26-May-2023 for cardiac arrest. The patient had been resting comfortably with no complaints previously. RN was alerted by telemetry regarding asystole. Upon RN evaluation the patient was unresponsive and pulseless. Underwent 7 minutes ACLS protocol in the PEA/asystole algorithm and 1 round for VT including defibrillation. During transport to the ICU she again went into PEA and had 6 additional minutes of CPR. Upon arrival to ICU ROSC was achieved. She was  bradycardic and hypotensive. NE infusion was started.   Patient had recurrent arrests on arrival to ICU and after discussion with family was made DNR and passed away.  Pertinent Labs and Studies  Significant Diagnostic Studies DG CHEST PORT 1 VIEW  Result Date: 26-May-2023 CLINICAL DATA:  82 year old female status post intubation. EXAM: PORTABLE CHEST 1 VIEW COMPARISON:  Chest x-ray 05/24/22. FINDINGS: An endotracheal tube is in place with tip 2.6 cm above the carina. Transcutaneous defibrillator pad projecting over the right hemithorax. Widespread but patchy areas of interstitial prominence, peribronchial cuffing and poorly defined airspace disease scattered throughout the lungs bilaterally, most confluent in the right upper lobe and throughout the left mid to lower lung, concerning for multilobar bilateral bronchopneumonia. No definite pleural effusions. Possible small left-sided pneumothorax. No appreciable right pneumothorax. Pulmonary vasculature is largely obscured. Heart size is normal. Upper mediastinal contours are within normal limits. Postoperative changes of ORIF are noted in the right proximal humerus. Extensive posttraumatic remodeling of the right glenohumeral joint. IMPRESSION: 1. Support apparatus, as above. 2. Probable small left-sided pneumothorax new compared to the prior study, occupying less than 5% of the volume of the left hemithorax at this time. Attention on follow-up studies is recommended. 3. Progressive multilobar bilateral bronchopneumonia, as above. These results will be called to the ordering clinician or representative by the Radiologist Assistant, and communication documented in the PACS or [employer]. Electronically Signed   By: Trudie Reed M.D.   On: 26-May-2023 05:01   DG Abd 1 View  Result Date: 05-26-2023 CLINICAL DATA:  82 year old female status post orogastric tube placement. EXAM: ABDOMEN - 1  VIEW COMPARISON:  Abdominal radiograph 08/25/2021.  FINDINGS: Tip of enteric tube projects over the mid body of the stomach with side port just distal to the gastroesophageal junction. Transcutaneous defibrillator pad projects over the left upper quadrant of the abdomen. Generalized paucity of bowel gas. No pathologic dilatation of small bowel or colon. No definite pneumoperitoneum noted on this single image. IMPRESSION: 1. Support apparatus, as above. 2. Nonobstructive bowel gas pattern with paucity of bowel gas. Electronically Signed   By: Trudie Reed M.D.   On: May 21, 2023 04:59   DG Tibia/Fibula Left Port  Result Date: 05/04/2023 CLINICAL DATA:  Fracture, postop EXAM: PORTABLE LEFT TIBIA AND FIBULA - 2 VIEW COMPARISON:  Preoperative imaging FINDINGS: Tibial intramedullary nail with proximal and distal locking screw fixation traversing tibial shaft fracture. Previous hardware in the distal tibia, fibula, and calcaneus, partially included. Proximal fibular shaft fracture is unchanged in alignment. IMPRESSION: 1. ORIF of tibial shaft fracture without immediate postoperative complication. 2. Unchanged alignment of proximal fibular shaft fracture. Electronically Signed   By: Narda Rutherford M.D.   On: 05/06/2023 17:40   DG Tibia/Fibula Left  Result Date: 04/16/2023 CLINICAL DATA:  Elective surgery. EXAM: LEFT TIBIA AND FIBULA - 2 VIEW COMPARISON:  Radiograph earlier today FINDINGS: Seven fluoroscopic spot views of the left tibia and fibula obtained in the operating room. Placement of tibial intramedullary nail with proximal and distal locking screw fixation traversing distal tibial shaft fracture. Previous hardware in the distal tibia and fibula as well as calcaneus is partially included in the field of view. Proximal fibular fracture is faintly visualized. Fluoroscopy time 1 minutes 14 seconds. Dose 1.95 mGy. IMPRESSION: Intraoperative fluoroscopy during tibial fracture ORIF. Electronically Signed   By: Narda Rutherford M.D.   On: 05/02/2023 17:39    DG C-Arm 1-60 Min-No Report  Result Date: 04/30/2023 Fluoroscopy was utilized by the requesting physician.  No radiographic interpretation.   CT Angio Chest PE W and/or Wo Contrast  Result Date: 04/24/2023 CLINICAL DATA:  82 year old female with history of trauma from a fall. Suspected pulmonary embolism. EXAM: CT ANGIOGRAPHY CHEST WITH CONTRAST TECHNIQUE: Multidetector CT imaging of the chest was performed using the standard protocol during bolus administration of intravenous contrast. Multiplanar CT image reconstructions and MIPs were obtained to evaluate the vascular anatomy. RADIATION DOSE REDUCTION: This exam was performed according to the departmental dose-optimization program which includes automated exposure control, adjustment of the mA and/or kV according to patient size and/or use of iterative reconstruction technique. CONTRAST:  60mL ISOVUE-370 IOPAMIDOL (ISOVUE-370) INJECTION 76% COMPARISON:  No priors. FINDINGS: Cardiovascular: No filling defects are noted within the pulmonary arterial tree to suggest pulmonary embolism. Heart size is normal. Trace amount of pericardial fluid and/or thickening, unlikely to be of any hemodynamic significance at this time. No pericardial calcification. There is aortic atherosclerosis, as well as atherosclerosis of the great vessels of the mediastinum and the coronary arteries, including calcified atherosclerotic plaque in the left anterior descending and left circumflex coronary arteries. Calcifications of the aortic valve. Calcifications of the mitral annulus. Mediastinum/Nodes: No pathologically enlarged mediastinal or hilar lymph nodes. Small hiatal hernia. No axillary lymphadenopathy. Lungs/Pleura: Patchy areas of ground-glass attenuation, septal thickening, thickening of the peribronchovascular interstitium and mild cylindrical bronchiectasis scattered throughout the lungs bilaterally, likely areas of chronic post infectious or inflammatory scarring. No  confluent consolidative airspace disease. No pleural effusions. No pneumothorax. No definite suspicious appearing pulmonary nodules or masses are noted. Upper Abdomen: Aortic atherosclerosis. Musculoskeletal: Postoperative changes of ORIF in the  right proximal humerus partially imaged. There are no aggressive appearing lytic or blastic lesions noted in the visualized portions of the skeleton. Review of the MIP images confirms the above findings. IMPRESSION: 1. No evidence of pulmonary embolism. 2. Trace amount of pericardial fluid and/or thickening, unlikely to be of any hemodynamic significance at this time. No pericardial calcification. 3. Aortic atherosclerosis, in addition to two-vessel coronary artery disease. 4. There are calcifications of the aortic valve and mitral annulus. Echocardiographic correlation for evaluation of potential valvular dysfunction may be warranted if clinically indicated. 5. The appearance of the lungs suggests areas of chronic post infectious or inflammatory scarring, although the possibility of interstitial lung disease is not excluded. Outpatient referral to Pulmonology for further clinical evaluation is recommended, with consideration for follow-up nonemergent outpatient high-resolution chest CT if clinically appropriate. Aortic Atherosclerosis (ICD10-I70.0). Electronically Signed   By: Trudie Reed M.D.   On: Apr 30, 2023 08:15   DG Pelvis Portable  Result Date: April 30, 2023 CLINICAL DATA:  Fall injury with polytrauma. EXAM: RIGHT KNEE - 1-2 VIEW; PORTABLE CHEST - 1 VIEW; PORTABLE PELVIS 1-2 VIEWS; LEFT TIBIA AND FIBULA - 2 VIEW COMPARISON:  PA and lateral chest 04/15/2021, AP pelvis 01/23/2022. No prior right knee films. No prior left tibia fibula film. FINDINGS: Chest AP portable 5:37 a.m.: There is mild cardiomegaly without evidence of CHF. The lungs clear aside from chronic left upper lobe linear scarring. There is mild chronic elevation of the right diaphragm. The  mediastinum is stable with aortic tortuosity and patchy calcification. No pleural effusion or pneumothorax is evident. There is osteopenia with degenerative changes and slight dextroscoliosis of the thoracic spine. No acute osseous findings. 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 Portable AP pelvis single view: No pelvic fracture or diastasis is seen. There is osteopenia. There is mild arthrosis symmetrically at the hips, SI joints, pubic symphysis. Both proximal femurs are grossly intact AP. There is calcification in both common iliac arteries, right pelvic phleboliths. Right knee, AP Lat only: There is osteopenia without evidence of fracture or dislocation. No focal osseous lesion is suspected. 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 There is a linear calcific body posterior to the joint space. Unclear if this is a fabella or an intra-articular loose body. There is no suprapatellar bursal effusion. Superficial soft tissues are unremarkable. Left tibia fibula series, AP and lateral with multiple films obtained: Osteopenia. There is an acute transverse comminuted and mildly impacted fibular neck fracture, with mild spreading of the fragments and no other displacement of this fracture. There is an acute spiral oblique fracture at about the junction of the mid and distal thirds of the tibial shaft, with 1/2 of a shaft width of lateral displacement of the distal fragment and otherwise with no other significant displacement. There is a hairline nondisplaced oblique fracture in the distal tibia distal to the spiral fracture site. There are no further acute fractures. There are 2 old fracture fixation screws inserted through the medial malleolus, two through the posterior calcaneus. There is also a chronic lateral sideplate  fixation of the distal fibular shaft and malleolus. There is moderate secondary arthrosis at the ankle, midfoot collapse partially visible on 1 of the lateral views, and pes planus. There is mild generalized edema in the foreleg and ankle. IMPRESSION: 1. Acute transverse comminuted and mildly impacted fibular neck fracture, with mild  spreading of the fragments. 2. Acute spiral oblique fracture, junction of the mid and distal thirds of the tibial shaft, with 1/2 of a shaft width of lateral displacement of the distal fragment. 3. Hairline nondisplaced oblique fracture in the distal tibia distal to the spiral fracture site. 4. Osteopenia and degenerative change without evidence of right knee fracture or dislocation. 5. No acute chest findings. Chronic changes as above. 6. No evidence of pelvic fracture or diastasis. 7. Aortic and iliac atherosclerosis. 8. Old fracture fixation screws through the medial malleolus, posterior calcaneus, and distal fibular shaft and malleolus. 9. Midfoot collapse partially visible on 1 of the lateral views, and pes planus. Electronically Signed   By: Almira Bar M.D.   On: 04/27/2023 06:34   DG Chest Port 1 View  Result Date: 05/07/2023 CLINICAL DATA:  Fall injury with polytrauma. EXAM: RIGHT KNEE - 1-2 VIEW; PORTABLE CHEST - 1 VIEW; PORTABLE PELVIS 1-2 VIEWS; LEFT TIBIA AND FIBULA - 2 VIEW COMPARISON:  PA and lateral chest 04/15/2021, AP pelvis 01/23/2022. No prior right knee films. No prior left tibia fibula film. FINDINGS: Chest AP portable 5:37 a.m.: There is mild cardiomegaly without evidence of CHF. The lungs clear aside from chronic left upper lobe linear scarring. There is mild chronic elevation of the right diaphragm. The mediastinum is stable with aortic tortuosity and patchy calcification. No pleural effusion or pneumothorax is evident. There is osteopenia with degenerative changes and slight dextroscoliosis of the thoracic spine. No acute osseous findings. 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 Portable AP pelvis single view: No pelvic fracture or diastasis is seen. There is osteopenia. There is mild arthrosis symmetrically at the hips, SI joints, pubic symphysis. Both proximal femurs are grossly intact AP. There is calcification in both common iliac arteries, right pelvic phleboliths. Right knee, AP Lat only: There is osteopenia without evidence of fracture or dislocation. No focal osseous lesion is suspected. 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 There is a linear calcific body posterior to the joint space. Unclear if this is a fabella or an intra-articular loose body. There is no suprapatellar bursal effusion. Superficial soft tissues are unremarkable. Left tibia fibula series, AP and lateral with multiple films obtained: Osteopenia. There is an acute transverse comminuted and mildly impacted fibular neck fracture, with mild spreading of the fragments and no other displacement of this fracture. There is an acute spiral oblique fracture at about the junction of the mid and distal thirds of the tibial shaft, with 1/2 of a shaft width of lateral displacement of the distal fragment and otherwise with no other significant displacement. There is a hairline nondisplaced oblique fracture in the distal tibia distal to the spiral fracture site. There are no further acute fractures. There are 2 old fracture fixation screws inserted through the medial malleolus, two through the posterior calcaneus. There is also a chronic lateral sideplate fixation of the distal fibular shaft and malleolus. There is moderate secondary arthrosis at the ankle, midfoot collapse partially visible on 1 of the lateral views, and pes planus. There is mild generalized edema in the foreleg and ankle. IMPRESSION: 1.  Acute transverse comminuted and mildly impacted fibular neck fracture, with mild spreading of the fragments. 2. Acute spiral oblique fracture, junction of the mid and distal thirds of the tibial shaft, with 1/2 of a shaft width of lateral  displacement of the distal fragment. 3. Hairline nondisplaced oblique fracture in the distal tibia distal to the spiral fracture site. 4. Osteopenia and degenerative change without evidence of right knee fracture or dislocation. 5. No acute chest findings. Chronic changes as above. 6. No evidence of pelvic fracture or diastasis. 7. Aortic and iliac atherosclerosis. 8. Old fracture fixation screws through the medial malleolus, posterior calcaneus, and distal fibular shaft and malleolus. 9. Midfoot collapse partially visible on 1 of the lateral views, and pes planus. Electronically Signed   By: Almira Bar M.D.   On: 05-16-23 06:34   DG Knee 2 Views Right  Result Date: 05-16-2023 CLINICAL DATA:  Fall injury with polytrauma. EXAM: RIGHT KNEE - 1-2 VIEW; PORTABLE CHEST - 1 VIEW; PORTABLE PELVIS 1-2 VIEWS; LEFT TIBIA AND FIBULA - 2 VIEW COMPARISON:  PA and lateral chest 04/15/2021, AP pelvis 01/23/2022. No prior right knee films. No prior left tibia fibula film. FINDINGS: Chest AP portable 5:37 a.m.: There is mild cardiomegaly without evidence of CHF. The lungs clear aside from chronic left upper lobe linear scarring. There is mild chronic elevation of the right diaphragm. The mediastinum is stable with aortic tortuosity and patchy calcification. No pleural effusion or pneumothorax is evident. There is osteopenia with degenerative changes and slight dextroscoliosis of the thoracic spine. No acute osseous findings. 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 Portable AP pelvis single view: No pelvic fracture or diastasis is  seen. There is osteopenia. There is mild arthrosis symmetrically at the hips, SI joints, pubic symphysis. Both proximal femurs are grossly intact AP. There is calcification in both common iliac arteries, right pelvic phleboliths. Right knee, AP Lat only: There is osteopenia without evidence of fracture or dislocation. No focal osseous lesion is suspected. 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 There is a linear calcific body posterior to the joint space. Unclear if this is a fabella or an intra-articular loose body. There is no suprapatellar bursal effusion. Superficial soft tissues are unremarkable. Left tibia fibula series, AP and lateral with multiple films obtained: Osteopenia. There is an acute transverse comminuted and mildly impacted fibular neck fracture, with mild spreading of the fragments and no other displacement of this fracture. There is an acute spiral oblique fracture at about the junction of the mid and distal thirds of the tibial shaft, with 1/2 of a shaft width of lateral displacement of the distal fragment and otherwise with no other significant displacement. There is a hairline nondisplaced oblique fracture in the distal tibia distal to the spiral fracture site. There are no further acute fractures. There are 2 old fracture fixation screws inserted through the medial malleolus, two through the posterior calcaneus. There is also a chronic lateral sideplate fixation of the distal fibular shaft and malleolus. There is moderate secondary arthrosis at the ankle, midfoot collapse partially visible on 1 of the lateral views, and pes planus. There is mild generalized edema in the foreleg and ankle. IMPRESSION: 1. Acute transverse comminuted and mildly impacted fibular neck fracture, with mild spreading of the fragments. 2. Acute spiral oblique fracture, junction of the mid and distal thirds of the tibial shaft, with 1/2 of a shaft width of lateral displacement of  the distal fragment. 3. Hairline nondisplaced oblique fracture in the distal tibia distal to the spiral fracture site. 4. Osteopenia and degenerative change without evidence of  right knee fracture or dislocation. 5. No acute chest findings. Chronic changes as above. 6. No evidence of pelvic fracture or diastasis. 7. Aortic and iliac atherosclerosis. 8. Old fracture fixation screws through the medial malleolus, posterior calcaneus, and distal fibular shaft and malleolus. 9. Midfoot collapse partially visible on 1 of the lateral views, and pes planus. Electronically Signed   By: Almira Bar M.D.   On: 05-17-2023 06:34   DG Tibia/Fibula Left  Result Date: 05/17/23 CLINICAL DATA:  Fall injury with polytrauma. EXAM: RIGHT KNEE - 1-2 VIEW; PORTABLE CHEST - 1 VIEW; PORTABLE PELVIS 1-2 VIEWS; LEFT TIBIA AND FIBULA - 2 VIEW COMPARISON:  PA and lateral chest 04/15/2021, AP pelvis 01/23/2022. No prior right knee films. No prior left tibia fibula film. FINDINGS: Chest AP portable 5:37 a.m.: There is mild cardiomegaly without evidence of CHF. The lungs clear aside from chronic left upper lobe linear scarring. There is mild chronic elevation of the right diaphragm. The mediastinum is stable with aortic tortuosity and patchy calcification. No pleural effusion or pneumothorax is evident. There is osteopenia with degenerative changes and slight dextroscoliosis of the thoracic spine. No acute osseous findings. 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 Portable AP pelvis single view: No pelvic fracture or diastasis is seen. There is osteopenia. There is mild arthrosis symmetrically at the hips, SI joints, pubic symphysis. Both proximal femurs are grossly intact AP. There is calcification in both common iliac arteries, right pelvic phleboliths. Right knee, AP Lat only:  There is osteopenia without evidence of fracture or dislocation. No focal osseous lesion is suspected. 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 There is a linear calcific body posterior to the joint space. Unclear if this is a fabella or an intra-articular loose body. There is no suprapatellar bursal effusion. Superficial soft tissues are unremarkable. Left tibia fibula series, AP and lateral with multiple films obtained: Osteopenia. There is an acute transverse comminuted and mildly impacted fibular neck fracture, with mild spreading of the fragments and no other displacement of this fracture. There is an acute spiral oblique fracture at about the junction of the mid and distal thirds of the tibial shaft, with 1/2 of a shaft width of lateral displacement of the distal fragment and otherwise with no other significant displacement. There is a hairline nondisplaced oblique fracture in the distal tibia distal to the spiral fracture site. There are no further acute fractures. There are 2 old fracture fixation screws inserted through the medial malleolus, two through the posterior calcaneus. There is also a chronic lateral sideplate fixation of the distal fibular shaft and malleolus. There is moderate secondary arthrosis at the ankle, midfoot collapse partially visible on 1 of the lateral views, and pes planus. There is mild generalized edema in the foreleg and ankle. IMPRESSION: 1. Acute transverse comminuted and mildly impacted fibular neck fracture, with mild spreading of the fragments. 2. Acute spiral oblique fracture, junction of the mid and distal thirds of the tibial shaft, with 1/2 of a shaft width of lateral displacement of the distal fragment. 3. Hairline nondisplaced oblique fracture in the distal tibia distal to the spiral fracture site. 4. Osteopenia and degenerative change without evidence of right knee fracture or dislocation. 5. No acute chest findings. Chronic changes  as above. 6. No evidence of pelvic fracture or diastasis. 7. Aortic and iliac atherosclerosis. 8. Old fracture  fixation screws through the medial malleolus, posterior calcaneus, and distal fibular shaft and malleolus. 9. Midfoot collapse partially visible on 1 of the lateral views, and pes planus. Electronically Signed   By: Almira Bar M.D.   On: 2023-04-29 06:34   CT HEAD WO CONTRAST ( )  Result Date: 29-Apr-2023 CLINICAL DATA:  Fall at home.  Blunt poly trauma EXAM: CT HEAD WITHOUT CONTRAST CT CERVICAL SPINE WITHOUT CONTRAST TECHNIQUE: Multidetector CT imaging of the head and cervical spine was performed following the standard protocol without intravenous contrast. Multiplanar CT image reconstructions of the cervical spine were also generated. RADIATION DOSE REDUCTION: This exam was performed according to the departmental dose-optimization program which includes automated exposure control, adjustment of the mA and/or kV according to patient size and/or use of iterative reconstruction technique. COMPARISON:  Brain MRI 01/07/2022 FINDINGS: CT HEAD FINDINGS Brain: No evidence of acute infarction, hemorrhage, hydrocephalus, extra-axial collection or mass lesion/mass effect. Cerebral volume loss and chronic small vessel ischemia that is mild for age. Vascular: No hyperdense vessel or unexpected calcification. Skull: No acute finding Sinuses/Orbits: No evidence of injury.  Right scleral band. CT CERVICAL SPINE FINDINGS Alignment: No traumatic malalignment.  Mild scoliosis Skull base and vertebrae: No acute fracture. No primary bone lesion or focal pathologic process. Soft tissues and spinal canal: No prevertebral fluid or swelling. No visible canal hematoma. Disc levels: Advanced and generalized disc and facet degeneration with diffuse spurring and disc collapse. Endplate sclerosis at multiple levels. 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 Upper chest: No visible injury IMPRESSION: No evidence of acute  intracranial or cervical spine injury. Electronically Signed   By: Tiburcio Pea M.D.   On: 2023-04-29 05:42   CT Cervical Spine Wo Contrast  Result Date: 2023/04/29 CLINICAL DATA:  Fall at home.  Blunt poly trauma EXAM: CT HEAD WITHOUT CONTRAST CT CERVICAL SPINE WITHOUT CONTRAST TECHNIQUE: Multidetector CT imaging of the head and cervical spine was performed following the standard protocol without intravenous contrast. Multiplanar CT image reconstructions of the cervical spine were also generated. RADIATION DOSE REDUCTION: This exam was performed according to the departmental dose-optimization program which includes automated exposure control, adjustment of the mA and/or kV according to patient size and/or use of iterative reconstruction technique. COMPARISON:  Brain MRI 01/07/2022 FINDINGS: CT HEAD FINDINGS Brain: No evidence of acute infarction, hemorrhage, hydrocephalus, extra-axial collection or mass lesion/mass effect. Cerebral volume loss and chronic small vessel ischemia that is mild for age. Vascular: No hyperdense vessel or unexpected calcification. Skull: No acute finding Sinuses/Orbits: No evidence of injury.  Right scleral band. CT CERVICAL SPINE FINDINGS Alignment: No traumatic malalignment.  Mild scoliosis Skull base and vertebrae: No acute fracture. No primary bone lesion or focal pathologic process. Soft tissues and spinal canal: No prevertebral fluid or swelling. No visible canal hematoma. Disc levels: Advanced and generalized disc and facet degeneration with diffuse spurring and disc collapse. Endplate sclerosis at multiple levels. 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 Upper chest: No visible injury IMPRESSION: No evidence of acute intracranial or cervical spine injury. Electronically Signed   By: Tiburcio Pea M.D.   On: 04/29/2023 05:42    Microbiology No results found for this or any previous visit (from the past 240 hour(s)).  Lab Basic Metabolic Panel: No results for input(s):  "NA", "K", "CL", "CO2", "GLUCOSE", "BUN", "CREATININE", "CALCIUM", "MG", "PHOS" in the last 168 hours. Liver Function Tests: No results for input(s): "AST", "ALT", "ALKPHOS", "BILITOT", "PROT", "ALBUMIN" in the last 168 hours. No results  for input(s): "LIPASE", "AMYLASE" in the last 168 hours. No results for input(s): "AMMONIA" in the last 168 hours. CBC: No results for input(s): "WBC", "NEUTROABS", "HGB", "HCT", "MCV", "PLT" in the last 168 hours. Cardiac Enzymes: No results for input(s): "CKTOTAL", "CKMB", "CKMBINDEX", "TROPONINI" in the last 168 hours. Sepsis Labs: No results for input(s): "PROCALCITON", "WBC", "LATICACIDVEN" in the last 168 hours.   Sabrina Baker 05/09/2023, 9:54 AM

## 2023-05-13 NOTE — Progress Notes (Signed)
No charge note  I did not take care of this patient during the current hospitalization.  Patient was admitted on 11/13 by Dr. Katrinka Blazing following fall and left tibial fracture, underwent surgery for same by orthopedics on the same day, postop course was complicated by CODE BLUE/multiple PEA arrests, resuscitated, intubated, admitted to ICU and CCM managed care in the ICU.  Required vasopressors.  Eventually patient demised in the ICU on 11/14 at 4:14 AM.  Although I was the listed attending to pick up her care on 11/14, she demised prior to me seeing her.  Death summary will have to be completed by PCCM MD.  Marcellus Scott, MD,  FACP, [facility], [facility], [facility]   [facility] [place]     To contact the attending provider between 7A-7P or the covering provider during after hours 7P-7A, please log into the web site [email or web address] and access using universal [place] password for that web site. If you do not have the password, please call the hospital operator.

## 2023-05-13 NOTE — Progress Notes (Signed)
Patient has passed. Confirmed with 2H Consulting civil engineer. Going to d/c order.

## 2023-05-13 NOTE — Progress Notes (Signed)
Code blue. Chaplain responds and offers compassionate presence. No family with pt.

## 2023-05-13 NOTE — Progress Notes (Signed)
Pt transported from [facility] to [facility]

## 2023-05-13 NOTE — Progress Notes (Signed)
Cross covering ICU physician.   Called to bedside for repeated pea arrest on this pt recently transferred from floor after multiple arrests as well. (See code sheet for complete details)  Up dated daughter at bedside. She states that pt has previously stated she would not want long term support. Daughter understands with multiple arrests prognosis for return to baseline is likely poor. At this time she would like to change pt to DNR with no further escalation understanding that she may pass. We will refrain from cvc and a line placement at this time. Should her mental status not improve will transition to comfort with compassionate extubation.

## 2023-05-13 NOTE — Progress Notes (Signed)
Code blue. Chaplain responds and provides compassionate presence. Family hasn't arrived.

## 2023-05-13 NOTE — Consult Note (Signed)
NAME:  Sabrina Baker, MRN:  [id number], DOB:  September 27, 1940, LOS: 1 ADMISSION DATE:  04/16/2023, CONSULTATION DATE:  11/14 REFERRING MD:  Dr. Katrinka Blazing TR[facility], CHIEF COMPLAINT:  Cardiac arrest   History of Present Illness:  82 year old female with PMH as below significant for CHF, CKD, DM who presented to [facility] ED 11/13 after a fall complaining of L ankle pain. Uses walker at baseline. In the ED she was hypertensive and febrile. Hypoxemic requiring 5L [place]. 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 Found to have L tibia fracture and was taken to the OR for intramedullary nailing. Procedure was without complication. PCCM called around 0200 11/14 for cardiac arrest. The patient had been resting comfortably with no complaints previously. RN was alerted by telemetry regarding asystole. Upon RN evaluation the patient was unresponsive and pulseless. Underwent 7 minutes ACLS protocol in the PEA/asystole algorithm and 1 round for VT including defibrillation. During transport to the ICU she again went into PEA and had 6 additional minutes of CPR. Upon arrival to ICU ROSC was achieved. She was bradycardic and hypotensive. NE infusion was started.   Pertinent  Medical History   has a past medical history of Acute diastolic congestive heart failure (HCC), Breast cancer (HCC), Carotid stenosis, Chronic kidney disease, Diabetes mellitus without complication (HCC), Gout, Hypercholesteremia, Hypertension, Neuromuscular disorder (HCC), Osteopenia, Retinal detachment, Spinal stenosis, TIA (transient ischemic attack), and Vitamin B12 deficiency.   Significant Hospital Events: Including procedures, antibiotic start and stop dates in addition to other pertinent events   11/13 admit with L tib fx > to OR.  11/14 cardiac arrest 13 mins > to ICU.   Interim History / Subjective:    Objective   Blood pressure (!) 112/52, pulse 62, temperature 99.1 F (37.3 C), temperature  source Oral, resp. rate (!) 27, height 5\' 3"  (1.6 m), weight 74.4 kg, SpO2 95%.    Vent Mode: PRVC FiO2 (%):  [100 %] 100 % Set Rate:  [26 bmp] 26 bmp Vt Set:  [420 mL] 420 mL PEEP:  [5 cmH20] 5 cmH20   Intake/Output Summary (Last 24 hours) at 05/07/2023 0237 Last data filed at 04/18/2023 0221 Gross per 24 hour  Intake 1240 ml  Output 100 ml  Net 1140 ml   Filed Weights   04/27/2023 1700  Weight: 74.4 kg    Examination: General: elderly female in NAD HENT: [employer]/AT, PERRL, no JVD Lungs: Rhonchi bilaterally. + Lung sliding on ultrasound. Pink frothy secretions in ETT.  Cardiovascular: RRR, no MRG. LV function OK. RV not dilated. No effusion.  Abdomen: Soft, NT, ND Extremities: No acute deformity. L ankle wrap in place.  Neuro: Coma GU: Foley  Resolved Hospital Problem list     Assessment & Plan:   Cardiac arrest: etiology uncertain. CAD history, MI? Pulmonary edema on imaging, perhaps a primary respiratory arrest? Possibly related to narcotics? EKG without evidence of ischemia. Fat embolism? - EKG - Troponin - EEG per protocol - Repeat echocardiogram - avoid fever with TTM protocol, currently hypothermic - On levo post arrest but rapidly weaning off. MAP goal 65  Closed L tibia fracture: s/p intramedullary nailing 11/13 - management per Dr. Jena Gauss  Acute respiratory failure with hypoxia CAP - Full vent support - CXR and ABG - VAP bundle  - Continue CAP coverage  Atrial fib  - Eliquis hold - Can initiate heparin in AM if indicated otherwise can restart eliquis.   HTN  HLD - Holding amlodipine, coreg  DM - CBG monitoring and SSI - Semglee  CKD - Trend  Best Practice (right click and "Reselect all SmartList Selections" daily)   Diet/type: NPO DVT prophylaxis: DOAC GI prophylaxis: PPI Lines: N/A Foley:  Yes, and it is still needed Code Status:  full code Last date of multidisciplinary goals of care discussion [ ]   Labs   CBC: Recent Labs  Lab  04/21/2023 0502 05/06/2023 0513  WBC 12.6*  --   NEUTROABS 9.9*  --   HGB 13.4 14.3  HCT 42.0 42.0  MCV 100.5*  --   PLT 238  --     Basic Metabolic Panel: Recent Labs  Lab 04/21/2023 0502 04/19/2023 0513  NA 134* 132*  K 5.2* 5.2*  CL 100 105  CO2 26  --   GLUCOSE 236* 235*  BUN 38* 47*  CREATININE 1.26* 1.20*  CALCIUM 9.6  --    GFR: Estimated Creatinine Clearance: 35.5 mL/min (A) (by C-G formula based on SCr of 1.2 mg/dL (H)). Recent Labs  Lab 05/01/2023 0502 05/09/2023 0519 04/15/2023 0829  PROCALCITON  --   --  <0.10  WBC 12.6*  --   --   LATICACIDVEN  --  1.0  --     Liver Function Tests: Recent Labs  Lab 04/19/2023 0502  AST 18  ALT 14  ALKPHOS 65  BILITOT 0.5  PROT 6.5  ALBUMIN 3.3*   No results for input(s): "LIPASE", "AMYLASE" in the last 168 hours. No results for input(s): "AMMONIA" in the last 168 hours.  ABG    Component Value Date/Time   PHART 7.467 (H) 09/28/2018 0435   PCO2ART 47.0 09/28/2018 0435   PO2ART 69.0 (L) 09/28/2018 0435   HCO3 33.9 (H) 09/28/2018 0435   TCO2 27 04/24/2023 0513   ACIDBASEDEF 2.0 09/11/2018 0401   O2SAT 94.0 09/28/2018 0435     Coagulation Profile: Recent Labs  Lab 04/18/2023 0502  INR 1.0    Cardiac Enzymes: No results for input(s): "CKTOTAL", "CKMB", "CKMBINDEX", "TROPONINI" in the last 168 hours.  HbA1C: Hgb A1c MFr Bld  Date/Time Value Ref Range Status  04/29/2023 06:19 PM 8.3 (H) 4.8 - 5.6 % Final    Comment:    (NOTE) Pre diabetes:          5.7%-6.4%  Diabetes:              >6.4%  Glycemic control for   <7.0% adults with diabetes   03/04/2021 03:55 AM 8.1 (H) 4.8 - 5.6 % Final    Comment:    (NOTE) Pre diabetes:          5.7%-6.4%  Diabetes:              >6.4%  Glycemic control for   <7.0% adults with diabetes     CBG: Recent Labs  Lab 05/05/2023 1535 04/24/2023 1708 04/15/2023 2006 05/10/2023 0151 04/29/2023 0154  GLUCAP 138* 116* 263* 326* 319*    Review of Systems:   Patient is  encephalopathic and/or intubated; therefore, history has been obtained from chart review.    Past Medical History:  She,  has a past medical history of Acute diastolic congestive heart failure (HCC), Breast cancer (HCC), Carotid stenosis, Chronic kidney disease, Diabetes mellitus without complication (HCC), Gout, Hypercholesteremia, Hypertension, Neuromuscular disorder (HCC), Osteopenia, Retinal detachment, Spinal stenosis, TIA (transient ischemic attack), and Vitamin B12 deficiency.   Surgical History:   Past Surgical History:  Procedure Laterality Date   54 GAUGE  PARS PLANA VITRECTOMY WITH 20 GAUGE MVR PORT Right 03/16/2015   Procedure: 25 GAUGE PARS PLANA VITRECTOMY WITH 23 GAUGE MVR PORT;  Surgeon: Sherrie George, MD;  Location: [facility] OR;  Service: Ophthalmology;  Laterality: Right;   ANKLE SURGERY     BREAST BIOPSY Right 06/28/2022   [place] RT BREAST BX W LOC DEV 1ST LESION IMG BX SPEC US GUIDE 06/28/2022 [facility]   BREAST BIOPSY  08/02/2022   MM RT RADIOACTIVE SEED LOC MAMMO GUIDE 08/02/2022 [facility]   BREAST EXCISIONAL BIOPSY     BREAST LUMPECTOMY     BREAST LUMPECTOMY WITH RADIOACTIVE SEED LOCALIZATION Right 01/24/2016   Procedure: RIGHT BREAST LUMPECTOMY WITH RADIOACTIVE SEED LOCALIZATION;  Surgeon: Abigail Miyamoto, MD;  Location: [facility];  Service: General;  Laterality: Right;   BREAST LUMPECTOMY WITH RADIOACTIVE SEED LOCALIZATION Right 08/03/2022   Procedure: RIGHT BREAST LUMPECTOMY WITH RADIOACTIVE SEED LOCALIZATION;  Surgeon: Abigail Miyamoto, MD;  Location: [facility];  Service: General;  Laterality: Right;  LMA   EYE SURGERY     detached retna X2   HEEL SPUR SURGERY  2012   INCISION AND DRAINAGE ABSCESS Right 11/21/2019   Procedure: INCISION AND DRAINAGE FOREHEAD & RIGHT EAR ABSCESS;  Surgeon: Osborn Coho, MD;  Location: [facility] OR;  Service: ENT;  Laterality: Right;   INJECTION OF SILICONE OIL Right 03/16/2015   Procedure:  Extraction OF SILICONE OIL;  Surgeon: Sherrie George, MD;  Location: [facility] OR;  Service: Ophthalmology;  Laterality: Right;   ORIF ANKLE FRACTURE Left 06/17/2014   Procedure: OPEN REDUCTION INTERNAL FIXATION (ORIF) LEFT BIMALLEOLAR ANKLE FRACTURE;  Surgeon: Cheral Almas, MD;  Location: [facility] OR;  Service: Orthopedics;  Laterality: Left;   PHOTOCOAGULATION WITH LASER Right 03/16/2015   Procedure: PHOTOCOAGULATION WITH LASER;  Surgeon: Sherrie George, MD;  Location[facility] OR;  Service: Ophthalmology;  Laterality: Right;   SHOULDER ARTHROSCOPY Right    plates and screws   VITRECTOMY Right 03/16/2015   WRIST ARTHROPLASTY Left    plates and screws and bone graft     Social History:   reports that she has never smoked. She has been exposed to tobacco smoke. She has never used smokeless tobacco. She reports that she does not drink alcohol and does not use drugs.   Family History:  Her family history is not on file.   Allergies Allergies  Allergen Reactions   Ramipril Anaphylaxis and Other (See Comments)   Cortisone Itching and Rash   Niacin Diarrhea and Nausea Only   Niacin And Related Diarrhea   Valsartan Other (See Comments)    Hyperkalemia      Home Medications  Prior to Admission medications   Medication Sig Start Date End Date Taking? Authorizing Provider  acetaminophen (TYLENOL) 650 MG CR tablet Take 1,300 mg by mouth every 8 (eight) hours as needed for pain.   Yes [provider]  amLODipine (NORVASC) 2.5 MG tablet Take 2.5 mg by mouth daily. 11/20/22  Yes [provider]  anastrozole (ARIMIDEX) 1 MG tablet Take 1 tablet (1 mg total) by mouth daily. 08/14/22  Yes Serena Croissant, MD  atorvastatin (LIPITOR) 10 MG tablet Take 10 mg by mouth daily.   Yes [provider]  bevacizumab (AVASTIN) 400 MG/16ML SOLN Inject into the vein.   Yes [provider]  brimonidine (ALPHAGAN P) 0.1 % SOLN Place 1 drop into the left eye 2 (two) times daily.   Yes  [provider]  calcitRIOL (  ROCALTROL) 0.25 MCG capsule Take 0.25 mcg by mouth 3 (three) times a week. Paulo Fruit, Friday 12/17/20  Yes [provider]  calcium-vitamin D (OSCAL WITH D) 500-200 MG-UNIT TABS tablet Take 1 tablet by mouth daily.    Yes [provider]  carvedilol (COREG) 25 MG tablet Take 25 mg by mouth 2 (two) times daily with a meal.   Yes [provider]  ciprofloxacin (CILOXAN) 0.3 % ophthalmic solution SMARTSIG:In Eye(s) 11/28/22  Yes [provider]  colchicine 0.6 MG tablet Take 0.6 mg by mouth once as needed (As needed).   Yes [provider]  cyanocobalamin 100 MCG tablet Take 100 mcg by mouth daily.   Yes [provider]  diclofenac Sodium (VOLTAREN) 1 % GEL Apply topically 4 (four) times daily. As needed   Yes [provider]  DULoxetine (CYMBALTA) 60 MG capsule Take 60 mg by mouth daily. 07/03/18  Yes [provider]  ELIQUIS 2.5 MG TABS tablet TAKE 1 TABLET BY MOUTH TWICE A DAY 04/18/23  Yes Nahser, Deloris Ping, MD  Ferrous Sulfate (IRON) 325 (65 FE) MG TABS Take 1 tablet by mouth 2 (two) times daily.   Yes [provider]  furosemide (LASIX) 20 MG tablet Take 20 mg by mouth daily. 01/18/23  Yes [provider]  gabapentin (NEURONTIN) 400 MG capsule Take 1 capsule (400 mg total) by mouth 3 (three) times daily. Can take 2x/day- can increase if need be, to a max of 400 mg 3x/day- for nerve pain 04/11/23  Yes Lovorn, Aundra Millet, MD  HYDROcodone-acetaminophen (NORCO) 7.5-325 MG tablet Take 1 tablet by mouth every 8 (eight) hours as needed for moderate pain (pain score 4-6). 04/11/23  Yes Lovorn, Aundra Millet, MD  ketoconazole (NIZORAL) 2 % shampoo Apply 1 application topically 2 (two) times a week.  02/27/19  Yes [provider]  pantoprazole (PROTONIX) 40 MG tablet Take 40 mg by mouth 2 (two) times daily.   Yes [provider]  TOUJEO SOLOSTAR 300 UNIT/ML Solostar Pen Inject 20 Units  into the skin daily. 04/04/23  Yes [provider]  trimethoprim-polymyxin b (POLYTRIM) ophthalmic solution Use 4 times in left eye the day of injection, 4 drop the following day.   Yes [provider]  TRULICITY 3 MG/0.5ML SOAJ Inject 3 mg into the skin once a week. Wednesday.   Yes [provider]  VELTASSA 8.4 g packet Take 8.4 g by mouth daily. 02/26/23  Yes [provider]  nateglinide (STARLIX) 60 MG tablet Take by mouth. 04/04/23   [provider]     Critical care time: 58 minutes       Joneen Roach, AGACNP-BC [facility]  See Amion for personal pager PCCM on call pager [phone] until 7pm. Please call Elink 7p-7a. [phone]  04/24/2023 3:15 AM

## 2023-05-13 NOTE — Progress Notes (Signed)
Daughter, Alvis Lemmings, aware of transfer to [facility] and is on the way here.

## 2023-05-13 NOTE — Progress Notes (Signed)
Code blue. Daughter Alvis Lemmings has arrived and chaplain provides support and compassionate presence. [name] makes patient comfort care.

## 2023-05-13 NOTE — Code Documentation (Signed)
  Patient Name: Sabrina Baker   MRN: [id number]   Date of Birth/ Sex: [birth year] , female      Admission Date: 04/17/2023  Attending Provider: Clydie Braun, MD  Primary Diagnosis: Fracture of tibial shaft, closed   Indication: Pt was in her usual state of health until this PM, when she was noted to be in asystole per telemetry monitoring. Code blue was subsequently called. At the time of arrival on scene, ACLS protocol was underway.   Technical Description:  - CPR performance duration:  8 minutes  - Was defibrillation or cardioversion used? Yes   - Was external pacer placed? No  - Was patient intubated pre/post CPR? Yes   En route, patient was noted to be gradually bradycardic. She was determined to be in PEA arrest. CPR was again initiated.  Technical Description:  - CPR performance duration:  5 minutes  - Was defibrillation or cardioversion used? No   - Was external pacer placed? No  - Was patient intubated pre/post CPR? Yes    Medications Administered: Y = Yes; Blank = No Amiodarone    Atropine  Y  Calcium  Y  Epinephrine  Y  Lidocaine    Magnesium    Norepinephrine    Phenylephrine    Sodium bicarbonate    Vasopressin     Patient also given narcan x 1.  Post CPR evaluation:  - Final Status - Was patient successfully resuscitated ? Yes - What is current rhythm? Sinus bradycardia - What is current hemodynamic status? Hypotensive; PCCM starting vasopressor and giving additional IVF  Miscellaneous Information:  - Labs sent, including: No  - Primary team notified?  Yes  - Family Notified? Yes  - Additional notes/ transfer status: Ongoing care by PCCM team.     Champ Mungo, DO  05/12/2023, 2:44 AM

## 2023-05-13 DEATH — deceased

## 2023-05-29 ENCOUNTER — Ambulatory Visit: Payer: 59 | Admitting: Physical Medicine & Rehabilitation

## 2023-06-09 MED FILL — Medication: Qty: 1 | Status: AC

## 2023-06-11 ENCOUNTER — Other Ambulatory Visit (HOSPITAL_COMMUNITY): Payer: 59

## 2023-06-20 ENCOUNTER — Ambulatory Visit: Payer: 59 | Admitting: Registered Nurse

## 2023-06-27 ENCOUNTER — Other Ambulatory Visit: Payer: 59

## 2023-08-31 IMAGING — CR DG CHEST 2V
2 series · 2 of 2 positions shown · non-contrast
Comparison: March 20, 2021

CLINICAL DATA: Acute on chronic diastolic heart failure (HCC)

EXAM:
CHEST - 2 VIEW

[w chest pa]
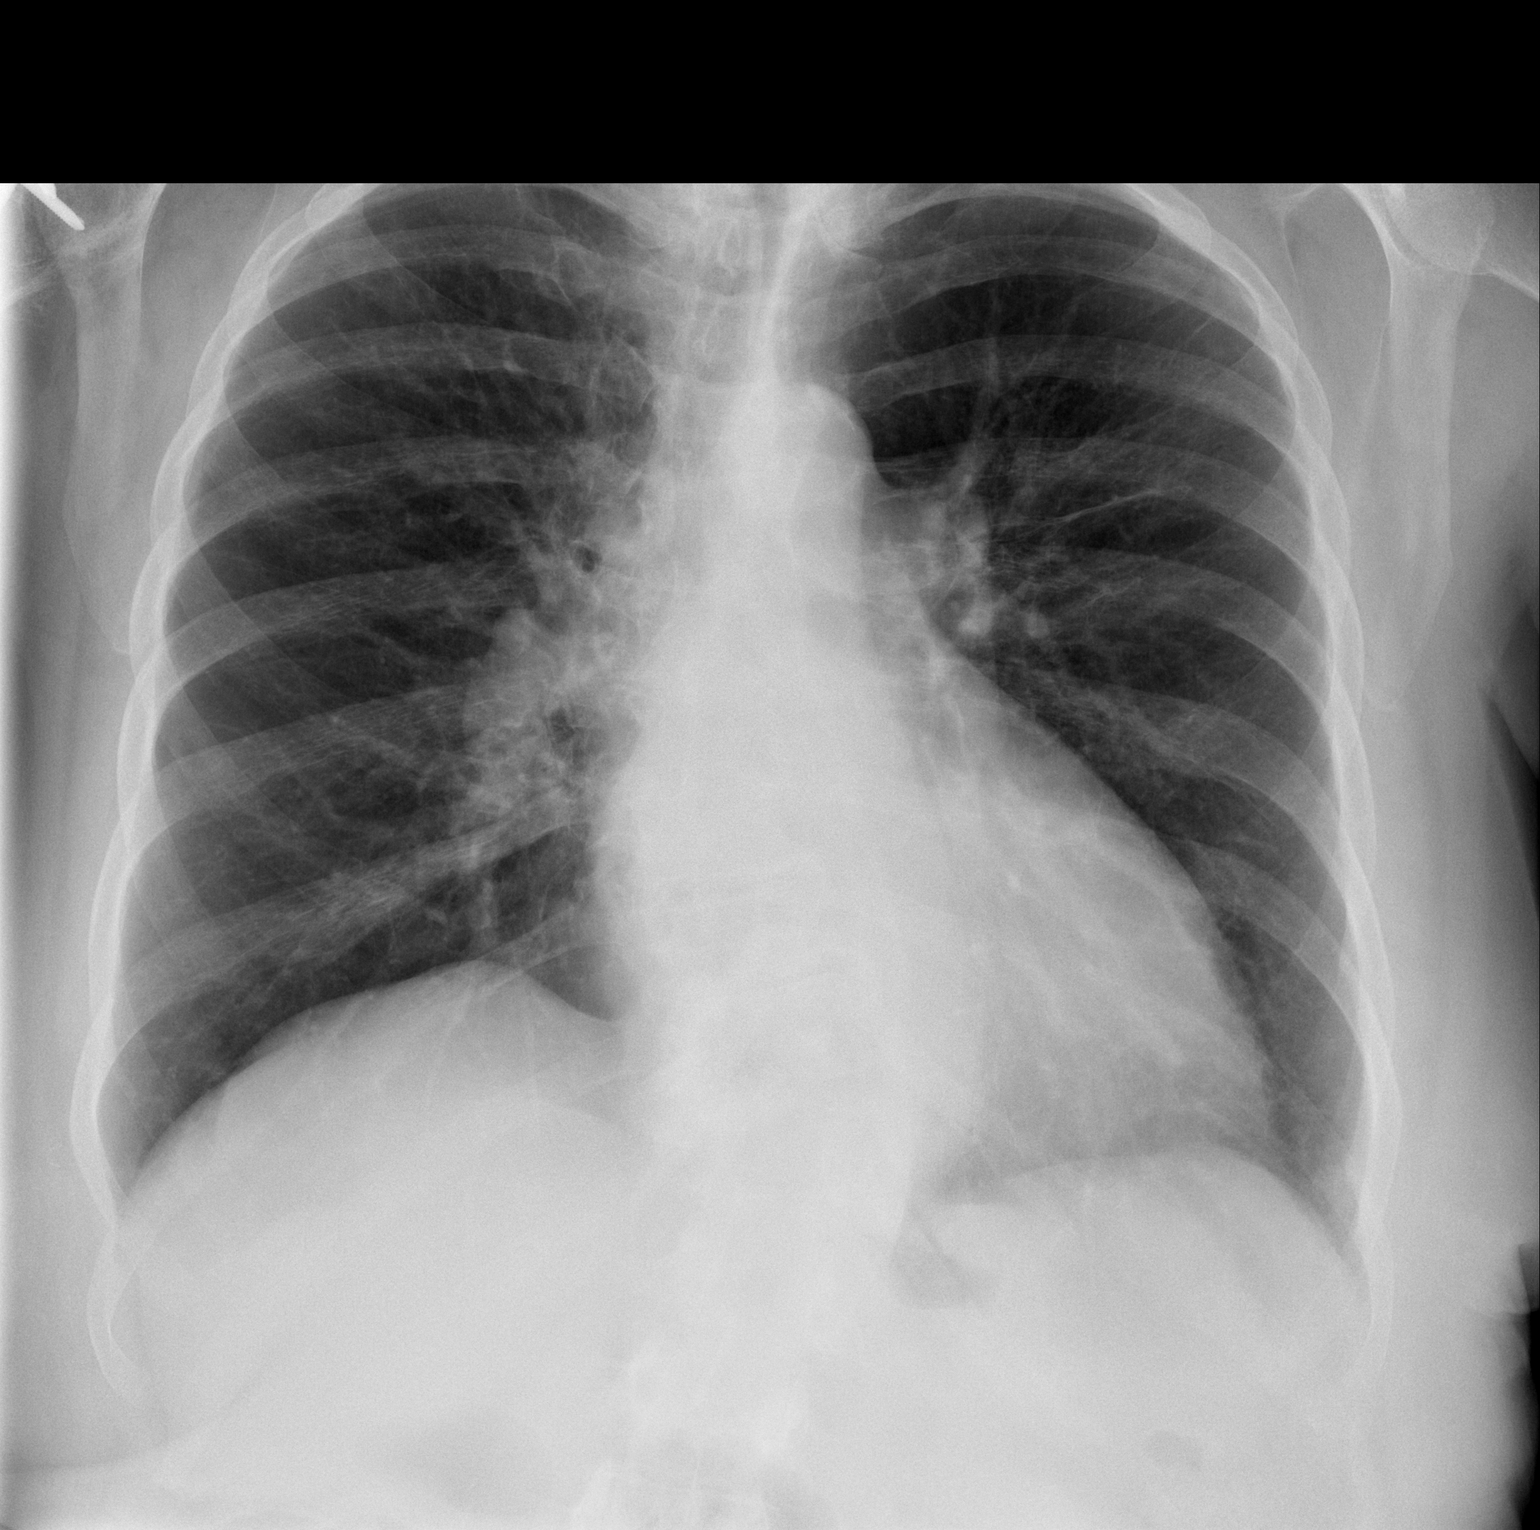

[w chest lat]
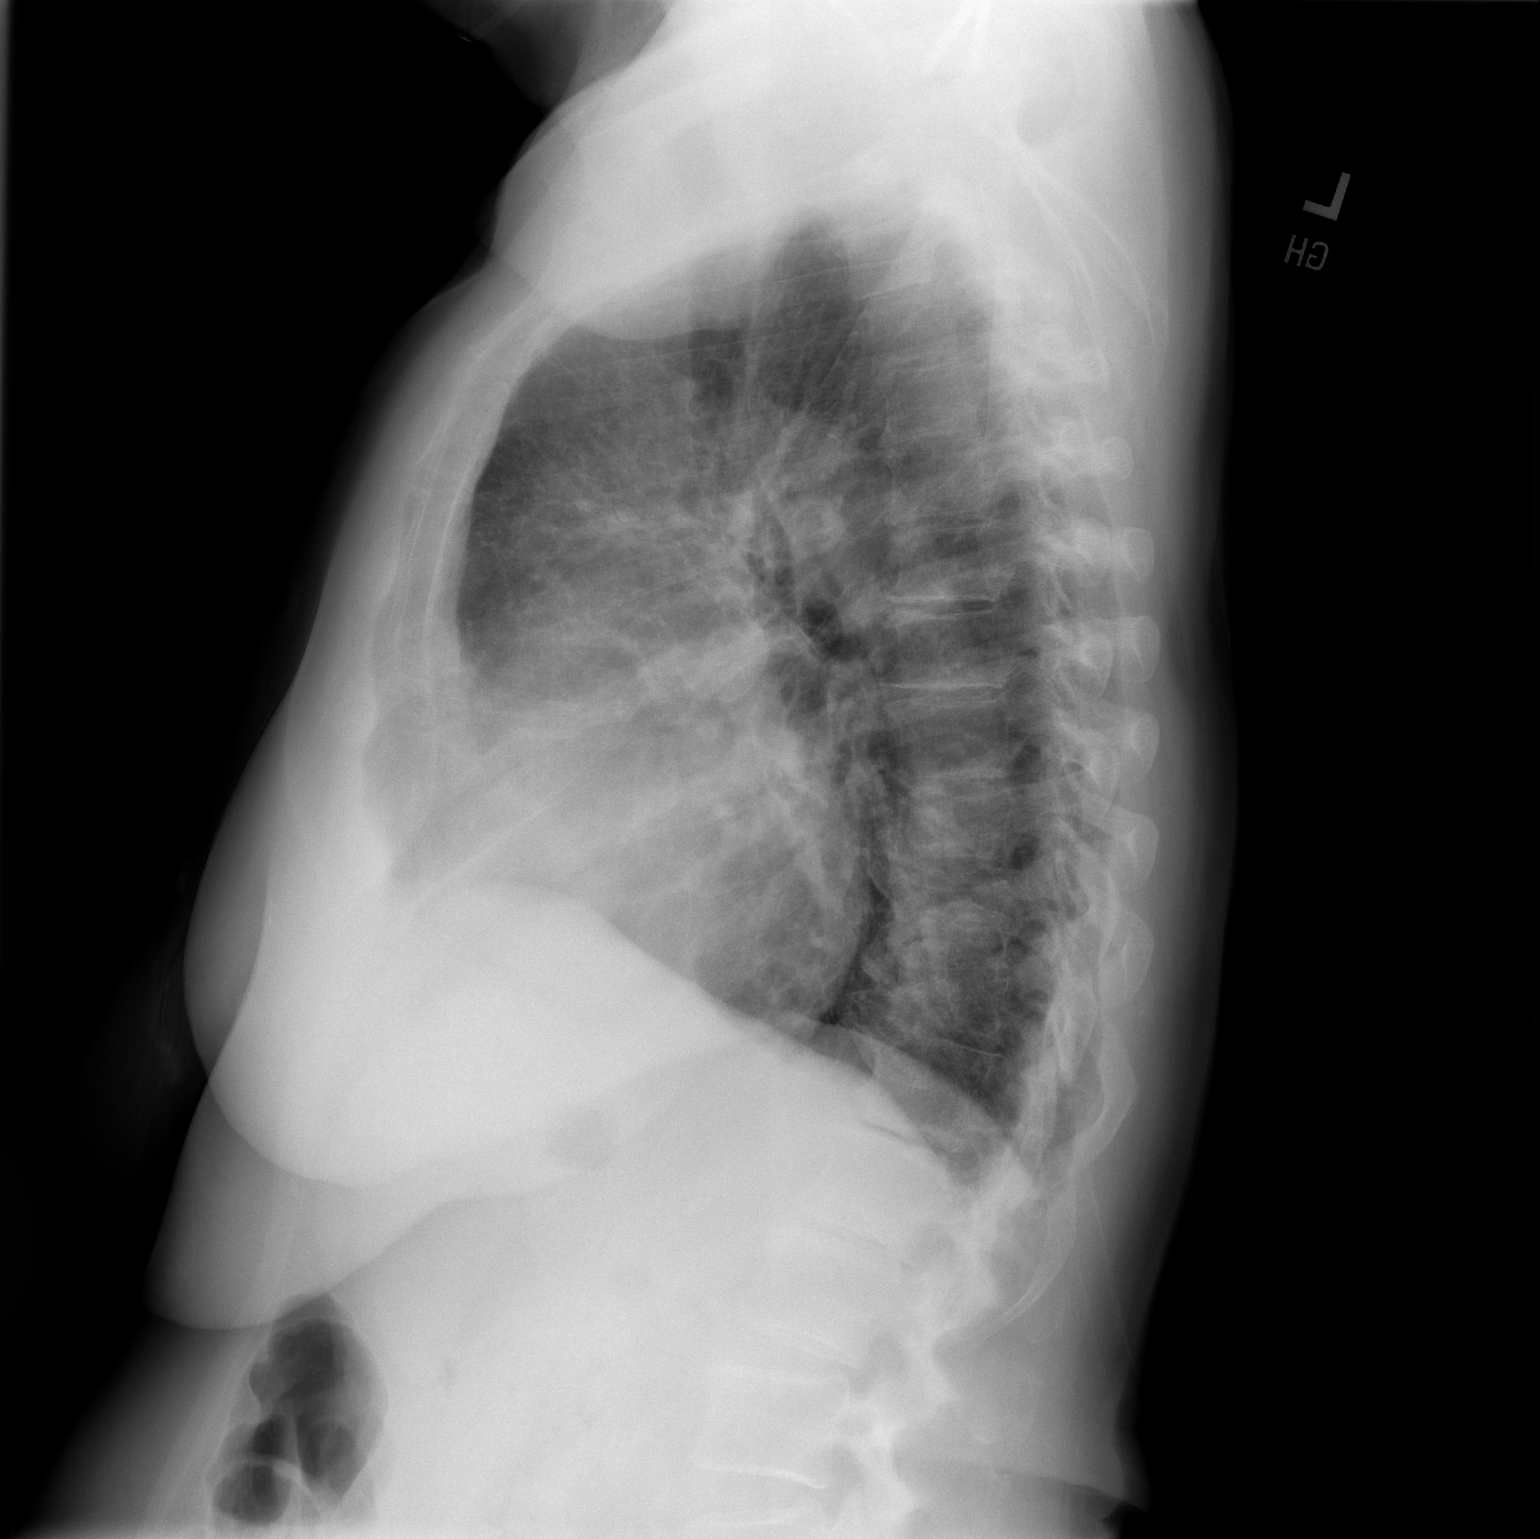

[2 of 2 positions shown; findings below may reference images not displayed]

FINDINGS: The cardiomediastinal silhouette is unchanged and enlarged in
contour.Unchanged enlargement of bilateral pulmonary arteries.
Atherosclerotic calcifications of the tortuous thoracic aorta. No
pleural effusion. No pneumothorax. No acute pleuroparenchymal
abnormality. Visualized abdomen is unremarkable. Thoracolumbar
scoliosis.
IMPRESSION: No acute cardiopulmonary abnormality.

## 2023-10-10 ENCOUNTER — Ambulatory Visit: Payer: 59 | Admitting: Physical Medicine and Rehabilitation

## 2023-12-26 ENCOUNTER — Ambulatory Visit: Payer: 59 | Admitting: Internal Medicine

## 2024-01-10 IMAGING — CR DG ABDOMEN 2V
3 series · 3 of 3 positions shown · non-contrast
Comparison: CT August 26, 2021

CLINICAL DATA: Abdominal pain

EXAM:
ABDOMEN - 2 VIEW

[t abdomen supine (1 of 2)]
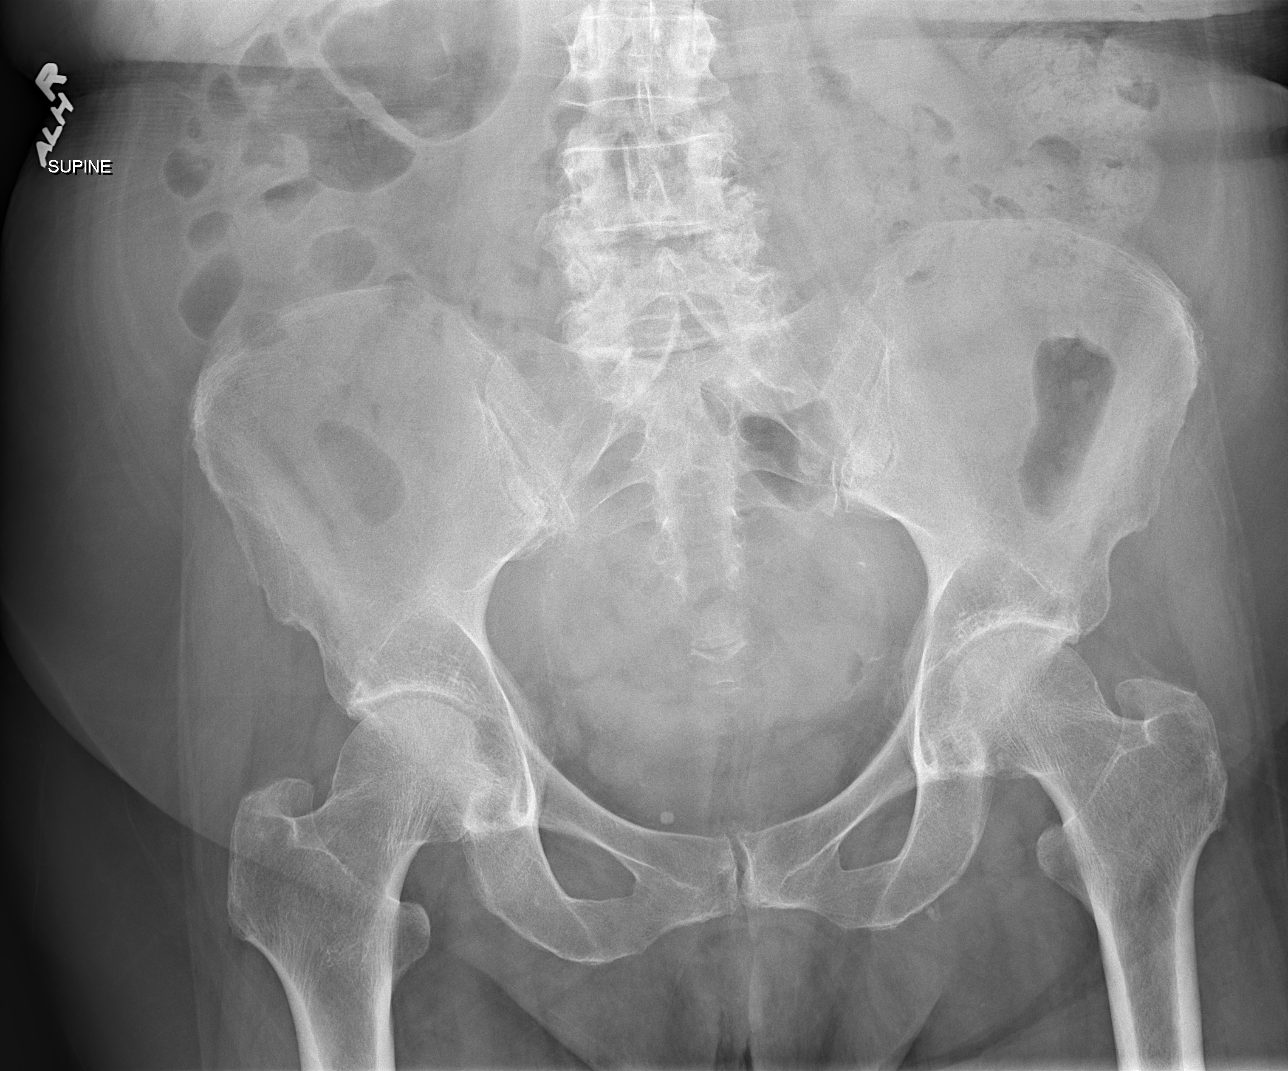

[t abdomen supine (2 of 2)]
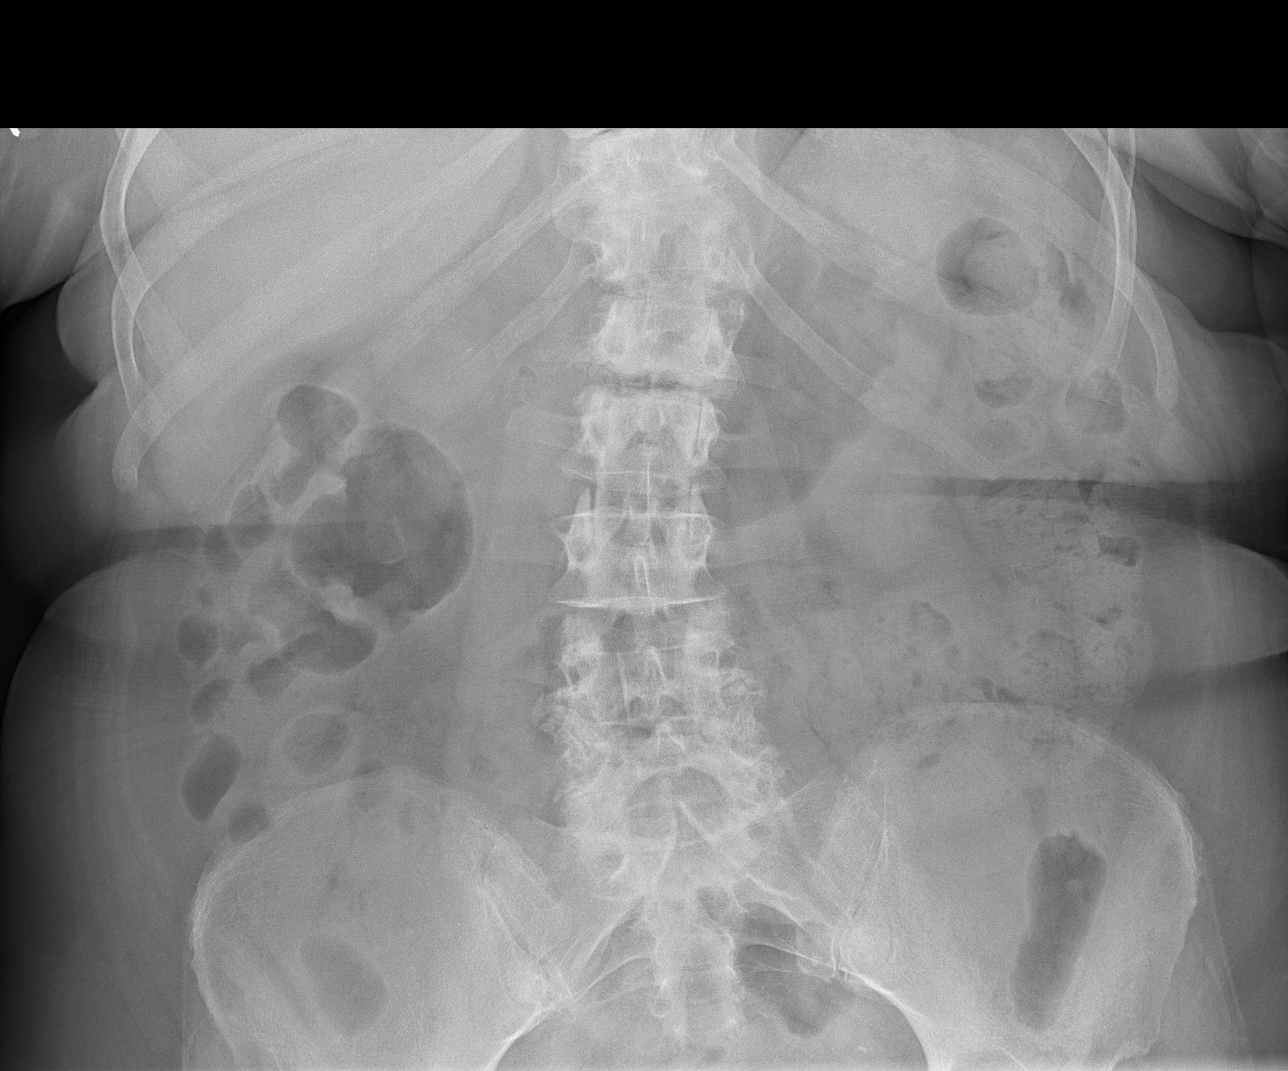

[w abdomen upright]
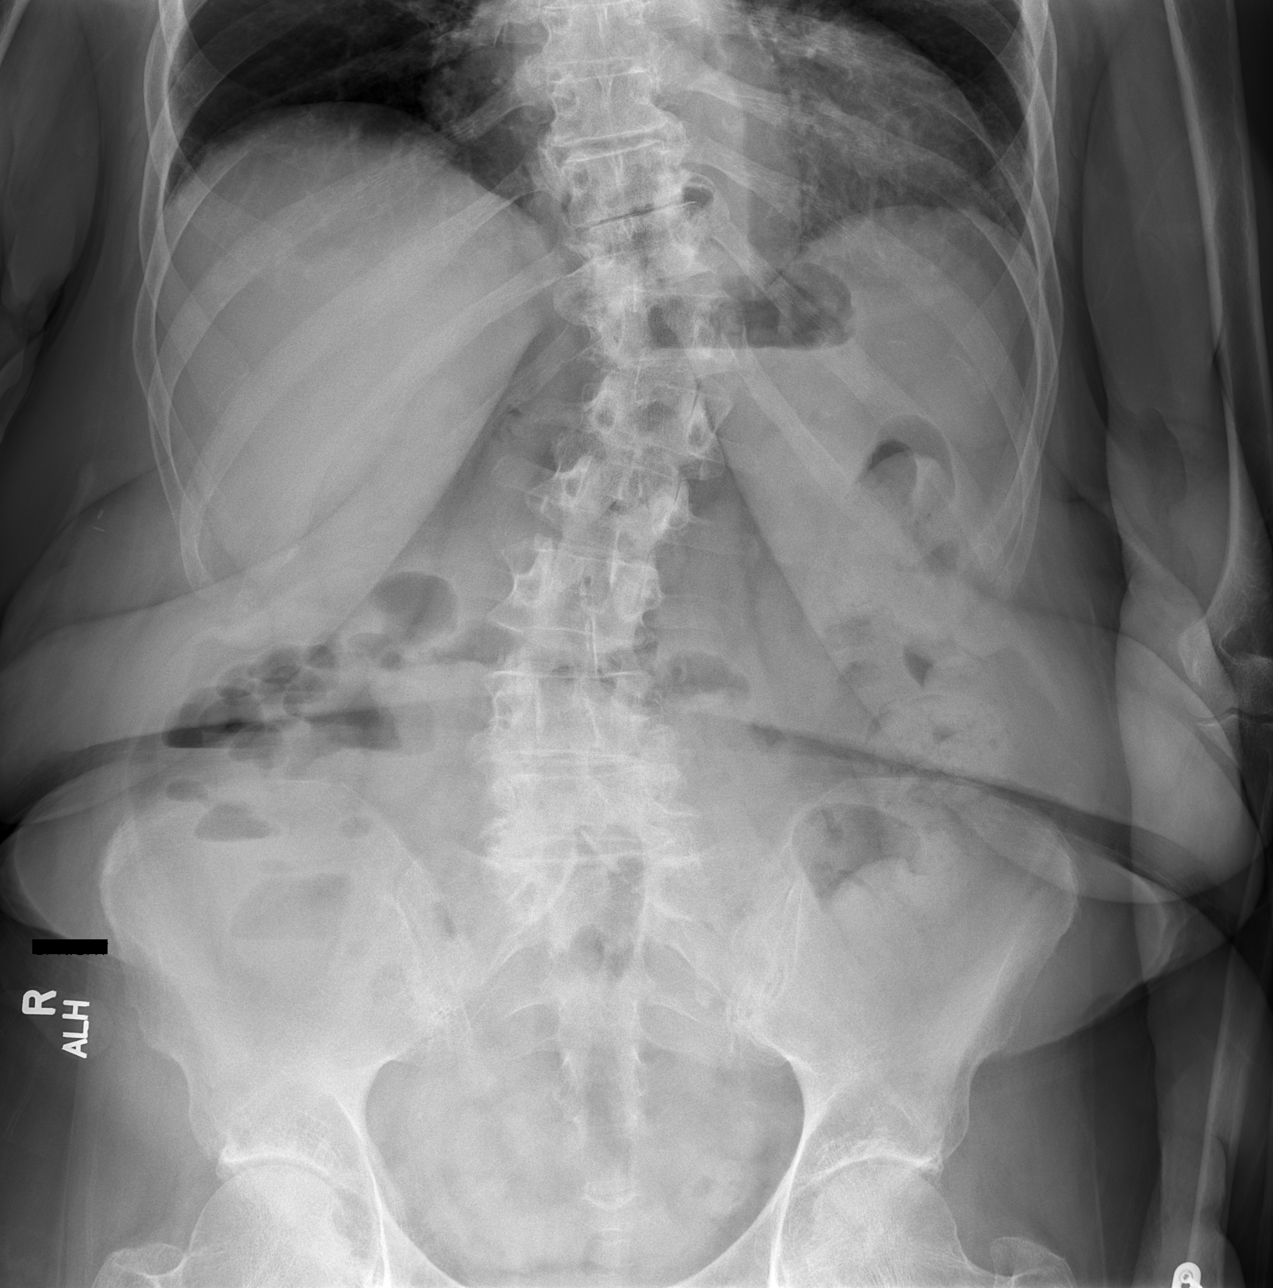

[3 of 3 positions shown; findings below may reference images not displayed]

FINDINGS: The bowel gas pattern is normal. There is no evidence of free air.
Small volume formed stool. Pelvic phleboliths. Lumbar spondylosis.
Degenerative change of the hips.
IMPRESSION: Nonobstructive bowel gas pattern. Small volume formed stool.
